# Patient Record
Sex: Male | Born: 1940 | Race: White | Hispanic: No | Marital: Married | Smoking: Former smoker
Health system: Southern US, Community
[De-identification: ages and names within clinical notes are randomized; demographics above are authoritative.]

## PROBLEM LIST (undated history)

## (undated) DIAGNOSIS — K602 Anal fissure, unspecified: Secondary | ICD-10-CM

## (undated) DIAGNOSIS — N209 Urinary calculus, unspecified: Secondary | ICD-10-CM

## (undated) DIAGNOSIS — I519 Heart disease, unspecified: Secondary | ICD-10-CM

## (undated) DIAGNOSIS — E109 Type 1 diabetes mellitus without complications: Secondary | ICD-10-CM

## (undated) DIAGNOSIS — I10 Essential (primary) hypertension: Secondary | ICD-10-CM

## (undated) DIAGNOSIS — G4730 Sleep apnea, unspecified: Secondary | ICD-10-CM

## (undated) DIAGNOSIS — T7840XA Allergy, unspecified, initial encounter: Secondary | ICD-10-CM

## (undated) DIAGNOSIS — N62 Hypertrophy of breast: Secondary | ICD-10-CM

## (undated) DIAGNOSIS — E785 Hyperlipidemia, unspecified: Secondary | ICD-10-CM

## (undated) DIAGNOSIS — K760 Fatty (change of) liver, not elsewhere classified: Secondary | ICD-10-CM

## (undated) DIAGNOSIS — M1990 Unspecified osteoarthritis, unspecified site: Secondary | ICD-10-CM

## (undated) DIAGNOSIS — R609 Edema, unspecified: Secondary | ICD-10-CM

## (undated) DIAGNOSIS — M109 Gout, unspecified: Secondary | ICD-10-CM

## (undated) DIAGNOSIS — Z860100 Personal history of colon polyps, unspecified: Secondary | ICD-10-CM

## (undated) DIAGNOSIS — E291 Testicular hypofunction: Secondary | ICD-10-CM

## (undated) DIAGNOSIS — K222 Esophageal obstruction: Secondary | ICD-10-CM

## (undated) DIAGNOSIS — R011 Cardiac murmur, unspecified: Secondary | ICD-10-CM

## (undated) DIAGNOSIS — E1042 Type 1 diabetes mellitus with diabetic polyneuropathy: Secondary | ICD-10-CM

## (undated) DIAGNOSIS — M545 Low back pain: Secondary | ICD-10-CM

## (undated) DIAGNOSIS — K219 Gastro-esophageal reflux disease without esophagitis: Secondary | ICD-10-CM

## (undated) DIAGNOSIS — G4733 Obstructive sleep apnea (adult) (pediatric): Secondary | ICD-10-CM

## (undated) DIAGNOSIS — N259 Disorder resulting from impaired renal tubular function, unspecified: Secondary | ICD-10-CM

## (undated) DIAGNOSIS — D649 Anemia, unspecified: Secondary | ICD-10-CM

## (undated) HISTORY — PX: POLYPECTOMY: SHX149

## (undated) HISTORY — DX: Type 1 diabetes mellitus without complications: E10.9

## (undated) HISTORY — DX: Unspecified osteoarthritis, unspecified site: M19.90

## (undated) HISTORY — DX: Personal history of colonic polyps: Z86.010

## (undated) HISTORY — DX: Sleep apnea, unspecified: G47.30

## (undated) HISTORY — DX: Cardiac murmur, unspecified: R01.1

## (undated) HISTORY — DX: Anal fissure, unspecified: K60.2

## (undated) HISTORY — DX: Fatty (change of) liver, not elsewhere classified: K76.0

## (undated) HISTORY — DX: Heart disease, unspecified: I51.9

## (undated) HISTORY — DX: Low back pain: M54.5

## (undated) HISTORY — DX: Allergy, unspecified, initial encounter: T78.40XA

## (undated) HISTORY — DX: Gout, unspecified: M10.9

## (undated) HISTORY — DX: Anemia, unspecified: D64.9

## (undated) HISTORY — DX: Edema, unspecified: R60.9

## (undated) HISTORY — DX: Type 1 diabetes mellitus with diabetic polyneuropathy: E10.42

## (undated) HISTORY — DX: Testicular hypofunction: E29.1

## (undated) HISTORY — DX: Essential (primary) hypertension: I10

## (undated) HISTORY — DX: Disorder resulting from impaired renal tubular function, unspecified: N25.9

## (undated) HISTORY — DX: Hyperlipidemia, unspecified: E78.5

## (undated) HISTORY — PX: OTHER SURGICAL HISTORY: SHX169

## (undated) HISTORY — DX: Urinary calculus, unspecified: N20.9

## (undated) HISTORY — DX: Hypertrophy of breast: N62

## (undated) HISTORY — DX: Personal history of colon polyps, unspecified: Z86.0100

## (undated) HISTORY — DX: Obstructive sleep apnea (adult) (pediatric): G47.33

## (undated) HISTORY — DX: Morbid (severe) obesity due to excess calories: E66.01

## (undated) HISTORY — DX: Esophageal obstruction: K22.2

## (undated) HISTORY — DX: Gastro-esophageal reflux disease without esophagitis: K21.9

---
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
## 1978-10-23 HISTORY — PX: GANGLION CYST EXCISION: SHX1691

## 1997-09-22 HISTORY — PX: OTHER SURGICAL HISTORY: SHX169

## 1999-05-25 ENCOUNTER — Other Ambulatory Visit: Admission: RE | Admit: 1999-05-25 | Discharge: 1999-05-25 | Payer: Self-pay | Admitting: Gastroenterology

## 1999-05-25 ENCOUNTER — Encounter (INDEPENDENT_AMBULATORY_CARE_PROVIDER_SITE_OTHER): Payer: Self-pay | Admitting: Specialist

## 2001-03-04 ENCOUNTER — Encounter: Payer: Self-pay | Admitting: Gastroenterology

## 2001-03-04 ENCOUNTER — Encounter (INDEPENDENT_AMBULATORY_CARE_PROVIDER_SITE_OTHER): Payer: Self-pay

## 2001-03-04 ENCOUNTER — Ambulatory Visit (HOSPITAL_COMMUNITY): Admission: RE | Admit: 2001-03-04 | Discharge: 2001-03-04 | Payer: Self-pay | Admitting: Obstetrics & Gynecology

## 2001-03-04 HISTORY — PX: FLEXIBLE SIGMOIDOSCOPY: SHX1649

## 2001-03-04 LAB — HM SIGMOIDOSCOPY

## 2002-11-10 ENCOUNTER — Encounter: Payer: Self-pay | Admitting: Endocrinology

## 2002-11-10 ENCOUNTER — Encounter: Admission: RE | Admit: 2002-11-10 | Discharge: 2002-11-10 | Payer: Self-pay | Admitting: Endocrinology

## 2003-03-19 HISTORY — PX: OTHER SURGICAL HISTORY: SHX169

## 2003-05-12 ENCOUNTER — Encounter: Payer: Self-pay | Admitting: Pulmonary Disease

## 2003-06-30 ENCOUNTER — Encounter: Payer: Self-pay | Admitting: Pulmonary Disease

## 2004-04-13 HISTORY — PX: OTHER SURGICAL HISTORY: SHX169

## 2004-08-18 ENCOUNTER — Encounter: Payer: Self-pay | Admitting: Gastroenterology

## 2004-08-18 HISTORY — PX: COLONOSCOPY: SHX174

## 2004-09-23 ENCOUNTER — Ambulatory Visit: Payer: Self-pay | Admitting: Endocrinology

## 2005-02-07 ENCOUNTER — Ambulatory Visit: Payer: Self-pay | Admitting: Endocrinology

## 2005-02-21 ENCOUNTER — Ambulatory Visit (HOSPITAL_COMMUNITY): Admission: RE | Admit: 2005-02-21 | Discharge: 2005-02-21 | Payer: Self-pay | Admitting: Endocrinology

## 2005-02-24 ENCOUNTER — Ambulatory Visit: Payer: Self-pay | Admitting: Endocrinology

## 2005-03-30 ENCOUNTER — Ambulatory Visit: Payer: Self-pay | Admitting: Pulmonary Disease

## 2005-04-06 ENCOUNTER — Ambulatory Visit: Payer: Self-pay | Admitting: Endocrinology

## 2005-04-13 ENCOUNTER — Ambulatory Visit: Payer: Self-pay

## 2005-07-05 ENCOUNTER — Ambulatory Visit: Payer: Self-pay | Admitting: Endocrinology

## 2005-07-07 ENCOUNTER — Ambulatory Visit: Payer: Self-pay | Admitting: Endocrinology

## 2005-07-10 ENCOUNTER — Ambulatory Visit (HOSPITAL_COMMUNITY): Admission: RE | Admit: 2005-07-10 | Discharge: 2005-07-10 | Payer: Self-pay | Admitting: Endocrinology

## 2005-07-26 ENCOUNTER — Ambulatory Visit: Payer: Self-pay | Admitting: Internal Medicine

## 2005-07-26 ENCOUNTER — Ambulatory Visit: Payer: Self-pay | Admitting: Endocrinology

## 2005-08-04 ENCOUNTER — Encounter: Payer: Self-pay | Admitting: Internal Medicine

## 2005-08-04 ENCOUNTER — Ambulatory Visit (HOSPITAL_COMMUNITY): Admission: RE | Admit: 2005-08-04 | Discharge: 2005-08-04 | Payer: Self-pay | Admitting: Internal Medicine

## 2005-08-04 ENCOUNTER — Encounter (INDEPENDENT_AMBULATORY_CARE_PROVIDER_SITE_OTHER): Payer: Self-pay | Admitting: *Deleted

## 2005-08-04 ENCOUNTER — Ambulatory Visit: Payer: Self-pay | Admitting: Internal Medicine

## 2005-08-15 ENCOUNTER — Ambulatory Visit (HOSPITAL_COMMUNITY): Admission: RE | Admit: 2005-08-15 | Discharge: 2005-08-15 | Payer: Self-pay | Admitting: Internal Medicine

## 2005-08-15 ENCOUNTER — Encounter: Admission: RE | Admit: 2005-08-15 | Discharge: 2005-08-15 | Payer: Self-pay | Admitting: Internal Medicine

## 2005-11-27 ENCOUNTER — Ambulatory Visit: Payer: Self-pay | Admitting: Endocrinology

## 2005-11-28 ENCOUNTER — Ambulatory Visit: Payer: Self-pay | Admitting: Endocrinology

## 2006-02-20 HISTORY — PX: ELECTROCARDIOGRAM: SHX264

## 2006-03-30 ENCOUNTER — Ambulatory Visit: Payer: Self-pay | Admitting: Endocrinology

## 2006-04-03 ENCOUNTER — Ambulatory Visit: Payer: Self-pay | Admitting: Endocrinology

## 2006-06-22 ENCOUNTER — Ambulatory Visit: Payer: Self-pay | Admitting: Endocrinology

## 2006-06-27 ENCOUNTER — Ambulatory Visit: Payer: Self-pay | Admitting: Endocrinology

## 2006-11-22 ENCOUNTER — Ambulatory Visit: Payer: Self-pay | Admitting: Endocrinology

## 2006-11-22 LAB — CONVERTED CEMR LAB: Hgb A1c MFr Bld: 8.6 % — ABNORMAL HIGH (ref 4.6–6.0)

## 2006-11-26 ENCOUNTER — Ambulatory Visit: Payer: Self-pay | Admitting: Endocrinology

## 2007-02-14 ENCOUNTER — Ambulatory Visit: Payer: Self-pay | Admitting: Endocrinology

## 2007-02-14 LAB — CONVERTED CEMR LAB: Uric Acid, Serum: 7.3 mg/dL — ABNORMAL HIGH (ref 2.4–7.0)

## 2007-02-19 ENCOUNTER — Ambulatory Visit: Payer: Self-pay | Admitting: Endocrinology

## 2007-03-13 ENCOUNTER — Ambulatory Visit: Payer: Self-pay | Admitting: Gastroenterology

## 2007-04-02 ENCOUNTER — Encounter: Payer: Self-pay | Admitting: Gastroenterology

## 2007-04-02 ENCOUNTER — Ambulatory Visit (HOSPITAL_COMMUNITY): Admission: RE | Admit: 2007-04-02 | Discharge: 2007-04-02 | Payer: Self-pay | Admitting: Gastroenterology

## 2007-04-12 ENCOUNTER — Ambulatory Visit: Payer: Self-pay | Admitting: Gastroenterology

## 2007-05-20 ENCOUNTER — Encounter: Payer: Self-pay | Admitting: Endocrinology

## 2007-05-20 DIAGNOSIS — E785 Hyperlipidemia, unspecified: Secondary | ICD-10-CM

## 2007-05-20 DIAGNOSIS — I10 Essential (primary) hypertension: Secondary | ICD-10-CM | POA: Insufficient documentation

## 2007-05-20 DIAGNOSIS — M109 Gout, unspecified: Secondary | ICD-10-CM | POA: Insufficient documentation

## 2007-05-20 DIAGNOSIS — E109 Type 1 diabetes mellitus without complications: Secondary | ICD-10-CM

## 2007-05-20 HISTORY — DX: Hyperlipidemia, unspecified: E78.5

## 2007-05-20 HISTORY — DX: Gout, unspecified: M10.9

## 2007-05-20 HISTORY — DX: Type 1 diabetes mellitus without complications: E10.9

## 2007-05-22 ENCOUNTER — Ambulatory Visit: Payer: Self-pay | Admitting: Endocrinology

## 2007-05-22 LAB — CONVERTED CEMR LAB
ALT: 30 units/L (ref 0–53)
BUN: 29 mg/dL — ABNORMAL HIGH (ref 6–23)
Bilirubin Urine: NEGATIVE
Bilirubin, Direct: 0.1 mg/dL (ref 0.0–0.3)
CO2: 32 meq/L (ref 19–32)
Calcium: 9 mg/dL (ref 8.4–10.5)
Cholesterol: 131 mg/dL (ref 0–200)
Eosinophils Absolute: 0.1 10*3/uL (ref 0.0–0.6)
Eosinophils Relative: 1.5 % (ref 0.0–5.0)
GFR calc Af Amer: 65 mL/min
GFR calc non Af Amer: 54 mL/min
Glucose, Bld: 190 mg/dL — ABNORMAL HIGH (ref 70–99)
HDL: 28.9 mg/dL — ABNORMAL LOW (ref >39.0)
Hemoglobin, Urine: NEGATIVE
Hemoglobin: 13.6 g/dL (ref 13.0–17.0)
Leukocytes, UA: NEGATIVE
Lymphocytes Relative: 35.4 % (ref 12.0–46.0)
MCV: 92.8 fL (ref 78.0–100.0)
Monocytes Absolute: 0.3 10*3/uL (ref 0.2–0.7)
Monocytes Relative: 7.5 % (ref 3.0–11.0)
Neutro Abs: 2.2 10*3/uL (ref 1.4–7.7)
PSA: 1.32 ng/mL (ref 0.10–4.00)
Platelets: 117 10*3/uL — ABNORMAL LOW (ref 150–400)
Potassium: 4.2 meq/L (ref 3.5–5.1)
TSH: 4.13 microintl units/mL (ref 0.35–5.50)
Total CHOL/HDL Ratio: 4.5
Total Protein: 6.2 g/dL (ref 6.0–8.3)
Triglycerides: 152 mg/dL — ABNORMAL HIGH (ref 0–149)
Uric Acid, Serum: 6.6 mg/dL (ref 2.4–7.0)
Urine Glucose: NEGATIVE mg/dL

## 2007-05-27 ENCOUNTER — Ambulatory Visit: Payer: Self-pay | Admitting: Endocrinology

## 2007-07-05 ENCOUNTER — Ambulatory Visit: Payer: Self-pay | Admitting: Endocrinology

## 2007-07-08 ENCOUNTER — Ambulatory Visit: Payer: Self-pay | Admitting: Endocrinology

## 2007-07-22 ENCOUNTER — Ambulatory Visit: Payer: Self-pay | Admitting: Pulmonary Disease

## 2007-08-03 ENCOUNTER — Emergency Department (HOSPITAL_COMMUNITY): Admission: EM | Admit: 2007-08-03 | Discharge: 2007-08-03 | Payer: Self-pay | Admitting: Emergency Medicine

## 2007-09-02 ENCOUNTER — Ambulatory Visit: Payer: Self-pay | Admitting: Pulmonary Disease

## 2007-10-21 ENCOUNTER — Ambulatory Visit: Payer: Self-pay | Admitting: Endocrinology

## 2007-10-21 LAB — CONVERTED CEMR LAB
Alkaline Phosphatase: 76 units/L (ref 39–117)
BUN: 31 mg/dL — ABNORMAL HIGH (ref 6–23)
Basophils Relative: 0.1 % (ref 0.0–1.0)
Bilirubin, Direct: 0.2 mg/dL (ref 0.0–0.3)
CO2: 29 meq/L (ref 19–32)
Creatinine, Ser: 2.1 mg/dL — ABNORMAL HIGH (ref 0.4–1.5)
Eosinophils Relative: 1.4 % (ref 0.0–5.0)
Glucose, Bld: 294 mg/dL — ABNORMAL HIGH (ref 70–99)
HCT: 42.2 % (ref 39.0–52.0)
HDL: 29.8 mg/dL — ABNORMAL LOW (ref >39.0)
Hemoglobin: 14.6 g/dL (ref 13.0–17.0)
Leukocytes, UA: NEGATIVE
Lymphocytes Relative: 38.7 % (ref 12.0–46.0)
Monocytes Absolute: 0.4 10*3/uL (ref 0.2–0.7)
Monocytes Relative: 7 % (ref 3.0–11.0)
Neutro Abs: 2.7 10*3/uL (ref 1.4–7.7)
Neutrophils Relative %: 52.8 % (ref 43.0–77.0)
Nitrite: NEGATIVE
Potassium: 3.7 meq/L (ref 3.5–5.1)
RDW: 13.3 % (ref 11.5–14.6)
Specific Gravity, Urine: >=1.03 (ref 1.000–1.03)
Total Bilirubin: 0.9 mg/dL (ref 0.3–1.2)
Total Protein, Urine: NEGATIVE mg/dL
Total Protein: 6.6 g/dL (ref 6.0–8.3)
Urobilinogen, UA: 0.2 (ref 0.0–1.0)
VLDL: 42 mg/dL — ABNORMAL HIGH (ref 0–40)
Vitamin B-12: 261 pg/mL (ref 211–911)
WBC: 5.3 10*3/uL (ref 4.5–10.5)
pH: 5.5 (ref 5.0–8.0)

## 2007-10-23 ENCOUNTER — Ambulatory Visit: Payer: Self-pay | Admitting: Endocrinology

## 2007-10-23 DIAGNOSIS — M545 Low back pain, unspecified: Secondary | ICD-10-CM

## 2007-10-23 HISTORY — DX: Low back pain, unspecified: M54.50

## 2007-11-04 ENCOUNTER — Ambulatory Visit: Payer: Self-pay | Admitting: Pulmonary Disease

## 2007-11-04 ENCOUNTER — Telehealth: Payer: Self-pay | Admitting: Endocrinology

## 2007-11-04 DIAGNOSIS — G4733 Obstructive sleep apnea (adult) (pediatric): Secondary | ICD-10-CM

## 2007-11-04 HISTORY — DX: Obstructive sleep apnea (adult) (pediatric): G47.33

## 2007-11-05 ENCOUNTER — Telehealth (INDEPENDENT_AMBULATORY_CARE_PROVIDER_SITE_OTHER): Payer: Self-pay | Admitting: *Deleted

## 2007-11-05 ENCOUNTER — Encounter: Payer: Self-pay | Admitting: Internal Medicine

## 2007-11-29 ENCOUNTER — Encounter: Payer: Self-pay | Admitting: Endocrinology

## 2008-01-21 ENCOUNTER — Ambulatory Visit: Payer: Self-pay | Admitting: Endocrinology

## 2008-01-21 LAB — CONVERTED CEMR LAB: Hgb A1c MFr Bld: 7.7 % — ABNORMAL HIGH (ref 4.6–6.0)

## 2008-05-12 ENCOUNTER — Ambulatory Visit: Payer: Self-pay | Admitting: Endocrinology

## 2008-05-12 DIAGNOSIS — N259 Disorder resulting from impaired renal tubular function, unspecified: Secondary | ICD-10-CM

## 2008-05-12 DIAGNOSIS — D61818 Other pancytopenia: Secondary | ICD-10-CM | POA: Insufficient documentation

## 2008-05-12 DIAGNOSIS — R609 Edema, unspecified: Secondary | ICD-10-CM

## 2008-05-12 HISTORY — DX: Disorder resulting from impaired renal tubular function, unspecified: N25.9

## 2008-05-12 HISTORY — DX: Edema, unspecified: R60.9

## 2008-05-12 LAB — CONVERTED CEMR LAB
CO2: 31 meq/L (ref 19–32)
Chloride: 100 meq/L (ref 96–112)
Glucose, Bld: 195 mg/dL — ABNORMAL HIGH (ref 70–99)
Hgb A1c MFr Bld: 7.9 % — ABNORMAL HIGH (ref 4.6–6.0)
Sodium: 139 meq/L (ref 135–145)

## 2008-06-23 ENCOUNTER — Ambulatory Visit: Payer: Self-pay | Admitting: Pulmonary Disease

## 2008-06-30 ENCOUNTER — Telehealth: Payer: Self-pay | Admitting: Endocrinology

## 2008-07-22 ENCOUNTER — Encounter: Payer: Self-pay | Admitting: Pulmonary Disease

## 2008-07-22 ENCOUNTER — Ambulatory Visit (HOSPITAL_BASED_OUTPATIENT_CLINIC_OR_DEPARTMENT_OTHER): Admission: RE | Admit: 2008-07-22 | Discharge: 2008-07-22 | Payer: Self-pay | Admitting: Pulmonary Disease

## 2008-08-04 ENCOUNTER — Ambulatory Visit: Payer: Self-pay | Admitting: Pulmonary Disease

## 2008-08-05 ENCOUNTER — Telehealth (INDEPENDENT_AMBULATORY_CARE_PROVIDER_SITE_OTHER): Payer: Self-pay | Admitting: *Deleted

## 2008-08-11 ENCOUNTER — Ambulatory Visit: Payer: Self-pay | Admitting: Pulmonary Disease

## 2008-08-12 ENCOUNTER — Telehealth (INDEPENDENT_AMBULATORY_CARE_PROVIDER_SITE_OTHER): Payer: Self-pay | Admitting: *Deleted

## 2008-09-01 ENCOUNTER — Ambulatory Visit: Payer: Self-pay | Admitting: Endocrinology

## 2008-10-22 ENCOUNTER — Ambulatory Visit: Payer: Self-pay | Admitting: Endocrinology

## 2008-10-22 LAB — CONVERTED CEMR LAB
Albumin: 3.9 g/dL (ref 3.5–5.2)
Alkaline Phosphatase: 70 units/L (ref 39–117)
BUN: 34 mg/dL — ABNORMAL HIGH (ref 6–23)
Basophils Absolute: 0 10*3/uL (ref 0.0–0.1)
Bilirubin Urine: NEGATIVE
Cholesterol: 130 mg/dL (ref 0–200)
Eosinophils Absolute: 0.1 10*3/uL (ref 0.0–0.7)
Eosinophils Relative: 2 % (ref 0.0–5.0)
GFR calc Af Amer: 56 mL/min
GFR calc non Af Amer: 46 mL/min
HCT: 41.2 % (ref 39.0–52.0)
HDL: 29.5 mg/dL — ABNORMAL LOW (ref >39.0)
Hemoglobin, Urine: NEGATIVE
Hgb A1c MFr Bld: 7.1 % — ABNORMAL HIGH (ref 4.6–6.0)
Ketones, ur: NEGATIVE mg/dL
LDL Cholesterol: 68 mg/dL (ref 0–99)
MCHC: 34 g/dL (ref 30.0–36.0)
MCV: 92.9 fL (ref 78.0–100.0)
Microalb Creat Ratio: 3.2 mg/g (ref 0.0–30.0)
Monocytes Absolute: 0.3 10*3/uL (ref 0.1–1.0)
Platelets: 106 10*3/uL — ABNORMAL LOW (ref 150–400)
Potassium: 4.3 meq/L (ref 3.5–5.1)
RDW: 14.3 % (ref 11.5–14.6)
Sodium: 141 meq/L (ref 135–145)
Total Protein, Urine: NEGATIVE mg/dL
Urine Glucose: NEGATIVE mg/dL
VLDL: 32 mg/dL (ref 0–40)

## 2008-10-27 ENCOUNTER — Ambulatory Visit: Payer: Self-pay | Admitting: Endocrinology

## 2008-10-27 DIAGNOSIS — R011 Cardiac murmur, unspecified: Secondary | ICD-10-CM | POA: Insufficient documentation

## 2008-10-27 HISTORY — DX: Cardiac murmur, unspecified: R01.1

## 2008-11-03 ENCOUNTER — Ambulatory Visit: Payer: Self-pay

## 2008-11-03 ENCOUNTER — Encounter: Payer: Self-pay | Admitting: Endocrinology

## 2008-11-04 DIAGNOSIS — I519 Heart disease, unspecified: Secondary | ICD-10-CM

## 2008-11-04 HISTORY — DX: Heart disease, unspecified: I51.9

## 2008-12-09 ENCOUNTER — Telehealth: Payer: Self-pay | Admitting: Endocrinology

## 2009-02-15 ENCOUNTER — Telehealth: Payer: Self-pay | Admitting: Endocrinology

## 2009-02-19 ENCOUNTER — Ambulatory Visit: Payer: Self-pay | Admitting: Pulmonary Disease

## 2009-04-20 ENCOUNTER — Ambulatory Visit: Payer: Self-pay | Admitting: Diagnostic Radiology

## 2009-04-20 ENCOUNTER — Inpatient Hospital Stay (HOSPITAL_COMMUNITY): Admission: EM | Admit: 2009-04-20 | Discharge: 2009-04-22 | Payer: Self-pay | Admitting: Internal Medicine

## 2009-04-20 ENCOUNTER — Telehealth (INDEPENDENT_AMBULATORY_CARE_PROVIDER_SITE_OTHER): Payer: Self-pay | Admitting: *Deleted

## 2009-04-20 ENCOUNTER — Ambulatory Visit: Payer: Self-pay | Admitting: Internal Medicine

## 2009-04-20 ENCOUNTER — Encounter: Payer: Self-pay | Admitting: Emergency Medicine

## 2009-04-30 ENCOUNTER — Encounter: Payer: Self-pay | Admitting: Endocrinology

## 2009-05-03 ENCOUNTER — Ambulatory Visit: Payer: Self-pay | Admitting: Endocrinology

## 2009-05-03 DIAGNOSIS — R55 Syncope and collapse: Secondary | ICD-10-CM | POA: Insufficient documentation

## 2009-05-20 ENCOUNTER — Telehealth (INDEPENDENT_AMBULATORY_CARE_PROVIDER_SITE_OTHER): Payer: Self-pay | Admitting: *Deleted

## 2009-05-24 ENCOUNTER — Encounter: Payer: Self-pay | Admitting: Cardiovascular Disease

## 2009-05-24 ENCOUNTER — Ambulatory Visit: Payer: Self-pay

## 2009-06-04 ENCOUNTER — Telehealth: Payer: Self-pay | Admitting: Endocrinology

## 2009-06-30 ENCOUNTER — Ambulatory Visit: Payer: Self-pay | Admitting: Endocrinology

## 2009-06-30 LAB — CONVERTED CEMR LAB: Hgb A1c MFr Bld: 7.3 % — ABNORMAL HIGH (ref 4.6–6.5)

## 2009-08-17 ENCOUNTER — Encounter (INDEPENDENT_AMBULATORY_CARE_PROVIDER_SITE_OTHER): Payer: Self-pay | Admitting: *Deleted

## 2009-09-03 ENCOUNTER — Telehealth: Payer: Self-pay | Admitting: Endocrinology

## 2009-09-10 ENCOUNTER — Ambulatory Visit: Payer: Self-pay | Admitting: Gastroenterology

## 2009-09-10 DIAGNOSIS — E538 Deficiency of other specified B group vitamins: Secondary | ICD-10-CM | POA: Insufficient documentation

## 2009-09-10 HISTORY — DX: Morbid (severe) obesity due to excess calories: E66.01

## 2009-09-13 ENCOUNTER — Encounter: Payer: Self-pay | Admitting: Endocrinology

## 2009-09-13 ENCOUNTER — Telehealth (INDEPENDENT_AMBULATORY_CARE_PROVIDER_SITE_OTHER): Payer: Self-pay | Admitting: *Deleted

## 2009-09-24 ENCOUNTER — Ambulatory Visit: Payer: Self-pay | Admitting: Gastroenterology

## 2009-09-24 HISTORY — PX: COLONOSCOPY: SHX174

## 2009-09-24 LAB — HM COLONOSCOPY

## 2009-09-30 ENCOUNTER — Ambulatory Visit: Payer: Self-pay | Admitting: Endocrinology

## 2009-09-30 DIAGNOSIS — L259 Unspecified contact dermatitis, unspecified cause: Secondary | ICD-10-CM | POA: Insufficient documentation

## 2009-09-30 DIAGNOSIS — H9190 Unspecified hearing loss, unspecified ear: Secondary | ICD-10-CM | POA: Insufficient documentation

## 2009-11-29 ENCOUNTER — Telehealth: Payer: Self-pay | Admitting: Endocrinology

## 2009-12-29 ENCOUNTER — Ambulatory Visit: Payer: Self-pay | Admitting: Endocrinology

## 2009-12-29 DIAGNOSIS — C8409 Mycosis fungoides, extranodal and solid organ sites: Secondary | ICD-10-CM | POA: Insufficient documentation

## 2009-12-29 LAB — CONVERTED CEMR LAB
ALT: 32 units/L (ref 0–53)
Alkaline Phosphatase: 87 units/L (ref 39–117)
Basophils Relative: 0.2 % (ref 0.0–3.0)
Bilirubin Urine: NEGATIVE
Bilirubin, Direct: 0.2 mg/dL (ref 0.0–0.3)
Calcium: 9.1 mg/dL (ref 8.4–10.5)
Cholesterol: 121 mg/dL (ref 0–200)
Creatinine, Ser: 1.5 mg/dL (ref 0.4–1.5)
Eosinophils Relative: 1.7 % (ref 0.0–5.0)
GFR calc non Af Amer: 49.44 mL/min (ref >60)
Lymphocytes Relative: 33.3 % (ref 12.0–46.0)
Microalb Creat Ratio: 19 mg/g (ref 0.0–30.0)
Microalb, Ur: 1.2 mg/dL (ref 0.0–1.9)
Neutrophils Relative %: 60.1 % (ref 43.0–77.0)
Nitrite: NEGATIVE
PSA: 1.94 ng/mL (ref 0.10–4.00)
RBC: 4.85 M/uL (ref 4.22–5.81)
Specific Gravity, Urine: 1.02 (ref 1.000–1.030)
Total CHOL/HDL Ratio: 3
Total Protein: 6.8 g/dL (ref 6.0–8.3)
Triglycerides: 219 mg/dL — ABNORMAL HIGH (ref 0.0–149.0)
Uric Acid, Serum: 4.8 mg/dL (ref 4.0–7.8)
Urobilinogen, UA: 0.2 (ref 0.0–1.0)
VLDL: 43.8 mg/dL — ABNORMAL HIGH (ref 0.0–40.0)
WBC: 4 10*3/uL — ABNORMAL LOW (ref 4.5–10.5)
pH: 5.5 (ref 5.0–8.0)

## 2010-01-06 ENCOUNTER — Ambulatory Visit: Payer: Self-pay | Admitting: Pulmonary Disease

## 2010-01-13 ENCOUNTER — Encounter: Payer: Self-pay | Admitting: Pulmonary Disease

## 2010-02-07 ENCOUNTER — Telehealth: Payer: Self-pay | Admitting: Endocrinology

## 2010-03-10 ENCOUNTER — Telehealth: Payer: Self-pay | Admitting: Endocrinology

## 2010-03-30 ENCOUNTER — Ambulatory Visit: Payer: Self-pay | Admitting: Endocrinology

## 2010-03-30 LAB — CONVERTED CEMR LAB: Hgb A1c MFr Bld: 9.1 % — ABNORMAL HIGH (ref 4.6–6.5)

## 2010-04-28 ENCOUNTER — Encounter: Payer: Self-pay | Admitting: Endocrinology

## 2010-06-29 ENCOUNTER — Ambulatory Visit: Payer: Self-pay | Admitting: Endocrinology

## 2010-06-29 LAB — CONVERTED CEMR LAB: Hgb A1c MFr Bld: 8.2 % — ABNORMAL HIGH (ref 4.6–6.5)

## 2010-09-28 ENCOUNTER — Ambulatory Visit: Payer: Self-pay | Admitting: Endocrinology

## 2010-11-22 NOTE — Progress Notes (Signed)
  Phone Note Refill Request Message from:  Fax from Pharmacy on November 29, 2009 11:48 AM  Refills Requested: Medication #1:  CELEBREX 200 MG  CAPS TAKE 1 by mouth QD   Dosage confirmed as above?Dosage Confirmed Initial call taken by: Gardenia Phlegm CMA,  November 29, 2009 11:49 AM    Prescriptions: CELEBREX 200 MG  CAPS (CELECOXIB) TAKE 1 by mouth QD  #90 x 1   Entered by:   Gardenia Phlegm CMA   Authorized by:   Donavan Foil MD   Signed by:   Gardenia Phlegm CMA on 11/29/2009   Method used:   Electronically to        [facility] (mail-order)             ,          Ph: [phone]       Fax: [phone]   [id number]

## 2010-11-22 NOTE — Miscellaneous (Signed)
Summary: ONO shows no signficant desaturation.   Clinical Lists Changes  ONO off cpap shows low sat of 86%, but only 24 seconds the entire night with sat less than 88%.  Does not need nocturnal oxygen  Appended Document: ONO shows no signficant desaturation. please let pt know that he does not need oxygen at night.  Appended Document: ONO shows no signficant desaturation. [id number].   Appended Document: ONO shows no signficant desaturation. called and spoke with pt.  pt aware of ONO results and that pt does not need o2 at night while sleeping.

## 2010-11-22 NOTE — Assessment & Plan Note (Signed)
Summary: rov for osa   CC:  Pt is here for an overdue f/u appt.  Pt states he is currently not wearing his cpap machine.  Pt states he "sleeps better without it."  Pt states he got a strap to use with his mask so he doesn't open his mouth while he sleeps at night however pt states strap doesn't help- still opening mouth while slepeing.  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  He was started on cpap, but is overdue for f/u.  He has not been wearing cpap for multiple reasons, the primary being an inability to tolerate full face mask and a chin strap with nasal masks.  He feels that he has given it his best, and this is not a viable therapy for him.  In fact, he feels he sleeps better with the device than without it.  Medications Prior to Update: 1)  Bayer Low Strength 81 Mg  Tbec (Aspirin) .... Take 1 Tablet By Mouth Once A Day 2)  Allopurinol 300 Mg  Tabs (Allopurinol) .... Take 1 By Mouth Qd 3)  Celebrex 200 Mg  Caps (Celecoxib) .... Take 1 By Mouth Qd 4)  Uroxatral 10 Mg  Tb24 (Alfuzosin Hcl) .... Take 1 By Mouth Qd 5)  Amidrine 325-65-100 Mg  Caps (Apap-Isometheptene-Dichloral) .... Use Prn 6)  Restasis 0.05 %  Emul (Cyclosporine) .... Use 1 Drop in Each Eye Bid 7)  Omeprazole 20 Mg  Cpdr (Omeprazole) .... Take 1 By Mouth Once Daily As Needed 8)  Insulin Pen Needles, and Size, Brand .... Tid 9)  Humulin N 100 Unit/ml  Susp (Insulin Isophane Human) .... 30 Units At Bedtime 10)  Freestyle Lancets  Misc (Lancets) .... Use 1 As Directed Qid 11)  Crestor 40 Mg Tabs (Rosuvastatin Calcium) .... Qhs 12)  Humalog Kwikpen 100 Unit/ml Soln (Insulin Lispro (Human)) .... Tid (Qac) 40-20-35 Units 13)  Furosemide 40 Mg Tabs (Furosemide) .Marland Kitchen.. 1 Qd 14)  Clobetasol Propionate 0.05 % Crea (Clobetasol Propionate) .... Two Times A Day 15)  Onetouch Ultra Test  Strp (Glucose Blood) .... Once Daily, and Lancets 250.01  Allergies (verified): 1)  ! Lipitor 2)  ! *  Niaspan 3)  ! * Actos  Review of Systems      See HPI  Vital Signs:  Patient profile:   70 year old male Height:      72 inches Weight:      278.13 pounds BMI:     37.86 O2 Sat:      97 % on Room air Temp:     97.6 degrees F oral Pulse rate:   98 / minute BP sitting:   100 / 60  (left arm) Cuff size:   large  Vitals Entered By: Matthew Folks LPN (March 17, 624THL 075-GRM AM)  O2 Flow:  Room air CC: Pt is here for an overdue f/u appt.  Pt states he is currently not wearing his cpap machine.  Pt states he "sleeps better without it."  Pt states he got a strap to use with his mask so he doesn't open his mouth while he sleeps at night however pt states strap doesn't help- still opening mouth while slepeing.   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 30 pounds since his original sleep study.  He has been intolerant of cpap, and this does not seem like a viable therapy for him.  At least mild disease does not put him at any significant cardiac risk.  At this point, will discontinue cpap and check ONO to make sure he is not having any prolonged nocturnal desaturations.  I have also discussed  with him other treatment options such as surgery and dental appliance.  Ultimately, his best treatment is weight loss.  Time spent with pt today was 8min.  Other Orders: Est. Patient Level III [id number]) DME Referral (DME)  Patient Instructions: 1)  work on weight loss, stay off back while sleeping 2)  will check overnight oxygen level off cpap 3)  think about other options of surgery or dental appliance.  Let me know what you think.    Appended Document: rov for osa received ONO from [facility].  Put in KC's very important look at folder.     Appended Document: rov for osa megan, there is no ONO on this pt in my folder.  Appended Document: rov for osa see clinical list update.

## 2010-11-22 NOTE — Assessment & Plan Note (Signed)
Summary: 3 MTH YEARLY---STC   Vital Signs:  Patient profile:   70 year old male Height:      72 inches (182.88 cm) Weight:      275.50 pounds (125.23 kg) O2 Sat:      97 % on Room air Temp:     96.9 degrees F (36.06 degrees C) oral Pulse rate:   83 / minute BP sitting:   120 / 72  (left arm) Cuff size:   large  Vitals Entered By: Gardenia Phlegm RMA (December 29, 2009 8:21 AM)  O2 Flow:  Room air CC: 3 month yearly/ pt states he had an EKG done in July or August/ CF Is Patient Diabetic? Yes   CC:  3 month yearly/ pt states he had an EKG done in July or August/ CF.  History of Present Illness: here for regular wellness examination.  He's feeling pretty well in general, and does not drink or smoke.   Current Medications (verified): 1)  Bayer Low Strength 81 Mg  Tbec (Aspirin) .... Take 1 Tablet By Mouth Once A Day 2)  Allopurinol 300 Mg  Tabs (Allopurinol) .... Take 1 By Mouth Qd 3)  Celebrex 200 Mg  Caps (Celecoxib) .... Take 1 By Mouth Qd 4)  Uroxatral 10 Mg  Tb24 (Alfuzosin Hcl) .... Take 1 By Mouth Qd 5)  Amidrine 325-65-100 Mg  Caps (Apap-Isometheptene-Dichloral) .... Use Prn 6)  Restasis 0.05 %  Emul (Cyclosporine) .... Use 1 Drop in Each Eye Bid 7)  Omeprazole 20 Mg  Cpdr (Omeprazole) .... Take 1 By Mouth Once Daily As Needed 8)  Insulin Pen Needles, and Size, Brand .... Tid 9)  Humulin N 100 Unit/ml  Susp (Insulin Isophane Human) .... 30 Units At Bedtime 10)  Glucose Test Strips, and Brand, and Lancets .... Two Times A Day 250.01 11)  Freestyle Lancets  Misc (Lancets) .... Use 1 As Directed Qid 12)  Crestor 40 Mg Tabs (Rosuvastatin Calcium) .... Qhs 13)  Humalog Kwikpen 100 Unit/ml Soln (Insulin Lispro (Human)) .... Tid (Qac) 30-10-35 Units 14)  Furosemide 40 Mg Tabs (Furosemide) .Marland Kitchen.. 1 Qd 15)  Losartan Potassium 25 Mg Tabs (Losartan Potassium) .Marland Kitchen.. 1 Qd 16)  Clobetasol Propionate 0.05 % Crea (Clobetasol Propionate) .... Two Times A Day  Allergies (verified): 1)  !  Lipitor 2)  ! * Niaspan 3)  ! * Actos  Family History: Reviewed history from 10/23/2007 and no changes required. one brother had colon cancer.  One brother had lung cancer  Social History: Reviewed history from 09/10/2009 and no changes required. married works [employer] Patient has never smoked.  Alcohol Use - no Illicit Drug Use - no  Review of Systems  The patient denies fever, vision loss, chest pain, dyspnea on exertion, prolonged cough, headaches, abdominal pain, melena, hematochezia, severe indigestion/heartburn, hematuria, and depression.         he has lost a few lbs, due to his efforts.  hearing aids work well  Physical Exam  General:  morbidly obese.   Head:  head: no deformity eyes: no periorbital swelling, no proptosis external nose and ears are normal mouth: no lesion seen Ears:  bilat hearing aids Neck:  Supple without thyroid enlargement or tenderness.  Lungs:  Clear to auscultation bilaterally. Normal respiratory effort.  Heart:  Regular rate and rhythm without murmurs or gallops noted. Normal S1,S2.   Abdomen:  abdomen is soft, nontender.  no hepatosplenomegaly.   not distended.  no hernia  Rectal:  normal external  and internal exam.  heme neg  Prostate:  Normal size prostate without masses or tenderness.  Msk:  muscle bulk and strength are grossly normal.  no obvious joint swelling.  gait is normal and steady  Neurologic:  cn 2-12 grossly intact.   readily moves all 4's.   sensation is intact to touch on the feet  Skin:  normal texture and temp.  no rash.  not diaphoretic  Cervical Nodes:  No significant adenopathy.  Psych:  Alert and cooperative; normal mood and affect; normal attention span and concentration.   Additional Exam:  SEPARATE EVALUATION FOLLOWS--EACH PROBLEM HERE IS NEW, NOT RESPONDING TO TREATMENT, OR POSES SIGNIFICANT RISK TO THE PATIENT'S HEALTH: HISTORY OF THE PRESENT ILLNESS: pt says the losartan makes him dizzy no cbg record, but  states cbg's are sometimes as high as 300 before lunch or in the afternoon [place] reviewed and up to date today REVIEW OF SYSTEMS: denies syncope and hypoglycemia PHYSICAL EXAMINATION: dorsalis pedis intact bilat.  no carotid bruit se vs page no deformity.  no ulcer on the feet.  feet are of normal color and temp.  no edema LAB/XRAY RESULTS: a1c=9.5 IMPRESSION: htn, ? overcontrolled dm, needs increased rx PLAN: see instruction sheet   Impression & Recommendations:  Problem # 1:  ROUTINE GENERAL MEDICAL EXAM@HEALTH  CARE FACL (ICD-V70.0)  Medications Added to Medication List This Visit: 1)  Humalog Kwikpen 100 Unit/ml Soln (Insulin lispro (human)) .... Tid (qac) 40-20-35 units 2)  Clobetasol Propionate 0.05 % Crea (Clobetasol propionate) .... Two times a day 3)  Onetouch Ultra Test Strp (Glucose blood) .... Once daily, and lancets 250.01  Other Orders: TLB-Lipid Panel (80061-LIPID) TLB-BMP (Basic Metabolic Panel-BMET) (99991111) TLB-CBC Platelet - w/Differential (85025-CBCD) TLB-Hepatic/Liver Function Pnl (80076-HEPATIC) TLB-TSH (Thyroid Stimulating Hormone) (84443-TSH) TLB-A1C / Hgb A1C (Glycohemoglobin) (83036-A1C) TLB-Microalbumin/Creat Ratio, Urine (82043-MALB) TLB-PSA (Prostate Specific Antigen) (84153-PSA) TLB-Udip w/ Micro (81001-URINE) TLB-Uric Acid, Blood (84550-URIC) Est. Patient Level III [id number]) Est. Patient 65& > [id number])   Patient Instructions: 1)  ok to stay-off losartan 2)  please reschedule appointment with dr clance 3)  check your blood sugar 1 time a day.  vary the time of day when you check, between before the 3 meals, and at bedtime.  also check if you have symptoms of your blood sugar being too high or too low.  please keep a record of the readings and bring it to your next appointment here.  please call us sooner if you are having low blood sugar episodes. 4)  tests are being ordered for you today.  a few days after the test(s),  please call [phone] to hear your test results. 5)  Please schedule a follow-up appointment in 3 months. 6)  (update: i left message on phone-tree:  increase humalog to (just before each meal) 40-20-35 units). 7)  we discussed the recommendations of the preventive services task force

## 2010-11-22 NOTE — Letter (Signed)
Summary: [facility]   Imported By: Phillis Knack 05/05/2010 08:54:51  _____________________________________________________________________  External Attachment:    Type:   Image     Comment:   External Document

## 2010-11-22 NOTE — Assessment & Plan Note (Signed)
Summary: 3 MO FU-OYU   Vital Signs:  Patient profile:   70 year old male Height:      72 inches (182.88 cm) Weight:      276.75 pounds (125.80 kg) BMI:     37.67 O2 Sat:      94 % on Room air Temp:     97.3 degrees F (36.28 degrees C) oral Pulse rate:   80 / minute BP sitting:   110 / 74  (left arm) Cuff size:   large  Vitals Entered By: Rebeca Alert MA (June 29, 2010 8:02 AM)  O2 Flow:  Room air CC: 3 month F/U/aj Is Patient Diabetic? Yes   CC:  3 month F/U/aj.  History of Present Illness: no cbg record, but states cbg's are low before lunch.  it is highest in the afternoon, but seldom is over 170.  it is higher at hs than is am.  pt states he feels well in general.   Current Medications (verified): 1)  Bayer Low Strength 81 Mg  Tbec (Aspirin) .... Take 1 Tablet By Mouth Once A Day 2)  Allopurinol 300 Mg  Tabs (Allopurinol) .... Take 1 By Mouth Qd 3)  Celebrex 200 Mg  Caps (Celecoxib) .... Take 1 By Mouth Qd 4)  Uroxatral 10 Mg  Tb24 (Alfuzosin Hcl) .... Take 1 By Mouth Qd 5)  Restasis 0.05 %  Emul (Cyclosporine) .... Use 1 Drop in Each Eye Bid 6)  Omeprazole 20 Mg  Cpdr (Omeprazole) .... Take 1 By Mouth Once Daily As Needed 7)  Insulin Pen Needles, and Size, Brand .... Tid 8)  Humulin N 100 Unit/ml  Susp (Insulin Isophane Human) .... 35 Units At Bedtime 9)  Freestyle Lancets  Misc (Lancets) .... Use 1 As Directed Qid 10)  Crestor 40 Mg Tabs (Rosuvastatin Calcium) .... Qhs 11)  Humalog Kwikpen 100 Unit/ml Soln (Insulin Lispro (Human)) .... Tid (Qac) 47-20-40 Units 12)  Furosemide 40 Mg Tabs (Furosemide) .Marland Kitchen.. 1 Qd 13)  Clobetasol Propionate 0.05 % Crea (Clobetasol Propionate) .... Two Times A Day As Needed 14)  Onetouch Ultra Test  Strp (Glucose Blood) .... Once Daily, and Lancets 250.01 15)  Fish Oil 1200 Mg Caps (Omega-3 Fatty Acids) .... Take 1 By Mouth Once Daily 16)  Centrum Silver  Tabs (Multiple Vitamins-Minerals) .... Take 1 By Mouth Once Daily  Allergies  (verified): 1)  ! Lipitor 2)  ! * Niaspan 3)  ! * Actos  Review of Systems  The patient denies syncope.    Physical Exam  Pulses:  dorsalis pedis intact bilat.  Extremities:  no deformity.  no ulcer on the feet.  feet are of normal color and temp.   1+ right pedal edema and 1+ left pedal edema.   Neurologic:  sensation is intact to touch on the feet. Additional Exam:  Hemoglobin A1C       [H]  8.2 %   Impression & Recommendations:  Problem # 1:  DIABETES MELLITUS, TYPE I (ICD-250.01) needs increased rx  Medications Added to Medication List This Visit: 1)  Humalog Kwikpen 100 Unit/ml Soln (Insulin lispro (human)) .... Tid (qac) 47-20-40 units 2)  Humalog Kwikpen 100 Unit/ml Soln (Insulin lispro (human)) .... Tid (qac) 45-30-40 units 3)  Humalog Kwikpen 100 Unit/ml Soln (Insulin lispro (human)) .... Tid (qac) 45-30-50 units  Other Orders: TLB-A1C / Hgb A1C (Glycohemoglobin) (83036-A1C) Est. Patient Level III [id number])  Patient Instructions: 1)  blood tests are being ordered for you today.  please  call [phone] to hear your test results. 2)  pending the test results, please change humalog to (just before each meal) 45-30-40 units.  3)  same nph: 35 units at bedtime. 4)  Please schedule a follow-up appointment in 3 months. 5)  (update: i left message on phone-tree:  increase supper humalog to 50 units).

## 2010-11-22 NOTE — Progress Notes (Signed)
  Phone Note Refill Request Message from:  Fax from Pharmacy on February 07, 2010 8:14 AM  Refills Requested: Medication #1:  UROXATRAL 10 MG  TB24 TAKE 1 by mouth QD   Dosage confirmed as above?Dosage Confirmed pt would like RX to go to [facility].  Initial call taken by: Gardenia Phlegm RMA,  February 07, 2010 8:15 AM    Prescriptions: UROXATRAL 10 MG  TB24 (ALFUZOSIN HCL) TAKE 1 by mouth QD  #90 x 2   Entered by:   Gardenia Phlegm RMA   Authorized by:   Donavan Foil MD   Signed by:   Gardenia Phlegm RMA on 02/07/2010   Method used:   Electronically to        [facility] (mail-order)             ,          Ph: [phone]       Fax: [phone]   [id number]

## 2010-11-22 NOTE — Progress Notes (Signed)
  Phone Note Refill Request  on Mar 10, 2010 10:56 AM  Refills Requested: Medication #1:  ALLOPURINOL 300 MG  TABS take 1 by mouth qd   Dosage confirmed as above?Dosage Confirmed Initial call taken by: Gardenia Phlegm RMA,  Mar 10, 2010 10:57 AM    Prescriptions: ALLOPURINOL 300 MG  TABS (ALLOPURINOL) take 1 by mouth qd  #90 x 3   Entered by:   Gardenia Phlegm RMA   Authorized by:   Donavan Foil MD   Signed by:   Gardenia Phlegm RMA on 03/10/2010   Method used:   Electronically to        [facility] (mail-order)             ,          Ph: [phone]       Fax: [phone]   [id number]

## 2010-11-22 NOTE — Assessment & Plan Note (Signed)
Summary: 3 mth fu  stc   Vital Signs:  Patient profile:   70 year old male Height:      72 inches (182.88 cm) Weight:      275.6 pounds (125.27 kg) O2 Sat:      97 % on Room air Temp:     97.5 degrees F (36.39 degrees C) oral Pulse rate:   96 / minute BP sitting:   120 / 68  (left arm) Cuff size:   large  Vitals Entered By: Tomma Lightning (March 30, 2010 8:44 AM)  O2 Flow:  Room air CC: 3 month follow-up Is Patient Diabetic? Yes Did you bring your meter with you today? No   CC:  3 month follow-up.  History of Present Illness: no cbg record, but states cbg's are only slightly improved.  it varies from 100-220.  it is still highest before lunch, and lowest at hs.  Current Medications (verified): 1)  Bayer Low Strength 81 Mg  Tbec (Aspirin) .... Take 1 Tablet By Mouth Once A Day 2)  Allopurinol 300 Mg  Tabs (Allopurinol) .... Take 1 By Mouth Qd 3)  Celebrex 200 Mg  Caps (Celecoxib) .... Take 1 By Mouth Qd 4)  Uroxatral 10 Mg  Tb24 (Alfuzosin Hcl) .... Take 1 By Mouth Qd 5)  Restasis 0.05 %  Emul (Cyclosporine) .... Use 1 Drop in Each Eye Bid 6)  Omeprazole 20 Mg  Cpdr (Omeprazole) .... Take 1 By Mouth Once Daily As Needed 7)  Insulin Pen Needles, and Size, Brand .... Tid 8)  Humulin N 100 Unit/ml  Susp (Insulin Isophane Human) .... 30 Units At Bedtime 9)  Freestyle Lancets  Misc (Lancets) .... Use 1 As Directed Qid 10)  Crestor 40 Mg Tabs (Rosuvastatin Calcium) .... Qhs 11)  Humalog Kwikpen 100 Unit/ml Soln (Insulin Lispro (Human)) .... Tid (Qac) 40-20-35 Units 12)  Furosemide 40 Mg Tabs (Furosemide) .Marland Kitchen.. 1 Qd 13)  Clobetasol Propionate 0.05 % Crea (Clobetasol Propionate) .... Two Times A Day As Needed 14)  Onetouch Ultra Test  Strp (Glucose Blood) .... Once Daily, and Lancets 250.01 15)  Fish Oil 1200 Mg Caps (Omega-3 Fatty Acids) .... Take 1 By Mouth Once Daily 16)  Centrum Silver  Tabs (Multiple Vitamins-Minerals) .... Take 1 By Mouth Once Daily  Allergies (verified): 1)  !  Lipitor 2)  ! * Niaspan 3)  ! * Actos  Past History:  Past Medical History: Last updated: 09/08/2009 Colonic polyps, hx of Gout Hyperlipidemia SMOKER (QUIT 1984) DM Neuropathy Hepatic Steatosis Urolithiasis Hypogonadism Spinal OA Anal Fissure  EGD (04/02/2007) Gynecomastia DM Peripheral Neuropathy OBSTRUCTIVE SLEEP APNEA (ICD-327.23) BACK PAIN, LUMBAR (ICD-724.2) ROUTINE GENERAL MEDICAL EXAM@HEALTH  CARE FACL (ICD-V70.0) UNSPECIFIED ANEMIA (ICD-285.9) SPECIAL SCREENING MALIGNANT NEOPLASM OF PROSTATE (ICD-V76.44) HYPERTENSION (ICD-401.9) HYPERLIPIDEMIA (ICD-272.4) GOUT (ICD-274.9) DIABETES MELLITUS, TYPE I (ICD-250.01) COLONIC POLYPS, HX OF (ICD-V12.72) esophageal stricture duodenitis  Review of Systems  The patient denies hypoglycemia.    Physical Exam  General:  morbidly obese.  no distress  Skin:  insulin injection sites at anterior abdomen are normal, except for a few ecchymoses. Additional Exam:  Hemoglobin A1C       [H]  9.1 %    Impression & Recommendations:  Problem # 1:  DIABETES MELLITUS, TYPE I (ICD-250.01) needs increased rx  Medications Added to Medication List This Visit: 1)  Humulin N 100 Unit/ml Susp (Insulin isophane human) .... 35 units at bedtime 2)  Humalog Kwikpen 100 Unit/ml Soln (Insulin lispro (human)) .... Tid (qac)  55-25-40 units 3)  Clobetasol Propionate 0.05 % Crea (Clobetasol propionate) .... Two times a day as needed 4)  Fish Oil 1200 Mg Caps (Omega-3 fatty acids) .... Take 1 by mouth once daily 5)  Centrum Silver Tabs (Multiple vitamins-minerals) .... Take 1 by mouth once daily  Other Orders: Diabetic Clinic Referral (Diabetic) TLB-A1C / Hgb A1C (Glycohemoglobin) (83036-A1C) Est. Patient Level III [id number])  Patient Instructions: 1)  refer dietician.  you will be called with a day and time for an appointment. 2)  blood tests are being ordered for you today.  please call [phone] to hear your test results. 3)  pending the test  results, please increase humalog to (just before each meal) 55-25-40 units.  4)  increase nph to 35 units at bedtime. 5)  Please schedule a follow-up appointment in 3 months. 6)  (update: i left message on phone-tree:  rx as we discussed, except ret 1 month).

## 2010-11-24 NOTE — Assessment & Plan Note (Signed)
Summary: 3 mth fu--stc   Vital Signs:  Patient profile:   70 year old male Height:      72 inches (182.88 cm) Weight:      275.25 pounds (125.11 kg) BMI:     37.47 O2 Sat:      97 % on Room air Temp:     97.7 degrees F (36.50 degrees C) oral Pulse rate:   72 / minute Pulse rhythm:   regular BP sitting:   116 / 72  (left arm) Cuff size:   large  Vitals Entered By: Rebeca Alert CMA (AAMA) (September 28, 2010 8:00 AM)  O2 Flow:  Room air CC: 3 month F/U/aj Is Patient Diabetic? Yes   CC:  3 month F/U/aj.  History of Present Illness: no cbg record, but states cbg's have no pattern throughout the day.  most are in the mid-100's.  he says there is no trend throughout the day.  pt states he feels well in general.  he finds that he has to reduce an insulin dose if he has to be active.     Current Medications (verified): 1)  Bayer Low Strength 81 Mg  Tbec (Aspirin) .... Take 1 Tablet By Mouth Once A Day 2)  Allopurinol 300 Mg  Tabs (Allopurinol) .... Take 1 By Mouth Qd 3)  Celebrex 200 Mg  Caps (Celecoxib) .... Take 1 By Mouth Qd 4)  Uroxatral 10 Mg  Tb24 (Alfuzosin Hcl) .... Take 1 By Mouth Qd 5)  Restasis 0.05 %  Emul (Cyclosporine) .... Use 1 Drop in Each Eye Bid 6)  Omeprazole 20 Mg  Cpdr (Omeprazole) .... Take 1 By Mouth Once Daily As Needed 7)  Insulin Pen Needles, and Size, Brand .... Tid 8)  Humulin N 100 Unit/ml  Susp (Insulin Isophane Human) .... 35 Units At Bedtime 9)  Crestor 40 Mg Tabs (Rosuvastatin Calcium) .... Qhs 10)  Humalog Kwikpen 100 Unit/ml Soln (Insulin Lispro (Human)) .... Tid (Qac) 45-30-50 Units 11)  Furosemide 40 Mg Tabs (Furosemide) .Marland Kitchen.. 1 Qd 12)  Clobetasol Propionate 0.05 % Crea (Clobetasol Propionate) .... Two Times A Day As Needed 13)  Onetouch Ultra Test  Strp (Glucose Blood) .... Once Daily, and Lancets 250.01 14)  Fish Oil 1200 Mg Caps (Omega-3 Fatty Acids) .... Take 1 By Mouth Once Daily 15)  Centrum Silver  Tabs (Multiple Vitamins-Minerals) ....  Take 1 By Mouth Once Daily 16)  Onetouch Ultrasoft Lancets  Misc (Lancets) .... Use As Directed Dx 250.01  Allergies (verified): 1)  ! Lipitor 2)  ! * Niaspan 3)  ! * Actos  Past History:  Past Medical History: Last updated: 09/08/2009 Colonic polyps, hx of Gout Hyperlipidemia SMOKER (QUIT 1984) DM Neuropathy Hepatic Steatosis Urolithiasis Hypogonadism Spinal OA Anal Fissure  EGD (04/02/2007) Gynecomastia DM Peripheral Neuropathy OBSTRUCTIVE SLEEP APNEA (ICD-327.23) BACK PAIN, LUMBAR (ICD-724.2) ROUTINE GENERAL MEDICAL EXAM@HEALTH  CARE FACL (ICD-V70.0) UNSPECIFIED ANEMIA (ICD-285.9) SPECIAL SCREENING MALIGNANT NEOPLASM OF PROSTATE (ICD-V76.44) HYPERTENSION (ICD-401.9) HYPERLIPIDEMIA (ICD-272.4) GOUT (ICD-274.9) DIABETES MELLITUS, TYPE I (ICD-250.01) COLONIC POLYPS, HX OF (ICD-V12.72) esophageal stricture duodenitis  Review of Systems  The patient denies hypoglycemia.    Physical Exam  General:  morbidly obese.  no distress  Extremities:  1+ right pedal edema and 1+ left pedal edema.   Additional Exam:  Hemoglobin A1C       [H]  8.9 %    Impression & Recommendations:  Problem # 1:  DIABETES MELLITUS, TYPE I (ICD-250.01) needs increased rx  Other Orders: TLB-A1C /  Hgb A1C (Glycohemoglobin) (83036-A1C) Est. Patient Level III [id number]  Patient Instructions: 1)  blood tests are being ordered for you today.  please call [phone] to hear your test results. 2)  pending the test results, please change humalog to (just before each meal) 45-30-50 units.  if activity is anticipated, reduce that insulin by 10 units.   3)  same nph: 35 units at bedtime. 4)  Please schedule a physical appointment in 3 months. 5)  (update: i left message on phone-tree:  increase humalog to (just before each meal) 55-40-60 units).   Orders Added: 1)  TLB-A1C / Hgb A1C (Glycohemoglobin) [83036-A1C] 2)  Est. Patient Level III [id number]   Immunization History:  Influenza Immunization  History:    Influenza:  historical (06/23/2010)   Immunization History:  Influenza Immunization History:    Influenza:  Historical (06/23/2010)

## 2011-01-17 ENCOUNTER — Encounter: Payer: Self-pay | Admitting: Endocrinology

## 2011-01-17 ENCOUNTER — Ambulatory Visit (INDEPENDENT_AMBULATORY_CARE_PROVIDER_SITE_OTHER): Payer: BC Managed Care – PPO | Admitting: Endocrinology

## 2011-01-17 ENCOUNTER — Other Ambulatory Visit (INDEPENDENT_AMBULATORY_CARE_PROVIDER_SITE_OTHER): Payer: BC Managed Care – PPO

## 2011-01-17 DIAGNOSIS — I10 Essential (primary) hypertension: Secondary | ICD-10-CM

## 2011-01-17 DIAGNOSIS — D61818 Other pancytopenia: Secondary | ICD-10-CM

## 2011-01-17 DIAGNOSIS — E785 Hyperlipidemia, unspecified: Secondary | ICD-10-CM

## 2011-01-17 DIAGNOSIS — Z79899 Other long term (current) drug therapy: Secondary | ICD-10-CM

## 2011-01-17 DIAGNOSIS — N259 Disorder resulting from impaired renal tubular function, unspecified: Secondary | ICD-10-CM

## 2011-01-17 DIAGNOSIS — M109 Gout, unspecified: Secondary | ICD-10-CM

## 2011-01-17 DIAGNOSIS — E109 Type 1 diabetes mellitus without complications: Secondary | ICD-10-CM

## 2011-01-17 DIAGNOSIS — Z125 Encounter for screening for malignant neoplasm of prostate: Secondary | ICD-10-CM

## 2011-01-17 LAB — URINALYSIS, ROUTINE W REFLEX MICROSCOPIC
Bilirubin Urine: NEGATIVE
Ketones, ur: NEGATIVE
Specific Gravity, Urine: 1.025 (ref 1.000–1.030)
Urobilinogen, UA: 0.2 (ref 0.0–1.0)

## 2011-01-17 LAB — CBC WITH DIFFERENTIAL/PLATELET
Basophils Relative: 0.2 % (ref 0.0–3.0)
Eosinophils Relative: 1.4 % (ref 0.0–5.0)
HCT: 44.3 % (ref 39.0–52.0)
Lymphs Abs: 1.7 10*3/uL (ref 0.7–4.0)
MCV: 91.2 fl (ref 78.0–100.0)
Monocytes Absolute: 0.3 10*3/uL (ref 0.1–1.0)
Neutro Abs: 2.5 10*3/uL (ref 1.4–7.7)
RBC: 4.86 Mil/uL (ref 4.22–5.81)
WBC: 4.5 10*3/uL (ref 4.5–10.5)

## 2011-01-17 LAB — LIPID PANEL
Cholesterol: 121 mg/dL (ref 0–200)
LDL Cholesterol: 58 mg/dL (ref 0–99)

## 2011-01-17 LAB — HEPATIC FUNCTION PANEL
ALT: 29 U/L (ref 0–53)
AST: 22 U/L (ref 0–37)
Albumin: 4 g/dL (ref 3.5–5.2)

## 2011-01-17 LAB — URIC ACID: Uric Acid, Serum: 5.3 mg/dL (ref 4.0–7.8)

## 2011-01-17 LAB — TSH: TSH: 3.38 u[IU]/mL (ref 0.35–5.50)

## 2011-01-17 LAB — MICROALBUMIN / CREATININE URINE RATIO
Creatinine,U: 203.7 mg/dL
Microalb Creat Ratio: 1.4 mg/g (ref 0.0–30.0)

## 2011-01-17 MED ORDER — INSULIN LISPRO 100 UNIT/ML ~~LOC~~ SOLN: SUBCUTANEOUS | Status: DC

## 2011-01-17 NOTE — Progress Notes (Signed)
Subjective:    Patient ID: [name], male    DOB: 08-16-1941, 70 y.o.   MRN: [id number]  HPI here for regular wellness examination.  He's feeling pretty well in general, and says chronic med probs are stable, except as noted below Past Medical History  Diagnosis Date  . Hepatic steatosis   . Urolithiasis   . Hypogonadism male   . Gynecomastia   . Anal fissure   . DM type 1 with diabetic peripheral neuropathy   . Personal history of colonic polyps   . Anemia, unspecified   . Duodenitis   . Esophageal stricture   . Arthritis     Spinal OA  . DIABETES MELLITUS, TYPE I 05/20/2007  . HYPERLIPIDEMIA 05/20/2007  . 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 Morbid obesity 09/10/2009  . OBSTRUCTIVE SLEEP APNEA 11/04/2007  . DIASTOLIC DYSFUNCTION [id number]  . RENAL INSUFFICIENCY 05/12/2008  . BACK PAIN, LUMBAR 10/23/2007  . CARDIAC MURMUR, SYSTOLIC [id number]  . Edema 05/12/2008   No past surgical history on file.  reports that he quit smoking about 28 years ago. He does not have any smokeless tobacco history on file. He reports that he does not drink alcohol or use illicit drugs. family history includes Cancer in his brothers. Allergies  Allergen Reactions  . Atorvastatin   . Niacin     REACTION: Severe heartburn  . Pioglitazone     REACTION: Edema      Review of Systems  Constitutional: Negative for fever.       He has lost a few lbs, due to his efforts  HENT: Positive for congestion. Negative for hearing loss.   Eyes: Negative for visual disturbance.  Respiratory: Negative for cough and shortness of breath.   Cardiovascular: Negative for chest pain.  Gastrointestinal: Negative for abdominal pain and anal bleeding.  Genitourinary: Negative for dysuria and decreased urine volume.  Musculoskeletal: Negative for arthralgias.  Skin: Negative for rash.  Neurological: Negative for syncope and headaches.  Hematological: Bruises/bleeds easily.  Psychiatric/Behavioral: Negative for dysphoric mood. The  patient is not nervous/anxious.        Objective:   Physical Exam VS: see vs page GEN: no distress HEAD: head: no deformity eyes: no periorbital swelling, no proptosis external nose and ears are normal mouth: no lesion seen NECK: supple, thyroid is not enlarged CHEST WALL: no deformity BREASTS:  No gynecomastia CV: reg rate and rhythm, no murmur ABD: abdomen is soft, nontender.  no hepatosplenomegaly.  not distended.  no hernia RECTAL: normal external and internal exam.  heme neg. PROSTATE:  Normal size.  No nodule MUSCULOSKELETAL: muscle bulk and strength are grossly normal.  no obvious joint swelling.  gait is normal and steady PULSES:  no carotid bruit NEURO:  cn 2-12 grossly intact.   readily moves all 4's.   SKIN:  Normal texture and temperature.  No rash or suspicious lesion is visible. There are a few ecchymoses at the insulin injection sites at the anterior abdomen.   NODES:  None palpable at the neck PSYCH: alert, oriented x3.  Does not appear anxious nor depressed.    SEPARATE EVALUATION FOLLOWS--EACH PROBLEM HERE IS NEW, NOT RESPONDING TO TREATMENT, OR POSES SIGNIFICANT RISK TO THE PATIENT'S HEALTH: HISTORY OF THE PRESENT ILLNESS: Pt says he could not tolerate increased humalog at breakfast, so he decreased back to 47-25-50.  It is highest before supper.  pt states he feels well in general. PAST MEDICAL HISTORY reviewed and up to date today REVIEW OF SYSTEMS: Denies loc  PHYSICAL EXAMINATION: GENERAL: no distress.  obese Pulses: dorsalis pedis intact bilat.   Extremities: no deformity.  no ulcer on the feet.  feet are of normal color and temp.  There is 1+ bilat leg edema Neuro: sensation is intact to touch on the feet, but decreased from normal LAB/XRAY RESULTS: a1c is noted IMPRESSION: Dm, needs increased rx, but rx is currently limited by hypoglycemia, so we'll have to first eliminate hypoglycemia PLAN: See instruction sheet Assessment & Plan:  Wellness  visit today

## 2011-01-17 NOTE — Patient Instructions (Addendum)
blood tests are being ordered for you today.  please call [phone] to hear your test results. pending the test results, please take humalog 45-30-50.  Continue nph, 35 units at bedtime. please consider these measures for your health:  minimize alcohol.  do not use tobacco products.  have a colonoscopy at least every 10 years from age 70.  keep firearms safely stored.  always use seat belts.  have working smoke alarms in your home.  see an eye doctor and dentist regularly.  never drive under the influence of alcohol or drugs (including prescription drugs).  those with fair skin should take precautions against the sun. please let me know what your wishes would be, if artificial life support measures should become necessary.  it is critically important to prevent falling down (keep floor areas well-lit, dry, and free of loose objects). Please return in 3 months. (update: i left message on phone-tree:  Increase lunch humalog to 30 units).

## 2011-01-24 LAB — GLUCOSE, CAPILLARY: Glucose-Capillary: 194 mg/dL — ABNORMAL HIGH (ref 70–99)

## 2011-01-29 ENCOUNTER — Other Ambulatory Visit: Payer: Self-pay | Admitting: Endocrinology

## 2011-01-29 LAB — DIFFERENTIAL
Basophils Absolute: 0 10*3/uL (ref 0.0–0.1)
Basophils Relative: 0 % (ref 0–1)
Lymphocytes Relative: 38 % (ref 12–46)
Neutro Abs: 2.4 10*3/uL (ref 1.7–7.7)
Neutrophils Relative %: 55 % (ref 43–77)

## 2011-01-29 LAB — BASIC METABOLIC PANEL
CO2: 25 mEq/L (ref 19–32)
Calcium: 8.2 mg/dL — ABNORMAL LOW (ref 8.4–10.5)
Creatinine, Ser: 1.23 mg/dL (ref 0.4–1.5)
GFR calc Af Amer: >60 mL/min (ref >60)
GFR calc non Af Amer: 59 mL/min — ABNORMAL LOW (ref >60)

## 2011-01-29 LAB — CBC
MCHC: 34.3 g/dL (ref 30.0–36.0)
RBC: 4.06 MIL/uL — ABNORMAL LOW (ref 4.22–5.81)
RDW: 14.5 % (ref 11.5–15.5)

## 2011-01-29 LAB — GLUCOSE, CAPILLARY: Glucose-Capillary: 192 mg/dL — ABNORMAL HIGH (ref 70–99)

## 2011-01-30 LAB — URINALYSIS, ROUTINE W REFLEX MICROSCOPIC
Ketones, ur: NEGATIVE mg/dL
Nitrite: NEGATIVE
Protein, ur: NEGATIVE mg/dL
Urobilinogen, UA: 0.2 mg/dL (ref 0.0–1.0)

## 2011-01-30 LAB — BASIC METABOLIC PANEL
BUN: 28 mg/dL — ABNORMAL HIGH (ref 6–23)
BUN: 46 mg/dL — ABNORMAL HIGH (ref 6–23)
Chloride: 105 mEq/L (ref 96–112)
GFR calc non Af Amer: 38 mL/min — ABNORMAL LOW (ref >60)
Glucose, Bld: 135 mg/dL — ABNORMAL HIGH (ref 70–99)
Glucose, Bld: 151 mg/dL — ABNORMAL HIGH (ref 70–99)
Potassium: 4 mEq/L (ref 3.5–5.1)
Potassium: 4.3 mEq/L (ref 3.5–5.1)
Sodium: 138 mEq/L (ref 135–145)

## 2011-01-30 LAB — POCT CARDIAC MARKERS
CKMB, poc: 1.2 ng/mL (ref 1.0–8.0)
Troponin i, poc: <0.05 ng/mL (ref 0.00–0.09)

## 2011-01-30 LAB — CBC
HCT: 38.1 % — ABNORMAL LOW (ref 39.0–52.0)
HCT: 46.7 % (ref 39.0–52.0)
Hemoglobin: 12.7 g/dL — ABNORMAL LOW (ref 13.0–17.0)
MCV: 90.9 fL (ref 78.0–100.0)
MCV: 91.3 fL (ref 78.0–100.0)
Platelets: 131 10*3/uL — ABNORMAL LOW (ref 150–400)
Platelets: 151 10*3/uL (ref 150–400)
RDW: 13.1 % (ref 11.5–15.5)
RDW: 14.1 % (ref 11.5–15.5)
WBC: 3.7 10*3/uL — ABNORMAL LOW (ref 4.0–10.5)

## 2011-01-30 LAB — DIFFERENTIAL
Basophils Absolute: 0 10*3/uL (ref 0.0–0.1)
Basophils Relative: 0 % (ref 0–1)
Eosinophils Absolute: 0 10*3/uL (ref 0.0–0.7)
Eosinophils Relative: 0 % (ref 0–5)
Lymphocytes Relative: 10 % — ABNORMAL LOW (ref 12–46)

## 2011-01-30 LAB — HEPATIC FUNCTION PANEL
ALT: 23 U/L (ref 0–53)
AST: 22 U/L (ref 0–37)
Alkaline Phosphatase: 69 U/L (ref 39–117)
Total Protein: 5.6 g/dL — ABNORMAL LOW (ref 6.0–8.3)

## 2011-01-30 LAB — TROPONIN I
Troponin I: 0.01 ng/mL (ref 0.00–0.06)
Troponin I: 0.01 ng/mL (ref 0.00–0.06)

## 2011-01-30 LAB — LIPID PANEL
Cholesterol: 109 mg/dL (ref 0–200)
HDL: 31 mg/dL — ABNORMAL LOW (ref >39)
Total CHOL/HDL Ratio: 3.5 RATIO
Triglycerides: 137 mg/dL (ref <150)

## 2011-01-30 LAB — GLUCOSE, CAPILLARY
Glucose-Capillary: 111 mg/dL — ABNORMAL HIGH (ref 70–99)
Glucose-Capillary: 112 mg/dL — ABNORMAL HIGH (ref 70–99)
Glucose-Capillary: 221 mg/dL — ABNORMAL HIGH (ref 70–99)
Glucose-Capillary: 84 mg/dL (ref 70–99)
Glucose-Capillary: 87 mg/dL (ref 70–99)

## 2011-01-30 LAB — CK TOTAL AND CKMB (NOT AT ARMC)
CK, MB: 1.3 ng/mL (ref 0.3–4.0)
Total CK: 77 U/L (ref 7–232)

## 2011-02-05 ENCOUNTER — Other Ambulatory Visit: Payer: Self-pay | Admitting: Endocrinology

## 2011-02-07 ENCOUNTER — Other Ambulatory Visit: Payer: Self-pay | Admitting: Endocrinology

## 2011-03-07 NOTE — Discharge Summary (Signed)
[name], [name] NO.:  [id number]   MEDICAL RECORD NO.:  [id number]          PATIENT TYPE:  INP   LOCATION:  [id number]                         FACILITY:  [facility]   PHYSICIAN:  Edythe Lynn, M.D.       DATE OF BIRTH:  [birth year]   DATE OF ADMISSION:  04/20/2009  DATE OF DISCHARGE:  04/22/2009                               DISCHARGE SUMMARY   PRIMARY CARE PHYSICIAN:  Sean A. Loanne Drilling, MD   DISCHARGE DIAGNOSES:  1. Syncope, most likely vasovagal.  2. Dehydration and hypotension, most likely secondary to medications,      resolved.  3. Diabetes mellitus.  4. Benign prostatic hyperplasia.  5. Acute renal failure, resolved.  6. Anemia and thrombocytopenia, needs outpatient workup.  7. Hyperlipidemia.   DISCHARGE MEDICATIONS:  1. Insulin NPH 10 units at bedtime.  2. Humalog 30 units before each meal.  3. Celebrex 200 mg daily.  4. Uroxatral 10 mg daily.  5. Crestor 40 mg daily.  6. Aspirin 81 mg daily.   CONDITION ON DISCHARGE:  [name] is discharged in good condition.  He  will follow up with his primary care physician, Dr. [name] for diabetes  regulation, a recheck of his blood pressure, and workup of his anemia  and mild thrombocytopenia.   PROCEDURES DONE ON THIS ADMISSION:  No procedures done.   CONSULTATIONS DONE THIS ADMISSION:  No consultations done.   HISTORY AND PHYSICAL:  Refer to the written H and P, done by Dr.  Asa Lente.  Briefly, [name] is a 70 year old gentleman with obesity,  diabetes, and hypertension presented from work after he passed out.  His  episode was very suggestive of vasovagal event with a lot of premonitory  symptoms and vagal symptoms afterwards.  It was noted that [name] was  dehydrated with elevated BUN and creatinine upon admission.  He was  admitted for further workup and observation.   HOSPITAL COURSE:  1. Syncope, most likely vasovagal:  The patient responded to      discontinuation of his ARB, Lasix, and  treatment with intravenous      fluids.  I have also discussed with the patient that he may need to      discontinue Uroxatral.  For now, we will leave the Uroxatral on,      but if the patient has any symptoms like dizziness or feeling like      near fainting, he should stop it and discuss with Dr. [name] an      alternate agent for BPH.  2. Diabetes mellitus:  The patient was hypoglycemic in the hospital on      his home doses of Humalog.  We have encouraged the patient to use      Humulin at bedtime and try to decrease his mealtime insulin.  We      will start him for now on 10 units of NPH at bedtime and 13 units      with each meal of Humalog, and Dr. [name] will help him further      adjust the  insulin.  3. Mild anemia with mild thrombocytopenia:  [name] is known to have      fatty liver disease.  His thrombocytopenia could be related to      that, but we would also recommend an outpatient workup with iron      panel, vitamin B12, and consideration of outpatient hematological      consultation.  If [name] has never had an EGD and colonoscopy,      he definitely needs due to his anemia.  4. Acute renal failure due to dehydration:  This has resolved with      intravenous fluids.  I would recommend holding the diuretic and the      ARB for now.      Edythe Lynn, M.D.  Electronically Signed     SL/MEDQ  D:  04/22/2009  T:  04/23/2009  Job:  [id number]   cc:   Hilliard Clark A. Loanne Drilling, MD

## 2011-03-07 NOTE — Procedures (Signed)
NAMEJITENDER, [name] NO.:  [id number]   MEDICAL RECORD NO.:  [id number]          PATIENT TYPE:  OUT   LOCATION:  SLEEP CENTER                 FACILITY:  [facility]   PHYSICIAN:  Kathee Delton, MD,FCCPDATE OF BIRTH:  01/12/1941   DATE OF STUDY:                            NOCTURNAL POLYSOMNOGRAM   REFERRING PHYSICIAN:   INDICATION FOR THE STUDY:  Hypersomnia with sleep apnea.  The patient  returns for pressure optimization.   EPWORTH SCORE:  4.   SLEEP ARCHITECTURE:  The patient had a total sleep time of 346 minutes  with variable slow-wave sleep and decreased quantity of REM.  Sleep-  onset latency was normal at 7.5 minutes and REM onset was normal at 105  minutes.  Sleep efficiency was good at 90%.   RESPIRATORY DATA:  The patient underwent a CPAP titration study with a  large ResMed Quattro full face mask.  The pressure was started at 5 cm  of water, and gradually increased as high as 16 cm for optimal control.  The patient really had fairly good control between 15 and 16, and was  noted to have an increased number of unscored central apneas at the  higher pressure.   OXYGEN DATA:  There was O2 desaturation as low as 77% prior to achieving  optimal CPAP pressure.   CARDIAC DATA:  No clinically significant arrhythmias were noted.   MOVEMENT/PARASOMNIA:  No significant leg jerks or abnormal behaviors  were seen.   IMPRESSION/RECOMMENDATION:  Good control of previously documented  obstructive sleep apnea with a large ResMed Quattro full face mask, and  a CPAP pressure of 15-16 cm of water.  I would recommend starting with  15 cm, since the patient did have pressure induced centrals to a greater  number at a pressure of 16.  The patient should also be encouraged to  work aggressively on weight loss.      Kathee Delton, MD,FCCP  [place], [place] Board of Sleep  Med[employer]  Electronically Signed     KMC/MEDQ  D:  08/04/2008 15:24:58  T:   08/05/2008 05:22:38  Job:  [id number]

## 2011-03-07 NOTE — Assessment & Plan Note (Signed)
[facility] OFFICE NOTE   NAME:Key, [name]                            MRN:          [id number]  DATE:03/13/2007                            DOB:          Nov 12, 1940    PROBLEM:  Pyrosis and dysphagia.   REASON:  [name] has returned for re-evaluation.  He has been  complaining of frequent pyrosis.  He also has intermittent dysphagia to  solids.  He has recently started over the counter Prilosec, though  symptoms persist. He does take Celebrex on a regular basis.   MEDICATIONS:  Include:  Baby aspirin, Lasix, allopurinol, insulin,  Celebrex, Vytorin, Diovan/hydrochlorothiazide, UroXatral and Prilosec.   ALLERGIES:  He is allergic to LIPITOR and ACTOS.   PHYSICAL EXAMINATION:  Pulse 72, blood pressure 110/76, weight 281.  HEENT: EOMI. PERRLA. Sclerae are anicteric.  Conjunctivae are pink.  NECK:  Supple without thyromegaly, adenopathy or carotid bruits.  CHEST:  Clear to auscultation and percussion without adventitious  sounds.  CARDIAC:  Regular rhythm; normal S1 S2.  There are no murmurs, gallops  or rubs.  ABDOMEN:  Bowel sounds are normoactive.  Abdomen is soft, non-tender and  non-distended.  There are no abdominal masses, tenderness, splenic  enlargement or hepatomegaly.  EXTREMITIES:  Full range of motion.  No cyanosis, clubbing or edema.  RECTAL:  Deferred.   IMPRESSION:  1. Gastroesophageal reflux disease.  Since this may be exacerbated by      his Celebrex.  2. Dysphagia - real early esophageal stricture.   RECOMMENDATIONS:  1. Begin omeprazole 20 mg daily.  2. Upper endoscopy with diltation as indicated.  3. Consider holding Celebrex pending response to his medications and      findings at endoscopy.     Sandy Salaam. Deatra Ina, MD,FACG  Electronically Signed    RDK/MedQ  DD: 03/13/2007  DT: 03/13/2007  Job #: [id number]

## 2011-03-07 NOTE — Consult Note (Signed)
[name], [name]                   ACCOUNT NO.:  [id number]   MEDICAL RECORD NO.:  [id number]          PATIENT TYPE:  EMS   LOCATION:  ED                           FACILITY[facility]   PHYSICIAN:  Hanley Ben, M.D.  DATE OF BIRTH:  19-Aug-1941   DATE OF CONSULTATION:  08/03/2007  DATE OF DISCHARGE:  08/03/2007                                 CONSULTATION   REASON FOR CONSULTATION:  Difficulty voiding.   HISTORY OF PRESENT ILLNESS:  The patient is a 70 year old male who  started having difficulty voiding about a week ago.  He was out of town  and he started having frequency, urgency and then the symptoms got  better, however, earlier this week he started again having frequency,  urgency, hesitancy, decreased force of urinary stream and voiding small  amount of urine at a time.  He went to the Urgent Care and was started  on Cipro for prostatitis and according to him, he was called back and  told that he also had a urinary tract infection.  I do not have the  results of the urine culture.  He has been on Uroxatral by Dr. Loanne Drilling.  He called me this morning to complain of frequency every 30 minutes and  voiding small amount of urine at a time, suprapubic discomfort and  rectal discomfort.  He then came to the emergency room for further  evaluation.   PAST MEDICAL HISTORY:  Positive for  1. Diabetes.  2. Hypercholesterolemia.  3. Hypertension.  4. Gout.   FAMILY HISTORY:  He does not know much about his father.  His mother  died in a motor vehicle accident about 30 years ago.  He has two half-  brothers and two full brothers.  One full brother died of lung cancer.  Another one had colon cancer.  One half brother had alcoholism and he  quit drinking about 10 months ago.   MEDICATIONS:  1. Cipro.  2. Insulin.  3. Diovan  4. Crestor.  5. Allopurinol.  6. Aspirin.  7. Lasix.  8. Uroxatral.   ALLERGIES:  NO KNOWN DRUG ALLERGIES   SOCIAL HISTORY:  He is married, does not  smoke nor drink.   REVIEW OF SYSTEMS:  As noted in the HPI and is otherwise negative.   PHYSICAL EXAMINATION:  GENERAL APPEARANCE:  This is a well-developed 54-  year-old male who is complaining of frequency, urgency, hesitancy,  straining on urination, voiding small amount of urine at a time and  suprapubic discomfort.  He is alert and oriented to time, place and  person.  VITAL SIGNS:  Blood pressure is 143/79, pulse 92, respirations 16,  temperature 97.9.  HEENT:  His head is normal.  He has pink conjunctivae.  Ears, nose and  throat are within normal limits.  NECK:  Supple.  No cervical adenopathy.  No thyromegaly.  CHEST:  Symmetrical.  Lungs are fully expanded and clear to percussion  and auscultation.  HEART:  Regular rhythm.  ABDOMEN:  Protuberant, tender in the suprapubic area.  He has no CVA  tenderness.  Liver, spleen and kidneys are not palpable and it is tender  in the suprapubic area.  He has no inguinal adenopathy.  No inguinal  hernia.  Bowel sounds are normal.  GU:  Penis is uncircumcised.  Meatus is normal.  Scrotum is normal.  He  has no hydrocele, no testicular mass.  Cords and epididymis are within  normal limits.  RECTAL:  Sphincter tone is normal.  Prostate is enlarged 40 grams  without any nodules and seminal vesicles are not palpable.   Urinalysis shows more than 100 mg of glucose, 15 mg of ketones, 100 mg  of protein, 1 mg of urobilinogen, nitrite negative, 3-6 WBCs and 0-2  RBCs, pH 6, specific gravity 1027.  PSA was 1.32 July 2008.   I inserted a #16 Foley catheter in the bladder and drained 300 mL of  urine.  He felt better after that, however, he had discomfort from the  Foley catheter.   IMPRESSION:  1. History of prostatitis.  2. Urinary retention.  3. Benign prostatic hypertrophy.  4. Diabetes.  5. Hypertension.  6. Gout.   PLAN:  I discussed with the patient and his wife about leaving the Foley  catheter indwelling, however, he feels that  he has too much discomfort  from the Foley and he would prefer to give it a trial at voiding.  I  have told him that if he is unable to void, he would need to return to  the emergency room for Foley catheter insertion.  He understands.  I  have asked him to continue Cipro, Uroxatral and I also gave him some  samples of Pyrelle to take one tablet three times a day.  If he voids  without any difficulty,  we will follow him up in the office      Hanley Ben, M.D.  Electronically Signed     MN/MEDQ  D:  08/03/2007  T:  08/04/2007  Job:  [id number]   cc:   Hilliard Clark A. Loanne Drilling, [place]  [place] [address]

## 2011-04-19 ENCOUNTER — Ambulatory Visit (INDEPENDENT_AMBULATORY_CARE_PROVIDER_SITE_OTHER): Payer: BC Managed Care – PPO | Admitting: Endocrinology

## 2011-04-19 ENCOUNTER — Other Ambulatory Visit (INDEPENDENT_AMBULATORY_CARE_PROVIDER_SITE_OTHER): Payer: BC Managed Care – PPO

## 2011-04-19 ENCOUNTER — Encounter: Payer: Self-pay | Admitting: Endocrinology

## 2011-04-19 VITALS — BP 122/62 | HR 95 | Temp 98.2°F | Ht 72.0 in | Wt 275.3 lb

## 2011-04-19 DIAGNOSIS — E109 Type 1 diabetes mellitus without complications: Secondary | ICD-10-CM

## 2011-04-19 LAB — HEMOGLOBIN A1C: Hgb A1c MFr Bld: 8.4 % — ABNORMAL HIGH (ref 4.6–6.5)

## 2011-04-19 MED ORDER — "PEN NEEDLES 5/16"" 31G X 8 MM MISC": 1.0000 | Freq: Four times a day (QID) | Status: DC

## 2011-04-19 NOTE — Progress Notes (Signed)
Subjective:    Patient ID: [name], male    DOB: 12-11-1940, 70 y.o.   MRN: [id number]  HPI Pt has declined bariatric surgery.  no cbg record, but states cbg's are well-controlled, except it is highest in am (often over 200--higher that at hs, despite no hs-snack). Pt states few weeks of slight nodule on the right foot.  No assoc pain. Past Medical History  Diagnosis Date  . Hepatic steatosis   . Urolithiasis   . Hypogonadism male   . Gynecomastia   . Anal fissure   . DM type 1 with diabetic peripheral neuropathy   . Personal history of colonic polyps   . Anemia, unspecified   . Duodenitis   . Esophageal stricture   . Arthritis     Spinal OA  . DIABETES MELLITUS, TYPE I 05/20/2007  . HYPERLIPIDEMIA 05/20/2007  . 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 Morbid obesity 09/10/2009  . OBSTRUCTIVE SLEEP APNEA 11/04/2007  . DIASTOLIC DYSFUNCTION 99991111  . RENAL INSUFFICIENCY 05/12/2008  . BACK PAIN, LUMBAR 10/23/2007  . CARDIAC MURMUR, SYSTOLIC [id number]  . Edema 05/12/2008    No past surgical history on file.  History   Social History  . Marital Status: Married    Spouse Name: N/A    Number of Children: N/A  . Years of Education: N/A   Occupational History  .     Social History Main Topics  . Smoking status: Former Smoker    Quit date: 10/23/1982  . Smokeless tobacco: Not on file  . Alcohol Use: No  . Drug Use: No  . Sexually Active:    Other Topics Concern  . Not on file   Social History Narrative  . No narrative on file    Current Outpatient Prescriptions on File Prior to Visit  Medication Sig Dispense Refill  . alfuzosin (UROXATRAL) 10 MG 24 hr tablet Take 10 mg by mouth daily.        Marland Kitchen allopurinol (ZYLOPRIM) 300 MG tablet TAKE 1 TABLET DAILY  90 tablet  2  . aspirin (BAYER LOW STRENGTH) 81 MG EC tablet Take 81 mg by mouth daily.        . BD ULTRA-FINE PEN NEEDLES 29G X 12.7MM MISC USE THREE TIMES A DAY  100 each  3  . celecoxib (CELEBREX) 200 MG capsule Take 1 tablet by  mouth once daily       . CRESTOR 40 MG tablet TAKE 1 TABLET AT BEDTIME  90 tablet  2  . cycloSPORINE (RESTASIS) 0.05 % ophthalmic emulsion Place 1 drop into both eyes 2 (two) times daily.        . furosemide (LASIX) 40 MG tablet TAKE 1 TABLET DAILY  90 tablet  2  . glucose blood (ONE TOUCH TEST STRIPS) test strip Once daily dx 250.01       . HUMALOG KWIKPEN 100 UNIT/ML injection INJECT 45 UNITS, 30 UNITS, 50 UNITS BEFORE MEALS THREE TIMES A DAY AS DIRECTED  3 mL  3  . Lancets (ONETOUCH ULTRASOFT) lancets Use as directed dx 250.01       . Multiple Vitamins-Minerals (CENTRUM SILVER PO) Take 1 tablet by mouth daily.        . Omega-3 Fatty Acids (FISH OIL) 1200 MG CAPS Take 1 capsule by mouth daily.        Marland Kitchen omeprazole (PRILOSEC) 20 MG capsule Take 20 mg by mouth daily. As needed       . clobetasol (TEMOVATE) 0.05 % cream Apply topically 2 (  two) times daily. As needed         Allergies  Allergen Reactions  . Atorvastatin   . Niacin     REACTION: Severe heartburn  . Pioglitazone     REACTION: Edema    Family History  Problem Relation Age of Onset  . Cancer Brother     colon cancer  . Cancer Brother     lung cancer    BP 122/62  Pulse 95  Temp(Src) 98.2 F (36.8 C) (Oral)  Ht 6' (1.829 m)  Wt 275 lb 4.8 oz (124.875 kg)  BMI 37.34 kg/m2  SpO2 96%  Review of Systems He report weight gain.  He has chronic numbness of the feet.     Objective:   Physical Exam Pulses: dorsalis pedis intact bilat.   Feet: no deformity.  no ulcer on the feet.  feet are of normal color and temp.  no edema.  There is a 1 cm compressible, nontender prominence, on the plantar aspect of the right foot. Neuro: sensation is intact to touch on the feet, but decreased from normal    Lab Results  Component Value Date   HGBA1C 8.4* 04/19/2011   Assessment & Plan:  Dm, needs increased rx Weight gain, surgical referral has been declined Foot prominence, uncertain etiology.  No rx is needed.

## 2011-04-19 NOTE — Patient Instructions (Addendum)
blood tests are being ordered for you today.  please call [phone] to hear your test results.  You will be prompted to enter the 9-digit "MRN" number that appears at the top left of this page, followed by #.  Then you will hear the message. pending the test results, please continue humalog 3x a day (just before each meal), 45-30-50 units.  increase nph to 45 units at bedtime. Please return in 3 months. (update: i left message on phone-tree:  Increase nph to 50 units qhs).

## 2011-05-06 ENCOUNTER — Other Ambulatory Visit: Payer: Self-pay | Admitting: Endocrinology

## 2011-06-02 ENCOUNTER — Other Ambulatory Visit: Payer: Self-pay | Admitting: Endocrinology

## 2011-06-19 ENCOUNTER — Other Ambulatory Visit: Payer: Self-pay | Admitting: Pulmonary Disease

## 2011-06-19 ENCOUNTER — Telehealth: Payer: Self-pay | Admitting: Pulmonary Disease

## 2011-06-19 DIAGNOSIS — G4733 Obstructive sleep apnea (adult) (pediatric): Secondary | ICD-10-CM

## 2011-06-19 NOTE — Telephone Encounter (Signed)
oi

## 2011-06-19 NOTE — Telephone Encounter (Signed)
Let him know order sent to [facility] to refer to ENT for evaluation.

## 2011-06-19 NOTE — Telephone Encounter (Signed)
Called and spoke with pt.  Pt last saw [facility] March 2011.  Was told to think about options of dental appliance vs surgery.  Pt states he would like to go ahead with surgery and is wanting KC's recs/referral.  KC, please advise.  Thanks.

## 2011-06-19 NOTE — Telephone Encounter (Signed)
Error.  Duplicate.  [name] ° °

## 2011-06-20 NOTE — Telephone Encounter (Signed)
Pt aware. Jennifer Castillo, CMA  

## 2011-07-18 ENCOUNTER — Encounter: Payer: Self-pay | Admitting: Endocrinology

## 2011-07-18 ENCOUNTER — Ambulatory Visit (INDEPENDENT_AMBULATORY_CARE_PROVIDER_SITE_OTHER): Payer: BC Managed Care – PPO | Admitting: Endocrinology

## 2011-07-18 ENCOUNTER — Other Ambulatory Visit (INDEPENDENT_AMBULATORY_CARE_PROVIDER_SITE_OTHER): Payer: BC Managed Care – PPO

## 2011-07-18 VITALS — BP 122/68 | HR 81 | Temp 98.0°F | Ht 72.0 in | Wt 279.0 lb

## 2011-07-18 DIAGNOSIS — E109 Type 1 diabetes mellitus without complications: Secondary | ICD-10-CM

## 2011-07-18 LAB — HEMOGLOBIN A1C: Hgb A1c MFr Bld: 7 % — ABNORMAL HIGH (ref 4.6–6.5)

## 2011-07-18 NOTE — Patient Instructions (Addendum)
blood tests are being ordered for you today.  please call [phone] to hear your test results.  You will be prompted to enter the 9-digit "MRN" number that appears at the top left of this page, followed by #.  Then you will hear the message. pending the test results, please continue humalog 3x a day (just before each meal), 35-30-45 units.  increase nph to 45 units at bedtime. Please make a "[place] [facility]" appointment in 4 months. continue nph 50 units at bedtime.

## 2011-07-18 NOTE — Progress Notes (Signed)
Subjective:    Patient ID: [name], male    DOB: 02-27-1941, 70 y.o.   MRN: [id number]  HPI he brings a record of his cbg's which i have reviewed today.  Due to hypoglycemia at lunch and in the afternoon, he reduced breakfast humalog to 35, and lunch to 30.  On this, cbg's are well-controlled.  There is no trend throughout the day. Past Medical History  Diagnosis Date  . Hepatic steatosis   . Urolithiasis   . Hypogonadism male   . Gynecomastia   . Anal fissure   . DM type 1 with diabetic peripheral neuropathy   . Personal history of colonic polyps   . Anemia, unspecified   . Duodenitis   . Esophageal stricture   . Arthritis     Spinal OA  . DIABETES MELLITUS, TYPE I 05/20/2007  . HYPERLIPIDEMIA 05/20/2007  . 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 Morbid obesity 09/10/2009  . OBSTRUCTIVE SLEEP APNEA 11/04/2007  . DIASTOLIC DYSFUNCTION 99991111  . RENAL INSUFFICIENCY 05/12/2008  . BACK PAIN, LUMBAR 10/23/2007  . CARDIAC MURMUR, SYSTOLIC [id number]  . Edema 05/12/2008    No past surgical history on file.  History   Social History  . Marital Status: Married    Spouse Name: N/A    Number of Children: N/A  . Years of Education: N/A   Occupational History  .     Social History Main Topics  . Smoking status: Former Smoker    Quit date: 10/23/1982  . Smokeless tobacco: Not on file  . Alcohol Use: No  . Drug Use: No  . Sexually Active:    Other Topics Concern  . Not on file   Social History Narrative  . No narrative on file    Current Outpatient Prescriptions on File Prior to Visit  Medication Sig Dispense Refill  . alfuzosin (UROXATRAL) 10 MG 24 hr tablet TAKE 1 TABLET DAILY  90 tablet  1  . allopurinol (ZYLOPRIM) 300 MG tablet TAKE 1 TABLET DAILY  90 tablet  2  . aspirin (BAYER LOW STRENGTH) 81 MG EC tablet Take 81 mg by mouth daily.        . BD ULTRA-FINE PEN NEEDLES 29G X 12.7MM MISC USE THREE TIMES A DAY  100 each  3  . CELEBREX 200 MG capsule TAKE 1 CAPSULE DAILY  90 capsule  1    . clobetasol (TEMOVATE) 0.05 % cream Apply topically 2 (two) times daily. As needed       . CRESTOR 40 MG tablet TAKE 1 TABLET AT BEDTIME  90 tablet  2  . cycloSPORINE (RESTASIS) 0.05 % ophthalmic emulsion Place 1 drop into both eyes 2 (two) times daily.        . furosemide (LASIX) 40 MG tablet TAKE 1 TABLET DAILY  90 tablet  2  . insulin NPH (HUMULIN N,NOVOLIN N) 100 UNIT/ML injection Inject 45 Units into the skin at bedtime.        . Insulin Pen Needle (PEN NEEDLES 31GX5/16") 31G X 8 MM MISC Inject 1 Device into the skin 4 (four) times daily. Three times a day  400 each  3  . Lancets (ONETOUCH ULTRASOFT) lancets Use as directed dx 250.01       . Multiple Vitamins-Minerals (CENTRUM SILVER PO) Take 1 tablet by mouth daily.        . Omega-3 Fatty Acids (FISH OIL) 1200 MG CAPS Take 1 capsule by mouth daily.        Marland Kitchen omeprazole (PRILOSEC)  20 MG capsule Take 20 mg by mouth daily. As needed       . ONE TOUCH ULTRA TEST test strip USE TWICE A DAY  2 each  2  . DISCONTD: HUMALOG KWIKPEN 100 UNIT/ML injection INJECT 45 UNITS, 30 UNITS, 50 UNITS BEFORE MEALS THREE TIMES A DAY AS DIRECTED  3 mL  3    Allergies  Allergen Reactions  . Atorvastatin   . Niacin     REACTION: Severe heartburn  . Pioglitazone     REACTION: Edema    Family History  Problem Relation Age of Onset  . Cancer Brother     colon cancer  . Cancer Brother     lung cancer   BP 122/68  Pulse 81  Temp(Src) 98 F (36.7 C) (Oral)  Ht 6' (1.829 m)  Wt 279 lb (126.554 kg)  BMI 37.84 kg/m2  SpO2 95%  Review of Systems Denies loc    Objective:   Physical Exam VITAL SIGNS:  See vs page GENERAL: no distress SKIN: Insulin injection sites at the anterior abdomen are normal, except for a few ecchymoses.     Assessment & Plan:  Dm is well-controlled with the reduction of his insulin.

## 2011-08-03 LAB — URINALYSIS, ROUTINE W REFLEX MICROSCOPIC
Bilirubin Urine: NEGATIVE
Glucose, UA: >1000 — AB
Nitrite: NEGATIVE
Specific Gravity, Urine: 1.027
pH: 6

## 2011-08-03 LAB — URINE MICROSCOPIC-ADD ON

## 2011-08-10 LAB — CLOTEST (H. PYLORI), BIOPSY: Helicobacter screen: NEGATIVE

## 2011-10-11 ENCOUNTER — Other Ambulatory Visit: Payer: Self-pay | Admitting: Endocrinology

## 2011-10-12 ENCOUNTER — Other Ambulatory Visit: Payer: Self-pay | Admitting: Endocrinology

## 2011-10-13 ENCOUNTER — Other Ambulatory Visit: Payer: Self-pay | Admitting: Endocrinology

## 2011-11-15 ENCOUNTER — Encounter: Payer: Self-pay | Admitting: Endocrinology

## 2011-11-15 ENCOUNTER — Ambulatory Visit (INDEPENDENT_AMBULATORY_CARE_PROVIDER_SITE_OTHER): Payer: 59 | Admitting: Endocrinology

## 2011-11-15 ENCOUNTER — Ambulatory Visit (INDEPENDENT_AMBULATORY_CARE_PROVIDER_SITE_OTHER)
Admission: RE | Admit: 2011-11-15 | Discharge: 2011-11-15 | Disposition: A | Discharge status: Home / Self Care | Payer: 59 | Source: Ambulatory Visit | Attending: Endocrinology | Admitting: Endocrinology

## 2011-11-15 ENCOUNTER — Other Ambulatory Visit (INDEPENDENT_AMBULATORY_CARE_PROVIDER_SITE_OTHER): Payer: 59

## 2011-11-15 VITALS — BP 112/70 | HR 91 | Temp 97.5°F | Ht 72.0 in | Wt 286.3 lb

## 2011-11-15 DIAGNOSIS — M25569 Pain in unspecified knee: Secondary | ICD-10-CM

## 2011-11-15 DIAGNOSIS — Z136 Encounter for screening for cardiovascular disorders: Secondary | ICD-10-CM

## 2011-11-15 DIAGNOSIS — E109 Type 1 diabetes mellitus without complications: Secondary | ICD-10-CM

## 2011-11-15 DIAGNOSIS — M25561 Pain in right knee: Secondary | ICD-10-CM | POA: Insufficient documentation

## 2011-11-15 LAB — HEMOGLOBIN A1C: Hgb A1c MFr Bld: 8.4 % — ABNORMAL HIGH (ref 4.6–6.5)

## 2011-11-15 MED ORDER — TRAMADOL HCL 50 MG PO TABS: 50.0000 mg | ORAL_TABLET | ORAL | Status: AC | PRN

## 2011-11-15 NOTE — Progress Notes (Signed)
Subjective:    Patient ID: [name], male    DOB: [birth year], 71 y.o.   MRN: [id number]  HPI Pt returns for f/u of insulin-requiring DM (1986).  no cbg record, but states cbg's are well-controlled.  It is lowest in the afternoon, and highest then also.   Pt states 5 years of pain at the right knee and left hip, and slight assoc numbness of the legs.   Past Medical History  Diagnosis Date  . Hepatic steatosis   . Urolithiasis   . Hypogonadism male   . Gynecomastia   . Anal fissure   . DM type 1 with diabetic peripheral neuropathy   . Personal history of colonic polyps   . Anemia, unspecified   . Duodenitis   . Esophageal stricture   . Arthritis     Spinal OA  . DIABETES MELLITUS, TYPE I 05/20/2007  . HYPERLIPIDEMIA 05/20/2007  . 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 Morbid obesity 09/10/2009  . OBSTRUCTIVE SLEEP APNEA 11/04/2007  . DIASTOLIC DYSFUNCTION 99991111  . RENAL INSUFFICIENCY 05/12/2008  . BACK PAIN, LUMBAR 10/23/2007  . CARDIAC MURMUR, SYSTOLIC [id number]  . Edema 05/12/2008    No past surgical history on file.  History   Social History  . Marital Status: Married    Spouse Name: N/A    Number of Children: N/A  . Years of Education: N/A   Occupational History  .     Social History Main Topics  . Smoking status: Former Smoker    Quit date: 10/23/1982  . Smokeless tobacco: Not on file  . Alcohol Use: No  . Drug Use: No  . Sexually Active:    Other Topics Concern  . Not on file   Social History Narrative  . No narrative on file    Current Outpatient Prescriptions on File Prior to Visit  Medication Sig Dispense Refill  . alfuzosin (UROXATRAL) 10 MG 24 hr tablet TAKE 1 TABLET DAILY  90 tablet  1  . allopurinol (ZYLOPRIM) 300 MG tablet TAKE 1 TABLET DAILY  90 tablet  1  . aspirin (BAYER LOW STRENGTH) 81 MG EC tablet Take 81 mg by mouth daily.        . BD ULTRA-FINE PEN NEEDLES 29G X 12.7MM MISC USE THREE TIMES A DAY  100 each  3  . CELEBREX 200 MG capsule TAKE 1 CAPSULE  DAILY  90 capsule  1  . clobetasol (TEMOVATE) 0.05 % cream Apply topically 2 (two) times daily. As needed       . CRESTOR 40 MG tablet TAKE 1 TABLET AT BEDTIME  90 tablet  1  . cycloSPORINE (RESTASIS) 0.05 % ophthalmic emulsion Place 1 drop into both eyes 2 (two) times daily.        . furosemide (LASIX) 40 MG tablet TAKE 1 TABLET DAILY  90 tablet  2  . insulin lispro (HUMALOG KWIKPEN) 100 UNIT/ML injection        . insulin NPH (HUMULIN N,NOVOLIN N) 100 UNIT/ML injection Inject 45 Units into the skin at bedtime.        . Lancets (ONETOUCH ULTRASOFT) lancets Use as directed dx 250.01       . Multiple Vitamins-Minerals (CENTRUM SILVER PO) Take 1 tablet by mouth daily.        . Omega-3 Fatty Acids (FISH OIL) 1200 MG CAPS Take 1 capsule by mouth daily.        Marland Kitchen omeprazole (PRILOSEC) 20 MG capsule Take 20 mg by mouth daily. As needed       .  ONE TOUCH ULTRA TEST test strip USE TWICE A DAY  2 each  2    Allergies  Allergen Reactions  . Atorvastatin   . Niacin     REACTION: Severe heartburn  . Pioglitazone     REACTION: Edema    Family History  Problem Relation Age of Onset  . Cancer Brother     colon cancer  . Cancer Brother     lung cancer   BP 112/70  Pulse 91  Temp(Src) 97.5 F (36.4 C) (Oral)  Ht 6' (1.829 m)  Wt 286 lb 5 oz (129.87 kg)  BMI 38.83 kg/m2  SpO2 95%  Review of Systems denies hypoglycemia.  He reports weight gain.      Objective:   Physical Exam VITAL SIGNS:  See vs page GENERAL: no distress Pulses: dorsalis pedis intact bilat.   Feet: no deformity.  no ulcer on the feet.  feet are of normal color and temp.  no edema. Neuro: sensation is intact to touch on the feet Right knee is normal to my exam.    Lab Results  Component Value Date   HGBA1C 8.4* 11/15/2011      Assessment & Plan:  Arthralgias, uncertain etiology, new DM, needs increased rx.

## 2011-11-15 NOTE — Patient Instructions (Addendum)
blood tests and an x-ray are being ordered for you today.  please call [phone] to hear your test results.  You will be prompted to enter the 9-digit "MRN" number that appears at the top left of this page, followed by #.  Then you will hear the message. pending the test results, please continue humalog 3x a day (just before each meal), 35-30-45 units.  Also continue nph, 45 units at bedtime. Please come back for a regular physical appointment in 3 months.   i have sent a prescription to your pharmacy, for a pain pill, that you could take in addition to the celebrex, as needed. (update: i left message on phone-tree:  Increase humalog to tid (qac) 40-35-50 units)

## 2011-11-17 ENCOUNTER — Telehealth: Payer: Self-pay | Admitting: *Deleted

## 2011-11-17 NOTE — Telephone Encounter (Signed)
Pt states he was in office on Wed 01.23.13& requested RX refills to [facility] only Rx sent]; requesting all meds, including diabetic supplies to [facility].

## 2011-11-20 MED ORDER — ALFUZOSIN HCL ER 10 MG PO TB24: ORAL_TABLET | ORAL | Status: DC

## 2011-11-20 MED ORDER — GLUCOSE BLOOD VI STRP: ORAL_STRIP | Status: DC

## 2011-11-20 MED ORDER — OMEPRAZOLE 20 MG PO CPDR: 20.0000 mg | DELAYED_RELEASE_CAPSULE | Freq: Every day | ORAL | Status: DC

## 2011-11-20 MED ORDER — CELECOXIB 200 MG PO CAPS: ORAL_CAPSULE | ORAL | Status: DC

## 2011-11-20 MED ORDER — INSULIN LISPRO 100 UNIT/ML ~~LOC~~ SOLN: SUBCUTANEOUS | Status: DC

## 2011-11-20 MED ORDER — ALLOPURINOL 300 MG PO TABS: ORAL_TABLET | ORAL | Status: DC

## 2011-11-20 MED ORDER — ROSUVASTATIN CALCIUM 40 MG PO TABS: ORAL_TABLET | ORAL | Status: DC

## 2011-11-20 MED ORDER — INSULIN NPH (HUMAN) (ISOPHANE) 100 UNIT/ML ~~LOC~~ SUSP: 45.0000 [IU] | Freq: Every day | SUBCUTANEOUS | Status: DC

## 2011-11-20 MED ORDER — ONETOUCH ULTRASOFT LANCETS MISC: Status: DC

## 2011-11-20 MED ORDER — INSULIN PEN NEEDLE 29G X 12.7MM MISC: Status: DC

## 2011-11-20 MED ORDER — FUROSEMIDE 40 MG PO TABS: ORAL_TABLET | ORAL | Status: DC

## 2011-11-20 NOTE — Telephone Encounter (Signed)
Rx sent to [facility], pt informed.

## 2012-02-14 ENCOUNTER — Encounter: Payer: Self-pay | Admitting: Endocrinology

## 2012-02-14 ENCOUNTER — Other Ambulatory Visit (INDEPENDENT_AMBULATORY_CARE_PROVIDER_SITE_OTHER): Payer: 59

## 2012-02-14 ENCOUNTER — Ambulatory Visit (INDEPENDENT_AMBULATORY_CARE_PROVIDER_SITE_OTHER): Payer: 59 | Admitting: Endocrinology

## 2012-02-14 VITALS — BP 112/64 | HR 82 | Temp 97.4°F | Ht 72.0 in | Wt 281.0 lb

## 2012-02-14 DIAGNOSIS — E785 Hyperlipidemia, unspecified: Secondary | ICD-10-CM

## 2012-02-14 DIAGNOSIS — Z79899 Other long term (current) drug therapy: Secondary | ICD-10-CM

## 2012-02-14 DIAGNOSIS — E109 Type 1 diabetes mellitus without complications: Secondary | ICD-10-CM

## 2012-02-14 DIAGNOSIS — M109 Gout, unspecified: Secondary | ICD-10-CM

## 2012-02-14 DIAGNOSIS — L259 Unspecified contact dermatitis, unspecified cause: Secondary | ICD-10-CM

## 2012-02-14 DIAGNOSIS — Z125 Encounter for screening for malignant neoplasm of prostate: Secondary | ICD-10-CM

## 2012-02-14 DIAGNOSIS — D61818 Other pancytopenia: Secondary | ICD-10-CM

## 2012-02-14 DIAGNOSIS — I10 Essential (primary) hypertension: Secondary | ICD-10-CM

## 2012-02-14 DIAGNOSIS — N259 Disorder resulting from impaired renal tubular function, unspecified: Secondary | ICD-10-CM

## 2012-02-14 LAB — URINALYSIS, ROUTINE W REFLEX MICROSCOPIC
Ketones, ur: NEGATIVE
Specific Gravity, Urine: 1.025 (ref 1.000–1.030)
Urine Glucose: NEGATIVE
pH: 5.5 (ref 5.0–8.0)

## 2012-02-14 LAB — HEPATIC FUNCTION PANEL
ALT: 28 U/L (ref 0–53)
AST: 24 U/L (ref 0–37)
Albumin: 3.9 g/dL (ref 3.5–5.2)
Total Bilirubin: 0.5 mg/dL (ref 0.3–1.2)
Total Protein: 6.6 g/dL (ref 6.0–8.3)

## 2012-02-14 LAB — CBC WITH DIFFERENTIAL/PLATELET
Basophils Absolute: 0 10*3/uL (ref 0.0–0.1)
Eosinophils Relative: 0.9 % (ref 0.0–5.0)
Lymphocytes Relative: 39.2 % (ref 12.0–46.0)
Monocytes Relative: 7.3 % (ref 3.0–12.0)
Neutrophils Relative %: 52.4 % (ref 43.0–77.0)
Platelets: 115 10*3/uL — ABNORMAL LOW (ref 150.0–400.0)
RDW: 14.7 % — ABNORMAL HIGH (ref 11.5–14.6)
WBC: 4.9 10*3/uL (ref 4.5–10.5)

## 2012-02-14 LAB — LIPID PANEL
Cholesterol: 105 mg/dL (ref 0–200)
LDL Cholesterol: 44 mg/dL (ref 0–99)
Triglycerides: 145 mg/dL (ref 0.0–149.0)

## 2012-02-14 LAB — URIC ACID: Uric Acid, Serum: 5.4 mg/dL (ref 4.0–7.8)

## 2012-02-14 LAB — BASIC METABOLIC PANEL
CO2: 30 mEq/L (ref 19–32)
Calcium: 9 mg/dL (ref 8.4–10.5)
Chloride: 103 mEq/L (ref 96–112)
Creatinine, Ser: 1.3 mg/dL (ref 0.4–1.5)
Sodium: 142 mEq/L (ref 135–145)

## 2012-02-14 LAB — PSA: PSA: 1.64 ng/mL (ref 0.10–4.00)

## 2012-02-14 LAB — IBC PANEL
Iron: 107 ug/dL (ref 42–165)
Transferrin: 254.9 mg/dL (ref 212.0–360.0)

## 2012-02-14 LAB — MICROALBUMIN / CREATININE URINE RATIO
Creatinine,U: 204.8 mg/dL
Microalb Creat Ratio: 0.7 mg/g (ref 0.0–30.0)

## 2012-02-14 LAB — HEMOGLOBIN A1C: Hgb A1c MFr Bld: 7.6 % — ABNORMAL HIGH (ref 4.6–6.5)

## 2012-02-14 MED ORDER — FUROSEMIDE 20 MG PO TABS: 20.0000 mg | ORAL_TABLET | Freq: Every day | ORAL | Status: DC

## 2012-02-14 NOTE — Progress Notes (Signed)
Subjective:    Patient ID: [name], male    DOB: Aug 20, 1941, 71 y.o.   MRN: [id number]  HPI here for regular wellness examination.  He's feeling pretty well in general, and says chronic med probs are stable, except as noted below Past Medical History  Diagnosis Date  . Hepatic steatosis   . Urolithiasis   . Hypogonadism male   . Gynecomastia   . Anal fissure   . DM type 1 with diabetic peripheral neuropathy   . Personal history of colonic polyps   . Anemia, unspecified   . Duodenitis   . Esophageal stricture   . Arthritis     Spinal OA  . DIABETES MELLITUS, TYPE I 05/20/2007  . HYPERLIPIDEMIA 05/20/2007  . 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 Morbid obesity 09/10/2009  . OBSTRUCTIVE SLEEP APNEA 11/04/2007  . DIASTOLIC DYSFUNCTION 99991111  . RENAL INSUFFICIENCY 05/12/2008  . BACK PAIN, LUMBAR 10/23/2007  . CARDIAC MURMUR, SYSTOLIC [id number]  . Edema 05/12/2008    No past surgical history on file.  History   Social History  . Marital Status: Married    Spouse Name: N/A    Number of Children: N/A  . Years of Education: N/A   Occupational History  .     Social History Main Topics  . Smoking status: Former Smoker    Quit date: 10/23/1982  . Smokeless tobacco: Not on file  . Alcohol Use: No  . Drug Use: No  . Sexually Active:    Other Topics Concern  . Not on file   Social History Narrative  . No narrative on file    Current Outpatient Prescriptions on File Prior to Visit  Medication Sig Dispense Refill  . alfuzosin (UROXATRAL) 10 MG 24 hr tablet TAKE 1 TABLET DAILY  90 tablet  3  . allopurinol (ZYLOPRIM) 300 MG tablet TAKE 1 TABLET DAILY  90 tablet  3  . aspirin (BAYER LOW STRENGTH) 81 MG EC tablet Take 81 mg by mouth daily.        . celecoxib (CELEBREX) 200 MG capsule TAKE 1 CAPSULE DAILY  90 capsule  3  . clobetasol (TEMOVATE) 0.05 % cream Apply topically 2 (two) times daily. As needed       . cycloSPORINE (RESTASIS) 0.05 % ophthalmic emulsion Place 1 drop into both eyes 2  (two) times daily.        Marland Kitchen glucose blood (ONE TOUCH ULTRA TEST) test strip Use as instructedUSE TWICE A DAY  200 each  3  . Insulin Pen Needle (BD ULTRA-FINE PEN NEEDLES) 29G X 12.7MM MISC USE THREE TIMES A DAY  200 each  3  . Lancets (ONETOUCH ULTRASOFT) lancets Use as directed dx 250.01  200 each  3  . Multiple Vitamins-Minerals (CENTRUM SILVER PO) Take 1 tablet by mouth daily.        . Omega-3 Fatty Acids (FISH OIL) 1200 MG CAPS Take 1 capsule by mouth daily.        Marland Kitchen omeprazole (PRILOSEC) 20 MG capsule Take 1 capsule (20 mg total) by mouth daily. As needed  90 capsule  3  . rosuvastatin (CRESTOR) 40 MG tablet TAKE 1 TABLET AT BEDTIME  90 tablet  3  . DISCONTD: insulin lispro (HUMALOG KWIKPEN) 100 UNIT/ML injection Inject subcutaneously three times a day (just before each meal) 40-35-50 units  135 mL  3  . DISCONTD: insulin NPH (HUMULIN N,NOVOLIN N) 100 UNIT/ML injection Inject 45 Units into the skin at bedtime.  60 mL  3  Allergies  Allergen Reactions  . Atorvastatin   . Niacin     REACTION: Severe heartburn  . Pioglitazone     REACTION: Edema    Family History  Problem Relation Age of Onset  . Cancer Brother     colon cancer  . Cancer Brother     lung cancer    BP 112/64  Pulse 82  Temp(Src) 97.4 F (36.3 C) (Oral)  Ht 6' (1.829 m)  Wt 281 lb (127.461 kg)  BMI 38.11 kg/m2  SpO2 95%  Review of Systems  Constitutional: Negative for fever.  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  Respiratory: Negative for shortness of breath.   Cardiovascular: Negative for chest pain.  Gastrointestinal: Negative for anal bleeding.  Genitourinary: Negative for difficulty urinating.  Musculoskeletal: Positive for back pain.  Skin: Negative for rash.  Neurological: Negative for headaches.  Hematological: Bruises/bleeds easily.  Psychiatric/Behavioral: Negative for dysphoric mood.      Objective:   Physical Exam VS: see vs  page GEN: no distress.  obese HEAD: head: no deformity eyes: no periorbital swelling, no proptosis external nose and ears are normal mouth: no lesion seen NECK: supple, thyroid is not enlarged CHEST WALL: no deformity LUNGS: clear to auscultation BREASTS:  pseudogynecomastia CV: reg rate and rhythm, no murmur ABD: abdomen is soft, nontender.  no hepatosplenomegaly.  not distended.  no hernia RECTAL: normal external and internal exam.  heme neg. PROSTATE:  Normal size.  No nodule MUSCULOSKELETAL: muscle bulk and strength are grossly normal.  no obvious joint swelling.  gait is normal and steady PULSES: no carotid bruit NEURO:  cn 2-12 grossly intact.   readily moves all 4's.  sensation is intact to touch on the feet, but decreased from normal SKIN:  Normal texture and temperature.  No rash or suspicious lesion is visible.   NODES:  None palpable at the neck.   PSYCH: alert, oriented x3.  Does not appear anxious nor depressed.  i reviewed electrocardiogram    Assessment & Plan:  Wellness visit today, with problems stable, except as noted.       SEPARATE EVALUATION FOLLOWS--EACH PROBLEM HERE IS NEW, NOT RESPONDING TO TREATMENT, OR POSES SIGNIFICANT RISK TO THE PATIENT'S HEALTH: HISTORY OF THE PRESENT ILLNESS: he brings a record of his cbg's which i have reviewed today.  It varies from 63-165.  There is no trend throughout the day.He is on a renewed dietary effort recently.   He has a few weeks of slight dizziness in the context of standing upright.  No assoc loc. PAST MEDICAL HISTORY reviewed and up to date today REVIEW OF SYSTEMS: He has lost weight, due to his efforts.  Denies hematuria PHYSICAL EXAMINATION: VITAL SIGNS:  See vs page GENERAL: no distress Pulses: dorsalis pedis intact bilat.   Feet: no deformity.  no ulcer on the feet.  feet are of normal color and temp.  1+ bilat leg edema LAB/XRAY RESULTS: Lab Results  Component Value Date   HGBA1C 7.6* 02/14/2012   Lab  Results  Component Value Date   WBC 4.9 02/14/2012   HGB 15.8 02/14/2012   HCT 46.5 02/14/2012   MCV 92.0 02/14/2012   PLT 115.0 Repeated and verified X2.* 02/14/2012  IMPRESSION: DM, improved with weight loss. Dizziness, new.  prob due to reduced need for lasix with weight loss. PLAN: See instruction page

## 2012-02-14 NOTE — Patient Instructions (Addendum)
Decrease humalog to three times a day (just before each meal) 30-25-30 units. Decrease nph to 35 units at bedtime. please consider these measures for your health:  minimize alcohol.  do not use tobacco products.  have a colonoscopy at least every 10 years from age 71.  keep firearms safely stored.  always use seat belts.  have working smoke alarms in your home.  see an eye doctor and dentist regularly.  never drive under the influence of alcohol or drugs (including prescription drugs).  those with fair skin should take precautions against the sun. Reduce furosemide to 20 mg daily.  please let me know what your wishes would be, if artificial life support measures should become necessary.  it is critically important to prevent falling down (keep floor areas well-lit, dry, and free of loose objects.  If you have a cane, walker, or wheelchair, you should use it, even for short trips around the house.  Also, try not to rush) Please come back for a follow-up appointment in 3 months. You should have a vaccine against shingles (a painful rash which results from the  chickenpox infection which most people had many years ago).  This vaccine reduces, but does not totally eliminate the risk of shingles.  Because this is a medicare part d benefit, you should get it at a pharmacy.   (update: we discussed code status.  pt requests full code, but would not want to be started or maintained on artificial life-support measures if there was not a reasonable chance of recovery)

## 2012-02-15 ENCOUNTER — Telehealth: Payer: Self-pay | Admitting: *Deleted

## 2012-02-15 NOTE — Telephone Encounter (Signed)
Called pt to inform of lab results, left message for pt to callback office (letter also mailed to pt). 

## 2012-02-15 NOTE — Telephone Encounter (Signed)
Pt's wife informed of lab results.

## 2012-04-08 ENCOUNTER — Encounter: Payer: Self-pay | Admitting: Pulmonary Disease

## 2012-04-09 ENCOUNTER — Ambulatory Visit (INDEPENDENT_AMBULATORY_CARE_PROVIDER_SITE_OTHER): Payer: Medicare Other | Admitting: Pulmonary Disease

## 2012-04-09 ENCOUNTER — Telehealth: Payer: Self-pay | Admitting: Pulmonary Disease

## 2012-04-09 ENCOUNTER — Encounter: Payer: Self-pay | Admitting: Pulmonary Disease

## 2012-04-09 VITALS — BP 130/86 | HR 73 | Temp 98.1°F | Ht 72.0 in | Wt 277.0 lb

## 2012-04-09 DIAGNOSIS — G4733 Obstructive sleep apnea (adult) (pediatric): Secondary | ICD-10-CM

## 2012-04-09 NOTE — Telephone Encounter (Signed)
Called spoke with Lauren with [facility] who is requesting to have a copy of pt's ov note from today be faxed to [phone] for billing for pt CPAP supplies.    Will forward to [facility]

## 2012-04-09 NOTE — Assessment & Plan Note (Signed)
The patient has a history of mild sleep apnea with significant symptoms.  He has not tolerated CPAP in the past because of gulping, and he would probably be a reasonable surgical candidate based on prior otolaryngology evaluation.  He would like to try CPAP one more time to see if he can tolerate with some of the newer masks.  I would like to decrease his pressure initially, and work him toward his optimal level.  I've also encouraged him to work aggressively on weight loss.

## 2012-04-09 NOTE — Telephone Encounter (Signed)
LMTCB- pt was not seen by [facility] today, last seen 06-19-11

## 2012-04-09 NOTE — Telephone Encounter (Signed)
No.  If she is not happy with that, will refer pt elsewhere.  She has no right to those office notes.

## 2012-04-09 NOTE — Progress Notes (Signed)
  Subjective:    Patient ID: [name], male    DOB: 11-Nov-1940, 71 y.o.   MRN: [id number]  HPI The patient comes in today for followup of his known mild obstructive sleep apnea.  He was intolerant of CPAP because of air gulping, and was evaluated by ENT and felt to be a reasonable candidate for surgery.  He had is she is getting his nasal septal reconstruction and cleared by insurance, and therefore has not had the procedure done.  He went back to trying CPAP the last few days, and thinks he may be able to tolerate better this time around.  He has a well-functioning CPAP machine, but just needs new supplies.   Review of Systems  Constitutional: Negative.  Negative for fever and unexpected weight change.  HENT: Positive for rhinorrhea. Negative for ear pain, nosebleeds, congestion, sore throat, sneezing, trouble swallowing, dental problem, postnasal drip and sinus pressure.   Eyes: Negative.  Negative for redness and itching.  Respiratory: Positive for shortness of breath. Negative for cough, chest tightness and wheezing.   Cardiovascular: Positive for leg swelling. Negative for palpitations.  Gastrointestinal: Negative.  Negative for nausea and vomiting.  Genitourinary: Negative.  Negative for dysuria.  Musculoskeletal: Negative.  Negative for joint swelling.  Skin: Negative.  Negative for rash.  Neurological: Negative.  Negative for headaches.  Hematological: Negative.  Does not bruise/bleed easily.  Psychiatric/Behavioral: Negative.  Negative for dysphoric mood. The patient is not nervous/anxious.        Objective:   Physical Exam Morbidly obese male in no acute distress Nose without purulence or discharge noted Lower extremities with minimal edema, no cyanosis Awake, but mildly sleepy, moves all 4 extremities.        Assessment & Plan:

## 2012-04-09 NOTE — Patient Instructions (Addendum)
Will get your cpap pressure set at 12 for now, and will get new supplies and mask for you. Work on weight loss Please call me in 3 weeks before you go on your trip so we can make adjustments if we need to. followup with me in office in 8 weeks to check on progress, but call if issues arise.

## 2012-04-09 NOTE — Telephone Encounter (Signed)
Returning call.

## 2012-04-10 NOTE — Telephone Encounter (Signed)
Spoke with Lauren @ [facility], to inquire what is needed.  She has informed me she is new to [facility] and they have everything they need for this patient. She was very sorry for the confusion. Verdie Mosher

## 2012-04-10 NOTE — Telephone Encounter (Signed)
[name] please find out what is needed and why

## 2012-05-01 ENCOUNTER — Telehealth: Payer: Self-pay | Admitting: Pulmonary Disease

## 2012-05-01 NOTE — Telephone Encounter (Signed)
Called spoke with patient who is wondering if his CPAP pressure needs to be increased.  Pt states he is still waking up several times during the night.  Leaving Saturday to go to [place] for 15 days.    [place] is out of the office for the next week.  Next ov w/ [place] 8.13.13.  Pt is okay with this and verbalized his understanding.  Pt will call back when he returns for recs.

## 2012-05-02 LAB — HM DIABETES EYE EXAM

## 2012-05-23 ENCOUNTER — Ambulatory Visit (INDEPENDENT_AMBULATORY_CARE_PROVIDER_SITE_OTHER): Payer: 59 | Admitting: Endocrinology

## 2012-05-23 ENCOUNTER — Other Ambulatory Visit (INDEPENDENT_AMBULATORY_CARE_PROVIDER_SITE_OTHER): Payer: 59

## 2012-05-23 ENCOUNTER — Encounter: Payer: Self-pay | Admitting: Endocrinology

## 2012-05-23 VITALS — BP 130/74 | HR 81 | Temp 97.8°F | Ht 72.0 in | Wt 278.0 lb

## 2012-05-23 DIAGNOSIS — E109 Type 1 diabetes mellitus without complications: Secondary | ICD-10-CM

## 2012-05-23 LAB — HEMOGLOBIN A1C: Hgb A1c MFr Bld: 7.4 % — ABNORMAL HIGH (ref 4.6–6.5)

## 2012-05-23 NOTE — Patient Instructions (Addendum)
blood tests are being requested for you today.  You will receive a letter with results. check your blood sugar twice a day.  vary the time of day when you check, between before the 3 meals, and at bedtime.  also check if you have symptoms of your blood sugar being too high or too low.  please keep a record of the readings and bring it to your next appointment here.  please call us sooner if your blood sugar goes below 70, or if it stays over 200. Please come back for a follow-up appointment in 3 months.

## 2012-05-23 NOTE — Progress Notes (Signed)
Subjective:    Patient ID: [name], male    DOB: 07-Mar-1941, 71 y.o.   MRN: [id number]  HPI Pt returns for f/u of insulin-requiring DM (dx'ed Q000111Q; complicated by renal insufficiency and peripheral sensory neuropathy).  pt states he feels well in general.  He seldom has hypoglycemia, and these episodes are mild.  It usually happens before lunch.  It is highest in the afternoon.   Past Medical History  Diagnosis Date  . Hepatic steatosis   . Urolithiasis   . Hypogonadism male   . Gynecomastia   . Anal fissure   . DM type 1 with diabetic peripheral neuropathy   . Personal history of colonic polyps   . Anemia, unspecified   . Duodenitis   . Esophageal stricture   . Arthritis     Spinal OA  . DIABETES MELLITUS, TYPE I 05/20/2007  . HYPERLIPIDEMIA 05/20/2007  . 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 Morbid obesity 09/10/2009  . OBSTRUCTIVE SLEEP APNEA 11/04/2007  . DIASTOLIC DYSFUNCTION 99991111  . RENAL INSUFFICIENCY 05/12/2008  . BACK PAIN, LUMBAR 10/23/2007  . CARDIAC MURMUR, SYSTOLIC [id number]  . Edema 05/12/2008    Past Surgical History  Procedure Date  . Abdominal US 09/1997  . Rest cardiolite 03/19/2003  . Lower arterial 04/13/2004  . Electrocardiogram 02/2006  . Colonoscopy 08/18/2004    diverticulitis  . Colonoscopy 09/24/2009  . Colon cancer screening   . Flexible sigmoidoscopy 03/04/2001    polyps, anal fissure    History   Social History  . Marital Status: Married    Spouse Name: N/A    Number of Children: N/A  . Years of Education: N/A   Occupational History  .     Social History Main Topics  . Smoking status: Former Smoker -- 2.0 packs/day for 31 years    Types: Cigarettes    Quit date: 10/23/1982  . Smokeless tobacco: Not on file  . Alcohol Use: No  . Drug Use: No  . Sexually Active: Not on file   Other Topics Concern  . Not on file   Social History Narrative  . No narrative on file    Current Outpatient Prescriptions on File Prior to Visit  Medication Sig  Dispense Refill  . alfuzosin (UROXATRAL) 10 MG 24 hr tablet TAKE 1 TABLET DAILY  90 tablet  3  . allopurinol (ZYLOPRIM) 300 MG tablet TAKE 1 TABLET DAILY  90 tablet  3  . aspirin (BAYER LOW STRENGTH) 81 MG EC tablet Take 81 mg by mouth daily.        . celecoxib (CELEBREX) 200 MG capsule TAKE 1 CAPSULE DAILY  90 capsule  3  . cycloSPORINE (RESTASIS) 0.05 % ophthalmic emulsion Place 1 drop into both eyes 2 (two) times daily.        . furosemide (LASIX) 20 MG tablet Take 1 tablet (20 mg total) by mouth daily.  90 tablet  3  . glucose blood (ONE TOUCH ULTRA TEST) test strip Use as instructedUSE TWICE A DAY  200 each  3  . insulin lispro (HUMALOG) 100 UNIT/ML injection Inject subcutaneously three times a day (just before each meal) 30-25-30 units      . insulin NPH (HUMULIN N,NOVOLIN N) 100 UNIT/ML injection Inject 35 Units into the skin at bedtime.      . Lancets (ONETOUCH ULTRASOFT) lancets Use as directed dx 250.01  200 each  3  . Multiple Vitamins-Minerals (CENTRUM SILVER PO) Take 1 tablet by mouth daily.        Marland Kitchen  Omega-3 Fatty Acids (FISH OIL) 1200 MG CAPS Take 1 capsule by mouth daily.        Marland Kitchen omeprazole (PRILOSEC) 20 MG capsule Take 1 capsule (20 mg total) by mouth daily. As needed  90 capsule  3  . rosuvastatin (CRESTOR) 40 MG tablet TAKE 1 TABLET AT BEDTIME  90 tablet  3    Allergies  Allergen Reactions  . Atorvastatin   . Niacin     REACTION: Severe heartburn  . Pioglitazone     REACTION: Edema    Family History  Problem Relation Age of Onset  . Cancer Brother     colon cancer  . Cancer Brother     lung cancer    BP 130/74  Pulse 81  Temp 97.8 F (36.6 C) (Oral)  Ht 6' (1.829 m)  Wt 278 lb (126.1 kg)  BMI 37.70 kg/m2  SpO2 96%    Review of Systems Denies LOC    Objective:   Physical Exam VITAL SIGNS:  See vs page GENERAL: no distress SKIN:  Insulin injection sites at the anterior abdomen are normal.         Assessment & Plan:  DM.  He might need an  increase of his lunch insulin

## 2012-05-27 ENCOUNTER — Telehealth: Payer: Self-pay | Admitting: *Deleted

## 2012-05-27 NOTE — Telephone Encounter (Signed)
Called pt to inform of lab results, pt informed (letter also mailed to pt). 

## 2012-06-04 ENCOUNTER — Ambulatory Visit: Payer: Medicare Other | Admitting: Pulmonary Disease

## 2012-06-20 ENCOUNTER — Ambulatory Visit (INDEPENDENT_AMBULATORY_CARE_PROVIDER_SITE_OTHER): Payer: Medicare Other | Admitting: Pulmonary Disease

## 2012-06-20 ENCOUNTER — Encounter: Payer: Self-pay | Admitting: Pulmonary Disease

## 2012-06-20 VITALS — BP 140/70 | HR 78 | Temp 97.8°F | Ht 72.0 in | Wt 280.2 lb

## 2012-06-20 DIAGNOSIS — G4733 Obstructive sleep apnea (adult) (pediatric): Secondary | ICD-10-CM

## 2012-06-20 NOTE — Progress Notes (Signed)
  Subjective:    Patient ID: [name], male    DOB: [birth year], 71 y.o.   MRN: [id number]  HPI Patient comes in today for followup of his obstructive sleep apnea.  He has had great difficulty tolerating CPAP in the past because of mask fit and air gulping.  He wanted to try CPAP one more time before considering upper airway surgery.  He has been doing.  Well since starting on CPAP, and has no issues with his mask fit or pressure.  He sleeps well at night, and although his daytime alertness is not completely normal, he feels that it is acceptable.   Review of Systems  Constitutional: Negative for fever and unexpected weight change.  HENT: Positive for congestion. Negative for ear pain, nosebleeds, sore throat, rhinorrhea, sneezing, trouble swallowing, dental problem, postnasal drip and sinus pressure.   Eyes: Positive for redness and itching.  Respiratory: Positive for shortness of breath and wheezing. Negative for cough and chest tightness.   Cardiovascular: Positive for leg swelling. Negative for palpitations.  Gastrointestinal: Negative for nausea and vomiting.  Genitourinary: Negative for dysuria.  Musculoskeletal: Negative for joint swelling.  Skin: Negative for rash.  Neurological: Negative for headaches.  Hematological: Bruises/bleeds easily.  Psychiatric/Behavioral: Negative for dysphoric mood. The patient is not nervous/anxious.        Objective:   Physical Exam Obese male in no acute distress No skin breakdown or pressure necrosis from the CPAP mask Lower extremities with minimal edema, no cyanosis Alert and oriented, does not appear to be sleepy, moves all 4 extremities.       Assessment & Plan:

## 2012-06-20 NOTE — Assessment & Plan Note (Signed)
The pt is doing well with cpap currently, and would like to continue on his current pressure level for now.  He feels his sleep is much improved, and although his daytime alertness is not completely normal, it is acceptable.  I have encouraged him to work aggressively on weight loss.

## 2012-06-20 NOTE — Patient Instructions (Addendum)
Continue on cpap at current pressure, but let us know if having issues.  Work on weight loss followup with me in one year.

## 2012-07-31 ENCOUNTER — Other Ambulatory Visit: Payer: Self-pay | Admitting: General Practice

## 2012-07-31 MED ORDER — CELECOXIB 200 MG PO CAPS: ORAL_CAPSULE | ORAL | Status: DC

## 2012-08-26 ENCOUNTER — Encounter: Payer: Self-pay | Admitting: Endocrinology

## 2012-08-26 ENCOUNTER — Ambulatory Visit (INDEPENDENT_AMBULATORY_CARE_PROVIDER_SITE_OTHER): Payer: Medicare Other | Admitting: Endocrinology

## 2012-08-26 VITALS — BP 134/80 | HR 78 | Temp 97.8°F | Wt 282.0 lb

## 2012-08-26 DIAGNOSIS — E109 Type 1 diabetes mellitus without complications: Secondary | ICD-10-CM

## 2012-08-26 DIAGNOSIS — E1029 Type 1 diabetes mellitus with other diabetic kidney complication: Secondary | ICD-10-CM

## 2012-08-26 NOTE — Progress Notes (Signed)
Subjective:    Patient ID: [name], male    DOB: 03/10/1941, 71 y.o.   MRN: [id number]  HPI Pt returns for f/u of insulin-requiring DM (dx'ed Q000111Q; complicated by renal insufficiency and peripheral sensory neuropathy).  pt states he feels well in general.  no cbg record, but states cbg's are sometimes low before lunch, or at hs.   Past Medical History  Diagnosis Date  . Hepatic steatosis   . Urolithiasis   . Hypogonadism male   . Gynecomastia   . Anal fissure   . DM type 1 with diabetic peripheral neuropathy   . Personal history of colonic polyps   . Anemia, unspecified   . Duodenitis   . Esophageal stricture   . Arthritis     Spinal OA  . DIABETES MELLITUS, TYPE I 05/20/2007  . HYPERLIPIDEMIA 05/20/2007  . 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 Morbid obesity 09/10/2009  . OBSTRUCTIVE SLEEP APNEA 11/04/2007  . DIASTOLIC DYSFUNCTION 99991111  . RENAL INSUFFICIENCY 05/12/2008  . BACK PAIN, LUMBAR 10/23/2007  . CARDIAC MURMUR, SYSTOLIC [id number]  . Edema 05/12/2008    Past Surgical History  Procedure Date  . Abdominal US 09/1997  . Rest cardiolite 03/19/2003  . Lower arterial 04/13/2004  . Electrocardiogram 02/2006  . Colonoscopy 08/18/2004    diverticulitis  . Colonoscopy 09/24/2009  . Colon cancer screening   . Flexible sigmoidoscopy 03/04/2001    polyps, anal fissure    History   Social History  . Marital Status: Married    Spouse Name: N/A    Number of Children: N/A  . Years of Education: N/A   Occupational History  .     Social History Main Topics  . Smoking status: Former Smoker -- 2.0 packs/day for 31 years    Types: Cigarettes    Quit date: 10/23/1982  . Smokeless tobacco: Never Used  . Alcohol Use: No  . Drug Use: No  . Sexually Active: Not on file   Other Topics Concern  . Not on file   Social History Narrative  . No narrative on file    Current Outpatient Prescriptions on File Prior to Visit  Medication Sig Dispense Refill  . alfuzosin (UROXATRAL) 10 MG 24 hr  tablet TAKE 1 TABLET DAILY  90 tablet  3  . allopurinol (ZYLOPRIM) 300 MG tablet TAKE 1 TABLET DAILY  90 tablet  3  . aspirin (BAYER LOW STRENGTH) 81 MG EC tablet Take 81 mg by mouth daily.        . celecoxib (CELEBREX) 200 MG capsule TAKE 1 CAPSULE DAILY  30 capsule  0  . cycloSPORINE (RESTASIS) 0.05 % ophthalmic emulsion Place 1 drop into both eyes 2 (two) times daily.        . furosemide (LASIX) 20 MG tablet Take 1 tablet (20 mg total) by mouth daily.  90 tablet  3  . glucose blood (ONE TOUCH ULTRA TEST) test strip Use as instructedUSE TWICE A DAY  200 each  3  . insulin lispro (HUMALOG) 100 UNIT/ML injection Inject subcutaneously three times a day (just before each meal) 35-25-30 units      . insulin NPH (HUMULIN N,NOVOLIN N) 100 UNIT/ML injection Inject 35 Units into the skin at bedtime.      . Insulin Pen Needle 29G X 12.7MM MISC USE four TIMES A DAY      . Lancets (ONETOUCH ULTRASOFT) lancets Use as directed dx 250.01  200 each  3  . Multiple Vitamins-Minerals (CENTRUM SILVER PO) Take 1  tablet by mouth daily.        . Omega-3 Fatty Acids (FISH OIL) 1200 MG CAPS Take 1 capsule by mouth daily.        Marland Kitchen omeprazole (PRILOSEC) 20 MG capsule Take 1 capsule (20 mg total) by mouth daily. As needed  90 capsule  3  . rosuvastatin (CRESTOR) 40 MG tablet TAKE 1 TABLET AT BEDTIME  90 tablet  3    Allergies  Allergen Reactions  . Atorvastatin   . Niacin     REACTION: Severe heartburn  . Pioglitazone     REACTION: Edema    Family History  Problem Relation Age of Onset  . Cancer Brother     colon cancer  . Cancer Brother     lung cancer    BP 134/80  Pulse 78  Temp 97.8 F (36.6 C) (Oral)  Wt 282 lb (127.914 kg)  SpO2 97%  Review of Systems Denies LOC    Objective:   Physical Exam Pulses: dorsalis pedis intact bilat.   Feet: no deformity.  no ulcer on the feet.  feet are of normal color and temp.  1+ bilat leg edema Neuro: sensation is intact to touch on the feet, but decreased  from normal.      Assessment & Plan:  DM, uncertain control

## 2012-08-26 NOTE — Patient Instructions (Addendum)
blood tests are being requested for you today.  You will be contacted with results. Please come back for a follow-up appointment in 3 months. check your blood sugar twice a day.  vary the time of day when you check, between before the 3 meals, and at bedtime.  also check if you have symptoms of your blood sugar being too high or too low.  please keep a record of the readings and bring it to your next appointment here.  please call us sooner if your blood sugar goes below 70, or if you have a lot of readings over 200. 

## 2012-08-27 NOTE — Progress Notes (Signed)
Pt advised and states an understanding 

## 2012-10-25 ENCOUNTER — Other Ambulatory Visit: Payer: Self-pay

## 2012-11-26 ENCOUNTER — Ambulatory Visit: Payer: Medicare Other | Admitting: Endocrinology

## 2012-11-27 ENCOUNTER — Encounter: Payer: Self-pay | Admitting: Endocrinology

## 2012-11-27 ENCOUNTER — Ambulatory Visit (INDEPENDENT_AMBULATORY_CARE_PROVIDER_SITE_OTHER): Payer: Medicare Other | Admitting: Endocrinology

## 2012-11-27 VITALS — BP 136/84 | HR 78 | Wt 288.0 lb

## 2012-11-27 DIAGNOSIS — E1029 Type 1 diabetes mellitus with other diabetic kidney complication: Secondary | ICD-10-CM

## 2012-11-27 NOTE — Patient Instructions (Addendum)
blood tests are being requested for you today.  You will be contacted with results. Please come back for a regular physical appointment in 3 months.   check your blood sugar twice a day.  vary the time of day when you check, between before the 3 meals, and at bedtime.  also check if you have symptoms of your blood sugar being too high or too low.  please keep a record of the readings and bring it to your next appointment here.  please call us sooner if your blood sugar goes below 70, or if you have a lot of readings over 200.

## 2012-11-27 NOTE — Progress Notes (Signed)
Subjective:    Patient ID: [name], male    DOB: Jun 11, 1941, 72 y.o.   MRN: [id number]  HPI Pt returns for f/u of insulin-requiring DM (dx'ed Q000111Q; complicated by renal insufficiency and peripheral sensory neuropathy).  pt states he feels well in general.  no cbg record, but states cbg's are highest in the afternoon.  It is lowest before lunch.   Past Medical History  Diagnosis Date  . Hepatic steatosis   . Urolithiasis   . Hypogonadism male   . Gynecomastia   . Anal fissure   . DM type 1 with diabetic peripheral neuropathy   . Personal history of colonic polyps   . Anemia, unspecified   . Duodenitis   . Esophageal stricture   . Arthritis     Spinal OA  . DIABETES MELLITUS, TYPE I 05/20/2007  . HYPERLIPIDEMIA 05/20/2007  . 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 Morbid obesity 09/10/2009  . OBSTRUCTIVE SLEEP APNEA 11/04/2007  . DIASTOLIC DYSFUNCTION 99991111  . RENAL INSUFFICIENCY 05/12/2008  . BACK PAIN, LUMBAR 10/23/2007  . CARDIAC MURMUR, SYSTOLIC [id number]  . Edema 05/12/2008    Past Surgical History  Procedure Date  . Abdominal US 09/1997  . Rest cardiolite 03/19/2003  . Lower arterial 04/13/2004  . Electrocardiogram 02/2006  . Colonoscopy 08/18/2004    diverticulitis  . Colonoscopy 09/24/2009  . Colon cancer screening   . Flexible sigmoidoscopy 03/04/2001    polyps, anal fissure    History   Social History  . Marital Status: Married    Spouse Name: N/A    Number of Children: N/A  . Years of Education: N/A   Occupational History  .     Social History Main Topics  . Smoking status: Former Smoker -- 2.0 packs/day for 31 years    Types: Cigarettes    Quit date: 10/23/1982  . Smokeless tobacco: Never Used  . Alcohol Use: No  . Drug Use: No  . Sexually Active: Not on file   Other Topics Concern  . Not on file   Social History Narrative  . No narrative on file    Current Outpatient Prescriptions on File Prior to Visit  Medication Sig Dispense Refill  . alfuzosin  (UROXATRAL) 10 MG 24 hr tablet TAKE 1 TABLET DAILY  90 tablet  3  . allopurinol (ZYLOPRIM) 300 MG tablet TAKE 1 TABLET DAILY  90 tablet  3  . aspirin (BAYER LOW STRENGTH) 81 MG EC tablet Take 81 mg by mouth daily.        . celecoxib (CELEBREX) 200 MG capsule TAKE 1 CAPSULE DAILY  30 capsule  0  . cycloSPORINE (RESTASIS) 0.05 % ophthalmic emulsion Place 1 drop into both eyes 2 (two) times daily.        . folic acid (FOLVITE) 1 MG tablet 4 tabs daily      . furosemide (LASIX) 20 MG tablet Take 1 tablet (20 mg total) by mouth daily.  90 tablet  3  . glucose blood (ONE TOUCH ULTRA TEST) test strip Use as instructedUSE TWICE A DAY  200 each  3  . insulin lispro (HUMALOG) 100 UNIT/ML injection Inject subcutaneously three times a day (just before each meal) 45-25-40 units      . insulin NPH (HUMULIN N,NOVOLIN N) 100 UNIT/ML injection Inject 50 Units into the skin at bedtime.       . Insulin Pen Needle 29G X 12.7MM MISC USE four TIMES A DAY      . Lancets (ONETOUCH ULTRASOFT) lancets  Use as directed dx 250.01  200 each  3  . Multiple Vitamins-Minerals (CENTRUM SILVER PO) Take 1 tablet by mouth daily.        . Omega-3 Fatty Acids (FISH OIL) 1200 MG CAPS Take 1 capsule by mouth daily.        Marland Kitchen omeprazole (PRILOSEC) 20 MG capsule Take 1 capsule (20 mg total) by mouth daily. As needed  90 capsule  3  . rosuvastatin (CRESTOR) 40 MG tablet TAKE 1 TABLET AT BEDTIME  90 tablet  3    Allergies  Allergen Reactions  . Atorvastatin   . Niacin     REACTION: Severe heartburn  . Pioglitazone     REACTION: Edema    Family History  Problem Relation Age of Onset  . Cancer Brother     colon cancer  . Cancer Brother     lung cancer    BP 136/84  Pulse 78  Wt 288 lb (130.636 kg)  SpO2 98%  Review of Systems denies hypoglycemia    Objective:   Physical Exam VITAL SIGNS:  See vs page GENERAL: no distress SKIN:  Insulin injection sites at the anterior abdomen are normal, except for a few ecchymoses.      Lab Results  Component Value Date   HGBA1C 9.2* 11/27/2012      Assessment & Plan:  DM: needs increased rx

## 2013-01-13 ENCOUNTER — Encounter: Payer: Self-pay | Admitting: Endocrinology

## 2013-01-13 ENCOUNTER — Telehealth: Payer: Self-pay | Admitting: Endocrinology

## 2013-01-13 NOTE — Telephone Encounter (Signed)
Pt sent message in to r/s appt w/ Dr. Loanne Drilling. As requested I have called patient and left message on VM with new appt info / Sherri

## 2013-01-14 ENCOUNTER — Other Ambulatory Visit: Payer: Self-pay | Admitting: *Deleted

## 2013-01-14 MED ORDER — ALLOPURINOL 300 MG PO TABS: ORAL_TABLET | ORAL | Status: DC

## 2013-01-14 MED ORDER — INSULIN NPH (HUMAN) (ISOPHANE) 100 UNIT/ML ~~LOC~~ SUSP: SUBCUTANEOUS | Status: DC

## 2013-01-14 MED ORDER — ALFUZOSIN HCL ER 10 MG PO TB24: ORAL_TABLET | ORAL | Status: DC

## 2013-01-14 MED ORDER — INSULIN PEN NEEDLE 29G X 12.7MM MISC: Status: DC

## 2013-01-14 MED ORDER — FUROSEMIDE 20 MG PO TABS: 20.0000 mg | ORAL_TABLET | Freq: Every day | ORAL | Status: DC

## 2013-01-14 MED ORDER — ROSUVASTATIN CALCIUM 40 MG PO TABS: ORAL_TABLET | ORAL | Status: DC

## 2013-01-14 MED ORDER — INSULIN LISPRO 100 UNIT/ML ~~LOC~~ SOLN: SUBCUTANEOUS | Status: DC

## 2013-01-14 MED ORDER — ONETOUCH ULTRASOFT LANCETS MISC: Status: DC

## 2013-01-14 MED ORDER — OMEPRAZOLE 20 MG PO CPDR: 20.0000 mg | DELAYED_RELEASE_CAPSULE | Freq: Every day | ORAL | Status: DC

## 2013-01-14 MED ORDER — CELECOXIB 200 MG PO CAPS: ORAL_CAPSULE | ORAL | Status: DC

## 2013-01-14 MED ORDER — TRAMADOL HCL 50 MG PO TABS: ORAL_TABLET | ORAL | Status: DC

## 2013-01-14 NOTE — Telephone Encounter (Signed)
Per Dr Loanne Drilling, please refill all prn.

## 2013-01-20 ENCOUNTER — Other Ambulatory Visit: Payer: Self-pay

## 2013-01-20 MED ORDER — ROSUVASTATIN CALCIUM 40 MG PO TABS: ORAL_TABLET | ORAL | Status: DC

## 2013-01-20 MED ORDER — INSULIN NPH (HUMAN) (ISOPHANE) 100 UNIT/ML ~~LOC~~ SUSP: SUBCUTANEOUS | Status: DC

## 2013-01-20 MED ORDER — ONETOUCH ULTRASOFT LANCETS MISC: Status: DC

## 2013-02-21 ENCOUNTER — Ambulatory Visit (INDEPENDENT_AMBULATORY_CARE_PROVIDER_SITE_OTHER): Payer: Medicare Other | Admitting: Endocrinology

## 2013-02-21 ENCOUNTER — Encounter: Payer: Self-pay | Admitting: Endocrinology

## 2013-02-21 ENCOUNTER — Ambulatory Visit
Admission: RE | Admit: 2013-02-21 | Discharge: 2013-02-21 | Disposition: A | Discharge status: Home / Self Care | Payer: Medicare Other | Source: Ambulatory Visit | Attending: Endocrinology | Admitting: Endocrinology

## 2013-02-21 VITALS — BP 132/74 | HR 120 | Temp 98.4°F | Wt 278.0 lb

## 2013-02-21 DIAGNOSIS — E1029 Type 1 diabetes mellitus with other diabetic kidney complication: Secondary | ICD-10-CM

## 2013-02-21 DIAGNOSIS — Z79899 Other long term (current) drug therapy: Secondary | ICD-10-CM

## 2013-02-21 DIAGNOSIS — R059 Cough, unspecified: Secondary | ICD-10-CM

## 2013-02-21 DIAGNOSIS — R05 Cough: Secondary | ICD-10-CM

## 2013-02-21 DIAGNOSIS — E785 Hyperlipidemia, unspecified: Secondary | ICD-10-CM

## 2013-02-21 DIAGNOSIS — I10 Essential (primary) hypertension: Secondary | ICD-10-CM

## 2013-02-21 DIAGNOSIS — Z125 Encounter for screening for malignant neoplasm of prostate: Secondary | ICD-10-CM

## 2013-02-21 DIAGNOSIS — M109 Gout, unspecified: Secondary | ICD-10-CM

## 2013-02-21 LAB — URINALYSIS, ROUTINE W REFLEX MICROSCOPIC
Bilirubin Urine: NEGATIVE
Leukocytes, UA: NEGATIVE
Nitrite: NEGATIVE
Total Protein, Urine: 30
pH: 5.5 (ref 5.0–8.0)

## 2013-02-21 LAB — CBC WITH DIFFERENTIAL/PLATELET
Basophils Absolute: 0 10*3/uL (ref 0.0–0.1)
Eosinophils Relative: 1.3 % (ref 0.0–5.0)
HCT: 46.1 % (ref 39.0–52.0)
Lymphs Abs: 1.8 10*3/uL (ref 0.7–4.0)
Monocytes Absolute: 0.4 10*3/uL (ref 0.1–1.0)
Monocytes Relative: 4.6 % (ref 3.0–12.0)
Neutrophils Relative %: 72.1 % (ref 43.0–77.0)
Platelets: 113 10*3/uL — ABNORMAL LOW (ref 150.0–400.0)
RDW: 13.9 % (ref 11.5–14.6)
WBC: 8.4 10*3/uL (ref 4.5–10.5)

## 2013-02-21 LAB — PSA: PSA: 2.16 ng/mL (ref 0.10–4.00)

## 2013-02-21 LAB — TSH: TSH: 3.8 u[IU]/mL (ref 0.35–5.50)

## 2013-02-21 LAB — HEMOGLOBIN A1C: Hgb A1c MFr Bld: 8.2 % — ABNORMAL HIGH (ref 4.6–6.5)

## 2013-02-21 MED ORDER — FLUTICASONE-SALMETEROL 100-50 MCG/DOSE IN AEPB: 1.0000 | INHALATION_SPRAY | Freq: Two times a day (BID) | RESPIRATORY_TRACT | Status: DC

## 2013-02-21 MED ORDER — CEFUROXIME AXETIL 250 MG PO TABS: 250.0000 mg | ORAL_TABLET | Freq: Two times a day (BID) | ORAL | Status: AC

## 2013-02-21 MED ORDER — PROMETHAZINE-CODEINE 6.25-10 MG/5ML PO SYRP: 5.0000 mL | ORAL_SOLUTION | ORAL | Status: DC | PRN

## 2013-02-21 NOTE — Progress Notes (Signed)
Subjective:    Patient ID: [name], male    DOB: [birth year], 72 y.o.   MRN: [id number]  HPI Pt states few days of slight wheezing in the chest, and assoc nasal congestion.   Past Medical History  Diagnosis Date  . Hepatic steatosis   . Urolithiasis   . Hypogonadism male   . Gynecomastia   . Anal fissure   . DM type 1 with diabetic peripheral neuropathy   . Personal history of colonic polyps   . Anemia, unspecified   . Duodenitis   . Esophageal stricture   . Arthritis     Spinal OA  . DIABETES MELLITUS, TYPE I 05/20/2007  . HYPERLIPIDEMIA 05/20/2007  . 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 Morbid obesity 09/10/2009  . OBSTRUCTIVE SLEEP APNEA 11/04/2007  . DIASTOLIC DYSFUNCTION 99991111  . RENAL INSUFFICIENCY 05/12/2008  . BACK PAIN, LUMBAR 10/23/2007  . CARDIAC MURMUR, SYSTOLIC [id number]  . Edema 05/12/2008    Past Surgical History  Procedure Laterality Date  . Abdominal US  09/1997  . Rest cardiolite  03/19/2003  . Lower arterial  04/13/2004  . Electrocardiogram  02/2006  . Colonoscopy  08/18/2004    diverticulitis  . Colonoscopy  09/24/2009  . Colon cancer screening    . Flexible sigmoidoscopy  03/04/2001    polyps, anal fissure    History   Social History  . Marital Status: Married    Spouse Name: N/A    Number of Children: N/A  . Years of Education: N/A   Occupational History  .     Social History Main Topics  . Smoking status: Former Smoker -- 2.00 packs/day for 31 years    Types: Cigarettes    Quit date: 10/23/1982  . Smokeless tobacco: Never Used  . Alcohol Use: No  . Drug Use: No  . Sexually Active: Not on file   Other Topics Concern  . Not on file   Social History Narrative  . No narrative on file    Current Outpatient Prescriptions on File Prior to Visit  Medication Sig Dispense Refill  . alfuzosin (UROXATRAL) 10 MG 24 hr tablet TAKE 1 TABLET DAILY  90 tablet  prn  . allopurinol (ZYLOPRIM) 300 MG tablet TAKE 1 TABLET DAILY  90 tablet  prn  . aspirin (BAYER  LOW STRENGTH) 81 MG EC tablet Take 81 mg by mouth daily.        . celecoxib (CELEBREX) 200 MG capsule TAKE 1 CAPSULE DAILY  30 capsule  prn  . cycloSPORINE (RESTASIS) 0.05 % ophthalmic emulsion Place 1 drop into both eyes 2 (two) times daily.        . folic acid (FOLVITE) 1 MG tablet 4 tabs daily      . furosemide (LASIX) 20 MG tablet Take 1 tablet (20 mg total) by mouth daily.  90 tablet  prn  . glucose blood (ONE TOUCH ULTRA TEST) test strip Use as instructedUSE TWICE A DAY  200 each  3  . insulin lispro (HUMALOG) 100 UNIT/ML injection Inject subcutaneously three times a day (just before each meal) 45-25-40 units      . insulin NPH (HUMULIN N,NOVOLIN N) 100 UNIT/ML injection Inject 50 Units into the skin at bedtime.       . Insulin Pen Needle 29G X 12.7MM MISC USE 4 (FOUR) TIMES A DAY  200 each  prn  . Lancets (ONETOUCH ULTRASOFT) lancets Use as directed dx 250.01  200 each  prn  . Multiple Vitamins-Minerals (CENTRUM SILVER PO) Take  1 tablet by mouth daily.        . Omega-3 Fatty Acids (FISH OIL) 1200 MG CAPS Take 1 capsule by mouth daily.        Marland Kitchen omeprazole (PRILOSEC) 20 MG capsule Take 1 capsule (20 mg total) by mouth daily. As needed  90 capsule  prn  . rosuvastatin (CRESTOR) 40 MG tablet TAKE 1 TABLET AT BEDTIME  90 tablet  prn  . traMADol (ULTRAM) 50 MG tablet Take 1 tablet by mouth every 4 (four) hours as needed for pain.  100 tablet  prn   No current facility-administered medications on file prior to visit.    Allergies  Allergen Reactions  . Atorvastatin   . Niacin     REACTION: Severe heartburn  . Pioglitazone     REACTION: Edema    Family History  Problem Relation Age of Onset  . Cancer Brother     colon cancer  . Cancer Brother     lung cancer    BP 132/74  Pulse 120  Temp(Src) 98.4 F (36.9 C)  Wt 278 lb (126.1 kg)  BMI 37.7 kg/m2  SpO2 94%  Review of Systems He has a prod-quality cough, but no sob.     Objective:   Physical Exam VITAL SIGNS:  See vs  page. GENERAL: no distress head: no deformity eyes: no periorbital swelling, no proptosis external nose and ears are normal mouth: no lesion seen Both tm's are red LUNGS:  Clear to auscultation, except for rales at the bases  Cxr: nad    Assessment & Plan:  Acute bronchitis, new

## 2013-02-21 NOTE — Patient Instructions (Addendum)
blood tests and a chest-x-ray are being requested for you today.  We'll contact you with results. Loratadine-d (non-prescription) will help your congestion.  Here are 3 prescriptions: cough syrup, inhaler, and antibiotic.

## 2013-02-24 LAB — LIPID PANEL
Cholesterol: 124 mg/dL (ref 0–200)
LDL Cholesterol: 64 mg/dL (ref 0–99)
Total CHOL/HDL Ratio: 4
Triglycerides: 158 mg/dL — ABNORMAL HIGH (ref 0.0–149.0)
VLDL: 31.6 mg/dL (ref 0.0–40.0)

## 2013-02-24 LAB — BASIC METABOLIC PANEL
BUN: 21 mg/dL (ref 6–23)
Calcium: 8.8 mg/dL (ref 8.4–10.5)
Creatinine, Ser: 1.6 mg/dL — ABNORMAL HIGH (ref 0.4–1.5)
GFR: 46.82 mL/min — ABNORMAL LOW (ref >60.00)

## 2013-02-24 LAB — MICROALBUMIN / CREATININE URINE RATIO: Microalb, Ur: 7.4 mg/dL — ABNORMAL HIGH (ref 0.0–1.9)

## 2013-02-24 LAB — HEPATIC FUNCTION PANEL
ALT: 39 U/L (ref 0–53)
Albumin: 3.8 g/dL (ref 3.5–5.2)
Bilirubin, Direct: 0.2 mg/dL (ref 0.0–0.3)
Total Protein: 6.9 g/dL (ref 6.0–8.3)

## 2013-02-25 ENCOUNTER — Ambulatory Visit: Payer: Medicare Other | Admitting: Endocrinology

## 2013-03-12 ENCOUNTER — Ambulatory Visit (INDEPENDENT_AMBULATORY_CARE_PROVIDER_SITE_OTHER): Payer: Medicare Other | Admitting: Endocrinology

## 2013-03-12 ENCOUNTER — Encounter: Payer: Self-pay | Admitting: Endocrinology

## 2013-03-12 VITALS — BP 126/80 | HR 76 | Ht 72.0 in | Wt 281.0 lb

## 2013-03-12 DIAGNOSIS — E1029 Type 1 diabetes mellitus with other diabetic kidney complication: Secondary | ICD-10-CM

## 2013-03-12 DIAGNOSIS — N390 Urinary tract infection, site not specified: Secondary | ICD-10-CM | POA: Insufficient documentation

## 2013-03-12 LAB — URINALYSIS, ROUTINE W REFLEX MICROSCOPIC
Specific Gravity, Urine: 1.01 (ref 1.000–1.030)
Urine Glucose: 250
Urobilinogen, UA: 0.2 (ref 0.0–1.0)
pH: 6.5 (ref 5.0–8.0)

## 2013-03-12 MED ORDER — CIPROFLOXACIN HCL 500 MG PO TABS: 500.0000 mg | ORAL_TABLET | Freq: Two times a day (BID) | ORAL | Status: DC

## 2013-03-12 NOTE — Progress Notes (Signed)
Subjective:    Patient ID: [name], male    DOB: 07-23-1941, 72 y.o.   MRN: [id number]  HPI Pt returns for f/u of insulin-requiring DM (dx'ed 1986; he has mild neuropathy of the lower extremities; he has associated renal insufficiency). no cbg record, but states cbg's are highest in the afternoon.  He denies hypoglycemia. Pt states few days of slight pain at the perineal area, in the context of urination, but no assoc fever.   Past Medical History  Diagnosis Date  . Hepatic steatosis   . Urolithiasis   . Hypogonadism male   . Gynecomastia   . Anal fissure   . DM type 1 with diabetic peripheral neuropathy   . Personal history of colonic polyps   . Anemia, unspecified   . Duodenitis   . Esophageal stricture   . Arthritis     Spinal OA  . DIABETES MELLITUS, TYPE I 05/20/2007  . HYPERLIPIDEMIA 05/20/2007  . 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 Morbid obesity 09/10/2009  . OBSTRUCTIVE SLEEP APNEA 11/04/2007  . DIASTOLIC DYSFUNCTION [id number]  . RENAL INSUFFICIENCY 05/12/2008  . BACK PAIN, LUMBAR 10/23/2007  . CARDIAC MURMUR, SYSTOLIC [id number]  . Edema 05/12/2008    Past Surgical History  Procedure Laterality Date  . Abdominal US  09/1997  . Rest cardiolite  03/19/2003  . Lower arterial  04/13/2004  . Electrocardiogram  02/2006  . Colonoscopy  08/18/2004    diverticulitis  . Colonoscopy  09/24/2009  . Colon cancer screening    . Flexible sigmoidoscopy  03/04/2001    polyps, anal fissure    History   Social History  . Marital Status: Married    Spouse Name: N/A    Number of Children: N/A  . Years of Education: N/A   Occupational History  .     Social History Main Topics  . Smoking status: Former Smoker -- 2.00 packs/day for 31 years    Types: Cigarettes    Quit date: 10/23/1982  . Smokeless tobacco: Never Used  . Alcohol Use: No  . Drug Use: No  . Sexually Active: Not on file   Other Topics Concern  . Not on file   Social History Narrative  . No narrative on file    Current  Outpatient Prescriptions on File Prior to Visit  Medication Sig Dispense Refill  . alfuzosin (UROXATRAL) 10 MG 24 hr tablet TAKE 1 TABLET DAILY  90 tablet  prn  . allopurinol (ZYLOPRIM) 300 MG tablet TAKE 1 TABLET DAILY  90 tablet  prn  . aspirin (BAYER LOW STRENGTH) 81 MG EC tablet Take 81 mg by mouth daily.        . celecoxib (CELEBREX) 200 MG capsule TAKE 1 CAPSULE DAILY  30 capsule  prn  . cycloSPORINE (RESTASIS) 0.05 % ophthalmic emulsion Place 1 drop into both eyes 2 (two) times daily.        . Fluticasone-Salmeterol (ADVAIR DISKUS) 100-50 MCG/DOSE AEPB Inhale 1 puff into the lungs 2 (two) times daily.  1 each  0  . folic acid (FOLVITE) 1 MG tablet 4 tabs daily      . furosemide (LASIX) 20 MG tablet Take 1 tablet (20 mg total) by mouth daily.  90 tablet  prn  . glucose blood (ONE TOUCH ULTRA TEST) test strip Use as instructedUSE TWICE A DAY  200 each  3  . insulin lispro (HUMALOG) 100 UNIT/ML injection Inject subcutaneously three times a day (just before each meal) 45-30-40 units      .  insulin NPH (HUMULIN N,NOVOLIN N) 100 UNIT/ML injection Inject 50 Units into the skin at bedtime.       . Insulin Pen Needle 29G X 12.7MM MISC USE 4 (FOUR) TIMES A DAY  200 each  prn  . Lancets (ONETOUCH ULTRASOFT) lancets Use as directed dx 250.01  200 each  prn  . Multiple Vitamins-Minerals (CENTRUM SILVER PO) Take 1 tablet by mouth daily.        . Omega-3 Fatty Acids (FISH OIL) 1200 MG CAPS Take 1 capsule by mouth daily.        Marland Kitchen omeprazole (PRILOSEC) 20 MG capsule Take 1 capsule (20 mg total) by mouth daily. As needed  90 capsule  prn  . promethazine-codeine (PHENERGAN WITH CODEINE) 6.25-10 MG/5ML syrup Take 5 mLs by mouth every 4 (four) hours as needed for cough.  240 mL  0  . rosuvastatin (CRESTOR) 40 MG tablet TAKE 1 TABLET AT BEDTIME  90 tablet  prn  . traMADol (ULTRAM) 50 MG tablet Take 1 tablet by mouth every 4 (four) hours as needed for pain.  100 tablet  prn   No current facility-administered  medications on file prior to visit.    Allergies  Allergen Reactions  . Atorvastatin   . Niacin     REACTION: Severe heartburn  . Pioglitazone     REACTION: Edema    Family History  Problem Relation Age of Onset  . Cancer Brother     colon cancer  . Cancer Brother     lung cancer    BP 126/80  Pulse 76  Ht 6' (1.829 m)  Wt 281 lb (127.461 kg)  BMI 38.1 kg/m2  SpO2 96%  Review of Systems Denies hematuria and n/v    Objective:   Physical Exam VITAL SIGNS:  See vs page GENERAL: no distress Perineal area: slight tenderness.       Assessment & Plan:  Prostatitis, new DM: he needs increased rx.  This insulin regimen was chosen from multiple options, as it best matches his insulin to his changing requirements throughout the day.

## 2013-03-12 NOTE — Patient Instructions (Addendum)
A urine test is requested for you today.  We'll contact you with results. i have sent a prescription to your pharmacy, for an antibiotic pill.   Please increase your lunch insulin to 30 units.  Please come in for your regular physical as scheduled.

## 2013-03-14 ENCOUNTER — Ambulatory Visit (INDEPENDENT_AMBULATORY_CARE_PROVIDER_SITE_OTHER): Payer: Medicare Other | Admitting: Endocrinology

## 2013-03-15 LAB — URINE CULTURE: Colony Count: 50000

## 2013-03-20 ENCOUNTER — Encounter: Payer: Self-pay | Admitting: Endocrinology

## 2013-03-20 ENCOUNTER — Ambulatory Visit (INDEPENDENT_AMBULATORY_CARE_PROVIDER_SITE_OTHER): Payer: Medicare Other | Admitting: Endocrinology

## 2013-03-20 VITALS — BP 124/80 | HR 87 | Ht 72.0 in | Wt 280.0 lb

## 2013-03-20 DIAGNOSIS — Z0000 Encounter for general adult medical examination without abnormal findings: Secondary | ICD-10-CM

## 2013-03-20 DIAGNOSIS — N390 Urinary tract infection, site not specified: Secondary | ICD-10-CM

## 2013-03-20 DIAGNOSIS — Z23 Encounter for immunization: Secondary | ICD-10-CM

## 2013-03-20 NOTE — Progress Notes (Signed)
Subjective:    Patient ID: [name], male    DOB: 1941-10-15, 72 y.o.   MRN: [id number]  HPI Pt is here for regular wellness examination, and is feeling pretty well in general, and says chronic med probs are stable, except as noted below Past Medical History  Diagnosis Date  . Hepatic steatosis   . Urolithiasis   . Hypogonadism male   . Gynecomastia   . Anal fissure   . DM type 1 with diabetic peripheral neuropathy   . Personal history of colonic polyps   . Anemia, unspecified   . Duodenitis   . Esophageal stricture   . Arthritis     Spinal OA  . DIABETES MELLITUS, TYPE I 05/20/2007  . HYPERLIPIDEMIA 05/20/2007  . 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 Morbid obesity 09/10/2009  . OBSTRUCTIVE SLEEP APNEA 11/04/2007  . DIASTOLIC DYSFUNCTION 99991111  . RENAL INSUFFICIENCY 05/12/2008  . BACK PAIN, LUMBAR 10/23/2007  . CARDIAC MURMUR, SYSTOLIC [id number]  . Edema 05/12/2008    Past Surgical History  Procedure Laterality Date  . Abdominal US  09/1997  . Rest cardiolite  03/19/2003  . Lower arterial  04/13/2004  . Electrocardiogram  02/2006  . Colonoscopy  08/18/2004    diverticulitis  . Colonoscopy  09/24/2009  . Colon cancer screening    . Flexible sigmoidoscopy  03/04/2001    polyps, anal fissure    History   Social History  . Marital Status: Married    Spouse Name: N/A    Number of Children: N/A  . Years of Education: N/A   Occupational History  .     Social History Main Topics  . Smoking status: Former Smoker -- 2.00 packs/day for 31 years    Types: Cigarettes    Quit date: 10/23/1982  . Smokeless tobacco: Never Used  . Alcohol Use: No  . Drug Use: No  . Sexually Active: Not on file   Other Topics Concern  . Not on file   Social History Narrative  . No narrative on file    Current Outpatient Prescriptions on File Prior to Visit  Medication Sig Dispense Refill  . alfuzosin (UROXATRAL) 10 MG 24 hr tablet TAKE 1 TABLET DAILY  90 tablet  prn  . allopurinol (ZYLOPRIM) 300 MG  tablet TAKE 1 TABLET DAILY  90 tablet  prn  . aspirin (BAYER LOW STRENGTH) 81 MG EC tablet Take 81 mg by mouth daily.        . celecoxib (CELEBREX) 200 MG capsule TAKE 1 CAPSULE DAILY  30 capsule  prn  . ciprofloxacin (CIPRO) 500 MG tablet Take 1 tablet (500 mg total) by mouth 2 (two) times daily.  20 tablet  0  . cycloSPORINE (RESTASIS) 0.05 % ophthalmic emulsion Place 1 drop into both eyes 2 (two) times daily.        . Fluticasone-Salmeterol (ADVAIR DISKUS) 100-50 MCG/DOSE AEPB Inhale 1 puff into the lungs 2 (two) times daily.  1 each  0  . folic acid (FOLVITE) 1 MG tablet 4 tabs daily      . furosemide (LASIX) 20 MG tablet Take 1 tablet (20 mg total) by mouth daily.  90 tablet  prn  . glucose blood (ONE TOUCH ULTRA TEST) test strip Use as instructedUSE TWICE A DAY  200 each  3  . insulin lispro (HUMALOG) 100 UNIT/ML injection Inject subcutaneously three times a day (just before each meal) 45-35-40 units      . insulin NPH (HUMULIN N,NOVOLIN N) 100 UNIT/ML injection Inject  50 Units into the skin at bedtime.       . Insulin Pen Needle 29G X 12.7MM MISC USE 4 (FOUR) TIMES A DAY  200 each  prn  . Lancets (ONETOUCH ULTRASOFT) lancets Use as directed dx 250.01  200 each  prn  . Multiple Vitamins-Minerals (CENTRUM SILVER PO) Take 1 tablet by mouth daily.        . Omega-3 Fatty Acids (FISH OIL) 1200 MG CAPS Take 1 capsule by mouth daily.        Marland Kitchen omeprazole (PRILOSEC) 20 MG capsule Take 1 capsule (20 mg total) by mouth daily. As needed  90 capsule  prn  . promethazine-codeine (PHENERGAN WITH CODEINE) 6.25-10 MG/5ML syrup Take 5 mLs by mouth every 4 (four) hours as needed for cough.  240 mL  0  . rosuvastatin (CRESTOR) 40 MG tablet TAKE 1 TABLET AT BEDTIME  90 tablet  prn  . traMADol (ULTRAM) 50 MG tablet Take 1 tablet by mouth every 4 (four) hours as needed for pain.  100 tablet  prn   No current facility-administered medications on file prior to visit.    Allergies  Allergen Reactions  .  Atorvastatin   . Niacin     REACTION: Severe heartburn  . Pioglitazone     REACTION: Edema    Family History  Problem Relation Age of Onset  . Cancer Brother     colon cancer  . Cancer Brother     lung cancer    BP 124/80  Pulse 87  Ht 6' (1.829 m)  Wt 280 lb (127.007 kg)  BMI 37.97 kg/m2  SpO2 98%     Review of Systems  Constitutional: Negative for fever and unexpected weight change.  HENT: Negative for hearing loss.   Eyes: Negative for visual disturbance.  Respiratory: Negative for shortness of breath.   Cardiovascular: Negative for chest pain.  Gastrointestinal: Negative for anal bleeding.  Endocrine: Positive for cold intolerance.  Genitourinary: Negative for hematuria.  Musculoskeletal: Positive for back pain.  Skin: Negative for rash.  Allergic/Immunologic: Positive for environmental allergies.  Neurological: Negative for syncope and headaches.  Hematological: Bruises/bleeds easily.  Psychiatric/Behavioral: Negative for dysphoric mood.       Objective:   Physical Exam VS: see vs page GEN: no distress HEAD: head: no deformity eyes: no periorbital swelling, no proptosis external nose and ears are normal mouth: no lesion seen NECK: supple, thyroid is not enlarged CHEST WALL: no deformity LUNGS: clear to auscultation BREASTS:  No gynecomastia ABD: abdomen is soft, nontender.  no hepatosplenomegaly.  not distended.  no hernia.  GENITALIA/RECTAL/PROSTATE:  sees urology.   MUSCULOSKELETAL: muscle bulk and strength are grossly normal.  no obvious joint swelling.  gait is normal and steady.   PULSES: no carotid bruit NEURO:  cn 2-12 grossly intact.   readily moves all 4's.   SKIN:  Normal texture and temperature.  No rash or suspicious lesion is visible.   NODES:  None palpable at the neck. PSYCH: alert, oriented x3.  Does not appear anxious nor depressed.       Assessment & Plan:  Wellness visit today, with problems stable, except as noted. we  discussed code status.  pt requests full code, but would not want to be started or maintained on artificial life-support measures if there was not a reasonable chance of recovery.       SEPARATE EVALUATION FOLLOWS--EACH PROBLEM HERE IS NEW, NOT RESPONDING TO TREATMENT, OR POSES SIGNIFICANT RISK TO THE PATIENT'S HEALTH:  HISTORY OF THE PRESENT ILLNESS:   Pt returns for f/u of insulin-requiring DM (dx'ed 1986; he has mild neuropathy of the lower extremities; he has associated renal insufficiency). no cbg record, but states cbg's are highest in the afternoon.  He denies hypoglycemia.   Pt says he feels better since abx were started, but he still has urinary hesitancy.   PAST MEDICAL HISTORY reviewed and up to date today.   REVIEW OF SYSTEMS:  PHYSICAL EXAMINATION: VITAL SIGNS:  See vs page. GENERAL: no distress CV: reg rate and rhythm, no murmur. LAB/XRAY RESULTS: Lab Results  Component Value Date   HGBA1C 8.2* 02/21/2013  IMPRESSION: UTI, symptomatically better DM: needs increased rx Prostatism, persistent. PLAN: See instruction page.

## 2013-03-20 NOTE — Patient Instructions (Addendum)
please consider these measures for your health:  minimize alcohol.  do not use tobacco products.  have a colonoscopy at least every 10 years from age 72.  keep firearms safely stored.  always use seat belts.  have working smoke alarms in your home.  see an eye doctor and dentist regularly.  never drive under the influence of alcohol or drugs (including prescription drugs).  those with fair skin should take precautions against the sun. Refer to a urology specialist.  you will receive a phone call, about a day and time for an appointment. Please increase your lunch insulin to 35 units. Please come back for a follow-up appointment in 3 months.

## 2013-03-25 ENCOUNTER — Encounter: Payer: Self-pay | Admitting: Endocrinology

## 2013-06-20 ENCOUNTER — Encounter: Payer: Self-pay | Admitting: Pulmonary Disease

## 2013-06-20 ENCOUNTER — Ambulatory Visit (INDEPENDENT_AMBULATORY_CARE_PROVIDER_SITE_OTHER): Payer: Medicare Other | Admitting: Endocrinology

## 2013-06-20 ENCOUNTER — Ambulatory Visit (INDEPENDENT_AMBULATORY_CARE_PROVIDER_SITE_OTHER): Payer: Medicare Other | Admitting: Pulmonary Disease

## 2013-06-20 ENCOUNTER — Encounter: Payer: Self-pay | Admitting: Endocrinology

## 2013-06-20 VITALS — BP 124/76 | HR 84 | Ht 72.0 in | Wt 289.0 lb

## 2013-06-20 VITALS — BP 164/72 | HR 82 | Temp 97.0°F | Ht 72.0 in | Wt 290.6 lb

## 2013-06-20 DIAGNOSIS — G4733 Obstructive sleep apnea (adult) (pediatric): Secondary | ICD-10-CM

## 2013-06-20 DIAGNOSIS — E1029 Type 1 diabetes mellitus with other diabetic kidney complication: Secondary | ICD-10-CM

## 2013-06-20 NOTE — Patient Instructions (Addendum)
Continue on cpap, and try to change out cushions more frequently or try different mask. Work on weight loss followup with me in one year.

## 2013-06-20 NOTE — Assessment & Plan Note (Signed)
The patient is wearing CPAP compliantly, and feels that it continues to help his sleep and daytime alertness.  He is having some issues with his mask, but I think this would be resolved by changing his cushion every few months.  I have also encouraged him to work aggressively on weight loss.

## 2013-06-20 NOTE — Progress Notes (Signed)
Subjective:    Patient ID: [name], male    DOB: 02/19/1941, 72 y.o.   MRN: [id number]  HPI Pt returns for f/u of insulin-requiring DM (dx'ed 1986; he has mild neuropathy of the lower extremities; and associated renal insufficiency).  he brings a scant record of his cbg's which i have reviewed today.  It varies from 58 (lunch and afternoon) to 200 (hs).  pt states he feels well in general. Past Medical History  Diagnosis Date  . Hepatic steatosis   . Urolithiasis   . Hypogonadism male   . Gynecomastia   . Anal fissure   . DM type 1 with diabetic peripheral neuropathy   . Personal history of colonic polyps   . Anemia, unspecified   . Duodenitis   . Esophageal stricture   . Arthritis     Spinal OA  . DIABETES MELLITUS, TYPE I 05/20/2007  . HYPERLIPIDEMIA 05/20/2007  . 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 Morbid obesity 09/10/2009  . OBSTRUCTIVE SLEEP APNEA 11/04/2007  . DIASTOLIC DYSFUNCTION 99991111  . RENAL INSUFFICIENCY 05/12/2008  . BACK PAIN, LUMBAR 10/23/2007  . CARDIAC MURMUR, SYSTOLIC [id number]  . Edema 05/12/2008    Past Surgical History  Procedure Laterality Date  . Abdominal US  09/1997  . Rest cardiolite  03/19/2003  . Lower arterial  04/13/2004  . Electrocardiogram  02/2006  . Colonoscopy  08/18/2004    diverticulitis  . Colonoscopy  09/24/2009  . Colon cancer screening    . Flexible sigmoidoscopy  03/04/2001    polyps, anal fissure    History   Social History  . Marital Status: Married    Spouse Name: N/A    Number of Children: N/A  . Years of Education: N/A   Occupational History  .     Social History Main Topics  . Smoking status: Former Smoker -- 2.00 packs/day for 31 years    Types: Cigarettes    Quit date: 10/23/1982  . Smokeless tobacco: Never Used  . Alcohol Use: No  . Drug Use: No  . Sexual Activity: Not on file   Other Topics Concern  . Not on file   Social History Narrative  . No narrative on file    Current Outpatient Prescriptions on File Prior  to Visit  Medication Sig Dispense Refill  . alfuzosin (UROXATRAL) 10 MG 24 hr tablet TAKE 1 TABLET DAILY  90 tablet  prn  . allopurinol (ZYLOPRIM) 300 MG tablet TAKE 1 TABLET DAILY  90 tablet  prn  . aspirin (BAYER LOW STRENGTH) 81 MG EC tablet Take 81 mg by mouth daily.        . celecoxib (CELEBREX) 200 MG capsule TAKE 1 CAPSULE DAILY  30 capsule  prn  . cycloSPORINE (RESTASIS) 0.05 % ophthalmic emulsion Place 1 drop into both eyes 2 (two) times daily.        . folic acid (FOLVITE) 1 MG tablet 4 tabs daily      . furosemide (LASIX) 20 MG tablet Take 1 tablet (20 mg total) by mouth daily.  90 tablet  prn  . glucose blood (ONE TOUCH ULTRA TEST) test strip Use as instructedUSE TWICE A DAY  200 each  3  . insulin lispro (HUMALOG) 100 UNIT/ML injection Inject subcutaneously three times a day (just before each meal) 40-35-50 units      . insulin NPH (HUMULIN N,NOVOLIN N) 100 UNIT/ML injection Inject 50 Units into the skin at bedtime.       . Insulin Pen Needle 29G  X 12.7MM MISC USE 4 (FOUR) TIMES A DAY  200 each  prn  . Lancets (ONETOUCH ULTRASOFT) lancets Use as directed dx 250.01  200 each  prn  . Multiple Vitamins-Minerals (CENTRUM SILVER PO) Take 1 tablet by mouth daily.        . Omega-3 Fatty Acids (FISH OIL) 1200 MG CAPS Take 1 capsule by mouth daily.        Marland Kitchen omeprazole (PRILOSEC) 20 MG capsule Take 1 capsule (20 mg total) by mouth daily. As needed  90 capsule  prn  . rosuvastatin (CRESTOR) 40 MG tablet TAKE 1 TABLET AT BEDTIME  90 tablet  prn  . traMADol (ULTRAM) 50 MG tablet Take 1 tablet by mouth every 4 (four) hours as needed for pain.  100 tablet  prn   No current facility-administered medications on file prior to visit.    Allergies  Allergen Reactions  . Atorvastatin   . Niacin     REACTION: Severe heartburn  . Pioglitazone     REACTION: Edema    Family History  Problem Relation Age of Onset  . Cancer Brother     colon cancer  . Cancer Brother     lung cancer    BP  124/76  Pulse 84  Ht 6' (1.829 m)  Wt 289 lb (131.09 kg)  BMI 39.19 kg/m2  SpO2 98%  Review of Systems Denies LOC.  He has gained weight.      Objective:   Physical Exam VITAL SIGNS:  See vs page GENERAL: no distress SKIN:  Insulin injection sites at the anterior abdomen are normal, except for a few ecchymoses.  Lab Results  Component Value Date   HGBA1C 7.9* 06/20/2013      Assessment & Plan:  DM: he needs increased rx.  This insulin regimen was chosen from multiple options, as it best matches his insulin to his changing requirements throughout the day.  The benefits of glycemic control must be weighed against the risks of hypoglycemia.   Morbid obesity.  This limits rx of DM Renal insufficiency: this increases the duration of action of insulin.

## 2013-06-20 NOTE — Progress Notes (Signed)
  Subjective:    Patient ID: [name], male    DOB: 26-Nov-1940, 72 y.o.   MRN: [id number]  HPI Patient comes in today for followup of his obstructive sleep apnea.  He is wearing CPAP compliantly, and is having no significant issue with the pressure.  He is having some mask leaks, and is having to pull the mask type in order to get a decent seal.  He is not changing his cushions regularly enough, and this may be the issue.  He does feel that he sleeps well with the device, and has excellent alertness during the day.  Of note, his weight is up 10 pounds since the last visit.   Review of Systems  Constitutional: Negative for fever and unexpected weight change.  HENT: Negative for ear pain, nosebleeds, congestion, sore throat, rhinorrhea, sneezing, trouble swallowing, dental problem, postnasal drip and sinus pressure.   Eyes: Negative for redness and itching.  Respiratory: Negative for cough, chest tightness, shortness of breath and wheezing.   Cardiovascular: Negative for palpitations and leg swelling.  Gastrointestinal: Negative for nausea and vomiting.  Genitourinary: Negative for dysuria.  Musculoskeletal: Negative for joint swelling.  Skin: Positive for rash ( facial redness bridge of nose).  Neurological: Negative for headaches.  Hematological: Does not bruise/bleed easily.  Psychiatric/Behavioral: Negative for dysphoric mood. The patient is not nervous/anxious.        Objective:   Physical Exam Morbidly obese male in no acute distress Nose without purulence or discharge noted No skin breakdown or pressure necrosis from the CPAP mask Lower extremities with mild edema, no cyanosis Alert and oriented, moves all 4 extremities.       Assessment & Plan:

## 2013-06-20 NOTE — Patient Instructions (Addendum)
check your blood sugar twice a day.  vary the time of day when you check, between before the 3 meals, and at bedtime.  also check if you have symptoms of your blood sugar being too high or too low.  please keep a record of the readings and bring it to your next appointment here.  please call us sooner if your blood sugar goes below 70, or if you have a lot of readings over 200.  blood tests are being requested for you today.  We'll contact you with results.  Please increase humalog to three times a day (just before each meal) 40-35-50.   Please come back for a follow-up appointment in 3 months.

## 2013-07-30 ENCOUNTER — Other Ambulatory Visit: Payer: Self-pay | Admitting: *Deleted

## 2013-07-30 MED ORDER — FUROSEMIDE 20 MG PO TABS: 20.0000 mg | ORAL_TABLET | Freq: Every day | ORAL | Status: DC

## 2013-08-01 ENCOUNTER — Other Ambulatory Visit: Payer: Self-pay

## 2013-08-01 MED ORDER — FUROSEMIDE 20 MG PO TABS: 20.0000 mg | ORAL_TABLET | Freq: Every day | ORAL | Status: DC

## 2013-08-28 ENCOUNTER — Other Ambulatory Visit: Payer: Self-pay

## 2013-09-26 ENCOUNTER — Ambulatory Visit (INDEPENDENT_AMBULATORY_CARE_PROVIDER_SITE_OTHER): Payer: Medicare Other | Admitting: Endocrinology

## 2013-09-26 ENCOUNTER — Encounter: Payer: Self-pay | Admitting: Endocrinology

## 2013-09-26 VITALS — BP 122/52 | HR 81 | Temp 97.8°F | Ht 72.0 in | Wt 287.6 lb

## 2013-09-26 DIAGNOSIS — E1029 Type 1 diabetes mellitus with other diabetic kidney complication: Secondary | ICD-10-CM

## 2013-09-26 DIAGNOSIS — Z23 Encounter for immunization: Secondary | ICD-10-CM

## 2013-09-26 LAB — HEMOGLOBIN A1C: Hgb A1c MFr Bld: 9.2 % — ABNORMAL HIGH (ref 4.6–6.5)

## 2013-09-26 LAB — BASIC METABOLIC PANEL
CO2: 28 mEq/L (ref 19–32)
Chloride: 100 mEq/L (ref 96–112)
Glucose, Bld: 300 mg/dL — ABNORMAL HIGH (ref 70–99)
Potassium: 3 mEq/L — ABNORMAL LOW (ref 3.5–5.1)
Sodium: 138 mEq/L (ref 135–145)

## 2013-09-26 NOTE — Progress Notes (Signed)
Subjective:    Patient ID: [name], male    DOB: 07/05/1941, 72 y.o.   MRN: [id number]  HPI Pt returns for f/u of insulin-requiring DM (dx'ed 1986, on a routine blood test; he has mild neuropathy of the lower extremities; and associated renal insufficiency; he has been on insulin since 1995; he has never had severe hypoglycemia or DKA).   pt states he feels well in general.  no cbg record, but states cbg's are highest in the afternoon, and at hs.   Past Medical History  Diagnosis Date  . Hepatic steatosis   . Urolithiasis   . Hypogonadism male   . Gynecomastia   . Anal fissure   . DM type 1 with diabetic peripheral neuropathy   . Personal history of colonic polyps   . Anemia, unspecified   . Duodenitis   . Esophageal stricture   . Arthritis     Spinal OA  . DIABETES MELLITUS, TYPE I 05/20/2007  . HYPERLIPIDEMIA 05/20/2007  . 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 Morbid obesity 09/10/2009  . OBSTRUCTIVE SLEEP APNEA 11/04/2007  . DIASTOLIC DYSFUNCTION [id number]  . RENAL INSUFFICIENCY 05/12/2008  . BACK PAIN, LUMBAR 10/23/2007  . CARDIAC MURMUR, SYSTOLIC [id number]  . Edema 05/12/2008    Past Surgical History  Procedure Laterality Date  . Abdominal US  09/1997  . Rest cardiolite  03/19/2003  . Lower arterial  04/13/2004  . Electrocardiogram  02/2006  . Colonoscopy  08/18/2004    diverticulitis  . Colonoscopy  09/24/2009  . Colon cancer screening    . Flexible sigmoidoscopy  03/04/2001    polyps, anal fissure    History   Social History  . Marital Status: Married    Spouse Name: N/A    Number of Children: N/A  . Years of Education: N/A   Occupational History  .     Social History Main Topics  . Smoking status: Former Smoker -- 2.00 packs/day for 31 years    Types: Cigarettes    Quit date: 10/23/1982  . Smokeless tobacco: Never Used  . Alcohol Use: No  . Drug Use: No  . Sexual Activity: Not on file   Other Topics Concern  . Not on file   Social History Narrative  . No narrative  on file    Current Outpatient Prescriptions on File Prior to Visit  Medication Sig Dispense Refill  . alfuzosin (UROXATRAL) 10 MG 24 hr tablet TAKE 1 TABLET DAILY  90 tablet  prn  . allopurinol (ZYLOPRIM) 300 MG tablet TAKE 1 TABLET DAILY  90 tablet  prn  . aspirin (BAYER LOW STRENGTH) 81 MG EC tablet Take 81 mg by mouth daily.        . celecoxib (CELEBREX) 200 MG capsule TAKE 1 CAPSULE DAILY  30 capsule  prn  . cycloSPORINE (RESTASIS) 0.05 % ophthalmic emulsion Place 1 drop into both eyes 2 (two) times daily.        . folic acid (FOLVITE) 1 MG tablet 4 tabs daily      . furosemide (LASIX) 20 MG tablet Take 1 tablet (20 mg total) by mouth daily.  90 tablet  prn  . glucose blood (ONE TOUCH ULTRA TEST) test strip Use as instructedUSE TWICE A DAY  200 each  3  . insulin lispro (HUMALOG) 100 UNIT/ML injection Inject subcutaneously three times a day (just before each meal) 50-50-60 units      . insulin NPH (HUMULIN N,NOVOLIN N) 100 UNIT/ML injection Inject 50 Units into the  skin at bedtime.       . Insulin Pen Needle 29G X 12.7MM MISC USE 4 (FOUR) TIMES A DAY  200 each  prn  . Lancets (ONETOUCH ULTRASOFT) lancets Use as directed dx 250.01  200 each  prn  . Multiple Vitamins-Minerals (CENTRUM SILVER PO) Take 1 tablet by mouth daily.        . Omega-3 Fatty Acids (FISH OIL) 1200 MG CAPS Take 1 capsule by mouth daily.        Marland Kitchen omeprazole (PRILOSEC) 20 MG capsule Take 1 capsule (20 mg total) by mouth daily. As needed  90 capsule  prn  . rosuvastatin (CRESTOR) 40 MG tablet TAKE 1 TABLET AT BEDTIME  90 tablet  prn  . traMADol (ULTRAM) 50 MG tablet Take 1 tablet by mouth every 4 (four) hours as needed for pain.  100 tablet  prn   No current facility-administered medications on file prior to visit.    Allergies  Allergen Reactions  . Atorvastatin   . Niacin     REACTION: Severe heartburn  . Pioglitazone     REACTION: Edema   Family History  Problem Relation Age of Onset  . Cancer Brother      colon cancer  . Cancer Brother     lung cancer   BP 122/52  Pulse 81  Temp(Src) 97.8 F (36.6 C) (Oral)  Ht 6' (1.829 m)  Wt 287 lb 9 oz (130.437 kg)  BMI 38.99 kg/m2  SpO2 95%  Review of Systems denies hypoglycemia.  He reports weight gain.    Objective:   Physical Exam VITAL SIGNS:  See vs page GENERAL: no distress  Lab Results  Component Value Date   HGBA1C 9.2* 09/26/2013      Assessment & Plan:  DM: he needs increased rx.  This insulin regimen was chosen from multiple options, as it best matches his insulin to his changing requirements throughout the day.  The benefits of glycemic control must be weighed against the risks of hypoglycemia.   Morbid obesity.  This limits rx of DM. Renal insufficiency: this increases the duration of action of insulin.

## 2013-09-26 NOTE — Patient Instructions (Signed)
check your blood sugar twice a day.  vary the time of day when you check, between before the 3 meals, and at bedtime.  also check if you have symptoms of your blood sugar being too high or too low.  please keep a record of the readings and bring it to your next appointment here.  please call us sooner if your blood sugar goes below 70, or if you have a lot of readings over 200.  blood tests are being requested for you today.  We'll contact you with results.   Please come back for a follow-up appointment in 3 months.

## 2013-10-01 ENCOUNTER — Other Ambulatory Visit: Payer: Self-pay | Admitting: Endocrinology

## 2013-10-02 ENCOUNTER — Other Ambulatory Visit: Payer: Self-pay

## 2013-10-02 MED ORDER — FUROSEMIDE 20 MG PO TABS: 20.0000 mg | ORAL_TABLET | Freq: Every day | ORAL | Status: DC

## 2013-10-02 NOTE — Telephone Encounter (Signed)
Lasix refilled and sent to [facility].

## 2013-10-31 ENCOUNTER — Other Ambulatory Visit: Payer: Self-pay | Admitting: Endocrinology

## 2013-11-06 ENCOUNTER — Encounter: Payer: Self-pay | Admitting: Endocrinology

## 2013-11-07 ENCOUNTER — Other Ambulatory Visit: Payer: Self-pay | Admitting: Endocrinology

## 2013-11-07 MED ORDER — CELECOXIB 200 MG PO CAPS: 200.0000 mg | ORAL_CAPSULE | Freq: Every day | ORAL | Status: DC

## 2013-11-19 ENCOUNTER — Other Ambulatory Visit: Payer: Self-pay | Admitting: *Deleted

## 2013-11-19 ENCOUNTER — Other Ambulatory Visit: Payer: Self-pay | Admitting: Endocrinology

## 2013-11-19 ENCOUNTER — Encounter: Payer: Self-pay | Admitting: Endocrinology

## 2013-11-21 ENCOUNTER — Other Ambulatory Visit: Payer: Self-pay | Admitting: Endocrinology

## 2013-11-24 MED ORDER — INSULIN LISPRO 100 UNIT/ML ~~LOC~~ SOLN: SUBCUTANEOUS | Status: DC

## 2013-11-24 MED ORDER — ROSUVASTATIN CALCIUM 40 MG PO TABS: 40.0000 mg | ORAL_TABLET | Freq: Every day | ORAL | Status: DC

## 2013-11-24 MED ORDER — ALFUZOSIN HCL ER 10 MG PO TB24: 10.0000 mg | ORAL_TABLET | Freq: Every day | ORAL | Status: DC

## 2013-11-24 MED ORDER — INSULIN NPH (HUMAN) (ISOPHANE) 100 UNIT/ML ~~LOC~~ SUSP: 58.0000 [IU] | Freq: Every day | SUBCUTANEOUS | Status: DC

## 2013-11-24 NOTE — Telephone Encounter (Signed)
Rx refill came though MyChart. Done.

## 2013-11-25 ENCOUNTER — Encounter: Payer: Self-pay | Admitting: Endocrinology

## 2013-12-26 ENCOUNTER — Ambulatory Visit (INDEPENDENT_AMBULATORY_CARE_PROVIDER_SITE_OTHER): Payer: Medicare Other | Admitting: Endocrinology

## 2013-12-26 ENCOUNTER — Encounter: Payer: Self-pay | Admitting: Endocrinology

## 2013-12-26 VITALS — BP 130/80 | HR 87 | Temp 98.0°F | Ht 72.0 in | Wt 295.0 lb

## 2013-12-26 DIAGNOSIS — R209 Unspecified disturbances of skin sensation: Secondary | ICD-10-CM | POA: Insufficient documentation

## 2013-12-26 DIAGNOSIS — E1029 Type 1 diabetes mellitus with other diabetic kidney complication: Secondary | ICD-10-CM

## 2013-12-26 NOTE — Progress Notes (Signed)
Subjective:    Patient ID: [name], male    DOB: Mar 04, 1941, 73 y.o.   MRN: [id number]  HPI Pt returns for f/u of insulin-requiring DM (dx'ed 1986, on a routine blood test; he has mild neuropathy of the lower extremities, and associated renal insufficiency; he has been on insulin since 1995; he has never had severe hypoglycemia or DKA; he takes multiple daily injections).   pt states he feels well in general. He wants to pursue weight-loss surgery.  he brings a record of his cbg's which i have reviewed today.  It varies from 62-300.  There is no trend throughout the day., except it is highest at hs.  Past Medical History  Diagnosis Date  . Hepatic steatosis   . Urolithiasis   . Hypogonadism male   . Gynecomastia   . Anal fissure   . DM type 1 with diabetic peripheral neuropathy   . Personal history of colonic polyps   . Anemia, unspecified   . Duodenitis   . Esophageal stricture   . Arthritis     Spinal OA  . DIABETES MELLITUS, TYPE I 05/20/2007  . HYPERLIPIDEMIA 05/20/2007  . 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 Morbid obesity 09/10/2009  . OBSTRUCTIVE SLEEP APNEA 11/04/2007  . DIASTOLIC DYSFUNCTION 99991111  . RENAL INSUFFICIENCY 05/12/2008  . BACK PAIN, LUMBAR 10/23/2007  . CARDIAC MURMUR, SYSTOLIC [id number]  . Edema 05/12/2008    Past Surgical History  Procedure Laterality Date  . Abdominal US  09/1997  . Rest cardiolite  03/19/2003  . Lower arterial  04/13/2004  . Electrocardiogram  02/2006  . Colonoscopy  08/18/2004    diverticulitis  . Colonoscopy  09/24/2009  . Colon cancer screening    . Flexible sigmoidoscopy  03/04/2001    polyps, anal fissure    History   Social History  . Marital Status: Married    Spouse Name: N/A    Number of Children: N/A  . Years of Education: N/A   Occupational History  .     Social History Main Topics  . Smoking status: Former Smoker -- 2.00 packs/day for 31 years    Types: Cigarettes    Quit date: 10/23/1982  . Smokeless tobacco: Never Used    . Alcohol Use: No  . Drug Use: No  . Sexual Activity: Not on file   Other Topics Concern  . Not on file   Social History Narrative  . No narrative on file    Current Outpatient Prescriptions on File Prior to Visit  Medication Sig Dispense Refill  . alfuzosin (UROXATRAL) 10 MG 24 hr tablet Take 1 tablet (10 mg total) by mouth daily with breakfast.  90 tablet  1  . allopurinol (ZYLOPRIM) 300 MG tablet TAKE 1 TABLET DAILY  90 tablet  prn  . aspirin (BAYER LOW STRENGTH) 81 MG EC tablet Take 81 mg by mouth daily.        . celecoxib (CELEBREX) 200 MG capsule Take 1 capsule (200 mg total) by mouth daily. TAKE 1 CAPSULE DAILY  90 capsule  prn  . cycloSPORINE (RESTASIS) 0.05 % ophthalmic emulsion Place 1 drop into both eyes 2 (two) times daily.        . folic acid (FOLVITE) 1 MG tablet 4 tabs daily      . furosemide (LASIX) 20 MG tablet Take 1 tablet (20 mg total) by mouth daily.  90 tablet  prn  . glucose blood (ONE TOUCH ULTRA TEST) test strip Use as instructedUSE TWICE A DAY  200 each  3  . Insulin Pen Needle 29G X 12.7MM MISC USE 4 (FOUR) TIMES A DAY  200 each  prn  . Lancets (ONETOUCH ULTRASOFT) lancets Use as directed dx 250.01  200 each  prn  . Multiple Vitamins-Minerals (CENTRUM SILVER PO) Take 1 tablet by mouth daily.        . Omega-3 Fatty Acids (FISH OIL) 1200 MG CAPS Take 1 capsule by mouth daily.        Marland Kitchen omeprazole (PRILOSEC) 20 MG capsule Take 1 capsule (20 mg total) by mouth daily. As needed  90 capsule  prn  . rosuvastatin (CRESTOR) 40 MG tablet Take 1 tablet (40 mg total) by mouth at bedtime.  90 tablet  1  . traMADol (ULTRAM) 50 MG tablet Take 1 tablet by mouth every 4 (four) hours as needed for pain.  100 tablet  prn   No current facility-administered medications on file prior to visit.   Allergies  Allergen Reactions  . Atorvastatin   . Niacin     REACTION: Severe heartburn  . Pioglitazone     REACTION: Edema   Family History  Problem Relation Age of Onset  .  Cancer Brother     colon cancer  . Cancer Brother     lung cancer   BP 130/80  Pulse 87  Temp(Src) 98 F (36.7 C) (Oral)  Ht 6' (1.829 m)  Wt 295 lb (133.811 kg)  BMI 40.00 kg/m2  SpO2 96%  Review of Systems Denies LOC and weight change    Objective:   Physical Exam VITAL SIGNS:  See vs page GENERAL: no distress     Assessment & Plan:  DM: he needs increased rx.  However, with these variable cbg's, he might do better with a pump. Morbid obesity.  This limits rx of DM. Renal insufficiency: this increases the duration of action of insulin.

## 2013-12-26 NOTE — Patient Instructions (Addendum)
Please come back for a regular physical appointment in 3 months. Please call [phone], to ask about weight-loss surgery. blood tests are being requested for you.  Please do fasting, the same day as you see [name].  We'll contact you with results. check your blood sugar twice a day.  vary the time of day when you check, between before the 3 meals, and at bedtime.  also check if you have symptoms of your blood sugar being too high or too low.  please keep a record of the readings and bring it to your next appointment here.  please call us sooner if your blood sugar goes below 70, or if you have a lot of readings over 200.  Please increase humalog to three times a day (just before each meal) 60-40-70 units.    Please see our pump trainer, to pursue the insulin pump.

## 2013-12-30 ENCOUNTER — Encounter: Payer: Self-pay | Admitting: Endocrinology

## 2013-12-30 ENCOUNTER — Encounter: Payer: Medicare Other | Attending: Endocrinology | Admitting: Nutrition

## 2013-12-30 ENCOUNTER — Other Ambulatory Visit: Payer: Self-pay

## 2013-12-30 ENCOUNTER — Other Ambulatory Visit: Payer: Medicare Other

## 2013-12-30 ENCOUNTER — Other Ambulatory Visit (INDEPENDENT_AMBULATORY_CARE_PROVIDER_SITE_OTHER): Payer: Medicare Other

## 2013-12-30 DIAGNOSIS — R209 Unspecified disturbances of skin sensation: Secondary | ICD-10-CM

## 2013-12-30 DIAGNOSIS — E1029 Type 1 diabetes mellitus with other diabetic kidney complication: Secondary | ICD-10-CM

## 2013-12-30 DIAGNOSIS — Z713 Dietary counseling and surveillance: Secondary | ICD-10-CM | POA: Insufficient documentation

## 2013-12-30 DIAGNOSIS — E109 Type 1 diabetes mellitus without complications: Secondary | ICD-10-CM | POA: Insufficient documentation

## 2013-12-30 LAB — BASIC METABOLIC PANEL
BUN: 22 mg/dL (ref 6–23)
CHLORIDE: 104 meq/L (ref 96–112)
CO2: 29 meq/L (ref 19–32)
CREATININE: 1.5 mg/dL (ref 0.4–1.5)
Calcium: 9.1 mg/dL (ref 8.4–10.5)
GFR: 49.25 mL/min — ABNORMAL LOW (ref >60.00)
Glucose, Bld: 237 mg/dL — ABNORMAL HIGH (ref 70–99)
POTASSIUM: 3.6 meq/L (ref 3.5–5.1)
Sodium: 141 mEq/L (ref 135–145)

## 2013-12-30 LAB — HEMOGLOBIN A1C: HEMOGLOBIN A1C: 8 % — AB (ref 4.6–6.5)

## 2013-12-30 LAB — VITAMIN B12: VITAMIN B 12: 383 pg/mL (ref 211–911)

## 2013-12-30 NOTE — Patient Instructions (Addendum)
Read over information given on insulin pump therapy, and decide which insulin pump you would like to have. Fill out information in back of brochure and fax to company to start order process. Test blood sugars before meals and at bedtime. See dietitian to learn to carb count.

## 2013-12-30 NOTE — Progress Notes (Signed)
This patient is here to discuss insulin pump.  He says that his blood sugars have been running in the 200s-300s for the last 3 weeks with no apparent reason.  Says he has never seen a pump and is not sure how it works.  He said that Dr. Loanne Drilling said that a pump might help him.  He is taking Novolog: 60/40/70, and NPH: 60 at HS.    We discussed how insulin pumps work, and he was show a couple models.  He was given brochures and he decided that he would like the one's whose cartridges hold 300u of insulin.  We discussed the differences between the Medtronic, and the Accu-chek pumps and he was encouraged to go on their web site for more information.    He does not know how to count carbohydrates and was told that he will need to see the dietitians upstairs to learn to do this.  He was given a brochure on this, and he agreed to see them.    He is only testing his blood sugars twice daily, and he was told that he will need to begin testing ac and HS--something that is required while on a pump.  He was given some sample test strips to help him to do this.    He had no final questions.  He was taken to the lab to have a C-peptide and FBS done this morning.

## 2013-12-31 LAB — C-PEPTIDE: C-Peptide: 2.42 ng/mL (ref 0.80–3.90)

## 2014-01-06 LAB — ANTI-ISLET CELL ANTIBODY: Pancreatic Islet Cell Antibody: <5 JDF Units (ref <5)

## 2014-01-07 ENCOUNTER — Encounter: Payer: Self-pay | Admitting: Endocrinology

## 2014-02-02 ENCOUNTER — Other Ambulatory Visit: Payer: Self-pay

## 2014-02-02 ENCOUNTER — Other Ambulatory Visit (INDEPENDENT_AMBULATORY_CARE_PROVIDER_SITE_OTHER): Payer: Medicare Other

## 2014-02-02 DIAGNOSIS — E1029 Type 1 diabetes mellitus with other diabetic kidney complication: Secondary | ICD-10-CM

## 2014-02-02 LAB — BASIC METABOLIC PANEL
BUN: 26 mg/dL — AB (ref 6–23)
CO2: 29 mEq/L (ref 19–32)
Calcium: 9.1 mg/dL (ref 8.4–10.5)
Chloride: 104 mEq/L (ref 96–112)
Creatinine, Ser: 1.4 mg/dL (ref 0.4–1.5)
GFR: 52.47 mL/min — ABNORMAL LOW (ref >60.00)
GLUCOSE: 161 mg/dL — AB (ref 70–99)
POTASSIUM: 3.6 meq/L (ref 3.5–5.1)
SODIUM: 142 meq/L (ref 135–145)

## 2014-02-02 LAB — HEMOGLOBIN A1C: HEMOGLOBIN A1C: 7.4 % — AB (ref 4.6–6.5)

## 2014-02-03 ENCOUNTER — Encounter: Payer: Self-pay | Admitting: Endocrinology

## 2014-02-04 ENCOUNTER — Other Ambulatory Visit: Payer: Self-pay | Admitting: Endocrinology

## 2014-02-04 MED ORDER — GLUCOSE BLOOD VI STRP: 1.0000 | ORAL_STRIP | Freq: Two times a day (BID) | Status: DC

## 2014-02-04 MED ORDER — ACCU-CHEK AVIVA PLUS W/DEVICE KIT: 1.0000 | PACK | Freq: Once | Status: DC

## 2014-02-09 ENCOUNTER — Other Ambulatory Visit: Payer: Self-pay | Admitting: Endocrinology

## 2014-02-10 ENCOUNTER — Other Ambulatory Visit: Payer: Self-pay

## 2014-02-10 DIAGNOSIS — E1029 Type 1 diabetes mellitus with other diabetic kidney complication: Secondary | ICD-10-CM

## 2014-02-11 ENCOUNTER — Other Ambulatory Visit: Payer: Self-pay

## 2014-02-11 ENCOUNTER — Other Ambulatory Visit (INDEPENDENT_AMBULATORY_CARE_PROVIDER_SITE_OTHER): Payer: Medicare Other

## 2014-02-11 DIAGNOSIS — N259 Disorder resulting from impaired renal tubular function, unspecified: Secondary | ICD-10-CM

## 2014-02-11 DIAGNOSIS — E1029 Type 1 diabetes mellitus with other diabetic kidney complication: Secondary | ICD-10-CM

## 2014-02-11 LAB — GLUCOSE, RANDOM: Glucose, Bld: 136 mg/dL — ABNORMAL HIGH (ref 70–99)

## 2014-02-12 ENCOUNTER — Encounter: Payer: Self-pay | Admitting: Endocrinology

## 2014-02-12 LAB — C-PEPTIDE: C-Peptide: 1.89 ng/mL (ref 0.80–3.90)

## 2014-03-11 ENCOUNTER — Other Ambulatory Visit: Payer: Self-pay | Admitting: Endocrinology

## 2014-03-11 DIAGNOSIS — E1029 Type 1 diabetes mellitus with other diabetic kidney complication: Secondary | ICD-10-CM

## 2014-03-23 ENCOUNTER — Other Ambulatory Visit: Payer: Self-pay | Admitting: Endocrinology

## 2014-03-23 ENCOUNTER — Encounter: Payer: Medicare Other | Attending: Endocrinology | Admitting: Nutrition

## 2014-03-23 DIAGNOSIS — E109 Type 1 diabetes mellitus without complications: Secondary | ICD-10-CM | POA: Insufficient documentation

## 2014-03-23 DIAGNOSIS — Z713 Dietary counseling and surveillance: Secondary | ICD-10-CM | POA: Insufficient documentation

## 2014-03-23 DIAGNOSIS — E1029 Type 1 diabetes mellitus with other diabetic kidney complication: Secondary | ICD-10-CM

## 2014-03-24 ENCOUNTER — Other Ambulatory Visit: Payer: Self-pay

## 2014-03-24 NOTE — Progress Notes (Signed)
[name] and his wife are here to start on the Accu-Chek insulin pump.  He reported having read some of the manual.  He had several questions about basal/bolus insulins.  After a discussion of this, he reported good understanding.   We reviewed how the pump works, and he loaded a cartridge and infusion set with little assistance from me.  He put in the basal rates, and bolus calculations and was show how to give a bolus using the meter remote. Basal rate:  2u/hr,  Bolus calculations:  I/C: 10, Sensitvity: 10, timing: 4hours.  He did 2 trials of this and did this correctly.  He put in a cartridge and infusion set with some assistance from me.   He tested his blood sugar using the [employer] meter remote and it was 242.  He gave himself a correction bolus of 7.9 units without any difficulty.    We discussed the need to test blood sugars before meals, and at bedtime.  He was given a log sheet to record his blood sugar readings.    We reviewed topics of alarms, temp basal rates--when and how to use them and stop them, high blood sugar protocol, and sick days, and he reported good understanding of this.  He had no final questions.  He signed off on understanding all topics and had no questions.  I will call him tonight to see how he is doing.  He was told to call the office if blood sugars stay high, or if he drops low before 5PM today.  He agreed to do this.

## 2014-03-25 ENCOUNTER — Other Ambulatory Visit: Payer: Self-pay

## 2014-03-25 ENCOUNTER — Encounter: Payer: Medicare Other | Admitting: Nutrition

## 2014-03-25 ENCOUNTER — Telehealth: Payer: Self-pay | Admitting: Endocrinology

## 2014-03-25 DIAGNOSIS — E1029 Type 1 diabetes mellitus with other diabetic kidney complication: Secondary | ICD-10-CM

## 2014-03-25 MED ORDER — INSULIN LISPRO 100 UNIT/ML ~~LOC~~ SOLN: SUBCUTANEOUS | Status: DC

## 2014-03-25 NOTE — Telephone Encounter (Signed)
Pt. Reported no difficulty giving self his boluses at lunch and supper.  He reported that his blood sugar before supper was 125, and it was 169 2 hours after supper.  He is very pleased with this readings.  Said he had no difficulty using the meter remote to put his carbs in, and meal times.  He had no questions for me

## 2014-03-25 NOTE — Progress Notes (Signed)
Pt. Did a cartridge and infusion set change with some assistance from me.  He reports no difficulty wear the pump, or using it.  Since it has only been 2 1/2 days, we did not make any changes to his settings.  He will see Dr. Loanne Drilling on Friday for this.   We reviewed carb counting, and he appears to be doing this correctly.  We also reviewed high blood sugar protocols and sick day guidelines.  He had no final questions.

## 2014-03-27 ENCOUNTER — Encounter: Payer: Self-pay | Admitting: Endocrinology

## 2014-03-27 ENCOUNTER — Ambulatory Visit (INDEPENDENT_AMBULATORY_CARE_PROVIDER_SITE_OTHER): Payer: Medicare Other | Admitting: Endocrinology

## 2014-03-27 VITALS — BP 128/64 | HR 76 | Temp 98.0°F | Ht 72.0 in | Wt 286.0 lb

## 2014-03-27 DIAGNOSIS — E1029 Type 1 diabetes mellitus with other diabetic kidney complication: Secondary | ICD-10-CM

## 2014-03-27 MED ORDER — ACCU-CHEK FASTCLIX LANCETS MISC: 1.0000 | Freq: Four times a day (QID) | Status: DC

## 2014-03-27 MED ORDER — TRANSPARENT I.V. SITE DRESSING MISC: 1.0000 | Status: DC

## 2014-03-27 NOTE — Progress Notes (Signed)
Subjective:    Patient ID: [name], male    DOB: 01/29/1941, 73 y.o.   MRN: [id number]  HPI Pt returns for f/u of insulin-requiring DM (dx'ed 1986, on a routine blood test; he has mild neuropathy of the lower extremities, and associated renal insufficiency; he has been on insulin since 1995; he has never had pancreatitis, severe hypoglycemia or DKA).   pt states he feels well in general. He wants to pursue weight-loss surgery.  He started pump therapy a few days ago, and will start continuous glucose monitor soon.   He takes: basal rate of 2 units/hr, except for units/hr, 3 am to 6 am mealtime bolus of 1 unit/10 grams carbohydrate correction bolus (which some people call "sensitivity," or "insulin sensitivity ratio," or just "isr") of 1 unit for each 10 by which your glucose exceeds 100. The plan is to start a continuous glucose monitor soon.  he brings a record of his cbg's which i have reviewed today, which will be scanned into epic.  It varies from 125-200's, but most are in the mid-100's.  It is in general higher as the day goes on.  His total insulin dosage is approx 80 units daily.   Past Medical History  Diagnosis Date  . Hepatic steatosis   . Urolithiasis   . Hypogonadism male   . Gynecomastia   . Anal fissure   . DM type 1 with diabetic peripheral neuropathy   . Personal history of colonic polyps   . Anemia, unspecified   . Duodenitis   . Esophageal stricture   . Arthritis     Spinal OA  . DIABETES MELLITUS, TYPE I 05/20/2007  . HYPERLIPIDEMIA 05/20/2007  . 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 Morbid obesity 09/10/2009  . OBSTRUCTIVE SLEEP APNEA 11/04/2007  . DIASTOLIC DYSFUNCTION 01/16/7123  . RENAL INSUFFICIENCY 05/12/2008  . BACK PAIN, LUMBAR 10/23/2007  . CARDIAC MURMUR, SYSTOLIC 02/27/997  . Edema 05/12/2008    Past Surgical History  Procedure Laterality Date  . Abdominal US  09/1997  . Rest cardiolite  03/19/2003  . Lower arterial  04/13/2004  . Electrocardiogram  02/2006  .  Colonoscopy  08/18/2004    diverticulitis  . Colonoscopy  09/24/2009  . Colon cancer screening    . Flexible sigmoidoscopy  03/04/2001    polyps, anal fissure    History   Social History  . Marital Status: Married    Spouse Name: N/A    Number of Children: N/A  . Years of Education: N/A   Occupational History  .     Social History Main Topics  . Smoking status: Former Smoker -- 2.00 packs/day for 31 years    Types: Cigarettes    Quit date: 10/23/1982  . Smokeless tobacco: Never Used  . Alcohol Use: No  . Drug Use: No  . Sexual Activity: Not on file   Other Topics Concern  . Not on file   Social History Narrative  . No narrative on file    Current Outpatient Prescriptions on File Prior to Visit  Medication Sig Dispense Refill  . alfuzosin (UROXATRAL) 10 MG 24 hr tablet Take 1 tablet (10 mg total) by mouth daily with breakfast.  90 tablet  1  . allopurinol (ZYLOPRIM) 300 MG tablet Take 1 tablet by mouth  daily  90 tablet  1  . aspirin (BAYER LOW STRENGTH) 81 MG EC tablet Take 81 mg by mouth daily.        . Blood Glucose Monitoring Suppl (ACCU-CHEK  AVIVA PLUS) W/DEVICE KIT 1 Device by Does not apply route once.  1 kit  0  . celecoxib (CELEBREX) 200 MG capsule Take 1 capsule (200 mg total) by mouth daily. TAKE 1 CAPSULE DAILY  90 capsule  prn  . cycloSPORINE (RESTASIS) 0.05 % ophthalmic emulsion Place 1 drop into both eyes 2 (two) times daily.        . folic acid (FOLVITE) 1 MG tablet 4 tabs daily      . furosemide (LASIX) 20 MG tablet Take 1 tablet (20 mg total) by mouth daily.  90 tablet  prn  . glucose blood (ACCU-CHEK AVIVA) test strip 1 each by Other route 2 (two) times daily. And lancets 2/day 250.01  100 each  12  . insulin lispro (HUMALOG) 100 UNIT/ML injection Use for pump 65 units per day.  6 vial  2  . insulin NPH Human (HUMULIN N,NOVOLIN N) 100 UNIT/ML injection Inject 60 Units into the skin at bedtime.      . Insulin Pen Needle 29G X 12.7MM MISC USE 4 (FOUR)  TIMES A DAY  200 each  prn  . Multiple Vitamins-Minerals (CENTRUM SILVER PO) Take 1 tablet by mouth daily.        . Omega-3 Fatty Acids (FISH OIL) 1200 MG CAPS Take 1 capsule by mouth daily.        Marland Kitchen omeprazole (PRILOSEC) 20 MG capsule Take 1 capsule by mouth  daily as needed  90 capsule  1  . rosuvastatin (CRESTOR) 40 MG tablet Take 1 tablet (40 mg total) by mouth at bedtime.  90 tablet  1  . traMADol (ULTRAM) 50 MG tablet Take 1 tablet by mouth every 4 (four) hours as needed for pain.  100 tablet  prn   No current facility-administered medications on file prior to visit.    Allergies  Allergen Reactions  . Atorvastatin   . Niacin     REACTION: Severe heartburn  . Pioglitazone     REACTION: Edema    Family History  Problem Relation Age of Onset  . Cancer Brother     colon cancer  . Cancer Brother     lung cancer    BP 128/64  Pulse 76  Temp(Src) 98 F (36.7 C) (Oral)  Ht 6' (1.829 m)  Wt 286 lb (129.729 kg)  BMI 38.78 kg/m2  SpO2 97%  Review of Systems He denies hypoglycemia and weight change    Objective:   Physical Exam Pulses: dorsalis pedis intact bilat.  Feet: no deformity. feet are of normal color and temp. Trace bilat leg edema  Skin: no ulcer on the feet.  Neuro: sensation is intact to touch on the feet, but decreased from normal.      Assessment & Plan:  DM: glycemic control is improved on pump rx.   Morbid obesity: this complicates the rx of DM.  This impairs the ability to achieve glycemic control.  I'll work around this as best I can.  He declines weight-loss surgery.   Patient is advised the following: Patient Instructions  Please: continue basal rate of 2 units/hr, except for units/hr, 3 am to 6 am.   increase the mealtime bolus to 1 unit/9 grams carbohydrate.  continue correction bolus (which some people call "sensitivity," or "insulin sensitivity ratio," or just "isr") of 1 unit for each by which your glucose exceeds 100. Please come back  for a follow-up appointment in 1 month.

## 2014-03-27 NOTE — Patient Instructions (Signed)
Please: continue basal rate of 2 units/hr, except for units/hr, 3 am to 6 am.   increase the mealtime bolus to 1 unit/9 grams carbohydrate.  continue correction bolus (which some people call "sensitivity," or "insulin sensitivity ratio," or just "isr") of 1 unit for each by which your glucose exceeds 100. Please come back for a follow-up appointment in 1 month.

## 2014-04-27 ENCOUNTER — Ambulatory Visit: Payer: Medicare Other | Admitting: Endocrinology

## 2014-05-06 ENCOUNTER — Telehealth: Payer: Self-pay | Admitting: Endocrinology

## 2014-05-06 ENCOUNTER — Ambulatory Visit (INDEPENDENT_AMBULATORY_CARE_PROVIDER_SITE_OTHER): Payer: Medicare Other | Admitting: Endocrinology

## 2014-05-06 ENCOUNTER — Encounter: Payer: Self-pay | Admitting: Endocrinology

## 2014-05-06 VITALS — BP 128/64 | HR 77 | Temp 98.4°F | Ht 72.0 in | Wt 283.0 lb

## 2014-05-06 DIAGNOSIS — E1029 Type 1 diabetes mellitus with other diabetic kidney complication: Secondary | ICD-10-CM

## 2014-05-06 MED ORDER — TRANSPARENT I.V. SITE DRESSING MISC: 1.0000 | Status: DC

## 2014-05-06 NOTE — Patient Instructions (Addendum)
Please: continue basal rate of 2 units/hr, 24 hrs per day.   increase the mealtime bolus to 1 unit/8 grams carbohydrate.  continue correction bolus (which some people call "sensitivity," or "insulin sensitivity ratio," or just "isr") of 1 unit for each by which your glucose exceeds 100. Please come back for a regular physical appointment in 2 months.   check your blood sugar 6 times a day.  vary the time of day when you check, between before the 3 meals, and at bedtime.  also check if you have symptoms of your blood sugar being too high or too low.  please keep a record of the readings and bring it to your next appointment here.  You can write it on any piece of paper.  please call us sooner if your blood sugar goes below 70, or if you have a lot of readings over 200.

## 2014-05-06 NOTE — Telephone Encounter (Signed)
Where is the pt's script for the smith and nephew 3000 dressing? He did not get ti when he left?

## 2014-05-06 NOTE — Progress Notes (Signed)
Subjective:    Patient ID: [name], male    DOB: January 02, 1941, 73 y.o.   MRN: [id number]  HPI Pt returns for f/u of insulin-requiring DM (dx'ed 1986, on a routine blood test; he has mild neuropathy of the lower extremities, and associated renal insufficiency; he has been on insulin since 1995; he has never had pancreatitis, severe hypoglycemia or DKA; He started pump therapy in mid-2015).  pt states he feels well in general. He wants to pursue weight-loss surgery.   He takes: basal rate of 2 units/hr, 24 hrs/day mealtime bolus of 1 unit/9 grams carbohydrate correction bolus (which some people call "sensitivity," or "insulin sensitivity ratio," or just "isr") of 1 unit for each 10 by which your glucose exceeds 100. He averages approx 70 units total, per day.  The plan is to start a continuous glucose monitor soon.  he brings a record of his cbg's which i have reviewed today, which will be scanned into epic.   Past Medical History  Diagnosis Date  . Hepatic steatosis   . Urolithiasis   . Hypogonadism male   . Gynecomastia   . Anal fissure   . DM type 1 with diabetic peripheral neuropathy   . Personal history of colonic polyps   . Anemia, unspecified   . Duodenitis   . Esophageal stricture   . Arthritis     Spinal OA  . DIABETES MELLITUS, TYPE I 05/20/2007  . HYPERLIPIDEMIA 05/20/2007  . 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 Morbid obesity 09/10/2009  . OBSTRUCTIVE SLEEP APNEA 11/04/2007  . DIASTOLIC DYSFUNCTION 01/29/8118  . RENAL INSUFFICIENCY 05/12/2008  . BACK PAIN, LUMBAR 10/23/2007  . CARDIAC MURMUR, SYSTOLIC 10/27/7827  . Edema 05/12/2008    Past Surgical History  Procedure Laterality Date  . Abdominal US  09/1997  . Rest cardiolite  03/19/2003  . Lower arterial  04/13/2004  . Electrocardiogram  02/2006  . Colonoscopy  08/18/2004    diverticulitis  . Colonoscopy  09/24/2009  . Colon cancer screening    . Flexible sigmoidoscopy  03/04/2001    polyps, anal fissure    History   Social  History  . Marital Status: Married    Spouse Name: N/A    Number of Children: N/A  . Years of Education: N/A   Occupational History  .     Social History Main Topics  . Smoking status: Former Smoker -- 2.00 packs/day for 31 years    Types: Cigarettes    Quit date: 10/23/1982  . Smokeless tobacco: Never Used  . Alcohol Use: No  . Drug Use: No  . Sexual Activity: Not on file   Other Topics Concern  . Not on file   Social History Narrative  . No narrative on file    Current Outpatient Prescriptions on File Prior to Visit  Medication Sig Dispense Refill  . ACCU-CHEK FASTCLIX LANCETS MISC 1 Device by Does not apply route 4 (four) times daily.  360 each  3  . alfuzosin (UROXATRAL) 10 MG 24 hr tablet Take 1 tablet (10 mg total) by mouth daily with breakfast.  90 tablet  1  . allopurinol (ZYLOPRIM) 300 MG tablet Take 1 tablet by mouth  daily  90 tablet  1  . aspirin (BAYER LOW STRENGTH) 81 MG EC tablet Take 81 mg by mouth daily.        . Blood Glucose Monitoring Suppl (ACCU-CHEK AVIVA PLUS) W/DEVICE KIT 1 Device by Does not apply route once.  1 kit  0  .  celecoxib (CELEBREX) 200 MG capsule Take 1 capsule (200 mg total) by mouth daily. TAKE 1 CAPSULE DAILY  90 capsule  prn  . cycloSPORINE (RESTASIS) 0.05 % ophthalmic emulsion Place 1 drop into both eyes 2 (two) times daily.        . folic acid (FOLVITE) 1 MG tablet 4 tabs daily      . furosemide (LASIX) 20 MG tablet Take 1 tablet (20 mg total) by mouth daily.  90 tablet  prn  . glucose blood (ACCU-CHEK AVIVA) test strip 1 each by Other route 2 (two) times daily. And lancets 2/day 250.01  100 each  12  . insulin lispro (HUMALOG) 100 UNIT/ML injection Use for pump 65 units per day.  6 vial  2  . Insulin Pen Needle 29G X 12.7MM MISC USE 4 (FOUR) TIMES A DAY  200 each  prn  . Multiple Vitamins-Minerals (CENTRUM SILVER PO) Take 1 tablet by mouth daily.        . Omega-3 Fatty Acids (FISH OIL) 1200 MG CAPS Take 1 capsule by mouth daily.         Marland Kitchen omeprazole (PRILOSEC) 20 MG capsule Take 1 capsule by mouth  daily as needed  90 capsule  1  . rosuvastatin (CRESTOR) 40 MG tablet Take 1 tablet (40 mg total) by mouth at bedtime.  90 tablet  1  . traMADol (ULTRAM) 50 MG tablet Take 1 tablet by mouth every 4 (four) hours as needed for pain.  100 tablet  prn  . insulin NPH Human (HUMULIN N,NOVOLIN N) 100 UNIT/ML injection Inject 60 Units into the skin at bedtime.       No current facility-administered medications on file prior to visit.    Allergies  Allergen Reactions  . Atorvastatin   . Niacin     REACTION: Severe heartburn  . Pioglitazone     REACTION: Edema    Family History  Problem Relation Age of Onset  . Cancer Brother     colon cancer  . Cancer Brother     lung cancer    BP 128/64  Pulse 77  Temp(Src) 98.4 F (36.9 C) (Oral)  Ht 6' (1.829 m)  Wt 283 lb (128.368 kg)  BMI 38.37 kg/m2  SpO2 97%   Review of Systems He denies hypoglycemia.  He has lost a few lbs.      Objective:   Physical Exam VITAL SIGNS:  See vs page GENERAL: no distress SKIN:  Insulin infusion sites at the anterior abdomen are normal, except for slight erythema at adhesive sites.   Lab Results  Component Value Date   HGBA1C 7.4* 02/02/2014       Assessment & Plan:  DM: mild exacerbation.     Patient is advised the following: Patient Instructions  Please: continue basal rate of 2 units/hr, 24 hrs per day.   increase the mealtime bolus to 1 unit/8 grams carbohydrate.  continue correction bolus (which some people call "sensitivity," or "insulin sensitivity ratio," or just "isr") of 1 unit for each by which your glucose exceeds 100. Please come back for a regular physical appointment in 2 months.   check your blood sugar 6 times a day.  vary the time of day when you check, between before the 3 meals, and at bedtime.  also check if you have symptoms of your blood sugar being too high or too low.  please keep a record of the  readings and bring it to your next appointment here.  You  can write it on any piece of paper.  please call us sooner if your blood sugar goes below 70, or if you have a lot of readings over 200.

## 2014-05-06 NOTE — Telephone Encounter (Signed)
See below and please advise, Thanks!  

## 2014-05-06 NOTE — Telephone Encounter (Signed)
i printed another rx

## 2014-05-06 NOTE — Telephone Encounter (Signed)
Pt informed that rx is ready for pick up. He states that he was able to find his supplies at [facility] that he does not need it at this moment.

## 2014-06-12 ENCOUNTER — Encounter: Payer: Self-pay | Admitting: Gastroenterology

## 2014-06-18 ENCOUNTER — Encounter: Payer: Self-pay | Admitting: Endocrinology

## 2014-06-23 ENCOUNTER — Encounter: Payer: Self-pay | Admitting: Pulmonary Disease

## 2014-06-23 ENCOUNTER — Ambulatory Visit (INDEPENDENT_AMBULATORY_CARE_PROVIDER_SITE_OTHER): Payer: Medicare Other | Admitting: Pulmonary Disease

## 2014-06-23 VITALS — BP 130/60 | HR 75 | Temp 98.5°F | Ht 72.0 in | Wt 279.6 lb

## 2014-06-23 DIAGNOSIS — G4733 Obstructive sleep apnea (adult) (pediatric): Secondary | ICD-10-CM

## 2014-06-23 NOTE — Progress Notes (Signed)
   Subjective:    Patient ID: [name], male    DOB: 1941-07-05, 73 y.o.   MRN: [id number]  HPI The patient comes in today for followup of his obstructive sleep apnea. He is wearing CPAP compliantly, and is having no issues with his pressure. He is having some issues with mask leaking, and is having to pull the mask tighter. He feels that he is sleeping well with the device, and is satisfied with his daytime alertness. He has lost 11 pounds since the last visit here.   Review of Systems  Constitutional: Negative for fever and unexpected weight change.  HENT: Negative for congestion, dental problem, ear pain, nosebleeds, postnasal drip, rhinorrhea, sinus pressure, sneezing, sore throat and trouble swallowing.   Eyes: Negative for redness and itching.  Respiratory: Negative for cough, chest tightness, shortness of breath and wheezing.   Cardiovascular: Negative for palpitations and leg swelling.  Gastrointestinal: Negative for nausea and vomiting.  Genitourinary: Negative for dysuria.  Musculoskeletal: Negative for joint swelling.  Skin: Negative for rash.  Neurological: Negative for headaches.  Hematological: Does not bruise/bleed easily.  Psychiatric/Behavioral: Negative for dysphoric mood. The patient is not nervous/anxious.        Objective:   Physical Exam Obese male in no acute distress Nose without purulence or discharge noted Neck without lymphadenopathy or thyromegaly No skin breakdown or pressure necrosis from the CPAP mask Lower extremities without edema, no cyanosis Alert and oriented, does not appear to be sleepy, moves all 4 extremities.       Assessment & Plan:

## 2014-06-23 NOTE — Patient Instructions (Signed)
Continue with cpap, and think about trying a different mask Keep working on weight loss Followup with me again in one year if doing well.

## 2014-06-23 NOTE — Assessment & Plan Note (Signed)
The patient is doing very well on CPAP, and is satisfied with his sleep and daytime alertness. He is having issues with his mask fit, and will contact his home care company to try a different type. I've also offered to send him to the sleep Center for a formal mask fitting, and he will call if he decides to do this.

## 2014-06-24 ENCOUNTER — Encounter: Payer: Self-pay | Admitting: Pulmonary Disease

## 2014-06-24 DIAGNOSIS — G4733 Obstructive sleep apnea (adult) (pediatric): Secondary | ICD-10-CM

## 2014-06-25 NOTE — Telephone Encounter (Signed)
Ok to seen pt to sleep center for mask fitting during day sle1006

## 2014-06-25 NOTE — Telephone Encounter (Signed)
Per OV; Patient Instructions     Continue with cpap, and think about trying a different mask  Keep working on weight loss  Followup with me again in one year if doing well.    Pt is requesting order to be sent for mask fit at the sleep center. Please advise Dr. Gwenette Greet thanks

## 2014-07-01 ENCOUNTER — Ambulatory Visit (HOSPITAL_BASED_OUTPATIENT_CLINIC_OR_DEPARTMENT_OTHER): Payer: Medicare Other | Attending: Pulmonary Disease | Admitting: Radiology

## 2014-07-01 DIAGNOSIS — Z9989 Dependence on other enabling machines and devices: Principal | ICD-10-CM

## 2014-07-01 DIAGNOSIS — G4733 Obstructive sleep apnea (adult) (pediatric): Secondary | ICD-10-CM

## 2014-07-07 ENCOUNTER — Ambulatory Visit (INDEPENDENT_AMBULATORY_CARE_PROVIDER_SITE_OTHER): Payer: Medicare Other | Admitting: Endocrinology

## 2014-07-07 ENCOUNTER — Encounter: Payer: Self-pay | Admitting: Endocrinology

## 2014-07-07 VITALS — BP 132/70 | HR 69 | Temp 98.0°F | Ht 72.0 in | Wt 276.0 lb

## 2014-07-07 DIAGNOSIS — I10 Essential (primary) hypertension: Secondary | ICD-10-CM

## 2014-07-07 DIAGNOSIS — E785 Hyperlipidemia, unspecified: Secondary | ICD-10-CM

## 2014-07-07 DIAGNOSIS — Z125 Encounter for screening for malignant neoplasm of prostate: Secondary | ICD-10-CM

## 2014-07-07 DIAGNOSIS — M109 Gout, unspecified: Secondary | ICD-10-CM

## 2014-07-07 DIAGNOSIS — Z2911 Encounter for prophylactic immunotherapy for respiratory syncytial virus (RSV): Secondary | ICD-10-CM

## 2014-07-07 DIAGNOSIS — N259 Disorder resulting from impaired renal tubular function, unspecified: Secondary | ICD-10-CM

## 2014-07-07 DIAGNOSIS — Z23 Encounter for immunization: Secondary | ICD-10-CM

## 2014-07-07 DIAGNOSIS — E1029 Type 1 diabetes mellitus with other diabetic kidney complication: Secondary | ICD-10-CM

## 2014-07-07 DIAGNOSIS — Z79899 Other long term (current) drug therapy: Secondary | ICD-10-CM

## 2014-07-07 DIAGNOSIS — Z0000 Encounter for general adult medical examination without abnormal findings: Secondary | ICD-10-CM | POA: Insufficient documentation

## 2014-07-07 LAB — BASIC METABOLIC PANEL
BUN: 29 mg/dL — AB (ref 6–23)
CO2: 30 meq/L (ref 19–32)
CREATININE: 1.6 mg/dL — AB (ref 0.4–1.5)
Calcium: 9.6 mg/dL (ref 8.4–10.5)
Chloride: 103 mEq/L (ref 96–112)
GFR: 44.02 mL/min — ABNORMAL LOW (ref >60.00)
GLUCOSE: 106 mg/dL — AB (ref 70–99)
Potassium: 3.9 mEq/L (ref 3.5–5.1)
Sodium: 142 mEq/L (ref 135–145)

## 2014-07-07 LAB — HEPATIC FUNCTION PANEL
ALBUMIN: 4 g/dL (ref 3.5–5.2)
ALT: 29 U/L (ref 0–53)
AST: 24 U/L (ref 0–37)
Alkaline Phosphatase: 69 U/L (ref 39–117)
Bilirubin, Direct: 0.1 mg/dL (ref 0.0–0.3)
Total Bilirubin: 0.7 mg/dL (ref 0.2–1.2)
Total Protein: 6.7 g/dL (ref 6.0–8.3)

## 2014-07-07 LAB — URINALYSIS, ROUTINE W REFLEX MICROSCOPIC
Bilirubin Urine: NEGATIVE
Hgb urine dipstick: NEGATIVE
KETONES UR: NEGATIVE
NITRITE: NEGATIVE
PH: 6 (ref 5.0–8.0)
SPECIFIC GRAVITY, URINE: 1.01 (ref 1.000–1.030)
Total Protein, Urine: NEGATIVE
UROBILINOGEN UA: 0.2 (ref 0.0–1.0)
Urine Glucose: NEGATIVE

## 2014-07-07 LAB — LIPID PANEL
CHOL/HDL RATIO: 4
Cholesterol: 109 mg/dL (ref 0–200)
HDL: 29.1 mg/dL — AB (ref >39.00)
LDL Cholesterol: 54 mg/dL (ref 0–99)
NONHDL: 79.9
Triglycerides: 129 mg/dL (ref 0.0–149.0)
VLDL: 25.8 mg/dL (ref 0.0–40.0)

## 2014-07-07 LAB — HEMOGLOBIN A1C: Hgb A1c MFr Bld: 6.6 % — ABNORMAL HIGH (ref 4.6–6.5)

## 2014-07-07 LAB — PSA: PSA: 2.27 ng/mL (ref 0.10–4.00)

## 2014-07-07 LAB — TSH: TSH: 4.64 u[IU]/mL — ABNORMAL HIGH (ref 0.35–4.50)

## 2014-07-07 NOTE — Patient Instructions (Addendum)
Please decrease basal rate to 1.8 units/hr, 24 hrs per day.   continue the mealtime bolus of 1 unit/8 grams carbohydrate.  continue correction bolus (which some people call "sensitivity," or "insulin sensitivity ratio," or just "isr") of 1 unit for each by which your glucose exceeds 100. Please come back for a follow-up appointment in 2 months.  check your blood sugar 6 times a day.  vary the time of day when you check, between before the 3 meals, and at bedtime.  also check if you have symptoms of your blood sugar being too high or too low.  please keep a record of the readings and bring it to your next appointment here.  You can write it on any piece of paper.  please call us sooner if your blood sugar goes below 70, or if you have a lot of readings over 200. blood tests are being requested for you today.  We'll contact you with results. please consider these measures for your health:  minimize alcohol.  do not use tobacco products.  have a colonoscopy at least every 10 years from age 31.  keep firearms safely stored.  always use seat belts.  have working smoke alarms in your home.  see an eye doctor and dentist regularly.  never drive under the influence of alcohol or drugs (including prescription drugs).  those with fair skin should take precautions against the sun. it is critically important to prevent falling down (keep floor areas well-lit, dry, and free of loose objects.  If you have a cane, walker, or wheelchair, you should use it, even for short trips around the house.  Also, try not to rush).

## 2014-07-07 NOTE — Progress Notes (Signed)
Subjective:    Patient ID: [name], male    DOB: 1941/09/28, 73 y.o.   MRN: [id number]  HPI Pt is here for regular wellness examination, and is feeling pretty well in general, and says chronic med probs are stable, except as noted below. Past Medical History  Diagnosis Date  . Hepatic steatosis   . Urolithiasis   . Hypogonadism male   . Gynecomastia   . Anal fissure   . DM type 1 with diabetic peripheral neuropathy   . Personal history of colonic polyps   . Anemia, unspecified   . Duodenitis   . Esophageal stricture   . Arthritis     Spinal OA  . DIABETES MELLITUS, TYPE I 05/20/2007  . HYPERLIPIDEMIA 05/20/2007  . 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 Morbid obesity 09/10/2009  . OBSTRUCTIVE SLEEP APNEA 11/04/2007  . DIASTOLIC DYSFUNCTION 2/83/1517  . RENAL INSUFFICIENCY 05/12/2008  . BACK PAIN, LUMBAR 10/23/2007  . CARDIAC MURMUR, SYSTOLIC 03/23/6072  . Edema 05/12/2008    Past Surgical History  Procedure Laterality Date  . Abdominal US  09/1997  . Rest cardiolite  03/19/2003  . Lower arterial  04/13/2004  . Electrocardiogram  02/2006  . Colonoscopy  08/18/2004    diverticulitis  . Colonoscopy  09/24/2009  . Colon cancer screening    . Flexible sigmoidoscopy  03/04/2001    polyps, anal fissure    History   Social History  . Marital Status: Married    Spouse Name: N/A    Number of Children: N/A  . Years of Education: N/A   Occupational History  .     Social History Main Topics  . Smoking status: Former Smoker -- 2.00 packs/day for 31 years    Types: Cigarettes    Quit date: 10/23/1982  . Smokeless tobacco: Never Used  . Alcohol Use: No  . Drug Use: No  . Sexual Activity: Not on file   Other Topics Concern  . Not on file   Social History Narrative  . No narrative on file    Current Outpatient Prescriptions on File Prior to Visit  Medication Sig Dispense Refill  . ACCU-CHEK FASTCLIX LANCETS MISC 1 Device by Does not apply route 4 (four) times daily.  360 each  3  .  alfuzosin (UROXATRAL) 10 MG 24 hr tablet Take 1 tablet (10 mg total) by mouth daily with breakfast.  90 tablet  1  . allopurinol (ZYLOPRIM) 300 MG tablet Take 1 tablet by mouth  daily  90 tablet  1  . aspirin (BAYER LOW STRENGTH) 81 MG EC tablet Take 81 mg by mouth daily.        . Blood Glucose Monitoring Suppl (ACCU-CHEK AVIVA PLUS) W/DEVICE KIT 1 Device by Does not apply route once.  1 kit  0  . celecoxib (CELEBREX) 200 MG capsule Take 1 capsule (200 mg total) by mouth daily. TAKE 1 CAPSULE DAILY  90 capsule  prn  . cycloSPORINE (RESTASIS) 0.05 % ophthalmic emulsion Place 1 drop into both eyes 2 (two) times daily.        . folic acid (FOLVITE) 1 MG tablet 4 tabs daily      . furosemide (LASIX) 20 MG tablet Take 1 tablet (20 mg total) by mouth daily.  90 tablet  prn  . glucose blood (ACCU-CHEK AVIVA) test strip 1 each by Other route 2 (two) times daily. And lancets 2/day 250.01  100 each  12  . insulin lispro (HUMALOG) 100 UNIT/ML injection Use for pump  65 units per day.  6 vial  2  . insulin NPH Human (HUMULIN N,NOVOLIN N) 100 UNIT/ML injection Inject 60 Units into the skin at bedtime.      . Multiple Vitamins-Minerals (CENTRUM SILVER PO) Take 1 tablet by mouth daily.        . Omega-3 Fatty Acids (FISH OIL) 1200 MG CAPS Take 1 capsule by mouth daily.        Marland Kitchen omeprazole (PRILOSEC) 20 MG capsule Take 1 capsule by mouth  daily as needed  90 capsule  1  . rosuvastatin (CRESTOR) 40 MG tablet Take 1 tablet (40 mg total) by mouth at bedtime.  90 tablet  1  . traMADol (ULTRAM) 50 MG tablet Take 1 tablet by mouth every 4 (four) hours as needed for pain.  100 tablet  prn  . Transparent Dressings (TRANSPARENT I.V. SITE DRESSING) MISC 1 Device by Does not apply route every 3 (three) days. Tamala Julian and nephew 2 3/8 x 2 3/4 opsite iv 3000 frame delivery  30 each  3   No current facility-administered medications on file prior to visit.    Allergies  Allergen Reactions  . Atorvastatin   . Niacin      REACTION: Severe heartburn  . Pioglitazone     REACTION: Edema    Family History  Problem Relation Age of Onset  . Cancer Brother     colon cancer  . Cancer Brother     lung cancer    BP 132/70  Pulse 69  Temp(Src) 98 F (36.7 C) (Oral)  Ht 6' (1.829 m)  Wt 276 lb (125.193 kg)  BMI 37.42 kg/m2  SpO2 96%     Review of Systems  Constitutional: Negative for fever.  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  Respiratory: Negative for shortness of breath.   Cardiovascular: Negative for chest pain.  Gastrointestinal: Negative for anal bleeding.  Endocrine: Positive for cold intolerance.  Genitourinary: Negative for hematuria.  Musculoskeletal:       No change in chronic low-back pain  Skin: Negative for rash.  Allergic/Immunologic: Positive for environmental allergies.  Neurological: Negative for syncope.  Hematological: Bruises/bleeds easily.  Psychiatric/Behavioral: Negative for dysphoric mood.       Objective:   Physical Exam VS: see vs page GEN: no distress HEAD: head: no deformity eyes: no periorbital swelling, no proptosis external nose and ears are normal mouth: no lesion seen NECK: supple, thyroid is not enlarged CHEST WALL: no deformity LUNGS: clear to auscultation BREASTS:  No gynecomastia. CV: reg rate and rhythm, no murmur ABD: abdomen is soft, nontender.  no hepatosplenomegaly.  not distended.  no hernia GENITALIA/RECTAL/PROSTATE: sees urology MUSCULOSKELETAL: muscle bulk and strength are grossly normal.  no obvious joint swelling.  gait is normal and steady EXTEMITIES: no deformity.  no ulcer on the feet.  feet are of normal color and temp.  Trace bilat leg edema PULSES: dorsalis pedis intact bilat.  no carotid bruit.  NEURO:  cn 2-12 grossly intact.   readily moves all 4's.  sensation is intact to touch on the feet, but decreased from normal.   SKIN:  Normal texture and temperature.  No rash or  suspicious lesion is visible.   NODES:  None palpable at the neck PSYCH: alert, well-oriented.  Does not appear anxious nor depressed.        Assessment & Plan:  Wellness visit today, with problems stable, except as noted.  we discussed code status.  pt requests full code, but would not want to be started or maintained on artificial life-support measures if there was not a reasonable chance of recovery.    SEPARATE EVALUATION FOLLOWS--EACH PROBLEM HERE IS NEW, NOT RESPONDING TO TREATMENT, OR POSES SIGNIFICANT RISK TO THE PATIENT'S HEALTH: HISTORY OF THE PRESENT ILLNESS: Pt returns for f/u of insulin-requiring DM (dx'ed 1986, on a routine blood test; he has mild neuropathy of the lower extremities, and associated renal insufficiency; he has been on insulin since 1995; he has never had pancreatitis, severe hypoglycemia or DKA; He started pump therapy in mid-2015).  pt states he feels well in general. He wants to pursue weight-loss surgery.   He takes: basal rate of 2 units/hr, 24 hrs/day mealtime bolus of 1 unit/8 grams carbohydrate correction bolus (which some people call "sensitivity," or "insulin sensitivity ratio," or just "isr") of 1 unit for each 10 by which your glucose exceeds 100. He averages approx 75 units total, per day.  The plan is to start a continuous glucose monitor soon.  he brings a record of his cbg's which i have reviewed today, which will be scanned into epic.   PAST MEDICAL HISTORY reviewed and up to date today REVIEW OF SYSTEMS: He has lost weight, due to his efforts PHYSICAL EXAMINATION: VITAL SIGNS:  See vs page GENERAL: no distress SKIN:  Insulin infusion sites at the anterior abdomen are normal. LAB/XRAY RESULTS: i reviewed electrocardiogram IMPRESSION: DM: slightly overcontrolled PLAN:  decrease basal rate to 1.8 units/hr, 24 hrs per day.

## 2014-07-08 LAB — CBC WITH DIFFERENTIAL/PLATELET
BASOS ABS: 0 10*3/uL (ref 0.0–0.1)
Basophils Relative: 0.5 % (ref 0.0–3.0)
EOS ABS: 0.1 10*3/uL (ref 0.0–0.7)
Eosinophils Relative: 1.5 % (ref 0.0–5.0)
HCT: 46.9 % (ref 39.0–52.0)
Hemoglobin: 15.9 g/dL (ref 13.0–17.0)
LYMPHS PCT: 33.1 % (ref 12.0–46.0)
Lymphs Abs: 1.6 10*3/uL (ref 0.7–4.0)
MCHC: 34 g/dL (ref 30.0–36.0)
MCV: 91.7 fl (ref 78.0–100.0)
Monocytes Absolute: 0.2 10*3/uL (ref 0.1–1.0)
Monocytes Relative: 5.1 % (ref 3.0–12.0)
NEUTROS ABS: 2.8 10*3/uL (ref 1.4–7.7)
Neutrophils Relative %: 59.8 % (ref 43.0–77.0)
PLATELETS: 105 10*3/uL — AB (ref 150.0–400.0)
RBC: 5.12 Mil/uL (ref 4.22–5.81)
RDW: 15.3 % (ref 11.5–15.5)
WBC: 4.7 10*3/uL (ref 4.0–10.5)

## 2014-07-08 LAB — MICROALBUMIN / CREATININE URINE RATIO
Creatinine,U: 69.1 mg/dL
Microalb Creat Ratio: 1.2 mg/g (ref 0.0–30.0)
Microalb, Ur: 0.8 mg/dL (ref 0.0–1.9)

## 2014-07-24 ENCOUNTER — Encounter: Payer: Self-pay | Admitting: Gastroenterology

## 2014-07-29 ENCOUNTER — Other Ambulatory Visit: Payer: Self-pay | Admitting: Endocrinology

## 2014-07-30 ENCOUNTER — Other Ambulatory Visit: Payer: Self-pay | Admitting: Endocrinology

## 2014-07-30 ENCOUNTER — Encounter: Payer: Self-pay | Admitting: Endocrinology

## 2014-07-30 MED ORDER — INSULIN LISPRO 100 UNIT/ML ~~LOC~~ SOLN
SUBCUTANEOUS | Status: DC
Start: 2014-07-30 — End: 2015-10-16

## 2014-08-09 ENCOUNTER — Other Ambulatory Visit: Payer: Self-pay | Admitting: Endocrinology

## 2014-08-10 NOTE — Telephone Encounter (Signed)
Please advise if ok to refill. 

## 2014-09-07 ENCOUNTER — Ambulatory Visit (INDEPENDENT_AMBULATORY_CARE_PROVIDER_SITE_OTHER): Payer: Medicare Other | Admitting: Endocrinology

## 2014-09-07 ENCOUNTER — Encounter: Payer: Self-pay | Admitting: Endocrinology

## 2014-09-07 VITALS — BP 144/85 | HR 85 | Temp 98.2°F | Ht 72.0 in | Wt 270.0 lb

## 2014-09-07 DIAGNOSIS — R202 Paresthesia of skin: Secondary | ICD-10-CM | POA: Insufficient documentation

## 2014-09-07 DIAGNOSIS — E1022 Type 1 diabetes mellitus with diabetic chronic kidney disease: Secondary | ICD-10-CM

## 2014-09-07 DIAGNOSIS — D61818 Other pancytopenia: Secondary | ICD-10-CM

## 2014-09-07 DIAGNOSIS — N259 Disorder resulting from impaired renal tubular function, unspecified: Secondary | ICD-10-CM

## 2014-09-07 DIAGNOSIS — N189 Chronic kidney disease, unspecified: Secondary | ICD-10-CM

## 2014-09-07 LAB — CBC WITH DIFFERENTIAL/PLATELET
Basophils Absolute: 0 10*3/uL (ref 0.0–0.1)
Basophils Relative: 0.2 % (ref 0.0–3.0)
EOS PCT: 1.2 % (ref 0.0–5.0)
Eosinophils Absolute: 0.1 10*3/uL (ref 0.0–0.7)
HCT: 46.2 % (ref 39.0–52.0)
Hemoglobin: 15.1 g/dL (ref 13.0–17.0)
Lymphocytes Relative: 30.2 % (ref 12.0–46.0)
Lymphs Abs: 1.8 10*3/uL (ref 0.7–4.0)
MCHC: 32.8 g/dL (ref 30.0–36.0)
MCV: 92.2 fl (ref 78.0–100.0)
MONOS PCT: 7.1 % (ref 3.0–12.0)
Monocytes Absolute: 0.4 10*3/uL (ref 0.1–1.0)
NEUTROS PCT: 61.3 % (ref 43.0–77.0)
Neutro Abs: 3.6 10*3/uL (ref 1.4–7.7)
PLATELETS: 117 10*3/uL — AB (ref 150.0–400.0)
RBC: 5.01 Mil/uL (ref 4.22–5.81)
RDW: 14.7 % (ref 11.5–15.5)
WBC: 5.9 10*3/uL (ref 4.0–10.5)

## 2014-09-07 LAB — BASIC METABOLIC PANEL
BUN: 29 mg/dL — AB (ref 6–23)
CHLORIDE: 108 meq/L (ref 96–112)
CO2: 21 meq/L (ref 19–32)
Calcium: 9.5 mg/dL (ref 8.4–10.5)
Creatinine, Ser: 1.5 mg/dL (ref 0.4–1.5)
GFR: 48.4 mL/min — ABNORMAL LOW (ref >60.00)
GLUCOSE: 94 mg/dL (ref 70–99)
POTASSIUM: 3.9 meq/L (ref 3.5–5.1)
SODIUM: 144 meq/L (ref 135–145)

## 2014-09-07 LAB — TSH: TSH: 3.96 u[IU]/mL (ref 0.35–4.50)

## 2014-09-07 LAB — HEMOGLOBIN A1C: Hgb A1c MFr Bld: 6.3 % (ref 4.6–6.5)

## 2014-09-07 MED ORDER — CARVEDILOL 3.125 MG PO TABS: 3.1250 mg | ORAL_TABLET | Freq: Two times a day (BID) | ORAL | Status: DC

## 2014-09-07 NOTE — Patient Instructions (Addendum)
Please continue basal rate to 1.8 units/hr, 24 hrs per day.   continue the mealtime bolus of 1 unit/8 grams carbohydrate.  continue correction bolus (which some people call "sensitivity," or "insulin sensitivity ratio," or just "isr") of 1 unit for each 10 by which your glucose exceeds 100. Please come back for a follow-up appointment in 3 months.  check your blood sugar 6 times a day.  vary the time of day when you check, between before the 3 meals, and at bedtime.  also check if you have symptoms of your blood sugar being too high or too low.  please keep a record of the readings and bring it to your next appointment here.  You can write it on any piece of paper.  please call us sooner if your blood sugar goes below 70, or if you have a lot of readings over 200. blood tests are being requested for you today.  We'll contact you with results.  i have sent a prescription to your pharmacy, for your blood pressure.   Please see a neurology specialist.  you will receive a phone call, about a day and time for an appointment.

## 2014-09-07 NOTE — Progress Notes (Signed)
Subjective:    Patient ID: [name], male    DOB: 05/18/1941, 73 y.o.   MRN: [id number]  HPI  Pt returns for f/u of diabetes mellitus: DM type: Insulin-requiring type 2 Dx'ed: 4098 Complications: polyneuropathy and renal insufficiency. Therapy: insulin since 1995 DKA: never Severe hypoglycemia: never. Pancreatitis: never Other:  he started pump therapy in mid-2015 Interval history: He wants to pursue weight-loss surgery.   He takes these pump settings: basal rate of 1.8 units/hr, 24 hrs/day mealtime bolus of 1 unit/8 grams carbohydrate correction bolus (which some people call "sensitivity," or "insulin sensitivity ratio," or just "isr") of 1 unit for each 10 by which your glucose exceeds 100. He averages approx 70 units total, per day.  The plan is to start a continuous glucose monitor soon.  he brings a record of his cbg's which i have reviewed today, which will be scanned into epic. He says it is in general higher as the day goes on.  Due to weight loss, he has set aside weight loss surgery for now.   Pt states few years of moderate tingling of both legs, but no assoc pain.    Past Medical History  Diagnosis Date  . Hepatic steatosis   . Urolithiasis   . Hypogonadism male   . Gynecomastia   . Anal fissure   . DM type 1 with diabetic peripheral neuropathy   . Personal history of colonic polyps   . Anemia, unspecified   . Duodenitis   . Esophageal stricture   . Arthritis     Spinal OA  . DIABETES MELLITUS, TYPE I 05/20/2007  . HYPERLIPIDEMIA 05/20/2007  . 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 Morbid obesity 09/10/2009  . OBSTRUCTIVE SLEEP APNEA 11/04/2007  . DIASTOLIC DYSFUNCTION 11/10/1476  . RENAL INSUFFICIENCY 05/12/2008  . BACK PAIN, LUMBAR 10/23/2007  . CARDIAC MURMUR, SYSTOLIC 12/02/5619  . Edema 05/12/2008    Past Surgical History  Procedure Laterality Date  . Abdominal US  09/1997  . Rest cardiolite  03/19/2003  . Lower arterial  04/13/2004  . Electrocardiogram  02/2006  .  Colonoscopy  08/18/2004    diverticulitis  . Colonoscopy  09/24/2009  . Colon cancer screening    . Flexible sigmoidoscopy  03/04/2001    polyps, anal fissure    History   Social History  . Marital Status: Married    Spouse Name: N/A    Number of Children: N/A  . Years of Education: N/A   Occupational History  .     Social History Main Topics  . Smoking status: Former Smoker -- 2.00 packs/day for 31 years    Types: Cigarettes    Quit date: 10/23/1982  . Smokeless tobacco: Never Used  . Alcohol Use: No  . Drug Use: No  . Sexual Activity: Not on file   Other Topics Concern  . Not on file   Social History Narrative    Current Outpatient Prescriptions on File Prior to Visit  Medication Sig Dispense Refill  . ACCU-CHEK FASTCLIX LANCETS MISC 1 Device by Does not apply route 4 (four) times daily. 360 each 3  . alfuzosin (UROXATRAL) 10 MG 24 hr tablet Take 1 tablet (10 mg total) by mouth daily with  breakfast. 90 tablet 1  . allopurinol (ZYLOPRIM) 300 MG tablet Take 1 tablet by mouth  daily 90 tablet 1  . aspirin (BAYER LOW STRENGTH) 81 MG EC tablet Take 81 mg by mouth daily.      . Blood Glucose Monitoring Suppl (ACCU-CHEK  AVIVA PLUS) W/DEVICE KIT Use as directed 1 kit 0  . celecoxib (CELEBREX) 200 MG capsule Take 1 capsule by mouth  daily 90 capsule 3  . CRESTOR 40 MG tablet Take 1 tablet by mouth at  bedtime 90 tablet 1  . cycloSPORINE (RESTASIS) 0.05 % ophthalmic emulsion Place 1 drop into both eyes 2 (two) times daily.      . folic acid (FOLVITE) 1 MG tablet 4 tabs daily    . furosemide (LASIX) 20 MG tablet Take 1 tablet (20 mg total) by mouth daily. 90 tablet prn  . glucose blood (ACCU-CHEK AVIVA) test strip 1 each by Other route 2 (two) times daily. And lancets 2/day 250.01 100 each 12  . insulin lispro (HUMALOG) 100 UNIT/ML injection Use for pump 65 units per day. 2 vial 2  . Multiple Vitamins-Minerals (CENTRUM SILVER PO) Take 1 tablet by mouth daily.      . Omega-3  Fatty Acids (FISH OIL) 1200 MG CAPS Take 1 capsule by mouth daily.      Marland Kitchen omeprazole (PRILOSEC) 20 MG capsule Take 1 capsule by mouth  daily as needed 90 capsule 1  . traMADol (ULTRAM) 50 MG tablet Take 1 tablet by mouth every 4 (four) hours as needed for pain. 100 tablet prn  . Transparent Dressings (TRANSPARENT I.V. SITE DRESSING) MISC 1 Device by Does not apply route every 3 (three) days. Tamala Julian and nephew 2 3/8 x 2 3/4 opsite iv 3000 frame delivery 30 each 3   No current facility-administered medications on file prior to visit.    Allergies  Allergen Reactions  . Atorvastatin   . Niacin     REACTION: Severe heartburn  . Pioglitazone     REACTION: Edema    Family History  Problem Relation Age of Onset  . Cancer Brother     colon cancer  . Cancer Brother     lung cancer    BP 144/85 mmHg  Pulse 85  Temp(Src) 98.2 F (36.8 C) (Oral)  Ht 6' (1.829 m)  Wt 270 lb (122.471 kg)  BMI 36.61 kg/m2  SpO2 95%    Review of Systems He has lost weight, due to his efforts.  He denies hypoglycemia.      Objective:   Physical Exam VITAL SIGNS:  See vs page GENERAL: no distress Pulses: dorsalis pedis intact bilat.   Feet: no deformity.  Trace bilat leg edema Skin:  no ulcer on the feet.  normal color and temp. Neuro: sensation is intact to touch on the feet.         Assessment & Plan:  DM: apparently well-controlled Paresthesias, new, prob neuropathic HTN: worse.  Patient is advised the following: Patient Instructions  Please continue basal rate to 1.8 units/hr, 24 hrs per day.   continue the mealtime bolus of 1 unit/8 grams carbohydrate.  continue correction bolus (which some people call "sensitivity," or "insulin sensitivity ratio," or just "isr") of 1 unit for each 10 by which your glucose exceeds 100. Please come back for a follow-up appointment in 3 months.  check your blood sugar 6 times a day.  vary the time of day when you check, between before the 3 meals, and at  bedtime.  also check if you have symptoms of your blood sugar being too high or too low.  please keep a record of the readings and bring it to your next appointment here.  You can write it on any piece of paper.  please call us sooner  if your blood sugar goes below 70, or if you have a lot of readings over 200. blood tests are being requested for you today.  We'll contact you with results.  i have sent a prescription to your pharmacy, for your blood pressure.   Please see a neurology specialist.  you will receive a phone call, about a day and time for an appointment.

## 2014-09-09 ENCOUNTER — Encounter: Payer: Self-pay | Admitting: Endocrinology

## 2014-10-26 ENCOUNTER — Other Ambulatory Visit: Payer: Self-pay | Admitting: Endocrinology

## 2014-10-27 ENCOUNTER — Encounter: Payer: Self-pay | Admitting: Neurology

## 2014-10-27 ENCOUNTER — Ambulatory Visit (INDEPENDENT_AMBULATORY_CARE_PROVIDER_SITE_OTHER): Payer: Medicare Other | Admitting: Neurology

## 2014-10-27 VITALS — BP 144/84 | HR 72 | Ht 72.0 in | Wt 267.5 lb

## 2014-10-27 DIAGNOSIS — R202 Paresthesia of skin: Secondary | ICD-10-CM

## 2014-10-27 DIAGNOSIS — M4806 Spinal stenosis, lumbar region: Secondary | ICD-10-CM

## 2014-10-27 DIAGNOSIS — M48062 Spinal stenosis, lumbar region with neurogenic claudication: Secondary | ICD-10-CM

## 2014-10-27 DIAGNOSIS — E1042 Type 1 diabetes mellitus with diabetic polyneuropathy: Secondary | ICD-10-CM

## 2014-10-27 NOTE — Patient Instructions (Signed)
1.  MRI lumbar spine 2.  EMG of left arm and leg 3.  Return to clinic in 6-8 weeks after above testing

## 2014-10-27 NOTE — Telephone Encounter (Signed)
Please advise if ok to refill. Medication is not on current medication list.  Thanks!

## 2014-10-27 NOTE — Progress Notes (Signed)
[facility] Clinic Note - Initial Visit   Date: 10/27/2014  [name] MRN: [id number] DOB: 02/03/1941   Dear Dr. Loanne Drilling:  Thank you for your kind referral of [name] for consultation of paresthesias of the hands and feet. Although his history is well known to you, please allow [place] to reiterate it for the purpose of our medical record. The patient was accompanied to the clinic by self.    History of Present Illness: [name] is a 74 y.o. right-handed Caucasian male with insulin-dependent type I diabetes (diagnosed 1986, HbA1c 6.3), morbid obesity, OSA, hyperlipidemia, and GERD presenting for evaluation of numbness involving his hands and feet.    Starting around early 2000s, he developing tingling of the toes and later involved his fingers.  Currently, he has tingling involving entire legs worse in the feet (left > right) and tingling over the shoulders and hands.  Symptoms are constant.  He has noticed mild difficulty with fine finger movements and has greater problem getting up from a low position.  No falls or imbalance.   He also endorses low back pain with prolonged walking and has to take breaks every 0.25 miles.  Once he rests, his pain is relieved.  He prefers to use the shopping cart for comfort when at the grocery store.    He had skin biopsy performed by podiatry in 06/2014 which returned normal.  His HbA1c was ranging 7-9.0 and only recently after having insulin pump, have his sugars been under much better control, last HbA1c 6.3.  Out-side paper records, electronic medical record, and images have been reviewed where available and summarized as:  Lab Results  Component Value Date   HGBA1C 6.3 09/07/2014   Lab Results  Component Value Date   JSEGBTDV76 160 12/30/2013   Lab Results  Component Value Date   TSH 3.96 09/07/2014   Skin biopsy 07/13/2014:  Normal   MRI lumbar spine wo contrast 02/22/2005: 1. Lower lumbar facet arthropathy.  2.  Small central protrusion L5-S1 without neural encroachment.  Past Medical History  Diagnosis Date  . Hepatic steatosis   . Urolithiasis   . Hypogonadism male   . Gynecomastia   . Anal fissure   . DM type 1 with diabetic peripheral neuropathy   . Personal history of colonic polyps   . Anemia, unspecified   . Duodenitis   . Esophageal stricture   . Arthritis     Spinal OA  . DIABETES MELLITUS, TYPE I 05/20/2007  . HYPERLIPIDEMIA 05/20/2007  . 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 Morbid obesity 09/10/2009  . OBSTRUCTIVE SLEEP APNEA 11/04/2007  . DIASTOLIC DYSFUNCTION 7/37/1062  . RENAL INSUFFICIENCY 05/12/2008  . BACK PAIN, LUMBAR 10/23/2007  . CARDIAC MURMUR, SYSTOLIC 03/31/4853  . Edema 05/12/2008    Past Surgical History  Procedure Laterality Date  . Abdominal US  09/1997  . Rest cardiolite  03/19/2003  . Lower arterial  04/13/2004  . Electrocardiogram  02/2006  . Colonoscopy  08/18/2004    diverticulitis  . Colonoscopy  09/24/2009  . Colon cancer screening    . Flexible sigmoidoscopy  03/04/2001    polyps, anal fissure     Medications:  Current Outpatient Prescriptions on File Prior to Visit  Medication Sig Dispense Refill  . ACCU-CHEK FASTCLIX LANCETS MISC 1 Device by Does not apply route 4 (four) times daily. 360 each 3  . alfuzosin (UROXATRAL) 10 MG 24 hr tablet Take 1 tablet (10 mg total) by mouth daily with  breakfast. 90  tablet 1  . allopurinol (ZYLOPRIM) 300 MG tablet Take 1 tablet by mouth  daily 90 tablet 1  . aspirin (BAYER LOW STRENGTH) 81 MG EC tablet Take 81 mg by mouth daily.      . Blood Glucose Monitoring Suppl (ACCU-CHEK AVIVA PLUS) W/DEVICE KIT Use as directed 1 kit 0  . carvedilol (COREG) 3.125 MG tablet Take 1 tablet (3.125 mg total) by mouth 2 (two) times daily with a meal. 180 tablet 3  . celecoxib (CELEBREX) 200 MG capsule Take 1 capsule by mouth  daily 90 capsule 3  . CRESTOR 40 MG tablet Take 1 tablet by mouth at  bedtime 90 tablet 1  . cycloSPORINE (RESTASIS) 0.05 %  ophthalmic emulsion Place 1 drop into both eyes 2 (two) times daily.      . folic acid (FOLVITE) 1 MG tablet 4 tabs daily    . furosemide (LASIX) 20 MG tablet Take 1 tablet by mouth  daily 90 tablet 3  . glucose blood (ACCU-CHEK AVIVA) test strip 1 each by Other route 2 (two) times daily. And lancets 2/day 250.01 100 each 12  . HUMALOG 100 UNIT/ML injection Inject subcutaneously for  pump 65 units per day 60 mL 1  . insulin lispro (HUMALOG) 100 UNIT/ML injection Use for pump 65 units per day. 2 vial 2  . Multiple Vitamins-Minerals (CENTRUM SILVER PO) Take 1 tablet by mouth daily.      . Omega-3 Fatty Acids (FISH OIL) 1200 MG CAPS Take 1 capsule by mouth daily.      Marland Kitchen omeprazole (PRILOSEC) 20 MG capsule Take 1 capsule by mouth  daily as needed 90 capsule 1  . traMADol (ULTRAM) 50 MG tablet Take 1 tablet by mouth every 4 (four) hours as needed for pain. 100 tablet prn  . Transparent Dressings (TRANSPARENT I.V. SITE DRESSING) MISC 1 Device by Does not apply route every 3 (three) days. Tamala Julian and nephew 2 3/8 x 2 3/4 opsite iv 3000 frame delivery 30 each 3   No current facility-administered medications on file prior to visit.    Allergies:  Allergies  Allergen Reactions  . Atorvastatin   . Niacin     REACTION: Severe heartburn  . Pioglitazone     REACTION: Edema    Family History: Family History  Problem Relation Age of Onset  . Cancer Brother     colon cancer  . Cancer Brother     lung cancer    Social History: History   Social History  . Marital Status: Married    Spouse Name: N/A    Number of Children: N/A  . Years of Education: N/A   Occupational History  .     Social History Main Topics  . Smoking status: Former Smoker -- 2.00 packs/day for 31 years    Types: Cigarettes    Quit date: 10/23/1982  . Smokeless tobacco: Never Used  . Alcohol Use: No  . Drug Use: No  . Sexual Activity: Not on file   Other Topics Concern  . Not on file   Social History Narrative     Review of Systems:  CONSTITUTIONAL: No fevers, chills, night sweats, + 30lb intentional weight loss.   EYES: No visual changes or eye pain ENT: No hearing changes.  No history of nose bleeds.   RESPIRATORY: No cough, wheezing and shortness of breath.   CARDIOVASCULAR: Negative for chest pain, and palpitations.   GI: Negative for abdominal discomfort, blood in stools or black stools.  No  recent change in bowel habits.   GU:  No history of incontinence.   MUSCLOSKELETAL: No history of joint pain or swelling.  No myalgias.   SKIN: Negative for lesions, rash, and itching.   HEMATOLOGY/ONCOLOGY: Negative for prolonged bleeding, bruising easily, and swollen nodes.    ENDOCRINE: Negative for cold or heat intolerance, polydipsia or goiter.   PSYCH:  No depression or anxiety symptoms.   NEURO: As Above.   Vital Signs:  BP 144/84 mmHg  Pulse 72  Ht 6' (1.829 m)  Wt 267 lb 8 oz (121.337 kg)  BMI 36.27 kg/m2  SpO2 98%   General Medical Exam:   General:  Well appearing, comfortable.   Eyes/ENT: see cranial nerve examination.   Neck: No masses appreciated.  Full range of motion without tenderness.  No carotid bruits. Respiratory:  Clear to auscultation, good air entry bilaterally.   Cardiac:  Regular rate and rhythm, no murmur.   Extremities:  No deformities, edema, or skin discoloration.  Skin:  No rashes or lesions.  Neurological Exam: MENTAL STATUS including orientation to time, place, person, recent and remote memory, attention span and concentration, language, and fund of knowledge is normal.  Speech is not dysarthric.  CRANIAL NERVES: II:  No visual field defects.  Unremarkable fundi.   III-IV-VI: Pupils equal round and reactive to light.  Normal conjugate, extra-ocular eye movements in all directions of gaze.  No nystagmus.  No ptosis.   V:  Normal facial sensation.  VII:  Normal facial symmetry and movements.   VIII:  Normal hearing and vestibular function.   IX-X:  Normal  palatal movement.   XI:  Normal shoulder shrug and head rotation.   XII:  Normal tongue strength and range of motion, no deviation or fasciculation.  MOTOR:  Mild intrinsic hand atrophy.  No fasciculations or abnormal movements.  No pronator drift.  Tone is normal.    Right Upper Extremity:    Left Upper Extremity:    Deltoid  5/5   Deltoid  5/5   Biceps  5/5   Biceps  5/5   Triceps  5/5   Triceps  5/5   Wrist extensors  5/5   Wrist extensors  5/5   Wrist flexors  5/5   Wrist flexors  5/5   Finger extensors  5/5   Finger extensors  5/5   Finger flexors  5/5   Finger flexors  5/5   Dorsal interossei  5/5   Dorsal interossei  5/5   Abductor pollicis  5/5   Abductor pollicis  5/5   Tone (Ashworth scale)  0  Tone (Ashworth scale)  0   Right Lower Extremity:    Left Lower Extremity:    Hip flexors  5/5   Hip flexors  5/5   Hip extensors  5/5   Hip extensors  5/5   Knee flexors  5/5   Knee flexors  5/5   Knee extensors  5/5   Knee extensors  5/5   Dorsiflexors  5/5   Dorsiflexors  5/5   Plantarflexors  5/5   Plantarflexors  5/5   Toe extensors  5/5   Toe extensors  5/5   Toe flexors  5/5   Toe flexors  5/5   Tone (Ashworth scale)  0  Tone (Ashworth scale)  0   MSRs:  Right  Left brachioradialis 2+  brachioradialis 2+  biceps 2+  biceps 2+  triceps 2+  triceps 2+  patellar 2+  patellar 2+  ankle jerk 0  ankle jerk 0  Hoffman no  Hoffman no  plantar response down  plantar response down   SENSORY:  Vibration reduced to 80% at ankles and absent at toes bilaterally.  Proprioception, temperature, and pin pick intact throughout.  Romberg's sign absent.   COORDINATION/GAIT: Normal finger-to- nose-finger and heel-to-shin.  Intact rapid alternating movements bilaterally.  Able to rise from a chair without using arms.  Gait narrow based and stable.  Mild unsteadiness with tandem gait.  Stressed gait intact.    IMPRESSION: 1.   Large fiber diabetic neuropathy causing distal hand and feet paresthesias 2.  Proximal arm and leg paresthesias most likely due cervical and lumbosacral radiculopathy  3.  Neurogenic claudication due to lumbar spinal stenosis   PLAN/RECOMMENDATIONS:  EMG of the left arm and leg MRI lumbar spine wo contrast Neuralgesic medication deferred by patient PT deferred until work-up complete Return to clinic in 2 months.   The duration of this appointment visit was 45 minutes of face-to-face time with the patient.  Greater than 50% of this time was spent in counseling, explanation of diagnosis, planning of further management, and coordination of care.   Thank you for allowing me to participate in patient's care.  If I can answer any additional questions, I would be pleased to do so.    Sincerely,    Donika K. Posey Pronto, DO

## 2014-10-29 ENCOUNTER — Encounter: Payer: Self-pay | Admitting: Gastroenterology

## 2014-11-06 ENCOUNTER — Ambulatory Visit
Admission: RE | Admit: 2014-11-06 | Discharge: 2014-11-06 | Disposition: A | Discharge status: Home / Self Care | Payer: Medicare Other | Source: Ambulatory Visit | Attending: Neurology | Admitting: Neurology

## 2014-11-06 DIAGNOSIS — M48062 Spinal stenosis, lumbar region with neurogenic claudication: Secondary | ICD-10-CM

## 2014-11-06 DIAGNOSIS — E1042 Type 1 diabetes mellitus with diabetic polyneuropathy: Secondary | ICD-10-CM

## 2014-11-09 ENCOUNTER — Telehealth: Payer: Self-pay | Admitting: Neurology

## 2014-11-09 NOTE — Telephone Encounter (Signed)
I attempted to contact patient via phone today regarding the results of MRI lumbar spine, however there was no answer so a message was left for the patient to return my call.

## 2014-11-09 NOTE — Telephone Encounter (Signed)
Pt resch EMG appt due to Dr Posey Pronto out of the office on 12-07-14 to 12-10-14

## 2014-11-10 ENCOUNTER — Telehealth: Payer: Self-pay

## 2014-11-10 MED ORDER — CELECOXIB 200 MG PO CAPS: ORAL_CAPSULE | ORAL | Status: DC

## 2014-11-10 NOTE — Telephone Encounter (Signed)
Rx sent 

## 2014-11-10 NOTE — Telephone Encounter (Addendum)
Received a refill request for Celebrex. Pt was last seen on 09/07/2014 and medication was last refilled on 08/10/2014.  Ok to refill? Thanks!

## 2014-11-10 NOTE — Telephone Encounter (Signed)
ok 

## 2014-11-20 ENCOUNTER — Encounter: Payer: Self-pay | Admitting: Endocrinology

## 2014-11-23 ENCOUNTER — Other Ambulatory Visit: Payer: Self-pay | Admitting: Endocrinology

## 2014-11-25 ENCOUNTER — Encounter: Payer: Self-pay | Admitting: Endocrinology

## 2014-11-25 ENCOUNTER — Ambulatory Visit (INDEPENDENT_AMBULATORY_CARE_PROVIDER_SITE_OTHER): Payer: Medicare Other | Admitting: Endocrinology

## 2014-11-25 VITALS — BP 134/66 | HR 69 | Temp 98.4°F | Ht 72.0 in | Wt 267.0 lb

## 2014-11-25 DIAGNOSIS — E1022 Type 1 diabetes mellitus with diabetic chronic kidney disease: Secondary | ICD-10-CM | POA: Diagnosis not present

## 2014-11-25 DIAGNOSIS — Z23 Encounter for immunization: Secondary | ICD-10-CM | POA: Diagnosis not present

## 2014-11-25 DIAGNOSIS — H9190 Unspecified hearing loss, unspecified ear: Secondary | ICD-10-CM | POA: Diagnosis not present

## 2014-11-25 DIAGNOSIS — N189 Chronic kidney disease, unspecified: Secondary | ICD-10-CM | POA: Diagnosis not present

## 2014-11-25 LAB — HEMOGLOBIN A1C: Hgb A1c MFr Bld: 6.6 % — ABNORMAL HIGH (ref 4.6–6.5)

## 2014-11-25 NOTE — Progress Notes (Signed)
we discussed code status.  pt requests full code, but would not want to be started or maintained on artificial life-support measures if there was not a reasonable chance of recovery 

## 2014-11-25 NOTE — Patient Instructions (Addendum)
Please continue basal rate to 1.6 units/hr, 24 hrs per day.   continue the mealtime bolus of 1 unit/8 grams carbohydrate.  continue correction bolus (which some people call "sensitivity," or "insulin sensitivity ratio," or just "isr") of 1 unit for each 10 by which your glucose exceeds 100. Please come back for a follow-up appointment in 3 months.  blood tests are being requested for you today.  We'll let you know about the results. check your blood sugar 6 times a day.  vary the time of day when you check, between before the 3 meals, and at bedtime.  also check if you have symptoms of your blood sugar being too high or too low.  please keep a record of the readings and bring it to your next appointment here.  You can write it on any piece of paper.  please call us sooner if your blood sugar goes below 70, or if you have a lot of readings over 200. blood tests are being requested for you today.  We'll contact you with results.  good diet and exercise habits significanly improve the control of your diabetes.  please let me know if you wish to be referred to a dietician.  high blood sugar is very risky to your health.  you should see an eye doctor and dentist every year.  It is very important to get all recommended vaccinations.  please consider these measures for your health:  minimize alcohol.  do not use tobacco products.  have a colonoscopy at least every 10 years from age 16.  keep firearms safely stored.  always use seat belts.  have working smoke alarms in your home.  see an eye doctor and dentist regularly.  never drive under the influence of alcohol or drugs (including prescription drugs).  those with fair skin should take precautions against the sun. it is critically important to prevent falling down (keep floor areas well-lit, dry, and free of loose objects.  If you have a cane, walker, or wheelchair, you should use it, even for short trips around the house.  Also, try not to rush)

## 2014-11-25 NOTE — Progress Notes (Signed)
Subjective:    Patient ID: [name], male    DOB: 1941/09/01, 74 y.o.   MRN: [id number]  HPI Pt returns for f/u of diabetes mellitus: DM type: Insulin-requiring type 2 Dx'ed: 8469 Complications: polyneuropathy and renal insufficiency. Therapy: insulin since 1995 DKA: never Severe hypoglycemia: never. Pancreatitis: never Other:  he started pump therapy in mid-2015 Interval history: He is still considering weight-loss surgery.   He takes these pump settings: basal rate of 1.6 units/hr, 24 hrs/day mealtime bolus of 1 unit/8 grams carbohydrate correction bolus (which some people call "sensitivity," or "insulin sensitivity ratio," or just "isr") of 1 unit for each 10 by which your glucose exceeds 100. He averages approx 70 units total, per day.  The plan is to start a continuous glucose monitor soon.  he brings a record of his cbg's which i have reviewed today, which will be scanned into epic. it is in general higher as the day goes on.   pt states he feels well in general. Past Medical History  Diagnosis Date  . Hepatic steatosis   . Urolithiasis   . Hypogonadism male   . Gynecomastia   . Anal fissure   . DM type 1 with diabetic peripheral neuropathy   . Personal history of colonic polyps   . Anemia, unspecified   . Duodenitis   . Esophageal stricture   . Arthritis     Spinal OA  . DIABETES MELLITUS, TYPE I 05/20/2007  . HYPERLIPIDEMIA 05/20/2007  . 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 Morbid obesity 09/10/2009  . OBSTRUCTIVE SLEEP APNEA 11/04/2007  . DIASTOLIC DYSFUNCTION 04/21/5283  . RENAL INSUFFICIENCY 05/12/2008  . BACK PAIN, LUMBAR 10/23/2007  . CARDIAC MURMUR, SYSTOLIC 10/25/2438  . Edema 05/12/2008    Past Surgical History  Procedure Laterality Date  . Abdominal US  09/1997  . Rest cardiolite  03/19/2003  . Lower arterial  04/13/2004  . Electrocardiogram  02/2006  . Colonoscopy  08/18/2004    diverticulitis  . Colonoscopy  09/24/2009  . Colon cancer screening    . Flexible  sigmoidoscopy  03/04/2001    polyps, anal fissure    History   Social History  . Marital Status: Married    Spouse Name: N/A    Number of Children: N/A  . Years of Education: N/A   Occupational History  .     Social History Main Topics  . Smoking status: Former Smoker -- 2.00 packs/day for 31 years    Types: Cigarettes    Quit date: 10/23/1982  . Smokeless tobacco: Never Used  . Alcohol Use: No  . Drug Use: No  . Sexual Activity: Not on file   Other Topics Concern  . Not on file   Social History Narrative   Lives with wife in a one story home.  Has a son and a daughter.     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     Education: 2 years of college.       Current Outpatient Prescriptions on File Prior to Visit  Medication Sig Dispense Refill  . ACCU-CHEK FASTCLIX LANCETS MISC 1 Device by Does not apply route 4 (four) times daily. 360 each 3  . alfuzosin (UROXATRAL) 10 MG 24 hr tablet Take 1 tablet by mouth  daily with breakfast 90 tablet 1  . allopurinol (ZYLOPRIM) 300 MG tablet Take 1 tablet by mouth  daily 90 tablet 1  . aspirin (BAYER LOW STRENGTH) 81 MG EC tablet Take 81 mg by mouth  daily.      . Blood Glucose Monitoring Suppl (ACCU-CHEK AVIVA PLUS) W/DEVICE KIT Use as directed 1 kit 0  . carvedilol (COREG) 3.125 MG tablet Take 1 tablet (3.125 mg total) by mouth 2 (two) times daily with a meal. 180 tablet 3  . celecoxib (CELEBREX) 200 MG capsule Take 1 capsule by mouth  daily 90 capsule 1  . CRESTOR 40 MG tablet Take 1 tablet by mouth at  bedtime 90 tablet 1  . cycloSPORINE (RESTASIS) 0.05 % ophthalmic emulsion Place 1 drop into both eyes 2 (two) times daily.      . folic acid (FOLVITE) 1 MG tablet 4 tabs daily    . furosemide (LASIX) 20 MG tablet Take 1 tablet by mouth  daily 90 tablet 3  . glucose blood (ACCU-CHEK AVIVA) test strip 1 each by Other route 2 (two) times daily. And lancets 2/day 250.01 100 each 12  . HUMALOG 100 UNIT/ML injection Inject  subcutaneously for  pump 65 units per day 60 mL 1  . Insulin Infusion Pump Supplies (ACCU-CHEK PLASTIC CARTRIDGE) MISC     . insulin lispro (HUMALOG) 100 UNIT/ML injection Use for pump 65 units per day. 2 vial 2  . Multiple Vitamins-Minerals (CENTRUM SILVER PO) Take 1 tablet by mouth daily.      . Omega-3 Fatty Acids (FISH OIL) 1200 MG CAPS Take 1 capsule by mouth daily.      Marland Kitchen omeprazole (PRILOSEC) 20 MG capsule Take 1 capsule by mouth  daily as needed 90 capsule 1  . traMADol (ULTRAM) 50 MG tablet Take 1 tablet by mouth every 4 (four) hours as needed for pain. 100 tablet prn  . Transparent Dressings (TRANSPARENT I.V. SITE DRESSING) MISC 1 Device by Does not apply route every 3 (three) days. Tamala Julian and nephew 2 3/8 x 2 3/4 opsite iv 3000 frame delivery 30 each 3   No current facility-administered medications on file prior to visit.    Allergies  Allergen Reactions  . Atorvastatin   . Niacin     REACTION: Severe heartburn  . Pioglitazone     REACTION: Edema    Family History  Problem Relation Age of Onset  . Cancer Brother     colon cancer  . Cancer Brother     lung cancer, deceased  . Healthy Daughter   . Healthy Son   . Other Mother     MVA, deceased 59s    BP 134/66 mmHg  Pulse 69  Temp(Src) 98.4 F (36.9 C) (Oral)  Ht 6' (1.829 m)  Wt 267 lb (121.11 kg)  BMI 36.20 kg/m2  SpO2 96%  Review of Systems He denies hypoglycemia.  He has lost a few more lbs.    Objective:   Physical Exam VITAL SIGNS:  See vs page GENERAL: no distress Pulses: dorsalis pedis intact bilat.   MSK: no deformity of the feet CV: no leg edema Skin:  no ulcer on the feet.  normal color and temp on the feet. Neuro: sensation is intact to touch on the feet.        Assessment & Plan:  DM: well-controlled: Please continue the same pump settings    Subjective:   Patient here for Medicare annual wellness visit and management of other chronic and acute problems.     Risk factors:  advanced age    43 of Physicians Providing Medical Care to Patient:  See "snapshot"   Activities of Daily Living: In your present state of health, do you  have any difficulty performing the following activities?:  Preparing food and eating?: No  Bathing yourself: No  Getting dressed: No  Using the toilet:No  Moving around from place to place: No  In the past year have you fallen or had a near fall?: No    Home Safety: Has smoke detector and wears seat belts. Firearms are safely stored. No excess sun exposure.  Diet and Exercise  Current exercise habits: pt says not good. Dietary issues discussed: pt reports a healthy diet   Depression Screen  Q1: Over the past two weeks, have you felt down, depressed or hopeless? no  Q2: Over the past two weeks, have you felt little interest or pleasure in doing things? no   The following portions of the patient's history were reviewed and updated as appropriate: allergies, current medications, past family history, past medical history, past social history, past surgical history and problem list.   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  remembers 3/3 at 5 minutes.  excellent recall.  can easily read and write a sentence.  alert and oriented x 3   Assessment:   Medicare wellness utd on preventive parameters    Plan:   During the course of the visit the patient was educated and counseled about appropriate screening and preventive services including:       Fall prevention   Screening mammography  Bone densitometry screening  Diabetes screening  Nutrition counseling   Vaccines / LABS Zostavax / Pneumococcal Vaccine  today  PSA  Patient Instructions (the  written plan) was given to the patient.

## 2014-11-28 ENCOUNTER — Encounter (HOSPITAL_COMMUNITY): Payer: Self-pay | Admitting: Emergency Medicine

## 2014-11-28 ENCOUNTER — Emergency Department (HOSPITAL_COMMUNITY)
Admission: EM | Admit: 2014-11-28 | Discharge: 2014-11-28 | Disposition: A | Discharge status: Home / Self Care | Payer: Medicare Other | Attending: Emergency Medicine | Admitting: Emergency Medicine

## 2014-11-28 ENCOUNTER — Emergency Department (HOSPITAL_COMMUNITY): Payer: Medicare Other

## 2014-11-28 DIAGNOSIS — N200 Calculus of kidney: Secondary | ICD-10-CM

## 2014-11-28 DIAGNOSIS — M1990 Unspecified osteoarthritis, unspecified site: Secondary | ICD-10-CM | POA: Diagnosis not present

## 2014-11-28 DIAGNOSIS — Z8719 Personal history of other diseases of the digestive system: Secondary | ICD-10-CM | POA: Diagnosis not present

## 2014-11-28 DIAGNOSIS — R011 Cardiac murmur, unspecified: Secondary | ICD-10-CM | POA: Diagnosis not present

## 2014-11-28 DIAGNOSIS — Z87891 Personal history of nicotine dependence: Secondary | ICD-10-CM | POA: Diagnosis not present

## 2014-11-28 DIAGNOSIS — Z7982 Long term (current) use of aspirin: Secondary | ICD-10-CM | POA: Diagnosis not present

## 2014-11-28 DIAGNOSIS — Z791 Long term (current) use of non-steroidal anti-inflammatories (NSAID): Secondary | ICD-10-CM | POA: Insufficient documentation

## 2014-11-28 DIAGNOSIS — Z862 Personal history of diseases of the blood and blood-forming organs and certain disorders involving the immune mechanism: Secondary | ICD-10-CM | POA: Insufficient documentation

## 2014-11-28 DIAGNOSIS — E109 Type 1 diabetes mellitus without complications: Secondary | ICD-10-CM | POA: Insufficient documentation

## 2014-11-28 DIAGNOSIS — Z86010 Personal history of colonic polyps: Secondary | ICD-10-CM | POA: Diagnosis not present

## 2014-11-28 DIAGNOSIS — R911 Solitary pulmonary nodule: Secondary | ICD-10-CM

## 2014-11-28 DIAGNOSIS — R10A1 Flank pain, right side: Secondary | ICD-10-CM

## 2014-11-28 DIAGNOSIS — M109 Gout, unspecified: Secondary | ICD-10-CM | POA: Insufficient documentation

## 2014-11-28 DIAGNOSIS — Z79899 Other long term (current) drug therapy: Secondary | ICD-10-CM | POA: Insufficient documentation

## 2014-11-28 DIAGNOSIS — R109 Unspecified abdominal pain: Secondary | ICD-10-CM

## 2014-11-28 DIAGNOSIS — Z794 Long term (current) use of insulin: Secondary | ICD-10-CM | POA: Diagnosis not present

## 2014-11-28 LAB — URINE MICROSCOPIC-ADD ON

## 2014-11-28 LAB — COMPREHENSIVE METABOLIC PANEL
ALT: 27 U/L (ref 0–53)
ANION GAP: 8 (ref 5–15)
AST: 22 U/L (ref 0–37)
Albumin: 4.2 g/dL (ref 3.5–5.2)
Alkaline Phosphatase: 70 U/L (ref 39–117)
BUN: 37 mg/dL — ABNORMAL HIGH (ref 6–23)
CO2: 27 mmol/L (ref 19–32)
CREATININE: 1.57 mg/dL — AB (ref 0.50–1.35)
Calcium: 9.8 mg/dL (ref 8.4–10.5)
Chloride: 104 mmol/L (ref 96–112)
GFR calc Af Amer: 49 mL/min — ABNORMAL LOW (ref >90)
GFR, EST NON AFRICAN AMERICAN: 42 mL/min — AB (ref >90)
Glucose, Bld: 178 mg/dL — ABNORMAL HIGH (ref 70–99)
POTASSIUM: 3.8 mmol/L (ref 3.5–5.1)
Sodium: 139 mmol/L (ref 135–145)
TOTAL PROTEIN: 7.2 g/dL (ref 6.0–8.3)
Total Bilirubin: 0.8 mg/dL (ref 0.3–1.2)

## 2014-11-28 LAB — URINALYSIS, ROUTINE W REFLEX MICROSCOPIC
BILIRUBIN URINE: NEGATIVE
Glucose, UA: NEGATIVE mg/dL
KETONES UR: NEGATIVE mg/dL
NITRITE: NEGATIVE
PROTEIN: NEGATIVE mg/dL
Specific Gravity, Urine: 1.013 (ref 1.005–1.030)
Urobilinogen, UA: 0.2 mg/dL (ref 0.0–1.0)
pH: 5.5 (ref 5.0–8.0)

## 2014-11-28 LAB — CBC WITH DIFFERENTIAL/PLATELET
BASOS ABS: 0 10*3/uL (ref 0.0–0.1)
Basophils Relative: 0 % (ref 0–1)
Eosinophils Absolute: 0.1 10*3/uL (ref 0.0–0.7)
Eosinophils Relative: 1 % (ref 0–5)
HEMATOCRIT: 45.4 % (ref 39.0–52.0)
HEMOGLOBIN: 15.5 g/dL (ref 13.0–17.0)
Lymphocytes Relative: 32 % (ref 12–46)
Lymphs Abs: 1.8 10*3/uL (ref 0.7–4.0)
MCH: 31.1 pg (ref 26.0–34.0)
MCHC: 34.1 g/dL (ref 30.0–36.0)
MCV: 91.2 fL (ref 78.0–100.0)
Monocytes Absolute: 0.3 10*3/uL (ref 0.1–1.0)
Monocytes Relative: 6 % (ref 3–12)
NEUTROS ABS: 3.3 10*3/uL (ref 1.7–7.7)
NEUTROS PCT: 61 % (ref 43–77)
PLATELETS: 108 10*3/uL — AB (ref 150–400)
RBC: 4.98 MIL/uL (ref 4.22–5.81)
RDW: 14.8 % (ref 11.5–15.5)
WBC: 5.5 10*3/uL (ref 4.0–10.5)

## 2014-11-28 MED ORDER — HYDROMORPHONE HCL 1 MG/ML IJ SOLN
INTRAMUSCULAR | Status: AC
  Administered 2014-11-28: 0.5 mg via INTRAVENOUS
  Filled 2014-11-28: qty 1

## 2014-11-28 MED ORDER — PROMETHAZINE HCL 25 MG/ML IJ SOLN
12.5000 mg | Freq: Once | INTRAMUSCULAR | Status: AC
  Administered 2014-11-28: 12.5 mg via INTRAVENOUS
  Filled 2014-11-28: qty 1

## 2014-11-28 MED ORDER — ONDANSETRON HCL 4 MG/2ML IJ SOLN
4.0000 mg | Freq: Once | INTRAMUSCULAR | Status: AC
  Administered 2014-11-28: 4 mg via INTRAVENOUS
  Filled 2014-11-28: qty 2

## 2014-11-28 MED ORDER — HYDROMORPHONE HCL 1 MG/ML IJ SOLN
0.5000 mg | Freq: Once | INTRAMUSCULAR | Status: AC
  Administered 2014-11-28: 0.5 mg via INTRAVENOUS

## 2014-11-28 MED ORDER — KETOROLAC TROMETHAMINE 30 MG/ML IJ SOLN
30.0000 mg | Freq: Once | INTRAMUSCULAR | Status: AC
  Administered 2014-11-28: 30 mg via INTRAVENOUS
  Filled 2014-11-28: qty 1

## 2014-11-28 MED ORDER — OXYCODONE-ACETAMINOPHEN 5-325 MG PO TABS: 1.0000 | ORAL_TABLET | Freq: Four times a day (QID) | ORAL | Status: DC | PRN

## 2014-11-28 MED ORDER — SODIUM CHLORIDE 0.9 % IV BOLUS (SEPSIS)
500.0000 mL | Freq: Once | INTRAVENOUS | Status: AC
  Administered 2014-11-28: 500 mL via INTRAVENOUS

## 2014-11-28 MED ORDER — ONDANSETRON 4 MG PO TBDP: ORAL_TABLET | ORAL | Status: DC

## 2014-11-28 MED ORDER — HYDROMORPHONE HCL 1 MG/ML IJ SOLN
0.5000 mg | Freq: Once | INTRAMUSCULAR | Status: AC
  Administered 2014-11-28: 0.5 mg via INTRAVENOUS
  Filled 2014-11-28: qty 1

## 2014-11-28 MED ORDER — HYDROMORPHONE HCL 1 MG/ML IJ SOLN
1.0000 mg | Freq: Once | INTRAMUSCULAR | Status: AC
  Administered 2014-11-28: 1 mg via INTRAVENOUS
  Filled 2014-11-28: qty 1

## 2014-11-28 MED ORDER — TAMSULOSIN HCL 0.4 MG PO CAPS: 0.4000 mg | ORAL_CAPSULE | Freq: Every day | ORAL | Status: DC

## 2014-11-28 NOTE — ED Notes (Signed)
Around 11:30 pt began having sharp right lower quadrant pain, states around 12:00 it began radiating around his side and into his flank. States is a constant pain, he began vomiting on the way to the ER.

## 2014-11-28 NOTE — ED Provider Notes (Signed)
CSN: [id number]     Arrival date & time 11/28/14  1320 History   First MD Initiated Contact with Patient 11/28/14 1334     Chief Complaint  Patient presents with  . Abdominal Pain  . Emesis     (Consider location/radiation/quality/duration/timing/severity/associated sxs/prior Treatment) Patient is a 74 y.o. male presenting with abdominal pain and vomiting. The history is provided by the patient.  Abdominal Pain Pain location:  R flank Pain quality: aching and sharp   Pain radiates to:  Groin and R flank Pain severity:  Moderate Onset quality:  Sudden Duration:  2 hours Timing:  Constant Progression:  Worsening Chronicity:  New Context comment:  Remote hx of kidney stones Relieved by:  Nothing Worsened by:  Nothing tried Ineffective treatments:  None tried Associated symptoms: nausea and vomiting   Associated symptoms: no chest pain, no cough, no diarrhea, no dysuria, no fever, no hematuria and no shortness of breath   Emesis Associated symptoms: abdominal pain   Associated symptoms: no diarrhea and no headaches     Past Medical History  Diagnosis Date  . Hepatic steatosis   . Urolithiasis   . Hypogonadism male   . Gynecomastia   . Anal fissure   . DM type 1 with diabetic peripheral neuropathy   . Personal history of colonic polyps   . Anemia, unspecified   . Duodenitis   . Esophageal stricture   . Arthritis     Spinal OA  . DIABETES MELLITUS, TYPE I 05/20/2007  . HYPERLIPIDEMIA 05/20/2007  . 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 Morbid obesity 09/10/2009  . OBSTRUCTIVE SLEEP APNEA 11/04/2007  . DIASTOLIC DYSFUNCTION 9/83/3825  . RENAL INSUFFICIENCY 05/12/2008  . BACK PAIN, LUMBAR 10/23/2007  . CARDIAC MURMUR, SYSTOLIC 0/02/3975  . Edema 05/12/2008   Past Surgical History  Procedure Laterality Date  . Abdominal US  09/1997  . Rest cardiolite  03/19/2003  . Lower arterial  04/13/2004  . Electrocardiogram  02/2006  . Colonoscopy  08/18/2004    diverticulitis  . Colonoscopy  09/24/2009   . Colon cancer screening    . Flexible sigmoidoscopy  03/04/2001    polyps, anal fissure   Family History  Problem Relation Age of Onset  . Cancer Brother     colon cancer  . Cancer Brother     lung cancer, deceased  . Healthy Daughter   . Healthy Son   . Other Mother     MVA, deceased 56s   History  Substance Use Topics  . Smoking status: Former Smoker -- 2.00 packs/day for 31 years    Types: Cigarettes    Quit date: 10/23/1982  . Smokeless tobacco: Never Used  . Alcohol Use: No    Review of Systems  Constitutional: Negative for fever.  HENT: Negative for drooling and rhinorrhea.   Eyes: Negative for pain.  Respiratory: Negative for cough and shortness of breath.   Cardiovascular: Negative for chest pain and leg swelling.  Gastrointestinal: Positive for nausea, vomiting and abdominal pain. Negative for diarrhea.  Genitourinary: Negative for dysuria and hematuria.  Musculoskeletal: Negative for gait problem and neck pain.  Skin: Negative for color change.  Neurological: Negative for numbness and headaches.  Hematological: Negative for adenopathy.  Psychiatric/Behavioral: Negative for behavioral problems.  All other systems reviewed and are negative.     Allergies  Atorvastatin; Niacin; and Pioglitazone  Home Medications   Prior to Admission medications   Medication Sig Start Date End Date Taking? Authorizing Provider  ACCU-CHEK FASTCLIX LANCETS [place]  1 Device by Does not apply route 4 (four) times daily. 03/27/14   Renato Shin, MD  alfuzosin (UROXATRAL) 10 MG 24 hr tablet Take 1 tablet by mouth  daily with breakfast 11/24/14   Renato Shin, MD  allopurinol (ZYLOPRIM) 300 MG tablet Take 1 tablet by mouth  daily 11/24/14   Renato Shin, MD  aspirin (BAYER LOW STRENGTH) 81 MG EC tablet Take 81 mg by mouth daily.      Historical Provider, MD  Blood Glucose Monitoring Suppl (ACCU-CHEK AVIVA PLUS) W/DEVICE KIT Use as directed 07/29/14   Renato Shin, MD  carvedilol (COREG)  3.125 MG tablet Take 1 tablet (3.125 mg total) by mouth 2 (two) times daily with a meal. 09/07/14   Renato Shin, MD  celecoxib (CELEBREX) 200 MG capsule Take 1 capsule by mouth  daily 11/10/14   Renato Shin, MD  CRESTOR 40 MG tablet Take 1 tablet by mouth at  bedtime 11/24/14   Renato Shin, MD  cycloSPORINE (RESTASIS) 0.05 % ophthalmic emulsion Place 1 drop into both eyes 2 (two) times daily.      Historical Provider, MD  folic acid (FOLVITE) 1 MG tablet 4 tabs daily    Historical Provider, MD  furosemide (LASIX) 20 MG tablet Take 1 tablet by mouth  daily 10/27/14   Renato Shin, MD  glucose blood (ACCU-CHEK AVIVA) test strip 1 each by Other route 2 (two) times daily. And lancets 2/day 250.01 02/04/14   Renato Shin, MD  HUMALOG 100 UNIT/ML injection Inject subcutaneously for  pump 65 units per day 10/27/14   Renato Shin, MD  Insulin Infusion Pump Supplies (ACCU-CHEK PLASTIC CARTRIDGE) MISC  10/05/14   Historical Provider, MD  insulin lispro (HUMALOG) 100 UNIT/ML injection Use for pump 65 units per day. 07/30/14   Renato Shin, MD  Multiple Vitamins-Minerals (CENTRUM SILVER PO) Take 1 tablet by mouth daily.      Historical Provider, MD  Omega-3 Fatty Acids (FISH OIL) 1200 MG CAPS Take 1 capsule by mouth daily.      Historical Provider, MD  omeprazole (PRILOSEC) 20 MG capsule Take 1 capsule by mouth  daily as needed 07/29/14   Renato Shin, MD  traMADol (ULTRAM) 50 MG tablet Take 1 tablet by mouth every 4 (four) hours as needed for pain. 01/14/13   Renato Shin, MD  Transparent Dressings (TRANSPARENT I.V. SITE DRESSING) MISC 1 Device by Does not apply route every 3 (three) days. Tamala Julian and nephew 2 3/8 x 2 3/4 opsite iv 3000 frame delivery 05/06/14   Renato Shin, MD   BP 191/68 mmHg  Pulse 56  Temp(Src) 97.8 F (36.6 C) (Oral)  Resp 21  SpO2 98% Physical Exam  Constitutional: He is oriented to person, place, and time. He appears well-developed and well-nourished.  HENT:  Head: Normocephalic and  atraumatic.  Right Ear: External ear normal.  Left Ear: External ear normal.  Nose: Nose normal.  Mouth/Throat: Oropharynx is clear and moist. No oropharyngeal exudate.  Eyes: Conjunctivae and EOM are normal. Pupils are equal, round, and reactive to light.  Neck: Normal range of motion. Neck supple.  Cardiovascular: Normal rate, regular rhythm, normal heart sounds and intact distal pulses.  Exam reveals no gallop and no friction rub.   No murmur heard. Pulmonary/Chest: Effort normal and breath sounds normal. No respiratory distress. He has no wheezes.  Abdominal: Soft. Bowel sounds are normal. He exhibits no distension. There is no tenderness. There is no rebound and no guarding.  Musculoskeletal: Normal range of motion.  He exhibits no edema or tenderness.  Neurological: He is alert and oriented to person, place, and time.  Skin: Skin is warm and dry.  Psychiatric: He has a normal mood and affect. His behavior is normal.  Nursing note and vitals reviewed.   ED Course  Procedures (including critical care time) Labs Review Labs Reviewed  CBC WITH DIFFERENTIAL/PLATELET - Abnormal; Notable for the following:    Platelets 108 (*)    All other components within normal limits  COMPREHENSIVE METABOLIC PANEL - Abnormal; Notable for the following:    Glucose, Bld 178 (*)    BUN 37 (*)    Creatinine, Ser 1.57 (*)    GFR calc non Af Amer 42 (*)    GFR calc Af Amer 49 (*)    All other components within normal limits  URINALYSIS, ROUTINE W REFLEX MICROSCOPIC - Abnormal; Notable for the following:    Hgb urine dipstick LARGE (*)    Leukocytes, UA TRACE (*)    All other components within normal limits  URINE MICROSCOPIC-ADD ON    Imaging Review Ct Renal Stone Study  11/28/2014   CLINICAL DATA:  74 year old male with right flank pain.  EXAM: CT ABDOMEN AND PELVIS WITHOUT CONTRAST  TECHNIQUE: Multidetector CT imaging of the abdomen and pelvis was performed following the standard protocol  without IV contrast.  COMPARISON:  Prior MRI lumbar spine 11/06/2014  FINDINGS: Lower Chest: Two small subpleural pulmonary nodules, the largest which measures 5 mm in the anterolateral periphery of the right middle lobe (image 4 series 32) are highly likely benign subpleural lymph nodes. Otherwise, the lungs are clear save for trace dependent atelectasis in the lower lobes. Heart is within normal limits for size. Trace atherosclerotic calcifications within the coronary arteries. No pericardial effusion. Unremarkable distal thoracic esophagus.  Abdomen: Unenhanced CT was performed per clinician order. Lack of IV contrast limits sensitivity and specificity, especially for evaluation of abdominal/pelvic solid viscera. Within these limitations, unremarkable CT appearance of the stomach, duodenum, spleen, adrenal glands and pancreas. Normal hepatic contours and morphology. No discrete hepatic lesion. Stones layer within the gallbladder lumen. No intra or extrahepatic biliary ductal dilatation.  Moderate right hydronephrosis secondary to a a 3.5 x 5 mm stone in the right mid ureter. There is associated renal edema and perinephric stranding. No additional nephrolithiasis identified in the right kidney. A punctate 2 mm stone is present in the lower pole on the left. 1.4 cm low-attenuation structure exophytic from the right lower pole is incompletely characterized in the absence of intravenous contrast. Given the water attenuation, this is highly likely a benign cyst.  No evidence of obstruction or focal bowel wall thickening. Normal appendix in the right lower quadrant. The terminal ileum is unremarkable. Colonic diverticular disease without CT evidence of active inflammation. No free fluid or suspicious adenopathy.  Pelvis: Unremarkable bladder, prostate gland and seminal vesicles. No free fluid or suspicious adenopathy.  Bones/Soft Tissues: No acute fracture or aggressive appearing lytic or blastic osseous lesion. Focal  L5-S1 degenerative disc disease.  Vascular: Atherosclerotic vascular disease without significant stenosis or aneurysmal dilatation.  IMPRESSION: 1. Obstructing 3.5 x 5 mm stone in the right mid ureter resulting in right moderate hydronephrosis, renal edema and perinephric stranding. 2. Additional punctate nonobstructing nephrolithiasis in the left lower pole. 3. Calcified plaque in the visualized coronary arteries. 4. Incidental imaging of a small subpleural pulmonary nodules the largest which measures 5 mm. If the patient is at high risk for bronchogenic carcinoma, follow-up chest  CT at 6-12 months is recommended. If the patient is at low risk for bronchogenic carcinoma, follow-up chest CT at 12 months is recommended. This recommendation follows the consensus statement: Guidelines for Management of Small Pulmonary Nodules Detected on CT Scans: A Statement from the [place] as published in Radiology 2005;237:395-400. 5. Sigmoid colonic diverticulosis without evidence of active inflammation. 6. Cholelithiasis. 7. Focal L5-S1 degenerative disc disease.   Electronically Signed   By: Jacqulynn Cadet M.D.   On: 11/28/2014 14:40     EKG Interpretation None      MDM   Final diagnoses:  Right flank pain  Nephrolithiasis  Pulmonary nodule    1:59 PM 74 y.o. male with remote history of kidney stones, diabetes who presents with sudden onset right flank pain radiating to his groin which occurred prior to arrival. He had several episodes of emesis and route. He states that he has otherwise been well. Mildly hypertensive here but vital signs otherwise unremarkable. Likely a kidney stone, will work up accordingly.  Cr at baseline. Labs/UA otherwise non-contrib. Pain controlled.     I have discussed the diagnosis/risks/treatment options with the patient and family and believe the pt to be eligible for discharge home to follow-up with his urologist next week. We also discussed returning to the ED  immediately if new or worsening sx occur. We discussed the sx which are most concerning (e.g., worsening pain, fever, vomiting) that necessitate immediate return. Medications administered to the patient during their visit and any new prescriptions provided to the patient are listed below.  Medications given during this visit Medications  ondansetron (ZOFRAN) injection 4 mg (4 mg Intravenous Given 11/28/14 1411)  sodium chloride 0.9 % bolus 500 mL (0 mLs Intravenous Stopped 11/28/14 1520)  HYDROmorphone (DILAUDID) injection 0.5 mg (0.5 mg Intravenous Given 11/28/14 1411)  HYDROmorphone (DILAUDID) injection 1 mg (1 mg Intravenous Given 11/28/14 1451)  ketorolac (TORADOL) 30 MG/ML injection 30 mg (30 mg Intravenous Given 11/28/14 1521)  ondansetron (ZOFRAN) injection 4 mg (4 mg Intravenous Given 11/28/14 1521)  promethazine (PHENERGAN) injection 12.5 mg (12.5 mg Intravenous Given 11/28/14 1659)  HYDROmorphone (DILAUDID) injection 0.5 mg (0.5 mg Intravenous Given 11/28/14 1659)    Discharge Medication List as of 11/28/2014  3:55 PM    START taking these medications   Details  oxyCODONE-acetaminophen (PERCOCET) 5-325 MG per tablet Take 1-2 tablets by mouth every 6 (six) hours as needed., Starting 11/28/2014, Until Discontinued, Print    tamsulosin (FLOMAX) 0.4 MG CAPS capsule Take 1 capsule (0.4 mg total) by mouth daily., Starting 11/28/2014, Until Discontinued, Print         Pamella Pert, MD 11/29/14 [phone]

## 2014-12-07 ENCOUNTER — Encounter: Payer: Medicare Other | Admitting: Neurology

## 2014-12-10 ENCOUNTER — Ambulatory Visit (INDEPENDENT_AMBULATORY_CARE_PROVIDER_SITE_OTHER): Payer: Medicare Other | Admitting: Neurology

## 2014-12-10 DIAGNOSIS — M48062 Spinal stenosis, lumbar region with neurogenic claudication: Secondary | ICD-10-CM

## 2014-12-10 DIAGNOSIS — M4806 Spinal stenosis, lumbar region: Secondary | ICD-10-CM

## 2014-12-10 DIAGNOSIS — E1042 Type 1 diabetes mellitus with diabetic polyneuropathy: Secondary | ICD-10-CM

## 2014-12-10 NOTE — Procedures (Signed)
[facility]  [place], [address]  [place], [place] [address] Tel: [phone] Fax:  [phone] Test Date:  12/10/2014  Patient: [name] DOB: [birth year] Physician: Narda Amber  Sex: Male Height: 6' " Ref Phys: Narda Amber  ID#: [id number] Temp: 32.0C Technician: Laureen Ochs R. NCS T.   Patient Complaints: Patient is a 74 year old male here for evaluation of bilateral hand weakness and feet paresthesias.  NCV & EMG Findings: Extensive electrodiagnostic testing of the left upper extremity, left lower extremity, and additional studies of the right upper extremity shows:  1. Left median, ulnar, and radial sensory responses are within normal limits. 2. Left median and ulnar motor responses are within normal limits. 3. Left peroneal motor response recording at the extensor digitorum brevis is reduced, however, it is within normal limits at the tibialis anterior. Left tibial motor response is also reduced in amplitude.  4. In the arms, chronic motor axon loss changes are seen affecting the C8 myotomes bilaterally. 5. In the legs, chronic motor axon loss changes are seen affecting the distal leg muscles.  Impression: 1. 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 2. 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   ___________________________ Narda Amber    Nerve Conduction Studies Anti Sensory Summary Table   Stim Site NR Peak (ms) Norm Peak (ms) P-T Amp (V) Norm P-T Amp  Left Median Anti Sensory (2nd Digit)  32C    distance 14cm due to large hands  Wrist    3.5 <3.8 18.5 >10  Left Radial Anti Sensory (Base 1st Digit)  32C  Wrist    2.2 <2.8 18.4 >10  Left Sup Peroneal Anti Sensory (Ant Lat Mall)  32C  12 cm NR  <4.6  >3  Left Sural Anti Sensory (Lat Mall)   32C  Calf NR  <4.6  >3  Left Ulnar Anti Sensory (5th Digit)  32C    at 12cm  Wrist    3.1 <3.2 15.9 >5   Motor Summary Table   Stim Site NR Onset (ms) Norm Onset (ms) O-P Amp (mV) Norm O-P Amp Site1 Site2 Delta-0 (ms) Dist (cm) Vel (m/s) Norm Vel (m/s)  Left Median Motor (Abd Poll Brev)  32C  Wrist    3.6 <4.0 7.3 >5 Elbow Wrist 5.6 28.0 50 >50  Elbow    9.2  6.2         Left Peroneal Motor (Ext Dig Brev)  32C  Ankle    4.8 <6.0 1.0 >2.5 B Fib Ankle 11.2 0.0  >40  B Fib    16.0  0.8  Poplt B Fib 1.6 0.0  >40  Poplt    17.6  0.7         Left Peroneal TA Motor (Tib Ant)  32C  Fib Head    4.0 <4.5 4.3 >3 Poplit Fib Head 1.7 7.5 44 >40  Poplit    5.7  4.2         Left Tibial Motor (Abd Hall Brev)  32C  Ankle    5.3 <6.0 1.1 >4 Knee Ankle 12.7 43.0 34 >40  Knee    18.0  0.8         Left Ulnar Motor (Abd Dig Minimi)  32C  Wrist    2.7 <3.1 7.8 >7 B Elbow Wrist 4.8 24.0 50 >50  B  Elbow    7.5  6.8  A Elbow B Elbow 2.2 11.0 50 >50  A Elbow    9.7  6.9          EMG   Side Muscle Ins Act Fibs Psw Fasc Number Recrt Dur Dur. Amp Amp. Poly Poly. Comment  Left AntTibialis Nml Nml Nml Nml Nml Nml Nml Nml Nml Nml Nml Nml N/A  Left Gastroc Nml Nml Nml Nml 2- Mod-R Some 1+ Some 1+ Some 1+ N/A  Left Flex Dig Long Nml Nml Nml Nml 1- Mod-R Some 1+ Some 1+ Nml Nml N/A  Left RectFemoris Nml Nml Nml Nml Nml Nml Nml Nml Nml Nml Nml Nml N/A  Left GluteusMed Nml Nml Nml Nml Nml Nml Nml Nml Nml Nml Nml Nml N/A  Left BicepsFemS Nml Nml Nml Nml Nml Nml Nml Nml Nml Nml Nml Nml N/A  Left 1stDorInt Nml Nml Nml Nml 2- Rapid Many 1+ Many 1+ Some 1+ N/A  Left Ext Indicis Nml Nml Nml Nml 1- Mod-R Some 1+ Some 1+ Nml Nml N/A  Left FlexPolLong Nml Nml Nml Nml Nml Nml Nml Nml Nml Nml Nml Nml N/A  Left ABD Dig Min Nml Nml Nml Nml 2- Mod-R Some 1+ Some 1+ Nml Nml N/A  Right FlexPolBrevis Nml Nml Nml Nml 1- Mod-R Some 1+ Some 1+ Nml Nml N/A  Right 1stDorInt Nml Nml Nml Nml 1- Mod-R Some 1+ Few 1+ Nml Nml N/A    Right Triceps Nml Nml Nml Nml 1- Mod-R Some 1+ Some 1+ Nml Nml N/A      Waveforms:

## 2014-12-26 ENCOUNTER — Other Ambulatory Visit: Payer: Medicare Other

## 2014-12-28 ENCOUNTER — Ambulatory Visit: Payer: Medicare Other | Admitting: Neurology

## 2014-12-30 ENCOUNTER — Ambulatory Visit (AMBULATORY_SURGERY_CENTER): Payer: Self-pay | Admitting: *Deleted

## 2014-12-30 ENCOUNTER — Telehealth: Payer: Self-pay | Admitting: *Deleted

## 2014-12-30 VITALS — Ht 72.0 in | Wt 269.4 lb

## 2014-12-30 DIAGNOSIS — Z800 Family history of malignant neoplasm of digestive organs: Secondary | ICD-10-CM

## 2014-12-30 MED ORDER — NA SULFATE-K SULFATE-MG SULF 17.5-3.13-1.6 GM/177ML PO SOLN: ORAL | Status: DC

## 2014-12-30 NOTE — Progress Notes (Signed)
No allergies to eggs or soy. No problems with anesthesia.  Pt given Emmi instructions for colonoscopy  No oxygen use  No diet drug use  Pt has insulin pump managed by Dr. Loanne Drilling.  TE sent to Genella Mech to send letter to Dr Loanne Drilling for pump instructions day before and day of procedure.

## 2014-12-30 NOTE — Telephone Encounter (Signed)
[name]: pt is scheduled for recall colonoscopy with Dr. Deatra Ina 01/13/15 at 9:00.  Pt is diabetic with insulin pump followed by Dr Loanne Drilling.  Please send letter to Dr. Loanne Drilling for instructions for pump day before procedure and day of procedure and notify pt with instructions when received from Dr Loanne Drilling.  Thanks, Juliann Pulse

## 2015-01-01 NOTE — Telephone Encounter (Signed)
Letter mailed to home address

## 2015-01-01 NOTE — Telephone Encounter (Signed)
Day prior to procedure:  basal rate of 0.8 units/hr, 24 hrs per day.  mealtime bolus of 1 unit/20 grams carbohydrate.   Day of procedure: Please continue basal rate of 0.5 units/hr, 24 hrs per day.  No mealtime boluses   After procedure, when pt resumes eating: Resume usual pump settings.

## 2015-01-06 ENCOUNTER — Ambulatory Visit
Admission: RE | Admit: 2015-01-06 | Discharge: 2015-01-06 | Disposition: A | Discharge status: Home / Self Care | Payer: Medicare Other | Source: Ambulatory Visit | Attending: Neurology | Admitting: Neurology

## 2015-01-06 DIAGNOSIS — M48062 Spinal stenosis, lumbar region with neurogenic claudication: Secondary | ICD-10-CM

## 2015-01-06 DIAGNOSIS — E1042 Type 1 diabetes mellitus with diabetic polyneuropathy: Secondary | ICD-10-CM

## 2015-01-10 ENCOUNTER — Emergency Department (HOSPITAL_COMMUNITY)
Admission: EM | Admit: 2015-01-10 | Discharge: 2015-01-10 | Disposition: A | Discharge status: Home / Self Care | Payer: Medicare Other | Attending: Emergency Medicine | Admitting: Emergency Medicine

## 2015-01-10 ENCOUNTER — Encounter (HOSPITAL_COMMUNITY): Payer: Self-pay | Admitting: *Deleted

## 2015-01-10 ENCOUNTER — Emergency Department (HOSPITAL_COMMUNITY): Payer: Medicare Other

## 2015-01-10 DIAGNOSIS — M1990 Unspecified osteoarthritis, unspecified site: Secondary | ICD-10-CM | POA: Diagnosis not present

## 2015-01-10 DIAGNOSIS — Z791 Long term (current) use of non-steroidal anti-inflammatories (NSAID): Secondary | ICD-10-CM | POA: Insufficient documentation

## 2015-01-10 DIAGNOSIS — X58XXXA Exposure to other specified factors, initial encounter: Secondary | ICD-10-CM | POA: Diagnosis not present

## 2015-01-10 DIAGNOSIS — Z9981 Dependence on supplemental oxygen: Secondary | ICD-10-CM | POA: Diagnosis not present

## 2015-01-10 DIAGNOSIS — S86911A Strain of unspecified muscle(s) and tendon(s) at lower leg level, right leg, initial encounter: Secondary | ICD-10-CM | POA: Diagnosis not present

## 2015-01-10 DIAGNOSIS — Z794 Long term (current) use of insulin: Secondary | ICD-10-CM | POA: Diagnosis not present

## 2015-01-10 DIAGNOSIS — Z79899 Other long term (current) drug therapy: Secondary | ICD-10-CM | POA: Diagnosis not present

## 2015-01-10 DIAGNOSIS — Z87448 Personal history of other diseases of urinary system: Secondary | ICD-10-CM | POA: Insufficient documentation

## 2015-01-10 DIAGNOSIS — Z7982 Long term (current) use of aspirin: Secondary | ICD-10-CM | POA: Diagnosis not present

## 2015-01-10 DIAGNOSIS — Z8719 Personal history of other diseases of the digestive system: Secondary | ICD-10-CM | POA: Diagnosis not present

## 2015-01-10 DIAGNOSIS — M109 Gout, unspecified: Secondary | ICD-10-CM | POA: Diagnosis not present

## 2015-01-10 DIAGNOSIS — Z86010 Personal history of colonic polyps: Secondary | ICD-10-CM | POA: Insufficient documentation

## 2015-01-10 DIAGNOSIS — Z862 Personal history of diseases of the blood and blood-forming organs and certain disorders involving the immune mechanism: Secondary | ICD-10-CM | POA: Diagnosis not present

## 2015-01-10 DIAGNOSIS — I10 Essential (primary) hypertension: Secondary | ICD-10-CM | POA: Insufficient documentation

## 2015-01-10 DIAGNOSIS — S8391XA Sprain of unspecified site of right knee, initial encounter: Secondary | ICD-10-CM | POA: Diagnosis not present

## 2015-01-10 DIAGNOSIS — Y9389 Activity, other specified: Secondary | ICD-10-CM | POA: Diagnosis not present

## 2015-01-10 DIAGNOSIS — M25561 Pain in right knee: Secondary | ICD-10-CM

## 2015-01-10 DIAGNOSIS — Z8669 Personal history of other diseases of the nervous system and sense organs: Secondary | ICD-10-CM | POA: Insufficient documentation

## 2015-01-10 DIAGNOSIS — Y9289 Other specified places as the place of occurrence of the external cause: Secondary | ICD-10-CM | POA: Insufficient documentation

## 2015-01-10 DIAGNOSIS — Z87891 Personal history of nicotine dependence: Secondary | ICD-10-CM | POA: Insufficient documentation

## 2015-01-10 DIAGNOSIS — S8991XA Unspecified injury of right lower leg, initial encounter: Secondary | ICD-10-CM | POA: Diagnosis present

## 2015-01-10 DIAGNOSIS — Y998 Other external cause status: Secondary | ICD-10-CM | POA: Insufficient documentation

## 2015-01-10 DIAGNOSIS — R011 Cardiac murmur, unspecified: Secondary | ICD-10-CM | POA: Insufficient documentation

## 2015-01-10 DIAGNOSIS — E109 Type 1 diabetes mellitus without complications: Secondary | ICD-10-CM | POA: Diagnosis not present

## 2015-01-10 MED ORDER — IBUPROFEN 800 MG PO TABS
800.0000 mg | ORAL_TABLET | Freq: Once | ORAL | Status: AC
  Administered 2015-01-10: 800 mg via ORAL
  Filled 2015-01-10: qty 1

## 2015-01-10 MED ORDER — IBUPROFEN 600 MG PO TABS: 600.0000 mg | ORAL_TABLET | Freq: Three times a day (TID) | ORAL | Status: DC | PRN

## 2015-01-10 MED ORDER — OXYCODONE-ACETAMINOPHEN 5-325 MG PO TABS: 1.0000 | ORAL_TABLET | ORAL | Status: DC | PRN

## 2015-01-10 NOTE — ED Notes (Signed)
Pt sts he was fishing yesterday and he stepped off the deck and felt his right knee "turned backwards". Pt reports excrutiating pain immediately after and now sts he can hardly bear any weight on right leg. No swelling noted. Pt has hx of DM and is on insulin pump.

## 2015-01-10 NOTE — ED Notes (Signed)
Ortho tech contacted. 

## 2015-01-10 NOTE — ED Provider Notes (Signed)
CSN: [id number]     Arrival date & time 01/10/15  1103 History   First MD Initiated Contact with Patient 01/10/15 1153     Chief Complaint  Patient presents with  . Knee Pain      HPI Patient was stepping into his boat off of a deck yesterday when he felt his right knee buckle.  He felt immediate pain and reports significant ongoing pain in his right knee since then.  He states pain with ambulation.  He denies numbness or tingling.  He denies weakness of his right lower extremity.  He denies hip pain.  His pain is moderate in severity.  He is still able to extend his right leg at the knee.   Past Medical History  Diagnosis Date  . Hepatic steatosis   . Urolithiasis   . Hypogonadism male   . Gynecomastia   . Anal fissure   . DM type 1 with diabetic peripheral neuropathy   . Personal history of colonic polyps   . Anemia, unspecified   . Duodenitis   . Esophageal stricture   . Arthritis     Spinal OA  . DIABETES MELLITUS, TYPE I 05/20/2007  . HYPERLIPIDEMIA 05/20/2007  . 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 Morbid obesity 09/10/2009  . OBSTRUCTIVE SLEEP APNEA 11/04/2007  . DIASTOLIC DYSFUNCTION 0/16/5537  . RENAL INSUFFICIENCY 05/12/2008  . BACK PAIN, LUMBAR 10/23/2007  . CARDIAC MURMUR, SYSTOLIC 01/28/2706  . Edema 05/12/2008  . Sleep apnea     uses cpap  . Hypertension   . Allergy    Past Surgical History  Procedure Laterality Date  . Abdominal US  09/1997  . Rest cardiolite  03/19/2003  . Lower arterial  04/13/2004  . Electrocardiogram  02/2006  . Colonoscopy  08/18/2004    diverticulitis  . Colonoscopy  09/24/2009  . Colon cancer screening    . Flexible sigmoidoscopy  03/04/2001    polyps, anal fissure  . Arm fracture Left 1958    with hardware  . Ganglion cyst excision Left 1980   Family History  Problem Relation Age of Onset  . Colon cancer Brother 13  . Cancer Brother     lung cancer, deceased  . Healthy Daughter   . Healthy Son   . Other Mother     MVA, deceased 81s    History  Substance Use Topics  . Smoking status: Former Smoker -- 2.00 packs/day for 31 years    Types: Cigarettes    Quit date: 10/23/1982  . Smokeless tobacco: Never Used  . Alcohol Use: No    Review of Systems  All other systems reviewed and are negative.     Allergies  Atorvastatin; Niacin; and Pioglitazone  Home Medications   Prior to Admission medications   Medication Sig Start Date End Date Taking? Authorizing Provider  alfuzosin (UROXATRAL) 10 MG 24 hr tablet Take 1 tablet by mouth  daily with breakfast 11/24/14  Yes Renato Shin, MD  allopurinol (ZYLOPRIM) 300 MG tablet Take 1 tablet by mouth  daily 11/24/14  Yes Renato Shin, MD  aspirin (BAYER LOW STRENGTH) 81 MG EC tablet Take 81 mg by mouth daily.     Yes Historical Provider, MD  carvedilol (COREG) 3.125 MG tablet Take 1 tablet (3.125 mg total) by mouth 2 (two) times daily with a meal. 09/07/14  Yes Renato Shin, MD  celecoxib (CELEBREX) 200 MG capsule Take 1 capsule by mouth  daily Patient taking differently: Take 200 mg by mouth daily. Take 1 capsule  by mouth  daily 11/10/14  Yes Renato Shin, MD  CRESTOR 40 MG tablet Take 1 tablet by mouth at  bedtime 11/24/14  Yes Renato Shin, MD  cycloSPORINE (RESTASIS) 0.05 % ophthalmic emulsion Place 1 drop into both eyes 2 (two) times daily.     Yes Historical Provider, MD  fluticasone (FLONASE) 50 MCG/ACT nasal spray Place 1 spray into the nose daily as needed for allergies.    Yes Historical Provider, MD  folic acid (FOLVITE) 1 MG tablet Take 2 mg by mouth 2 (two) times daily. 4 tabs daily   Yes Historical Provider, MD  furosemide (LASIX) 20 MG tablet Take 1 tablet by mouth  daily 10/27/14  Yes Renato Shin, MD  insulin lispro (HUMALOG) 100 UNIT/ML injection Use for pump 65 units per day. Patient taking differently: 65 Units by Continuous infusion (non-IV) route daily. Use for pump 65 units per day. 07/30/14  Yes Renato Shin, MD  Multiple Vitamins-Minerals (CENTRUM SILVER PO)  Take 1 tablet by mouth daily.     Yes Historical Provider, MD  Omega-3 Fatty Acids (FISH OIL) 1200 MG CAPS Take 1,200 mg by mouth daily.    Yes Historical Provider, MD  ondansetron (ZOFRAN ODT) 4 MG disintegrating tablet 36m ODT q4 hours prn nausea/vomit Patient taking differently: Take 4 mg by mouth every 4 (four) hours as needed for nausea. 468mODT q4 hours prn nausea/vomit 11/28/14  Yes FoPamella PertMD  vitamin B-12 (CYANOCOBALAMIN) 1000 MCG tablet Take 1,000 mcg by mouth daily.   Yes Historical Provider, MD  ACCU-CHEK FASTCLIX LANCETS MISC 1 Device by Does not apply route 4 (four) times daily. 03/27/14   SeRenato ShinMD  Blood Glucose Monitoring Suppl (ACCU-CHEK AVIVA PLUS) W/DEVICE KIT Use as directed 07/29/14   SeRenato ShinMD  glucose blood (ACCU-CHEK AVIVA) test strip 1 each by Other route 2 (two) times daily. And lancets 2/day 250.01 Patient not taking: Reported on 01/10/2015 02/04/14   SeRenato ShinMD  HUMALOG 100 UNIT/ML injection Inject subcutaneously for  pump 65 units per day Patient not taking: Reported on 11/28/2014 10/27/14   SeRenato ShinMD  ibuprofen (ADVIL,MOTRIN) 600 MG tablet Take 1 tablet (600 mg total) by mouth every 8 (eight) hours as needed. 01/10/15   KeJola SchmidtMD  Insulin Infusion Pump Supplies (ACCU-CHEK PLASTIC CARTRIDGE) MISC by Continuous infusion (non-IV) route continuous.  10/05/14   Historical Provider, MD  Na Sulfate-K Sulfate-Mg Sulf (SUPREP BOWEL PREP) SOLN suprep as directed.  No substitutions 12/30/14   RoInda CastleMD  omeprazole (PRILOSEC) 20 MG capsule Take 1 capsule by mouth  daily as needed Patient not taking: Reported on 12/30/2014 07/29/14   SeRenato ShinMD  oxyCODONE-acetaminophen (PERCOCET/ROXICET) 5-325 MG per tablet Take 1 tablet by mouth every 4 (four) hours as needed for severe pain. 01/10/15   KeJola SchmidtMD   BP 133/50 mmHg  Pulse 57  Temp(Src) 97.5 F (36.4 C) (Oral)  Resp 18  SpO2 99% Physical Exam  Constitutional: He is oriented to  person, place, and time. He appears well-developed and well-nourished.  HENT:  Head: Normocephalic.  Eyes: EOM are normal.  Neck: Normal range of motion.  Pulmonary/Chest: Effort normal.  Abdominal: He exhibits no distension.  Musculoskeletal: Normal range of motion.  Normal pulses in right foot.  Able to range her right knee with some pain only at flexion past 45.  Patient able to extend the leg at the knee, thus extensor mechanism intact.  Neurological: He is alert and  oriented to person, place, and time.  Psychiatric: He has a normal mood and affect.  Nursing note and vitals reviewed.   ED Course  Procedures (including critical care time) Labs Review Labs Reviewed - No data to display  Imaging Review Dg Knee Complete 4 Views Right  01/10/2015   CLINICAL DATA:  Right knee pain following twisting injury, initial encounter  EXAM: RIGHT KNEE - COMPLETE 4+ VIEW  COMPARISON:  11/15/2011  FINDINGS: No acute fracture or dislocation is noted. No joint effusion is seen. No gross soft tissue abnormality is noted.  IMPRESSION: No acute abnormality noted.   Electronically Signed   By: Inez Catalina M.D.   On: 01/10/2015 11:55  I personally reviewed the imaging tests through PACS system I reviewed available ER/hospitalization records through the EMR    EKG Interpretation None      MDM   Final diagnoses:  Right knee pain  Knee sprain and strain, right, initial encounter    Normal pulses.  X-ray negative.  Able to extend at the right knee.  Doubt quad tendon injury.  Suspect internal derangement.  Home with knee immobilizer and crutches.  Orthopedic follow-up.  Patient understands to return to the ER for new or worsening symptoms.    Jola Schmidt, MD 01/10/15 [phone]

## 2015-01-10 NOTE — Discharge Instructions (Signed)

## 2015-01-11 ENCOUNTER — Telehealth: Payer: Self-pay | Admitting: Gastroenterology

## 2015-01-11 NOTE — Telephone Encounter (Signed)
OK 

## 2015-01-13 ENCOUNTER — Encounter: Payer: Medicare Other | Admitting: Gastroenterology

## 2015-01-19 ENCOUNTER — Ambulatory Visit: Payer: Medicare Other | Admitting: Neurology

## 2015-01-26 ENCOUNTER — Encounter: Payer: Self-pay | Admitting: Neurology

## 2015-01-26 ENCOUNTER — Ambulatory Visit (INDEPENDENT_AMBULATORY_CARE_PROVIDER_SITE_OTHER): Payer: Medicare Other | Admitting: Neurology

## 2015-01-26 VITALS — BP 122/70 | HR 76 | Ht 72.0 in | Wt 266.0 lb

## 2015-01-26 DIAGNOSIS — M5412 Radiculopathy, cervical region: Secondary | ICD-10-CM

## 2015-01-26 DIAGNOSIS — E1042 Type 1 diabetes mellitus with diabetic polyneuropathy: Secondary | ICD-10-CM | POA: Diagnosis not present

## 2015-01-26 NOTE — Progress Notes (Signed)
Follow-up Visit   Date: 01/26/2015    [name] MRN: [id number] DOB: 03-02-1941   Interim History: [name] is a 74 y.o. right-handed Caucasian male with -dependent type I diabetes (diagnosed 1986, HbA1c 6.3), morbid obesity, OSA, hyperlipidemia, and GERD returning to the clinic for follow-up of paresthesias of the hands and feet.  The patient was accompanied to the clinic by self.  History of present illness: Starting around early 2000s, he developing tingling of the toes and later involved his fingers. He has tingling involving entire legs worse in the feet (left > right) and tingling over the shoulders and hands. Symptoms are constant. He has noticed mild difficulty with fine finger movements and has greater problem getting up from a low position. No falls or imbalance.   He also endorses low back pain with prolonged walking and has to take breaks every 0.25 miles. Once he rests, his pain is relieved. He prefers to use the shopping cart for comfort when at the grocery store.   He had skin biopsy performed by podiatry in 06/2014 which returned normal. His HbA1c was ranging 7-9.0 and only recently after having insulin pump, have his sugars been under much better control, last HbA1c 6.3.  UPDATE 01/26/2015:  He reports injuring his knee two weeks ago and had to walk on crutches for the first few days.  He is being evaluated by Dr. Erlinda Hong in orthopeadics who said it was a severe sprain.  There has been no change in the numbness of his fingers. Tingling is not bothersome enough to start medications.  He continues to tire with prolonged walking.  No new complaints.     Medications:  Current Outpatient Prescriptions on File Prior to Visit  Medication Sig Dispense Refill  . alfuzosin (UROXATRAL) 10 MG 24 hr tablet Take 1 tablet by mouth  daily with breakfast 90 tablet 1  . allopurinol (ZYLOPRIM) 300 MG tablet Take 1 tablet by mouth  daily 90 tablet 1  . aspirin (BAYER LOW STRENGTH) 81  MG EC tablet Take 81 mg by mouth daily.      . carvedilol (COREG) 3.125 MG tablet Take 1 tablet (3.125 mg total) by mouth 2 (two) times daily with a meal. 180 tablet 3  . celecoxib (CELEBREX) 200 MG capsule Take 1 capsule by mouth  daily (Patient taking differently: Take 200 mg by mouth daily. Take 1 capsule by mouth  daily) 90 capsule 1  . CRESTOR 40 MG tablet Take 1 tablet by mouth at  bedtime 90 tablet 1  . cycloSPORINE (RESTASIS) 0.05 % ophthalmic emulsion Place 1 drop into both eyes 2 (two) times daily.      . fluticasone (FLONASE) 50 MCG/ACT nasal spray Place 1 spray into the nose daily as needed for allergies.     . folic acid (FOLVITE) 1 MG tablet Take 2 mg by mouth 2 (two) times daily. 4 tabs daily    . furosemide (LASIX) 20 MG tablet Take 1 tablet by mouth  daily 90 tablet 3  . HUMALOG 100 UNIT/ML injection Inject subcutaneously for  pump 65 units per day 60 mL 1  . ibuprofen (ADVIL,MOTRIN) 600 MG tablet Take 1 tablet (600 mg total) by mouth every 8 (eight) hours as needed. 15 tablet 0  . Insulin Infusion Pump Supplies (ACCU-CHEK PLASTIC CARTRIDGE) MISC by Continuous infusion (non-IV) route continuous.     . insulin lispro (HUMALOG) 100 UNIT/ML injection Use for pump 65 units per day. (Patient taking differently: 65 Units  by Continuous infusion (non-IV) route daily. Use for pump 65 units per day.) 2 vial 2  . Multiple Vitamins-Minerals (CENTRUM SILVER PO) Take 1 tablet by mouth daily.      . Omega-3 Fatty Acids (FISH OIL) 1200 MG CAPS Take 1,200 mg by mouth daily.     Marland Kitchen omeprazole (PRILOSEC) 20 MG capsule Take 1 capsule by mouth  daily as needed 90 capsule 1  . vitamin B-12 (CYANOCOBALAMIN) 1000 MCG tablet Take 1,000 mcg by mouth daily.    Marland Kitchen ACCU-CHEK FASTCLIX LANCETS MISC 1 Device by Does not apply route 4 (four) times daily. (Patient not taking: Reported on 01/26/2015) 360 each 3  . Blood Glucose Monitoring Suppl (ACCU-CHEK AVIVA PLUS) W/DEVICE KIT Use as directed (Patient not taking:  Reported on 01/26/2015) 1 kit 0  . glucose blood (ACCU-CHEK AVIVA) test strip 1 each by Other route 2 (two) times daily. And lancets 2/day 250.01 (Patient not taking: Reported on 01/10/2015) 100 each 12   No current facility-administered medications on file prior to visit.    Allergies:  Allergies  Allergen Reactions  . Atorvastatin Other (See Comments)    unknown  . Niacin     REACTION: Severe heartburn  . Pioglitazone     REACTION: Edema    Review of Systems:  CONSTITUTIONAL: No fevers, chills, night sweats, or weight loss.  EYES: No visual changes or eye pain ENT: No hearing changes.  No history of nose bleeds.   RESPIRATORY: No cough, wheezing and shortness of breath.   CARDIOVASCULAR: Negative for chest pain, and palpitations.   GI: Negative for abdominal discomfort, blood in stools or black stools.  No recent change in bowel habits.   GU:  No history of incontinence.   MUSCLOSKELETAL: No history of joint pain or swelling.  No myalgias.   SKIN: Negative for lesions, rash, and itching.   ENDOCRINE: Negative for cold or heat intolerance, polydipsia or goiter.   PSYCH:  No depression or anxiety symptoms.   NEURO: As Above.   Vital Signs:  BP 122/70 mmHg  Pulse 76  Ht 6' (1.829 m)  Wt 266 lb (120.657 kg)  BMI 36.07 kg/m2  Neurological Exam: MENTAL STATUS including orientation to time, place, person, recent and remote memory, attention span and concentration, language, and fund of knowledge is normal.  Speech is not dysarthric.  CRANIAL NERVES:  Pupils equal round and reactive to light.  Normal conjugate, extra-ocular eye movements in all directions of gaze.  No ptosis.  Face is symmetric. Palate elevates symmetrically.  Tongue is midline.  MOTOR:  Motor strength is 5/5 in all extremities, mild intrinsic hand atropy. Tone is normal.    MSRs:  Reflexes are 2+/4 throughout, except absent at Achilles bilaterally.  SENSORY:  Vibration reduced to 80% at ankles and absent at  toes bilaterally  COORDINATION/GAIT:  Gait narrow based and stable.   Data: EMG left upper and lower extremity 12/10/2014: 1. 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 2. 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  MRI cervical spine wo contrast 01/06/2015: Multilevel spondylosis as described. No dominant area of compression, but mild BILATERAL multilevel foraminal narrowing from C4 through C7 could affect the exiting nerve roots at those levels.  MRI lumbar spine 11/06/2014: Shallow disc bulge, ligamentum flavum thickening and a tiny synovial cyst off the medial aspect of the left facet joint  at L4-5 cause mild central canal narrowing without nerve root compression. The foramina are open.  Shallow disc bulge at L5-S1 without central canal or foraminal stenosis.  Skin biopsy 07/13/2014:  Normal    IMPRESSION/PLAN: 1. 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  There is no evidence of neuropathy affecting the hands, but he does have diabetic neuropathy of the lower extremities. Offered to start gabapentin for parestheesias, but patient deferred at this time stating symptoms no severe enough to start a medication Praised him for doing well maintaining tight glycemic control  2. Proximal arm paresthesias most likely due cervical radiculopathy  We discussed start neck physiotherapy and he would like to think about this.  He will contact my office when ready to start.  At this time, his primary issue is right knee pain and is focusing on trying to get that better.  3.  Early fatigue with prolonged walking, less likely neurogenic claudication as MRI lumbar spine does not show severe canal stenosis.   May need to investigate symptoms for vascular causes, if this persists  Return to clinic as needed    The duration of this appointment visit was 25 minutes of face-to-face time with the patient.  Greater than 50% of this time was spent in counseling, explanation of diagnosis, planning of further management, and coordination of care.   Thank you for allowing me to participate in patient's care.  If I can answer any additional questions, I would be pleased to do so.    Sincerely,    Donika K. Posey Pronto, DO

## 2015-01-26 NOTE — Patient Instructions (Addendum)
It was great to see you today! When you are ready to start physical therapy, please call my office.   If your symptoms worsen, you are welcome come back and see me.

## 2015-02-23 ENCOUNTER — Encounter: Payer: Self-pay | Admitting: Endocrinology

## 2015-02-23 ENCOUNTER — Ambulatory Visit (INDEPENDENT_AMBULATORY_CARE_PROVIDER_SITE_OTHER): Payer: Medicare Other | Admitting: Endocrinology

## 2015-02-23 VITALS — BP 134/84 | HR 74 | Temp 98.2°F | Ht 72.0 in | Wt 265.0 lb

## 2015-02-23 DIAGNOSIS — D61818 Other pancytopenia: Secondary | ICD-10-CM | POA: Diagnosis not present

## 2015-02-23 DIAGNOSIS — N202 Calculus of kidney with calculus of ureter: Secondary | ICD-10-CM

## 2015-02-23 DIAGNOSIS — N189 Chronic kidney disease, unspecified: Secondary | ICD-10-CM | POA: Diagnosis not present

## 2015-02-23 DIAGNOSIS — N209 Urinary calculus, unspecified: Secondary | ICD-10-CM | POA: Insufficient documentation

## 2015-02-23 DIAGNOSIS — E1022 Type 1 diabetes mellitus with diabetic chronic kidney disease: Secondary | ICD-10-CM | POA: Diagnosis not present

## 2015-02-23 LAB — BASIC METABOLIC PANEL
BUN: 28 mg/dL — ABNORMAL HIGH (ref 6–23)
CALCIUM: 9.5 mg/dL (ref 8.4–10.5)
CO2: 29 meq/L (ref 19–32)
Chloride: 103 mEq/L (ref 96–112)
Creatinine, Ser: 1.47 mg/dL (ref 0.40–1.50)
GFR: 49.86 mL/min — ABNORMAL LOW (ref >60.00)
Glucose, Bld: 115 mg/dL — ABNORMAL HIGH (ref 70–99)
POTASSIUM: 3.9 meq/L (ref 3.5–5.1)
SODIUM: 140 meq/L (ref 135–145)

## 2015-02-23 LAB — CBC WITH DIFFERENTIAL/PLATELET
BASOS PCT: 0.2 % (ref 0.0–3.0)
Basophils Absolute: 0 10*3/uL (ref 0.0–0.1)
EOS ABS: 0.1 10*3/uL (ref 0.0–0.7)
EOS PCT: 1.1 % (ref 0.0–5.0)
HEMATOCRIT: 44.6 % (ref 39.0–52.0)
Hemoglobin: 15.2 g/dL (ref 13.0–17.0)
LYMPHS ABS: 2 10*3/uL (ref 0.7–4.0)
Lymphocytes Relative: 35.7 % (ref 12.0–46.0)
MCHC: 34.2 g/dL (ref 30.0–36.0)
MCV: 89.7 fl (ref 78.0–100.0)
Monocytes Absolute: 0.3 10*3/uL (ref 0.1–1.0)
Monocytes Relative: 6 % (ref 3.0–12.0)
NEUTROS PCT: 57 % (ref 43.0–77.0)
Neutro Abs: 3.2 10*3/uL (ref 1.4–7.7)
Platelets: 105 10*3/uL — ABNORMAL LOW (ref 150.0–400.0)
RBC: 4.97 Mil/uL (ref 4.22–5.81)
RDW: 14.9 % (ref 11.5–15.5)
WBC: 5.6 10*3/uL (ref 4.0–10.5)

## 2015-02-23 LAB — HEMOGLOBIN A1C: HEMOGLOBIN A1C: 6.5 % (ref 4.6–6.5)

## 2015-02-23 NOTE — Patient Instructions (Addendum)
Please continue basal rate to 1.6 units/hr, 24 hrs per day.   continue the mealtime bolus of 1 unit/8 grams carbohydrate.  continue correction bolus (which some people call "sensitivity," or "insulin sensitivity ratio," or just "isr") of 1 unit for each 10 by which your glucose exceeds 100. Please come back for a follow-up appointment in 3 months.  blood tests are being requested for you today.  We'll let you know about the results. check your blood sugar 6 times a day.  vary the time of day when you check, between before the 3 meals, and at bedtime.  also check if you have symptoms of your blood sugar being too high or too low.  please keep a record of the readings and bring it to your next appointment here.  You can write it on any piece of paper.  please call us sooner if your blood sugar goes below 70, or if you have a lot of readings over 200. blood tests are being requested for you today.  We'll contact you with results.  We'll recheck the CT scan early next year.

## 2015-02-23 NOTE — Progress Notes (Signed)
Subjective:    Patient ID: [name], male    DOB: [birth year], 74 y.o.   MRN: [id number]  HPI Pt returns for f/u of diabetes mellitus: DM type: Insulin-requiring type 2 Dx'ed: 4098 Complications: polyneuropathy and renal insufficiency. Therapy: insulin since 1995 DKA: never Severe hypoglycemia: never. Pancreatitis: never Other: he started pump therapy in mid-2015 Interval history: He is still considering weight-loss surgery.   He takes these pump settings: basal rate of 1.6 units/hr, 24 hrs/day mealtime bolus of 1 unit/8 grams carbohydrate correction bolus (which some people call "sensitivity," or "insulin sensitivity ratio," or just "isr") of 1 unit for each 10 by which your glucose exceeds 100. He averages approx 65 units total, per day.  The plan is to start a continuous glucose monitor soon.  he brings a record of his cbg's which i have reviewed today, which will be scanned into epic. it is in general higher as the day goes on.   pt states he feels well in general.    Past Medical History  Diagnosis Date  . Hepatic steatosis   . Urolithiasis   . Hypogonadism male   . Gynecomastia   . Anal fissure   . DM type 1 with diabetic peripheral neuropathy   . Personal history of colonic polyps   . Anemia, unspecified   . Duodenitis   . Esophageal stricture   . Arthritis     Spinal OA  . DIABETES MELLITUS, TYPE I 05/20/2007  . HYPERLIPIDEMIA 05/20/2007  . 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 Morbid obesity 09/10/2009  . OBSTRUCTIVE SLEEP APNEA 11/04/2007  . DIASTOLIC DYSFUNCTION 11/10/1476  . RENAL INSUFFICIENCY 05/12/2008  . BACK PAIN, LUMBAR 10/23/2007  . CARDIAC MURMUR, SYSTOLIC 12/02/5619  . Edema 05/12/2008  . Sleep apnea     uses cpap  . Hypertension   . Allergy     Past Surgical History  Procedure Laterality Date  . Abdominal US  09/1997  . Rest cardiolite  03/19/2003  . Lower arterial  04/13/2004  . Electrocardiogram  02/2006  . Colonoscopy  08/18/2004    diverticulitis  .  Colonoscopy  09/24/2009  . Colon cancer screening    . Flexible sigmoidoscopy  03/04/2001    polyps, anal fissure  . Arm fracture Left 1958    with hardware  . Ganglion cyst excision Left 1980    History   Social History  . Marital Status: Married    Spouse Name: N/A  . Number of Children: N/A  . Years of Education: N/A   Occupational History  .     Social History Main Topics  . Smoking status: Former Smoker -- 2.00 packs/day for 31 years    Types: Cigarettes    Quit date: 10/23/1982  . Smokeless tobacco: Never Used  . Alcohol Use: No  . Drug Use: No  . Sexual Activity: Not on file   Other Topics Concern  . Not on file   Social History Narrative   Lives with wife in a one story home.  Has a son and a daughter.     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     Education: 2 years of college.       Current Outpatient Prescriptions on File Prior to Visit  Medication Sig Dispense Refill  . ACCU-CHEK FASTCLIX LANCETS MISC 1 Device by Does not apply route 4 (four) times daily. 360 each 3  . alfuzosin (UROXATRAL) 10 MG 24 hr tablet Take 1 tablet by mouth  daily  with breakfast 90 tablet 1  . allopurinol (ZYLOPRIM) 300 MG tablet Take 1 tablet by mouth  daily 90 tablet 1  . aspirin (BAYER LOW STRENGTH) 81 MG EC tablet Take 81 mg by mouth daily.      . Blood Glucose Monitoring Suppl (ACCU-CHEK AVIVA PLUS) W/DEVICE KIT Use as directed 1 kit 0  . carvedilol (COREG) 3.125 MG tablet Take 1 tablet (3.125 mg total) by mouth 2 (two) times daily with a meal. 180 tablet 3  . celecoxib (CELEBREX) 200 MG capsule Take 1 capsule by mouth  daily (Patient taking differently: Take 200 mg by mouth daily. Take 1 capsule by mouth  daily) 90 capsule 1  . CRESTOR 40 MG tablet Take 1 tablet by mouth at  bedtime 90 tablet 1  . cycloSPORINE (RESTASIS) 0.05 % ophthalmic emulsion Place 1 drop into both eyes 2 (two) times daily.      . fluticasone (FLONASE) 50 MCG/ACT nasal spray Place 1  spray into the nose daily as needed for allergies.     . folic acid (FOLVITE) 1 MG tablet Take 2 mg by mouth 2 (two) times daily. 4 tabs daily    . furosemide (LASIX) 20 MG tablet Take 1 tablet by mouth  daily 90 tablet 3  . glucose blood (ACCU-CHEK AVIVA) test strip 1 each by Other route 2 (two) times daily. And lancets 2/day 250.01 100 each 12  . HUMALOG 100 UNIT/ML injection Inject subcutaneously for  pump 65 units per day 60 mL 1  . ibuprofen (ADVIL,MOTRIN) 600 MG tablet Take 1 tablet (600 mg total) by mouth every 8 (eight) hours as needed. 15 tablet 0  . Insulin Infusion Pump Supplies (ACCU-CHEK PLASTIC CARTRIDGE) MISC by Continuous infusion (non-IV) route continuous.     . insulin lispro (HUMALOG) 100 UNIT/ML injection Use for pump 65 units per day. (Patient taking differently: 65 Units by Continuous infusion (non-IV) route daily. Use for pump 65 units per day.) 2 vial 2  . Multiple Vitamins-Minerals (CENTRUM SILVER PO) Take 1 tablet by mouth daily.      . Omega-3 Fatty Acids (FISH OIL) 1200 MG CAPS Take 1,200 mg by mouth daily.     Marland Kitchen omeprazole (PRILOSEC) 20 MG capsule Take 1 capsule by mouth  daily as needed 90 capsule 1  . vitamin B-12 (CYANOCOBALAMIN) 1000 MCG tablet Take 1,000 mcg by mouth daily.     No current facility-administered medications on file prior to visit.    Allergies  Allergen Reactions  . Atorvastatin Other (See Comments)    unknown  . Niacin     REACTION: Severe heartburn  . Pioglitazone     REACTION: Edema    Family History  Problem Relation Age of Onset  . Colon cancer Brother 32  . Cancer Brother     lung cancer, deceased  . Healthy Daughter   . Healthy Son   . Other Mother     MVA, deceased 78s    BP 134/84 mmHg  Pulse 74  Temp(Src) 98.2 F (36.8 C) (Oral)  Ht 6' (1.829 m)  Wt 265 lb (120.203 kg)  BMI 35.93 kg/m2  SpO2 98%    Review of Systems Denies hematuria, weight change, BRBPR, and dysuria.    Objective:   Physical Exam VITAL  SIGNS:  See vs page GENERAL: no distress Pulses: dorsalis pedis intact bilat.   MSK: no deformity of the feet CV: no leg edema.  Skin:  no ulcer on the feet.  normal color and  temp on the feet.  Neuro: sensation is intact to touch on the feet, but decreased from normal.     Lab Results  Component Value Date   HGBA1C 6.5 02/23/2015   Lab Results  Component Value Date   CREATININE 1.47 02/23/2015   BUN 28* 02/23/2015   NA 140 02/23/2015   K 3.9 02/23/2015   CL 103 02/23/2015   CO2 29 02/23/2015   Lab Results  Component Value Date   WBC 5.6 02/23/2015   HGB 15.2 02/23/2015   HCT 44.6 02/23/2015   MCV 89.7 02/23/2015   PLT 105.0* 02/23/2015  radiol: i reviewed CT result    Assessment & Plan:  DM: well-controlled.  Please continue the same pump settings Renal insuff: stable.  We'll follow Thrombocytopenia: slightly worse: we'll follow Abnormal CT.  Patient is advised the following: Patient Instructions  Please continue basal rate to 1.6 units/hr, 24 hrs per day.   continue the mealtime bolus of 1 unit/8 grams carbohydrate.  continue correction bolus (which some people call "sensitivity," or "insulin sensitivity ratio," or just "isr") of 1 unit for each 10 by which your glucose exceeds 100. Please come back for a follow-up appointment in 3 months.  blood tests are being requested for you today.  We'll let you know about the results. check your blood sugar 6 times a day.  vary the time of day when you check, between before the 3 meals, and at bedtime.  also check if you have symptoms of your blood sugar being too high or too low.  please keep a record of the readings and bring it to your next appointment here.  You can write it on any piece of paper.  please call us sooner if your blood sugar goes below 70, or if you have a lot of readings over 200. blood tests are being requested for you today.  We'll contact you with results.  We'll recheck the CT scan early next year.

## 2015-03-02 ENCOUNTER — Other Ambulatory Visit: Payer: Self-pay | Admitting: Endocrinology

## 2015-03-03 ENCOUNTER — Other Ambulatory Visit: Payer: Self-pay

## 2015-03-03 MED ORDER — INSULIN LISPRO 100 UNIT/ML ~~LOC~~ SOLN
SUBCUTANEOUS | Status: DC
Start: 2015-03-03 — End: 2015-04-21

## 2015-03-05 ENCOUNTER — Other Ambulatory Visit: Payer: Self-pay | Admitting: Endocrinology

## 2015-04-07 ENCOUNTER — Other Ambulatory Visit: Payer: Self-pay | Admitting: Endocrinology

## 2015-04-21 ENCOUNTER — Ambulatory Visit (AMBULATORY_SURGERY_CENTER): Payer: Self-pay | Admitting: *Deleted

## 2015-04-21 ENCOUNTER — Telehealth: Payer: Self-pay | Admitting: *Deleted

## 2015-04-21 VITALS — Ht 72.0 in | Wt 264.6 lb

## 2015-04-21 DIAGNOSIS — Z800 Family history of malignant neoplasm of digestive organs: Secondary | ICD-10-CM

## 2015-04-21 MED ORDER — NA SULFATE-K SULFATE-MG SULF 17.5-3.13-1.6 GM/177ML PO SOLN: 1.0000 | Freq: Once | ORAL | Status: DC

## 2015-04-21 NOTE — Telephone Encounter (Signed)
Pt has an insulin pump and has a colon scheduled for Wednesday 05-05-2015 with dr Deatra Ina.Orinda Kenner to [facility] LPN as per protocol for instructions to patient from his PCP Dr Loanne Drilling.   Pt aware he will receive a letter or call with instructions for this   Thanks, marie PV

## 2015-04-21 NOTE — Telephone Encounter (Signed)
When you go on clear liq, reduce insulin (basal, bolus, and correction) by half.  Call if any question.

## 2015-04-21 NOTE — Telephone Encounter (Signed)
Patient advised of MD's instructions listed below and voiced understanding. Pt will call if he has any questions.

## 2015-04-21 NOTE — Telephone Encounter (Signed)
Dr Loanne Drilling,  This patient is scheduled for a colonoscopy on 05/05/15 with Dr Deatra Ina. He will begin clear liquids on 05/04/15 until 7:30 am on 05/05/15. He will be NPO after 7:30 am on 05/05/15. Please advise on his insulin pump dosing.

## 2015-04-21 NOTE — Progress Notes (Signed)
No egg or soy allergy No issues with past sedation No home 02 use but uses cpap No diet pills Pt has an insulin pump. TE to [facility] LPN as per protocol for instructions to patient. Pt aware he will receive a letter or call with instructions for this after she contacts dr Loanne Drilling

## 2015-05-05 ENCOUNTER — Ambulatory Visit (AMBULATORY_SURGERY_CENTER): Payer: Medicare Other | Admitting: Gastroenterology

## 2015-05-05 ENCOUNTER — Encounter: Payer: Self-pay | Admitting: Gastroenterology

## 2015-05-05 VITALS — BP 118/64 | HR 64 | Temp 97.4°F | Resp 22 | Ht 72.0 in | Wt 264.0 lb

## 2015-05-05 DIAGNOSIS — D123 Benign neoplasm of transverse colon: Secondary | ICD-10-CM | POA: Diagnosis not present

## 2015-05-05 DIAGNOSIS — Z1211 Encounter for screening for malignant neoplasm of colon: Secondary | ICD-10-CM

## 2015-05-05 DIAGNOSIS — Z800 Family history of malignant neoplasm of digestive organs: Secondary | ICD-10-CM | POA: Diagnosis not present

## 2015-05-05 DIAGNOSIS — D124 Benign neoplasm of descending colon: Secondary | ICD-10-CM

## 2015-05-05 DIAGNOSIS — K5730 Diverticulosis of large intestine without perforation or abscess without bleeding: Secondary | ICD-10-CM

## 2015-05-05 DIAGNOSIS — K648 Other hemorrhoids: Secondary | ICD-10-CM

## 2015-05-05 LAB — GLUCOSE, CAPILLARY
GLUCOSE-CAPILLARY: 108 mg/dL — AB (ref 65–99)
Glucose-Capillary: 113 mg/dL — ABNORMAL HIGH (ref 65–99)

## 2015-05-05 MED ORDER — SODIUM CHLORIDE 0.9 % IV SOLN: 500.0000 mL | INTRAVENOUS | Status: DC

## 2015-05-05 NOTE — Progress Notes (Signed)
Called to room to assist during endoscopic procedure.  Patient ID and intended procedure confirmed with present staff. Received instructions for my participation in the procedure from the performing physician.  

## 2015-05-05 NOTE — Patient Instructions (Signed)
Please see Procedure report for findings and recommendations  YOU HAD AN ENDOSCOPIC PROCEDURE TODAY AT THE [facility]:   Refer to the procedure report that was given to you for any specific questions about what was found during the examination.  If the procedure report does not answer your questions, please call your gastroenterologist to clarify.  If you requested that your care partner not be given the details of your procedure findings, then the procedure report has been included in a sealed envelope for you to review at your convenience later.  YOU SHOULD EXPECT: Some feelings of bloating in the abdomen. Passage of more gas than usual.  Walking can help get rid of the air that was put into your GI tract during the procedure and reduce the bloating. If you had a lower endoscopy (such as a colonoscopy or flexible sigmoidoscopy) you may notice spotting of blood in your stool or on the toilet paper. If you underwent a bowel prep for your procedure, you may not have a normal bowel movement for a few days.  Please Note:  You might notice some irritation and congestion in your nose or some drainage.  This is from the oxygen used during your procedure.  There is no need for concern and it should clear up in a day or so.  SYMPTOMS TO REPORT IMMEDIATELY:   Following lower endoscopy (colonoscopy or flexible sigmoidoscopy):  Excessive amounts of blood in the stool  Significant tenderness or worsening of abdominal pains  Swelling of the abdomen that is new, acute  Fever of 100F or higher   Following upper endoscopy (EGD)  Vomiting of blood or coffee ground material  New chest pain or pain under the shoulder blades  Painful or persistently difficult swallowing  New shortness of breath  Fever of 100F or higher  Black, tarry-looking stools  For urgent or emergent issues, a gastroenterologist can be reached at any hour by calling [phone].   DIET: Your first meal following the  procedure should be a small meal and then it is ok to progress to your normal diet. Heavy or fried foods are harder to digest and may make you feel nauseous or bloated.  Likewise, meals heavy in dairy and vegetables can increase bloating.  Drink plenty of fluids but you should avoid alcoholic beverages for 24 hours.  ACTIVITY:  You should plan to take it easy for the rest of today and you should NOT DRIVE or use heavy machinery until tomorrow (because of the sedation medicines used during the test).    FOLLOW UP: Our staff will call the number listed on your records the next business day following your procedure to check on you and address any questions or concerns that you may have regarding the information given to you following your procedure. If we do not reach you, we will leave a message.  However, if you are feeling well and you are not experiencing any problems, there is no need to return our call.  We will assume that you have returned to your regular daily activities without incident.  If any biopsies were taken you will be contacted by phone or by letter within the next 1-3 weeks.  Please call us at [phone] if you have not heard about the biopsies in 3 weeks.    SIGNATURES/CONFIDENTIALITY: You and/or your care partner have signed paperwork which will be entered into your electronic medical record.  These signatures attest to the fact that that the information   above on your After Visit Summary has been reviewed and is understood.  Full responsibility of the confidentiality of this discharge information lies with you and/or your care-partner.  Please follow all discharge instructions given to you by the recovery room nurse. If you have any questions or problems after discharge please call one of the numbers listed above. You will receive a phone call in the am to see how you are doing and answer any questions you may have. Thank you for choosing [facility] for your health  care needs. 

## 2015-05-05 NOTE — Progress Notes (Signed)
Report to PACU, RN, vss, BBS= Clear.  

## 2015-05-05 NOTE — Op Note (Signed)
[place]  [address] [place], [address]   COLONOSCOPY PROCEDURE REPORT  PATIENT: [name], [name]  MR#: [id number] BIRTHDATE: 09/25/1941 , 36  yrs. old GENDER: male ENDOSCOPIST: Inda Castle, MD REFERRED [id number] Rupert Stacks, M.D. PROCEDURE DATE:  05/05/2015 PROCEDURE:   Colonoscopy, screening and Colonoscopy with snare polypectomy First Screening Colonoscopy - Avg.  risk and is 50 yrs.  old or older - No.  Prior Negative Screening - Now for repeat screening. Above average risk  History of Adenoma - Now for follow-up colonoscopy & has been > or = to 3 yrs.  N/A  Polyps removed today? Yes ASA CLASS:   Class II INDICATIONS:FH Colon or Rectal Adenocarcinoma. MEDICATIONS: Monitored anesthesia care and Propofol 200 mg IV  DESCRIPTION OF PROCEDURE:   After the risks benefits and alternatives of the procedure were thoroughly explained, informed consent was obtained.  The digital rectal exam revealed no abnormalities of the rectum.   The LB [id number]  endoscope was introduced through the anus and advanced to the cecum, which was identified by both the appendix and ileocecal valve. No adverse events experienced.   The quality of the prep was (Suprep was used) good.  The instrument was then slowly withdrawn as the colon was fully examined. Estimated blood loss is zero unless otherwise noted in this procedure report.      COLON FINDINGS: There was moderate diverticulosis noted in the sigmoid colon with associated muscular hypertrophy.   A sessile polyp measuring 10 mm in size was found in the distal transverse colon.  A polypectomy was performed with a cold snare.  The resection was complete, the polyp tissue was completely retrieved and sent to histology.   A sessile polyp measuring 3 mm in size was found in the descending colon.  A polypectomy was performed with a cold snare.  The resection was complete, the polyp tissue was completely retrieved and sent to  histology.   Internal hemorrhoids were found.  Retroflexed views revealed no abnormalities. The time to cecum = 3.4 Withdrawal time = 8.9   The scope was withdrawn and the procedure completed. COMPLICATIONS: There were no immediate complications.  ENDOSCOPIC IMPRESSION: 1.   There was moderate diverticulosis noted in the sigmoid colon 2.   Sessile polyp was found in the distal transverse colon; polypectomy was performed with a cold snare 3.   Sessile polyp was found in the descending colon; polypectomy was performed with a cold snare 4.   Internal hemorrhoids  RECOMMENDATIONS: Given your significant family history of colon cancer, you should have a repeat colonoscopy in 5 years  eSigned:  Inda Castle, MD 05/05/2015 11:14 AM   cc:   PATIENT NAME:  [name], [name] MR#: [id number]

## 2015-05-06 ENCOUNTER — Telehealth: Payer: Self-pay | Admitting: *Deleted

## 2015-05-06 LAB — HM DIABETES EYE EXAM

## 2015-05-06 NOTE — Telephone Encounter (Signed)
  Follow up Call-  Call back number 05/05/2015  Post procedure Call Back phone  # [phone]  Permission to leave phone message Yes     Patient questions:  Do you have a fever, pain , or abdominal swelling? No. Pain Score  0 *  Have you tolerated food without any problems? Yes.    Have you been able to return to your normal activities? Yes.    Do you have any questions about your discharge instructions: Diet   No. Medications  No. Follow up visit  No.  Do you have questions or concerns about your Care? No.  Actions: * If pain score is 4 or above: No action needed, pain <4.

## 2015-05-11 ENCOUNTER — Encounter: Payer: Self-pay | Admitting: Gastroenterology

## 2015-05-14 ENCOUNTER — Encounter: Payer: Self-pay | Admitting: Internal Medicine

## 2015-05-14 DIAGNOSIS — G4733 Obstructive sleep apnea (adult) (pediatric): Secondary | ICD-10-CM

## 2015-05-14 NOTE — Telephone Encounter (Signed)
Order has been placed and pt email sent to patient

## 2015-05-14 NOTE — Addendum Note (Signed)
Addended by: Inge Rise on: 05/14/2015 02:52 PM   Modules accepted: Orders

## 2015-05-14 NOTE — Telephone Encounter (Signed)
Ok to order replacement CPAP supplies     Dx OSA

## 2015-05-14 NOTE — Telephone Encounter (Signed)
Per pt email sent today: I was a patient of Dr. Gerald Leitz. I ordered my CPAP supplies a week or so ago and when I called to check on them this morning they said they had not heard back from the doctor for approval. They were ordered by [facility] on [address]. Could you approve this as I suppose you are now my sleep doctor? My DOB is [birth year] if you need to review my chart. Thanks   Pt scheduled to see Dr. Annamaria Boots on 08/04/15. Please advise if you are okay with ordering supplies for pt? thanks

## 2015-05-19 ENCOUNTER — Encounter: Payer: Self-pay | Admitting: Endocrinology

## 2015-05-28 ENCOUNTER — Other Ambulatory Visit: Payer: Self-pay | Admitting: Endocrinology

## 2015-05-29 ENCOUNTER — Other Ambulatory Visit: Payer: Self-pay | Admitting: Endocrinology

## 2015-05-31 ENCOUNTER — Other Ambulatory Visit: Payer: Self-pay

## 2015-05-31 MED ORDER — ACCU-CHEK PLASTIC CARTRIDGE MISC: Status: DC

## 2015-06-02 ENCOUNTER — Ambulatory Visit (INDEPENDENT_AMBULATORY_CARE_PROVIDER_SITE_OTHER): Payer: Medicare Other | Admitting: Endocrinology

## 2015-06-02 ENCOUNTER — Encounter: Payer: Self-pay | Admitting: Endocrinology

## 2015-06-02 VITALS — BP 130/84 | HR 61 | Temp 97.5°F | Ht 72.0 in | Wt 264.0 lb

## 2015-06-02 DIAGNOSIS — E1022 Type 1 diabetes mellitus with diabetic chronic kidney disease: Secondary | ICD-10-CM

## 2015-06-02 DIAGNOSIS — N189 Chronic kidney disease, unspecified: Secondary | ICD-10-CM

## 2015-06-02 LAB — POCT GLYCOSYLATED HEMOGLOBIN (HGB A1C): Hemoglobin A1C: 6.4

## 2015-06-02 NOTE — Progress Notes (Signed)
Subjective:    Patient ID: [name], male    DOB: August 06, 1941, 74 y.o.   MRN: [id number]  HPI Pt returns for f/u of diabetes mellitus: DM type: Insulin-requiring type 2 Dx'ed: 5170 Complications: polyneuropathy and renal insufficiency.  Therapy: insulin since 1995 DKA: never Severe hypoglycemia: never. Pancreatitis: never Other: he started pump therapy in mid-2015 Interval history: He is still considering weight-loss surgery.   He takes these pump settings: basal rate of 1.6 units/hr, 24 hrs/day mealtime bolus of 1 unit/8 grams carbohydrate.   correction bolus (which some people call "sensitivity," or "insulin sensitivity ratio," or just "isr") of 1 unit for each 10 by which your glucose exceeds 100.  He averages approx 65 units total, per day.  he has decided not to use a continuous glucose monitor.  no cbg record, but states cbg's are well-controlled.  it is in general higher as the day goes on.   pt states he feels well in general.   Past Medical History  Diagnosis Date  . Hepatic steatosis   . Urolithiasis   . Hypogonadism male   . Gynecomastia   . Anal fissure   . DM type 1 with diabetic peripheral neuropathy   . Personal history of colonic polyps   . Anemia, unspecified   . Duodenitis   . Esophageal stricture   . Arthritis     Spinal OA  . DIABETES MELLITUS, TYPE I 05/20/2007  . HYPERLIPIDEMIA 05/20/2007  . 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 Morbid obesity 09/10/2009  . OBSTRUCTIVE SLEEP APNEA 11/04/2007  . DIASTOLIC DYSFUNCTION 0/17/4944  . RENAL INSUFFICIENCY 05/12/2008  . BACK PAIN, LUMBAR 10/23/2007  . CARDIAC MURMUR, SYSTOLIC 06/28/7590  . Edema 05/12/2008  . Sleep apnea     uses cpap  . Hypertension   . Allergy   . GERD (gastroesophageal reflux disease)     Past Surgical History  Procedure Laterality Date  . Abdominal US  09/1997  . Rest cardiolite  03/19/2003  . Lower arterial  04/13/2004  . Electrocardiogram  02/2006  . Colonoscopy  08/18/2004    diverticulitis  .  Colonoscopy  09/24/2009  . Colon cancer screening    . Flexible sigmoidoscopy  03/04/2001    polyps, anal fissure  . Arm fracture Left 1958    with hardware  . Ganglion cyst excision Left 1980  . Polypectomy      Social History   Social History  . Marital Status: Married    Spouse Name: N/A  . Number of Children: N/A  . Years of Education: N/A   Occupational History  .     Social History Main Topics  . Smoking status: Former Smoker -- 2.00 packs/day for 31 years    Types: Cigarettes    Quit date: 10/23/1982  . Smokeless tobacco: Never Used  . Alcohol Use: No  . Drug Use: No  . Sexual Activity: Not on file   Other Topics Concern  . Not on file   Social History Narrative   Lives with wife in a one story home.  Has a son and a daughter.     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     Education: 2 years of college.       Current Outpatient Prescriptions on File Prior to Visit  Medication Sig Dispense Refill  . ACCU-CHEK AVIVA PLUS test strip Test twice a day 200 each 2  . ACCU-CHEK FASTCLIX LANCETS MISC Use 4 (four) times daily as directed 408 each  2  . alfuzosin (UROXATRAL) 10 MG 24 hr tablet Take 1 tablet by mouth  daily with breakfast 90 tablet 1  . allopurinol (ZYLOPRIM) 300 MG tablet Take 1 tablet by mouth  daily 90 tablet 1  . aspirin (BAYER LOW STRENGTH) 81 MG EC tablet Take 81 mg by mouth daily.      . Blood Glucose Monitoring Suppl (ACCU-CHEK AVIVA PLUS) W/DEVICE KIT Use as directed 1 kit 0  . carvedilol (COREG) 3.125 MG tablet Take 1 tablet (3.125 mg total) by mouth 2 (two) times daily with a meal. 180 tablet 3  . celecoxib (CELEBREX) 200 MG capsule Take 1 capsule by mouth  daily 90 capsule 1  . cycloSPORINE (RESTASIS) 0.05 % ophthalmic emulsion Place 1 drop into both eyes 2 (two) times daily.      . fluticasone (FLONASE) 50 MCG/ACT nasal spray Place 1 spray into the nose daily as needed for allergies.     . folic acid (FOLVITE) 1 MG tablet  Take 2 mg by mouth 2 (two) times daily. 4 tabs daily    . furosemide (LASIX) 20 MG tablet Take 1 tablet by mouth  daily 90 tablet 3  . ibuprofen (ADVIL,MOTRIN) 600 MG tablet Take 1 tablet (600 mg total) by mouth every 8 (eight) hours as needed. 15 tablet 0  . Insulin Infusion Pump Supplies (ACCU-CHEK PLASTIC CARTRIDGE) MISC Use per insulin pump 25 each 2  . insulin lispro (HUMALOG) 100 UNIT/ML injection Use for pump 65 units per day. (Patient taking differently: 65 Units by Continuous infusion (non-IV) route daily. Use for pump 65 units per day.) 2 vial 2  . Multiple Vitamins-Minerals (CENTRUM SILVER PO) Take 1 tablet by mouth daily.      . naproxen (NAPROSYN) 500 MG tablet take 1 tablet by mouth twice a day after meals if needed  0  . Omega-3 Fatty Acids (FISH OIL) 1200 MG CAPS Take 1,200 mg by mouth daily.     Marland Kitchen omeprazole (PRILOSEC) 20 MG capsule Take 1 capsule by mouth  daily as needed 90 capsule 1  . rosuvastatin (CRESTOR) 40 MG tablet Take 1 tablet by mouth at  bedtime 90 tablet 1  . vitamin B-12 (CYANOCOBALAMIN) 1000 MCG tablet Take 1,000 mcg by mouth daily.     No current facility-administered medications on file prior to visit.    Allergies  Allergen Reactions  . Atorvastatin Other (See Comments)    unknown  . Niacin     REACTION: Severe heartburn  . Pioglitazone     REACTION: Edema    Family History  Problem Relation Age of Onset  . Colon cancer Brother 61  . Colon polyps Brother   . Cancer Brother     lung cancer, deceased  . Healthy Daughter   . Healthy Son   . Other Mother     MVA, deceased 56s  . Rectal cancer Neg Hx   . Stomach cancer Neg Hx     BP 130/84 mmHg  Pulse 61  Temp(Src) 97.5 F (36.4 C) (Oral)  Ht 6' (1.829 m)  Wt 264 lb (119.75 kg)  BMI 35.80 kg/m2  SpO2 97%  Review of Systems He denies hypoglycemia.  He has lost a few lbs.      Objective:   Physical Exam VITAL SIGNS:  See vs page GENERAL: no distress Pulses: dorsalis pedis intact  bilat.   MSK: no deformity of the feet CV: 1+ bilat leg edema Skin:  no ulcer on the feet.  normal  color and temp on the feet. Neuro: sensation is intact to touch on the feet, but decreased from normal.     A1c=6.4%    Assessment & Plan:  DM: overcontrolled  Patient is advised the following: Patient Instructions  Please reduce your basal rate to 1.4 units/hr, 24 hrs per day.   continue the mealtime bolus of 1 unit/8 grams carbohydrate.  continue correction bolus (which some people call "sensitivity," or "insulin sensitivity ratio," or just "isr") of 1 unit for each 10 by which your glucose exceeds 100. Please come back for a follow-up appointment in 3-4 months.  check your blood sugar 6 times a day.  vary the time of day when you check, between before the 3 meals, and at bedtime.  also check if you have symptoms of your blood sugar being too high or too low.  please keep a record of the readings and bring it to your next appointment here.  You can write it on any piece of paper.  please call us sooner if your blood sugar goes below 70, or if you have a lot of readings over 200.   We'll recheck the CT scan early next year.

## 2015-06-02 NOTE — Patient Instructions (Addendum)
Please reduce your basal rate to 1.4 units/hr, 24 hrs per day.   continue the mealtime bolus of 1 unit/8 grams carbohydrate.  continue correction bolus (which some people call "sensitivity," or "insulin sensitivity ratio," or just "isr") of 1 unit for each 10 by which your glucose exceeds 100. Please come back for a follow-up appointment in 3-4 months.  check your blood sugar 6 times a day.  vary the time of day when you check, between before the 3 meals, and at bedtime.  also check if you have symptoms of your blood sugar being too high or too low.  please keep a record of the readings and bring it to your next appointment here.  You can write it on any piece of paper.  please call us sooner if your blood sugar goes below 70, or if you have a lot of readings over 200.   We'll recheck the CT scan early next year.

## 2015-06-03 ENCOUNTER — Encounter: Payer: Self-pay | Admitting: Endocrinology

## 2015-06-21 ENCOUNTER — Encounter: Payer: Self-pay | Admitting: Endocrinology

## 2015-06-21 ENCOUNTER — Ambulatory Visit (INDEPENDENT_AMBULATORY_CARE_PROVIDER_SITE_OTHER): Payer: Medicare Other | Admitting: Endocrinology

## 2015-06-21 VITALS — BP 132/82 | HR 75 | Temp 97.6°F | Ht 72.0 in | Wt 262.0 lb

## 2015-06-21 DIAGNOSIS — E1022 Type 1 diabetes mellitus with diabetic chronic kidney disease: Secondary | ICD-10-CM

## 2015-06-21 DIAGNOSIS — N189 Chronic kidney disease, unspecified: Secondary | ICD-10-CM | POA: Diagnosis not present

## 2015-06-21 NOTE — Progress Notes (Signed)
Subjective:    Patient ID: [name], male    DOB: [birth year], 74 y.o.   MRN: [id number]  HPI Pt returns for f/u of diabetes mellitus: DM type: Insulin-requiring type 2 Dx'ed: 8032 Complications: polyneuropathy and renal insufficiency.  Therapy: insulin since 1995 DKA: never Severe hypoglycemia: never. Pancreatitis: never Other: he started pump therapy in mid-2015; he declines continuous glucose monitor Interval history: He is still considering weight-loss surgery.   He takes these pump settings: basal rate of 1.4 units/hr, 24 hrs/day mealtime bolus of 1 unit/8 grams carbohydrate.   correction bolus (which some people call "sensitivity," or "insulin sensitivity ratio," or just "isr") of 1 unit for each 10 by which your glucose exceeds 100.  no cbg record, but states cbg's are well-controlled.  There is no trend throughout the day.  He takes a total of approx 65 units per day. Pt states few weeks of slight nodule at the left leg, but no assoc pain.   Past Medical History  Diagnosis Date  . Hepatic steatosis   . Urolithiasis   . Hypogonadism male   . Gynecomastia   . Anal fissure   . DM type 1 with diabetic peripheral neuropathy   . Personal history of colonic polyps   . Anemia, unspecified   . Duodenitis   . Esophageal stricture   . Arthritis     Spinal OA  . DIABETES MELLITUS, TYPE I 05/20/2007  . HYPERLIPIDEMIA 05/20/2007  . 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 Morbid obesity 09/10/2009  . OBSTRUCTIVE SLEEP APNEA 11/04/2007  . DIASTOLIC DYSFUNCTION 11/14/4823  . RENAL INSUFFICIENCY 05/12/2008  . BACK PAIN, LUMBAR 10/23/2007  . CARDIAC MURMUR, SYSTOLIC 0/0/3704  . Edema 05/12/2008  . Sleep apnea     uses cpap  . Hypertension   . Allergy   . GERD (gastroesophageal reflux disease)     Past Surgical History  Procedure Laterality Date  . Abdominal US  09/1997  . Rest cardiolite  03/19/2003  . Lower arterial  04/13/2004  . Electrocardiogram  02/2006  . Colonoscopy  08/18/2004   diverticulitis  . Colonoscopy  09/24/2009  . Colon cancer screening    . Flexible sigmoidoscopy  03/04/2001    polyps, anal fissure  . Arm fracture Left 1958    with hardware  . Ganglion cyst excision Left 1980  . Polypectomy      Social History   Social History  . Marital Status: Married    Spouse Name: N/A  . Number of Children: N/A  . Years of Education: N/A   Occupational History  .     Social History Main Topics  . Smoking status: Former Smoker -- 2.00 packs/day for 31 years    Types: Cigarettes    Quit date: 10/23/1982  . Smokeless tobacco: Never Used  . Alcohol Use: No  . Drug Use: No  . Sexual Activity: Not on file   Other Topics Concern  . Not on file   Social History Narrative   Lives with wife in a one story home.  Has a son and a daughter.     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     Education: 2 years of college.       Current Outpatient Prescriptions on File Prior to Visit  Medication Sig Dispense Refill  . ACCU-CHEK AVIVA PLUS test strip Test twice a day 200 each 2  . ACCU-CHEK FASTCLIX LANCETS MISC Use 4 (four) times daily as directed 408 each 2  .  alfuzosin (UROXATRAL) 10 MG 24 hr tablet Take 1 tablet by mouth  daily with breakfast 90 tablet 1  . allopurinol (ZYLOPRIM) 300 MG tablet Take 1 tablet by mouth  daily 90 tablet 1  . aspirin (BAYER LOW STRENGTH) 81 MG EC tablet Take 81 mg by mouth daily.      . Blood Glucose Monitoring Suppl (ACCU-CHEK AVIVA PLUS) W/DEVICE KIT Use as directed 1 kit 0  . carvedilol (COREG) 3.125 MG tablet Take 1 tablet (3.125 mg total) by mouth 2 (two) times daily with a meal. 180 tablet 3  . celecoxib (CELEBREX) 200 MG capsule Take 1 capsule by mouth  daily 90 capsule 1  . cycloSPORINE (RESTASIS) 0.05 % ophthalmic emulsion Place 1 drop into both eyes 2 (two) times daily.      . fluticasone (FLONASE) 50 MCG/ACT nasal spray Place 1 spray into the nose daily as needed for allergies.     . folic acid  (FOLVITE) 1 MG tablet Take 2 mg by mouth 2 (two) times daily. 4 tabs daily    . furosemide (LASIX) 20 MG tablet Take 1 tablet by mouth  daily 90 tablet 3  . ibuprofen (ADVIL,MOTRIN) 600 MG tablet Take 1 tablet (600 mg total) by mouth every 8 (eight) hours as needed. 15 tablet 0  . Insulin Infusion Pump Supplies (ACCU-CHEK PLASTIC CARTRIDGE) MISC Use per insulin pump 25 each 2  . insulin lispro (HUMALOG) 100 UNIT/ML injection Use for pump 65 units per day. (Patient taking differently: 65 Units by Continuous infusion (non-IV) route daily. Use for pump 65 units per day.) 2 vial 2  . Multiple Vitamins-Minerals (CENTRUM SILVER PO) Take 1 tablet by mouth daily.      . naproxen (NAPROSYN) 500 MG tablet take 1 tablet by mouth twice a day after meals if needed  0  . Omega-3 Fatty Acids (FISH OIL) 1200 MG CAPS Take 1,200 mg by mouth daily.     Marland Kitchen omeprazole (PRILOSEC) 20 MG capsule Take 1 capsule by mouth  daily as needed 90 capsule 1  . rosuvastatin (CRESTOR) 40 MG tablet Take 1 tablet by mouth at  bedtime 90 tablet 1  . vitamin B-12 (CYANOCOBALAMIN) 1000 MCG tablet Take 1,000 mcg by mouth daily.     No current facility-administered medications on file prior to visit.    Allergies  Allergen Reactions  . Atorvastatin Other (See Comments)    unknown  . Niacin     REACTION: Severe heartburn  . Pioglitazone     REACTION: Edema    Family History  Problem Relation Age of Onset  . Colon cancer Brother 47  . Colon polyps Brother   . Cancer Brother     lung cancer, deceased  . Healthy Daughter   . Healthy Son   . Other Mother     MVA, deceased 73s  . Rectal cancer Neg Hx   . Stomach cancer Neg Hx     BP 132/82 mmHg  Pulse 75  Temp(Src) 97.6 F (36.4 C) (Oral)  Ht 6' (1.829 m)  Wt 262 lb (118.842 kg)  BMI 35.53 kg/m2  SpO2 97%  Review of Systems Denies itching and rash.  He denies hypoglycemia.      Objective:   Physical Exam VITAL SIGNS:  See vs page GENERAL: no distress Left leg:  1-2 cm sq nodule at the medial aspect, about half way between the knee and ankle.     Assessment & Plan:  Sq nodule, new. DM: well-controlled  Patient is advised the following: Patient Instructions  Please continue your basal rate of 1.4 units/hr, 24 hrs per day.   continue the mealtime bolus of 1 unit/8 grams carbohydrate.  continue correction bolus (which some people call "sensitivity," or "insulin sensitivity ratio," or just "isr") of 1 unit for each 10 by which your glucose exceeds 100. Please come back for a follow-up appointment in 4-5 months.  The nodule on you leg needs no treatment, but please call if it enlarges significantly.   check your blood sugar 6 times a day.  vary the time of day when you check, between before the 3 meals, and at bedtime.  also check if you have symptoms of your blood sugar being too high or too low.  please keep a record of the readings and bring it to your next appointment here.  You can write it on any piece of paper.  please call us sooner if your blood sugar goes below 70, or if you have a lot of readings over 200.

## 2015-06-21 NOTE — Patient Instructions (Addendum)
Please continue your basal rate of 1.4 units/hr, 24 hrs per day.   continue the mealtime bolus of 1 unit/8 grams carbohydrate.  continue correction bolus (which some people call "sensitivity," or "insulin sensitivity ratio," or just "isr") of 1 unit for each 10 by which your glucose exceeds 100. Please come back for a follow-up appointment in 4-5 months.  The nodule on you leg needs no treatment, but please call if it enlarges significantly.   check your blood sugar 6 times a day.  vary the time of day when you check, between before the 3 meals, and at bedtime.  also check if you have symptoms of your blood sugar being too high or too low.  please keep a record of the readings and bring it to your next appointment here.  You can write it on any piece of paper.  please call us sooner if your blood sugar goes below 70, or if you have a lot of readings over 200.

## 2015-06-24 ENCOUNTER — Ambulatory Visit: Payer: Medicare Other | Admitting: Pulmonary Disease

## 2015-07-01 ENCOUNTER — Telehealth: Payer: Self-pay | Admitting: Endocrinology

## 2015-07-01 NOTE — Telephone Encounter (Signed)
Rec'd from [facility] forward 6 pages to Dr. Loraine Grip

## 2015-07-30 ENCOUNTER — Other Ambulatory Visit: Payer: Self-pay

## 2015-07-30 ENCOUNTER — Encounter: Payer: Self-pay | Admitting: Endocrinology

## 2015-07-30 MED ORDER — GLUCOSE BLOOD VI STRP: ORAL_STRIP | Status: DC

## 2015-08-04 ENCOUNTER — Ambulatory Visit: Payer: Medicare Other | Admitting: Internal Medicine

## 2015-08-16 ENCOUNTER — Other Ambulatory Visit: Payer: Self-pay | Admitting: Endocrinology

## 2015-09-06 ENCOUNTER — Other Ambulatory Visit: Payer: Self-pay | Admitting: Endocrinology

## 2015-09-11 ENCOUNTER — Other Ambulatory Visit: Payer: Self-pay | Admitting: Endocrinology

## 2015-09-14 ENCOUNTER — Encounter: Payer: Self-pay | Admitting: Cardiology

## 2015-09-29 ENCOUNTER — Encounter: Payer: Medicare Other | Attending: Endocrinology | Admitting: Nutrition

## 2015-09-29 DIAGNOSIS — E1029 Type 1 diabetes mellitus with other diabetic kidney complication: Secondary | ICD-10-CM

## 2015-09-29 DIAGNOSIS — Z713 Dietary counseling and surveillance: Secondary | ICD-10-CM | POA: Insufficient documentation

## 2015-09-29 DIAGNOSIS — R809 Proteinuria, unspecified: Secondary | ICD-10-CM

## 2015-09-29 NOTE — Progress Notes (Signed)
Patient was not able to pair his meter remote to the pump.  We went through the process using the manual and he realized the step that he missed, and we were able to pair the device.  He then tested it.  His FBS today was 143, and he gave himself a correction bolus of 4.3u via the meter remote.  The pump worked as it should.   He had no final questions.

## 2015-09-29 NOTE — Patient Instructions (Signed)
1.  Call customer support when having problems.  Telephone number:  [phone]

## 2015-10-01 ENCOUNTER — Ambulatory Visit (INDEPENDENT_AMBULATORY_CARE_PROVIDER_SITE_OTHER): Payer: Medicare Other | Admitting: Endocrinology

## 2015-10-01 ENCOUNTER — Encounter: Payer: Self-pay | Admitting: Endocrinology

## 2015-10-01 VITALS — BP 140/78 | HR 78 | Temp 97.6°F | Ht 72.0 in | Wt 260.0 lb

## 2015-10-01 DIAGNOSIS — E1029 Type 1 diabetes mellitus with other diabetic kidney complication: Secondary | ICD-10-CM | POA: Diagnosis not present

## 2015-10-01 DIAGNOSIS — Z23 Encounter for immunization: Secondary | ICD-10-CM

## 2015-10-01 DIAGNOSIS — R809 Proteinuria, unspecified: Secondary | ICD-10-CM

## 2015-10-01 LAB — MICROALBUMIN / CREATININE URINE RATIO
CREATININE, U: 197 mg/dL
MICROALB UR: 3.8 mg/dL — AB (ref 0.0–1.9)
Microalb Creat Ratio: 1.9 mg/g (ref 0.0–30.0)

## 2015-10-01 LAB — POCT GLYCOSYLATED HEMOGLOBIN (HGB A1C): Hemoglobin A1C: 6.8

## 2015-10-01 MED ORDER — MOMETASONE FUROATE 0.1 % EX SOLN: Freq: Every day | CUTANEOUS | Status: DC

## 2015-10-01 NOTE — Progress Notes (Signed)
Subjective:    Patient ID: [name], male    DOB: 28-Dec-1940, 74 y.o.   MRN: [id number]  HPI Pt returns for f/u of diabetes mellitus: DM type: Insulin-requiring type 2 Dx'ed: 4098 Complications: polyneuropathy and renal insufficiency.  Therapy: insulin since 1995 DKA: never Severe hypoglycemia: never. Pancreatitis: never Other: he started pump therapy in mid-2015; he declines continuous glucose monitor Interval history: He is still considering weight-loss surgery.   He takes these pump settings: basal rate of 1.4 units/hr, 24 hrs/day mealtime bolus of 1 unit/8 grams carbohydrate.   correction bolus (which some people call "sensitivity," or "insulin sensitivity ratio," or just "isr") of 1 unit for each 10 by which your glucose exceeds 100.  He takes a total of approx 75 units per day.  no cbg record, but states cbg's are often over 200 recently.  It is in general higher as the day goes on.  pt states he feels well in general. Past Medical History  Diagnosis Date  . Hepatic steatosis   . Urolithiasis   . Hypogonadism male   . Gynecomastia   . Anal fissure   . 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[place])   . Personal history of colonic polyps   . Anemia, unspecified   . Duodenitis   . Esophageal stricture   . Arthritis     Spinal OA  . DIABETES MELLITUS, TYPE I 05/20/2007  . HYPERLIPIDEMIA 05/20/2007  . 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 Morbid obesity ([place]) 09/10/2009  . OBSTRUCTIVE SLEEP APNEA 11/04/2007  . DIASTOLIC DYSFUNCTION 11/10/1476  . RENAL INSUFFICIENCY 05/12/2008  . BACK PAIN, LUMBAR 10/23/2007  . CARDIAC MURMUR, SYSTOLIC 12/02/5619  . Edema 05/12/2008  . Sleep apnea     uses cpap  . Hypertension   . Allergy   . GERD (gastroesophageal reflux disease)     Past Surgical History  Procedure Laterality Date  . Abdominal US  09/1997  . Rest cardiolite  03/19/2003  . Lower arterial  04/13/2004  . Electrocardiogram  02/2006  . Colonoscopy  08/18/2004    diverticulitis  .  Colonoscopy  09/24/2009  . Colon cancer screening    . Flexible sigmoidoscopy  03/04/2001    polyps, anal fissure  . Arm fracture Left 1958    with hardware  . Ganglion cyst excision Left 1980  . Polypectomy      Social History   Social History  . Marital Status: Married    Spouse Name: N/A  . Number of Children: N/A  . Years of Education: N/A   Occupational History  .     Social History Main Topics  . Smoking status: Former Smoker -- 2.00 packs/day for 31 years    Types: Cigarettes    Quit date: 10/23/1982  . Smokeless tobacco: Never Used  . Alcohol Use: No  . Drug Use: No  . Sexual Activity: Not on file   Other Topics Concern  . Not on file   Social History Narrative   Lives with wife in a one story home.  Has a son and a daughter.     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     Education: 2 years of college.       Current Outpatient Prescriptions on File Prior to Visit  Medication Sig Dispense Refill  . ACCU-CHEK FASTCLIX LANCETS MISC Use 4 (four) times daily as directed 408 each 2  . alfuzosin (UROXATRAL) 10 MG 24 hr tablet Take 1 tablet by mouth  daily with breakfast 90 tablet 1  . allopurinol (ZYLOPRIM) 300 MG tablet Take 1 tablet by mouth  daily 90 tablet 1  . aspirin (BAYER LOW STRENGTH) 81 MG EC tablet Take 81 mg by mouth daily.      . Blood Glucose Monitoring Suppl (ACCU-CHEK AVIVA PLUS) W/DEVICE KIT Use as directed 1 kit 0  . carvedilol (COREG) 3.125 MG tablet Take 1 tablet by mouth two  times daily with a meal 180 tablet 2  . celecoxib (CELEBREX) 200 MG capsule Take 1 capsule by mouth  daily 90 capsule 1  . cycloSPORINE (RESTASIS) 0.05 % ophthalmic emulsion Place 1 drop into both eyes 2 (two) times daily.      . fluticasone (FLONASE) 50 MCG/ACT nasal spray Place 1 spray into the nose daily as needed for allergies.     . folic acid (FOLVITE) 1 MG tablet Take 2 mg by mouth 2 (two) times daily. 4 tabs daily    . furosemide (LASIX) 20 MG  tablet Take 1 tablet by mouth  daily 90 tablet 2  . glucose blood (ACCU-CHEK AVIVA PLUS) test strip Use to test blood sugar 6 times per day 550 each 2  . ibuprofen (ADVIL,MOTRIN) 600 MG tablet Take 1 tablet (600 mg total) by mouth every 8 (eight) hours as needed. 15 tablet 0  . insulin lispro (HUMALOG) 100 UNIT/ML injection Use for pump 65 units per day. (Patient taking differently: 65 Units by Continuous infusion (non-IV) route daily. Use for pump 65 units per day.) 2 vial 2  . Multiple Vitamins-Minerals (CENTRUM SILVER PO) Take 1 tablet by mouth daily.      . Omega-3 Fatty Acids (FISH OIL) 1200 MG CAPS Take 1,200 mg by mouth daily.     Marland Kitchen omeprazole (PRILOSEC) 20 MG capsule Take 1 capsule by mouth  daily as needed 90 capsule 1  . rosuvastatin (CRESTOR) 40 MG tablet Take 1 tablet by mouth at  bedtime 90 tablet 1  . vitamin B-12 (CYANOCOBALAMIN) 1000 MCG tablet Take 1,000 mcg by mouth daily.    . Insulin Infusion Pump Supplies (ACCU-CHEK PLASTIC CARTRIDGE) MISC Use per insulin pump (Patient not taking: Reported on 10/01/2015) 25 each 2  . naproxen (NAPROSYN) 500 MG tablet take 1 tablet by mouth twice a day after meals if needed  0   No current facility-administered medications on file prior to visit.    Allergies  Allergen Reactions  . Atorvastatin Other (See Comments)    unknown  . Niacin     REACTION: Severe heartburn  . Pioglitazone     REACTION: Edema    Family History  Problem Relation Age of Onset  . Colon cancer Brother 9  . Colon polyps Brother   . Cancer Brother     lung cancer, deceased  . Healthy Daughter   . Healthy Son   . Other Mother     MVA, deceased 45s  . Rectal cancer Neg Hx   . Stomach cancer Neg Hx     BP 140/78 mmHg  Pulse 78  Temp(Src) 97.6 F (36.4 C) (Oral)  Ht 6' (1.829 m)  Wt 260 lb (117.935 kg)  BMI 35.25 kg/m2  SpO2 97%  Review of Systems He denies hypoglycemia.     Objective:   Physical Exam VITAL SIGNS:  See vs page GENERAL: no  distress Pulses: dorsalis pedis intact bilat.  MSK: no deformity of the feet CV: trace bilat leg edema Skin: no ulcer on the feet. normal color and temp  on the feet. Neuro: sensation is intact to touch on the feet, but decreased from normal.    A1c=6.8%    Assessment & Plan:  DM: well-controlled  Patient is advised the following: Patient Instructions  Please continue your basal rate of 1.4 units/hr, 24 hrs per day.   continue the mealtime bolus of 1 unit/8 grams carbohydrate.   continue correction bolus (which some people call "sensitivity," or "insulin sensitivity ratio," or just "isr") of 1 unit for each 10 by which your glucose exceeds 100.  check your blood sugar 6 times a day.  vary the time of day when you check, between before the 3 meals, and at bedtime.  also check if you have symptoms of your blood sugar being too high or too low.  please keep a record of the readings and bring it to your next appointment here.  You can write it on any piece of paper.  please call us sooner if your blood sugar goes below 70, or if you have a lot of readings over 200.   Please come back for a regular physical appointment in 3 months.

## 2015-10-01 NOTE — Patient Instructions (Addendum)
Please continue your basal rate of 1.4 units/hr, 24 hrs per day.   continue the mealtime bolus of 1 unit/8 grams carbohydrate.   continue correction bolus (which some people call "sensitivity," or "insulin sensitivity ratio," or just "isr") of 1 unit for each 10 by which your glucose exceeds 100.  check your blood sugar 6 times a day.  vary the time of day when you check, between before the 3 meals, and at bedtime.  also check if you have symptoms of your blood sugar being too high or too low.  please keep a record of the readings and bring it to your next appointment here.  You can write it on any piece of paper.  please call us sooner if your blood sugar goes below 70, or if you have a lot of readings over 200.   Please come back for a regular physical appointment in 3 months.

## 2015-10-07 ENCOUNTER — Ambulatory Visit (INDEPENDENT_AMBULATORY_CARE_PROVIDER_SITE_OTHER): Payer: Medicare Other | Admitting: Internal Medicine

## 2015-10-07 ENCOUNTER — Encounter: Payer: Self-pay | Admitting: Internal Medicine

## 2015-10-07 VITALS — BP 128/60 | HR 70 | Ht 72.0 in | Wt 269.2 lb

## 2015-10-07 DIAGNOSIS — I10 Essential (primary) hypertension: Secondary | ICD-10-CM

## 2015-10-07 DIAGNOSIS — G4733 Obstructive sleep apnea (adult) (pediatric): Secondary | ICD-10-CM | POA: Diagnosis not present

## 2015-10-07 NOTE — Patient Instructions (Signed)
We can continue CPAP/ [facility]  Please call if we can help

## 2015-10-07 NOTE — Assessment & Plan Note (Signed)
Discussed interaction obesity, OSA, HBP

## 2015-10-07 NOTE — Progress Notes (Signed)
   Subjective:    Patient ID: [name], male    DOB: [birth year], 74 y.o.   MRN: [id number]  HPI  06/23/14- Dr Gwenette Greet The patient comes in today for followup of his obstructive sleep apnea. He is wearing CPAP compliantly, and is having no issues with his pressure. He is having some issues with mask leaking, and is having to pull the mask tighter. He feels that he is sleeping well with the device, and is satisfied with his daytime alertness. He has lost 11 pounds since the last visit here.  10/07/2015-74 year old male followed for OSA, complicated by obesity, HBP,  NPSG 2004 AHI 9/hr CPAP/11.6/[facility]; Wears CPAP every night Says sleep is "great" with CPAP used every night "can't sleep without it". Download reviewed.  Review of Systems  Constitutional: Negative for fever and unexpected weight change.  HENT: Negative for congestion, dental problem, ear pain, nosebleeds, postnasal drip, rhinorrhea, sinus pressure, sneezing, sore throat and trouble swallowing.   Eyes: Negative for redness and itching.  Respiratory: Negative for cough, chest tightness, shortness of breath and wheezing.   Cardiovascular: Negative for palpitations and leg swelling.  Gastrointestinal: Negative for nausea and vomiting.  Genitourinary: Negative for dysuria.  Musculoskeletal: Negative for joint swelling.  Skin: Negative for rash.  Neurological: Negative for headaches.  Hematological: Does not bruise/bleed easily.  Psychiatric/Behavioral: Negative for dysphoric mood. The patient is not nervous/anxious.    Objective:  OBJ- Physical Exam General- Alert, Oriented, Affect-appropriate, Distress- none acute, + obese Skin- rash-none, lesions- none, excoriation- none Lymphadenopathy- none Head- atraumatic            Eyes- Gross vision intact, PERRLA, conjunctivae and secretions clear            Ears- Hearing, canals-normal            Nose- Clear, no-Septal dev, mucus, polyps, erosion,  perforation             Throat- Mallampati II , mucosa clear , drainage- none, tonsils- atrophic Neck- flexible , trachea midline, no stridor , thyroid nl, carotid no bruit Chest - symmetrical excursion , unlabored           Heart/CV- RRR , no murmur , no gallop  , no rub, nl s1 s2                           - JVD- none , edema- none, stasis changes- none, varices- none           Lung- clear to P&A, wheeze- none, cough- none , dullness-none, rub- none           Chest wall-  Abd-  Br/ Gen/ Rectal- Not done, not indicated Extrem- cyanosis- none, clubbing, none, atrophy- none, strength- nl Neuro- grossly intact to observation        Assessment & Plan:

## 2015-10-07 NOTE — Assessment & Plan Note (Signed)
Download confirms good compliance and control. He is quite pleased and requests no changes. Pressure at 11.6

## 2015-10-16 ENCOUNTER — Other Ambulatory Visit: Payer: Self-pay | Admitting: Endocrinology

## 2015-10-27 ENCOUNTER — Other Ambulatory Visit: Payer: Self-pay | Admitting: Endocrinology

## 2015-11-11 ENCOUNTER — Encounter: Payer: Self-pay | Admitting: Gastroenterology

## 2015-11-18 ENCOUNTER — Other Ambulatory Visit: Payer: Self-pay | Admitting: Endocrinology

## 2015-11-29 ENCOUNTER — Other Ambulatory Visit: Payer: Self-pay

## 2015-11-29 ENCOUNTER — Encounter: Payer: Self-pay | Admitting: Endocrinology

## 2015-11-29 MED ORDER — ACCU-CHEK PLASTIC CARTRIDGE MISC: Status: DC

## 2015-12-07 ENCOUNTER — Encounter: Payer: Self-pay | Admitting: Endocrinology

## 2015-12-08 ENCOUNTER — Other Ambulatory Visit: Payer: Self-pay

## 2015-12-08 MED ORDER — ACCU-CHEK RAPID-D INFUSION SET MISC: Status: DC

## 2015-12-30 ENCOUNTER — Encounter: Payer: Self-pay | Admitting: Endocrinology

## 2015-12-30 ENCOUNTER — Ambulatory Visit (INDEPENDENT_AMBULATORY_CARE_PROVIDER_SITE_OTHER): Payer: Medicare Other | Admitting: Endocrinology

## 2015-12-30 VITALS — BP 128/70 | HR 80 | Temp 98.1°F | Ht 73.0 in | Wt 266.0 lb

## 2015-12-30 DIAGNOSIS — R911 Solitary pulmonary nodule: Secondary | ICD-10-CM | POA: Insufficient documentation

## 2015-12-30 DIAGNOSIS — E1122 Type 2 diabetes mellitus with diabetic chronic kidney disease: Secondary | ICD-10-CM

## 2015-12-30 DIAGNOSIS — E1029 Type 1 diabetes mellitus with other diabetic kidney complication: Secondary | ICD-10-CM | POA: Diagnosis not present

## 2015-12-30 DIAGNOSIS — R809 Proteinuria, unspecified: Secondary | ICD-10-CM

## 2015-12-30 DIAGNOSIS — Z794 Long term (current) use of insulin: Secondary | ICD-10-CM

## 2015-12-30 DIAGNOSIS — N183 Chronic kidney disease, stage 3 (moderate): Secondary | ICD-10-CM

## 2015-12-30 LAB — POCT GLYCOSYLATED HEMOGLOBIN (HGB A1C): Hemoglobin A1C: 7.4

## 2015-12-30 NOTE — Progress Notes (Signed)
Subjective:    Patient ID: [name], male    DOB: June 22, 1941, 75 y.o.   MRN: [id number]  HPI Pt returns for f/u of diabetes mellitus: DM type: Insulin-requiring type 2 Dx'ed: 1093 Complications: polyneuropathy and renal insufficiency.  Therapy: insulin since 1995 DKA: never Severe hypoglycemia: never. Pancreatitis: never Other: he started pump therapy in mid-2015; he declines continuous glucose monitor Interval history: He is still considering weight-loss surgery.   He takes these pump settings: basal rate of 1.4 units/hr, 24 hrs/day mealtime bolus of 1 unit/8 grams carbohydrate.   correction bolus (which some people call "sensitivity," or "insulin sensitivity ratio," or just "isr") of 1 unit for each 10 by which your glucose exceeds 100.  He takes a total of approx 75 units per day.  Meter is downloaded today, and the printout is scanned into the record.  It is lowest in the afternoon, and higher at all other times.  It is lower in am than other times of day.   He has slight redness at the infusion sites (ant abdomen), and slight assoc itching Past Medical History  Diagnosis Date  . Hepatic steatosis   . Urolithiasis   . Hypogonadism male   . Gynecomastia   . Anal fissure   . 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 Personal history of colonic polyps   . Anemia, unspecified   . Duodenitis   . Esophageal stricture   . Arthritis     Spinal OA  . DIABETES MELLITUS, TYPE I 05/20/2007  . HYPERLIPIDEMIA 05/20/2007  . 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 Morbid obesity ([place]) 09/10/2009  . OBSTRUCTIVE SLEEP APNEA 11/04/2007  . DIASTOLIC DYSFUNCTION 2/35/5732  . RENAL INSUFFICIENCY 05/12/2008  . BACK PAIN, LUMBAR 10/23/2007  . CARDIAC MURMUR, SYSTOLIC 2/0/2542  . Edema 05/12/2008  . Sleep apnea     uses cpap  . Hypertension   . Allergy   . GERD (gastroesophageal reflux disease)     Past Surgical History  Procedure Laterality Date  . Abdominal US  09/1997  . Rest cardiolite   03/19/2003  . Lower arterial  04/13/2004  . Electrocardiogram  02/2006  . Colonoscopy  08/18/2004    diverticulitis  . Colonoscopy  09/24/2009  . Colon cancer screening    . Flexible sigmoidoscopy  03/04/2001    polyps, anal fissure  . Arm fracture Left 1958    with hardware  . Ganglion cyst excision Left 1980  . Polypectomy      Social History   Social History  . Marital Status: Married    Spouse Name: N/A  . Number of Children: N/A  . Years of Education: N/A   Occupational History  .     Social History Main Topics  . Smoking status: Former Smoker -- 2.00 packs/day for 31 years    Types: Cigarettes    Quit date: 10/23/1982  . Smokeless tobacco: Never Used  . Alcohol Use: No  . Drug Use: No  . Sexual Activity: Not on file   Other Topics Concern  . Not on file   Social History Narrative   Lives with wife in a one story home.  Has a son and a daughter.     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     Education: 2 years of college.       Current Outpatient Prescriptions on File Prior to Visit  Medication Sig Dispense Refill  . ACCU-CHEK FASTCLIX LANCETS MISC Test 4 times  daily as  directed 408 each 2  . alfuzosin (UROXATRAL) 10 MG 24 hr tablet Take 1 tablet by mouth  daily with breakfast 90 tablet 1  . allopurinol (ZYLOPRIM) 300 MG tablet Take 1 tablet by mouth  daily 90 tablet 1  . aspirin (BAYER LOW STRENGTH) 81 MG EC tablet Take 81 mg by mouth daily.      . Blood Glucose Monitoring Suppl (ACCU-CHEK AVIVA PLUS) W/DEVICE KIT Use as directed 1 kit 0  . carvedilol (COREG) 3.125 MG tablet Take 1 tablet by mouth two  times daily with a meal 180 tablet 2  . celecoxib (CELEBREX) 200 MG capsule Take 1 capsule by mouth  daily 90 capsule 1  . cycloSPORINE (RESTASIS) 0.05 % ophthalmic emulsion Place 1 drop into both eyes 2 (two) times daily.      . fluticasone (FLONASE) 50 MCG/ACT nasal spray Place 1 spray into the nose daily as needed for allergies.     . folic  acid (FOLVITE) 1 MG tablet Take 2 mg by mouth 2 (two) times daily. 4 tabs daily    . furosemide (LASIX) 20 MG tablet Take 1 tablet by mouth  daily 90 tablet 2  . glucose blood (ACCU-CHEK AVIVA PLUS) test strip Use to test blood sugar 6 times per day 550 each 2  . HUMALOG 100 UNIT/ML injection Inject subcutaneously 65  units per day 60 mL 1  . ibuprofen (ADVIL,MOTRIN) 600 MG tablet Take 1 tablet (600 mg total) by mouth every 8 (eight) hours as needed. 15 tablet 0  . Insulin Infusion Pump Supplies (ACCU-CHEK PLASTIC CARTRIDGE) MISC Use as directed per insulin pump 15 each 2  . Insulin Infusion Pump Supplies (ACCU-CHEK RAPID-D INFUSION SET) MISC Use per insulin pump 15 each 2  . mometasone (ELOCON) 0.1 % lotion Apply topically daily. 60 mL 1  . Multiple Vitamins-Minerals (CENTRUM SILVER PO) Take 1 tablet by mouth daily.      . naproxen (NAPROSYN) 500 MG tablet take 1 tablet by mouth twice a day after meals if needed  0  . Omega-3 Fatty Acids (FISH OIL) 1200 MG CAPS Take 1,200 mg by mouth daily.     Marland Kitchen omeprazole (PRILOSEC) 20 MG capsule Take 1 capsule by mouth  daily as needed 90 capsule 2  . rosuvastatin (CRESTOR) 40 MG tablet Take 1 tablet by mouth at  bedtime 90 tablet 2  . vitamin B-12 (CYANOCOBALAMIN) 1000 MCG tablet Take 1,000 mcg by mouth daily.     No current facility-administered medications on file prior to visit.    Allergies  Allergen Reactions  . Atorvastatin Other (See Comments)    unknown  . Niacin     REACTION: Severe heartburn  . Pioglitazone     REACTION: Edema    Family History  Problem Relation Age of Onset  . Colon cancer Brother 75  . Colon polyps Brother   . Cancer Brother     lung cancer, deceased  . Healthy Daughter   . Healthy Son   . Other Mother     MVA, deceased 24s  . Rectal cancer Neg Hx   . Stomach cancer Neg Hx     BP 128/70 mmHg  Pulse 80  Temp(Src) 98.1 F (36.7 C) (Oral)  Ht '6\' 1"'  (1.854 m)  Wt 266 lb (120.657 kg)  BMI 35.10 kg/m2  SpO2  97%  Review of Systems He denies hypoglycemia.  No weight change.      Objective:   Physical Exam VITAL  SIGNS:  See vs page GENERAL: no distress SKIN:  Insulin injection sites at the anterior abdomen are normal, except for slight erythema in the shape of each adhesive patch.   A1c=7.4%     Assessment & Plan:  DM: glycemic control is slightly worse.  Abnormal CT findings in the chest.  Local reaction to infusions, new.    Patient is advised the following: Patient Instructions  Please continue your basal rate of 1.4 units/hr, 24 hrs per day.   increase the mealtime bolus to 1 unit/7 grams carbohydrate.  However, subtract 4 units from your calculated lunch bolus.  continue correction bolus (which some people call "sensitivity," or "insulin sensitivity ratio," or just "isr") of 1 unit for each 10 by which your glucose exceeds 100.  check your blood sugar 6 times a day.  vary the time of day when you check, between before the 3 meals, and at bedtime.  also check if you have symptoms of your blood sugar being too high or too low.  please keep a record of the readings and bring it to your next appointment here.  You can write it on any piece of paper.  please call us sooner if your blood sugar goes below 70, or if you have a lot of readings over 200.   i have sent a message to [facility], to see what can be done about the redness at the infusion sites. Let's recheck the CT.  you will receive a phone call, about a day and time for an appointment Please come back for a regular physical appointment in 3 months.

## 2015-12-30 NOTE — Patient Instructions (Addendum)
Please continue your basal rate of 1.4 units/hr, 24 hrs per day.   increase the mealtime bolus to 1 unit/7 grams carbohydrate.  However, subtract 4 units from your calculated lunch bolus.  continue correction bolus (which some people call "sensitivity," or "insulin sensitivity ratio," or just "isr") of 1 unit for each 10 by which your glucose exceeds 100.  check your blood sugar 6 times a day.  vary the time of day when you check, between before the 3 meals, and at bedtime.  also check if you have symptoms of your blood sugar being too high or too low.  please keep a record of the readings and bring it to your next appointment here.  You can write it on any piece of paper.  please call us sooner if your blood sugar goes below 70, or if you have a lot of readings over 200.   i have sent a message to [facility], to see what can be done about the redness at the infusion sites. Let's recheck the CT.  you will receive a phone call, about a day and time for an appointment Please come back for a regular physical appointment in 3 months.

## 2015-12-31 ENCOUNTER — Ambulatory Visit (INDEPENDENT_AMBULATORY_CARE_PROVIDER_SITE_OTHER)
Admission: RE | Admit: 2015-12-31 | Discharge: 2015-12-31 | Disposition: A | Discharge status: Home / Self Care | Payer: Medicare Other | Source: Ambulatory Visit | Attending: Endocrinology | Admitting: Endocrinology

## 2015-12-31 DIAGNOSIS — R911 Solitary pulmonary nodule: Secondary | ICD-10-CM

## 2016-01-01 DIAGNOSIS — E119 Type 2 diabetes mellitus without complications: Secondary | ICD-10-CM | POA: Insufficient documentation

## 2016-01-28 ENCOUNTER — Other Ambulatory Visit: Payer: Self-pay | Admitting: Endocrinology

## 2016-02-01 ENCOUNTER — Other Ambulatory Visit: Payer: Self-pay

## 2016-02-01 MED ORDER — GLUCOSE BLOOD VI STRP: ORAL_STRIP | Status: DC

## 2016-03-31 ENCOUNTER — Encounter: Payer: Self-pay | Admitting: Endocrinology

## 2016-03-31 ENCOUNTER — Ambulatory Visit (INDEPENDENT_AMBULATORY_CARE_PROVIDER_SITE_OTHER): Payer: Medicare Other | Admitting: Endocrinology

## 2016-03-31 VITALS — BP 132/64 | HR 77 | Ht 73.0 in | Wt 268.0 lb

## 2016-03-31 DIAGNOSIS — N183 Chronic kidney disease, stage 3 unspecified: Secondary | ICD-10-CM

## 2016-03-31 DIAGNOSIS — E1122 Type 2 diabetes mellitus with diabetic chronic kidney disease: Secondary | ICD-10-CM

## 2016-03-31 DIAGNOSIS — Z0000 Encounter for general adult medical examination without abnormal findings: Secondary | ICD-10-CM

## 2016-03-31 DIAGNOSIS — Z794 Long term (current) use of insulin: Secondary | ICD-10-CM

## 2016-03-31 DIAGNOSIS — D61818 Other pancytopenia: Secondary | ICD-10-CM | POA: Diagnosis not present

## 2016-03-31 DIAGNOSIS — Z125 Encounter for screening for malignant neoplasm of prostate: Secondary | ICD-10-CM

## 2016-03-31 DIAGNOSIS — I10 Essential (primary) hypertension: Secondary | ICD-10-CM | POA: Diagnosis not present

## 2016-03-31 DIAGNOSIS — E785 Hyperlipidemia, unspecified: Secondary | ICD-10-CM

## 2016-03-31 DIAGNOSIS — M109 Gout, unspecified: Secondary | ICD-10-CM | POA: Diagnosis not present

## 2016-03-31 LAB — CBC WITH DIFFERENTIAL/PLATELET
BASOS ABS: 0 10*3/uL (ref 0.0–0.1)
Basophils Relative: 0.4 % (ref 0.0–3.0)
EOS ABS: 0.1 10*3/uL (ref 0.0–0.7)
Eosinophils Relative: 2 % (ref 0.0–5.0)
HCT: 44.7 % (ref 39.0–52.0)
HEMOGLOBIN: 15.1 g/dL (ref 13.0–17.0)
LYMPHS ABS: 1.8 10*3/uL (ref 0.7–4.0)
Lymphocytes Relative: 38 % (ref 12.0–46.0)
MCHC: 33.7 g/dL (ref 30.0–36.0)
MCV: 91.3 fl (ref 78.0–100.0)
MONO ABS: 0.3 10*3/uL (ref 0.1–1.0)
Monocytes Relative: 6.6 % (ref 3.0–12.0)
NEUTROS PCT: 53 % (ref 43.0–77.0)
Neutro Abs: 2.5 10*3/uL (ref 1.4–7.7)
Platelets: 105 10*3/uL — ABNORMAL LOW (ref 150.0–400.0)
RBC: 4.9 Mil/uL (ref 4.22–5.81)
RDW: 14.3 % (ref 11.5–15.5)
WBC: 4.8 10*3/uL (ref 4.0–10.5)

## 2016-03-31 LAB — MICROALBUMIN / CREATININE URINE RATIO
Creatinine,U: 134.1 mg/dL
Microalb Creat Ratio: 0.7 mg/g (ref 0.0–30.0)
Microalb, Ur: 0.9 mg/dL (ref 0.0–1.9)

## 2016-03-31 LAB — POCT GLYCOSYLATED HEMOGLOBIN (HGB A1C): HEMOGLOBIN A1C: 7.3

## 2016-03-31 LAB — TSH: TSH: 4.94 u[IU]/mL — ABNORMAL HIGH (ref 0.35–4.50)

## 2016-03-31 LAB — BASIC METABOLIC PANEL
BUN: 36 mg/dL — AB (ref 6–23)
CHLORIDE: 103 meq/L (ref 96–112)
CO2: 30 mEq/L (ref 19–32)
Calcium: 9.3 mg/dL (ref 8.4–10.5)
Creatinine, Ser: 1.52 mg/dL — ABNORMAL HIGH (ref 0.40–1.50)
GFR: 47.83 mL/min — AB (ref >60.00)
GLUCOSE: 153 mg/dL — AB (ref 70–99)
POTASSIUM: 4 meq/L (ref 3.5–5.1)
SODIUM: 141 meq/L (ref 135–145)

## 2016-03-31 LAB — HEPATIC FUNCTION PANEL
ALBUMIN: 4.2 g/dL (ref 3.5–5.2)
ALT: 21 U/L (ref 0–53)
AST: 17 U/L (ref 0–37)
Alkaline Phosphatase: 66 U/L (ref 39–117)
Bilirubin, Direct: 0.1 mg/dL (ref 0.0–0.3)
TOTAL PROTEIN: 6.5 g/dL (ref 6.0–8.3)
Total Bilirubin: 0.6 mg/dL (ref 0.2–1.2)

## 2016-03-31 LAB — LIPID PANEL
CHOLESTEROL: 131 mg/dL (ref 0–200)
HDL: 35.4 mg/dL — ABNORMAL LOW (ref >39.00)
LDL CALC: 70 mg/dL (ref 0–99)
NonHDL: 95.65
TRIGLYCERIDES: 126 mg/dL (ref 0.0–149.0)
Total CHOL/HDL Ratio: 4
VLDL: 25.2 mg/dL (ref 0.0–40.0)

## 2016-03-31 LAB — URIC ACID: URIC ACID, SERUM: 4.8 mg/dL (ref 4.0–7.8)

## 2016-03-31 LAB — PSA: PSA: 1.97 ng/mL (ref 0.10–4.00)

## 2016-03-31 MED ORDER — CARVEDILOL 3.125 MG PO TABS: 1.5625 mg | ORAL_TABLET | Freq: Two times a day (BID) | ORAL | Status: DC

## 2016-03-31 NOTE — Progress Notes (Signed)
we discussed code status.  pt requests full code, but would not want to be started or maintained on artificial life-support measures if there was not a reasonable chance of recovery 

## 2016-03-31 NOTE — Progress Notes (Signed)
Subjective:    Patient ID: [name], male    DOB: 1941-01-19, 75 y.o.   MRN: [id number]  HPI Pt is here for regular wellness examination, and is feeling pretty well in general, and says chronic med probs are stable, except as noted below Past Medical History  Diagnosis Date  . Hepatic steatosis   . Urolithiasis   . Hypogonadism male   . Gynecomastia   . Anal fissure   . 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 Personal history of colonic polyps   . Anemia, unspecified   . Duodenitis   . Esophageal stricture   . Arthritis     Spinal OA  . DIABETES MELLITUS, TYPE I 05/20/2007  . HYPERLIPIDEMIA 05/20/2007  . 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 Morbid obesity ([place]) 09/10/2009  . OBSTRUCTIVE SLEEP APNEA 11/04/2007  . DIASTOLIC DYSFUNCTION 6/97/9480  . RENAL INSUFFICIENCY 05/12/2008  . BACK PAIN, LUMBAR 10/23/2007  . CARDIAC MURMUR, SYSTOLIC 10/29/5535  . Edema 05/12/2008  . Sleep apnea     uses cpap  . Hypertension   . Allergy   . GERD (gastroesophageal reflux disease)     Past Surgical History  Procedure Laterality Date  . Abdominal US  09/1997  . Rest cardiolite  03/19/2003  . Lower arterial  04/13/2004  . Electrocardiogram  02/2006  . Colonoscopy  08/18/2004    diverticulitis  . Colonoscopy  09/24/2009  . Colon cancer screening    . Flexible sigmoidoscopy  03/04/2001    polyps, anal fissure  . Arm fracture Left 1958    with hardware  . Ganglion cyst excision Left 1980  . Polypectomy      Social History   Social History  . Marital Status: Married    Spouse Name: N/A  . Number of Children: N/A  . Years of Education: N/A   Occupational History  .     Social History Main Topics  . Smoking status: Former Smoker -- 2.00 packs/day for 31 years    Types: Cigarettes    Quit date: 10/23/1982  . Smokeless tobacco: Never Used  . Alcohol Use: No  . Drug Use: No  . Sexual Activity: Not on file   Other Topics Concern  . Not on file   Social History Narrative   Lives  with wife in a one story home.  Has a son and a daughter.     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     Education: 2 years of college.       Current Outpatient Prescriptions on File Prior to Visit  Medication Sig Dispense Refill  . ACCU-CHEK FASTCLIX LANCETS MISC Test 4 times daily as  directed 408 each 2  . alfuzosin (UROXATRAL) 10 MG 24 hr tablet Take 1 tablet by mouth  daily with breakfast 90 tablet 2  . allopurinol (ZYLOPRIM) 300 MG tablet Take 1 tablet by mouth  daily 90 tablet 2  . aspirin (BAYER LOW STRENGTH) 81 MG EC tablet Take 81 mg by mouth daily.      . Blood Glucose Monitoring Suppl (ACCU-CHEK AVIVA PLUS) W/DEVICE KIT Use as directed 1 kit 0  . celecoxib (CELEBREX) 200 MG capsule Take 1 capsule by mouth  daily 90 capsule 1  . cycloSPORINE (RESTASIS) 0.05 % ophthalmic emulsion Place 1 drop into both eyes 2 (two) times daily.      . fluticasone (FLONASE) 50 MCG/ACT nasal spray Place 1 spray into the nose daily as needed for  allergies.     . folic acid (FOLVITE) 1 MG tablet Take 2 mg by mouth 2 (two) times daily. 4 tabs daily    . furosemide (LASIX) 20 MG tablet Take 1 tablet by mouth  daily 90 tablet 2  . glucose blood (ACCU-CHEK AVIVA PLUS) test strip Use to test blood sugar 6  times per day 550 each 1  . HUMALOG 100 UNIT/ML injection Inject subcutaneously 65  units per day 60 mL 2  . ibuprofen (ADVIL,MOTRIN) 600 MG tablet Take 1 tablet (600 mg total) by mouth every 8 (eight) hours as needed. 15 tablet 0  . Insulin Infusion Pump Supplies (ACCU-CHEK PLASTIC CARTRIDGE) MISC Use as directed per insulin pump 15 each 2  . Insulin Infusion Pump Supplies (ACCU-CHEK RAPID-D INFUSION SET) MISC Use per insulin pump 15 each 2  . mometasone (ELOCON) 0.1 % lotion Apply topically daily. 60 mL 1  . Multiple Vitamins-Minerals (CENTRUM SILVER PO) Take 1 tablet by mouth daily.      . naproxen (NAPROSYN) 500 MG tablet take 1 tablet by mouth twice a day after meals if needed  0    . Omega-3 Fatty Acids (FISH OIL) 1200 MG CAPS Take 1,200 mg by mouth daily.     Marland Kitchen omeprazole (PRILOSEC) 20 MG capsule Take 1 capsule by mouth  daily as needed 90 capsule 2  . rosuvastatin (CRESTOR) 40 MG tablet Take 1 tablet by mouth at  bedtime 90 tablet 2  . vitamin B-12 (CYANOCOBALAMIN) 1000 MCG tablet Take 1,000 mcg by mouth daily.     No current facility-administered medications on file prior to visit.    Allergies  Allergen Reactions  . Atorvastatin Other (See Comments)    unknown  . Niacin     REACTION: Severe heartburn  . Pioglitazone     REACTION: Edema    Family History  Problem Relation Age of Onset  . Colon cancer Brother 34  . Colon polyps Brother   . Cancer Brother     lung cancer, deceased  . Healthy Daughter   . Healthy Son   . Other Mother     MVA, deceased 56s  . Rectal cancer Neg Hx   . Stomach cancer Neg Hx     BP 132/64 mmHg  Pulse 77  Ht '6\' 1"'  (1.854 m)  Wt 268 lb (121.564 kg)  BMI 35.37 kg/m2  SpO2 97%  Review of Systems  Constitutional: Negative for fever.  HENT: Negative for hearing loss.   Eyes: Negative for visual disturbance.  Respiratory: Negative for shortness of breath.   Cardiovascular:       No change in chronic doe  Gastrointestinal: Negative for anal bleeding.  Endocrine: Positive for cold intolerance.  Genitourinary: Negative for hematuria.  Musculoskeletal:       No change in chronic low back pain  Skin: Negative for rash.  Allergic/Immunologic: Positive for environmental allergies.  Neurological: Positive for numbness. Negative for syncope and headaches.  Hematological: Bruises/bleeds easily.  Psychiatric/Behavioral: Negative for dysphoric mood.       Objective:   Physical Exam VS: see vs page GEN: no distress HEAD: head: no deformity eyes: no periorbital swelling, no proptosis external nose and ears are normal mouth: no lesion seen NECK: supple, thyroid is not enlarged CHEST WALL: no deformity LUNGS: clear  to auscultation BREASTS:  No gynecomastia CV: reg rate and rhythm, no murmur ABD: abdomen is soft, nontender.  no hepatosplenomegaly.  not distended.  no hernia GENITALIA/RECTAL/PROSTATE: sees urology MUSCULOSKELETAL: muscle  bulk and strength are grossly normal.  no obvious joint swelling.  gait is normal and steady PULSES: no carotid bruit NEURO:  cn 2-12 grossly intact.   readily moves all 4's.  SKIN:  Normal texture and temperature.  No rash or suspicious lesion is visible.  Insulin infusion sites at the anterior abdomen are normal.  NODES:  None palpable at the neck PSYCH: alert, well-oriented.  Does not appear anxious nor depressed.     Assessment & Plan:  Wellness visit today, with problems stable, except as noted. please consider these measures for your health:  minimize alcohol.  do not use tobacco products.  have a colonoscopy at least every 10 years from age 7. keep firearms safely stored.  always use seat belts.  have working smoke alarms in your home.  see an eye doctor and dentist regularly.  never drive under the influence of alcohol or drugs (including prescription drugs).  those with fair skin should take precautions against the sun.  it is critically important to prevent falling down (keep floor areas well-lit, dry, and free of loose objects.  If you have a cane, walker, or wheelchair, you should use it, even for short trips around the house.  Wear flat-soled shoes.  Also, try not to rush)    SEPARATE EVALUATION FOLLOWS--EACH PROBLEM HERE IS NEW, NOT RESPONDING TO TREATMENT, OR POSES SIGNIFICANT RISK TO THE PATIENT'S HEALTH: HISTORY OF THE PRESENT ILLNESS: Pt returns for f/u of diabetes mellitus: DM type: Insulin-requiring type 2 Dx'ed: 1610 Complications: polyneuropathy and renal insufficiency.  Therapy: insulin since 1995 DKA: never Severe hypoglycemia: never. Pancreatitis: never Other: he started pump therapy in mid-2015; he declines continuous glucose  monitor Interval history: He takes these settings: basal rate of 1.5 units/hr, 24 HRS mealtime bolus of 1 unit/7 grams carbohydrate correction bolus (which some people call "sensitivity," or "insulin sensitivity ratio," or just "isr") of 1 unit for each 10 by which your glucose exceeds 100.  Meter is downloaded, and the printout is scanned into the record.  cbg varies from 70-370.  It is in general lowest in the afternoon, and fasting He averages approx 80 total units per day.   PAST MEDICAL HISTORY reviewed and up to date today REVIEW OF SYSTEMS: He denies hypoglycemia PHYSICAL EXAMINATION: VITAL SIGNS:  See vs page GENERAL: no distress Pulses: dorsalis pedis intact bilat.   MSK: no deformity of the feet CV: trace bilat leg edema Skin:  no ulcer on the feet.  normal color and temp on the feet. Neuro: sensation is intact to touch on the feet, but decreased from normal LAB/XRAY RESULTS: i personally reviewed electrocardiogram tracing (today): Indication: HTN Impression: 1 degree AVB Lab Results  Component Value Date   HGBA1C 7.3 03/31/2016  IMPRESSION: Insulin-requiring type 2 DM: The pattern of his cbg's indicates he needs some adjustment in his therapy Bradycardia, new PLAN:  Please reduce your basal rate of 1.4 units/hr, 24 hrs per day.   increase the mealtime bolus to 1 unit/6 grams carbohydrate.  However, subtract 6 units from your calculated lunch bolus.  continue correction bolus (which some people call "sensitivity," or "insulin sensitivity ratio," or just "isr") of 1 unit for each 10 by which your glucose exceeds 100.  check your blood sugar 6 times a day.  vary the time of day when you check, between before the 3 meals, and at bedtime.  also check if you have symptoms of your blood sugar being too high or too low.  please  keep a record of the readings and bring it to your next appointment here.  You can write it on any piece of paper.  please call us sooner if your blood  sugar goes below 70, or if you have a lot of readings over 200.   Please reduce the coreg to 1/2 pill, twice a day. Renato Shin, MD

## 2016-03-31 NOTE — Patient Instructions (Addendum)
Please reduce your basal rate of 1.4 units/hr, 24 hrs per day.   increase the mealtime bolus to 1 unit/6 grams carbohydrate.  However, subtract 6 units from your calculated lunch bolus.  continue correction bolus (which some people call "sensitivity," or "insulin sensitivity ratio," or just "isr") of 1 unit for each 10 by which your glucose exceeds 100.  check your blood sugar 6 times a day.  vary the time of day when you check, between before the 3 meals, and at bedtime.  also check if you have symptoms of your blood sugar being too high or too low.  please keep a record of the readings and bring it to your next appointment here.  You can write it on any piece of paper.  please call us sooner if your blood sugar goes below 70, or if you have a lot of readings over 200.   Please reduce the coreg to 1/2 pill, twice a day. please consider these measures for your health:  minimize alcohol.  do not use tobacco products.  have a colonoscopy at least every 10 years from age 2.  keep firearms safely stored.  always use seat belts.  have working smoke alarms in your home.  see an eye doctor and dentist regularly.  never drive under the influence of alcohol or drugs (including prescription drugs).  those with fair skin should take precautions against the sun. it is critically important to prevent falling down (keep floor areas well-lit, dry, and free of loose objects.  If you have a cane, walker, or wheelchair, you should use it, even for short trips around the house.  Wear flat-soled shoes.  Also, try not to rush) Please come back for a follow-up appointment in 3 months.

## 2016-04-20 ENCOUNTER — Encounter: Payer: Self-pay | Admitting: Endocrinology

## 2016-04-21 ENCOUNTER — Other Ambulatory Visit: Payer: Self-pay | Admitting: Endocrinology

## 2016-04-21 ENCOUNTER — Encounter: Payer: Self-pay | Admitting: Endocrinology

## 2016-04-21 MED ORDER — ACCU-CHEK AVIVA PLUS W/DEVICE KIT: PACK | Status: AC

## 2016-05-08 ENCOUNTER — Encounter: Payer: Self-pay | Admitting: Endocrinology

## 2016-05-08 LAB — HM DIABETES EYE EXAM

## 2016-05-25 ENCOUNTER — Other Ambulatory Visit: Payer: Self-pay | Admitting: Endocrinology

## 2016-06-30 ENCOUNTER — Ambulatory Visit (INDEPENDENT_AMBULATORY_CARE_PROVIDER_SITE_OTHER): Payer: Medicare Other | Admitting: Endocrinology

## 2016-06-30 ENCOUNTER — Encounter: Payer: Self-pay | Admitting: Endocrinology

## 2016-06-30 VITALS — BP 136/84 | HR 74 | Ht 73.0 in | Wt 270.0 lb

## 2016-06-30 DIAGNOSIS — Z794 Long term (current) use of insulin: Secondary | ICD-10-CM | POA: Diagnosis not present

## 2016-06-30 DIAGNOSIS — Z23 Encounter for immunization: Secondary | ICD-10-CM

## 2016-06-30 DIAGNOSIS — M5416 Radiculopathy, lumbar region: Secondary | ICD-10-CM | POA: Diagnosis not present

## 2016-06-30 DIAGNOSIS — N183 Chronic kidney disease, stage 3 (moderate): Secondary | ICD-10-CM | POA: Diagnosis not present

## 2016-06-30 DIAGNOSIS — E1122 Type 2 diabetes mellitus with diabetic chronic kidney disease: Secondary | ICD-10-CM

## 2016-06-30 DIAGNOSIS — M5417 Radiculopathy, lumbosacral region: Secondary | ICD-10-CM | POA: Insufficient documentation

## 2016-06-30 LAB — POCT GLYCOSYLATED HEMOGLOBIN (HGB A1C): HEMOGLOBIN A1C: 7.4

## 2016-06-30 MED ORDER — INSULIN LISPRO 100 UNIT/ML ~~LOC~~ SOLN
SUBCUTANEOUS | 3 refills | Status: DC
Start: 2016-06-30 — End: 2016-12-28

## 2016-06-30 NOTE — Progress Notes (Signed)
we discussed code status.  pt requests full code, but would not want to be started or maintained on artificial life-support measures if there was not a reasonable chance of recovery 

## 2016-06-30 NOTE — Progress Notes (Signed)
Subjective:    Patient ID: [name], male    DOB: 08/20/1941, 75 y.o.   MRN: [id number]  HPI Pt returns for f/u of diabetes mellitus: DM type: Insulin-requiring type 2 Dx'ed: 5638 Complications: polyneuropathy and renal insufficiency.  Therapy: insulin since 1995 DKA: never Severe hypoglycemia: never. Pancreatitis: never.  Other: he started pump therapy in mid-2015; he declines continuous glucose monitor.  Interval history: He takes these settings:  basal rate of 1.4 units/hr, 24 HRS a day   mealtime bolus of 1 unit/6 grams carbohydrate, except subtracts 6 units from calculated lunch bolus.  correction bolus (which some people call "sensitivity," or "insulin sensitivity ratio," or just "isr") of 1 unit for each 10 by which glucose exceeds 100.  Meter is downloaded, and the printout is scanned into the record.  cbg varies from 70-350.  It is in general lowest in the afternoon, and highest at hs.  He averages approx 90 total units per day.  Past Medical History:  Diagnosis Date  . Allergy   . Anal fissure   . Anemia, unspecified   . Arthritis    Spinal OA  . BACK PAIN, LUMBAR 10/23/2007  . CARDIAC MURMUR, SYSTOLIC 06/25/7341  . DIABETES MELLITUS, TYPE I 05/20/2007  . DIASTOLIC DYSFUNCTION 8/76/8115  . 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[place])   . Duodenitis   . Edema 05/12/2008  . Esophageal stricture   . GERD (gastroesophageal reflux disease)   . 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 Gynecomastia   . Hepatic steatosis   . HYPERLIPIDEMIA 05/20/2007  . Hypertension   . Hypogonadism male   . Morbid obesity ([place]) 09/10/2009  . OBSTRUCTIVE SLEEP APNEA 11/04/2007  . Personal history of colonic polyps   . RENAL INSUFFICIENCY 05/12/2008  . Sleep apnea    uses cpap  . Urolithiasis     Past Surgical History:  Procedure Laterality Date  . Abdominal US  09/1997  . arm fracture Left 1958   with hardware  . Colon cancer screening    . COLONOSCOPY  08/18/2004   diverticulitis  .  COLONOSCOPY  09/24/2009  . ELECTROCARDIOGRAM  02/2006  . FLEXIBLE SIGMOIDOSCOPY  03/04/2001   polyps, anal fissure  . GANGLION CYST EXCISION Left 1980  . Lower Arterial  04/13/2004  . POLYPECTOMY    . Rest Cardiolite  03/19/2003    Social History   Social History  . Marital status: Married    Spouse name: N/A  . Number of children: N/A  . Years of education: N/A   Occupational History  .  [employer]   Social History Main Topics  . Smoking status: Former Smoker    Packs/day: 2.00    Years: 31.00    Types: Cigarettes    Quit date: 10/23/1982  . Smokeless tobacco: Never Used  . Alcohol use No  . Drug use: No  . Sexual activity: Not on file   Other Topics Concern  . Not on file   Social History Narrative   Lives with wife in a one story home.  Has a son and a daughter.     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     Education: 2 years of college.       Current Outpatient Prescriptions on File Prior to Visit  Medication Sig Dispense Refill  . ACCU-CHEK FASTCLIX LANCETS MISC Test 4 times daily as  directed 408 each 2  . alfuzosin (UROXATRAL) 10 MG 24 hr tablet Take 1 tablet by  mouth  daily with breakfast 90 tablet 2  . allopurinol (ZYLOPRIM) 300 MG tablet Take 1 tablet by mouth  daily 90 tablet 2  . aspirin (BAYER LOW STRENGTH) 81 MG EC tablet Take 81 mg by mouth daily.      . Blood Glucose Monitoring Suppl (ACCU-CHEK AVIVA PLUS) w/Device KIT Use as directed 1 kit 0  . carvedilol (COREG) 3.125 MG tablet Take 0.5 tablets (1.5625 mg total) by mouth 2 (two) times daily with a meal. 90 tablet 3  . celecoxib (CELEBREX) 200 MG capsule Take 1 capsule by mouth  daily 90 capsule 1  . cycloSPORINE (RESTASIS) 0.05 % ophthalmic emulsion Place 1 drop into both eyes 2 (two) times daily.      . fluticasone (FLONASE) 50 MCG/ACT nasal spray Place 1 spray into the nose daily as needed for allergies.     . folic acid (FOLVITE) 1 MG tablet Take 2 mg by mouth 2 (two) times  daily. 4 tabs daily    . furosemide (LASIX) 20 MG tablet Take 1 tablet by mouth  daily 90 tablet 1  . glucose blood (ACCU-CHEK AVIVA PLUS) test strip Use to test blood sugar 6  times per day 550 each 1  . ibuprofen (ADVIL,MOTRIN) 600 MG tablet Take 1 tablet (600 mg total) by mouth every 8 (eight) hours as needed. 15 tablet 0  . Insulin Infusion Pump Supplies (ACCU-CHEK PLASTIC CARTRIDGE) MISC Use as directed per insulin pump 15 each 3  . Insulin Infusion Pump Supplies (ACCU-CHEK RAPID-D INFUSION SET) MISC Use per insulin pump 15 each 2  . mometasone (ELOCON) 0.1 % lotion Apply topically daily. 60 mL 1  . Multiple Vitamins-Minerals (CENTRUM SILVER PO) Take 1 tablet by mouth daily.      . naproxen (NAPROSYN) 500 MG tablet take 1 tablet by mouth twice a day after meals if needed  0  . Omega-3 Fatty Acids (FISH OIL) 1200 MG CAPS Take 1,200 mg by mouth daily.     Marland Kitchen omeprazole (PRILOSEC) 20 MG capsule Take 1 capsule by mouth  daily as needed 90 capsule 2  . rosuvastatin (CRESTOR) 40 MG tablet Take 1 tablet by mouth at  bedtime 90 tablet 2  . vitamin B-12 (CYANOCOBALAMIN) 1000 MCG tablet Take 1,000 mcg by mouth daily.     No current facility-administered medications on file prior to visit.     Allergies  Allergen Reactions  . Atorvastatin Other (See Comments)    unknown  . Niacin     REACTION: Severe heartburn  . Pioglitazone     REACTION: Edema    Family History  Problem Relation Age of Onset  . Colon cancer Brother 23  . Colon polyps Brother   . Cancer Brother     lung cancer, deceased  . Healthy Daughter   . Healthy Son   . Other Mother     MVA, deceased 71s  . Rectal cancer Neg Hx   . Stomach cancer Neg Hx     BP 136/84   Pulse 74   Ht '6\' 1"'  (1.854 m)   Wt 270 lb (122.5 kg)   SpO2 98%   BMI 35.62 kg/m    Review of Systems He denies hypoglycemia.   He has pain rad from the lower back to the lateral aspect of the left thigh    Objective:   Physical Exam VITAL SIGNS:   See vs page GENERAL: no distress Pulses: dorsalis pedis intact bilat.   MSK: no deformity of the  feet CV: 1+ bilat leg edema Skin:  no ulcer on the feet.  normal color and temp on the feet. Neuro: sensation is intact to touch on the feet, but decreased from normal   A1c=7.4%    Assessment & Plan:  Insulin-requiring type 2 DM: The pattern of his cbg's indicates he needs some adjustment in his therapy Radicular pain, new to me.  Subjective:   Patient here for Medicare annual wellness visit and management of other chronic and acute problems.     Risk factors: advanced age    25 of Physicians Providing Medical Care to Patient:  See "snapshot"   Activities of Daily Living: In your present state of health, do you have any difficulty performing the following activities?:  Preparing food and eating?: No  Bathing yourself: No  Getting dressed: No  Using the toilet:No  Moving around from place to place: No  In the past year have you fallen or had a near fall?: No    Home Safety: Has smoke detector and wears seat belts. Firearms are safely stored. No excess sun exposure.   Diet and Exercise  Current exercise habits: pt says "fair." Dietary issues discussed: pt reports a healthy diet   Depression Screen  Q1: Over the past two weeks, have you felt down, depressed or hopeless? no  Q2: Over the past two weeks, have you felt little interest or pleasure in doing things? no   The following portions of the patient's history were reviewed and updated as appropriate: allergies, current medications, past family history, past medical history, past social history, past surgical history and problem list.   Review of Systems  No change in chronic hearing loss (declines hearing aids); no visual loss Objective:   Vision:  Sees opthalmologist dr Sharren Bridge, so he declines [facility] today.  Hearing: grossly normal Body mass index:  See vs page Msk: pt easily and quickly performs "get-up-and-go" from a  sitting position.  Cognitive Impairment Assessment: cognition, memory and judgment appear normal.  remembers 3/3 at 5 minutes.  excellent recall.  can easily read and write a sentence.  alert and oriented x 3.    Assessment:   Medicare wellness utd on preventive parameters    Plan:   During the course of the visit the patient was educated and counseled about appropriate screening and preventive services including:        Fall prevention    Diabetes screening  Nutrition counseling   Vaccines / LABS Zostavax / Pneumococcal Vaccine  today   Patient Instructions (the written plan) was given to the patient.

## 2016-06-30 NOTE — Patient Instructions (Addendum)
Please reduce your basal rate of 1.4 units/hr, 24 hrs per day.   increase the mealtime bolus to 1 unit/5 grams carbohydrate.  However, subtract 12 units from your calculated lunch bolus.  continue correction bolus (which some people call "sensitivity," or "insulin sensitivity ratio," or just "isr") of 1 unit for each 10 by which your glucose exceeds 100.  good diet and exercise significantly improve the control of your diabetes.  please let me know if you wish to be referred to a dietician.  high blood sugar is very risky to your health.  you should see an eye doctor and dentist every year.  It is very important to get all recommended vaccinations.  Please consider these measures for your health:  minimize alcohol.  Do not use tobacco products.  Have a colonoscopy at least every 10 years from age 37.  Keep firearms safely stored.  Always use seat belts.  have working smoke alarms in your home.  See an eye doctor and dentist regularly.  Never drive under the influence of alcohol or drugs (including prescription drugs).  Those with fair skin should take precautions against the sun, and should carefully examine their skin once per month, for any new or changed moles. It is critically important to prevent falling down (keep floor areas well-lit, dry, and free of loose objects.  If you have a cane, walker, or wheelchair, you should use it, even for short trips around the house.  Wear flat-soled shoes.  Also, try not to rush).  check your blood sugar 6 times a day.  vary the time of day when you check, between before the 3 meals, and at bedtime.  also check if you have symptoms of your blood sugar being too high or too low.  please keep a record of the readings and bring it to your next appointment here.  You can write it on any piece of paper.  please call us sooner if your blood sugar goes below 70, or if you have a lot of readings over 200.   Please see a PMR specialist.  you will receive a phone call, about a  day and time for an appointment Please come back for a follow-up appointment in 3 months

## 2016-07-06 ENCOUNTER — Other Ambulatory Visit: Payer: Self-pay | Admitting: Endocrinology

## 2016-08-08 ENCOUNTER — Other Ambulatory Visit: Payer: Self-pay | Admitting: Endocrinology

## 2016-08-31 ENCOUNTER — Encounter: Payer: Self-pay | Admitting: Physical Medicine & Rehabilitation

## 2016-08-31 ENCOUNTER — Encounter: Payer: Medicare Other | Attending: Physical Medicine & Rehabilitation | Admitting: Physical Medicine & Rehabilitation

## 2016-08-31 VITALS — BP 121/76 | HR 85 | Resp 14

## 2016-08-31 DIAGNOSIS — G4733 Obstructive sleep apnea (adult) (pediatric): Secondary | ICD-10-CM | POA: Diagnosis not present

## 2016-08-31 DIAGNOSIS — M791 Myalgia, unspecified site: Secondary | ICD-10-CM

## 2016-08-31 DIAGNOSIS — K219 Gastro-esophageal reflux disease without esophagitis: Secondary | ICD-10-CM | POA: Diagnosis not present

## 2016-08-31 DIAGNOSIS — Z87891 Personal history of nicotine dependence: Secondary | ICD-10-CM | POA: Insufficient documentation

## 2016-08-31 DIAGNOSIS — I129 Hypertensive chronic kidney disease with stage 1 through stage 4 chronic kidney disease, or unspecified chronic kidney disease: Secondary | ICD-10-CM | POA: Insufficient documentation

## 2016-08-31 DIAGNOSIS — G479 Sleep disorder, unspecified: Secondary | ICD-10-CM

## 2016-08-31 DIAGNOSIS — M109 Gout, unspecified: Secondary | ICD-10-CM | POA: Diagnosis not present

## 2016-08-31 DIAGNOSIS — N189 Chronic kidney disease, unspecified: Secondary | ICD-10-CM | POA: Insufficient documentation

## 2016-08-31 DIAGNOSIS — M461 Sacroiliitis, not elsewhere classified: Secondary | ICD-10-CM | POA: Diagnosis not present

## 2016-08-31 DIAGNOSIS — E1042 Type 1 diabetes mellitus with diabetic polyneuropathy: Secondary | ICD-10-CM | POA: Diagnosis not present

## 2016-08-31 DIAGNOSIS — M545 Low back pain: Secondary | ICD-10-CM | POA: Diagnosis not present

## 2016-08-31 DIAGNOSIS — E785 Hyperlipidemia, unspecified: Secondary | ICD-10-CM | POA: Diagnosis not present

## 2016-08-31 DIAGNOSIS — M542 Cervicalgia: Secondary | ICD-10-CM | POA: Diagnosis not present

## 2016-08-31 DIAGNOSIS — G8929 Other chronic pain: Secondary | ICD-10-CM

## 2016-08-31 DIAGNOSIS — E1022 Type 1 diabetes mellitus with diabetic chronic kidney disease: Secondary | ICD-10-CM | POA: Diagnosis not present

## 2016-08-31 DIAGNOSIS — M1990 Unspecified osteoarthritis, unspecified site: Secondary | ICD-10-CM | POA: Diagnosis not present

## 2016-08-31 DIAGNOSIS — E1142 Type 2 diabetes mellitus with diabetic polyneuropathy: Secondary | ICD-10-CM

## 2016-08-31 MED ORDER — METHOCARBAMOL 500 MG PO TABS: 500.0000 mg | ORAL_TABLET | Freq: Three times a day (TID) | ORAL | 1 refills | Status: DC | PRN

## 2016-08-31 MED ORDER — GABAPENTIN 100 MG PO CAPS: 100.0000 mg | ORAL_CAPSULE | Freq: Every day | ORAL | 1 refills | Status: DC

## 2016-08-31 MED ORDER — DULOXETINE HCL 30 MG PO CPEP: 30.0000 mg | ORAL_CAPSULE | Freq: Every day | ORAL | 0 refills | Status: DC

## 2016-08-31 NOTE — Progress Notes (Signed)
Subjective:    Patient ID: [name], male    DOB: [birth year], 75 y.o.   MRN: [id number]  HPI  75 y/o male with pmh of OSA, CKD, HTN, DM type 1 with peripheral neuropathy presents with low back pain > neck pain.  Bilateral L>R back pain.  Started ~>20 years ago.  No inciting event.  Sitting/laying improve the pain.  Standing exacerbates the pain.  Sharp, burning pain.  Radiates to left posterior thigh.  Intermittent.  He has associated numbness/tingling.  He only tried ASA, which helps some.  Pain limits from doing ADLs and fishing.  He denies falls.  Pain Inventory Average Pain 7 Pain Right Now 6 My pain is sharp, burning and tingling  In the last 24 hours, has pain interfered with the following? General activity 6 Relation with others 0 Enjoyment of life 3 What TIME of day is your pain at its worst? morning Sleep (in general) Fair  Pain is worse with: walking, bending, standing and some activites Pain improves with: rest Relief from Meds: 5  Mobility walk without assistance how many minutes can you walk? 15-30 ability to climb steps?  yes do you drive?  yes  Function retired  Neuro/Psych weakness tingling trouble walking  Prior Studies x-rays CT/MRI nerve study new visit  Physicians involved in your care Primary care Dr. Loanne Drilling Neurologist Dr. Erlinda Hong new visit   Family History  Problem Relation Age of Onset  . Colon cancer Brother 30  . Colon polyps Brother   . Cancer Brother     lung cancer, deceased  . Other Mother     MVA, deceased 54s  . Healthy Daughter   . Healthy Son   . Rectal cancer Neg Hx   . Stomach cancer Neg Hx    Social History   Social History  . Marital status: Married    Spouse name: N/A  . Number of children: N/A  . Years of education: N/A   Occupational History  .  [employer]   Social History Main Topics  . Smoking status: Former Smoker    Packs/day: 2.00    Years: 31.00    Types: Cigarettes    Quit date:  10/23/1982  . Smokeless tobacco: Never Used  . Alcohol use No  . Drug use: No  . Sexual activity: Not Asked   Other Topics Concern  . None   Social History Narrative   Lives with wife in a one story home.  Has a son and a daughter.     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     Education: 2 years of college.      Past Surgical History:  Procedure Laterality Date  . Abdominal US  09/1997  . arm fracture Left 1958   with hardware  . Colon cancer screening    . COLONOSCOPY  08/18/2004   diverticulitis  . COLONOSCOPY  09/24/2009  . ELECTROCARDIOGRAM  02/2006  . FLEXIBLE SIGMOIDOSCOPY  03/04/2001   polyps, anal fissure  . GANGLION CYST EXCISION Left 1980  . Lower Arterial  04/13/2004  . POLYPECTOMY    . Rest Cardiolite  03/19/2003   Past Medical History:  Diagnosis Date  . Allergy   . Anal fissure   . Anemia, unspecified   . Arthritis    Spinal OA  . BACK PAIN, LUMBAR 10/23/2007  . CARDIAC MURMUR, SYSTOLIC 05/27/1323  . DIABETES MELLITUS, TYPE I 05/20/2007  . DIASTOLIC DYSFUNCTION 01/22/271  . DM type  1 with diabetic peripheral neuropathy ([place])   . Duodenitis   . Edema 05/12/2008  . Esophageal stricture   . GERD (gastroesophageal reflux disease)   . 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 Gynecomastia   . Hepatic steatosis   . HYPERLIPIDEMIA 05/20/2007  . Hypertension   . Hypogonadism male   . Morbid obesity ([place]) 09/10/2009  . OBSTRUCTIVE SLEEP APNEA 11/04/2007  . Personal history of colonic polyps   . RENAL INSUFFICIENCY 05/12/2008  . Sleep apnea    uses cpap  . Urolithiasis    BP 121/76 (BP Location: Right Arm, Patient Position: Sitting, Cuff Size: Large)   Pulse 85   Resp 14   SpO2 95%   Opioid Risk Score:   Fall Risk Score:  `1  Depression screen PHQ 2/9  Depression screen PHQ 2/9 08/31/2016  Decreased Interest 0  Down, Depressed, Hopeless 0  PHQ - 2 Score 0  Altered sleeping 0  Tired, decreased energy 1  Change in appetite 0  Feeling bad or failure about  yourself  0  Trouble concentrating 0  Moving slowly or fidgety/restless 0  Suicidal thoughts 0  PHQ-9 Score 1  Difficult doing work/chores Somewhat difficult    Review of Systems  Constitutional: Positive for diaphoresis.  HENT: Negative.   Eyes: Negative.   Respiratory: Negative.   Cardiovascular: Negative.   Gastrointestinal: Negative.   Endocrine:       Diabetic High blood sugar  Genitourinary: Positive for dysuria.       Retention   Musculoskeletal: Positive for arthralgias, back pain, gait problem, myalgias and neck pain.  Allergic/Immunologic: Negative.   Neurological: Positive for weakness.       Tingling  Hematological: Negative.   Psychiatric/Behavioral: Negative.   All other systems reviewed and are negative.     Objective:   Physical Exam Gen: NAD. Vital signs reviewed HENT: Normocephalic, Atraumatic Eyes: EOMI. No discharge.  Cardio: S1, S2 normal, RRR. No JVD. Pulm: B/l clear to auscultation.  Effort normal Abd: Soft, non-distended, non-tender, BS+ MSK:  Gait WNL.   TTP mildly along b/l SI joints.    No edema.   Neg FABERs.   Neg lumbar grind test  ROM WNL Neuro: CN II-XII grossly intact.    Sensation diminished to light touch b/l LE >feet. Reflexes 2+ throughout  Strength  5/5 in all LE myotomes  SLR Neg b/l Skin: Warm and Dry    Assessment & Plan:  75 y/o male with pmh of OSA, CKD, HTN, DM type 1 with peripheral neuropathy presents with low back pain > neck pain.    1. Chronic mechanical low back pain  MRIs C,L-spine early 2016 suggesting multilevel spondylosis with mild b/l multilevel foraminal narrowing C4-C7 and shallow disc bulge at L5-S1 without central canal or foraminal stenosis.  Avoid ASA due to CKD  Will order PT with eval and treat with consideration for low back pain core/quad strengthening and TENs unit  Encouraged aquatic therapy  Cont tylenol  Will order Robaxin 500 TID PRN  Will order Cymbalta 30mg , will consider increase to  60mg  on next visit  Will order Gabapentin 100qhs  Will consider bracing in future  Will consider injections in future  2. Sacroiliitis  Will consider joint belt in future  Will consider injections in future  3. Sleep disturbance  Pt recently with new mattress  Cont CPAP  4. Myalgia  Will consider trigger point injection to sacral PSPs in future  5. Diabetic neuropathy  Cymbalta ordered  Gabapentin ordered  6. Morbid obesity  Pt not interested in seeing dietitian at this time

## 2016-09-05 ENCOUNTER — Telehealth: Payer: Self-pay | Admitting: Endocrinology

## 2016-09-05 ENCOUNTER — Encounter: Payer: Medicare Other | Attending: Endocrinology | Admitting: Nutrition

## 2016-09-05 ENCOUNTER — Other Ambulatory Visit: Payer: Self-pay | Admitting: Endocrinology

## 2016-09-05 DIAGNOSIS — Z4681 Encounter for fitting and adjustment of insulin pump: Secondary | ICD-10-CM | POA: Diagnosis not present

## 2016-09-05 DIAGNOSIS — Z794 Long term (current) use of insulin: Secondary | ICD-10-CM | POA: Insufficient documentation

## 2016-09-05 DIAGNOSIS — N183 Chronic kidney disease, stage 3 (moderate): Principal | ICD-10-CM

## 2016-09-05 DIAGNOSIS — E119 Type 2 diabetes mellitus without complications: Secondary | ICD-10-CM | POA: Diagnosis not present

## 2016-09-05 DIAGNOSIS — E0800 Diabetes mellitus due to underlying condition with hyperosmolarity without nonketotic hyperglycemic-hyperosmolar coma (NKHHC): Secondary | ICD-10-CM

## 2016-09-05 DIAGNOSIS — E1122 Type 2 diabetes mellitus with diabetic chronic kidney disease: Secondary | ICD-10-CM

## 2016-09-05 NOTE — Telephone Encounter (Signed)
Please increase your basal rate to 2 units/hr, 24 hrs per day.   increase the mealtime bolus to 1 unit/4 grams carbohydrate.   continue correction bolus (which some people call "sensitivity," or "insulin sensitivity ratio," or just "isr") of 1 unit for each 10 by which your glucose exceeds 100.  Please call 2-3 days, to report cbg's

## 2016-09-06 NOTE — Telephone Encounter (Signed)
See office note.  Pump changes made as per order from Dr. Loanne Drilling

## 2016-09-08 NOTE — Progress Notes (Signed)
Patient is here today to review blood sugars, because they are running high, despite no changes to his diet, activity, health, or insulin dose.   Pt. Is testing 4-5 times/day and pump download shows that all readings are in the low 200s-low with occasional 300s, despite doing a correction bolus at each reading. Dr. Loanne Drilling showed the download and changes made to basal rate, per his written instructions: Basal rate:  1.4u/hr. to 2.0u/hr. Patient was told to call if blood sugars do not come down, or drop low We reviewed low blood sugar treatments, and he reported good understanding of this.

## 2016-09-08 NOTE — Patient Instructions (Addendum)
Call if blood sugars do not come down, or drop low.

## 2016-09-10 ENCOUNTER — Other Ambulatory Visit: Payer: Self-pay | Admitting: Endocrinology

## 2016-09-14 ENCOUNTER — Other Ambulatory Visit: Payer: Self-pay | Admitting: Endocrinology

## 2016-09-21 ENCOUNTER — Ambulatory Visit (INDEPENDENT_AMBULATORY_CARE_PROVIDER_SITE_OTHER): Payer: Medicare Other | Admitting: Orthopaedic Surgery

## 2016-09-21 ENCOUNTER — Ambulatory Visit (INDEPENDENT_AMBULATORY_CARE_PROVIDER_SITE_OTHER): Payer: Medicare Other

## 2016-09-21 ENCOUNTER — Other Ambulatory Visit: Payer: Self-pay | Admitting: Endocrinology

## 2016-09-21 ENCOUNTER — Encounter (INDEPENDENT_AMBULATORY_CARE_PROVIDER_SITE_OTHER): Payer: Self-pay | Admitting: Orthopaedic Surgery

## 2016-09-21 DIAGNOSIS — M25561 Pain in right knee: Secondary | ICD-10-CM

## 2016-09-21 DIAGNOSIS — G8929 Other chronic pain: Secondary | ICD-10-CM

## 2016-09-21 NOTE — Progress Notes (Signed)
Office Visit Note   Patient: [name]           Date of Birth: 08-10-1941           MRN: [id number] Visit Date: 09/21/2016              Requested by: Renato Shin, MD [address] [place], [place] [address] PCP: Renato Shin, MD   Assessment & Plan: Visit Diagnoses:  1. Chronic pain of right knee     Plan: Right knee cortisone injection was given today under sterile conditions patient tolerated this well he should give [place] a call in a couple weeks it is not better and we'll order an MRI.  Follow-Up Instructions: Return if symptoms worsen or fail to improve.   Orders:  Orders Placed This Encounter  Procedures  . XR KNEE 3 VIEW RIGHT   No orders of the defined types were placed in this encounter.     Procedures: Large Joint Inj Date/Time: 09/21/2016 3:50 PM Performed by: Leandrew Koyanagi Authorized by: Leandrew Koyanagi   Consent Given by:  Patient Timeout: prior to procedure the correct patient, procedure, and site was verified   Indications:  Pain Location:  Knee Site:  R knee Prep: patient was prepped and draped in usual sterile fashion   Needle Size:  22 G Ultrasound Guidance: No   Fluoroscopic Guidance: No   Arthrogram: No   Patient tolerance:  Patient tolerated the procedure well with no immediate complications     Clinical Data: No additional findings.   Subjective: Chief Complaint  Patient presents with  . Right Knee - Pain    [name] returns today for worsening right knee pain. He had an injury 3 weeks ago where his knee locked up and popped. He is having medial joint line tenderness and pain. He has occasional mechanical symptoms. He has had an injection before with good relief.    Review of Systems   Objective: Vital Signs: There were no vitals taken for this visit.  Physical Exam  Ortho Exam Exam of the right knee shows medial joint line tenderness. Negative McMurray negative Thessaly. Specialty Comments:  No specialty  comments available.  Imaging: Xr Knee 3 View Right  Result Date: 09/21/2016 No acute findings. Minimal joint line narrowing.    PMFS History: Patient Active Problem List   Diagnosis Date Noted  . Radiculitis, lumbosacral 06/30/2016  . Diabetes ([place]) 01/01/2016  . Solitary pulmonary nodule 12/30/2015  . Urolithiasis 02/23/2015  . Hearing loss 11/25/2014  . Paresthesia 09/07/2014  . Routine general medical examination at a health care facility 07/07/2014  . Disturbance of skin sensation 12/26/2013  . UTI (urinary tract infection) 03/12/2013  . Screening for prostate cancer 02/14/2012  . Right knee pain 11/15/2011  . Encounter for long-term (current) use of other medications 01/17/2011  . Special screening examination for neoplasm of prostate 01/17/2011  . MYCOSIS FUNGOIDES 12/29/2009  . HEARING LOSS 09/30/2009  . ECZEMA 09/30/2009  . VITAMIN B12 DEFICIENCY 09/10/2009  . MORBID OBESITY 09/10/2009  . SYNCOPE 05/03/2009  . DIASTOLIC DYSFUNCTION 18/84/1660  . CARDIAC MURMUR, SYSTOLIC 63/10/6008  . Pancytopenia ([place]) 05/12/2008  . Disorder resulting from impaired renal function 05/12/2008  . EDEMA 05/12/2008  . Obstructive sleep apnea 11/04/2007  . BACK PAIN, LUMBAR 10/23/2007  . Dyslipidemia 05/20/2007  . 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 Essential hypertension 05/20/2007   Past Medical History:  Diagnosis Date  . Allergy   . Anal fissure   .  Anemia, unspecified   . Arthritis    Spinal OA  . BACK PAIN, LUMBAR 10/23/2007  . CARDIAC MURMUR, SYSTOLIC 0/06/9832  . DIABETES MELLITUS, TYPE I 05/20/2007  . DIASTOLIC DYSFUNCTION 06/16/538  . 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[place])   . Duodenitis   . Edema 05/12/2008  . Esophageal stricture   . GERD (gastroesophageal reflux disease)   . 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 Gynecomastia   . Hepatic steatosis   . HYPERLIPIDEMIA 05/20/2007  . Hypertension   . Hypogonadism male   . Morbid obesity ([place]) 09/10/2009  . OBSTRUCTIVE SLEEP APNEA  11/04/2007  . Personal history of colonic polyps   . RENAL INSUFFICIENCY 05/12/2008  . Sleep apnea    uses cpap  . Urolithiasis     Family History  Problem Relation Age of Onset  . Colon cancer Brother 25  . Colon polyps Brother   . Cancer Brother     lung cancer, deceased  . Other Mother     MVA, deceased 36s  . Healthy Daughter   . Healthy Son   . Rectal cancer Neg Hx   . Stomach cancer Neg Hx     Past Surgical History:  Procedure Laterality Date  . Abdominal US  09/1997  . arm fracture Left 1958   with hardware  . Colon cancer screening    . COLONOSCOPY  08/18/2004   diverticulitis  . COLONOSCOPY  09/24/2009  . ELECTROCARDIOGRAM  02/2006  . FLEXIBLE SIGMOIDOSCOPY  03/04/2001   polyps, anal fissure  . GANGLION CYST EXCISION Left 1980  . Lower Arterial  04/13/2004  . POLYPECTOMY    . Rest Cardiolite  03/19/2003   Social History   Occupational History  .  [employer]   Social History Main Topics  . Smoking status: Former Smoker    Packs/day: 2.00    Years: 31.00    Types: Cigarettes    Quit date: 10/23/1982  . Smokeless tobacco: Never Used  . Alcohol use No  . Drug use: No  . Sexual activity: Not on file

## 2016-09-25 ENCOUNTER — Other Ambulatory Visit: Payer: Self-pay | Admitting: Physical Medicine & Rehabilitation

## 2016-09-25 ENCOUNTER — Encounter: Payer: Self-pay | Admitting: Physical Therapy

## 2016-09-25 ENCOUNTER — Ambulatory Visit: Payer: Medicare Other | Attending: Physical Medicine & Rehabilitation | Admitting: Physical Therapy

## 2016-09-25 DIAGNOSIS — M62830 Muscle spasm of back: Secondary | ICD-10-CM

## 2016-09-25 DIAGNOSIS — M5442 Lumbago with sciatica, left side: Secondary | ICD-10-CM | POA: Insufficient documentation

## 2016-09-25 DIAGNOSIS — G8929 Other chronic pain: Secondary | ICD-10-CM

## 2016-09-25 DIAGNOSIS — M542 Cervicalgia: Secondary | ICD-10-CM | POA: Diagnosis present

## 2016-09-25 NOTE — Therapy (Signed)
[place] [address] [place], [place], [address] Phone: [phone]   Fax:  [phone]  Physical Therapy Evaluation  Patient Details  Name: [name] MRN: [id number] Date of Birth: Aug 27, 1941 Referring Provider: Erlinda Hong  Encounter Date: 09/25/2016      PT End of Session - 09/25/16 0958    Visit Number 1   Date for PT Re-Evaluation 11/26/16   PT Start Time 0928   PT Stop Time 1020   PT Time Calculation (min) 52 min   Activity Tolerance Patient tolerated treatment well   Behavior During Therapy [facility] for tasks assessed/performed      Past Medical History:  Diagnosis Date  . Allergy   . Anal fissure   . Anemia, unspecified   . Arthritis    Spinal OA  . BACK PAIN, LUMBAR 10/23/2007  . CARDIAC MURMUR, SYSTOLIC 05/29/7671  . DIABETES MELLITUS, TYPE I 05/20/2007  . DIASTOLIC DYSFUNCTION 0/94/7096  . 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[place])   . Duodenitis   . Edema 05/12/2008  . Esophageal stricture   . GERD (gastroesophageal reflux disease)   . 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 Gynecomastia   . Hepatic steatosis   . HYPERLIPIDEMIA 05/20/2007  . Hypertension   . Hypogonadism male   . Morbid obesity ([place]) 09/10/2009  . OBSTRUCTIVE SLEEP APNEA 11/04/2007  . Personal history of colonic polyps   . RENAL INSUFFICIENCY 05/12/2008  . Sleep apnea    uses cpap  . Urolithiasis     Past Surgical History:  Procedure Laterality Date  . Abdominal US  09/1997  . arm fracture Left 1958   with hardware  . Colon cancer screening    . COLONOSCOPY  08/18/2004   diverticulitis  . COLONOSCOPY  09/24/2009  . ELECTROCARDIOGRAM  02/2006  . FLEXIBLE SIGMOIDOSCOPY  03/04/2001   polyps, anal fissure  . GANGLION CYST EXCISION Left 1980  . Lower Arterial  04/13/2004  . POLYPECTOMY    . Rest Cardiolite  03/19/2003    There were no vitals filed for this visit.       Subjective Assessment - 09/25/16 0932    Subjective Patient reports  that he has had some back pain for about 30 years.  He reports that he has had some increased LBP over the past 6 months with a new sciatic type pain, he reports that past x-rays show DDD.  He reports that he has been having difficulty with ADL's.   Limitations Sitting;Lifting;Standing;House hold activities   Patient Stated Goals be able to do more and have less pain   Currently in Pain? Yes   Pain Score 3    Pain Location Back   Pain Orientation Mid;Lower   Pain Descriptors / Indicators Aching   Pain Type Chronic pain   Pain Onset More than a month ago   Pain Frequency Constant   Aggravating Factors  washing dishes, standing, reaching out, will increase the pain to 9-10/10   Pain Relieving Factors rest will make the pain go down to 2/10   Effect of Pain on Daily Activities difficulty with ADL's            [facility] PT Assessment - 09/25/16 0001      Assessment   Medical Diagnosis LBP   Referring Provider Xu   Onset Date/Surgical Date 08/26/16   Prior Therapy no     Precautions   Precautions None     Balance Screen   Has the  patient fallen in the past 6 months No   Has the patient had a decrease in activity level because of a fear of falling?  No   Is the patient reluctant to leave their home because of a fear of falling?  No     Home Environment   Additional Comments does some yardwork and some housework     Prior Function   Level of Independence Independent   Vocation Retired   Leisure like to IT consultant Comments fwd head, rounded shoulders     ROM / Strength   AROM / PROM / Strength AROM;Strength     AROM   Overall AROM Comments Lumbar ROM decreased 50% with c/o tightness in the low back, cervical ROM was decreased 50% for flexion and rotation, decreased 75% for extnesion and side bending     Strength   Overall Strength Comments 4-/5 for the LE's, shoulders 4/5     Flexibility   Soft Tissue Assessment /Muscle Length --  tight  HS, calves and piriformis     Palpation   Palpation comment very tight in the lumbar and cervical paraspinals     Ambulation/Gait   Gait Comments no device, slow, WBOS, reports difficulkty standing for > 2 minutes                   [facility] Adult PT Treatment/Exercise - 09/25/16 0001      Modalities   Modalities Electrical Stimulation;Moist Heat     Moist Heat Therapy   Number Minutes Moist Heat 15 Minutes   Moist Heat Location Lumbar Spine     Electrical Stimulation   Electrical Stimulation Location Lumbar   Electrical Stimulation Action IFC   Electrical Stimulation Parameters sitting   Electrical Stimulation Goals Pain                PT Education - 09/25/16 0958    Education provided Yes   Education Details Wms flexion   Person(s) Educated Patient   Methods Explanation;Demonstration;Handout   Comprehension Verbalized understanding          PT Short Term Goals - 09/25/16 1001      PT SHORT TERM GOAL #1   Title independent with initial HEP   Time 2   Period Weeks   Status New           PT Long Term Goals - 09/25/16 1003      PT LONG TERM GOAL #1   Title decrease pain 50%   Time 8   Period Weeks   Status New     PT LONG TERM GOAL #2   Title tolerate standing >5 minutes wihtout pain >4/10   Time 8   Period Weeks   Status New     PT LONG TERM GOAL #3   Title increase lumbar ROM 25%   Time 8   Period Weeks   Status New     PT LONG TERM GOAL #4   Title understand proper posture and body mechanics   Time 8   Period Weeks   Status New               Plan - 09/25/16 0959    Clinical Impression Statement Patient reports long standing neck and back pain, with recent sciatic type pain, he reports more difficulty standing recently with currently tolerance to about 2 minutes, he has decreased Lumbar and cervical ROM, he has decreased strength of the LE's,  MRI showed mild bulging and DDD   Rehab Potential Good   PT Frequency 2x  / week   PT Duration 8 weeks   PT Treatment/Interventions ADLs/Self Care Home Management;Electrical Stimulation;Moist Heat;Traction;Ultrasound;Patient/family education;Therapeutic exercise;Therapeutic activities;Manual techniques   PT Next Visit Plan slowly start exercises, could add flexibility   Consulted and Agree with Plan of Care Patient      Patient will benefit from skilled therapeutic intervention in order to improve the following deficits and impairments:  Decreased activity tolerance, Decreased mobility, Decreased strength, Decreased range of motion, Difficulty walking, Impaired flexibility, Increased muscle spasms, Improper body mechanics  Visit Diagnosis: Chronic midline low back pain with left-sided sciatica - Plan: PT plan of care cert/re-cert  Muscle spasm of back - Plan: PT plan of care cert/re-cert  Cervicalgia - Plan: PT plan of care cert/re-cert      G-Codes - 32/91/91 1004    Functional Assessment Tool Used foto 60% limitation   Functional Limitation Other PT primary   Other PT Primary Current Status (Y6060) At least 60 percent but less than 80 percent impaired, limited or restricted   Other PT Primary Goal Status (O4599) At least 40 percent but less than 60 percent impaired, limited or restricted       Problem List Patient Active Problem List   Diagnosis Date Noted  . Radiculitis, lumbosacral 06/30/2016  . Diabetes ([place]) 01/01/2016  . Solitary pulmonary nodule 12/30/2015  . Urolithiasis 02/23/2015  . Hearing loss 11/25/2014  . Paresthesia 09/07/2014  . Routine general medical examination at a health care facility 07/07/2014  . Disturbance of skin sensation 12/26/2013  . UTI (urinary tract infection) 03/12/2013  . Screening for prostate cancer 02/14/2012  . Right knee pain 11/15/2011  . Encounter for long-term (current) use of other medications 01/17/2011  . Special screening examination for neoplasm of prostate 01/17/2011  . MYCOSIS FUNGOIDES  12/29/2009  . HEARING LOSS 09/30/2009  . ECZEMA 09/30/2009  . VITAMIN B12 DEFICIENCY 09/10/2009  . MORBID OBESITY 09/10/2009  . SYNCOPE 05/03/2009  . DIASTOLIC DYSFUNCTION 77/41/4239  . CARDIAC MURMUR, SYSTOLIC 53/20/2334  . Pancytopenia ([place]) 05/12/2008  . Disorder resulting from impaired renal function 05/12/2008  . EDEMA 05/12/2008  . Obstructive sleep apnea 11/04/2007  . BACK PAIN, LUMBAR 10/23/2007  . Dyslipidemia 05/20/2007  . 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 Essential hypertension 05/20/2007    Sumner Boast., PT 09/25/2016, 10:50 AM  [address] [place], [place], [address] Phone: [phone]   Fax:  [phone]  Name: [name] MRN: [id number] Date of Birth: May 06, 1941

## 2016-09-27 ENCOUNTER — Ambulatory Visit: Payer: Medicare Other | Admitting: Physical Therapy

## 2016-09-27 ENCOUNTER — Encounter: Payer: Self-pay | Admitting: Physical Therapy

## 2016-09-27 DIAGNOSIS — M542 Cervicalgia: Secondary | ICD-10-CM

## 2016-09-27 DIAGNOSIS — G8929 Other chronic pain: Secondary | ICD-10-CM

## 2016-09-27 DIAGNOSIS — M62830 Muscle spasm of back: Secondary | ICD-10-CM

## 2016-09-27 DIAGNOSIS — M5442 Lumbago with sciatica, left side: Principal | ICD-10-CM

## 2016-09-27 NOTE — Therapy (Signed)
[place] [address] [place], [place], [address] Phone: [phone]   Fax:  [phone]  Physical Therapy Treatment  Patient Details  Name: [name] MRN: [id number] Date of Birth: 02-21-1941 Referring Provider: Erlinda Hong  Encounter Date: 09/27/2016      PT End of Session - 09/27/16 0832    Visit Number 2   PT Start Time 9417   PT Stop Time 0848   PT Time Calculation (min) 53 min   Activity Tolerance Patient tolerated treatment well   Behavior During Therapy [facility] for tasks assessed/performed      Past Medical History:  Diagnosis Date  . Allergy   . Anal fissure   . Anemia, unspecified   . Arthritis    Spinal OA  . BACK PAIN, LUMBAR 10/23/2007  . CARDIAC MURMUR, SYSTOLIC 4/0/8144  . DIABETES MELLITUS, TYPE I 05/20/2007  . DIASTOLIC DYSFUNCTION 06/09/5630  . 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[place])   . Duodenitis   . Edema 05/12/2008  . Esophageal stricture   . GERD (gastroesophageal reflux disease)   . 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 Gynecomastia   . Hepatic steatosis   . HYPERLIPIDEMIA 05/20/2007  . Hypertension   . Hypogonadism male   . Morbid obesity ([place]) 09/10/2009  . OBSTRUCTIVE SLEEP APNEA 11/04/2007  . Personal history of colonic polyps   . RENAL INSUFFICIENCY 05/12/2008  . Sleep apnea    uses cpap  . Urolithiasis     Past Surgical History:  Procedure Laterality Date  . Abdominal US  09/1997  . arm fracture Left 1958   with hardware  . Colon cancer screening    . COLONOSCOPY  08/18/2004   diverticulitis  . COLONOSCOPY  09/24/2009  . ELECTROCARDIOGRAM  02/2006  . FLEXIBLE SIGMOIDOSCOPY  03/04/2001   polyps, anal fissure  . GANGLION CYST EXCISION Left 1980  . Lower Arterial  04/13/2004  . POLYPECTOMY    . Rest Cardiolite  03/19/2003    There were no vitals filed for this visit.      Subjective Assessment - 09/27/16 0755    Subjective Patient reports that he is still feeling tired and sore  all over.  Reports felt a little better overall after the first treatment.   Currently in Pain? Yes   Pain Score 4    Pain Location Back   Pain Orientation Mid;Lower   Pain Descriptors / Indicators Aching                         [facility] Adult PT Treatment/Exercise - 09/27/16 0001      Exercises   Exercises Lumbar     Lumbar Exercises: Aerobic   Elliptical NuStep Level 4 x 5 minutes     Lumbar Exercises: Machines for Strengthening   Other Lumbar Machine Exercise seated row 15#, lats 20# 2x15     Lumbar Exercises: Standing   Other Standing Lumbar Exercises standing 3# hip abduction and extension   Other Standing Lumbar Exercises 25# straight arm pull downs, 25# triceps     Lumbar Exercises: Supine   Other Supine Lumbar Exercises feet on ball K2C, trunk rotation, small bridges, isometrc abdominals     Moist Heat Therapy   Number Minutes Moist Heat 15 Minutes   Moist Heat Location Lumbar Spine     Electrical Stimulation   Electrical Stimulation Location Lumbar   Electrical Stimulation Action IFC   Electrical Stimulation Parameters sitting  Electrical Stimulation Goals Pain                  PT Short Term Goals - 09/25/16 1001      PT SHORT TERM GOAL #1   Title independent with initial HEP   Time 2   Period Weeks   Status New           PT Long Term Goals - 09/25/16 1003      PT LONG TERM GOAL #1   Title decrease pain 50%   Time 8   Period Weeks   Status New     PT LONG TERM GOAL #2   Title tolerate standing >5 minutes wihtout pain >4/10   Time 8   Period Weeks   Status New     PT LONG TERM GOAL #3   Title increase lumbar ROM 25%   Time 8   Period Weeks   Status New     PT LONG TERM GOAL #4   Title understand proper posture and body mechanics   Time 8   Period Weeks   Status New               Plan - 09/27/16 0712    Clinical Impression Statement Patient tolerates the exercises very well.  He had some back pain  with the trunk rotation and bridges.     PT Next Visit Plan slowly start exercises, could add flexibility   Consulted and Agree with Plan of Care Patient      Patient will benefit from skilled therapeutic intervention in order to improve the following deficits and impairments:     Visit Diagnosis: Chronic midline low back pain with left-sided sciatica  Muscle spasm of back  Cervicalgia     Problem List Patient Active Problem List   Diagnosis Date Noted  . Radiculitis, lumbosacral 06/30/2016  . Diabetes ([place]) 01/01/2016  . Solitary pulmonary nodule 12/30/2015  . Urolithiasis 02/23/2015  . Hearing loss 11/25/2014  . Paresthesia 09/07/2014  . Routine general medical examination at a health care facility 07/07/2014  . Disturbance of skin sensation 12/26/2013  . UTI (urinary tract infection) 03/12/2013  . Screening for prostate cancer 02/14/2012  . Right knee pain 11/15/2011  . Encounter for long-term (current) use of other medications 01/17/2011  . Special screening examination for neoplasm of prostate 01/17/2011  . MYCOSIS FUNGOIDES 12/29/2009  . HEARING LOSS 09/30/2009  . ECZEMA 09/30/2009  . VITAMIN B12 DEFICIENCY 09/10/2009  . MORBID OBESITY 09/10/2009  . SYNCOPE 05/03/2009  . DIASTOLIC DYSFUNCTION 19/75/8832  . CARDIAC MURMUR, SYSTOLIC 54/98/2641  . Pancytopenia ([place]) 05/12/2008  . Disorder resulting from impaired renal function 05/12/2008  . EDEMA 05/12/2008  . Obstructive sleep apnea 11/04/2007  . BACK PAIN, LUMBAR 10/23/2007  . Dyslipidemia 05/20/2007  . 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 Essential hypertension 05/20/2007    Sumner Boast., PT 09/27/2016, 8:41 AM  [address] [place], [place], [address] Phone: [phone]   Fax:  [phone]  Name: [name] MRN: [id number] Date of Birth: 01-17-1941

## 2016-09-29 ENCOUNTER — Encounter: Payer: Self-pay | Admitting: Endocrinology

## 2016-09-29 ENCOUNTER — Ambulatory Visit (INDEPENDENT_AMBULATORY_CARE_PROVIDER_SITE_OTHER): Payer: Medicare Other | Admitting: Endocrinology

## 2016-09-29 VITALS — BP 132/64 | HR 77 | Ht 73.0 in | Wt 269.0 lb

## 2016-09-29 DIAGNOSIS — E0800 Diabetes mellitus due to underlying condition with hyperosmolarity without nonketotic hyperglycemic-hyperosmolar coma (NKHHC): Secondary | ICD-10-CM | POA: Diagnosis not present

## 2016-09-29 LAB — POCT GLYCOSYLATED HEMOGLOBIN (HGB A1C): Hemoglobin A1C: 7.7

## 2016-09-29 NOTE — Patient Instructions (Addendum)
Please take these settings: basal rate of 2 units/hr, 24 hrs per day.   mealtime bolus of 1 unit/5 grams carbohydrate.  However, subtract 12 units from your calculated lunch bolus.  continue correction bolus (which some people call "sensitivity," or "insulin sensitivity ratio," or just "isr") of 1 unit for each 10 by which your glucose exceeds 100.  Please come back for a follow-up appointment in 3 months.

## 2016-09-29 NOTE — Progress Notes (Signed)
Subjective:    Patient ID: [name], male    DOB: November 27, 1940, 75 y.o.   MRN: [id number]  HPI Pt returns for f/u of diabetes mellitus: DM type: Insulin-requiring type 2 Dx'ed: 4944 Complications: polyneuropathy and renal insufficiency.  Therapy: insulin since 1995 DKA: never Severe hypoglycemia: never. Pancreatitis: never.  Other: he started pump therapy in mid-2015; he declines continuous glucose monitor.  Interval history: He takes these settings:  basal rate of 2 units/hr, 24 HRS a day   mealtime bolus of 1 unit/5 grams carbohydrate, except he subtracts 6 units from calculated lunch bolus.  correction bolus (which some people call "sensitivity," or "insulin sensitivity ratio," or just "isr") of 1 unit for each 10 by which glucose exceeds 100.  Meter is downloaded, and the printout is scanned into the record.  cbg varies from 60-350.  It is in general highest at hs.  He averages approx 80 total units per day.   He says today's a1c is affected by recent steroid injection.   Past Medical History:  Diagnosis Date  . Allergy   . Anal fissure   . Anemia, unspecified   . Arthritis    Spinal OA  . BACK PAIN, LUMBAR 10/23/2007  . CARDIAC MURMUR, SYSTOLIC 06/28/7590  . DIABETES MELLITUS, TYPE I 05/20/2007  . DIASTOLIC DYSFUNCTION 6/38/4665  . 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[place])   . Duodenitis   . Edema 05/12/2008  . Esophageal stricture   . GERD (gastroesophageal reflux disease)   . 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 Gynecomastia   . Hepatic steatosis   . HYPERLIPIDEMIA 05/20/2007  . Hypertension   . Hypogonadism male   . Morbid obesity ([place]) 09/10/2009  . OBSTRUCTIVE SLEEP APNEA 11/04/2007  . Personal history of colonic polyps   . RENAL INSUFFICIENCY 05/12/2008  . Sleep apnea    uses cpap  . Urolithiasis     Past Surgical History:  Procedure Laterality Date  . Abdominal US  09/1997  . arm fracture Left 1958   with hardware  . Colon cancer screening    . COLONOSCOPY   08/18/2004   diverticulitis  . COLONOSCOPY  09/24/2009  . ELECTROCARDIOGRAM  02/2006  . FLEXIBLE SIGMOIDOSCOPY  03/04/2001   polyps, anal fissure  . GANGLION CYST EXCISION Left 1980  . Lower Arterial  04/13/2004  . POLYPECTOMY    . Rest Cardiolite  03/19/2003    Social History   Social History  . Marital status: Married    Spouse name: N/A  . Number of children: N/A  . Years of education: N/A   Occupational History  .  [employer]   Social History Main Topics  . Smoking status: Former Smoker    Packs/day: 2.00    Years: 31.00    Types: Cigarettes    Quit date: 10/23/1982  . Smokeless tobacco: Never Used  . Alcohol use No  . Drug use: No  . Sexual activity: Not on file   Other Topics Concern  . Not on file   Social History Narrative   Lives with wife in a one story home.  Has a son and a daughter.     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     Education: 2 years of college.       Current Outpatient Prescriptions on File Prior to Visit  Medication Sig Dispense Refill  . ACCU-CHEK AVIVA PLUS test strip USE TO TEST BLOOD SUGAR 6  TIMES PER DAY. 540 each 3  .  ACCU-CHEK FASTCLIX LANCETS MISC Test 4 times daily as  directed 408 each 3  . alfuzosin (UROXATRAL) 10 MG 24 hr tablet Take 1 tablet by mouth  daily with breakfast 90 tablet 2  . allopurinol (ZYLOPRIM) 300 MG tablet TAKE 1 TABLET BY MOUTH  DAILY 90 tablet 3  . aspirin (BAYER LOW STRENGTH) 81 MG EC tablet Take 81 mg by mouth daily.      . Blood Glucose Monitoring Suppl (ACCU-CHEK AVIVA PLUS) w/Device KIT Use as directed 1 kit 0  . carvedilol (COREG) 3.125 MG tablet Take 0.5 tablets (1.5625 mg total) by mouth 2 (two) times daily with a meal. 90 tablet 3  . cycloSPORINE (RESTASIS) 0.05 % ophthalmic emulsion Place 1 drop into both eyes 2 (two) times daily.      . DULoxetine (CYMBALTA) 30 MG capsule take 1 capsule by mouth once daily 30 capsule 1  . fluticasone (FLONASE) 50 MCG/ACT nasal spray Place 1  spray into the nose daily as needed for allergies.     . folic acid (FOLVITE) 1 MG tablet Take 2 mg by mouth 2 (two) times daily. 4 tabs daily    . furosemide (LASIX) 20 MG tablet TAKE 1 TABLET BY MOUTH  DAILY 90 tablet 3  . gabapentin (NEURONTIN) 100 MG capsule Take 1 capsule (100 mg total) by mouth at bedtime. 30 capsule 1  . Insulin Infusion Pump Supplies (ACCU-CHEK PLASTIC CARTRIDGE) MISC USE AS DIRECTED PER INSULIN PUMP 30 each 3  . Insulin Infusion Pump Supplies (ULTRAFLEX) MISC CHANGE AS DIRECTED 30 each 3  . insulin lispro (HUMALOG) 100 UNIT/ML injection For use in pump, for a total of 80 units per day 80 mL 3  . methocarbamol (ROBAXIN) 500 MG tablet Take 1 tablet (500 mg total) by mouth every 8 (eight) hours as needed for muscle spasms. 90 tablet 1  . mometasone (ELOCON) 0.1 % lotion Apply topically daily. 60 mL 1  . Multiple Vitamins-Minerals (CENTRUM SILVER PO) Take 1 tablet by mouth daily.      . Omega-3 Fatty Acids (FISH OIL) 1200 MG CAPS Take 1,200 mg by mouth daily.     [place] Kitchen omeprazole (PRILOSEC) 20 MG capsule Take 1 capsule by mouth  daily as needed 90 capsule 2  . rosuvastatin (CRESTOR) 40 MG tablet TAKE 1 TABLET BY MOUTH AT  BEDTIME 90 tablet 3  . vitamin B-12 (CYANOCOBALAMIN) 1000 MCG tablet Take 1,000 mcg by mouth daily.     No current facility-administered medications on file prior to visit.     Allergies  Allergen Reactions  . Atorvastatin Other (See Comments)    unknown  . Niacin     REACTION: Severe heartburn  . Pioglitazone     REACTION: Edema    Family History  Problem Relation Age of Onset  . Colon cancer Brother 97  . Colon polyps Brother   . Cancer Brother     lung cancer, deceased  . Other Mother     MVA, deceased 54s  . Healthy Daughter   . Healthy Son   . Rectal cancer Neg Hx   . Stomach cancer Neg Hx    BP 132/64   Pulse 77   Ht '6\' 1"'  (1.854 m)   Wt 269 lb (122 kg)   SpO2 97%   BMI 35.49 kg/m   Review of Systems Denies LOC.       Objective:   Physical Exam VITAL SIGNS:  See vs page GENERAL: no distress Pulses: dorsalis pedis intact bilat.  MSK: no deformity of the feet CV: 1+ bilat leg edema Skin:  no ulcer on the feet.  normal color and temp on the feet. Neuro: sensation is intact to touch on the feet, but decreased from normal  A1c=7.7%    Assessment & Plan:  Insulin-requiring type 2 DM, with renal insufficiency: worse.   Knee pain: steroid injection is affecting glycemic control.    Patient is advised the following: Patient Instructions  Please take these settings: basal rate of 2 units/hr, 24 hrs per day.   mealtime bolus of 1 unit/5 grams carbohydrate.  However, subtract 12 units from your calculated lunch bolus.  continue correction bolus (which some people call "sensitivity," or "insulin sensitivity ratio," or just "isr") of 1 unit for each 10 by which your glucose exceeds 100.  Please come back for a follow-up appointment in 3 months.

## 2016-10-02 ENCOUNTER — Encounter: Payer: Self-pay | Admitting: Physical Therapy

## 2016-10-02 ENCOUNTER — Ambulatory Visit: Payer: Medicare Other | Admitting: Physical Therapy

## 2016-10-02 DIAGNOSIS — M5442 Lumbago with sciatica, left side: Principal | ICD-10-CM

## 2016-10-02 DIAGNOSIS — M62830 Muscle spasm of back: Secondary | ICD-10-CM

## 2016-10-02 DIAGNOSIS — M542 Cervicalgia: Secondary | ICD-10-CM

## 2016-10-02 DIAGNOSIS — G8929 Other chronic pain: Secondary | ICD-10-CM

## 2016-10-02 NOTE — Therapy (Signed)
[address] [place], [place], [address] Phone: [phone]   Fax:  [phone]  Physical Therapy Treatment  Patient Details  Name: [name] MRN: [id number] Date of Birth: [birth year] Referring Provider: Erlinda Hong  Encounter Date: 10/02/2016      PT End of Session - 10/02/16 1358    Visit Number 3   Date for PT Re-Evaluation 11/26/16   PT Start Time 6440   PT Stop Time 1405   PT Time Calculation (min) 59 min   Activity Tolerance Patient tolerated treatment well   Behavior During Therapy [facility] for tasks assessed/performed      Past Medical History:  Diagnosis Date  . Allergy   . Anal fissure   . Anemia, unspecified   . Arthritis    Spinal OA  . BACK PAIN, LUMBAR 10/23/2007  . CARDIAC MURMUR, SYSTOLIC 12/25/7423  . DIABETES MELLITUS, TYPE I 05/20/2007  . DIASTOLIC DYSFUNCTION 9/56/3875  . 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[place])   . Duodenitis   . Edema 05/12/2008  . Esophageal stricture   . GERD (gastroesophageal reflux disease)   . 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 Gynecomastia   . Hepatic steatosis   . HYPERLIPIDEMIA 05/20/2007  . Hypertension   . Hypogonadism male   . Morbid obesity ([place]) 09/10/2009  . OBSTRUCTIVE SLEEP APNEA 11/04/2007  . Personal history of colonic polyps   . RENAL INSUFFICIENCY 05/12/2008  . Sleep apnea    uses cpap  . Urolithiasis     Past Surgical History:  Procedure Laterality Date  . Abdominal US  09/1997  . arm fracture Left 1958   with hardware  . Colon cancer screening    . COLONOSCOPY  08/18/2004   diverticulitis  . COLONOSCOPY  09/24/2009  . ELECTROCARDIOGRAM  02/2006  . FLEXIBLE SIGMOIDOSCOPY  03/04/2001   polyps, anal fissure  . GANGLION CYST EXCISION Left 1980  . Lower Arterial  04/13/2004  . POLYPECTOMY    . Rest Cardiolite  03/19/2003    There were no vitals filed for this visit.      Subjective Assessment - 10/02/16 1311    Subjective Reports that he is  doing alright.  No big changes, reports that his knee is popping some, but not hurting.   Currently in Pain? Yes   Pain Score 4    Pain Location Back   Pain Orientation Mid;Lower   Pain Descriptors / Indicators Aching   Pain Type Chronic pain   Aggravating Factors  standing   Pain Relieving Factors the treatment helps some                         [facility] Adult PT Treatment/Exercise - 10/02/16 0001      Lumbar Exercises: Aerobic   Elliptical NuStep Level 4 x 6 minutes     Lumbar Exercises: Machines for Strengthening   Other Lumbar Machine Exercise seated row 15#, lats 20# 2x15     Lumbar Exercises: Standing   Other Standing Lumbar Exercises standing 3# hip abduction and extension   Other Standing Lumbar Exercises 25# straight arm pull downs, 25# triceps     Lumbar Exercises: Supine   Other Supine Lumbar Exercises feet on ball K2C, trunk rotation, small bridges, isometrc abdominals     Moist Heat Therapy   Number Minutes Moist Heat 15 Minutes   Moist Heat Location Lumbar Spine     Electrical Stimulation   Electrical Stimulation  Location Lumbar   Electrical Stimulation Action IFC   Electrical Stimulation Parameters sitting   Electrical Stimulation Goals Pain                  PT Short Term Goals - 10/02/16 1400      PT SHORT TERM GOAL #1   Title independent with initial HEP   Status Achieved           PT Long Term Goals - 09/25/16 1003      PT LONG TERM GOAL #1   Title decrease pain 50%   Time 8   Period Weeks   Status New     PT LONG TERM GOAL #2   Title tolerate standing >5 minutes wihtout pain >4/10   Time 8   Period Weeks   Status New     PT LONG TERM GOAL #3   Title increase lumbar ROM 25%   Time 8   Period Weeks   Status New     PT LONG TERM GOAL #4   Title understand proper posture and body mechanics   Time 8   Period Weeks   Status New               Plan - 10/02/16 1359    Clinical Impression Statement  Patient was afraid of some knee exercises due to "popping".  He is weak in the core and needs some flexibility of the LE's   PT Next Visit Plan add core and flexibility exercises   Consulted and Agree with Plan of Care Patient      Patient will benefit from skilled therapeutic intervention in order to improve the following deficits and impairments:  Decreased activity tolerance, Decreased mobility, Decreased strength, Decreased range of motion, Difficulty walking, Impaired flexibility, Increased muscle spasms, Improper body mechanics  Visit Diagnosis: Chronic midline low back pain with left-sided sciatica  Muscle spasm of back  Cervicalgia     Problem List Patient Active Problem List   Diagnosis Date Noted  . Radiculitis, lumbosacral 06/30/2016  . Diabetes ([place]) 01/01/2016  . Solitary pulmonary nodule 12/30/2015  . Urolithiasis 02/23/2015  . Hearing loss 11/25/2014  . Paresthesia 09/07/2014  . Routine general medical examination at a health care facility 07/07/2014  . Disturbance of skin sensation 12/26/2013  . UTI (urinary tract infection) 03/12/2013  . Screening for prostate cancer 02/14/2012  . Right knee pain 11/15/2011  . Encounter for long-term (current) use of other medications 01/17/2011  . Special screening examination for neoplasm of prostate 01/17/2011  . MYCOSIS FUNGOIDES 12/29/2009  . HEARING LOSS 09/30/2009  . ECZEMA 09/30/2009  . VITAMIN B12 DEFICIENCY 09/10/2009  . MORBID OBESITY 09/10/2009  . SYNCOPE 05/03/2009  . DIASTOLIC DYSFUNCTION 35/36/1443  . CARDIAC MURMUR, SYSTOLIC 15/40/0867  . Pancytopenia ([place]) 05/12/2008  . Disorder resulting from impaired renal function 05/12/2008  . EDEMA 05/12/2008  . Obstructive sleep apnea 11/04/2007  . BACK PAIN, LUMBAR 10/23/2007  . Dyslipidemia 05/20/2007  . 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 Essential hypertension 05/20/2007    Sumner Boast., PT 10/02/2016, 2:01 PM  [address] [place], [place], [address] Phone: [phone]   Fax:  [phone]  Name: [name] MRN: [id number] Date of Birth: 1940/11/23

## 2016-10-04 ENCOUNTER — Encounter: Payer: Medicare Other | Attending: Physical Medicine & Rehabilitation | Admitting: Physical Medicine & Rehabilitation

## 2016-10-04 ENCOUNTER — Encounter: Payer: Self-pay | Admitting: Physical Medicine & Rehabilitation

## 2016-10-04 VITALS — BP 147/83 | HR 72 | Resp 14

## 2016-10-04 DIAGNOSIS — K219 Gastro-esophageal reflux disease without esophagitis: Secondary | ICD-10-CM | POA: Insufficient documentation

## 2016-10-04 DIAGNOSIS — N189 Chronic kidney disease, unspecified: Secondary | ICD-10-CM | POA: Insufficient documentation

## 2016-10-04 DIAGNOSIS — M1990 Unspecified osteoarthritis, unspecified site: Secondary | ICD-10-CM | POA: Insufficient documentation

## 2016-10-04 DIAGNOSIS — M109 Gout, unspecified: Secondary | ICD-10-CM | POA: Diagnosis not present

## 2016-10-04 DIAGNOSIS — M791 Myalgia, unspecified site: Secondary | ICD-10-CM

## 2016-10-04 DIAGNOSIS — E1022 Type 1 diabetes mellitus with diabetic chronic kidney disease: Secondary | ICD-10-CM | POA: Insufficient documentation

## 2016-10-04 DIAGNOSIS — E785 Hyperlipidemia, unspecified: Secondary | ICD-10-CM | POA: Diagnosis not present

## 2016-10-04 DIAGNOSIS — E1042 Type 1 diabetes mellitus with diabetic polyneuropathy: Secondary | ICD-10-CM | POA: Insufficient documentation

## 2016-10-04 DIAGNOSIS — M545 Low back pain, unspecified: Secondary | ICD-10-CM

## 2016-10-04 DIAGNOSIS — M461 Sacroiliitis, not elsewhere classified: Secondary | ICD-10-CM

## 2016-10-04 DIAGNOSIS — G479 Sleep disorder, unspecified: Secondary | ICD-10-CM

## 2016-10-04 DIAGNOSIS — G8929 Other chronic pain: Secondary | ICD-10-CM

## 2016-10-04 DIAGNOSIS — M542 Cervicalgia: Secondary | ICD-10-CM | POA: Insufficient documentation

## 2016-10-04 DIAGNOSIS — I129 Hypertensive chronic kidney disease with stage 1 through stage 4 chronic kidney disease, or unspecified chronic kidney disease: Secondary | ICD-10-CM | POA: Insufficient documentation

## 2016-10-04 DIAGNOSIS — Z87891 Personal history of nicotine dependence: Secondary | ICD-10-CM | POA: Diagnosis not present

## 2016-10-04 DIAGNOSIS — G4733 Obstructive sleep apnea (adult) (pediatric): Secondary | ICD-10-CM | POA: Insufficient documentation

## 2016-10-04 DIAGNOSIS — E1142 Type 2 diabetes mellitus with diabetic polyneuropathy: Secondary | ICD-10-CM

## 2016-10-04 MED ORDER — DULOXETINE HCL 60 MG PO CPEP: 60.0000 mg | ORAL_CAPSULE | Freq: Every day | ORAL | 1 refills | Status: DC

## 2016-10-04 MED ORDER — GABAPENTIN 100 MG PO CAPS: 100.0000 mg | ORAL_CAPSULE | Freq: Three times a day (TID) | ORAL | 1 refills | Status: DC

## 2016-10-04 NOTE — Progress Notes (Signed)
Subjective:    Patient ID: [name], male    DOB: [birth year], 75 y.o.   MRN: [id number]  HPI  75 y/o male with pmh of OSA, CKD, HTN, DM type 1 with peripheral neuropathy presents for follow up of low back pain > neck pain.  Bilateral L>R back pain.  Started ~>20 years ago.  No inciting event.  Sitting/laying improve the pain.  Standing exacerbates the pain.  Sharp, burning pain.  Radiates to left posterior thigh.  Intermittent.  He has associated numbness/tingling.  Pain limits from doing ADLs and fishing.    Last clinic visit 08/31/16.  Since last visit, denies falls. He has been going to PT, which has been helping.  He goes 2/week.  He has not been able to go to the pool yet.  He has not tried Robaxin yet.  He appears to have benefit with Cymbalta.  Gabapentin is helping.  Overall, pt states he is doing better. He states he is able to do more with ADLs.   Pain Inventory Average Pain 7 Pain Right Now 6 My pain is sharp, burning and tingling  In the last 24 hours, has pain interfered with the following? General activity 6 Relation with others 0 Enjoyment of life 3 What TIME of day is your pain at its worst? morning Sleep (in general) Fair  Pain is worse with: walking, bending, standing and some activites Pain improves with: rest Relief from Meds: 5  Mobility walk without assistance how many minutes can you walk? 15-30 ability to climb steps?  yes do you drive?  yes  Function retired  Neuro/Psych weakness tingling trouble walking  Prior Studies x-rays CT/MRI nerve study new visit  Physicians involved in your care Primary care Dr. Loanne Drilling Neurologist Dr. Erlinda Hong new visit   Family History  Problem Relation Age of Onset  . Colon cancer Brother 29  . Colon polyps Brother   . Cancer Brother     lung cancer, deceased  . Other Mother     MVA, deceased 80s  . Healthy Daughter   . Healthy Son   . Rectal cancer Neg Hx   . Stomach cancer Neg Hx    Social History    Social History  . Marital status: Married    Spouse name: N/A  . Number of children: N/A  . Years of education: N/A   Occupational History  .  [employer]   Social History Main Topics  . Smoking status: Former Smoker    Packs/day: 2.00    Years: 31.00    Types: Cigarettes    Quit date: 10/23/1982  . Smokeless tobacco: Never Used  . Alcohol use No  . Drug use: No  . Sexual activity: Not Asked   Other Topics Concern  . None   Social History Narrative   Lives with wife in a one story home.  Has a son and a daughter.     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     Education: 2 years of college.      Past Surgical History:  Procedure Laterality Date  . Abdominal US  09/1997  . arm fracture Left 1958   with hardware  . Colon cancer screening    . COLONOSCOPY  08/18/2004   diverticulitis  . COLONOSCOPY  09/24/2009  . ELECTROCARDIOGRAM  02/2006  . FLEXIBLE SIGMOIDOSCOPY  03/04/2001   polyps, anal fissure  . GANGLION CYST EXCISION Left 1980  . Lower Arterial  04/13/2004  .  POLYPECTOMY    . Rest Cardiolite  03/19/2003   Past Medical History:  Diagnosis Date  . Allergy   . Anal fissure   . Anemia, unspecified   . Arthritis    Spinal OA  . BACK PAIN, LUMBAR 10/23/2007  . CARDIAC MURMUR, SYSTOLIC 12/01/9369  . DIABETES MELLITUS, TYPE I 05/20/2007  . DIASTOLIC DYSFUNCTION 6/96/7893  . 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[place])   . Duodenitis   . Edema 05/12/2008  . Esophageal stricture   . GERD (gastroesophageal reflux disease)   . 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 Gynecomastia   . Hepatic steatosis   . HYPERLIPIDEMIA 05/20/2007  . Hypertension   . Hypogonadism male   . Morbid obesity ([place]) 09/10/2009  . OBSTRUCTIVE SLEEP APNEA 11/04/2007  . Personal history of colonic polyps   . RENAL INSUFFICIENCY 05/12/2008  . Sleep apnea    uses cpap  . Urolithiasis    BP (!) 147/83   Pulse 72   Resp 14   SpO2 95%   Opioid Risk Score:   Fall Risk Score:   `1  Depression screen PHQ 2/9  Depression screen PHQ 2/9 08/31/2016  Decreased Interest 0  Down, Depressed, Hopeless 0  PHQ - 2 Score 0  Altered sleeping 0  Tired, decreased energy 1  Change in appetite 0  Feeling bad or failure about yourself  0  Trouble concentrating 0  Moving slowly or fidgety/restless 0  Suicidal thoughts 0  PHQ-9 Score 1  Difficult doing work/chores Somewhat difficult    Review of Systems  Constitutional: Positive for diaphoresis.  HENT: Negative.   Eyes: Negative.   Respiratory: Negative.   Cardiovascular: Negative.   Gastrointestinal: Negative.   Endocrine:       Diabetic High blood sugar  Genitourinary: Positive for dysuria.       Retention   Musculoskeletal: Positive for arthralgias, back pain, gait problem, myalgias and neck pain.  Allergic/Immunologic: Negative.   Neurological: Positive for weakness.       Tingling  Hematological: Negative.   Psychiatric/Behavioral: Negative.   All other systems reviewed and are negative.     Objective:   Physical Exam Gen: NAD. Vital signs reviewed HENT: Normocephalic, Atraumatic Eyes: EOMI. No discharge.  Cardio: RRR. No JVD. Pulm: B/l clear to auscultation.  Effort normal Abd: Soft, non-distended, non-tender, BS+ MSK:  Gait WNL.   TTP mildly along b/l SI joints, improving.    No edema.   Neg FABERs.   ROM WNL Neuro:  Sensation diminished to light touch b/l LE >feet. Reflexes 2+ throughout  Strength  5/5 in all LE myotomes Skin: Warm and Dry    Assessment & Plan:  75 y/o male with pmh of OSA, CKD, HTN, DM type 1 with peripheral neuropathy presents with low back pain > neck pain.    1. Chronic mechanical low back pain  MRIs C,L-spine early 2016 suggesting multilevel spondylosis with mild b/l multilevel foraminal narrowing C4-C7 and shallow disc bulge at L5-S1 without central canal or foraminal stenosis.  Avoid ASA due to CKD  Cont PT with eval and treat with consideration for low back pain  core/quad strengthening.  TENs unit helping. Will consider home unit in future.   Encouraged aquatic therapy, pt states he will start after therapies  Cont tylenol  Encouraged pt to trial Robaxin 500 TID PRN  Will increase Cymbalta 60mg    Will Gabapentin to 100 TID  Will consider bracing in future if necessary  Will consider injections in future  if necesary  2. Sacroiliitis  Will consider joint belt in future if necessary  Will consider injections in future if necessary  3. Sleep disturbance  Pt recently with new mattress  Cont CPAP  Improved  4. Myalgia  Will consider trigger point injection to sacral PSPs in future if necessary, not necessary today  5. Diabetic neuropathy  Cont Cymbalta   Gabapentin increased to 100 TID  6. Morbid obesity  Pt not interested in seeing dietitian at this time

## 2016-10-05 ENCOUNTER — Ambulatory Visit: Payer: Medicare Other | Admitting: Physical Therapy

## 2016-10-05 ENCOUNTER — Telehealth (INDEPENDENT_AMBULATORY_CARE_PROVIDER_SITE_OTHER): Payer: Self-pay | Admitting: Orthopaedic Surgery

## 2016-10-05 ENCOUNTER — Encounter: Payer: Self-pay | Admitting: Physical Therapy

## 2016-10-05 DIAGNOSIS — M542 Cervicalgia: Secondary | ICD-10-CM

## 2016-10-05 DIAGNOSIS — M5442 Lumbago with sciatica, left side: Principal | ICD-10-CM

## 2016-10-05 DIAGNOSIS — M62830 Muscle spasm of back: Secondary | ICD-10-CM

## 2016-10-05 DIAGNOSIS — G8929 Other chronic pain: Secondary | ICD-10-CM

## 2016-10-05 NOTE — Telephone Encounter (Signed)
Pt asking if we can order him an MRI, states he's not getting any better and Dr. Erlinda Hong said to call back if we needed to do so. Pt number [phone]

## 2016-10-05 NOTE — Therapy (Signed)
[place], [place], [address] Phone: [phone]   Fax:  [phone]  Physical Therapy Treatment  Patient Details  Name: [name] MRN: [id number] Date of Birth: [birth year] Referring Provider: Erlinda Hong  Encounter Date: 10/05/2016      PT End of Session - 10/05/16 0838    Visit Number 4   Date for PT Re-Evaluation 11/26/16   PT Start Time 0752   PT Stop Time 0855   PT Time Calculation (min) 63 min   Activity Tolerance Patient tolerated treatment well   Behavior During Therapy [facility] for tasks assessed/performed      Past Medical History:  Diagnosis Date  . Allergy   . Anal fissure   . Anemia, unspecified   . Arthritis    Spinal OA  . BACK PAIN, LUMBAR 10/23/2007  . CARDIAC MURMUR, SYSTOLIC 06/27/2840  . DIABETES MELLITUS, TYPE I 05/20/2007  . DIASTOLIC DYSFUNCTION 01/13/4009  . 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[place])   . Duodenitis   . Edema 05/12/2008  . Esophageal stricture   . GERD (gastroesophageal reflux disease)   . 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 Gynecomastia   . Hepatic steatosis   . HYPERLIPIDEMIA 05/20/2007  . Hypertension   . Hypogonadism male   . Morbid obesity ([place]) 09/10/2009  . OBSTRUCTIVE SLEEP APNEA 11/04/2007  . Personal history of colonic polyps   . RENAL INSUFFICIENCY 05/12/2008  . Sleep apnea    uses cpap  . Urolithiasis     Past Surgical History:  Procedure Laterality Date  . Abdominal US  09/1997  . arm fracture Left 1958   with hardware  . Colon cancer screening    . COLONOSCOPY  08/18/2004   diverticulitis  . COLONOSCOPY  09/24/2009  . ELECTROCARDIOGRAM  02/2006  . FLEXIBLE SIGMOIDOSCOPY  03/04/2001   polyps, anal fissure  . GANGLION CYST EXCISION Left 1980  . Lower Arterial  04/13/2004  . POLYPECTOMY    . Rest Cardiolite  03/19/2003    There were no vitals filed for this visit.      Subjective Assessment - 10/05/16 0835    Subjective Reports some  increase of neck pain   Currently in Pain? Yes   Pain Score 4    Pain Location Neck                         [facility] Adult PT Treatment/Exercise - 10/05/16 0001      Lumbar Exercises: Aerobic   Elliptical NuStep Level 4 x 6 minutes     Lumbar Exercises: Machines for Strengthening   Cybex Knee Extension 5# 2x10   Cybex Knee Flexion 25# 2x15   Leg Press 20# 2x10   Other Lumbar Machine Exercise seated row 15#, lats 20# 2x15   Other Lumbar Machine Exercise straight arm pull downs with cues to tighten abs 2x15     Lumbar Exercises: Supine   Other Supine Lumbar Exercises feet on ball K2C, trunk rotation, small bridges, isometrc abdominals     Modalities   Modalities Traction     Moist Heat Therapy   Number Minutes Moist Heat 15 Minutes   Moist Heat Location Lumbar Spine     Electrical Stimulation   Electrical Stimulation Location Lumbar   Electrical Stimulation Action IFC   Electrical Stimulation Parameters sitting   Electrical Stimulation Goals Pain     Traction   Type of Traction Cervical   Min (  lbs) 12   Max (lbs) static   Time 15                  PT Short Term Goals - 10/02/16 1400      PT SHORT TERM GOAL #1   Title independent with initial HEP   Status Achieved           PT Long Term Goals - 10/05/16 0841      PT LONG TERM GOAL #1   Title decrease pain 50%   Status On-going     PT LONG TERM GOAL #2   Title tolerate standing >5 minutes wihtout pain >4/10   Status On-going               Plan - 10/05/16 0840    Clinical Impression Statement Patient tolerated increase of exercises today, we tried the traction and he seemed to do well with it   PT Next Visit Plan assess traction   Consulted and Agree with Plan of Care Patient      Patient will benefit from skilled therapeutic intervention in order to improve the following deficits and impairments:  Decreased activity tolerance, Decreased mobility, Decreased strength,  Decreased range of motion, Difficulty walking, Impaired flexibility, Increased muscle spasms, Improper body mechanics  Visit Diagnosis: Chronic midline low back pain with left-sided sciatica  Muscle spasm of back  Cervicalgia     Problem List Patient Active Problem List   Diagnosis Date Noted  . Radiculitis, lumbosacral 06/30/2016  . Diabetes ([place]) 01/01/2016  . Solitary pulmonary nodule 12/30/2015  . Urolithiasis 02/23/2015  . Hearing loss 11/25/2014  . Paresthesia 09/07/2014  . Routine general medical examination at a health care facility 07/07/2014  . Disturbance of skin sensation 12/26/2013  . UTI (urinary tract infection) 03/12/2013  . Screening for prostate cancer 02/14/2012  . Right knee pain 11/15/2011  . Encounter for long-term (current) use of other medications 01/17/2011  . Special screening examination for neoplasm of prostate 01/17/2011  . MYCOSIS FUNGOIDES 12/29/2009  . HEARING LOSS 09/30/2009  . ECZEMA 09/30/2009  . VITAMIN B12 DEFICIENCY 09/10/2009  . MORBID OBESITY 09/10/2009  . SYNCOPE 05/03/2009  . DIASTOLIC DYSFUNCTION 20/35/5974  . CARDIAC MURMUR, SYSTOLIC 16/38/4536  . Pancytopenia ([place]) 05/12/2008  . Disorder resulting from impaired renal function 05/12/2008  . EDEMA 05/12/2008  . Obstructive sleep apnea 11/04/2007  . BACK PAIN, LUMBAR 10/23/2007  . Dyslipidemia 05/20/2007  . 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 Essential hypertension 05/20/2007    Sumner Boast., PT 10/05/2016, 8:42 AM  [address] [place], [place], [address] Phone: [phone]   Fax:  [phone]  Name: Aadyn Buchheit MRN: [id number] Date of Birth: 15-Sep-1941

## 2016-10-05 NOTE — Telephone Encounter (Signed)
Yes - MRI please

## 2016-10-05 NOTE — Telephone Encounter (Signed)
Please advise 

## 2016-10-06 ENCOUNTER — Encounter: Payer: Self-pay | Admitting: Internal Medicine

## 2016-10-06 ENCOUNTER — Ambulatory Visit (INDEPENDENT_AMBULATORY_CARE_PROVIDER_SITE_OTHER): Payer: Medicare Other | Admitting: Internal Medicine

## 2016-10-06 ENCOUNTER — Other Ambulatory Visit (INDEPENDENT_AMBULATORY_CARE_PROVIDER_SITE_OTHER): Payer: Self-pay

## 2016-10-06 VITALS — BP 126/80 | HR 81 | Ht 72.0 in | Wt 271.0 lb

## 2016-10-06 DIAGNOSIS — G4733 Obstructive sleep apnea (adult) (pediatric): Secondary | ICD-10-CM

## 2016-10-06 DIAGNOSIS — G8929 Other chronic pain: Secondary | ICD-10-CM

## 2016-10-06 DIAGNOSIS — M25561 Pain in right knee: Principal | ICD-10-CM

## 2016-10-06 NOTE — Telephone Encounter (Signed)
Called pt to let him know MRI order has been made. They will be contacting him to sched appt

## 2016-10-06 NOTE — Telephone Encounter (Signed)
Chronic pain 

## 2016-10-06 NOTE — Patient Instructions (Addendum)
Order: DME [facility]-please provide pressure compliance download Please evaluate eligibility for replacement machine We can continue CPAP 11.6, mask of choice, humidifier, supplies, Air View    Dx OSA  Please call if we can help

## 2016-10-06 NOTE — Assessment & Plan Note (Signed)
She depends on CPAP indefinitely feels life is better with it. Describes excellent compliance and control with no snore through and good sleep quality. He'll be getting this download. Plan-download for current CPAP performance. Evaluate eligibility for placement machine

## 2016-10-06 NOTE — Progress Notes (Signed)
   Subjective:    Patient ID: [name], male    DOB: [birth year], 75 y.o.   MRN: [id number]  HPI   male followed for OSA, complicated by obesity, HBP, DM 2, diastolic dysfunction, GERD   -------------------------------------------------------------------------------------------- 10/07/2015-75 year old male followed for OSA, complicated by obesity, HBP,  NPSG 2004 AHI 9/hr CPAP/11.6/[facility] pt; Wears CPAP every night Says sleep is "great" with CPAP used every night "can't sleep without it". Download reviewed.  10/06/2016-75 year old male followed for OSA, complicated by obesity, HBP, DM 2, diastolic dysfunction, GERD CPAP 11.6/[facility] FOLLOWS FOR: [facility]. Pt wears CPAP nightly;pt brought machine without cord. Will need to get DL-pt going by there today for filters. Dealing with arthritis pains and understands benefits that would follow from successful weight loss. Can't sleep without CPAP. Not sure how old the machine is now and whether he might be eligible for replacement.  Review of Systems  Constitutional: Negative for fever and unexpected weight change.  HENT: Negative for congestion, dental problem, ear pain, nosebleeds, postnasal drip, rhinorrhea, sinus pressure, sneezing, sore throat and trouble swallowing.   Eyes: Negative for redness and itching.  Respiratory: Negative for cough, chest tightness, shortness of breath and wheezing.   Cardiovascular: Negative for palpitations and leg swelling.  Gastrointestinal: Negative for nausea and vomiting.  Genitourinary: Negative for dysuria.  Musculoskeletal: Negative for joint swelling.  Skin: Negative for rash.  Neurological: Negative for headaches.  Hematological: Does not bruise/bleed easily.  Psychiatric/Behavioral: Negative for dysphoric mood. The patient is not nervous/anxious.    Objective:  OBJ- Physical Exam General- Alert, Oriented, Affect-appropriate, Distress- none  acute, + obese Skin- rash-none, lesions- none, excoriation- none Lymphadenopathy- none Head- atraumatic            Eyes- Gross vision intact, PERRLA, conjunctivae and secretions clear            Ears- Hearing, canals-normal            Nose- Clear, no-Septal dev, mucus, polyps, erosion, perforation             Throat- Mallampati II , mucosa clear , drainage- none, tonsils- atrophic Neck- flexible , trachea midline, no stridor , thyroid nl, carotid no bruit Chest - symmetrical excursion , unlabored           Heart/CV- RRR , no murmur , no gallop  , no rub, nl s1 s2                           - JVD- none , edema- none, stasis changes- none, varices- none           Lung- clear to P&A, wheeze- none, cough- none , dullness-none, rub- none           Chest wall-  Abd-  Br/ Gen/ Rectal- Not done, not indicated Extrem- cyanosis- none, clubbing, none, atrophy- none, strength- nl Neuro- grossly intact to observation    Assessment & Plan:

## 2016-10-06 NOTE — Telephone Encounter (Signed)
What  would you like to R/O?

## 2016-10-06 NOTE — Assessment & Plan Note (Signed)
He understands the medical benefits anticipated from successful sustained weight loss and his discussed this problem with his endocrinologist.

## 2016-10-10 ENCOUNTER — Ambulatory Visit: Payer: Medicare Other | Admitting: Physical Therapy

## 2016-10-10 ENCOUNTER — Encounter: Payer: Self-pay | Admitting: Physical Therapy

## 2016-10-10 DIAGNOSIS — M542 Cervicalgia: Secondary | ICD-10-CM

## 2016-10-10 DIAGNOSIS — M62830 Muscle spasm of back: Secondary | ICD-10-CM

## 2016-10-10 DIAGNOSIS — G8929 Other chronic pain: Secondary | ICD-10-CM

## 2016-10-10 DIAGNOSIS — M5442 Lumbago with sciatica, left side: Principal | ICD-10-CM

## 2016-10-10 NOTE — Therapy (Signed)
[place] [place], [place], [address] Phone: [phone]   Fax:  [phone]  Physical Therapy Treatment  Patient Details  Name: [name] MRN: [id number] Date of Birth: 1940-11-02 Referring Provider: Erlinda Hong  Encounter Date: 10/10/2016      PT End of Session - 10/10/16 0822    Visit Number 5   Date for PT Re-Evaluation 11/26/16   PT Start Time 0750   PT Stop Time 0851   PT Time Calculation (min) 61 min   Activity Tolerance Patient tolerated treatment well   Behavior During Therapy [facility] for tasks assessed/performed      Past Medical History:  Diagnosis Date  . Allergy   . Anal fissure   . Anemia, unspecified   . Arthritis    Spinal OA  . BACK PAIN, LUMBAR 10/23/2007  . CARDIAC MURMUR, SYSTOLIC 02/26/4331  . DIABETES MELLITUS, TYPE I 05/20/2007  . DIASTOLIC DYSFUNCTION 9/51/8841  . 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[place])   . Duodenitis   . Edema 05/12/2008  . Esophageal stricture   . GERD (gastroesophageal reflux disease)   . 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 Gynecomastia   . Hepatic steatosis   . HYPERLIPIDEMIA 05/20/2007  . Hypertension   . Hypogonadism male   . Morbid obesity ([place]) 09/10/2009  . OBSTRUCTIVE SLEEP APNEA 11/04/2007  . Personal history of colonic polyps   . RENAL INSUFFICIENCY 05/12/2008  . Sleep apnea    uses cpap  . Urolithiasis     Past Surgical History:  Procedure Laterality Date  . Abdominal US  09/1997  . arm fracture Left 1958   with hardware  . Colon cancer screening    . COLONOSCOPY  08/18/2004   diverticulitis  . COLONOSCOPY  09/24/2009  . ELECTROCARDIOGRAM  02/2006  . FLEXIBLE SIGMOIDOSCOPY  03/04/2001   polyps, anal fissure  . GANGLION CYST EXCISION Left 1980  . Lower Arterial  04/13/2004  . POLYPECTOMY    . Rest Cardiolite  03/19/2003    There were no vitals filed for this visit.      Subjective Assessment - 10/10/16 0754    Subjective Patient reports  that he is felling a little better.   Currently in Pain? Yes   Pain Score 4    Pain Location Neck                         [facility] Adult PT Treatment/Exercise - 10/10/16 0001      Lumbar Exercises: Aerobic   Elliptical NuStep Level 5 x 6 minutes   Tread Mill bike x 5 minutes     Lumbar Exercises: Machines for Strengthening   Cybex Knee Extension 5# 2x10   Cybex Knee Flexion 25# 2x15   Leg Press 20# 2x10   Other Lumbar Machine Exercise seated row 15#, lats 20# 2x15   Other Lumbar Machine Exercise straight arm pull downs with cues to tighten abs 2x15     Lumbar Exercises: Standing   Other Standing Lumbar Exercises standing 3# hip abduction and extension     Moist Heat Therapy   Number Minutes Moist Heat 15 Minutes   Moist Heat Location Lumbar Spine     Electrical Stimulation   Electrical Stimulation Location Lumbar   Electrical Stimulation Action IFC   Electrical Stimulation Parameters sitting   Electrical Stimulation Goals Pain  PT Short Term Goals - 10/02/16 1400      PT SHORT TERM GOAL #1   Title independent with initial HEP   Status Achieved           PT Long Term Goals - 10/10/16 6606      PT LONG TERM GOAL #1   Title decrease pain 50%   Status On-going     PT LONG TERM GOAL #2   Title tolerate standing >5 minutes wihtout pain >4/10   Status On-going     PT LONG TERM GOAL #3   Title increase lumbar ROM 25%   Status On-going     PT LONG TERM GOAL #4   Title understand proper posture and body mechanics   Status On-going               Plan - 10/10/16 0823    Clinical Impression Statement Reporting some increase of LBP today, reports he has a cold and overall not feeling well   PT Next Visit Plan may try to increase the exercises   Consulted and Agree with Plan of Care Patient      Patient will benefit from skilled therapeutic intervention in order to improve the following deficits and impairments:   Decreased activity tolerance, Decreased mobility, Decreased strength, Decreased range of motion, Difficulty walking, Impaired flexibility, Increased muscle spasms, Improper body mechanics  Visit Diagnosis: Chronic midline low back pain with left-sided sciatica  Muscle spasm of back  Cervicalgia     Problem List Patient Active Problem List   Diagnosis Date Noted  . Radiculitis, lumbosacral 06/30/2016  . Diabetes ([place]) 01/01/2016  . Solitary pulmonary nodule 12/30/2015  . Urolithiasis 02/23/2015  . Hearing loss 11/25/2014  . Paresthesia 09/07/2014  . Routine general medical examination at a health care facility 07/07/2014  . Disturbance of skin sensation 12/26/2013  . UTI (urinary tract infection) 03/12/2013  . Screening for prostate cancer 02/14/2012  . Right knee pain 11/15/2011  . Encounter for long-term (current) use of other medications 01/17/2011  . Special screening examination for neoplasm of prostate 01/17/2011  . MYCOSIS FUNGOIDES 12/29/2009  . HEARING LOSS 09/30/2009  . ECZEMA 09/30/2009  . VITAMIN B12 DEFICIENCY 09/10/2009  . MORBID OBESITY 09/10/2009  . SYNCOPE 05/03/2009  . DIASTOLIC DYSFUNCTION 30/16/0109  . CARDIAC MURMUR, SYSTOLIC 32/35/5732  . Pancytopenia ([place]) 05/12/2008  . Disorder resulting from impaired renal function 05/12/2008  . EDEMA 05/12/2008  . Obstructive sleep apnea 11/04/2007  . BACK PAIN, LUMBAR 10/23/2007  . Dyslipidemia 05/20/2007  . 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 Essential hypertension 05/20/2007    Sumner Boast., PT 10/10/2016, 8:33 AM  [address] [place], [place], [address] Phone: [phone]   Fax:  [phone]  Name: [name] MRN: [id number] Date of Birth: 1941/09/24

## 2016-10-12 ENCOUNTER — Ambulatory Visit: Payer: Medicare Other | Admitting: Physical Therapy

## 2016-10-13 ENCOUNTER — Ambulatory Visit: Payer: Medicare Other | Admitting: Physical Therapy

## 2016-10-13 ENCOUNTER — Encounter: Payer: Self-pay | Admitting: Physical Therapy

## 2016-10-13 DIAGNOSIS — M62830 Muscle spasm of back: Secondary | ICD-10-CM

## 2016-10-13 DIAGNOSIS — M5442 Lumbago with sciatica, left side: Secondary | ICD-10-CM | POA: Diagnosis not present

## 2016-10-13 DIAGNOSIS — M542 Cervicalgia: Secondary | ICD-10-CM

## 2016-10-13 DIAGNOSIS — G8929 Other chronic pain: Secondary | ICD-10-CM

## 2016-10-13 NOTE — Therapy (Signed)
[place] [address] [place], [place], [address] Phone: [phone]   Fax:  [phone]  Physical Therapy Treatment  Patient Details  Name: [name] MRN: [id number] Date of Birth: January 19, 1941 Referring Provider: Erlinda Hong  Encounter Date: 10/13/2016      PT End of Session - 10/13/16 0925    Visit Number 6   Date for PT Re-Evaluation 11/26/16   PT Start Time 0839   PT Stop Time 0938   PT Time Calculation (min) 59 min   Activity Tolerance Patient tolerated treatment well   Behavior During Therapy [facility] for tasks assessed/performed      Past Medical History:  Diagnosis Date  . Allergy   . Anal fissure   . Anemia, unspecified   . Arthritis    Spinal OA  . BACK PAIN, LUMBAR 10/23/2007  . CARDIAC MURMUR, SYSTOLIC 05/24/5052  . DIABETES MELLITUS, TYPE I 05/20/2007  . DIASTOLIC DYSFUNCTION 9/76/7341  . 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[place])   . Duodenitis   . Edema 05/12/2008  . Esophageal stricture   . GERD (gastroesophageal reflux disease)   . 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 Gynecomastia   . Hepatic steatosis   . HYPERLIPIDEMIA 05/20/2007  . Hypertension   . Hypogonadism male   . Morbid obesity ([place]) 09/10/2009  . OBSTRUCTIVE SLEEP APNEA 11/04/2007  . Personal history of colonic polyps   . RENAL INSUFFICIENCY 05/12/2008  . Sleep apnea    uses cpap  . Urolithiasis     Past Surgical History:  Procedure Laterality Date  . Abdominal US  09/1997  . arm fracture Left 1958   with hardware  . Colon cancer screening    . COLONOSCOPY  08/18/2004   diverticulitis  . COLONOSCOPY  09/24/2009  . ELECTROCARDIOGRAM  02/2006  . FLEXIBLE SIGMOIDOSCOPY  03/04/2001   polyps, anal fissure  . GANGLION CYST EXCISION Left 1980  . Lower Arterial  04/13/2004  . POLYPECTOMY    . Rest Cardiolite  03/19/2003    There were no vitals filed for this visit.      Subjective Assessment - 10/13/16 0853    Subjective I like the  exercises I feel a little better, just hurt and am fatigued   Currently in Pain? Yes   Pain Score 3    Pain Location Back   Pain Orientation Mid;Lower   Pain Descriptors / Indicators Aching                         [facility] Adult PT Treatment/Exercise - 10/13/16 0001      Lumbar Exercises: Aerobic   Elliptical NuStep Level 5 x 6 minutes   Tread Mill bike x 5 minutes     Lumbar Exercises: Machines for Strengthening   Cybex Knee Extension 5# 2x10   Cybex Knee Flexion 25# 2x15   Leg Press 20# 2x10, then 40# 2x10   Other Lumbar Machine Exercise seated row 15#, lats 20# 2x15, chest press 10#   Other Lumbar Machine Exercise straight arm pull downs with cues to tighten abs 2x15     Lumbar Exercises: Standing   Other Standing Lumbar Exercises standing 3# hip abduction and extension     Moist Heat Therapy   Number Minutes Moist Heat 15 Minutes   Moist Heat Location Lumbar Spine     Electrical Stimulation   Electrical Stimulation Location Lumbar   Electrical Stimulation Action IFC   Electrical Stimulation Parameters  sitting   Electrical Stimulation Goals Pain                  PT Short Term Goals - 10/02/16 1400      PT SHORT TERM GOAL #1   Title independent with initial HEP   Status Achieved           PT Long Term Goals - 10/10/16 2094      PT LONG TERM GOAL #1   Title decrease pain 50%   Status On-going     PT LONG TERM GOAL #2   Title tolerate standing >5 minutes wihtout pain >4/10   Status On-going     PT LONG TERM GOAL #3   Title increase lumbar ROM 25%   Status On-going     PT LONG TERM GOAL #4   Title understand proper posture and body mechanics   Status On-going               Plan - 10/13/16 0925    Clinical Impression Statement Doing very well with the exercises, he just continues to have pretty high pain levels "all over"   PT Next Visit Plan slowly add exercises   Consulted and Agree with Plan of Care Patient       Patient will benefit from skilled therapeutic intervention in order to improve the following deficits and impairments:  Decreased activity tolerance, Decreased mobility, Decreased strength, Decreased range of motion, Difficulty walking, Impaired flexibility, Increased muscle spasms, Improper body mechanics  Visit Diagnosis: Chronic midline low back pain with left-sided sciatica  Muscle spasm of back  Cervicalgia     Problem List Patient Active Problem List   Diagnosis Date Noted  . Radiculitis, lumbosacral 06/30/2016  . Diabetes ([place]) 01/01/2016  . Solitary pulmonary nodule 12/30/2015  . Urolithiasis 02/23/2015  . Hearing loss 11/25/2014  . Paresthesia 09/07/2014  . Routine general medical examination at a health care facility 07/07/2014  . Disturbance of skin sensation 12/26/2013  . UTI (urinary tract infection) 03/12/2013  . Screening for prostate cancer 02/14/2012  . Right knee pain 11/15/2011  . Encounter for long-term (current) use of other medications 01/17/2011  . Special screening examination for neoplasm of prostate 01/17/2011  . MYCOSIS FUNGOIDES 12/29/2009  . HEARING LOSS 09/30/2009  . ECZEMA 09/30/2009  . VITAMIN B12 DEFICIENCY 09/10/2009  . MORBID OBESITY 09/10/2009  . SYNCOPE 05/03/2009  . DIASTOLIC DYSFUNCTION 70/96/2836  . CARDIAC MURMUR, SYSTOLIC 62/94/7654  . Pancytopenia ([place]) 05/12/2008  . Disorder resulting from impaired renal function 05/12/2008  . EDEMA 05/12/2008  . Obstructive sleep apnea 11/04/2007  . BACK PAIN, LUMBAR 10/23/2007  . Dyslipidemia 05/20/2007  . 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 Essential hypertension 05/20/2007    Sumner Boast., PT 10/13/2016, 9:27 AM  [address] [place], [place], [address] Phone: [phone]   Fax:  [phone]  Name: Oluwatimileyin Vivier MRN: [id number] Date of Birth: 1941/08/10

## 2016-10-18 ENCOUNTER — Encounter: Payer: Self-pay | Admitting: Physical Therapy

## 2016-10-18 ENCOUNTER — Ambulatory Visit: Payer: Medicare Other | Admitting: Physical Therapy

## 2016-10-18 DIAGNOSIS — M5442 Lumbago with sciatica, left side: Principal | ICD-10-CM

## 2016-10-18 DIAGNOSIS — M62830 Muscle spasm of back: Secondary | ICD-10-CM

## 2016-10-18 DIAGNOSIS — G8929 Other chronic pain: Secondary | ICD-10-CM

## 2016-10-18 DIAGNOSIS — M542 Cervicalgia: Secondary | ICD-10-CM

## 2016-10-18 NOTE — Therapy (Signed)
[address] [place], [place], [address] Phone: [phone]   Fax:  [phone]  Physical Therapy Treatment  Patient Details  Name: [name] MRN: [id number] Date of Birth: 16-Jan-1941 Referring Provider: Erlinda Hong  Encounter Date: 10/18/2016      PT End of Session - 10/18/16 1433    Visit Number 7   Date for PT Re-Evaluation 11/26/16   PT Start Time 1302   PT Stop Time 1400   PT Time Calculation (min) 58 min   Activity Tolerance Patient tolerated treatment well   Behavior During Therapy [facility] for tasks assessed/performed      Past Medical History:  Diagnosis Date  . Allergy   . Anal fissure   . Anemia, unspecified   . Arthritis    Spinal OA  . BACK PAIN, LUMBAR 10/23/2007  . CARDIAC MURMUR, SYSTOLIC 03/31/6294  . DIABETES MELLITUS, TYPE I 05/20/2007  . DIASTOLIC DYSFUNCTION 2/84/1324  . 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[place])   . Duodenitis   . Edema 05/12/2008  . Esophageal stricture   . GERD (gastroesophageal reflux disease)   . 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 Gynecomastia   . Hepatic steatosis   . HYPERLIPIDEMIA 05/20/2007  . Hypertension   . Hypogonadism male   . Morbid obesity ([place]) 09/10/2009  . OBSTRUCTIVE SLEEP APNEA 11/04/2007  . Personal history of colonic polyps   . RENAL INSUFFICIENCY 05/12/2008  . Sleep apnea    uses cpap  . Urolithiasis     Past Surgical History:  Procedure Laterality Date  . Abdominal US  09/1997  . arm fracture Left 1958   with hardware  . Colon cancer screening    . COLONOSCOPY  08/18/2004   diverticulitis  . COLONOSCOPY  09/24/2009  . ELECTROCARDIOGRAM  02/2006  . FLEXIBLE SIGMOIDOSCOPY  03/04/2001   polyps, anal fissure  . GANGLION CYST EXCISION Left 1980  . Lower Arterial  04/13/2004  . POLYPECTOMY    . Rest Cardiolite  03/19/2003    There were no vitals filed for this visit.      Subjective Assessment - 10/18/16 1306    Subjective My back is feeling  better, my neck is still hurting   Currently in Pain? Yes   Pain Score 3    Pain Location Neck   Aggravating Factors  neck just hurts   Pain Relieving Factors treatment helps some                         [facility] Adult PT Treatment/Exercise - 10/18/16 0001      Lumbar Exercises: Aerobic   Elliptical NuStep Level 6 x 6 minutes   UBE (Upper Arm Bike) level 7 x 4 minutes     Lumbar Exercises: Machines for Strengthening   Cybex Knee Extension 10# 2x10   Cybex Knee Flexion 35# 2x15   Leg Press 40# 2x10   Other Lumbar Machine Exercise seated row 15#, lats 20# 2x15, chest press 10#     Moist Heat Therapy   Number Minutes Moist Heat 15 Minutes   Moist Heat Location Cervical;Lumbar Spine     Electrical Stimulation   Electrical Stimulation Location c/t area   Electrical Stimulation Action IFC   Electrical Stimulation Parameters sitting   Electrical Stimulation Goals Pain                  PT Short Term Goals - 10/02/16 1400  PT SHORT TERM GOAL #1   Title independent with initial HEP   Status Achieved           PT Long Term Goals - 10/10/16 9753      PT LONG TERM GOAL #1   Title decrease pain 50%   Status On-going     PT LONG TERM GOAL #2   Title tolerate standing >5 minutes wihtout pain >4/10   Status On-going     PT LONG TERM GOAL #3   Title increase lumbar ROM 25%   Status On-going     PT LONG TERM GOAL #4   Title understand proper posture and body mechanics   Status On-going               Plan - 10/18/16 1433    Clinical Impression Statement Reports going to have MRI of the knee this week.  He reports that his back is feeling better, he did not feel that the traction helped the neck but we only did it one visit.     PT Next Visit Plan continue to add exercises for core and endurance   Consulted and Agree with Plan of Care Patient      Patient will benefit from skilled therapeutic intervention in order to improve the  following deficits and impairments:  Decreased activity tolerance, Decreased mobility, Decreased strength, Decreased range of motion, Difficulty walking, Impaired flexibility, Increased muscle spasms, Improper body mechanics  Visit Diagnosis: Chronic midline low back pain with left-sided sciatica  Muscle spasm of back  Cervicalgia     Problem List Patient Active Problem List   Diagnosis Date Noted  . Radiculitis, lumbosacral 06/30/2016  . Diabetes ([place]) 01/01/2016  . Solitary pulmonary nodule 12/30/2015  . Urolithiasis 02/23/2015  . Hearing loss 11/25/2014  . Paresthesia 09/07/2014  . Routine general medical examination at a health care facility 07/07/2014  . Disturbance of skin sensation 12/26/2013  . UTI (urinary tract infection) 03/12/2013  . Screening for prostate cancer 02/14/2012  . Right knee pain 11/15/2011  . Encounter for long-term (current) use of other medications 01/17/2011  . Special screening examination for neoplasm of prostate 01/17/2011  . MYCOSIS FUNGOIDES 12/29/2009  . HEARING LOSS 09/30/2009  . ECZEMA 09/30/2009  . VITAMIN B12 DEFICIENCY 09/10/2009  . MORBID OBESITY 09/10/2009  . SYNCOPE 05/03/2009  . DIASTOLIC DYSFUNCTION 00/51/1021  . CARDIAC MURMUR, SYSTOLIC 11/73/5670  . Pancytopenia ([place]) 05/12/2008  . Disorder resulting from impaired renal function 05/12/2008  . EDEMA 05/12/2008  . Obstructive sleep apnea 11/04/2007  . BACK PAIN, LUMBAR 10/23/2007  . Dyslipidemia 05/20/2007  . 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 Essential hypertension 05/20/2007    Sumner Boast., PT 10/18/2016, 2:38 PM  [address] [place], [place], [address] Phone: [phone]   Fax:  [phone]  Name: Warren Lindahl MRN: [id number] Date of Birth: 1941-01-06

## 2016-10-20 ENCOUNTER — Encounter: Payer: Self-pay | Admitting: Internal Medicine

## 2016-10-20 ENCOUNTER — Ambulatory Visit
Admission: RE | Admit: 2016-10-20 | Discharge: 2016-10-20 | Disposition: A | Discharge status: Home / Self Care | Payer: Medicare Other | Source: Ambulatory Visit | Attending: Orthopaedic Surgery | Admitting: Orthopaedic Surgery

## 2016-10-20 ENCOUNTER — Ambulatory Visit: Payer: Medicare Other | Admitting: Physical Therapy

## 2016-10-20 ENCOUNTER — Encounter: Payer: Self-pay | Admitting: Physical Therapy

## 2016-10-20 DIAGNOSIS — M5442 Lumbago with sciatica, left side: Secondary | ICD-10-CM | POA: Diagnosis not present

## 2016-10-20 DIAGNOSIS — M62830 Muscle spasm of back: Secondary | ICD-10-CM

## 2016-10-20 DIAGNOSIS — M25561 Pain in right knee: Principal | ICD-10-CM

## 2016-10-20 DIAGNOSIS — G8929 Other chronic pain: Secondary | ICD-10-CM

## 2016-10-20 DIAGNOSIS — M542 Cervicalgia: Secondary | ICD-10-CM

## 2016-10-20 NOTE — Therapy (Signed)
[address] [place], [place], [address] Phone: [phone]   Fax:  [phone]  Physical Therapy Treatment  Patient Details  Name: [name] MRN: [id number] Date of Birth: 09/11/1941 Referring Provider: Erlinda Hong  Encounter Date: 10/20/2016      PT End of Session - 10/20/16 1143    Visit Number 8   Date for PT Re-Evaluation 11/26/16   PT Start Time 1100   PT Stop Time 1159   PT Time Calculation (min) 59 min   Activity Tolerance Patient tolerated treatment well   Behavior During Therapy [facility] for tasks assessed/performed      Past Medical History:  Diagnosis Date  . Allergy   . Anal fissure   . Anemia, unspecified   . Arthritis    Spinal OA  . BACK PAIN, LUMBAR 10/23/2007  . CARDIAC MURMUR, SYSTOLIC 02/23/6143  . DIABETES MELLITUS, TYPE I 05/20/2007  . DIASTOLIC DYSFUNCTION 01/05/4007  . 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[place])   . Duodenitis   . Edema 05/12/2008  . Esophageal stricture   . GERD (gastroesophageal reflux disease)   . 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 Gynecomastia   . Hepatic steatosis   . HYPERLIPIDEMIA 05/20/2007  . Hypertension   . Hypogonadism male   . Morbid obesity ([place]) 09/10/2009  . OBSTRUCTIVE SLEEP APNEA 11/04/2007  . Personal history of colonic polyps   . RENAL INSUFFICIENCY 05/12/2008  . Sleep apnea    uses cpap  . Urolithiasis     Past Surgical History:  Procedure Laterality Date  . Abdominal US  09/1997  . arm fracture Left 1958   with hardware  . Colon cancer screening    . COLONOSCOPY  08/18/2004   diverticulitis  . COLONOSCOPY  09/24/2009  . ELECTROCARDIOGRAM  02/2006  . FLEXIBLE SIGMOIDOSCOPY  03/04/2001   polyps, anal fissure  . GANGLION CYST EXCISION Left 1980  . Lower Arterial  04/13/2004  . POLYPECTOMY    . Rest Cardiolite  03/19/2003    There were no vitals filed for this visit.      Subjective Assessment - 10/20/16 1052    Subjective Just tired today,  when I wake up I hurt all over.   Currently in Pain? Yes   Pain Score 3    Pain Location Neck   Aggravating Factors  first thing in the morning                         [facility] Adult PT Treatment/Exercise - 10/20/16 0001      Lumbar Exercises: Aerobic   Elliptical NuStep Level 6 x 7 minutes   UBE (Upper Arm Bike) constant work 15 watts 4 minutes     Lumbar Exercises: Machines for Strengthening   Cybex Knee Extension 10# 3x10   Cybex Knee Flexion 35# 3x15   Leg Press 40# 2x10   Other Lumbar Machine Exercise seated row 25#, lats 25# 2x15, chest press 10#   Other Lumbar Machine Exercise straight arm pull downs with cues to tighten abs 45lb 2x15     Lumbar Exercises: Standing   Other Standing Lumbar Exercises Rev griprows 2x15 35lb     Lumbar Exercises: Seated   Sit to Stand 5 reps  x3, UE support from knees      Moist Heat Therapy   Number Minutes Moist Heat 15 Minutes   Moist Heat Location Cervical     Electrical Stimulation  Electrical Stimulation Location c/t area   Chartered certified accountant IFC    Electrical Stimulation Parameters sitting    Electrical Stimulation Goals Pain                  PT Short Term Goals - 10/02/16 1400      PT SHORT TERM GOAL #1   Title independent with initial HEP   Status Achieved           PT Long Term Goals - 10/20/16 1147      PT LONG TERM GOAL #1   Title decrease pain 50%   Status Partially Met     PT LONG TERM GOAL #2   Title tolerate standing >5 minutes wihtout pain >4/10   Status On-going     PT LONG TERM GOAL #3   Title increase lumbar ROM 25%   Status On-going               Plan - 10/20/16 1148    Clinical Impression Statement Reports overall improvement with low back pain. Tolerated all exercise interventions well. Required cues to maintains proper posture during straight arm pull downs.   Rehab Potential Good   PT Frequency 2x / week   PT Duration 8 weeks   PT  Treatment/Interventions ADLs/Self Care Home Management;Electrical Stimulation;Moist Heat;Traction;Ultrasound;Patient/family education;Therapeutic exercise;Therapeutic activities;Manual techniques   PT Next Visit Plan continue to add exercises for core and endurance      Patient will benefit from skilled therapeutic intervention in order to improve the following deficits and impairments:  Decreased activity tolerance, Decreased mobility, Decreased strength, Decreased range of motion, Difficulty walking, Impaired flexibility, Increased muscle spasms, Improper body mechanics  Visit Diagnosis: Chronic midline low back pain with left-sided sciatica  Muscle spasm of back  Cervicalgia     Problem List Patient Active Problem List   Diagnosis Date Noted  . Radiculitis, lumbosacral 06/30/2016  . Diabetes ([place]) 01/01/2016  . Solitary pulmonary nodule 12/30/2015  . Urolithiasis 02/23/2015  . Hearing loss 11/25/2014  . Paresthesia 09/07/2014  . Routine general medical examination at a health care facility 07/07/2014  . Disturbance of skin sensation 12/26/2013  . UTI (urinary tract infection) 03/12/2013  . Screening for prostate cancer 02/14/2012  . Right knee pain 11/15/2011  . Encounter for long-term (current) use of other medications 01/17/2011  . Special screening examination for neoplasm of prostate 01/17/2011  . MYCOSIS FUNGOIDES 12/29/2009  . HEARING LOSS 09/30/2009  . ECZEMA 09/30/2009  . VITAMIN B12 DEFICIENCY 09/10/2009  . MORBID OBESITY 09/10/2009  . SYNCOPE 05/03/2009  . DIASTOLIC DYSFUNCTION 46/65/9935  . CARDIAC MURMUR, SYSTOLIC 70/17/7939  . Pancytopenia ([place]) 05/12/2008  . Disorder resulting from impaired renal function 05/12/2008  . EDEMA 05/12/2008  . Obstructive sleep apnea 11/04/2007  . BACK PAIN, LUMBAR 10/23/2007  . Dyslipidemia 05/20/2007  . 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 Essential hypertension 05/20/2007    [name], PTA 10/20/2016, 11:49 AM  [place] [address] [place], [place], [address] Phone: [phone]   Fax:  [phone]  Name: Emmanual Gauthreaux MRN: [id number] Date of Birth: Jun 12, 1941

## 2016-10-25 ENCOUNTER — Encounter: Payer: Self-pay | Admitting: Physical Therapy

## 2016-10-25 ENCOUNTER — Ambulatory Visit: Payer: Medicare Other | Attending: Physical Medicine & Rehabilitation | Admitting: Physical Therapy

## 2016-10-25 DIAGNOSIS — G8929 Other chronic pain: Secondary | ICD-10-CM | POA: Diagnosis present

## 2016-10-25 DIAGNOSIS — M5442 Lumbago with sciatica, left side: Secondary | ICD-10-CM | POA: Insufficient documentation

## 2016-10-25 DIAGNOSIS — M62830 Muscle spasm of back: Secondary | ICD-10-CM | POA: Diagnosis present

## 2016-10-25 DIAGNOSIS — M542 Cervicalgia: Secondary | ICD-10-CM | POA: Insufficient documentation

## 2016-10-25 NOTE — Therapy (Signed)
[place], [place], [address] Phone: [phone]   Fax:  [phone]  Physical Therapy Treatment  Patient Details  Name: [name] MRN: [id number] Date of Birth: 12-11-1940 Referring Provider: Erlinda Hong  Encounter Date: 10/25/2016      PT End of Session - 10/25/16 1349    Visit Number 9   Date for PT Re-Evaluation 11/26/16   PT Start Time 6440   PT Stop Time 1403   PT Time Calculation (min) 58 min   Activity Tolerance Patient tolerated treatment well   Behavior During Therapy [facility] for tasks assessed/performed      Past Medical History:  Diagnosis Date  . Allergy   . Anal fissure   . Anemia, unspecified   . Arthritis    Spinal OA  . BACK PAIN, LUMBAR 10/23/2007  . CARDIAC MURMUR, SYSTOLIC 12/25/7423  . DIABETES MELLITUS, TYPE I 05/20/2007  . DIASTOLIC DYSFUNCTION 9/56/3875  . 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[place])   . Duodenitis   . Edema 05/12/2008  . Esophageal stricture   . GERD (gastroesophageal reflux disease)   . 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 Gynecomastia   . Hepatic steatosis   . HYPERLIPIDEMIA 05/20/2007  . Hypertension   . Hypogonadism male   . Morbid obesity ([place]) 09/10/2009  . OBSTRUCTIVE SLEEP APNEA 11/04/2007  . Personal history of colonic polyps   . RENAL INSUFFICIENCY 05/12/2008  . Sleep apnea    uses cpap  . Urolithiasis     Past Surgical History:  Procedure Laterality Date  . Abdominal US  09/1997  . arm fracture Left 1958   with hardware  . Colon cancer screening    . COLONOSCOPY  08/18/2004   diverticulitis  . COLONOSCOPY  09/24/2009  . ELECTROCARDIOGRAM  02/2006  . FLEXIBLE SIGMOIDOSCOPY  03/04/2001   polyps, anal fissure  . GANGLION CYST EXCISION Left 1980  . Lower Arterial  04/13/2004  . POLYPECTOMY    . Rest Cardiolite  03/19/2003    There were no vitals filed for this visit.      Subjective Assessment - 10/25/16 1312    Subjective I really was tired  and sore after that last treatment.  Had an MRI last week. Reports he had increased LBP trying to get up from the MRI.   Currently in Pain? Yes   Pain Score 7    Pain Location Back   Pain Orientation Lower   Pain Descriptors / Indicators Aching;Sore   Pain Type Chronic pain   Aggravating Factors  trying to get up off the MRI table                         [facility] Adult PT Treatment/Exercise - 10/25/16 0001      Lumbar Exercises: Aerobic   Elliptical NuStep Level 6 x 7 minutes   UBE (Upper Arm Bike) constant work 20 watts 4 minutes     Lumbar Exercises: Machines for Strengthening   Other Lumbar Machine Exercise seated row 35#, lats 35# 2x15, chest press 10#   Other Lumbar Machine Exercise straight arm pull downs with cues to tighten abs 45lb 2x15, 35# triceps, 15# biceps     Lumbar Exercises: Standing   Other Standing Lumbar Exercises sit to stand with weighted ball     Moist Heat Therapy   Number Minutes Moist Heat 15 Minutes   Moist Heat Location Lumbar Spine  Theme park manager Lumbar spine   Electrical Stimulation Action IFC   Electrical Stimulation Parameters sitting   Electrical Stimulation Goals Pain                  PT Short Term Goals - 10/02/16 1400      PT SHORT TERM GOAL #1   Title independent with initial HEP   Status Achieved           PT Long Term Goals - 10/20/16 1147      PT LONG TERM GOAL #1   Title decrease pain 50%   Status Partially Met     PT LONG TERM GOAL #2   Title tolerate standing >5 minutes wihtout pain >4/10   Status On-going     PT LONG TERM GOAL #3   Title increase lumbar ROM 25%   Status On-going               Plan - 10/25/16 1350    Clinical Impression Statement Increased LBP after lying on MRI table for an extended period.  Reports that he is feeling tight and sore.   PT Next Visit Plan continue to add exercises for core and endurance   Consulted  and Agree with Plan of Care Patient      Patient will benefit from skilled therapeutic intervention in order to improve the following deficits and impairments:  Decreased activity tolerance, Decreased mobility, Decreased strength, Decreased range of motion, Difficulty walking, Impaired flexibility, Increased muscle spasms, Improper body mechanics  Visit Diagnosis: Chronic midline low back pain with left-sided sciatica  Muscle spasm of back  Cervicalgia     Problem List Patient Active Problem List   Diagnosis Date Noted  . Radiculitis, lumbosacral 06/30/2016  . Diabetes ([place]) 01/01/2016  . Solitary pulmonary nodule 12/30/2015  . Urolithiasis 02/23/2015  . Hearing loss 11/25/2014  . Paresthesia 09/07/2014  . Routine general medical examination at a health care facility 07/07/2014  . Disturbance of skin sensation 12/26/2013  . UTI (urinary tract infection) 03/12/2013  . Screening for prostate cancer 02/14/2012  . Right knee pain 11/15/2011  . Encounter for long-term (current) use of other medications 01/17/2011  . Special screening examination for neoplasm of prostate 01/17/2011  . MYCOSIS FUNGOIDES 12/29/2009  . HEARING LOSS 09/30/2009  . ECZEMA 09/30/2009  . VITAMIN B12 DEFICIENCY 09/10/2009  . MORBID OBESITY 09/10/2009  . SYNCOPE 05/03/2009  . DIASTOLIC DYSFUNCTION 41/32/4401  . CARDIAC MURMUR, SYSTOLIC 02/72/5366  . Pancytopenia ([place]) 05/12/2008  . Disorder resulting from impaired renal function 05/12/2008  . EDEMA 05/12/2008  . Obstructive sleep apnea 11/04/2007  . BACK PAIN, LUMBAR 10/23/2007  . Dyslipidemia 05/20/2007  . 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 Essential hypertension 05/20/2007    Sumner Boast., PT 10/25/2016, 2:38 PM  [place] [address] [place], [place], [address] Phone: [phone]   Fax:  [phone]  Name: Tawfiq Favila MRN: [id number] Date of Birth: 20-Jul-1941

## 2016-10-26 ENCOUNTER — Encounter (INDEPENDENT_AMBULATORY_CARE_PROVIDER_SITE_OTHER): Payer: Self-pay | Admitting: Orthopaedic Surgery

## 2016-10-26 ENCOUNTER — Telehealth: Payer: Self-pay | Admitting: Physical Therapy

## 2016-10-26 ENCOUNTER — Ambulatory Visit (INDEPENDENT_AMBULATORY_CARE_PROVIDER_SITE_OTHER): Payer: Medicare Other | Admitting: Orthopaedic Surgery

## 2016-10-26 DIAGNOSIS — S83271A Complex tear of lateral meniscus, current injury, right knee, initial encounter: Secondary | ICD-10-CM | POA: Insufficient documentation

## 2016-10-26 DIAGNOSIS — S83271D Complex tear of lateral meniscus, current injury, right knee, subsequent encounter: Secondary | ICD-10-CM | POA: Diagnosis not present

## 2016-10-26 NOTE — Progress Notes (Signed)
Office Visit Note   Patient: [name]           Date of Birth: [birth year]           MRN: [id number] Visit Date: 10/26/2016              Requested by: Renato Shin, MD [address] [place], [place] [address] PCP: Renato Shin, MD   Assessment & Plan: Visit Diagnoses:  1. Complex tear of lateral meniscus of right knee as current injury, subsequent encounter     Plan: MRI of the right knee reviewed shows chondromalacia compartment and lateral compartment which is mild. He does have a complex tear of the lateral meniscus around the posterior horn. We discussed treatment options including arthroscopic debridement which she would like to pursue given failure of conservative treatment. He understands risks benefits alternatives to surgery and wishes to proceed.  Follow-Up Instructions: Return for 2 week postop visit.   Orders:  No orders of the defined types were placed in this encounter.  No orders of the defined types were placed in this encounter.     Procedures: No procedures performed   Clinical Data: No additional findings.   Subjective: Chief Complaint  Patient presents with  . Right Knee - Pain, Follow-up    Patient is following up today for review his MRI. He continues to have constant pain in his knee on the medial side and a chemical symptoms of locking of the knee. His pain is constant.    Review of Systems   Objective: Vital Signs: There were no vitals taken for this visit.  Physical Exam  Ortho Exam Exam of the right knee is stable. Specialty Comments:  No specialty comments available.  Imaging: No results found.   PMFS History: Patient Active Problem List   Diagnosis Date Noted  . Complex tear of lateral meniscus of right knee as current injury 10/26/2016  . Radiculitis, lumbosacral 06/30/2016  . Diabetes ([place]) 01/01/2016  . Solitary pulmonary nodule 12/30/2015  . Urolithiasis 02/23/2015  . Hearing loss 11/25/2014  .  Paresthesia 09/07/2014  . Routine general medical examination at a health care facility 07/07/2014  . Disturbance of skin sensation 12/26/2013  . UTI (urinary tract infection) 03/12/2013  . Screening for prostate cancer 02/14/2012  . Right knee pain 11/15/2011  . Encounter for long-term (current) use of other medications 01/17/2011  . Special screening examination for neoplasm of prostate 01/17/2011  . MYCOSIS FUNGOIDES 12/29/2009  . HEARING LOSS 09/30/2009  . ECZEMA 09/30/2009  . VITAMIN B12 DEFICIENCY 09/10/2009  . MORBID OBESITY 09/10/2009  . SYNCOPE 05/03/2009  . DIASTOLIC DYSFUNCTION 19/41/7408  . CARDIAC MURMUR, SYSTOLIC 14/48/1856  . Pancytopenia ([place]) 05/12/2008  . Disorder resulting from impaired renal function 05/12/2008  . EDEMA 05/12/2008  . Obstructive sleep apnea 11/04/2007  . BACK PAIN, LUMBAR 10/23/2007  . Dyslipidemia 05/20/2007  . 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 Essential hypertension 05/20/2007   Past Medical History:  Diagnosis Date  . Allergy   . Anal fissure   . Anemia, unspecified   . Arthritis    Spinal OA  . BACK PAIN, LUMBAR 10/23/2007  . CARDIAC MURMUR, SYSTOLIC 12/21/4968  . DIABETES MELLITUS, TYPE I 05/20/2007  . DIASTOLIC DYSFUNCTION 2/63/7858  . 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[place])   . Duodenitis   . Edema 05/12/2008  . Esophageal stricture   . GERD (gastroesophageal reflux disease)   . 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 Gynecomastia   . Hepatic steatosis   .  HYPERLIPIDEMIA 05/20/2007  . Hypertension   . Hypogonadism male   . Morbid obesity ([place]) 09/10/2009  . OBSTRUCTIVE SLEEP APNEA 11/04/2007  . Personal history of colonic polyps   . RENAL INSUFFICIENCY 05/12/2008  . Sleep apnea    uses cpap  . Urolithiasis     Family History  Problem Relation Age of Onset  . Colon cancer Brother 31  . Colon polyps Brother   . Cancer Brother     lung cancer, deceased  . Other Mother     MVA, deceased 75s  . Healthy Daughter   . Healthy Son   . Rectal cancer  Neg Hx   . Stomach cancer Neg Hx     Past Surgical History:  Procedure Laterality Date  . Abdominal US  09/1997  . arm fracture Left 1958   with hardware  . Colon cancer screening    . COLONOSCOPY  08/18/2004   diverticulitis  . COLONOSCOPY  09/24/2009  . ELECTROCARDIOGRAM  02/2006  . FLEXIBLE SIGMOIDOSCOPY  03/04/2001   polyps, anal fissure  . GANGLION CYST EXCISION Left 1980  . Lower Arterial  04/13/2004  . POLYPECTOMY    . Rest Cardiolite  03/19/2003   Social History   Occupational History  .  [employer]   Social History Main Topics  . Smoking status: Former Smoker    Packs/day: 2.00    Years: 31.00    Types: Cigarettes    Quit date: 10/23/1982  . Smokeless tobacco: Never Used  . Alcohol use No  . Drug use: No  . Sexual activity: Not on file

## 2016-10-26 NOTE — Telephone Encounter (Signed)
10/26/16 patient cxl all PT appts, having surgery

## 2016-10-27 ENCOUNTER — Ambulatory Visit: Payer: Medicare Other | Admitting: Physical Therapy

## 2016-10-30 ENCOUNTER — Ambulatory Visit: Payer: Medicare Other | Admitting: Physical Therapy

## 2016-11-01 ENCOUNTER — Ambulatory Visit: Payer: Medicare Other | Admitting: Physical Therapy

## 2016-11-03 DIAGNOSIS — S83271A Complex tear of lateral meniscus, current injury, right knee, initial encounter: Secondary | ICD-10-CM

## 2016-11-04 ENCOUNTER — Encounter (INDEPENDENT_AMBULATORY_CARE_PROVIDER_SITE_OTHER): Payer: Self-pay | Admitting: Orthopaedic Surgery

## 2016-11-08 ENCOUNTER — Other Ambulatory Visit: Payer: Self-pay | Admitting: Endocrinology

## 2016-11-10 ENCOUNTER — Inpatient Hospital Stay (INDEPENDENT_AMBULATORY_CARE_PROVIDER_SITE_OTHER): Payer: Medicare Other | Admitting: Orthopaedic Surgery

## 2016-11-13 ENCOUNTER — Encounter: Payer: Self-pay | Admitting: Physical Therapy

## 2016-11-13 ENCOUNTER — Ambulatory Visit: Payer: Medicare Other | Admitting: Physical Therapy

## 2016-11-13 DIAGNOSIS — M5442 Lumbago with sciatica, left side: Principal | ICD-10-CM

## 2016-11-13 DIAGNOSIS — G8929 Other chronic pain: Secondary | ICD-10-CM

## 2016-11-13 DIAGNOSIS — M542 Cervicalgia: Secondary | ICD-10-CM

## 2016-11-13 DIAGNOSIS — M62830 Muscle spasm of back: Secondary | ICD-10-CM

## 2016-11-13 NOTE — Therapy (Signed)
[place], [place], [address] Phone: [phone]   Fax:  [phone]  Physical Therapy Treatment  Patient Details  Name: [name] MRN: [id number] Date of Birth: 12/31/1940 Referring Provider: Erlinda Hong  Encounter Date: 11/13/2016      PT End of Session - 11/13/16 0833    Visit Number 10   Date for PT Re-Evaluation 11/26/16   PT Start Time 0800   PT Stop Time 0850   PT Time Calculation (min) 50 min   Activity Tolerance Patient tolerated treatment well   Behavior During Therapy [facility] for tasks assessed/performed      Past Medical History:  Diagnosis Date  . Allergy   . Anal fissure   . Anemia, unspecified   . Arthritis    Spinal OA  . BACK PAIN, LUMBAR 10/23/2007  . CARDIAC MURMUR, SYSTOLIC 06/29/6733  . DIABETES MELLITUS, TYPE I 05/20/2007  . DIASTOLIC DYSFUNCTION 1/93/7902  . 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[place])   . Duodenitis   . Edema 05/12/2008  . Esophageal stricture   . GERD (gastroesophageal reflux disease)   . 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 Gynecomastia   . Hepatic steatosis   . HYPERLIPIDEMIA 05/20/2007  . Hypertension   . Hypogonadism male   . Morbid obesity ([place]) 09/10/2009  . OBSTRUCTIVE SLEEP APNEA 11/04/2007  . Personal history of colonic polyps   . RENAL INSUFFICIENCY 05/12/2008  . Sleep apnea    uses cpap  . Urolithiasis     Past Surgical History:  Procedure Laterality Date  . Abdominal US  09/1997  . arm fracture Left 1958   with hardware  . Colon cancer screening    . COLONOSCOPY  08/18/2004   diverticulitis  . COLONOSCOPY  09/24/2009  . ELECTROCARDIOGRAM  02/2006  . FLEXIBLE SIGMOIDOSCOPY  03/04/2001   polyps, anal fissure  . GANGLION CYST EXCISION Left 1980  . Lower Arterial  04/13/2004  . POLYPECTOMY    . Rest Cardiolite  03/19/2003    There were no vitals filed for this visit.      Subjective Assessment - 11/13/16 0800    Subjective Patient reports  that he had a right knee scope on 11/03/16.  He reports that he has been doing pretty good but had to take two weeks off for the surgery.  Reports that the back pain is a little worse due to the inactivity   Currently in Pain? Yes   Pain Score 5    Pain Location Back   Pain Orientation Lower   Aggravating Factors  trying to get up from lying down                         [facility] Adult PT Treatment/Exercise - 11/13/16 0001      Lumbar Exercises: Aerobic   Elliptical NuStep Level 6 x 7 minutes     Lumbar Exercises: Machines for Strengthening   Other Lumbar Machine Exercise seated row 35#, lats 35# 2x15, chest press 10#   Other Lumbar Machine Exercise straight arm pull downs with cues to tighten abs 45lb 2x15, 35# triceps, 15# biceps     Moist Heat Therapy   Number Minutes Moist Heat 15 Minutes   Moist Heat Location Lumbar Spine     Electrical Stimulation   Electrical Stimulation Location Lumbar spine   Electrical Stimulation Action IFC   Electrical Stimulation Parameters sitting   Electrical Stimulation Goals Pain  PT Short Term Goals - 10/02/16 1400      PT SHORT TERM GOAL #1   Title independent with initial HEP   Status Achieved           PT Long Term Goals - 11/18/2016 0836      PT LONG TERM GOAL #1   Title decrease pain 50%   Status Partially Met     PT LONG TERM GOAL #2   Title tolerate standing >5 minutes wihtout pain >4/10   Status Partially Met     PT LONG TERM GOAL #3   Title increase lumbar ROM 25%   Status Partially Met               Plan - 11-18-2016 0835    Clinical Impression Statement Patient had some increased LBP the last time I saw him from getting on and off an MRI table, he has since had a right knee scope and not doing anything over the past two weeks has increased some LBP, reports that his knee is doing okay   PT Next Visit Plan continue to add exercises for core and endurance   Consulted and  Agree with Plan of Care Patient      Patient will benefit from skilled therapeutic intervention in order to improve the following deficits and impairments:  Decreased activity tolerance, Decreased mobility, Decreased strength, Decreased range of motion, Difficulty walking, Impaired flexibility, Increased muscle spasms, Improper body mechanics  Visit Diagnosis: Chronic midline low back pain with left-sided sciatica  Muscle spasm of back  Cervicalgia       G-Codes - 18-Nov-2016 0836    Functional Assessment Tool Used foto 48% limitation   Functional Limitation Other PT primary   Other PT Primary Current Status (N4627) At least 40 percent but less than 60 percent impaired, limited or restricted   Other PT Primary Goal Status (O3500) At least 40 percent but less than 60 percent impaired, limited or restricted      Problem List Patient Active Problem List   Diagnosis Date Noted  . Complex tear of lateral meniscus of right knee as current injury 10/26/2016  . Radiculitis, lumbosacral 06/30/2016  . Diabetes ([place]) 01/01/2016  . Solitary pulmonary nodule 12/30/2015  . Urolithiasis 02/23/2015  . Hearing loss 11/25/2014  . Paresthesia 09/07/2014  . Routine general medical examination at a health care facility 07/07/2014  . Disturbance of skin sensation 12/26/2013  . UTI (urinary tract infection) 03/12/2013  . Screening for prostate cancer 02/14/2012  . Right knee pain 11/15/2011  . Encounter for long-term (current) use of other medications 01/17/2011  . Special screening examination for neoplasm of prostate 01/17/2011  . MYCOSIS FUNGOIDES 12/29/2009  . HEARING LOSS 09/30/2009  . ECZEMA 09/30/2009  . VITAMIN B12 DEFICIENCY 09/10/2009  . MORBID OBESITY 09/10/2009  . SYNCOPE 05/03/2009  . DIASTOLIC DYSFUNCTION 93/81/8299  . CARDIAC MURMUR, SYSTOLIC 37/16/9678  . Pancytopenia ([place]) 05/12/2008  . Disorder resulting from impaired renal function 05/12/2008  . EDEMA 05/12/2008  .  Obstructive sleep apnea 11/04/2007  . BACK PAIN, LUMBAR 10/23/2007  . Dyslipidemia 05/20/2007  . 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 Essential hypertension 05/20/2007    Sumner Boast., PT November 18, 2016, 8:37 AM  [address] [place], [place], [address] Phone: [phone]   Fax:  [phone]  Name: [name] MRN: [id number] Date of Birth: May 31, 1941

## 2016-11-15 ENCOUNTER — Encounter: Payer: Self-pay | Admitting: Physical Therapy

## 2016-11-15 ENCOUNTER — Ambulatory Visit: Payer: Medicare Other | Admitting: Physical Therapy

## 2016-11-15 DIAGNOSIS — M5442 Lumbago with sciatica, left side: Secondary | ICD-10-CM | POA: Diagnosis not present

## 2016-11-15 DIAGNOSIS — G8929 Other chronic pain: Secondary | ICD-10-CM

## 2016-11-15 DIAGNOSIS — M542 Cervicalgia: Secondary | ICD-10-CM

## 2016-11-15 DIAGNOSIS — M62830 Muscle spasm of back: Secondary | ICD-10-CM

## 2016-11-15 NOTE — Therapy (Signed)
[place], [place], [address] Phone: [phone]   Fax:  [phone]  Physical Therapy Treatment  Patient Details  Name: [name] MRN: [id number] Date of Birth: 24-Jan-1941 Referring Provider: Erlinda Hong  Encounter Date: 11/15/2016      PT End of Session - 11/15/16 1345    Visit Number 11   Date for PT Re-Evaluation 11/26/16   PT Start Time 1302   PT Stop Time 1400   PT Time Calculation (min) 58 min   Activity Tolerance Patient tolerated treatment well   Behavior During Therapy [facility] for tasks assessed/performed      Past Medical History:  Diagnosis Date  . Allergy   . Anal fissure   . Anemia, unspecified   . Arthritis    Spinal OA  . BACK PAIN, LUMBAR 10/23/2007  . CARDIAC MURMUR, SYSTOLIC 10/25/2438  . DIABETES MELLITUS, TYPE I 05/20/2007  . DIASTOLIC DYSFUNCTION 10/25/7251  . 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[place])   . Duodenitis   . Edema 05/12/2008  . Esophageal stricture   . GERD (gastroesophageal reflux disease)   . 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 Gynecomastia   . Hepatic steatosis   . HYPERLIPIDEMIA 05/20/2007  . Hypertension   . Hypogonadism male   . Morbid obesity ([place]) 09/10/2009  . OBSTRUCTIVE SLEEP APNEA 11/04/2007  . Personal history of colonic polyps   . RENAL INSUFFICIENCY 05/12/2008  . Sleep apnea    uses cpap  . Urolithiasis     Past Surgical History:  Procedure Laterality Date  . Abdominal US  09/1997  . arm fracture Left 1958   with hardware  . Colon cancer screening    . COLONOSCOPY  08/18/2004   diverticulitis  . COLONOSCOPY  09/24/2009  . ELECTROCARDIOGRAM  02/2006  . FLEXIBLE SIGMOIDOSCOPY  03/04/2001   polyps, anal fissure  . GANGLION CYST EXCISION Left 1980  . Lower Arterial  04/13/2004  . POLYPECTOMY    . Rest Cardiolite  03/19/2003    There were no vitals filed for this visit.      Subjective Assessment - 11/15/16 1302    Subjective My knee is still  doing pretty good.  Back still hurts some.   Currently in Pain? Yes   Pain Score 4    Pain Location Back   Pain Orientation Lower   Pain Descriptors / Indicators Aching                         [facility] Adult PT Treatment/Exercise - 11/15/16 0001      Lumbar Exercises: Stretches   Passive Hamstring Stretch 3 reps;20 seconds   Single Knee to Chest Stretch 10 seconds;5 reps   Lower Trunk Rotation 5 reps;10 seconds   Piriformis Stretch 5 reps;10 seconds     Lumbar Exercises: Aerobic   Elliptical NuStep Level 6 x 7 minutes   UBE (Upper Arm Bike) constant work 20 watts 4 minutes     Lumbar Exercises: Machines for Strengthening   Other Lumbar Machine Exercise seated row 35#, lats 35# 2x15, chest press 10#   Other Lumbar Machine Exercise straight arm pull downs with cues to tighten abs 45lb 2x15, 35# triceps, 15# biceps     Lumbar Exercises: Standing   Other Standing Lumbar Exercises standing 3# hip abduction and extension   Other Standing Lumbar Exercises weighted ball overhead reach and trunk rotation     Moist Heat Therapy  Number Minutes Moist Heat 15 Minutes   Moist Heat Location Lumbar Spine     Electrical Stimulation   Electrical Stimulation Location Lumbar spine   Electrical Stimulation Action IFC   Electrical Stimulation Parameters sitting   Electrical Stimulation Goals Pain                  PT Short Term Goals - 10/02/16 1400      PT SHORT TERM GOAL #1   Title independent with initial HEP   Status Achieved           PT Long Term Goals - 11/15/16 1347      PT LONG TERM GOAL #1   Title decrease pain 50%   Status Partially Met     PT LONG TERM GOAL #4   Title understand proper posture and body mechanics   Status Achieved               Plan - 11/15/16 1346    Clinical Impression Statement Patient reports that the exercises feel good and make him feel stronger.  The low back does better with the heat and the estim   PT Next  Visit Plan continue to add exercises for core and endurance, may add some leg exercises back into the routine   Consulted and Agree with Plan of Care Patient      Patient will benefit from skilled therapeutic intervention in order to improve the following deficits and impairments:  Decreased activity tolerance, Decreased mobility, Decreased strength, Decreased range of motion, Difficulty walking, Impaired flexibility, Increased muscle spasms, Improper body mechanics  Visit Diagnosis: Chronic midline low back pain with left-sided sciatica  Muscle spasm of back  Cervicalgia     Problem List Patient Active Problem List   Diagnosis Date Noted  . Complex tear of lateral meniscus of right knee as current injury 10/26/2016  . Radiculitis, lumbosacral 06/30/2016  . Diabetes ([place]) 01/01/2016  . Solitary pulmonary nodule 12/30/2015  . Urolithiasis 02/23/2015  . Hearing loss 11/25/2014  . Paresthesia 09/07/2014  . Routine general medical examination at a health care facility 07/07/2014  . Disturbance of skin sensation 12/26/2013  . UTI (urinary tract infection) 03/12/2013  . Screening for prostate cancer 02/14/2012  . Right knee pain 11/15/2011  . Encounter for long-term (current) use of other medications 01/17/2011  . Special screening examination for neoplasm of prostate 01/17/2011  . MYCOSIS FUNGOIDES 12/29/2009  . HEARING LOSS 09/30/2009  . ECZEMA 09/30/2009  . VITAMIN B12 DEFICIENCY 09/10/2009  . MORBID OBESITY 09/10/2009  . SYNCOPE 05/03/2009  . DIASTOLIC DYSFUNCTION 65/99/3570  . CARDIAC MURMUR, SYSTOLIC 17/79/3903  . Pancytopenia ([place]) 05/12/2008  . Disorder resulting from impaired renal function 05/12/2008  . EDEMA 05/12/2008  . Obstructive sleep apnea 11/04/2007  . BACK PAIN, LUMBAR 10/23/2007  . Dyslipidemia 05/20/2007  . 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 Essential hypertension 05/20/2007    Sumner Boast., PT 11/15/2016, 1:48 PM  [address] [place], [place], [address] Phone: [phone]   Fax:  [phone]  Name: [name] MRN: [id number] Date of Birth: 02-Dec-1940

## 2016-11-17 ENCOUNTER — Ambulatory Visit (INDEPENDENT_AMBULATORY_CARE_PROVIDER_SITE_OTHER): Payer: Medicare Other | Admitting: Orthopaedic Surgery

## 2016-11-17 ENCOUNTER — Encounter (INDEPENDENT_AMBULATORY_CARE_PROVIDER_SITE_OTHER): Payer: Self-pay | Admitting: Orthopaedic Surgery

## 2016-11-17 DIAGNOSIS — S83271D Complex tear of lateral meniscus, current injury, right knee, subsequent encounter: Secondary | ICD-10-CM

## 2016-11-17 DIAGNOSIS — M1711 Unilateral primary osteoarthritis, right knee: Secondary | ICD-10-CM

## 2016-11-17 NOTE — Progress Notes (Signed)
Mr. Orrego is 2 weeks status post knee arthroscopy with partial lateral meniscectomy and debridement. He is overall doing well and doing physical therapy. He is very active. He just endorses some mild pain. He feels that he is doing better. The sutures are intact with heels incisions. No effusion. Excellent range of motion. The sutures were removed today. Home exercises were given. 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 His x-rays actually dull have severe degenerative changes yet. Patient is doing okay at this point. I'll see him back as needed.

## 2016-11-21 ENCOUNTER — Ambulatory Visit: Payer: Medicare Other | Admitting: Physical Therapy

## 2016-11-21 ENCOUNTER — Encounter: Payer: Self-pay | Admitting: Physical Therapy

## 2016-11-21 DIAGNOSIS — M5442 Lumbago with sciatica, left side: Principal | ICD-10-CM

## 2016-11-21 DIAGNOSIS — G8929 Other chronic pain: Secondary | ICD-10-CM

## 2016-11-21 DIAGNOSIS — M62830 Muscle spasm of back: Secondary | ICD-10-CM

## 2016-11-21 DIAGNOSIS — M542 Cervicalgia: Secondary | ICD-10-CM

## 2016-11-21 NOTE — Therapy (Signed)
[place], [place], [address] Phone: [phone]   Fax:  [phone]  Physical Therapy Treatment  Patient Details  Name: [name] MRN: [id number] Date of Birth: 1941/06/24 Referring Provider: Erlinda Hong  Encounter Date: 11/21/2016      PT End of Session - 11/21/16 1440    Visit Number 12   Date for PT Re-Evaluation 11/26/16   PT Start Time 1352   PT Stop Time 1450   PT Time Calculation (min) 58 min   Activity Tolerance Patient tolerated treatment well   Behavior During Therapy [facility] for tasks assessed/performed      Past Medical History:  Diagnosis Date  . Allergy   . Anal fissure   . Anemia, unspecified   . Arthritis    Spinal OA  . BACK PAIN, LUMBAR 10/23/2007  . CARDIAC MURMUR, SYSTOLIC 12/24/9177  . DIABETES MELLITUS, TYPE I 05/20/2007  . DIASTOLIC DYSFUNCTION 1/50/5697  . 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[place])   . Duodenitis   . Edema 05/12/2008  . Esophageal stricture   . GERD (gastroesophageal reflux disease)   . 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 Gynecomastia   . Hepatic steatosis   . HYPERLIPIDEMIA 05/20/2007  . Hypertension   . Hypogonadism male   . Morbid obesity ([place]) 09/10/2009  . OBSTRUCTIVE SLEEP APNEA 11/04/2007  . Personal history of colonic polyps   . RENAL INSUFFICIENCY 05/12/2008  . Sleep apnea    uses cpap  . Urolithiasis     Past Surgical History:  Procedure Laterality Date  . Abdominal US  09/1997  . arm fracture Left 1958   with hardware  . Colon cancer screening    . COLONOSCOPY  08/18/2004   diverticulitis  . COLONOSCOPY  09/24/2009  . ELECTROCARDIOGRAM  02/2006  . FLEXIBLE SIGMOIDOSCOPY  03/04/2001   polyps, anal fissure  . GANGLION CYST EXCISION Left 1980  . Lower Arterial  04/13/2004  . POLYPECTOMY    . Rest Cardiolite  03/19/2003    There were no vitals filed for this visit.      Subjective Assessment - 11/21/16 1404    Subjective Patient reports  that last night he felt like his knee got locked up when he was sleeping   Currently in Pain? Yes   Pain Score 3    Pain Location Back   Pain Orientation Lower   Pain Descriptors / Indicators Aching                         [facility] Adult PT Treatment/Exercise - 11/21/16 0001      Lumbar Exercises: Aerobic   Elliptical NuStep Level 6 x 8 minutes   UBE (Upper Arm Bike) constant work 20 watts 4 minutes     Lumbar Exercises: Machines for Strengthening   Cybex Knee Flexion 35# 3x15   Other Lumbar Machine Exercise seated row 35#, lats 35# 2x15, chest press 10#   Other Lumbar Machine Exercise straight arm pull downs with cues to tighten abs 45lb 2x15, 35# triceps, 15# biceps     Lumbar Exercises: Standing   Other Standing Lumbar Exercises 5# hip extension and abduction   Other Standing Lumbar Exercises weighted ball overhead reach and trunk rotation     Moist Heat Therapy   Number Minutes Moist Heat 15 Minutes   Moist Heat Location Lumbar Spine     Electrical Stimulation   Electrical Stimulation Location Lumbar spine  Electrical Stimulation Action IFC   Electrical Stimulation Parameters sitting   Electrical Stimulation Goals Pain                  PT Short Term Goals - 10/02/16 1400      PT SHORT TERM GOAL #1   Title independent with initial HEP   Status Achieved           PT Long Term Goals - 11/15/16 1347      PT LONG TERM GOAL #1   Title decrease pain 50%   Status Partially Met     PT LONG TERM GOAL #4   Title understand proper posture and body mechanics   Status Achieved               Plan - 11/21/16 1440    Clinical Impression Statement Reports some pain in the knee with sleeping last night, he did not have any increase of pain with any of the activities.     PT Next Visit Plan continue to add exercises for core and endurance, may add some leg exercises back into the routine   Consulted and Agree with Plan of Care Patient       Patient will benefit from skilled therapeutic intervention in order to improve the following deficits and impairments:  Decreased activity tolerance, Decreased mobility, Decreased strength, Decreased range of motion, Difficulty walking, Impaired flexibility, Increased muscle spasms, Improper body mechanics  Visit Diagnosis: Chronic midline low back pain with left-sided sciatica  Muscle spasm of back  Cervicalgia     Problem List Patient Active Problem List   Diagnosis Date Noted  . Unilateral primary osteoarthritis, right knee 11/17/2016  . Complex tear of lateral meniscus of right knee as current injury 10/26/2016  . Radiculitis, lumbosacral 06/30/2016  . Diabetes ([place]) 01/01/2016  . Solitary pulmonary nodule 12/30/2015  . Urolithiasis 02/23/2015  . Hearing loss 11/25/2014  . Paresthesia 09/07/2014  . Routine general medical examination at a health care facility 07/07/2014  . Disturbance of skin sensation 12/26/2013  . UTI (urinary tract infection) 03/12/2013  . Screening for prostate cancer 02/14/2012  . Right knee pain 11/15/2011  . Encounter for long-term (current) use of other medications 01/17/2011  . Special screening examination for neoplasm of prostate 01/17/2011  . MYCOSIS FUNGOIDES 12/29/2009  . HEARING LOSS 09/30/2009  . ECZEMA 09/30/2009  . VITAMIN B12 DEFICIENCY 09/10/2009  . MORBID OBESITY 09/10/2009  . SYNCOPE 05/03/2009  . DIASTOLIC DYSFUNCTION 17/00/1749  . CARDIAC MURMUR, SYSTOLIC 44/96/7591  . Pancytopenia ([place]) 05/12/2008  . Disorder resulting from impaired renal function 05/12/2008  . EDEMA 05/12/2008  . Obstructive sleep apnea 11/04/2007  . BACK PAIN, LUMBAR 10/23/2007  . Dyslipidemia 05/20/2007  . 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 Essential hypertension 05/20/2007    Sumner Boast., PT 11/21/2016, 2:43 PM  [address] [place], [place], [address] Phone: [phone]   Fax:   [phone]  Name: [name] MRN: [id number] Date of Birth: 05/18/1941

## 2016-11-22 ENCOUNTER — Other Ambulatory Visit: Payer: Self-pay | Admitting: Endocrinology

## 2016-11-23 ENCOUNTER — Encounter: Payer: Self-pay | Admitting: Physical Therapy

## 2016-11-23 ENCOUNTER — Ambulatory Visit: Payer: Medicare Other | Attending: Physical Medicine & Rehabilitation | Admitting: Physical Therapy

## 2016-11-23 DIAGNOSIS — G8929 Other chronic pain: Secondary | ICD-10-CM

## 2016-11-23 DIAGNOSIS — M542 Cervicalgia: Secondary | ICD-10-CM | POA: Diagnosis present

## 2016-11-23 DIAGNOSIS — M5442 Lumbago with sciatica, left side: Secondary | ICD-10-CM | POA: Diagnosis present

## 2016-11-23 DIAGNOSIS — M62830 Muscle spasm of back: Secondary | ICD-10-CM | POA: Diagnosis present

## 2016-11-23 NOTE — Addendum Note (Signed)
Addended by: Sumner Boast on: 11/23/2016 03:31 PM   Modules accepted: Orders

## 2016-11-23 NOTE — Therapy (Signed)
[place], [place], [address] Phone: [phone]   Fax:  [phone]  Physical Therapy Treatment  Patient Details  Name: [name] MRN: [id number] Date of Birth: 12-01-1940 Referring Provider: Erlinda Hong  Encounter Date: 11/23/2016      PT End of Session - 11/23/16 1524    Visit Number 13   Date for PT Re-Evaluation 12/24/16   PT Start Time 1436   PT Stop Time 1535   PT Time Calculation (min) 59 min   Activity Tolerance Patient tolerated treatment well   Behavior During Therapy [facility] for tasks assessed/performed      Past Medical History:  Diagnosis Date  . Allergy   . Anal fissure   . Anemia, unspecified   . Arthritis    Spinal OA  . BACK PAIN, LUMBAR 10/23/2007  . CARDIAC MURMUR, SYSTOLIC 03/30/880  . DIABETES MELLITUS, TYPE I 05/20/2007  . DIASTOLIC DYSFUNCTION 10/25/1592  . 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[place])   . Duodenitis   . Edema 05/12/2008  . Esophageal stricture   . GERD (gastroesophageal reflux disease)   . 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 Gynecomastia   . Hepatic steatosis   . HYPERLIPIDEMIA 05/20/2007  . Hypertension   . Hypogonadism male   . Morbid obesity ([place]) 09/10/2009  . OBSTRUCTIVE SLEEP APNEA 11/04/2007  . Personal history of colonic polyps   . RENAL INSUFFICIENCY 05/12/2008  . Sleep apnea    uses cpap  . Urolithiasis     Past Surgical History:  Procedure Laterality Date  . Abdominal US  09/1997  . arm fracture Left 1958   with hardware  . Colon cancer screening    . COLONOSCOPY  08/18/2004   diverticulitis  . COLONOSCOPY  09/24/2009  . ELECTROCARDIOGRAM  02/2006  . FLEXIBLE SIGMOIDOSCOPY  03/04/2001   polyps, anal fissure  . GANGLION CYST EXCISION Left 1980  . Lower Arterial  04/13/2004  . POLYPECTOMY    . Rest Cardiolite  03/19/2003    There were no vitals filed for this visit.      Subjective Assessment - 11/23/16 1458    Subjective I think this is  helping, I feel stronger   Currently in Pain? Yes   Pain Score 3    Pain Location Back                         [facility] Adult PT Treatment/Exercise - 11/23/16 0001      Lumbar Exercises: Aerobic   Elliptical NuStep Level 6 x 8 minutes   UBE (Upper Arm Bike) constant work 25 watts 4 minutes     Lumbar Exercises: Machines for Strengthening   Cybex Knee Extension 5# 2x10   Cybex Knee Flexion 35# 3x15   Other Lumbar Machine Exercise seated row 35#, lats 35# 2x15, chest press 10#   Other Lumbar Machine Exercise straight arm pull downs with cues to tighten abs 45lb 2x15, 35# triceps, 15# biceps     Lumbar Exercises: Standing   Other Standing Lumbar Exercises weighted ball overhead reach and trunk rotation     Moist Heat Therapy   Number Minutes Moist Heat 15 Minutes   Moist Heat Location Cervical;Lumbar Spine     Electrical Stimulation   Electrical Stimulation Location cervical    Electrical Stimulation Action IFC   Electrical Stimulation Parameters sitting   Electrical Stimulation Goals Pain  PT Short Term Goals - 10/02/16 1400      PT SHORT TERM GOAL #1   Title independent with initial HEP   Status Achieved           PT Long Term Goals - 11/23/16 1527      PT LONG TERM GOAL #1   Title decrease pain 50%   Status Partially Met     PT LONG TERM GOAL #2   Title tolerate standing >5 minutes wihtout pain >4/10   Status Partially Met     PT LONG TERM GOAL #3   Title increase lumbar ROM 25%   Status Partially Met     PT LONG TERM GOAL #4   Title understand proper posture and body mechanics   Status Achieved               Plan - 11/23/16 1525    Clinical Impression Statement Patient overall reports feeling better, feeling stronger and increased ability to move without difficulty.  He has had the right knee surgery that we had to decrease some of the activities, he is feeling better with the knee now and we are  introducing the knee exercises back in.   PT Frequency 2x / week   PT Duration 4 weeks   PT Treatment/Interventions ADLs/Self Care Home Management;Electrical Stimulation;Moist Heat;Traction;Ultrasound;Patient/family education;Therapeutic exercise;Therapeutic activities;Manual techniques   PT Next Visit Plan continue to add exercises for core and endurance, may add some leg exercises back into the routine   Consulted and Agree with Plan of Care Patient      Patient will benefit from skilled therapeutic intervention in order to improve the following deficits and impairments:  Decreased activity tolerance, Decreased mobility, Decreased strength, Decreased range of motion, Difficulty walking, Impaired flexibility, Increased muscle spasms, Improper body mechanics  Visit Diagnosis: Chronic midline low back pain with left-sided sciatica  Muscle spasm of back  Cervicalgia     Problem List Patient Active Problem List   Diagnosis Date Noted  . Unilateral primary osteoarthritis, right knee 11/17/2016  . Complex tear of lateral meniscus of right knee as current injury 10/26/2016  . Radiculitis, lumbosacral 06/30/2016  . Diabetes ([place]) 01/01/2016  . Solitary pulmonary nodule 12/30/2015  . Urolithiasis 02/23/2015  . Hearing loss 11/25/2014  . Paresthesia 09/07/2014  . Routine general medical examination at a health care facility 07/07/2014  . Disturbance of skin sensation 12/26/2013  . UTI (urinary tract infection) 03/12/2013  . Screening for prostate cancer 02/14/2012  . Right knee pain 11/15/2011  . Encounter for long-term (current) use of other medications 01/17/2011  . Special screening examination for neoplasm of prostate 01/17/2011  . MYCOSIS FUNGOIDES 12/29/2009  . HEARING LOSS 09/30/2009  . ECZEMA 09/30/2009  . VITAMIN B12 DEFICIENCY 09/10/2009  . MORBID OBESITY 09/10/2009  . SYNCOPE 05/03/2009  . DIASTOLIC DYSFUNCTION 54/27/0623  . CARDIAC MURMUR, SYSTOLIC 76/28/3151  .  Pancytopenia ([place]) 05/12/2008  . Disorder resulting from impaired renal function 05/12/2008  . EDEMA 05/12/2008  . Obstructive sleep apnea 11/04/2007  . BACK PAIN, LUMBAR 10/23/2007  . Dyslipidemia 05/20/2007  . 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 Essential hypertension 05/20/2007    Sumner Boast., PT 11/23/2016, 3:28 PM  [place] [address] [place], [place], [address] Phone: [phone]   Fax:  [phone]  Name: Leyland Kenna MRN: [id number] Date of Birth: 01-17-1941

## 2016-11-28 ENCOUNTER — Encounter: Payer: Self-pay | Admitting: Physical Therapy

## 2016-11-28 ENCOUNTER — Ambulatory Visit: Payer: Medicare Other | Admitting: Physical Therapy

## 2016-11-28 DIAGNOSIS — G8929 Other chronic pain: Secondary | ICD-10-CM

## 2016-11-28 DIAGNOSIS — M542 Cervicalgia: Secondary | ICD-10-CM

## 2016-11-28 DIAGNOSIS — M62830 Muscle spasm of back: Secondary | ICD-10-CM

## 2016-11-28 DIAGNOSIS — M5442 Lumbago with sciatica, left side: Secondary | ICD-10-CM | POA: Diagnosis not present

## 2016-11-28 NOTE — Therapy (Signed)
[place], [place], [address] Phone: [phone]   Fax:  [phone]  Physical Therapy Treatment  Patient Details  Name: [name] MRN: [id number] Date of Birth: 1940-12-05 Referring Provider: Erlinda Key  Encounter Date: 11/28/2016      PT End of Session - 11/28/16 1650    Visit Number 14   Date for PT Re-Evaluation 12/24/16   PT Start Time 1350   PT Stop Time 1446   PT Time Calculation (min) 56 min   Activity Tolerance Patient tolerated treatment well   Behavior During Therapy [facility] for tasks assessed/performed      Past Medical History:  Diagnosis Date  . Allergy   . Anal fissure   . Anemia, unspecified   . Arthritis    Spinal OA  . BACK PAIN, LUMBAR 10/23/2007  . CARDIAC MURMUR, SYSTOLIC 02/27/2619  . DIABETES MELLITUS, TYPE I 05/20/2007  . DIASTOLIC DYSFUNCTION 3/55/9741  . 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[place])   . Duodenitis   . Edema 05/12/2008  . Esophageal stricture   . GERD (gastroesophageal reflux disease)   . 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 Gynecomastia   . Hepatic steatosis   . HYPERLIPIDEMIA 05/20/2007  . Hypertension   . Hypogonadism male   . Morbid obesity ([place]) 09/10/2009  . OBSTRUCTIVE SLEEP APNEA 11/04/2007  . Personal history of colonic polyps   . RENAL INSUFFICIENCY 05/12/2008  . Sleep apnea    uses cpap  . Urolithiasis     Past Surgical History:  Procedure Laterality Date  . Abdominal US  09/1997  . arm fracture Left 1958   with hardware  . Colon cancer screening    . COLONOSCOPY  08/18/2004   diverticulitis  . COLONOSCOPY  09/24/2009  . ELECTROCARDIOGRAM  02/2006  . FLEXIBLE SIGMOIDOSCOPY  03/04/2001   polyps, anal fissure  . GANGLION CYST EXCISION Left 1980  . Lower Arterial  04/13/2004  . POLYPECTOMY    . Rest Cardiolite  03/19/2003    There were no vitals filed for this visit.      Subjective Assessment - 11/28/16 1350    Subjective I am a little  tired.  Still feeling better.  Patient reports a fall on Saturday, he reports he tripped.  Reports feels okay   Currently in Pain? Yes   Pain Score 3    Pain Location Back   Pain Orientation Lower   Aggravating Factors  getting up after sitting for a long period                         [facility] Adult PT Treatment/Exercise - 11/28/16 0001      High Level Balance   High Level Balance Comments resisted gait all directions, soccer kicks, obstacle step overs     Lumbar Exercises: Aerobic   Elliptical NuStep Level 6 x 8 minutes   UBE (Upper Arm Bike) constant work 25 watts 4 minutes     Lumbar Exercises: Machines for Strengthening   Cybex Knee Extension 5# 2x10   Cybex Knee Flexion 35# 3x15   Other Lumbar Machine Exercise seated row 35#, lats 35# 2x15, chest press 10#   Other Lumbar Machine Exercise straight arm pull downs with cues to tighten abs 45lb 2x15, 35# triceps, 15# biceps     Lumbar Exercises: Standing   Other Standing Lumbar Exercises weighted ball overhead reach and trunk rotation     Moist Heat  Therapy   Number Minutes Moist Heat 15 Minutes   Moist Heat Location Cervical;Lumbar Spine     Electrical Stimulation   Electrical Stimulation Location cervical    Electrical Stimulation Action IFC   Electrical Stimulation Parameters sitting   Electrical Stimulation Goals Pain                  PT Short Term Goals - 10/02/16 1400      PT SHORT TERM GOAL #1   Title independent with initial HEP   Status Achieved           PT Long Term Goals - 11/23/16 1527      PT LONG TERM GOAL #1   Title decrease pain 50%   Status Partially Met     PT LONG TERM GOAL #2   Title tolerate standing >5 minutes wihtout pain >4/10   Status Partially Met     PT LONG TERM GOAL #3   Title increase lumbar ROM 25%   Status Partially Met     PT LONG TERM GOAL #4   Title understand proper posture and body mechanics   Status Achieved               Plan  - 11/28/16 1651    Clinical Impression Statement Had a fall landed on knees, reports no changes for the worse with pain levels.  He is tolerating exercises well.  He reports that he has had multiple stumbles, added some balance exercises today.  He did well but did hold onto the wall a few time for balance   PT Next Visit Plan may add some balance activities   Consulted and Agree with Plan of Care Patient      Patient will benefit from skilled therapeutic intervention in order to improve the following deficits and impairments:  Decreased activity tolerance, Decreased mobility, Decreased strength, Decreased range of motion, Difficulty walking, Impaired flexibility, Increased muscle spasms, Improper body mechanics  Visit Diagnosis: Chronic midline low back pain with left-sided sciatica  Muscle spasm of back  Cervicalgia     Problem List Patient Active Problem List   Diagnosis Date Noted  . Unilateral primary osteoarthritis, right knee 11/17/2016  . Complex tear of lateral meniscus of right knee as current injury 10/26/2016  . Radiculitis, lumbosacral 06/30/2016  . Diabetes ([place]) 01/01/2016  . Solitary pulmonary nodule 12/30/2015  . Urolithiasis 02/23/2015  . Hearing loss 11/25/2014  . Paresthesia 09/07/2014  . Routine general medical examination at a health care facility 07/07/2014  . Disturbance of skin sensation 12/26/2013  . UTI (urinary tract infection) 03/12/2013  . Screening for prostate cancer 02/14/2012  . Right knee pain 11/15/2011  . Encounter for long-term (current) use of other medications 01/17/2011  . Special screening examination for neoplasm of prostate 01/17/2011  . MYCOSIS FUNGOIDES 12/29/2009  . HEARING LOSS 09/30/2009  . ECZEMA 09/30/2009  . VITAMIN B12 DEFICIENCY 09/10/2009  . MORBID OBESITY 09/10/2009  . SYNCOPE 05/03/2009  . DIASTOLIC DYSFUNCTION 07/29/1218  . CARDIAC MURMUR, SYSTOLIC 75/88/3254  . Pancytopenia ([place]) 05/12/2008  . Disorder resulting  from impaired renal function 05/12/2008  . EDEMA 05/12/2008  . Obstructive sleep apnea 11/04/2007  . BACK PAIN, LUMBAR 10/23/2007  . Dyslipidemia 05/20/2007  . 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 Essential hypertension 05/20/2007    Sumner Boast ., PT 11/28/2016, 4:52 PM  [address] [place], [place], [address] Phone: [phone]   Fax:  [phone]  Name: [name] MRN: [id number] Date of Birth: 05-09-1941

## 2016-12-01 ENCOUNTER — Encounter: Payer: Self-pay | Admitting: Physical Therapy

## 2016-12-01 ENCOUNTER — Ambulatory Visit: Payer: Medicare Other | Admitting: Physical Therapy

## 2016-12-01 DIAGNOSIS — M542 Cervicalgia: Secondary | ICD-10-CM

## 2016-12-01 DIAGNOSIS — M5442 Lumbago with sciatica, left side: Principal | ICD-10-CM

## 2016-12-01 DIAGNOSIS — M62830 Muscle spasm of back: Secondary | ICD-10-CM

## 2016-12-01 DIAGNOSIS — G8929 Other chronic pain: Secondary | ICD-10-CM

## 2016-12-01 NOTE — Therapy (Signed)
[place], [place], [address] Phone: [phone]   Fax:  [phone]  Physical Therapy Treatment  Patient Details  Name: [name] MRN: [id number] Date of Birth: 1941/10/13 Referring Provider: Erlinda Hong  Encounter Date: 12/01/2016      PT End of Session - 12/01/16 1040    Visit Number 15   Date for PT Re-Evaluation 12/24/16   PT Start Time 3244   PT Stop Time 1110   PT Time Calculation (min) 55 min      Past Medical History:  Diagnosis Date  . Allergy   . Anal fissure   . Anemia, unspecified   . Arthritis    Spinal OA  . BACK PAIN, LUMBAR 10/23/2007  . CARDIAC MURMUR, SYSTOLIC 0/10/270  . DIABETES MELLITUS, TYPE I 05/20/2007  . DIASTOLIC DYSFUNCTION 5/36/6440  . 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[place])   . Duodenitis   . Edema 05/12/2008  . Esophageal stricture   . GERD (gastroesophageal reflux disease)   . 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 Gynecomastia   . Hepatic steatosis   . HYPERLIPIDEMIA 05/20/2007  . Hypertension   . Hypogonadism male   . Morbid obesity ([place]) 09/10/2009  . OBSTRUCTIVE SLEEP APNEA 11/04/2007  . Personal history of colonic polyps   . RENAL INSUFFICIENCY 05/12/2008  . Sleep apnea    uses cpap  . Urolithiasis     Past Surgical History:  Procedure Laterality Date  . Abdominal US  09/1997  . arm fracture Left 1958   with hardware  . Colon cancer screening    . COLONOSCOPY  08/18/2004   diverticulitis  . COLONOSCOPY  09/24/2009  . ELECTROCARDIOGRAM  02/2006  . FLEXIBLE SIGMOIDOSCOPY  03/04/2001   polyps, anal fissure  . GANGLION CYST EXCISION Left 1980  . Lower Arterial  04/13/2004  . POLYPECTOMY    . Rest Cardiolite  03/19/2003    There were no vitals filed for this visit.      Subjective Assessment - 12/01/16 1012    Subjective feeling pretty good this morning   Currently in Pain? Yes   Pain Score 2    Pain Location Back   Pain Orientation Lower             [facility] PT Assessment - 12/01/16 0001      AROM   Overall AROM Comments WFLS except flexion decreased 25% with pain     Strength   Overall Strength Comments LE 4/5                     [facility] Adult PT Treatment/Exercise - 12/01/16 0001      Lumbar Exercises: Aerobic   Elliptical NuStep Level 6 x 8 minutes   UBE (Upper Arm Bike) constant work 25 watts 4 minutes     Lumbar Exercises: Machines for Strengthening   Cybex Lumbar Extension 35# standing 2 sets 10   Cybex Knee Extension 10# 2 sets 15   Cybex Knee Flexion 35# 2 x15   Other Lumbar Machine Exercise seated row 35#, lats 35# 2x15, chest press 10#   Other Lumbar Machine Exercise straight arm pull downs with cues to tighten abs 45lb 2x15, 35# triceps, 15# biceps                  PT Short Term Goals - 10/02/16 1400      PT SHORT TERM GOAL #1   Title independent with initial HEP  Status Achieved           PT Long Term Goals - 12/01/16 1037      PT LONG TERM GOAL #1   Title decrease pain 50%   Status Partially Met     PT LONG TERM GOAL #2   Title tolerate standing >5 minutes wihtout pain >4/10   Baseline pt verb 20 min   Status Achieved     PT LONG TERM GOAL #3   Title increase lumbar ROM 25%   Status Achieved     PT LONG TERM GOAL #4   Title understand proper posture and body mechanics   Status Achieved               Plan - 12/01/16 1038    Clinical Impression Statement LTG 2,3 and 4 met. Pt is building strength and increased activity tolerance. Chief c/o activity in standing leaning fwd ie washing dishes.   PT Next Visit Plan MD 2/14, next visit write note- prepare for D/C      Patient will benefit from skilled therapeutic intervention in order to improve the following deficits and impairments:  Decreased activity tolerance, Decreased mobility, Decreased strength, Decreased range of motion, Difficulty walking, Impaired flexibility, Increased muscle spasms,  Improper body mechanics  Visit Diagnosis: Chronic midline low back pain with left-sided sciatica  Muscle spasm of back  Cervicalgia     Problem List Patient Active Problem List   Diagnosis Date Noted  . Unilateral primary osteoarthritis, right knee 11/17/2016  . Complex tear of lateral meniscus of right knee as current injury 10/26/2016  . Radiculitis, lumbosacral 06/30/2016  . Diabetes ([place]) 01/01/2016  . Solitary pulmonary nodule 12/30/2015  . Urolithiasis 02/23/2015  . Hearing loss 11/25/2014  . Paresthesia 09/07/2014  . Routine general medical examination at a health care facility 07/07/2014  . Disturbance of skin sensation 12/26/2013  . UTI (urinary tract infection) 03/12/2013  . Screening for prostate cancer 02/14/2012  . Right knee pain 11/15/2011  . Encounter for long-term (current) use of other medications 01/17/2011  . Special screening examination for neoplasm of prostate 01/17/2011  . MYCOSIS FUNGOIDES 12/29/2009  . HEARING LOSS 09/30/2009  . ECZEMA 09/30/2009  . VITAMIN B12 DEFICIENCY 09/10/2009  . MORBID OBESITY 09/10/2009  . SYNCOPE 05/03/2009  . DIASTOLIC DYSFUNCTION 16/07/9603  . CARDIAC MURMUR, SYSTOLIC 54/06/8118  . Pancytopenia ([place]) 05/12/2008  . Disorder resulting from impaired renal function 05/12/2008  . EDEMA 05/12/2008  . Obstructive sleep apnea 11/04/2007  . BACK PAIN, LUMBAR 10/23/2007  . Dyslipidemia 05/20/2007  . 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 Essential hypertension 05/20/2007    [name],[name] PTA 12/01/2016, 10:42 AM  [address] [place], [place], [address] Phone: [phone]   Fax:  [phone]  Name: [name] MRN: [id number] Date of Birth: May 17, 1941

## 2016-12-05 ENCOUNTER — Ambulatory Visit: Payer: Medicare Other | Admitting: Physical Therapy

## 2016-12-05 DIAGNOSIS — M5442 Lumbago with sciatica, left side: Secondary | ICD-10-CM | POA: Diagnosis not present

## 2016-12-05 DIAGNOSIS — M542 Cervicalgia: Secondary | ICD-10-CM

## 2016-12-05 DIAGNOSIS — M62830 Muscle spasm of back: Secondary | ICD-10-CM

## 2016-12-05 DIAGNOSIS — G8929 Other chronic pain: Secondary | ICD-10-CM

## 2016-12-05 NOTE — Therapy (Signed)
[place], [place], [address] Phone: [phone]   Fax:  [phone]  Physical Therapy Treatment  Patient Details  Name: [name] MRN: [id number] Date of Birth: [birth year] Referring Provider: Erlinda Hong  Encounter Date: 12/05/2016      PT End of Session - 12/05/16 1034    Visit Number 16   Date for PT Re-Evaluation 12/24/16   PT Start Time 1020   PT Stop Time 1100   PT Time Calculation (min) 40 min   Activity Tolerance Patient tolerated treatment well   Behavior During Therapy [facility] for tasks assessed/performed      Past Medical History:  Diagnosis Date  . Allergy   . Anal fissure   . Anemia, unspecified   . Arthritis    Spinal OA  . BACK PAIN, LUMBAR 10/23/2007  . CARDIAC MURMUR, SYSTOLIC 0/02/3975  . DIABETES MELLITUS, TYPE I 05/20/2007  . DIASTOLIC DYSFUNCTION 7/34/1937  . 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[place])   . Duodenitis   . Edema 05/12/2008  . Esophageal stricture   . GERD (gastroesophageal reflux disease)   . 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 Gynecomastia   . Hepatic steatosis   . HYPERLIPIDEMIA 05/20/2007  . Hypertension   . Hypogonadism male   . Morbid obesity ([place]) 09/10/2009  . OBSTRUCTIVE SLEEP APNEA 11/04/2007  . Personal history of colonic polyps   . RENAL INSUFFICIENCY 05/12/2008  . Sleep apnea    uses cpap  . Urolithiasis     Past Surgical History:  Procedure Laterality Date  . Abdominal US  09/1997  . arm fracture Left 1958   with hardware  . Colon cancer screening    . COLONOSCOPY  08/18/2004   diverticulitis  . COLONOSCOPY  09/24/2009  . ELECTROCARDIOGRAM  02/2006  . FLEXIBLE SIGMOIDOSCOPY  03/04/2001   polyps, anal fissure  . GANGLION CYST EXCISION Left 1980  . Lower Arterial  04/13/2004  . POLYPECTOMY    . Rest Cardiolite  03/19/2003    There were no vitals filed for this visit.      Subjective Assessment - 12/05/16 1027    Subjective Pt reporting  hurting all over which is normal. Pt did report waking up with a tooth ache this morning.    Limitations Sitting;Lifting;Standing;House hold activities   Patient Stated Goals be able to do more and have less pain   Currently in Pain? Yes   Pain Score 2    Pain Location Back   Pain Orientation Lower   Pain Descriptors / Indicators Aching   Pain Type Chronic pain   Pain Onset More than a month ago   Pain Frequency Constant   Aggravating Factors  getting up after sitting for a long period   Pain Relieving Factors exercises   Effect of Pain on Daily Activities difficulty with yard work, household chores                         [place] [facility] Adult PT Treatment/Exercise - 12/05/16 0001      Lumbar Exercises: Aerobic   Stationary Bike 6 minutes   UBE (Upper Arm Bike) constant work 25 watts 4 minutes  2 minutes forward and 2 minutes back.      Lumbar Exercises: Machines for Strengthening   Cybex Lumbar Extension 35# standing 2 sets 10   Cybex Knee Extension 15# 2 sets 15   Cybex Knee Flexion 35# 2 x15  Other Lumbar Machine Exercise seated row 40#, lats 35# 2x15, chest press 10#   Other Lumbar Machine Exercise straight arm pull downs with cues to tighten abs 45lb 2x15, 35# triceps, 15# biceps     Lumbar Exercises: Standing   Other Standing Lumbar Exercises standing while reaching foward 5-6 inches x 10 reps.      Moist Heat Therapy   Number Minutes Moist Heat 10 Minutes   Moist Heat Location Lumbar Spine                PT Education - 12/05/16 1032    Education provided Yes   Education Details Instructed pt in taking rest breaks with daily activities   Person(s) Educated Patient   Methods Explanation   Comprehension Verbalized understanding          PT Short Term Goals - 12/05/16 1038      PT SHORT TERM GOAL #1   Title independent with initial HEP   Time 2   Period Weeks   Status Achieved           PT Long Term Goals - 12/05/16 1038      PT  LONG TERM GOAL #1   Title decrease pain 50%   Time 8   Period Weeks   Status Partially Met     PT LONG TERM GOAL #2   Title tolerate standing >5 minutes wihtout pain >4/10   Baseline pt verb 20 min   Time 8   Period Weeks   Status Achieved     PT LONG TERM GOAL #3   Title increase lumbar ROM 25%   Time 8   Period Weeks   Status Achieved     PT LONG TERM GOAL #4   Title understand proper posture and body mechanics   Time 8   Period Weeks   Status Achieved               Plan - 12/05/16 1035    Clinical Impression Statement Pt still tolerating treatments well. Pt progressing toward LTG's and increased activity tolerance. Pt still reporting difficulty when leaning forward when washing disches. Pt making progress with therapy in strength and endurance. Pt reported increased low back pain with reaching forward 5-6 inches while standing. Skilled PT needed to continue to progress pt's core strength, LE strength and functional endurance.    Rehab Potential Good   PT Frequency 2x / week   PT Treatment/Interventions ADLs/Self Care Home Management;Electrical Stimulation;Moist Heat;Traction;Ultrasound;Patient/family education;Therapeutic exercise;Therapeutic activities;Manual techniques   PT Next Visit Plan MD 2/14, next visit write note- prepare for D/C   Consulted and Agree with Plan of Care Patient      Patient will benefit from skilled therapeutic intervention in order to improve the following deficits and impairments:  Decreased activity tolerance, Decreased mobility, Decreased strength, Decreased range of motion, Difficulty walking, Impaired flexibility, Increased muscle spasms, Improper body mechanics  Visit Diagnosis: Chronic midline low back pain with left-sided sciatica  Muscle spasm of back  Cervicalgia     Problem List Patient Active Problem List   Diagnosis Date Noted  . Unilateral primary osteoarthritis, right knee 11/17/2016  . Complex tear of lateral  meniscus of right knee as current injury 10/26/2016  . Radiculitis, lumbosacral 06/30/2016  . Diabetes ([place]) 01/01/2016  . Solitary pulmonary nodule 12/30/2015  . Urolithiasis 02/23/2015  . Hearing loss 11/25/2014  . Paresthesia 09/07/2014  . Routine general medical examination at a health care facility 07/07/2014  . Disturbance of  skin sensation 12/26/2013  . UTI (urinary tract infection) 03/12/2013  . Screening for prostate cancer 02/14/2012  . Right knee pain 11/15/2011  . Encounter for long-term (current) use of other medications 01/17/2011  . Special screening examination for neoplasm of prostate 01/17/2011  . MYCOSIS FUNGOIDES 12/29/2009  . HEARING LOSS 09/30/2009  . ECZEMA 09/30/2009  . VITAMIN B12 DEFICIENCY 09/10/2009  . MORBID OBESITY 09/10/2009  . SYNCOPE 05/03/2009  . DIASTOLIC DYSFUNCTION 38/25/0539  . CARDIAC MURMUR, SYSTOLIC 76/73/4193  . Pancytopenia ([place]) 05/12/2008  . Disorder resulting from impaired renal function 05/12/2008  . EDEMA 05/12/2008  . Obstructive sleep apnea 11/04/2007  . BACK PAIN, LUMBAR 10/23/2007  . Dyslipidemia 05/20/2007  . 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 Essential hypertension 05/20/2007    Oretha Caprice, MPT 12/05/2016, 11:05 AM  [address] [place], [place], [address] Phone: [phone]   Fax:  [phone]  Name: [name] MRN: [id number] Date of Birth: 1940/11/25

## 2016-12-05 NOTE — Therapy (Deleted)
[place], [place], [address] Phone: [phone]   Fax:  [phone]  Physical Therapy Treatment  Patient Details  Name: [name] MRN: [id number] Date of Birth: 1941/01/05 Referring Provider: Erlinda Hong  Encounter Date: 12/05/2016      PT End of Session - 12/05/16 1034    Visit Number 16   Date for PT Re-Evaluation 12/24/16   PT Start Time 1020   PT Stop Time 1100   PT Time Calculation (min) 40 min   Activity Tolerance Patient tolerated treatment well   Behavior During Therapy [facility] for tasks assessed/performed      Past Medical History:  Diagnosis Date  . Allergy   . Anal fissure   . Anemia, unspecified   . Arthritis    Spinal OA  . BACK PAIN, LUMBAR 10/23/2007  . CARDIAC MURMUR, SYSTOLIC 10/31/6220  . DIABETES MELLITUS, TYPE I 05/20/2007  . DIASTOLIC DYSFUNCTION 9/79/8921  . 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[place])   . Duodenitis   . Edema 05/12/2008  . Esophageal stricture   . GERD (gastroesophageal reflux disease)   . 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 Gynecomastia   . Hepatic steatosis   . HYPERLIPIDEMIA 05/20/2007  . Hypertension   . Hypogonadism male   . Morbid obesity ([place]) 09/10/2009  . OBSTRUCTIVE SLEEP APNEA 11/04/2007  . Personal history of colonic polyps   . RENAL INSUFFICIENCY 05/12/2008  . Sleep apnea    uses cpap  . Urolithiasis     Past Surgical History:  Procedure Laterality Date  . Abdominal US  09/1997  . arm fracture Left 1958   with hardware  . Colon cancer screening    . COLONOSCOPY  08/18/2004   diverticulitis  . COLONOSCOPY  09/24/2009  . ELECTROCARDIOGRAM  02/2006  . FLEXIBLE SIGMOIDOSCOPY  03/04/2001   polyps, anal fissure  . GANGLION CYST EXCISION Left 1980  . Lower Arterial  04/13/2004  . POLYPECTOMY    . Rest Cardiolite  03/19/2003    There were no vitals filed for this visit.      Subjective Assessment - 12/05/16 1027    Subjective Pt reporting  hurting all over which is normal. Pt did report waking up with a tooth ache this morning.    Limitations Sitting;Lifting;Standing;House hold activities   Patient Stated Goals be able to do more and have less pain   Currently in Pain? Yes   Pain Score 2    Pain Location Back   Pain Orientation Lower   Pain Descriptors / Indicators Aching   Pain Type Chronic pain   Pain Onset More than a month ago   Pain Frequency Constant   Aggravating Factors  getting up after sitting for a long period   Pain Relieving Factors exercises   Effect of Pain on Daily Activities difficulty with yard work, household chores                                 PT Education - 12/05/16 1032    Education provided Yes   Education Details Instructed pt in taking rest breaks with daily activities   Person(s) Educated Patient   Methods Explanation   Comprehension Verbalized understanding          PT Short Term Goals - 12/05/16 1038      PT SHORT TERM GOAL #1   Title independent with initial HEP  Time 2   Period Weeks   Status Achieved           PT Long Term Goals - 12/05/16 1038      PT LONG TERM GOAL #1   Title decrease pain 50%   Time 8   Period Weeks   Status Partially Met     PT LONG TERM GOAL #2   Title tolerate standing >5 minutes wihtout pain >4/10   Baseline pt verb 20 min   Time 8   Period Weeks   Status Achieved     PT LONG TERM GOAL #3   Title increase lumbar ROM 25%   Time 8   Period Weeks   Status Achieved     PT LONG TERM GOAL #4   Title understand proper posture and body mechanics   Time 8   Period Weeks   Status Achieved               Plan - 12/05/16 1035    Clinical Impression Statement Pt still tolerating treatments well. Pt progressing toward LTG's and increased activity tolerance. Pt still reporting difficulty when leaning forward when washing disches. Pt making progress with therapy in strength and endurance. Skilled PT needed to  continue to progress pt's core strength, LE strength and functional endurance.    Rehab Potential Good   PT Frequency 2x / week   PT Treatment/Interventions ADLs/Self Care Home Management;Electrical Stimulation;Moist Heat;Traction;Ultrasound;Patient/family education;Therapeutic exercise;Therapeutic activities;Manual techniques   PT Next Visit Plan MD 2/14, next visit write note- prepare for D/C   Consulted and Agree with Plan of Care Patient      Patient will benefit from skilled therapeutic intervention in order to improve the following deficits and impairments:  Decreased activity tolerance, Decreased mobility, Decreased strength, Decreased range of motion, Difficulty walking, Impaired flexibility, Increased muscle spasms, Improper body mechanics  Visit Diagnosis: Chronic midline low back pain with left-sided sciatica  Muscle spasm of back  Cervicalgia     Problem List Patient Active Problem List   Diagnosis Date Noted  . Unilateral primary osteoarthritis, right knee 11/17/2016  . Complex tear of lateral meniscus of right knee as current injury 10/26/2016  . Radiculitis, lumbosacral 06/30/2016  . Diabetes ([place]) 01/01/2016  . Solitary pulmonary nodule 12/30/2015  . Urolithiasis 02/23/2015  . Hearing loss 11/25/2014  . Paresthesia 09/07/2014  . Routine general medical examination at a health care facility 07/07/2014  . Disturbance of skin sensation 12/26/2013  . UTI (urinary tract infection) 03/12/2013  . Screening for prostate cancer 02/14/2012  . Right knee pain 11/15/2011  . Encounter for long-term (current) use of other medications 01/17/2011  . Special screening examination for neoplasm of prostate 01/17/2011  . MYCOSIS FUNGOIDES 12/29/2009  . HEARING LOSS 09/30/2009  . ECZEMA 09/30/2009  . VITAMIN B12 DEFICIENCY 09/10/2009  . MORBID OBESITY 09/10/2009  . SYNCOPE 05/03/2009  . DIASTOLIC DYSFUNCTION 36/46/8032  . CARDIAC MURMUR, SYSTOLIC 10/15/8249  . Pancytopenia  ([place]) 05/12/2008  . Disorder resulting from impaired renal function 05/12/2008  . EDEMA 05/12/2008  . Obstructive sleep apnea 11/04/2007  . BACK PAIN, LUMBAR 10/23/2007  . Dyslipidemia 05/20/2007  . 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 Essential hypertension 05/20/2007    Oretha Caprice, MPT  12/05/2016, 10:41 AM  [address] [address] [place], [place], [address] Phone: [phone]   Fax:  [phone]  Name: [name] MRN: [id number] Date of Birth: 1941/09/06

## 2016-12-06 ENCOUNTER — Ambulatory Visit: Payer: Medicare Other | Admitting: Physical Therapy

## 2016-12-07 ENCOUNTER — Encounter: Payer: Self-pay | Admitting: Physical Medicine & Rehabilitation

## 2016-12-07 ENCOUNTER — Encounter: Payer: Medicare Other | Attending: Physical Medicine & Rehabilitation | Admitting: Physical Medicine & Rehabilitation

## 2016-12-07 VITALS — BP 128/74 | HR 81 | Resp 14

## 2016-12-07 DIAGNOSIS — M1990 Unspecified osteoarthritis, unspecified site: Secondary | ICD-10-CM | POA: Insufficient documentation

## 2016-12-07 DIAGNOSIS — I129 Hypertensive chronic kidney disease with stage 1 through stage 4 chronic kidney disease, or unspecified chronic kidney disease: Secondary | ICD-10-CM | POA: Diagnosis not present

## 2016-12-07 DIAGNOSIS — Z87891 Personal history of nicotine dependence: Secondary | ICD-10-CM | POA: Insufficient documentation

## 2016-12-07 DIAGNOSIS — E1022 Type 1 diabetes mellitus with diabetic chronic kidney disease: Secondary | ICD-10-CM | POA: Insufficient documentation

## 2016-12-07 DIAGNOSIS — G479 Sleep disorder, unspecified: Secondary | ICD-10-CM | POA: Diagnosis not present

## 2016-12-07 DIAGNOSIS — G8929 Other chronic pain: Secondary | ICD-10-CM

## 2016-12-07 DIAGNOSIS — G4733 Obstructive sleep apnea (adult) (pediatric): Secondary | ICD-10-CM | POA: Diagnosis not present

## 2016-12-07 DIAGNOSIS — E1042 Type 1 diabetes mellitus with diabetic polyneuropathy: Secondary | ICD-10-CM | POA: Diagnosis not present

## 2016-12-07 DIAGNOSIS — E1142 Type 2 diabetes mellitus with diabetic polyneuropathy: Secondary | ICD-10-CM

## 2016-12-07 DIAGNOSIS — M791 Myalgia, unspecified site: Secondary | ICD-10-CM

## 2016-12-07 DIAGNOSIS — N189 Chronic kidney disease, unspecified: Secondary | ICD-10-CM | POA: Insufficient documentation

## 2016-12-07 DIAGNOSIS — M109 Gout, unspecified: Secondary | ICD-10-CM | POA: Diagnosis not present

## 2016-12-07 DIAGNOSIS — E785 Hyperlipidemia, unspecified: Secondary | ICD-10-CM | POA: Diagnosis not present

## 2016-12-07 DIAGNOSIS — M542 Cervicalgia: Secondary | ICD-10-CM | POA: Diagnosis not present

## 2016-12-07 DIAGNOSIS — K219 Gastro-esophageal reflux disease without esophagitis: Secondary | ICD-10-CM | POA: Insufficient documentation

## 2016-12-07 DIAGNOSIS — M461 Sacroiliitis, not elsewhere classified: Secondary | ICD-10-CM

## 2016-12-07 DIAGNOSIS — M545 Low back pain: Secondary | ICD-10-CM | POA: Diagnosis not present

## 2016-12-07 MED ORDER — DULOXETINE HCL 60 MG PO CPEP: 60.0000 mg | ORAL_CAPSULE | Freq: Every day | ORAL | 1 refills | Status: DC

## 2016-12-07 MED ORDER — GABAPENTIN 300 MG PO CAPS: 300.0000 mg | ORAL_CAPSULE | Freq: Three times a day (TID) | ORAL | 1 refills | Status: DC

## 2016-12-07 NOTE — Progress Notes (Signed)
Subjective:    Patient ID: [name], male    DOB: 11-26-1940, 76 y.o.   MRN: [id number]  HPI  76 y/o male with pmh of OSA, CKD, HTN, DM type 1 with peripheral neuropathy presents for follow up of low back pain > neck pain.  On first visit, pt complained of b/l L>R back pain.  Started ~>20 years ago.  No inciting event.  Sitting/laying improve the pain.  Standing exacerbates the pain.  Sharp, burning pain.  Radiates to left posterior thigh.  Intermittent.  He has associated numbness/tingling.  He only tried ASA, which helps some.  Pain limits from doing ADLs and fishing.  He denies falls.  Last clinic visit  10/04/16.  At that time PT was ordered, and he does not note much improvement. Although, he does not improvement in LE strength.  He had knee surgery in his left knee.  TENS unit help.  He has not tried pool therapy. He notes there have been some deaths in his family recently and he has limited time.   Robaxin helps, but has seldom taken it.  Cymbalta helps.  Gabapentin helps.  Overall, pt states he is doing better and has improvement in strength.   Pain Inventory Average Pain 2 Pain Right Now 1 My pain is constant and sharp  In the last 24 hours, has pain interfered with the following? General activity 0 Relation with others 0 Enjoyment of life 0 What TIME of day is your pain at its worst? morning Sleep (in general) Good  Pain is worse with: walking, bending and standing Pain improves with: rest and heat/ice Relief from Meds: no selection  Mobility walk without assistance how many minutes can you walk? 15-30 ability to climb steps?  yes do you drive?  yes  Function retired  Neuro/Psych weakness tingling  Prior Studies Any changes since last visit?  no x-rays CT/MRI nerve study  Physicians involved in your care Any changes since last visit?  no   Family History  Problem Relation Age of Onset  . Colon cancer Brother 19  . Colon polyps Brother   . Cancer  Brother     lung cancer, deceased  . Other Mother     MVA, deceased 74s  . Healthy Daughter   . Healthy Son   . Rectal cancer Neg Hx   . Stomach cancer Neg Hx    Social History   Social History  . Marital status: Married    Spouse name: N/A  . Number of children: N/A  . Years of education: N/A   Occupational History  .  [employer]   Social History Main Topics  . Smoking status: Former Smoker    Packs/day: 2.00    Years: 31.00    Types: Cigarettes    Quit date: 10/23/1982  . Smokeless tobacco: Never Used  . Alcohol use No  . Drug use: No  . Sexual activity: Not Asked   Other Topics Concern  . None   Social History Narrative   Lives with wife in a one story home.  Has a son and a daughter.     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     Education: 2 years of college.      Past Surgical History:  Procedure Laterality Date  . Abdominal US  09/1997  . arm fracture Left 1958   with hardware  . Colon cancer screening    . COLONOSCOPY  08/18/2004   diverticulitis  .  COLONOSCOPY  09/24/2009  . ELECTROCARDIOGRAM  02/2006  . FLEXIBLE SIGMOIDOSCOPY  03/04/2001   polyps, anal fissure  . GANGLION CYST EXCISION Left 1980  . Lower Arterial  04/13/2004  . POLYPECTOMY    . Rest Cardiolite  03/19/2003   Past Medical History:  Diagnosis Date  . Allergy   . Anal fissure   . Anemia, unspecified   . Arthritis    Spinal OA  . BACK PAIN, LUMBAR 10/23/2007  . CARDIAC MURMUR, SYSTOLIC 11/30/3660  . DIABETES MELLITUS, TYPE I 05/20/2007  . DIASTOLIC DYSFUNCTION 9/47/6546  . 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[place])   . Duodenitis   . Edema 05/12/2008  . Esophageal stricture   . GERD (gastroesophageal reflux disease)   . 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 Gynecomastia   . Hepatic steatosis   . HYPERLIPIDEMIA 05/20/2007  . Hypertension   . Hypogonadism male   . Morbid obesity ([place]) 09/10/2009  . OBSTRUCTIVE SLEEP APNEA 11/04/2007  . Personal history of colonic  polyps   . RENAL INSUFFICIENCY 05/12/2008  . Sleep apnea    uses cpap  . Urolithiasis    BP 128/74   Pulse 81   Resp 14   SpO2 95%   Opioid Risk Score:   Fall Risk Score:  `1  Depression screen PHQ 2/9  Depression screen PHQ 2/9 08/31/2016  Decreased Interest 0  Down, Depressed, Hopeless 0  PHQ - 2 Score 0  Altered sleeping 0  Tired, decreased energy 1  Change in appetite 0  Feeling bad or failure about yourself  0  Trouble concentrating 0  Moving slowly or fidgety/restless 0  Suicidal thoughts 0  PHQ-9 Score 1  Difficult doing work/chores Somewhat difficult    Review of Systems  Constitutional: Positive for diaphoresis.  HENT: Negative.   Eyes: Negative.   Respiratory: Negative.   Cardiovascular: Negative.   Gastrointestinal: Negative.   Endocrine:       Diabetic High blood sugar  Genitourinary: Positive for dysuria.       Retention   Musculoskeletal: Positive for arthralgias, back pain, myalgias and neck pain.  Skin: Negative.   Allergic/Immunologic: Negative.   Neurological:       Tingling  Hematological: Negative.   Psychiatric/Behavioral: Negative.   All other systems reviewed and are negative.     Objective:   Physical Exam Gen: NAD. Vital signs reviewed HENT: Normocephalic, Atraumatic Eyes: EOMI. No discharge.  Cardio: RRR. No JVD. Pulm: B/l clear to auscultation.  Effort normal Abd: Soft, BS+ MSK:  Gait WNL.   TTP mildly along lumbar PSPs  No edema.   Neg lumbar grind test  ROM WNL Neuro:   Sensation diminished to light touch b/l LE >feet. Strength  5/5 in all LE myotomes Skin: Warm and Dry. Intact.     Assessment & Plan:  76 y/o male with pmh of OSA, CKD, HTN, DM type 1 with peripheral neuropathy presents for follow up of low back pain > neck pain.    1. Chronic mechanical low back pain  MRIs C,L-spine early 2016 suggesting multilevel spondylosis with mild b/l multilevel foraminal narrowing C4-C7 and shallow disc bulge at L5-S1 without  central canal or foraminal stenosis.  Avoid NSAIDs due to CKD  Cont PT. Cont HEP  Will order TENs unit as pt has had good benefit  Encouraged aquatic therapy, again  Cont tylenol  Cont Robaxin 500 TID PRN  Will increase Cymbalta 60mg   Will increase Gabapentin 300qhs  Will order back bracing, only  to be used during excessive activity  2. Sacroiliitis  Will order joint belt  Will consider injections in future  3. Sleep disturbance  Pt recently with new mattress  Cont CPAP  4. Myalgia  Will consider trigger point injection to sacral PSPs in future, doing well at present  5. Diabetic neuropathy  See #1  6. Morbid obesity  Pt not interested in seeing dietitian at this time

## 2016-12-18 ENCOUNTER — Ambulatory Visit: Payer: Medicare Other | Admitting: Physical Therapy

## 2016-12-18 ENCOUNTER — Encounter: Payer: Self-pay | Admitting: Physical Therapy

## 2016-12-18 DIAGNOSIS — M62830 Muscle spasm of back: Secondary | ICD-10-CM

## 2016-12-18 DIAGNOSIS — M542 Cervicalgia: Secondary | ICD-10-CM

## 2016-12-18 DIAGNOSIS — G8929 Other chronic pain: Secondary | ICD-10-CM

## 2016-12-18 DIAGNOSIS — M5442 Lumbago with sciatica, left side: Secondary | ICD-10-CM

## 2016-12-18 NOTE — Therapy (Signed)
[name], [place], [address] Phone: [phone]   Fax:  [phone]  Physical Therapy Treatment  Patient Details  Name: [name] MRN: [id number] Date of Birth: 1941/09/03 Referring Provider: Erlinda Hong  Encounter Date: 12/18/2016      PT End of Session - 12/18/16 1214    Visit Number 17   Date for PT Re-Evaluation 12/24/16   PT Start Time 1130   PT Stop Time 1228   PT Time Calculation (min) 58 min   Activity Tolerance Patient tolerated treatment well   Behavior During Therapy [facility] for tasks assessed/performed      Past Medical History:  Diagnosis Date  . Allergy   . Anal fissure   . Anemia, unspecified   . Arthritis    Spinal OA  . BACK PAIN, LUMBAR 10/23/2007  . CARDIAC MURMUR, SYSTOLIC 04/26/9162  . DIABETES MELLITUS, TYPE I 05/20/2007  . DIASTOLIC DYSFUNCTION 8/46/6599  . 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[place])   . Duodenitis   . Edema 05/12/2008  . Esophageal stricture   . GERD (gastroesophageal reflux disease)   . 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 Gynecomastia   . Hepatic steatosis   . HYPERLIPIDEMIA 05/20/2007  . Hypertension   . Hypogonadism male   . Morbid obesity ([place]) 09/10/2009  . OBSTRUCTIVE SLEEP APNEA 11/04/2007  . Personal history of colonic polyps   . RENAL INSUFFICIENCY 05/12/2008  . Sleep apnea    uses cpap  . Urolithiasis     Past Surgical History:  Procedure Laterality Date  . Abdominal US  09/1997  . arm fracture Left 1958   with hardware  . Colon cancer screening    . COLONOSCOPY  08/18/2004   diverticulitis  . COLONOSCOPY  09/24/2009  . ELECTROCARDIOGRAM  02/2006  . FLEXIBLE SIGMOIDOSCOPY  03/04/2001   polyps, anal fissure  . GANGLION CYST EXCISION Left 1980  . Lower Arterial  04/13/2004  . POLYPECTOMY    . Rest Cardiolite  03/19/2003    There were no vitals filed for this visit.      Subjective Assessment - 12/18/16 1132    Subjective "Doing pretty good  except for my lower back"   Currently in Pain? Yes   Pain Score 2    Pain Location --  lower back and shoulders                         [facility] Adult PT Treatment/Exercise - 12/18/16 0001      Lumbar Exercises: Aerobic   Elliptical NuStep Level 6 x 8 minutes   UBE (Upper Arm Bike) L 6 7fd/3rev      Lumbar Exercises: Machines for Strengthening   Cybex Knee Extension 15# 2 sets 15   Cybex Knee Flexion 35# 2 x15   Other Lumbar Machine Exercise seated row 45#, lats 45# 3x10, chest press 10#     Lumbar Exercises: Standing   Other Standing Lumbar Exercises overhead back ext yellow ball 2x10; Bicep curls 35lb 2x15    Other Standing Lumbar Exercises Standing AR press 35lb x10     Moist Heat Therapy   Number Minutes Moist Heat 15 Minutes   Moist Heat Location Lumbar Spine     Electrical Stimulation   Electrical Stimulation Location cervical    Electrical Stimulation Action IFC   Electrical Stimulation Parameters sitting  PT Short Term Goals - 12/05/16 1038      PT SHORT TERM GOAL #1   Title independent with initial HEP   Time 2   Period Weeks   Status Achieved           PT Long Term Goals - 12/18/16 1219      PT LONG TERM GOAL #1   Title decrease pain 50%   Status Partially Met     PT LONG TERM GOAL #2   Title tolerate standing >5 minutes wihtout pain >4/10   Status Achieved     PT LONG TERM GOAL #3   Status Achieved               Plan - 12/18/16 1215    Clinical Impression Statement Pt able to perform all of today's activity but with increase fatigue. Pt with some difficulty with anti rotation core strengthening.  VC's provides to prevent forward leaning during biceps curls. No reports of increase pain.   Rehab Potential Good   PT Frequency 2x / week   PT Duration 4 weeks   PT Treatment/Interventions ADLs/Self Care Home Management;Electrical Stimulation;Moist Heat;Traction;Ultrasound;Patient/family  education;Therapeutic exercise;Therapeutic activities;Manual techniques   PT Next Visit Plan Pt reports that MD wants him to continue PT.      Patient will benefit from skilled therapeutic intervention in order to improve the following deficits and impairments:  Decreased activity tolerance, Decreased mobility, Decreased strength, Decreased range of motion, Difficulty walking, Impaired flexibility, Increased muscle spasms, Improper body mechanics  Visit Diagnosis: Muscle spasm of back  Cervicalgia  Chronic midline low back pain with left-sided sciatica     Problem List Patient Active Problem List   Diagnosis Date Noted  . Unilateral primary osteoarthritis, right knee 11/17/2016  . Complex tear of lateral meniscus of right knee as current injury 10/26/2016  . Radiculitis, lumbosacral 06/30/2016  . Diabetes ([place]) 01/01/2016  . Solitary pulmonary nodule 12/30/2015  . Urolithiasis 02/23/2015  . Hearing loss 11/25/2014  . Paresthesia 09/07/2014  . Routine general medical examination at a health care facility 07/07/2014  . Disturbance of skin sensation 12/26/2013  . UTI (urinary tract infection) 03/12/2013  . Screening for prostate cancer 02/14/2012  . Right knee pain 11/15/2011  . Encounter for long-term (current) use of other medications 01/17/2011  . Special screening examination for neoplasm of prostate 01/17/2011  . MYCOSIS FUNGOIDES 12/29/2009  . HEARING LOSS 09/30/2009  . ECZEMA 09/30/2009  . VITAMIN B12 DEFICIENCY 09/10/2009  . MORBID OBESITY 09/10/2009  . SYNCOPE 05/03/2009  . DIASTOLIC DYSFUNCTION 65/12/5463  . CARDIAC MURMUR, SYSTOLIC 68/09/7516  . Pancytopenia ([place]) 05/12/2008  . Disorder resulting from impaired renal function 05/12/2008  . EDEMA 05/12/2008  . Obstructive sleep apnea 11/04/2007  . BACK PAIN, LUMBAR 10/23/2007  . Dyslipidemia 05/20/2007  . 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 Essential hypertension 05/20/2007    [name], PTA 12/18/2016, 12:19  PM  [place], [place], [address] Phone: [phone]   Fax:  [phone]  Name: [name] MRN: [id number] Date of Birth: 1941-09-27

## 2016-12-20 ENCOUNTER — Encounter: Payer: Self-pay | Admitting: Physical Therapy

## 2016-12-20 ENCOUNTER — Ambulatory Visit: Payer: Medicare Other | Admitting: Physical Therapy

## 2016-12-20 DIAGNOSIS — M542 Cervicalgia: Secondary | ICD-10-CM

## 2016-12-20 DIAGNOSIS — M5442 Lumbago with sciatica, left side: Secondary | ICD-10-CM | POA: Diagnosis not present

## 2016-12-20 DIAGNOSIS — G8929 Other chronic pain: Secondary | ICD-10-CM

## 2016-12-20 DIAGNOSIS — M62830 Muscle spasm of back: Secondary | ICD-10-CM

## 2016-12-20 NOTE — Therapy (Signed)
[place], [place], [address] Phone: [phone]   Fax:  [phone]  Physical Therapy Treatment  Patient Details  Name: [name] MRN: [id number] Date of Birth: [birth year] Referring Provider: Erlinda Hong  Encounter Date: 12/20/2016      PT End of Session - 12/20/16 1426    Visit Number 18   Date for PT Re-Evaluation 12/24/16   PT Start Time 1345   PT Stop Time 1439   PT Time Calculation (min) 54 min   Activity Tolerance Patient tolerated treatment well   Behavior During Therapy [facility] for tasks assessed/performed      Past Medical History:  Diagnosis Date  . Allergy   . Anal fissure   . Anemia, unspecified   . Arthritis    Spinal OA  . BACK PAIN, LUMBAR 10/23/2007  . CARDIAC MURMUR, SYSTOLIC 4/0/0867  . DIABETES MELLITUS, TYPE I 05/20/2007  . DIASTOLIC DYSFUNCTION 04/10/5092  . 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[place])   . Duodenitis   . Edema 05/12/2008  . Esophageal stricture   . GERD (gastroesophageal reflux disease)   . 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 Gynecomastia   . Hepatic steatosis   . HYPERLIPIDEMIA 05/20/2007  . Hypertension   . Hypogonadism male   . Morbid obesity ([place]) 09/10/2009  . OBSTRUCTIVE SLEEP APNEA 11/04/2007  . Personal history of colonic polyps   . RENAL INSUFFICIENCY 05/12/2008  . Sleep apnea    uses cpap  . Urolithiasis     Past Surgical History:  Procedure Laterality Date  . Abdominal US  09/1997  . arm fracture Left 1958   with hardware  . Colon cancer screening    . COLONOSCOPY  08/18/2004   diverticulitis  . COLONOSCOPY  09/24/2009  . ELECTROCARDIOGRAM  02/2006  . FLEXIBLE SIGMOIDOSCOPY  03/04/2001   polyps, anal fissure  . GANGLION CYST EXCISION Left 1980  . Lower Arterial  04/13/2004  . POLYPECTOMY    . Rest Cardiolite  03/19/2003    There were no vitals filed for this visit.      Subjective Assessment - 12/20/16 1347    Subjective "Im doing a lot  better today than I was Monday"   Currently in Pain? Yes   Pain Score 2                          [facility] Adult PT Treatment/Exercise - 12/20/16 0001      Lumbar Exercises: Aerobic   Elliptical NuStep Level 6 x 8 minutes     Lumbar Exercises: Machines for Strengthening   Cybex Knee Extension 15# 2 sets 15   Leg Press 60# 2x10   Other Lumbar Machine Exercise seated row 45# 3x10, lats 45# 2x10,   Other Lumbar Machine Exercise straight arm pull downs with cues to tighten abs 45lb 2x15, 35# triceps, 15# biceps     Lumbar Exercises: Standing   Other Standing Lumbar Exercises Standing AR press 35lb x10     Moist Heat Therapy   Number Minutes Moist Heat 15 Minutes   Moist Heat Location Shoulder;Cervical     Electrical Stimulation   Electrical Stimulation Location cervical    Electrical Stimulation Action IFC   Electrical Stimulation Parameters sitting   Electrical Stimulation Goals Pain                  PT Short Term Goals - 12/05/16 1038  PT SHORT TERM GOAL #1   Title independent with initial HEP   Time 2   Period Weeks   Status Achieved           PT Long Term Goals - 12/20/16 1426      PT LONG TERM GOAL #1   Title decrease pain 50%     PT LONG TERM GOAL #2   Title tolerate standing >5 minutes wihtout pain >4/10   Status Achieved     PT LONG TERM GOAL #3   Title increase lumbar ROM 25%   Status Achieved     PT LONG TERM GOAL #4   Title understand proper posture and body mechanics   Status Achieved               Plan - 12/20/16 1427    Clinical Impression Statement Pt continues to need VC's to correct posture during core activities. Fatigues quick requiring frequent rest breaks.    PT Frequency 2x / week   PT Duration 4 weeks   PT Treatment/Interventions ADLs/Self Care Home Management;Electrical Stimulation;Moist Heat;Traction;Ultrasound;Patient/family education;Therapeutic exercise;Therapeutic activities;Manual techniques    PT Next Visit Plan Pt reports that MD wants him to continue PT.   Consulted and Agree with Plan of Care Patient      Patient will benefit from skilled therapeutic intervention in order to improve the following deficits and impairments:  Decreased activity tolerance, Decreased mobility, Decreased strength, Decreased range of motion, Difficulty walking, Impaired flexibility, Increased muscle spasms, Improper body mechanics  Visit Diagnosis: Muscle spasm of back  Cervicalgia  Chronic midline low back pain with left-sided sciatica     Problem List Patient Active Problem List   Diagnosis Date Noted  . Unilateral primary osteoarthritis, right knee 11/17/2016  . Complex tear of lateral meniscus of right knee as current injury 10/26/2016  . Radiculitis, lumbosacral 06/30/2016  . Diabetes ([place]) 01/01/2016  . Solitary pulmonary nodule 12/30/2015  . Urolithiasis 02/23/2015  . Hearing loss 11/25/2014  . Paresthesia 09/07/2014  . Routine general medical examination at a health care facility 07/07/2014  . Disturbance of skin sensation 12/26/2013  . UTI (urinary tract infection) 03/12/2013  . Screening for prostate cancer 02/14/2012  . Right knee pain 11/15/2011  . Encounter for long-term (current) use of other medications 01/17/2011  . Special screening examination for neoplasm of prostate 01/17/2011  . MYCOSIS FUNGOIDES 12/29/2009  . HEARING LOSS 09/30/2009  . ECZEMA 09/30/2009  . VITAMIN B12 DEFICIENCY 09/10/2009  . MORBID OBESITY 09/10/2009  . SYNCOPE 05/03/2009  . DIASTOLIC DYSFUNCTION 80/32/1224  . CARDIAC MURMUR, SYSTOLIC 82/50/0370  . Pancytopenia ([place]) 05/12/2008  . Disorder resulting from impaired renal function 05/12/2008  . EDEMA 05/12/2008  . Obstructive sleep apnea 11/04/2007  . BACK PAIN, LUMBAR 10/23/2007  . Dyslipidemia 05/20/2007  . 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 Essential hypertension 05/20/2007    [name], PTA 12/20/2016, 2:29 PM  [place], [place], [address] Phone: [phone]   Fax:  [phone]  Name: [name] MRN: [id number] Date of Birth: August 15, 1941

## 2016-12-25 ENCOUNTER — Ambulatory Visit: Payer: Medicare Other | Attending: Physical Medicine & Rehabilitation | Admitting: Physical Therapy

## 2016-12-25 ENCOUNTER — Encounter: Payer: Self-pay | Admitting: Physical Therapy

## 2016-12-25 DIAGNOSIS — M542 Cervicalgia: Secondary | ICD-10-CM | POA: Diagnosis present

## 2016-12-25 DIAGNOSIS — M62830 Muscle spasm of back: Secondary | ICD-10-CM | POA: Diagnosis not present

## 2016-12-25 DIAGNOSIS — G8929 Other chronic pain: Secondary | ICD-10-CM | POA: Diagnosis present

## 2016-12-25 DIAGNOSIS — M5442 Lumbago with sciatica, left side: Secondary | ICD-10-CM | POA: Insufficient documentation

## 2016-12-25 NOTE — Therapy (Signed)
[place], [place], [address] Phone: [phone]   Fax:  [phone]  Physical Therapy Treatment  Patient Details  Name: [name] MRN: [id number] Date of Birth: 03/16/1941 Referring Provider: Erlinda Hong  Encounter Date: 12/25/2016      PT End of Session - 12/25/16 1227    Visit Number 19   Date for PT Re-Evaluation 01/24/17   PT Start Time 1145   PT Stop Time 1241   PT Time Calculation (min) 56 min   Activity Tolerance Patient tolerated treatment well   Behavior During Therapy [facility] for tasks assessed/performed      Past Medical History:  Diagnosis Date  . Allergy   . Anal fissure   . Anemia, unspecified   . Arthritis    Spinal OA  . BACK PAIN, LUMBAR 10/23/2007  . CARDIAC MURMUR, SYSTOLIC 5/0/5397  . DIABETES MELLITUS, TYPE I 05/20/2007  . DIASTOLIC DYSFUNCTION 6/73/4193  . 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[place])   . Duodenitis   . Edema 05/12/2008  . Esophageal stricture   . GERD (gastroesophageal reflux disease)   . 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 Gynecomastia   . Hepatic steatosis   . HYPERLIPIDEMIA 05/20/2007  . Hypertension   . Hypogonadism male   . Morbid obesity ([place]) 09/10/2009  . OBSTRUCTIVE SLEEP APNEA 11/04/2007  . Personal history of colonic polyps   . RENAL INSUFFICIENCY 05/12/2008  . Sleep apnea    uses cpap  . Urolithiasis     Past Surgical History:  Procedure Laterality Date  . Abdominal US  09/1997  . arm fracture Left 1958   with hardware  . Colon cancer screening    . COLONOSCOPY  08/18/2004   diverticulitis  . COLONOSCOPY  09/24/2009  . ELECTROCARDIOGRAM  02/2006  . FLEXIBLE SIGMOIDOSCOPY  03/04/2001   polyps, anal fissure  . GANGLION CYST EXCISION Left 1980  . Lower Arterial  04/13/2004  . POLYPECTOMY    . Rest Cardiolite  03/19/2003    There were no vitals filed for this visit.      Subjective Assessment - 12/25/16 1147    Subjective "My pack is pretty  much ok today I guess"   Currently in Pain? Yes   Pain Score 2    Pain Location Shoulder   Pain Orientation Right;Left                         [facility] Adult PT Treatment/Exercise - 12/25/16 0001      Lumbar Exercises: Aerobic   Elliptical NuStep Level 6 x 8 minutes   UBE (Upper Arm Bike) L 6 63fd/3rev      Lumbar Exercises: Machines for Strengthening   Cybex Knee Extension 15# 2 sets 15   Cybex Knee Flexion 55lb 2x10   Leg Press 60# 2x15   Other Lumbar Machine Exercise seated row 45# 3x10, lats 45# 2x10,   Other Lumbar Machine Exercise straight arm pull downs with cues to tighten abs 45lb 2x15, 35# triceps, 15# biceps     Moist Heat Therapy   Number Minutes Moist Heat 15 Minutes   Moist Heat Location Shoulder;Cervical     Electrical Stimulation   Electrical Stimulation Location cervical    Electrical Stimulation Action IFC   Electrical Stimulation Parameters sitting   Electrical Stimulation Goals Pain                  PT Short Term  Goals - 12/05/16 1038      PT SHORT TERM GOAL #1   Title independent with initial HEP   Time 2   Period Weeks   Status Achieved           PT Long Term Goals - 12/25/16 1230      PT LONG TERM GOAL #1   Title decrease pain 50%   Status Partially Met     PT LONG TERM GOAL #2   Title tolerate standing >5 minutes wihtout pain >4/10   Status Achieved               Plan - 12/25/16 1228    Clinical Impression Statement Pt tolerated all of today's interventions well, without reports of increase pain.    Rehab Potential Good   PT Frequency 2x / week   PT Duration 4 weeks   PT Treatment/Interventions ADLs/Self Care Home Management;Electrical Stimulation;Moist Heat;Traction;Ultrasound;Patient/family education;Therapeutic exercise;Therapeutic activities;Manual techniques      Patient will benefit from skilled therapeutic intervention in order to improve the following deficits and impairments:  Decreased  activity tolerance, Decreased mobility, Decreased strength, Decreased range of motion, Difficulty walking, Impaired flexibility, Increased muscle spasms, Improper body mechanics  Visit Diagnosis: Muscle spasm of back  Cervicalgia  Chronic midline low back pain with left-sided sciatica     Problem List Patient Active Problem List   Diagnosis Date Noted  . Unilateral primary osteoarthritis, right knee 11/17/2016  . Complex tear of lateral meniscus of right knee as current injury 10/26/2016  . Radiculitis, lumbosacral 06/30/2016  . Diabetes ([place]) 01/01/2016  . Solitary pulmonary nodule 12/30/2015  . Urolithiasis 02/23/2015  . Hearing loss 11/25/2014  . Paresthesia 09/07/2014  . Routine general medical examination at a health care facility 07/07/2014  . Disturbance of skin sensation 12/26/2013  . UTI (urinary tract infection) 03/12/2013  . Screening for prostate cancer 02/14/2012  . Right knee pain 11/15/2011  . Encounter for long-term (current) use of other medications 01/17/2011  . Special screening examination for neoplasm of prostate 01/17/2011  . MYCOSIS FUNGOIDES 12/29/2009  . HEARING LOSS 09/30/2009  . ECZEMA 09/30/2009  . VITAMIN B12 DEFICIENCY 09/10/2009  . MORBID OBESITY 09/10/2009  . SYNCOPE 05/03/2009  . DIASTOLIC DYSFUNCTION 09/08/3566  . CARDIAC MURMUR, SYSTOLIC 01/41/0301  . Pancytopenia ([place]) 05/12/2008  . Disorder resulting from impaired renal function 05/12/2008  . EDEMA 05/12/2008  . Obstructive sleep apnea 11/04/2007  . BACK PAIN, LUMBAR 10/23/2007  . Dyslipidemia 05/20/2007  . 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 Essential hypertension 05/20/2007    [name], PTA 12/25/2016, 12:31 PM  [place] [address] [place], [place], [address] Phone: [phone]   Fax:  [phone]  Name: Fuad Forget MRN: [id number] Date of Birth: [birth year]

## 2016-12-25 NOTE — Addendum Note (Signed)
Addended by: Sumner Boast on: 12/25/2016 01:00 PM   Modules accepted: Orders

## 2016-12-27 ENCOUNTER — Encounter: Payer: Self-pay | Admitting: Physical Therapy

## 2016-12-27 ENCOUNTER — Ambulatory Visit: Payer: Medicare Other | Admitting: Physical Therapy

## 2016-12-27 DIAGNOSIS — M5442 Lumbago with sciatica, left side: Secondary | ICD-10-CM

## 2016-12-27 DIAGNOSIS — M62830 Muscle spasm of back: Secondary | ICD-10-CM | POA: Diagnosis not present

## 2016-12-27 DIAGNOSIS — M542 Cervicalgia: Secondary | ICD-10-CM

## 2016-12-27 DIAGNOSIS — G8929 Other chronic pain: Secondary | ICD-10-CM

## 2016-12-27 NOTE — Therapy (Signed)
[place], [place], [address] Phone: [phone]   Fax:  [phone]  Physical Therapy Treatment  Patient Details  Name: [name] MRN: [id number] Date of Birth: 18-Jun-1941 Referring Provider: Erlinda Hong  Encounter Date: 12/27/2016      PT End of Session - 12/27/16 1214    Visit Number 20   Date for PT Re-Evaluation 01/24/17   PT Start Time 1135   PT Stop Time 1230   PT Time Calculation (min) 55 min   Activity Tolerance Patient tolerated treatment well   Behavior During Therapy [facility] for tasks assessed/performed      Past Medical History:  Diagnosis Date  . Allergy   . Anal fissure   . Anemia, unspecified   . Arthritis    Spinal OA  . BACK PAIN, LUMBAR 10/23/2007  . CARDIAC MURMUR, SYSTOLIC 11/29/7410  . DIABETES MELLITUS, TYPE I 05/20/2007  . DIASTOLIC DYSFUNCTION 8/78/6767  . 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[place])   . Duodenitis   . Edema 05/12/2008  . Esophageal stricture   . GERD (gastroesophageal reflux disease)   . 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 Gynecomastia   . Hepatic steatosis   . HYPERLIPIDEMIA 05/20/2007  . Hypertension   . Hypogonadism male   . Morbid obesity ([place]) 09/10/2009  . OBSTRUCTIVE SLEEP APNEA 11/04/2007  . Personal history of colonic polyps   . RENAL INSUFFICIENCY 05/12/2008  . Sleep apnea    uses cpap  . Urolithiasis     Past Surgical History:  Procedure Laterality Date  . Abdominal US  09/1997  . arm fracture Left 1958   with hardware  . Colon cancer screening    . COLONOSCOPY  08/18/2004   diverticulitis  . COLONOSCOPY  09/24/2009  . ELECTROCARDIOGRAM  02/2006  . FLEXIBLE SIGMOIDOSCOPY  03/04/2001   polyps, anal fissure  . GANGLION CYST EXCISION Left 1980  . Lower Arterial  04/13/2004  . POLYPECTOMY    . Rest Cardiolite  03/19/2003    There were no vitals filed for this visit.      Subjective Assessment - 12/27/16 1136    Subjective "My left hip and my  R knee is hurting" Pt reports that his knee is bone on bon and his hip is arthritis   Currently in Pain? Yes   Pain Score 3    Pain Location --  hip and knee, back 2/10                         [facility] Adult PT Treatment/Exercise - 12/27/16 0001      Lumbar Exercises: Aerobic   Elliptical NuStep Level 6 x 8 minutes   UBE (Upper Arm Bike) L 6 86fd/3rev      Lumbar Exercises: Machines for Strengthening   Cybex Knee Extension 15# 2 sets 15   Cybex Knee Flexion 55lb 2x10   Other Lumbar Machine Exercise seated row 45# 2x15, lats 45# 2x15   Other Lumbar Machine Exercise straight arm pull downs with cues to tighten abs 45lb 2x15     Lumbar Exercises: Standing   Other Standing Lumbar Exercises overhead back ext yellow ball 2x10; Bicep curls 35lb 2x15    Other Standing Lumbar Exercises Diagonal trunt rotation syellow ball x10 each;     Moist Heat Therapy   Number Minutes Moist Heat 15 Minutes   Moist Heat Location Shoulder;Cervical     Electrical Stimulation   Electrical  Stimulation Location cervical    Electrical Stimulation Action IFC   Electrical Stimulation Parameters sitting   Electrical Stimulation Goals Pain                  PT Short Term Goals - 12/05/16 1038      PT SHORT TERM GOAL #1   Title independent with initial HEP   Time 2   Period Weeks   Status Achieved           PT Long Term Goals - 12/25/16 1230      PT LONG TERM GOAL #1   Title decrease pain 50%   Status Partially Met     PT LONG TERM GOAL #2   Title tolerate standing >5 minutes wihtout pain >4/10   Status Achieved               Plan - 12/27/16 1215    Clinical Impression Statement Pt enters clinic reporting hip, knee, and the usual back pain. Despite claims pt able to complete all of today's exercises.   Rehab Potential Good   PT Frequency 2x / week   PT Duration 4 weeks   PT Treatment/Interventions ADLs/Self Care Home Management;Electrical Stimulation;Moist  Heat;Traction;Ultrasound;Patient/family education;Therapeutic exercise;Therapeutic activities;Manual techniques   PT Next Visit Plan Pt reports that MD wants him to continue PT. Progress as tolerated      Patient will benefit from skilled therapeutic intervention in order to improve the following deficits and impairments:  Decreased activity tolerance, Decreased mobility, Decreased strength, Decreased range of motion, Difficulty walking, Impaired flexibility, Increased muscle spasms, Improper body mechanics  Visit Diagnosis: Muscle spasm of back  Cervicalgia  Chronic midline low back pain with left-sided sciatica     Problem List Patient Active Problem List   Diagnosis Date Noted  . Unilateral primary osteoarthritis, right knee 11/17/2016  . Complex tear of lateral meniscus of right knee as current injury 10/26/2016  . Radiculitis, lumbosacral 06/30/2016  . Diabetes ([place]) 01/01/2016  . Solitary pulmonary nodule 12/30/2015  . Urolithiasis 02/23/2015  . Hearing loss 11/25/2014  . Paresthesia 09/07/2014  . Routine general medical examination at a health care facility 07/07/2014  . Disturbance of skin sensation 12/26/2013  . UTI (urinary tract infection) 03/12/2013  . Screening for prostate cancer 02/14/2012  . Right knee pain 11/15/2011  . Encounter for long-term (current) use of other medications 01/17/2011  . Special screening examination for neoplasm of prostate 01/17/2011  . MYCOSIS FUNGOIDES 12/29/2009  . HEARING LOSS 09/30/2009  . ECZEMA 09/30/2009  . VITAMIN B12 DEFICIENCY 09/10/2009  . MORBID OBESITY 09/10/2009  . SYNCOPE 05/03/2009  . DIASTOLIC DYSFUNCTION 40/81/4481  . CARDIAC MURMUR, SYSTOLIC 85/63/1497  . Pancytopenia ([place]) 05/12/2008  . Disorder resulting from impaired renal function 05/12/2008  . EDEMA 05/12/2008  . Obstructive sleep apnea 11/04/2007  . BACK PAIN, LUMBAR 10/23/2007  . Dyslipidemia 05/20/2007  . 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 Essential hypertension  05/20/2007    [name], PTA 12/27/2016, 12:16 PM  [place], [place], [address] Phone: [phone]   Fax:  [phone]  Name: Tyheim Vanalstyne MRN: [id number] Date of Birth: Feb 01, 1941

## 2016-12-28 ENCOUNTER — Encounter: Payer: Self-pay | Admitting: Endocrinology

## 2016-12-28 ENCOUNTER — Ambulatory Visit (INDEPENDENT_AMBULATORY_CARE_PROVIDER_SITE_OTHER): Payer: Medicare Other | Admitting: Endocrinology

## 2016-12-28 VITALS — BP 112/64 | HR 88 | Ht 72.0 in | Wt 271.0 lb

## 2016-12-28 DIAGNOSIS — E0800 Diabetes mellitus due to underlying condition with hyperosmolarity without nonketotic hyperglycemic-hyperosmolar coma (NKHHC): Secondary | ICD-10-CM

## 2016-12-28 LAB — POCT GLYCOSYLATED HEMOGLOBIN (HGB A1C): HEMOGLOBIN A1C: 7.7

## 2016-12-28 MED ORDER — INSULIN LISPRO 100 UNIT/ML ~~LOC~~ SOLN: SUBCUTANEOUS | 3 refills | Status: DC

## 2016-12-28 NOTE — Patient Instructions (Addendum)
Please take these settings:   basal rate of 2 units/hr, 24 hrs per day.   mealtime bolus of 1 unit/5 grams carbohydrate.  However, subtract 12 units from your calculated lunch bolus.  continue correction bolus (which some people call "sensitivity," or "insulin sensitivity ratio," or just "isr") of 1 unit for each 10 by which your glucose exceeds 100.   Please come back for a regular physical appointment in 4 months.

## 2016-12-28 NOTE — Progress Notes (Signed)
Subjective:    Patient ID: [name], male    DOB: July 09, 1941, 76 y.o.   MRN: [id number]  HPI Pt returns for f/u of diabetes mellitus: DM type: Insulin-requiring type 2 Dx'ed: 2446 Complications: polyneuropathy and renal insufficiency.  Therapy: insulin since 1995.  DKA: never.  Severe hypoglycemia: never.  Pancreatitis: never.  Other: he started pump therapy in mid-2015; he declines continuous glucose monitor.  Interval history: He takes these settings:  basal rate of 2 units/hr, 24 HRS a day.   mealtime bolus of 1 unit/5 grams carbohydrate, except he subtracts 5-10 units from calculated lunch bolus (not what was prescribed).  correction bolus (which some people call "sensitivity," or "insulin sensitivity ratio," or just "isr") of 1 unit for each 10 by which glucose exceeds 100.  Meter is downloaded, and the printout is scanned into the record.  cbg varies from 88-370.  It is in general lowest in the afternoon, and highest at hs.  He averages approx 110 total units per day.  Past Medical History:  Diagnosis Date  . Allergy   . Anal fissure   . Anemia, unspecified   . Arthritis    Spinal OA  . BACK PAIN, LUMBAR 10/23/2007  . CARDIAC MURMUR, SYSTOLIC 12/01/6379  . DIABETES MELLITUS, TYPE I 05/20/2007  . DIASTOLIC DYSFUNCTION 7/71/1657  . 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[place])   . Duodenitis   . Edema 05/12/2008  . Esophageal stricture   . GERD (gastroesophageal reflux disease)   . 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 Gynecomastia   . Hepatic steatosis   . HYPERLIPIDEMIA 05/20/2007  . Hypertension   . Hypogonadism male   . Morbid obesity ([place]) 09/10/2009  . OBSTRUCTIVE SLEEP APNEA 11/04/2007  . Personal history of colonic polyps   . RENAL INSUFFICIENCY 05/12/2008  . Sleep apnea    uses cpap  . Urolithiasis     Past Surgical History:  Procedure Laterality Date  . Abdominal US  09/1997  . arm fracture Left 1958   with hardware  . Colon cancer screening    . COLONOSCOPY   08/18/2004   diverticulitis  . COLONOSCOPY  09/24/2009  . ELECTROCARDIOGRAM  02/2006  . FLEXIBLE SIGMOIDOSCOPY  03/04/2001   polyps, anal fissure  . GANGLION CYST EXCISION Left 1980  . Lower Arterial  04/13/2004  . POLYPECTOMY    . Rest Cardiolite  03/19/2003    Social History   Social History  . Marital status: Married    Spouse name: N/A  . Number of children: N/A  . Years of education: N/A   Occupational History  .  [employer]   Social History Main Topics  . Smoking status: Former Smoker    Packs/day: 2.00    Years: 31.00    Types: Cigarettes    Quit date: 10/23/1982  . Smokeless tobacco: Never Used  . Alcohol use No  . Drug use: No  . Sexual activity: Not on file   Other Topics Concern  . Not on file   Social History Narrative   Lives with wife in a one story home.  Has a son and a daughter.     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     Education: 2 years of college.       Current Outpatient Prescriptions on File Prior to Visit  Medication Sig Dispense Refill  . ACCU-CHEK AVIVA PLUS test strip USE TO TEST BLOOD SUGAR 6  TIMES PER DAY. 540 each 3  .  ACCU-CHEK FASTCLIX LANCETS MISC Test 4 times daily as  directed 408 each 3  . alfuzosin (UROXATRAL) 10 MG 24 hr tablet Take 1 tablet by mouth  daily with breakfast 90 tablet 2  . allopurinol (ZYLOPRIM) 300 MG tablet TAKE 1 TABLET BY MOUTH  DAILY 90 tablet 3  . aspirin (BAYER LOW STRENGTH) 81 MG EC tablet Take 81 mg by mouth daily.      . Blood Glucose Monitoring Suppl (ACCU-CHEK AVIVA PLUS) w/Device KIT Use as directed 1 kit 0  . carvedilol (COREG) 3.125 MG tablet Take 0.5 tablets (1.5625 mg total) by mouth 2 (two) times daily with a meal. 90 tablet 3  . cycloSPORINE (RESTASIS) 0.05 % ophthalmic emulsion Place 1 drop into both eyes 2 (two) times daily.      . DULoxetine (CYMBALTA) 60 MG capsule Take 1 capsule (60 mg total) by mouth daily. 30 capsule 1  . fluticasone (FLONASE) 50 MCG/ACT nasal  spray Place 1 spray into the nose daily as needed for allergies.     . folic acid (FOLVITE) 1 MG tablet Take 2 mg by mouth 2 (two) times daily. 4 tabs daily    . furosemide (LASIX) 20 MG tablet TAKE 1 TABLET BY MOUTH  DAILY 90 tablet 3  . gabapentin (NEURONTIN) 300 MG capsule Take 1 capsule (300 mg total) by mouth 3 (three) times daily. 90 capsule 1  . Insulin Infusion Pump Supplies (ACCU-CHEK PLASTIC CARTRIDGE) MISC USE AS DIRECTED PER INSULIN PUMP 30 each 3  . Insulin Infusion Pump Supplies (ULTRAFLEX) MISC CHANGE AS DIRECTED 30 each 3  . insulin lispro (HUMALOG) 100 UNIT/ML injection For use in pump, for a total of 80 units per day 80 mL 3  . methocarbamol (ROBAXIN) 500 MG tablet Take 1 tablet (500 mg total) by mouth every 8 (eight) hours as needed for muscle spasms. 90 tablet 1  . mometasone (ELOCON) 0.1 % lotion APPLY TOPICALLY DAILY 60 mL 11  . Multiple Vitamins-Minerals (CENTRUM SILVER PO) Take 1 tablet by mouth daily.      . Omega-3 Fatty Acids (FISH OIL) 1200 MG CAPS Take 1,200 mg by mouth daily.     Marland Kitchen omeprazole (PRILOSEC) 20 MG capsule TAKE 1 CAPSULE BY MOUTH  DAILY AS NEEDED 90 capsule 1  . rosuvastatin (CRESTOR) 40 MG tablet TAKE 1 TABLET BY MOUTH AT  BEDTIME 90 tablet 3  . vitamin B-12 (CYANOCOBALAMIN) 1000 MCG tablet Take 1,000 mcg by mouth daily.     No current facility-administered medications on file prior to visit.     Allergies  Allergen Reactions  . Atorvastatin Other (See Comments)    unknown  . Niacin     REACTION: Severe heartburn  . Pioglitazone     REACTION: Edema    Family History  Problem Relation Age of Onset  . Colon cancer Brother 67  . Colon polyps Brother   . Cancer Brother     lung cancer, deceased  . Other Mother     MVA, deceased 53s  . Healthy Daughter   . Healthy Son   . Rectal cancer Neg Hx   . Stomach cancer Neg Hx     There were no vitals taken for this visit.   Review of Systems He denies hypoglycemia.     Objective:    Physical Exam VITAL SIGNS:  See vs page.  GENERAL: no distress.  Pulses: dorsalis pedis intact bilat.   MSK: no deformity of the feet.  CV: 1+ bilat leg edema.  Skin:  no ulcer on the feet.  normal color and temp on the feet.   Neuro: sensation is intact to touch on the feet, but decreased from normal.    A1c=7.7%    Assessment & Plan:  Insulin-requiring type 2 DM: The pattern of his cbg's indicates he needs some adjustment in his therapy      Patient is advised the following: Patient Instructions  Please take these settings:   basal rate of 2 units/hr, 24 hrs per day.   mealtime bolus of 1 unit/5 grams carbohydrate.  However, subtract 12 units from your calculated lunch bolus.  continue correction bolus (which some people call "sensitivity," or "insulin sensitivity ratio," or just "isr") of 1 unit for each 10 by which your glucose exceeds 100.   Please come back for a regular physical appointment in 4 months.

## 2017-01-02 ENCOUNTER — Ambulatory Visit: Payer: Medicare Other | Admitting: Physical Therapy

## 2017-01-02 ENCOUNTER — Encounter: Payer: Self-pay | Admitting: Physical Therapy

## 2017-01-02 DIAGNOSIS — G8929 Other chronic pain: Secondary | ICD-10-CM

## 2017-01-02 DIAGNOSIS — M5442 Lumbago with sciatica, left side: Secondary | ICD-10-CM

## 2017-01-02 DIAGNOSIS — M62830 Muscle spasm of back: Secondary | ICD-10-CM | POA: Diagnosis not present

## 2017-01-02 DIAGNOSIS — M542 Cervicalgia: Secondary | ICD-10-CM

## 2017-01-02 NOTE — Therapy (Signed)
[place], [place], [address] Phone: [phone]   Fax:  [phone]  Physical Therapy Treatment  Patient Details  Name: [name] MRN: [id number] Date of Birth: [birth year] Referring Provider: Erlinda Hong  Encounter Date: 01/02/2017      PT End of Session - 01/02/17 1134    Visit Number 21   Date for PT Re-Evaluation 01/24/17   PT Start Time 1048   PT Stop Time 1146   PT Time Calculation (min) 58 min   Activity Tolerance Patient tolerated treatment well   Behavior During Therapy [facility] for tasks assessed/performed      Past Medical History:  Diagnosis Date  . Allergy   . Anal fissure   . Anemia, unspecified   . Arthritis    Spinal OA  . BACK PAIN, LUMBAR 10/23/2007  . CARDIAC MURMUR, SYSTOLIC 02/26/8615  . DIABETES MELLITUS, TYPE I 05/20/2007  . DIASTOLIC DYSFUNCTION 8/37/2902  . 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[place])   . Duodenitis   . Edema 05/12/2008  . Esophageal stricture   . GERD (gastroesophageal reflux disease)   . 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 Gynecomastia   . Hepatic steatosis   . HYPERLIPIDEMIA 05/20/2007  . Hypertension   . Hypogonadism male   . Morbid obesity ([place]) 09/10/2009  . OBSTRUCTIVE SLEEP APNEA 11/04/2007  . Personal history of colonic polyps   . RENAL INSUFFICIENCY 05/12/2008  . Sleep apnea    uses cpap  . Urolithiasis     Past Surgical History:  Procedure Laterality Date  . Abdominal US  09/1997  . arm fracture Left 1958   with hardware  . Colon cancer screening    . COLONOSCOPY  08/18/2004   diverticulitis  . COLONOSCOPY  09/24/2009  . ELECTROCARDIOGRAM  02/2006  . FLEXIBLE SIGMOIDOSCOPY  03/04/2001   polyps, anal fissure  . GANGLION CYST EXCISION Left 1980  . Lower Arterial  04/13/2004  . POLYPECTOMY    . Rest Cardiolite  03/19/2003    There were no vitals filed for this visit.      Subjective Assessment - 01/02/17 1051    Subjective My left hip and  right knee has continued to hurt. reports that the exercises here do not increase the pain.  Reports that the MD told him that he needs a knee replacement.     Currently in Pain? Yes   Pain Score 3    Pain Location Hip                         [facility] Adult PT Treatment/Exercise - 01/02/17 0001      Lumbar Exercises: Stretches   Passive Hamstring Stretch 3 reps;20 seconds   Lower Trunk Rotation 5 reps;10 seconds   ITB Stretch 3 reps;20 seconds   Piriformis Stretch 3 reps;20 seconds     Lumbar Exercises: Aerobic   Elliptical NuStep Level 6 x 8 minutes   UBE (Upper Arm Bike) L 6 78fd/3rev      Lumbar Exercises: Machines for Strengthening   Cybex Knee Extension 15# 2 sets 15   Cybex Knee Flexion 55lb 2x10   Leg Press 60# 2x15   Other Lumbar Machine Exercise seated row 45# 2x15, lats 45# 2x15   Other Lumbar Machine Exercise straight arm pull downs with cues to tighten abs 45lb 2x15     Lumbar Exercises: Standing   Other Standing Lumbar Exercises Diagonal trunt rotation yellow ball  x10 each;     Moist Heat Therapy   Number Minutes Moist Heat 15 Minutes   Moist Heat Location Lumbar Spine     Electrical Stimulation   Electrical Stimulation Location lumbar area   Electrical Stimulation Action IFC   Electrical Stimulation Parameters sitting   Electrical Stimulation Goals Pain                  PT Short Term Goals - 12/05/16 1038      PT SHORT TERM GOAL #1   Title independent with initial HEP   Time 2   Period Weeks   Status Achieved           PT Long Term Goals - 01/02/17 1137      PT LONG TERM GOAL #1   Title decrease pain 50%   Status Partially Met     PT LONG TERM GOAL #2   Title tolerate standing >5 minutes wihtout pain >4/10   Status Achieved     PT LONG TERM GOAL #3   Title increase lumbar ROM 25%   Status Achieved     PT LONG TERM GOAL #4   Title understand proper posture and body mechanics   Status Achieved                Plan - 01/02/17 1135    Clinical Impression Statement Patient with c/o mostly pain in the low back and the left lateral hip, he was very tight in the ITB and HS on the left side.  He reports that he is feeling better about trying to replicate this at the gym on his own, but is unsure if he can do it.   PT Next Visit Plan Our plan is to discharge PT next visit and assure that he is safe with going to the gym on his own.  He will be encouraged to stretch   Consulted and Agree with Plan of Care Patient      Patient will benefit from skilled therapeutic intervention in order to improve the following deficits and impairments:  Decreased activity tolerance, Decreased mobility, Decreased strength, Decreased range of motion, Difficulty walking, Impaired flexibility, Increased muscle spasms, Improper body mechanics  Visit Diagnosis: Muscle spasm of back  Cervicalgia  Chronic midline low back pain with left-sided sciatica     Problem List Patient Active Problem List   Diagnosis Date Noted  . Unilateral primary osteoarthritis, right knee 11/17/2016  . Complex tear of lateral meniscus of right knee as current injury 10/26/2016  . Radiculitis, lumbosacral 06/30/2016  . Diabetes ([place]) 01/01/2016  . Solitary pulmonary nodule 12/30/2015  . Urolithiasis 02/23/2015  . Hearing loss 11/25/2014  . Paresthesia 09/07/2014  . Routine general medical examination at a health care facility 07/07/2014  . Disturbance of skin sensation 12/26/2013  . UTI (urinary tract infection) 03/12/2013  . Screening for prostate cancer 02/14/2012  . Right knee pain 11/15/2011  . Encounter for long-term (current) use of other medications 01/17/2011  . Special screening examination for neoplasm of prostate 01/17/2011  . MYCOSIS FUNGOIDES 12/29/2009  . HEARING LOSS 09/30/2009  . ECZEMA 09/30/2009  . VITAMIN B12 DEFICIENCY 09/10/2009  . MORBID OBESITY 09/10/2009  . SYNCOPE 05/03/2009  . DIASTOLIC DYSFUNCTION  30/04/6225  . CARDIAC MURMUR, SYSTOLIC 33/35/4562  . Pancytopenia ([place]) 05/12/2008  . Disorder resulting from impaired renal function 05/12/2008  . EDEMA 05/12/2008  . Obstructive sleep apnea 11/04/2007  . BACK PAIN, LUMBAR 10/23/2007  . Dyslipidemia 05/20/2007  . GOUT  05/20/2007  . Essential hypertension 05/20/2007    ALBRIGHT,MICHAEL W., PT 01/02/2017, 11:38 AM  [facility] [address] [place], [place], [address] Phone: [phone]   Fax:  [phone]  Name: Harshil Cimini MRN: [id number] Date of Birth: 12/10/1940   

## 2017-01-04 ENCOUNTER — Encounter: Payer: Medicare Other | Attending: Physical Medicine & Rehabilitation | Admitting: Physical Medicine & Rehabilitation

## 2017-01-04 ENCOUNTER — Ambulatory Visit: Payer: Medicare Other | Admitting: Physical Therapy

## 2017-01-04 ENCOUNTER — Encounter: Payer: Self-pay | Admitting: Physical Medicine & Rehabilitation

## 2017-01-04 VITALS — BP 127/72 | HR 84 | Resp 14

## 2017-01-04 DIAGNOSIS — M1990 Unspecified osteoarthritis, unspecified site: Secondary | ICD-10-CM | POA: Insufficient documentation

## 2017-01-04 DIAGNOSIS — E785 Hyperlipidemia, unspecified: Secondary | ICD-10-CM | POA: Diagnosis not present

## 2017-01-04 DIAGNOSIS — E1022 Type 1 diabetes mellitus with diabetic chronic kidney disease: Secondary | ICD-10-CM | POA: Diagnosis not present

## 2017-01-04 DIAGNOSIS — M109 Gout, unspecified: Secondary | ICD-10-CM | POA: Insufficient documentation

## 2017-01-04 DIAGNOSIS — M545 Low back pain, unspecified: Secondary | ICD-10-CM

## 2017-01-04 DIAGNOSIS — Z87891 Personal history of nicotine dependence: Secondary | ICD-10-CM | POA: Diagnosis not present

## 2017-01-04 DIAGNOSIS — G479 Sleep disorder, unspecified: Secondary | ICD-10-CM

## 2017-01-04 DIAGNOSIS — M791 Myalgia, unspecified site: Secondary | ICD-10-CM

## 2017-01-04 DIAGNOSIS — I129 Hypertensive chronic kidney disease with stage 1 through stage 4 chronic kidney disease, or unspecified chronic kidney disease: Secondary | ICD-10-CM | POA: Insufficient documentation

## 2017-01-04 DIAGNOSIS — G4733 Obstructive sleep apnea (adult) (pediatric): Secondary | ICD-10-CM | POA: Diagnosis not present

## 2017-01-04 DIAGNOSIS — E1142 Type 2 diabetes mellitus with diabetic polyneuropathy: Secondary | ICD-10-CM | POA: Diagnosis not present

## 2017-01-04 DIAGNOSIS — E1042 Type 1 diabetes mellitus with diabetic polyneuropathy: Secondary | ICD-10-CM | POA: Diagnosis not present

## 2017-01-04 DIAGNOSIS — M542 Cervicalgia: Secondary | ICD-10-CM | POA: Diagnosis not present

## 2017-01-04 DIAGNOSIS — G8929 Other chronic pain: Secondary | ICD-10-CM | POA: Insufficient documentation

## 2017-01-04 DIAGNOSIS — N189 Chronic kidney disease, unspecified: Secondary | ICD-10-CM | POA: Insufficient documentation

## 2017-01-04 DIAGNOSIS — K219 Gastro-esophageal reflux disease without esophagitis: Secondary | ICD-10-CM | POA: Diagnosis not present

## 2017-01-04 DIAGNOSIS — M461 Sacroiliitis, not elsewhere classified: Secondary | ICD-10-CM | POA: Insufficient documentation

## 2017-01-04 MED ORDER — GABAPENTIN 300 MG PO CAPS: 300.0000 mg | ORAL_CAPSULE | Freq: Two times a day (BID) | ORAL | 1 refills | Status: DC

## 2017-01-04 NOTE — Progress Notes (Signed)
Subjective:    Patient ID: [name], male    DOB: 1941-02-21, 76 y.o.   MRN: [id number]  HPI  76 y/o male with pmh of OSA, CKD, HTN, DM type 1 with peripheral neuropathy presents for follow up of low back pain > neck pain.   On first visit, pt complained of b/l L>R back pain.  Started ~>20 years ago.  No inciting event.  Sitting/laying improve the pain.  Standing exacerbates the pain.  Sharp, burning pain.  Radiates to left posterior thigh.  Intermittent.  He has associated numbness/tingling.  He only tried ASA, which helps some.  Pain limits from doing ADLs and fishing.  He denies falls.  Last clinic visit  12/08/15.  He does limited HEP.  He is going to start the [facility] next week.  TENs is helping with his back pain, but he has not obtained it.  He is taking Robaxin ~1/3-4 days.  He taking Cymbalta without issues.  He states the Gabapentin is making him sleepy.  He states he saw an Orthotists and there was discussion for a hard shell.    Pain Inventory Average Pain 2 Pain Right Now 1 My pain is intermittent, burning and tingling  In the last 24 hours, has pain interfered with the following? General activity 2 Relation with others 0 Enjoyment of life 1 What TIME of day is your pain at its worst? morning Sleep (in general) Good  Pain is worse with: walking, bending and some activites Pain improves with: rest and TENS Relief from Meds: 6  Mobility walk without assistance how many minutes can you walk? 15-30 ability to climb steps?  yes do you drive?  yes Do you have any goals in this area?  yes  Function retired Do you have any goals in this area?  no  Neuro/Psych weakness numbness  Prior Studies Any changes since last visit?  no  Physicians involved in your care Any changes since last visit?  no   Family History  Problem Relation Age of Onset  . Colon cancer Brother 78  . Colon polyps Brother   . Cancer Brother     lung cancer, deceased  . Other Mother     MVA,  deceased 64s  . Healthy Daughter   . Healthy Son   . Rectal cancer Neg Hx   . Stomach cancer Neg Hx    Social History   Social History  . Marital status: Married    Spouse name: N/A  . Number of children: N/A  . Years of education: N/A   Occupational History  .  [employer]   Social History Main Topics  . Smoking status: Former Smoker    Packs/day: 2.00    Years: 31.00    Types: Cigarettes    Quit date: 10/23/1982  . Smokeless tobacco: Never Used  . Alcohol use No  . Drug use: No  . Sexual activity: Not Asked   Other Topics Concern  . None   Social History Narrative   Lives with wife in a one story home.  Has a son and a daughter.     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     Education: 2 years of college.      Past Surgical History:  Procedure Laterality Date  . Abdominal US  09/1997  . arm fracture Left 1958   with hardware  . Colon cancer screening    . COLONOSCOPY  08/18/2004   diverticulitis  .  COLONOSCOPY  09/24/2009  . ELECTROCARDIOGRAM  02/2006  . FLEXIBLE SIGMOIDOSCOPY  03/04/2001   polyps, anal fissure  . GANGLION CYST EXCISION Left 1980  . Lower Arterial  04/13/2004  . POLYPECTOMY    . Rest Cardiolite  03/19/2003   Past Medical History:  Diagnosis Date  . Allergy   . Anal fissure   . Anemia, unspecified   . Arthritis    Spinal OA  . BACK PAIN, LUMBAR 10/23/2007  . CARDIAC MURMUR, SYSTOLIC 03/26/8755  . DIABETES MELLITUS, TYPE I 05/20/2007  . DIASTOLIC DYSFUNCTION 4/33/2951  . 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[place])   . Duodenitis   . Edema 05/12/2008  . Esophageal stricture   . GERD (gastroesophageal reflux disease)   . 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 Gynecomastia   . Hepatic steatosis   . HYPERLIPIDEMIA 05/20/2007  . Hypertension   . Hypogonadism male   . Morbid obesity ([place]) 09/10/2009  . OBSTRUCTIVE SLEEP APNEA 11/04/2007  . Personal history of colonic polyps   . RENAL INSUFFICIENCY 05/12/2008  . Sleep apnea     uses cpap  . Urolithiasis    BP 127/72 (BP Location: Left Arm, Patient Position: Sitting, Cuff Size: Large)   Pulse 84   Resp 14   SpO2 94%   Opioid Risk Score:   Fall Risk Score:  `1  Depression screen PHQ 2/9  Depression screen PHQ 2/9 08/31/2016  Decreased Interest 0  Down, Depressed, Hopeless 0  PHQ - 2 Score 0  Altered sleeping 0  Tired, decreased energy 1  Change in appetite 0  Feeling bad or failure about yourself  0  Trouble concentrating 0  Moving slowly or fidgety/restless 0  Suicidal thoughts 0  PHQ-9 Score 1  Difficult doing work/chores Somewhat difficult    Review of Systems  Constitutional: Positive for diaphoresis.  HENT: Negative.   Eyes: Negative.   Respiratory: Negative.   Cardiovascular: Negative.   Gastrointestinal: Negative.   Endocrine:       Diabetic High blood sugar  Genitourinary: Positive for difficulty urinating and dysuria.       Retention   Musculoskeletal: Positive for arthralgias, back pain, myalgias and neck pain.  Skin: Negative.   Allergic/Immunologic: Negative.   Neurological: Positive for weakness and numbness.       Tingling  Hematological: Negative.   Psychiatric/Behavioral: Negative.   All other systems reviewed and are negative.     Objective:   Physical Exam Gen: NAD. Vital signs reviewed HENT: Normocephalic, Atraumatic Eyes: EOMI. No discharge.  Cardio: RRR. No JVD. Pulm: B/l clear to auscultation.  Effort normal Abd: Soft, BS+ MSK:  Gait WNL.   +TTP mildly along lumbar PSPs  No edema.   Neg lumbar grind test  ROM WNL Neuro:   Sensation diminished to light touch b/l LE >feet.   Strength  5/5 in all LE myotomes Skin: Warm and Dry. Intact.     Assessment & Plan:  76 y/o male with pmh of OSA, CKD, HTN, DM type 1 with peripheral neuropathy presents for follow up of low back pain > neck pain.    1. Chronic mechanical low back pain  MRIs C,L-spine early 2016 suggesting multilevel spondylosis with mild b/l  multilevel foraminal narrowing C4-C7 and shallow disc bulge at L5-S1 without central canal or foraminal stenosis.  Avoid NSAIDs due to CKD  Cont HEP  TENs unit as pt has had good benefit, pt need to pick it up  Encouraged aquatic therapy, again, pt state he  is to start next week  Cont tylenol  Cont Robaxin 500 TID PRN  Cont Cymbalta 60mg   Will decrease Gabapentin to 300 to BID due to lethargy  Pt to obtain back bracing, only to be used during excessive activity.  He does not need TLSO  Will consider Voltaren gel in future  2. Sacroiliitis  Pt to obtain joint belt  Will consider injections in future  3. Sleep disturbance  Pt recently with new mattress  Cont CPAP  4. Myalgia  Will consider trigger point injection to sacral PSPs in future, doing fairly well at present  5. Diabetic neuropathy  See #1  6. Morbid obesity  Pt not interested in seeing dietitian at this time

## 2017-01-15 ENCOUNTER — Other Ambulatory Visit: Payer: Self-pay | Admitting: Endocrinology

## 2017-01-19 ENCOUNTER — Encounter: Payer: Self-pay | Admitting: Physical Medicine & Rehabilitation

## 2017-01-24 ENCOUNTER — Encounter: Payer: Self-pay | Admitting: Physical Medicine & Rehabilitation

## 2017-02-12 ENCOUNTER — Encounter: Payer: Self-pay | Admitting: Endocrinology

## 2017-02-12 ENCOUNTER — Other Ambulatory Visit: Payer: Self-pay

## 2017-02-12 ENCOUNTER — Other Ambulatory Visit: Payer: Self-pay | Admitting: Physical Medicine & Rehabilitation

## 2017-02-12 MED ORDER — ALFUZOSIN HCL ER 10 MG PO TB24: 10.0000 mg | ORAL_TABLET | Freq: Every day | ORAL | 2 refills | Status: DC

## 2017-02-15 ENCOUNTER — Encounter: Payer: Self-pay | Admitting: Physical Medicine & Rehabilitation

## 2017-02-15 ENCOUNTER — Encounter: Payer: Medicare Other | Attending: Physical Medicine & Rehabilitation | Admitting: Physical Medicine & Rehabilitation

## 2017-02-15 VITALS — BP 133/78 | HR 78 | Resp 14

## 2017-02-15 DIAGNOSIS — E785 Hyperlipidemia, unspecified: Secondary | ICD-10-CM | POA: Diagnosis not present

## 2017-02-15 DIAGNOSIS — Z87891 Personal history of nicotine dependence: Secondary | ICD-10-CM | POA: Insufficient documentation

## 2017-02-15 DIAGNOSIS — E1042 Type 1 diabetes mellitus with diabetic polyneuropathy: Secondary | ICD-10-CM | POA: Diagnosis not present

## 2017-02-15 DIAGNOSIS — M791 Myalgia, unspecified site: Secondary | ICD-10-CM

## 2017-02-15 DIAGNOSIS — G479 Sleep disorder, unspecified: Secondary | ICD-10-CM

## 2017-02-15 DIAGNOSIS — I129 Hypertensive chronic kidney disease with stage 1 through stage 4 chronic kidney disease, or unspecified chronic kidney disease: Secondary | ICD-10-CM | POA: Insufficient documentation

## 2017-02-15 DIAGNOSIS — E1142 Type 2 diabetes mellitus with diabetic polyneuropathy: Secondary | ICD-10-CM | POA: Diagnosis not present

## 2017-02-15 DIAGNOSIS — G8929 Other chronic pain: Secondary | ICD-10-CM | POA: Diagnosis not present

## 2017-02-15 DIAGNOSIS — M542 Cervicalgia: Secondary | ICD-10-CM | POA: Diagnosis not present

## 2017-02-15 DIAGNOSIS — M1990 Unspecified osteoarthritis, unspecified site: Secondary | ICD-10-CM | POA: Diagnosis not present

## 2017-02-15 DIAGNOSIS — G4733 Obstructive sleep apnea (adult) (pediatric): Secondary | ICD-10-CM | POA: Diagnosis not present

## 2017-02-15 DIAGNOSIS — N189 Chronic kidney disease, unspecified: Secondary | ICD-10-CM | POA: Insufficient documentation

## 2017-02-15 DIAGNOSIS — M545 Low back pain, unspecified: Secondary | ICD-10-CM

## 2017-02-15 DIAGNOSIS — M461 Sacroiliitis, not elsewhere classified: Secondary | ICD-10-CM | POA: Diagnosis not present

## 2017-02-15 DIAGNOSIS — M109 Gout, unspecified: Secondary | ICD-10-CM | POA: Insufficient documentation

## 2017-02-15 DIAGNOSIS — E1022 Type 1 diabetes mellitus with diabetic chronic kidney disease: Secondary | ICD-10-CM | POA: Insufficient documentation

## 2017-02-15 DIAGNOSIS — K219 Gastro-esophageal reflux disease without esophagitis: Secondary | ICD-10-CM | POA: Insufficient documentation

## 2017-02-15 MED ORDER — DICLOFENAC SODIUM 1 % TD GEL: 2.0000 g | Freq: Four times a day (QID) | TRANSDERMAL | 1 refills | Status: DC

## 2017-02-15 MED ORDER — PREGABALIN 50 MG PO CAPS: 50.0000 mg | ORAL_CAPSULE | Freq: Three times a day (TID) | ORAL | 1 refills | Status: DC

## 2017-02-15 NOTE — Progress Notes (Signed)
Subjective:    Patient ID: [name], male    DOB: Mar 09, 1941, 76 y.o.   MRN: [id number]  HPI  76 y/o male with pmh of OSA, CKD, HTN, DM type 1 with peripheral neuropathy presents for follow up of low back pain > neck pain.   On first visit, pt complained of b/l L>R back pain.  Started ~>20 years ago.  No inciting event.  Sitting/laying improve the pain.  Standing exacerbates the pain.  Sharp, burning pain.  Radiates to left posterior thigh.  Intermittent.  He has associated numbness/tingling.  He only tried ASA, which helps some.  Pain limits from doing ADLs and fishing.  He denies falls.  Last clinic visit  01/04/17.  Since last visit he picked up his TENS unit and finds benefit.  He still has not started pool therapy. The Gabapentin causes cognitive side effects. He obtained the brace 2 days.  He states he wore it all day with benefit, encouraged limited use. He has not made any improvement with weight loss.   Pain Inventory Average Pain 7 Pain Right Now 6 My pain is intermittent, sharp, burning and tingling  In the last 24 hours, has pain interfered with the following? General activity 5 Relation with others 1 Enjoyment of life 3 What TIME of day is your pain at its worst? morning Sleep (in general) Good  Pain is worse with: walking, bending and some activites Pain improves with: rest and TENS Relief from Meds: 5  Mobility walk without assistance how many minutes can you walk? 10 ability to climb steps?  yes do you drive?  yes Do you have any goals in this area?  yes  Function retired Do you have any goals in this area?  no  Neuro/Psych weakness numbness tingling  Prior Studies Any changes since last visit?  no  Physicians involved in your care Any changes since last visit?  no   Family History  Problem Relation Age of Onset  . Colon cancer Brother 70  . Colon polyps Brother   . Cancer Brother     lung cancer, deceased  . Other Mother     MVA, deceased  21s  . Healthy Daughter   . Healthy Son   . Rectal cancer Neg Hx   . Stomach cancer Neg Hx    Social History   Social History  . Marital status: Married    Spouse name: N/A  . Number of children: N/A  . Years of education: N/A   Occupational History  .  [employer]   Social History Main Topics  . Smoking status: Former Smoker    Packs/day: 2.00    Years: 31.00    Types: Cigarettes    Quit date: 10/23/1982  . Smokeless tobacco: Never Used  . Alcohol use No  . Drug use: No  . Sexual activity: Not Asked   Other Topics Concern  . None   Social History Narrative   Lives with wife in a one story home.  Has a son and a daughter.     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     Education: 2 years of college.      Past Surgical History:  Procedure Laterality Date  . Abdominal US  09/1997  . arm fracture Left 1958   with hardware  . Colon cancer screening    . COLONOSCOPY  08/18/2004   diverticulitis  . COLONOSCOPY  09/24/2009  . ELECTROCARDIOGRAM  02/2006  .  FLEXIBLE SIGMOIDOSCOPY  03/04/2001   polyps, anal fissure  . GANGLION CYST EXCISION Left 1980  . Lower Arterial  04/13/2004  . POLYPECTOMY    . Rest Cardiolite  03/19/2003   Past Medical History:  Diagnosis Date  . Allergy   . Anal fissure   . Anemia, unspecified   . Arthritis    Spinal OA  . BACK PAIN, LUMBAR 10/23/2007  . CARDIAC MURMUR, SYSTOLIC 12/29/9371  . DIABETES MELLITUS, TYPE I 05/20/2007  . DIASTOLIC DYSFUNCTION 02/18/7680  . 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[place])   . Duodenitis   . Edema 05/12/2008  . Esophageal stricture   . GERD (gastroesophageal reflux disease)   . 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 Gynecomastia   . Hepatic steatosis   . HYPERLIPIDEMIA 05/20/2007  . Hypertension   . Hypogonadism male   . Morbid obesity ([place]) 09/10/2009  . OBSTRUCTIVE SLEEP APNEA 11/04/2007  . Personal history of colonic polyps   . RENAL INSUFFICIENCY 05/12/2008  . Sleep apnea    uses cpap    . Urolithiasis    BP 133/78 (BP Location: Left Arm, Patient Position: Sitting, Cuff Size: Large)   Pulse 78   Resp 14   SpO2 96%   Opioid Risk Score:   Fall Risk Score:  `1  Depression screen PHQ 2/9  Depression screen PHQ 2/9 08/31/2016  Decreased Interest 0  Down, Depressed, Hopeless 0  PHQ - 2 Score 0  Altered sleeping 0  Tired, decreased energy 1  Change in appetite 0  Feeling bad or failure about yourself  0  Trouble concentrating 0  Moving slowly or fidgety/restless 0  Suicidal thoughts 0  PHQ-9 Score 1  Difficult doing work/chores Somewhat difficult    Review of Systems  Constitutional: Positive for diaphoresis.  HENT: Negative.   Eyes: Negative.   Respiratory: Negative.   Cardiovascular: Negative.   Gastrointestinal: Negative.   Endocrine:       Diabetic High blood sugar  Genitourinary: Positive for difficulty urinating and dysuria.       Retention   Musculoskeletal: Positive for arthralgias, back pain, myalgias and neck pain.  Skin: Negative.   Allergic/Immunologic: Negative.   Neurological: Positive for weakness and numbness.       Tingling  Hematological: Negative.   Psychiatric/Behavioral: Negative.   All other systems reviewed and are negative.     Objective:   Physical Exam Gen: NAD. Vital signs reviewed HENT: Normocephalic, Atraumatic Eyes: EOMI. No discharge.  Cardio: RRR. No JVD. Pulm: B/l clear to auscultation.  Effort normal Abd: Soft, BS+ MSK:  Gait WNL.   +TTP b/l SI joints  No edema.   ROM WNL Neuro:   Sensation diminished to light touch b/l LE >feet.   Strength  5/5 in all LE myotomes Skin: Warm and Dry. Intact.     Assessment & Plan:  76 y/o male with pmh of OSA, CKD, HTN, DM type 1 with peripheral neuropathy presents for follow up of low back pain > neck pain.    1. Chronic mechanical low back pain  MRIs C,L-spine early 2016 suggesting multilevel spondylosis with mild b/l multilevel foraminal narrowing C4-C7 and shallow  disc bulge at L5-S1 without central canal or foraminal stenosis.  Avoid NSAIDs due to CKD  Unable to tolerate Gabapentin  Cont HEP  Cont TENs  Encouraged aquatic therapy, again, pt state he is to start next week  Cont tylenol  Cont Robaxin 500 TID PRN  Cont Cymbalta 60mg   Cont back brace  during periods of excessive activity, reminded to avoid prolonged use  Will order Lyrica 50 TID  Will order Voltaren gel   2. Sacroiliitis  See #1  Pt to obtain joint belt  Will consider injections in future  3. Sleep disturbance  Cont new mattress  Cont CPAP  4. Myalgia  Will schedule trigger point injection to sacral PSPs   5. Diabetic neuropathy  See #1  6. Morbid obesity  Pt not interested in seeing dietitian at this time

## 2017-02-15 NOTE — Addendum Note (Signed)
Addended by: Delice Lesch A on: 02/15/2017 10:46 AM   Modules accepted: Orders

## 2017-02-22 ENCOUNTER — Encounter: Payer: Medicare Other | Attending: Physical Medicine & Rehabilitation | Admitting: Physical Medicine & Rehabilitation

## 2017-02-22 ENCOUNTER — Encounter: Payer: Self-pay | Admitting: Physical Medicine & Rehabilitation

## 2017-02-22 VITALS — BP 112/72 | HR 78

## 2017-02-22 DIAGNOSIS — E1042 Type 1 diabetes mellitus with diabetic polyneuropathy: Secondary | ICD-10-CM | POA: Diagnosis not present

## 2017-02-22 DIAGNOSIS — N189 Chronic kidney disease, unspecified: Secondary | ICD-10-CM | POA: Diagnosis not present

## 2017-02-22 DIAGNOSIS — M1990 Unspecified osteoarthritis, unspecified site: Secondary | ICD-10-CM | POA: Diagnosis not present

## 2017-02-22 DIAGNOSIS — M545 Low back pain: Secondary | ICD-10-CM | POA: Insufficient documentation

## 2017-02-22 DIAGNOSIS — M542 Cervicalgia: Secondary | ICD-10-CM | POA: Insufficient documentation

## 2017-02-22 DIAGNOSIS — E1022 Type 1 diabetes mellitus with diabetic chronic kidney disease: Secondary | ICD-10-CM | POA: Diagnosis not present

## 2017-02-22 DIAGNOSIS — I129 Hypertensive chronic kidney disease with stage 1 through stage 4 chronic kidney disease, or unspecified chronic kidney disease: Secondary | ICD-10-CM | POA: Insufficient documentation

## 2017-02-22 DIAGNOSIS — G8929 Other chronic pain: Secondary | ICD-10-CM | POA: Insufficient documentation

## 2017-02-22 DIAGNOSIS — M461 Sacroiliitis, not elsewhere classified: Secondary | ICD-10-CM | POA: Insufficient documentation

## 2017-02-22 DIAGNOSIS — E785 Hyperlipidemia, unspecified: Secondary | ICD-10-CM | POA: Diagnosis not present

## 2017-02-22 DIAGNOSIS — M109 Gout, unspecified: Secondary | ICD-10-CM | POA: Diagnosis not present

## 2017-02-22 DIAGNOSIS — M791 Myalgia, unspecified site: Secondary | ICD-10-CM

## 2017-02-22 DIAGNOSIS — Z87891 Personal history of nicotine dependence: Secondary | ICD-10-CM | POA: Insufficient documentation

## 2017-02-22 DIAGNOSIS — G4733 Obstructive sleep apnea (adult) (pediatric): Secondary | ICD-10-CM | POA: Insufficient documentation

## 2017-02-22 DIAGNOSIS — K219 Gastro-esophageal reflux disease without esophagitis: Secondary | ICD-10-CM | POA: Diagnosis not present

## 2017-02-22 NOTE — Progress Notes (Signed)
Trigger point injection procedure note: Trigger Point Injection: Written consent was obtained for the patient. Trigger points were identified of bilateral lumbosacral PSP muscles. The areas were cleaned with alcohol, and each of  these trigger points were sprayed with vapocoolant and injected with 1 cc of 0.5% Marcaine (x5). Needle draw back was performed. There were no complications from the procedure, and it was well tolerated.

## 2017-02-28 ENCOUNTER — Other Ambulatory Visit: Payer: Self-pay | Admitting: Endocrinology

## 2017-03-01 ENCOUNTER — Encounter (HOSPITAL_BASED_OUTPATIENT_CLINIC_OR_DEPARTMENT_OTHER): Payer: Medicare Other | Admitting: Physical Medicine & Rehabilitation

## 2017-03-01 ENCOUNTER — Encounter: Payer: Self-pay | Admitting: Physical Medicine & Rehabilitation

## 2017-03-01 VITALS — BP 143/77 | HR 77 | Resp 14

## 2017-03-01 DIAGNOSIS — G8929 Other chronic pain: Secondary | ICD-10-CM | POA: Diagnosis not present

## 2017-03-01 DIAGNOSIS — M791 Myalgia, unspecified site: Secondary | ICD-10-CM

## 2017-03-01 NOTE — Progress Notes (Signed)
Trigger point injection procedure note: Trigger Point Injection: Written consent was obtained for the patient. Trigger points were identified of bilateral lumbosacral PSP muscles. The areas were cleaned with alcohol, and each of  these trigger points were sprayed with vapocoolant and injected with 1 cc of 0.5% Marcaine (x5). Needle draw back was performed. There were no complications from the procedure, and it was well tolerated.

## 2017-03-15 ENCOUNTER — Encounter: Payer: Self-pay | Admitting: Physical Medicine & Rehabilitation

## 2017-03-15 ENCOUNTER — Encounter (HOSPITAL_BASED_OUTPATIENT_CLINIC_OR_DEPARTMENT_OTHER): Payer: Medicare Other | Admitting: Physical Medicine & Rehabilitation

## 2017-03-15 VITALS — BP 123/79 | HR 76 | Resp 14

## 2017-03-15 DIAGNOSIS — M791 Myalgia, unspecified site: Secondary | ICD-10-CM

## 2017-03-15 DIAGNOSIS — E1142 Type 2 diabetes mellitus with diabetic polyneuropathy: Secondary | ICD-10-CM

## 2017-03-15 DIAGNOSIS — G479 Sleep disorder, unspecified: Secondary | ICD-10-CM | POA: Diagnosis not present

## 2017-03-15 DIAGNOSIS — M545 Low back pain: Secondary | ICD-10-CM | POA: Diagnosis not present

## 2017-03-15 DIAGNOSIS — M461 Sacroiliitis, not elsewhere classified: Secondary | ICD-10-CM

## 2017-03-15 DIAGNOSIS — G8929 Other chronic pain: Secondary | ICD-10-CM | POA: Diagnosis not present

## 2017-03-15 MED ORDER — METHOCARBAMOL 500 MG PO TABS: 500.0000 mg | ORAL_TABLET | Freq: Three times a day (TID) | ORAL | 1 refills | Status: DC | PRN

## 2017-03-15 NOTE — Progress Notes (Signed)
Subjective:    Patient ID: [name], male    DOB: 31-Jan-1941, 76 y.o.   MRN: [id number]  HPI  76 y/o male with pmh of OSA, CKD, HTN, DM type 1 with peripheral neuropathy presents for follow up of low back pain > neck pain.   On first visit, pt complained of b/l L>R back pain.  Started ~>20 years ago.  No inciting event.  Sitting/laying improve the pain.  Standing exacerbates the pain.  Sharp, burning pain.  Radiates to left posterior thigh.  Intermittent.  He has associated numbness/tingling.  He only tried ASA, which helps some.  Pain limits from doing ADLs and fishing.  He denies falls.  Last clinic visit  03/01/17.  Since last visit, he was started Lyrica, but is causes sedation.  He had trigger point injection, which provided benefit, but had increased activity after the last injection and notes return of symptoms, but overall improved.  He still has not gone to pool therapy.  He uses the back brace with excessive activity.  Voltaren gel was denied.   Pain Inventory Average Pain 4 Pain Right Now 6 My pain is sharp and burning  In the last 24 hours, has pain interfered with the following? General activity 2 Relation with others 0 Enjoyment of life 1 What TIME of day is your pain at its worst? morning Sleep (in general) Good  Pain is worse with: walking, bending, standing and some activites Pain improves with: rest, heat/ice and TENS Relief from Meds: 3  Mobility walk without assistance how many minutes can you walk? 10 ability to climb steps?  yes do you drive?  yes Do you have any goals in this area?  no  Function retired Do you have any goals in this area?  no  Neuro/Psych numbness tingling confusion  Prior Studies Any changes since last visit?  no  Physicians involved in your care Any changes since last visit?  no   Family History  Problem Relation Age of Onset  . Colon cancer Brother 76  . Colon polyps Brother   . Cancer Brother        lung cancer,  deceased  . Other Mother        MVA, deceased 16s  . Healthy Daughter   . Healthy Son   . Rectal cancer Neg Hx   . Stomach cancer Neg Hx    Social History   Social History  . Marital status: Married    Spouse name: N/A  . Number of children: N/A  . Years of education: N/A   Occupational History  .  [employer]   Social History Main Topics  . Smoking status: Former Smoker    Packs/day: 2.00    Years: 31.00    Types: Cigarettes    Quit date: 10/23/1982  . Smokeless tobacco: Never Used  . Alcohol use No  . Drug use: No  . Sexual activity: Not Asked   Other Topics Concern  . None   Social History Narrative   Lives with wife in a one story home.  Has a son and a daughter.     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     Education: 2 years of college.      Past Surgical History:  Procedure Laterality Date  . Abdominal US  09/1997  . arm fracture Left 1958   with hardware  . Colon cancer screening    . COLONOSCOPY  08/18/2004   diverticulitis  .  COLONOSCOPY  09/24/2009  . ELECTROCARDIOGRAM  02/2006  . FLEXIBLE SIGMOIDOSCOPY  03/04/2001   polyps, anal fissure  . GANGLION CYST EXCISION Left 1980  . Lower Arterial  04/13/2004  . POLYPECTOMY    . Rest Cardiolite  03/19/2003   Past Medical History:  Diagnosis Date  . Allergy   . Anal fissure   . Anemia, unspecified   . Arthritis    Spinal OA  . BACK PAIN, LUMBAR 10/23/2007  . CARDIAC MURMUR, SYSTOLIC 03/28/5992  . DIABETES MELLITUS, TYPE I 05/20/2007  . DIASTOLIC DYSFUNCTION 5/70/1779  . 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[place])   . Duodenitis   . Edema 05/12/2008  . Esophageal stricture   . GERD (gastroesophageal reflux disease)   . 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 Gynecomastia   . Hepatic steatosis   . HYPERLIPIDEMIA 05/20/2007  . Hypertension   . Hypogonadism male   . Morbid obesity ([place]) 09/10/2009  . OBSTRUCTIVE SLEEP APNEA 11/04/2007  . Personal history of colonic polyps   . RENAL  INSUFFICIENCY 05/12/2008  . Sleep apnea    uses cpap  . Urolithiasis    BP 123/79 (BP Location: Left Arm, Patient Position: Sitting, Cuff Size: Large)   Pulse 76   Resp 14   SpO2 95%   Opioid Risk Score:   Fall Risk Score:  `1  Depression screen PHQ 2/9  Depression screen PHQ 2/9 08/31/2016  Decreased Interest 0  Down, Depressed, Hopeless 0  PHQ - 2 Score 0  Altered sleeping 0  Tired, decreased energy 1  Change in appetite 0  Feeling bad or failure about yourself  0  Trouble concentrating 0  Moving slowly or fidgety/restless 0  Suicidal thoughts 0  PHQ-9 Score 1  Difficult doing work/chores Somewhat difficult    Review of Systems  Constitutional: Positive for diaphoresis.  HENT: Negative.   Eyes: Negative.   Respiratory: Negative.   Cardiovascular: Negative.   Gastrointestinal: Negative.   Endocrine:       Diabetic High blood sugar  Genitourinary: Positive for difficulty urinating and hematuria.          Musculoskeletal: Positive for arthralgias, back pain, myalgias and neck pain.  Skin: Negative.   Allergic/Immunologic: Negative.   Neurological: Positive for weakness and numbness.       Tingling  Hematological: Negative.   Psychiatric/Behavioral: Positive for confusion.  All other systems reviewed and are negative.     Objective:   Physical Exam Gen: NAD. Vital signs reviewed HENT: Normocephalic, Atraumatic Eyes: EOMI. No discharge.  Cardio: RRR. No JVD. Pulm: B/l clear to auscultation.  Effort normal  Abd: Soft, BS+ MSK:  Gait WNL.   +TTP b/l SI joints  No edema.   ROM WNL Neuro:   Sensation diminished to light touch b/l LE >feet.   Strength  5/5 in all LE myotomes Skin: Warm and Dry. Intact.     Assessment & Plan:  76 y/o male with pmh of OSA, CKD, HTN, DM type 1 with peripheral neuropathy presents for follow up of low back pain > neck pain.    1. Chronic mechanical low back pain  MRIs C,L-spine early 2016 suggesting multilevel spondylosis  with mild b/l multilevel foraminal narrowing C4-C7 and shallow disc bulge at L5-S1 without central canal or foraminal stenosis.  Avoid NSAIDs due to CKD  Unable to tolerate Gabapentin, Lyrica  Voltaren gel denied by insurance   Cont HEP  Cont TENs  Encouraged aquatic therapy, 3rd time  Cont tylenol  Cont  Robaxin 500 TID PRN, encouraged changing of timing  Cont Cymbalta 60mg   Cont back brace during periods of excessive activity, reminded to avoid prolonged use  Encouraged Lidoderm OTC  2. Sacroiliitis  See #1  Cont brace  Not main pain generator at present  3. Sleep disturbance  Cont new mattress  Cont CPAP  4. Myalgia  Good benefit with trigger point injections, will schedule again in 2 weeks  5. Diabetic neuropathy  See #1  6. Morbid obesity  Pt not interested in seeing dietitian at this time

## 2017-03-30 ENCOUNTER — Encounter: Payer: Medicare Other | Attending: Physical Medicine & Rehabilitation | Admitting: Physical Medicine & Rehabilitation

## 2017-03-30 ENCOUNTER — Encounter: Payer: Self-pay | Admitting: Physical Medicine & Rehabilitation

## 2017-03-30 VITALS — BP 152/83 | HR 75 | Resp 14

## 2017-03-30 DIAGNOSIS — M461 Sacroiliitis, not elsewhere classified: Secondary | ICD-10-CM | POA: Diagnosis not present

## 2017-03-30 DIAGNOSIS — M109 Gout, unspecified: Secondary | ICD-10-CM | POA: Insufficient documentation

## 2017-03-30 DIAGNOSIS — M1990 Unspecified osteoarthritis, unspecified site: Secondary | ICD-10-CM | POA: Insufficient documentation

## 2017-03-30 DIAGNOSIS — M791 Myalgia, unspecified site: Secondary | ICD-10-CM

## 2017-03-30 DIAGNOSIS — G4733 Obstructive sleep apnea (adult) (pediatric): Secondary | ICD-10-CM | POA: Insufficient documentation

## 2017-03-30 DIAGNOSIS — M542 Cervicalgia: Secondary | ICD-10-CM | POA: Insufficient documentation

## 2017-03-30 DIAGNOSIS — M545 Low back pain: Secondary | ICD-10-CM | POA: Diagnosis present

## 2017-03-30 DIAGNOSIS — E1042 Type 1 diabetes mellitus with diabetic polyneuropathy: Secondary | ICD-10-CM | POA: Insufficient documentation

## 2017-03-30 DIAGNOSIS — K219 Gastro-esophageal reflux disease without esophagitis: Secondary | ICD-10-CM | POA: Diagnosis not present

## 2017-03-30 DIAGNOSIS — N189 Chronic kidney disease, unspecified: Secondary | ICD-10-CM | POA: Diagnosis not present

## 2017-03-30 DIAGNOSIS — I129 Hypertensive chronic kidney disease with stage 1 through stage 4 chronic kidney disease, or unspecified chronic kidney disease: Secondary | ICD-10-CM | POA: Diagnosis not present

## 2017-03-30 DIAGNOSIS — G8929 Other chronic pain: Secondary | ICD-10-CM | POA: Diagnosis not present

## 2017-03-30 DIAGNOSIS — Z87891 Personal history of nicotine dependence: Secondary | ICD-10-CM | POA: Diagnosis not present

## 2017-03-30 DIAGNOSIS — E785 Hyperlipidemia, unspecified: Secondary | ICD-10-CM | POA: Diagnosis not present

## 2017-03-30 DIAGNOSIS — E1022 Type 1 diabetes mellitus with diabetic chronic kidney disease: Secondary | ICD-10-CM | POA: Diagnosis not present

## 2017-03-30 NOTE — Progress Notes (Signed)
Trigger point injection procedure note: Trigger Point Injection: Written consent was obtained for the patient. Trigger points were identified of bilateral lumbosacral PSP muscles. The areas were cleaned with alcohol, and each of  these trigger points were sprayed with vapocoolant and injected with 1 cc of 0.5% Marcaine (x4). Needle draw back was performed. There were no complications from the procedure, and it was well tolerated.   Plan: RTC as scheduled Pt notes increase in pain after sitting on wooden chair for several hours.  Encouraged to to monitor after injection.  May need additional L-spine imaging.

## 2017-04-15 IMAGING — MR MR KNEE*R* W/O CM
4 of 5 series · 19 of 40 positions shown · non-contrast
Comparison: None.

CLINICAL DATA: Right knee injury 20 years ago but now experiencing
joint stiffness and buckling of the knee. Pressure and pain
anteriorly.

EXAM:
MRI OF THE RIGHT KNEE WITHOUT CONTRAST
TECHNIQUE: Multiplanar, multisequence MR imaging of the knee was performed. No
intravenous contrast was administered.

[Series 3: pd_tse_fs_tra · axial · 4.0mm · 0.42mm/px · z∈[-52,+38]mm · 3 of 25 slices shown]
[im 4/25]
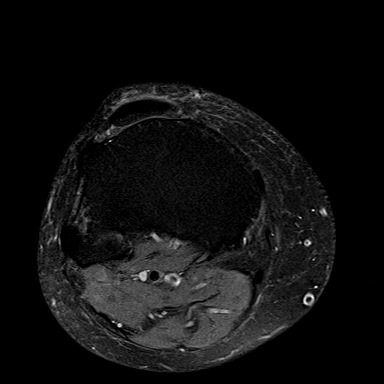
[im 13/25]
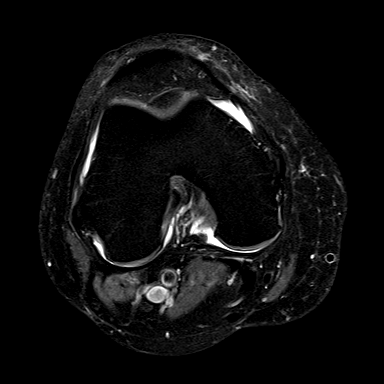
[im 22/25]
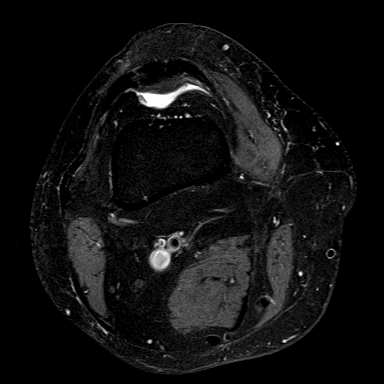

[Series 5: T2 fat-sat · coronal · 3.2mm · 0.29mm/px · 6 of 25 slices shown]
[im 1/25]
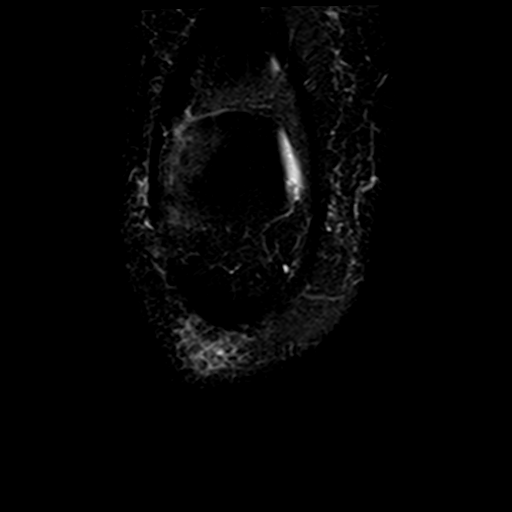
[im 4/25]
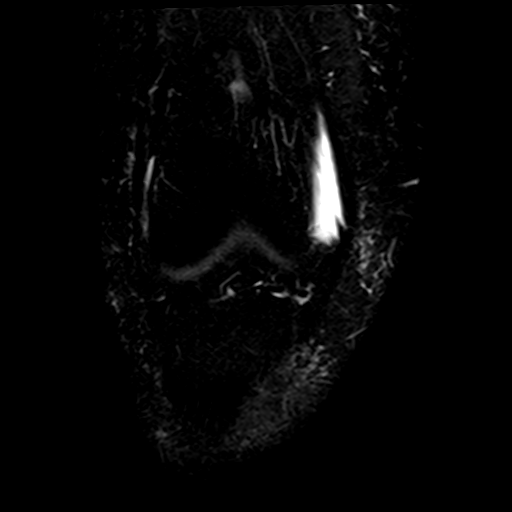
[im 7/25]
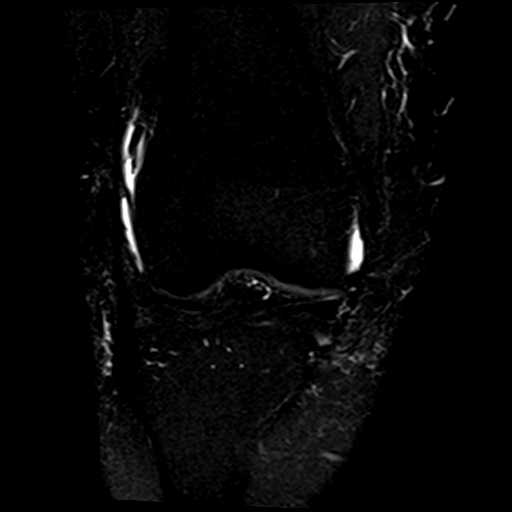
[im 11/25]
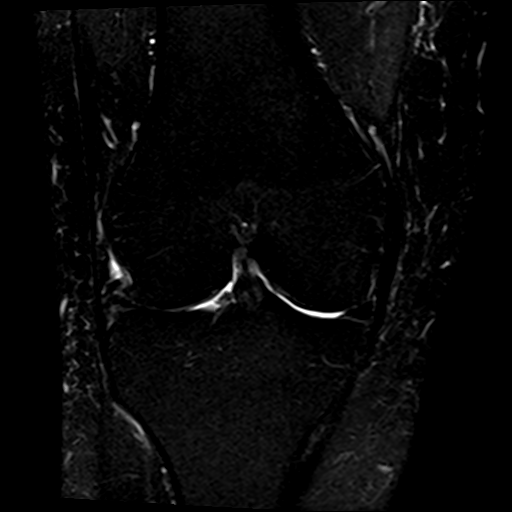
[im 14/25]
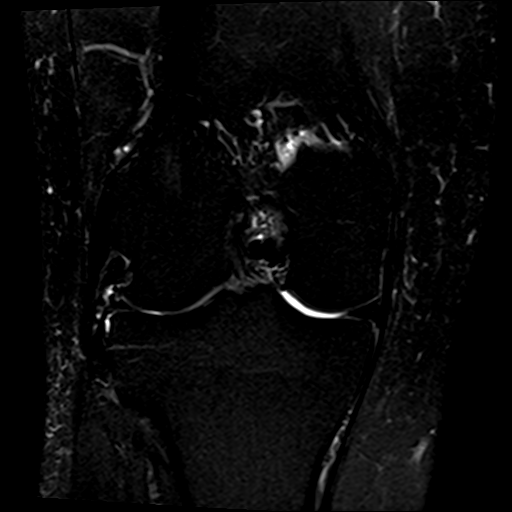
[im 21/25]
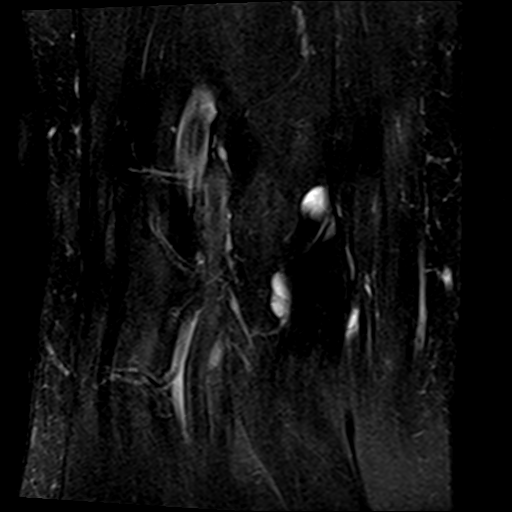

[Series 6: PD fat-sat · sagittal · 4.0mm · 0.31mm/px · 7 of 21 slices shown]
[im 1/21]
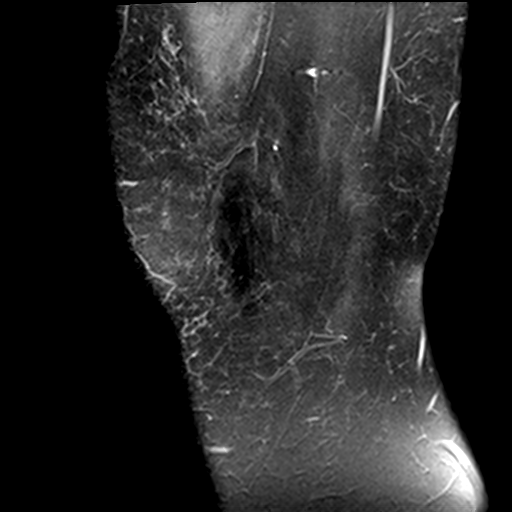
[im 4/21]
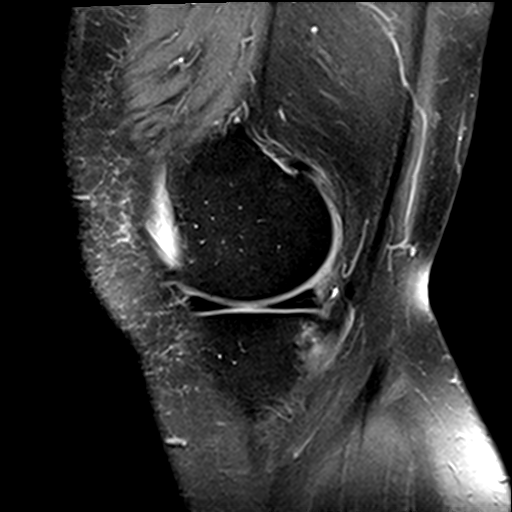
[im 7/21]
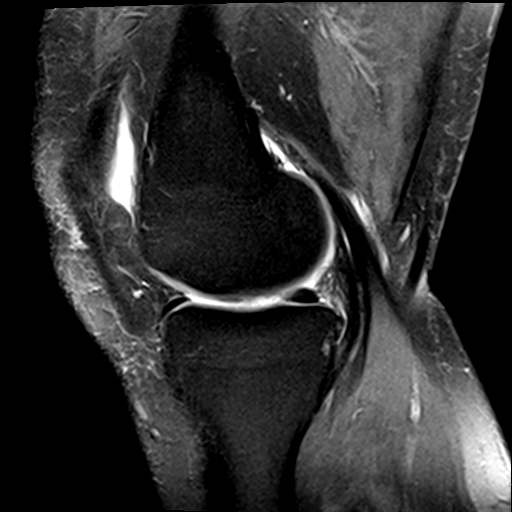
[im 11/21]
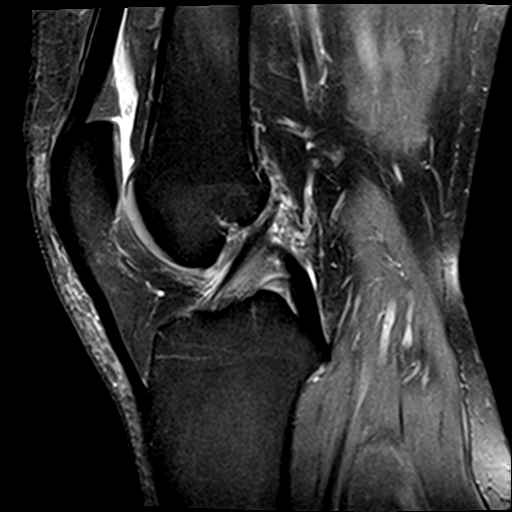
[im 14/21]
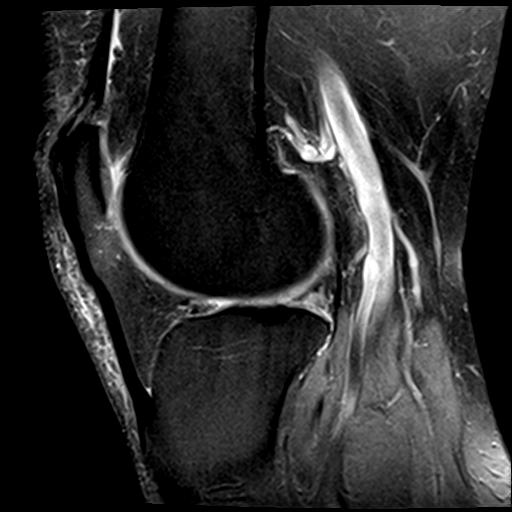
[im 17/21]
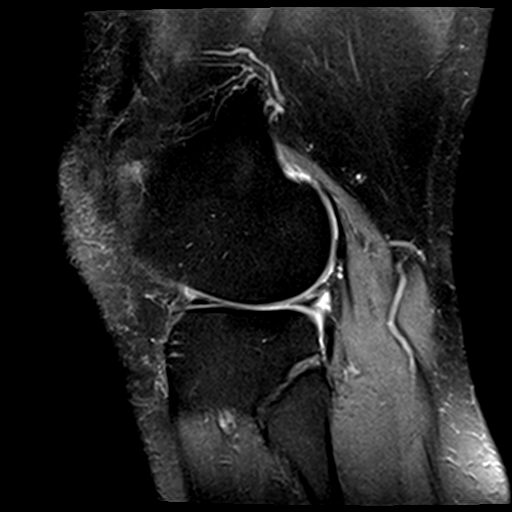
[im 21/21]
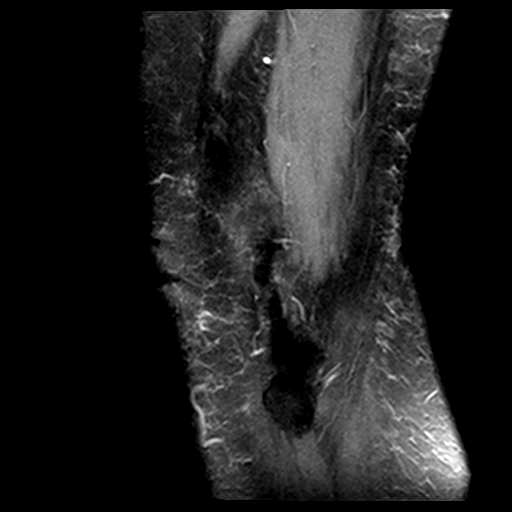

[Series 7: T1 · coronal · 3.2mm · 0.23mm/px · 3 of 25 slices shown]
[im 4/25]
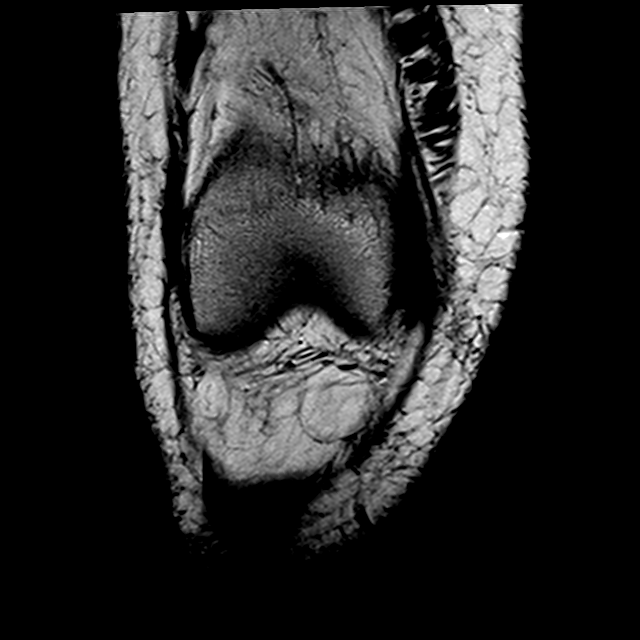
[im 14/25]
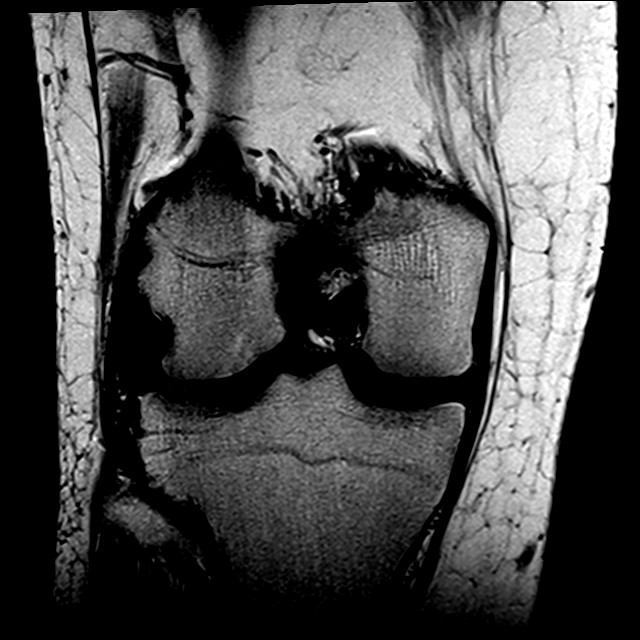
[im 21/25]
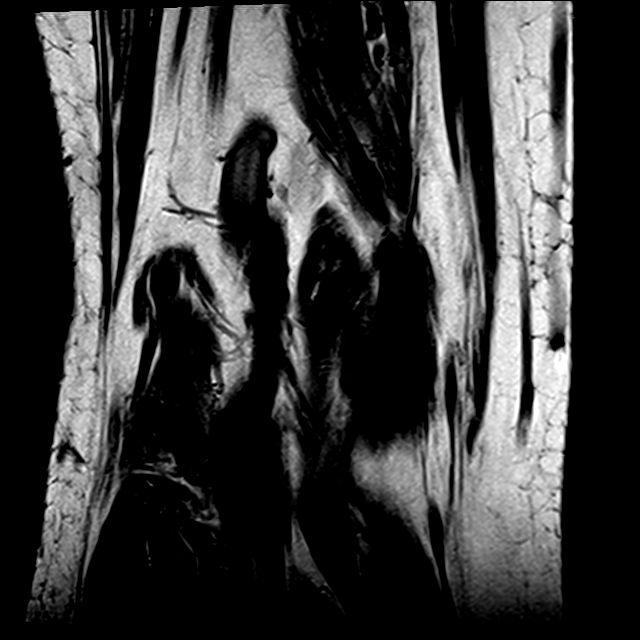

[19 of 40 positions shown; findings below may reference images not displayed]

FINDINGS: MENISCI

Medial meniscus:  Intact.

Lateral meniscus: Complex tear of the posterior and posteromedial
corner of the lateral meniscus with mucoid degeneration.

LIGAMENTS

Cruciates:  Intact ACL and PCL.

Collaterals: Medial collateral ligament is intact. Lateral
collateral ligament complex is intact.

CARTILAGE

Patellofemoral:  No chondral defect.

Medial: Mild partial-thickness cartilage loss of the medial
femorotibial compartment involving the medial condylar cartilage.

Lateral:  Mild chondral thinning and minimal fissuring posteriorly.

Joint: Small suprapatellar joint effusion. Normal Hoffa's fat pad.
No plical thickening.

Popliteal Fossa:  Small popliteal cyst.  Intact popliteus tendon.

Extensor Mechanism:  Intact quadriceps tendon and patellar tendon.

Bones: Small subcortical cysts of the posteromedial tibial
epiphysis.

No bone marrow edema or fracture.

Other: None.
IMPRESSION: Mucoid degeneration of the posterior horn of the lateral meniscus
with complex tear in the posteromedial corner. Mild chondromalacia
of the femorotibial cartilage.

Intact cruciate and collateral ligaments.

## 2017-04-19 ENCOUNTER — Encounter (HOSPITAL_BASED_OUTPATIENT_CLINIC_OR_DEPARTMENT_OTHER): Payer: Medicare Other | Admitting: Physical Medicine & Rehabilitation

## 2017-04-19 ENCOUNTER — Encounter: Payer: Self-pay | Admitting: Physical Medicine & Rehabilitation

## 2017-04-19 VITALS — BP 142/68 | HR 78 | Resp 14

## 2017-04-19 DIAGNOSIS — G479 Sleep disorder, unspecified: Secondary | ICD-10-CM | POA: Diagnosis not present

## 2017-04-19 DIAGNOSIS — G8929 Other chronic pain: Secondary | ICD-10-CM | POA: Diagnosis not present

## 2017-04-19 DIAGNOSIS — M791 Myalgia, unspecified site: Secondary | ICD-10-CM

## 2017-04-19 DIAGNOSIS — M545 Low back pain: Secondary | ICD-10-CM | POA: Diagnosis not present

## 2017-04-19 DIAGNOSIS — M461 Sacroiliitis, not elsewhere classified: Secondary | ICD-10-CM | POA: Diagnosis not present

## 2017-04-19 DIAGNOSIS — E1142 Type 2 diabetes mellitus with diabetic polyneuropathy: Secondary | ICD-10-CM | POA: Diagnosis not present

## 2017-04-19 MED ORDER — METHOCARBAMOL 500 MG PO TABS: 500.0000 mg | ORAL_TABLET | Freq: Three times a day (TID) | ORAL | 1 refills | Status: DC | PRN

## 2017-04-19 MED ORDER — AMITRIPTYLINE HCL 10 MG PO TABS: 10.0000 mg | ORAL_TABLET | Freq: Every day | ORAL | 1 refills | Status: DC

## 2017-04-19 NOTE — Progress Notes (Signed)
Subjective:    Patient ID: [name], male    DOB: 09-21-1941, 76 y.o.   MRN: [id number]  HPI  76 y/o male with pmh of OSA, CKD, HTN, DM type 1 with peripheral neuropathy presents for follow up of low back pain > neck pain.   On first visit, pt complained of b/l L>R back pain.  Started ~>20 years ago.  No inciting event.  Sitting/laying improve the pain.  Standing exacerbates the pain.  Sharp, burning pain.  Radiates to left posterior thigh.  Intermittent.  He has associated numbness/tingling.  He only tried ASA, which helps some.  Pain limits from doing ADLs and fishing.  He denies falls.  Last clinic visit  03/30/17.  He received trigger point injections on last visit. He had good relief with injections for several days, but then pain returned.  He believes he is functionally more active. He continues to ambulated and stretching.  He went to pool therapy twice.  He notices some benefit. He is doing well with Robaxin. OTC lidoderm is helping.   Pain Inventory Average Pain 3 Pain Right Now 5 My pain is constant, sharp, burning and tingling  In the last 24 hours, has pain interfered with the following? General activity 3 Relation with others 0 Enjoyment of life 2 What TIME of day is your pain at its worst? morning Sleep (in general) Good  Pain is worse with: walking and standing Pain improves with: heat/ice, TENS and injections Relief from Meds: no selection  Mobility walk without assistance use a cane ability to climb steps?  yes do you drive?  yes  Function retired  Neuro/Psych weakness numbness tingling trouble walking  Prior Studies Any changes since last visit?  no  Physicians involved in your care Any changes since last visit?  no   Family History  Problem Relation Age of Onset  . Colon cancer Brother 36  . Colon polyps Brother   . Cancer Brother        lung cancer, deceased  . Other Mother        MVA, deceased 64s  . Healthy Daughter   . Healthy Son     . Rectal cancer Neg Hx   . Stomach cancer Neg Hx    Social History   Social History  . Marital status: Married    Spouse name: N/A  . Number of children: N/A  . Years of education: N/A   Occupational History  .  [employer]   Social History Main Topics  . Smoking status: Former Smoker    Packs/day: 2.00    Years: 31.00    Types: Cigarettes    Quit date: 10/23/1982  . Smokeless tobacco: Never Used  . Alcohol use No  . Drug use: No  . Sexual activity: Not Asked   Other Topics Concern  . None   Social History Narrative   Lives with wife in a one story home.  Has a son and a daughter.     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     Education: 2 years of college.      Past Surgical History:  Procedure Laterality Date  . Abdominal US  09/1997  . arm fracture Left 1958   with hardware  . Colon cancer screening    . COLONOSCOPY  08/18/2004   diverticulitis  . COLONOSCOPY  09/24/2009  . ELECTROCARDIOGRAM  02/2006  . FLEXIBLE SIGMOIDOSCOPY  03/04/2001   polyps, anal fissure  .  GANGLION CYST EXCISION Left 1980  . Lower Arterial  04/13/2004  . POLYPECTOMY    . Rest Cardiolite  03/19/2003   Past Medical History:  Diagnosis Date  . Allergy   . Anal fissure   . Anemia, unspecified   . Arthritis    Spinal OA  . BACK PAIN, LUMBAR 10/23/2007  . CARDIAC MURMUR, SYSTOLIC 5/0/9326  . DIABETES MELLITUS, TYPE I 05/20/2007  . DIASTOLIC DYSFUNCTION 05/04/4579  . 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[place])   . Duodenitis   . Edema 05/12/2008  . Esophageal stricture   . GERD (gastroesophageal reflux disease)   . 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 Gynecomastia   . Hepatic steatosis   . HYPERLIPIDEMIA 05/20/2007  . Hypertension   . Hypogonadism male   . Morbid obesity ([place]) 09/10/2009  . OBSTRUCTIVE SLEEP APNEA 11/04/2007  . Personal history of colonic polyps   . RENAL INSUFFICIENCY 05/12/2008  . Sleep apnea    uses cpap  . Urolithiasis    BP (!) 142/68 (BP  Location: Left Arm, Patient Position: Sitting, Cuff Size: Large)   Pulse 78   Resp 14   SpO2 97%   Opioid Risk Score:   Fall Risk Score:  `1  Depression screen PHQ 2/9  Depression screen PHQ 2/9 08/31/2016  Decreased Interest 0  Down, Depressed, Hopeless 0  PHQ - 2 Score 0  Altered sleeping 0  Tired, decreased energy 1  Change in appetite 0  Feeling bad or failure about yourself  0  Trouble concentrating 0  Moving slowly or fidgety/restless 0  Suicidal thoughts 0  PHQ-9 Score 1  Difficult doing work/chores Somewhat difficult    Review of Systems  HENT: Negative.   Eyes: Negative.   Respiratory: Negative.   Cardiovascular: Negative.   Gastrointestinal: Negative.   Endocrine:       Diabetic High blood sugar  Genitourinary: Positive for difficulty urinating.          Musculoskeletal: Positive for arthralgias, back pain, myalgias and neck pain.  Skin: Negative.   Allergic/Immunologic: Negative.   Neurological: Positive for weakness and numbness.       Tingling  Hematological: Negative.   All other systems reviewed and are negative.     Objective:   Physical Exam Gen: NAD. Vital signs reviewed HENT: Normocephalic, Atraumatic Eyes: EOMI. No discharge.  Cardio: RRR. No JVD. Pulm: B/l clear to auscultation.  Effort normal Abd: Soft, BS+ MSK:  Gait WNL.   +TTP lumbosacral PSP and b/l greater trochanter  No edema.   ROM WNL Neuro:   Sensation diminished to light touch b/l LE >feet.   Strength  5/5 in all LE myotomes Skin: Warm and Dry. Intact.     Assessment & Plan:  76 y/o male with pmh of OSA, CKD, HTN, DM type 1 with peripheral neuropathy presents for follow up of low back pain > neck pain.    1. Chronic mechanical low back pain  MRIs C,L-spine early 2016 suggesting multilevel spondylosis with mild b/l multilevel foraminal narrowing C4-C7 and shallow disc bulge at L5-S1 without central canal or foraminal stenosis.  Avoid NSAIDs due to CKD  Unable to  tolerate Gabapentin, Lyrica  Voltaren gel denied by insurance   Cont HEP  Cont TENs  Cont aquatic therapy  Cont tylenol  Cont Robaxin 500 TID PRN, pain improved with adjusted timing  Cont Cymbalta 60mg   Cont back brace during periods of excessive activity, reminded to avoid prolonged use  Cont Lidoderm OTC  2. Sacroiliitis  See #1  Cont brace  Not main pain generator at present  3. Sleep disturbance  Cont new mattress  Cont CPAP  Will trial Elavil 10qhs  4. Myalgia  Good benefit with trigger point injections  5. Diabetic neuropathy  See #1  6. Morbid obesity  Not interested in seeing dietitian at this time

## 2017-04-20 ENCOUNTER — Encounter: Payer: Self-pay | Admitting: Physical Medicine & Rehabilitation

## 2017-04-27 ENCOUNTER — Other Ambulatory Visit: Payer: Self-pay | Admitting: Endocrinology

## 2017-05-01 ENCOUNTER — Ambulatory Visit (INDEPENDENT_AMBULATORY_CARE_PROVIDER_SITE_OTHER): Payer: Medicare Other | Admitting: Endocrinology

## 2017-05-01 ENCOUNTER — Encounter: Payer: Self-pay | Admitting: Endocrinology

## 2017-05-01 VITALS — BP 132/84 | HR 72 | Ht 72.0 in | Wt 272.0 lb

## 2017-05-01 DIAGNOSIS — D61818 Other pancytopenia: Secondary | ICD-10-CM

## 2017-05-01 DIAGNOSIS — M1A9XX0 Chronic gout, unspecified, without tophus (tophi): Secondary | ICD-10-CM | POA: Diagnosis not present

## 2017-05-01 DIAGNOSIS — E0800 Diabetes mellitus due to underlying condition with hyperosmolarity without nonketotic hyperglycemic-hyperosmolar coma (NKHHC): Secondary | ICD-10-CM

## 2017-05-01 DIAGNOSIS — Z0000 Encounter for general adult medical examination without abnormal findings: Secondary | ICD-10-CM | POA: Diagnosis not present

## 2017-05-01 DIAGNOSIS — Z125 Encounter for screening for malignant neoplasm of prostate: Secondary | ICD-10-CM | POA: Diagnosis not present

## 2017-05-01 LAB — CBC WITH DIFFERENTIAL/PLATELET
Basophils Absolute: 0 10*3/uL (ref 0.0–0.1)
Basophils Relative: 0.3 % (ref 0.0–3.0)
Eosinophils Absolute: 0.1 10*3/uL (ref 0.0–0.7)
Eosinophils Relative: 1.7 % (ref 0.0–5.0)
HEMATOCRIT: 42.9 % (ref 39.0–52.0)
HEMOGLOBIN: 14.6 g/dL (ref 13.0–17.0)
Lymphocytes Relative: 27 % (ref 12.0–46.0)
Lymphs Abs: 1.4 10*3/uL (ref 0.7–4.0)
MCHC: 34 g/dL (ref 30.0–36.0)
MCV: 89.3 fl (ref 78.0–100.0)
MONO ABS: 0.3 10*3/uL (ref 0.1–1.0)
Monocytes Relative: 5.7 % (ref 3.0–12.0)
Neutro Abs: 3.3 10*3/uL (ref 1.4–7.7)
Neutrophils Relative %: 65.3 % (ref 43.0–77.0)
Platelets: 121 10*3/uL — ABNORMAL LOW (ref 150.0–400.0)
RBC: 4.81 Mil/uL (ref 4.22–5.81)
RDW: 14.9 % (ref 11.5–15.5)
WBC: 5.1 10*3/uL (ref 4.0–10.5)

## 2017-05-01 LAB — BASIC METABOLIC PANEL
BUN: 26 mg/dL — AB (ref 6–23)
CHLORIDE: 103 meq/L (ref 96–112)
CO2: 30 meq/L (ref 19–32)
Calcium: 9.8 mg/dL (ref 8.4–10.5)
Creatinine, Ser: 1.51 mg/dL — ABNORMAL HIGH (ref 0.40–1.50)
GFR: 48.05 mL/min — ABNORMAL LOW (ref >60.00)
Glucose, Bld: 248 mg/dL — ABNORMAL HIGH (ref 70–99)
POTASSIUM: 3.9 meq/L (ref 3.5–5.1)
Sodium: 141 mEq/L (ref 135–145)

## 2017-05-01 LAB — HEPATIC FUNCTION PANEL
ALBUMIN: 4 g/dL (ref 3.5–5.2)
ALK PHOS: 84 U/L (ref 39–117)
ALT: 20 U/L (ref 0–53)
AST: 17 U/L (ref 0–37)
BILIRUBIN DIRECT: 0.1 mg/dL (ref 0.0–0.3)
TOTAL PROTEIN: 7 g/dL (ref 6.0–8.3)
Total Bilirubin: 0.6 mg/dL (ref 0.2–1.2)

## 2017-05-01 LAB — URINALYSIS, ROUTINE W REFLEX MICROSCOPIC
Bilirubin Urine: NEGATIVE
HGB URINE DIPSTICK: NEGATIVE
Ketones, ur: NEGATIVE
Nitrite: NEGATIVE
PH: 6 (ref 5.0–8.0)
RBC / HPF: NONE SEEN (ref 0–?)
Specific Gravity, Urine: 1.015 (ref 1.000–1.030)
TOTAL PROTEIN, URINE-UPE24: NEGATIVE
Urine Glucose: NEGATIVE
Urobilinogen, UA: 0.2 (ref 0.0–1.0)

## 2017-05-01 LAB — LIPID PANEL
CHOL/HDL RATIO: 4
Cholesterol: 132 mg/dL (ref 0–200)
HDL: 35.3 mg/dL — ABNORMAL LOW (ref >39.00)
LDL Cholesterol: 60 mg/dL (ref 0–99)
NONHDL: 96.73
Triglycerides: 185 mg/dL — ABNORMAL HIGH (ref 0.0–149.0)
VLDL: 37 mg/dL (ref 0.0–40.0)

## 2017-05-01 LAB — TSH: TSH: 4.03 u[IU]/mL (ref 0.35–4.50)

## 2017-05-01 LAB — IBC PANEL
IRON: 112 ug/dL (ref 42–165)
Saturation Ratios: 34.5 % (ref 20.0–50.0)
Transferrin: 232 mg/dL (ref 212.0–360.0)

## 2017-05-01 LAB — MICROALBUMIN / CREATININE URINE RATIO
Creatinine,U: 71.2 mg/dL
MICROALB/CREAT RATIO: 1.1 mg/g (ref 0.0–30.0)
Microalb, Ur: 0.8 mg/dL (ref 0.0–1.9)

## 2017-05-01 LAB — URIC ACID: Uric Acid, Serum: 4.4 mg/dL (ref 4.0–7.8)

## 2017-05-01 LAB — POCT GLYCOSYLATED HEMOGLOBIN (HGB A1C): Hemoglobin A1C: 8.3

## 2017-05-01 NOTE — Patient Instructions (Addendum)
Please take these settings:  basal rate of 2 units/hr, 24 hrs per day.   mealtime bolus of 1 unit/4 grams carbohydrate.  However, subtract 15 units from your calculated lunch bolus.  continue correction bolus (which some people call "sensitivity," or "insulin sensitivity ratio," or just "isr") of 1 unit for each 10 by which your glucose exceeds.  100.   Please consider these measures for your health:  minimize alcohol.  Do not use tobacco products.  Have a colonoscopy at least every 10 years from age 37.  Keep firearms safely stored.  Always use seat belts.  have working smoke alarms in your home.  See an eye doctor and dentist regularly.  Never drive under the influence of alcohol or drugs (including prescription drugs).  Those with fair skin should take precautions against the sun, and should carefully examine their skin once per month, for any new or changed moles.   Please come back for a follow-up appointment in 2 months.

## 2017-05-01 NOTE — Progress Notes (Signed)
Subjective:    Patient ID: [name], male    DOB: 03/21/1941, 76 y.o.   MRN: [id number]  HPI Pt is here for regular wellness examination, and is feeling pretty well in general, and says chronic med probs are stable, except as noted below Past Medical History:  Diagnosis Date  . Allergy   . Anal fissure   . Anemia, unspecified   . Arthritis    Spinal OA  . BACK PAIN, LUMBAR 10/23/2007  . CARDIAC MURMUR, SYSTOLIC 11/29/621  . DIABETES MELLITUS, TYPE I 05/20/2007  . DIASTOLIC DYSFUNCTION 7/62/8315  . 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[place])   . Duodenitis   . Edema 05/12/2008  . Esophageal stricture   . GERD (gastroesophageal reflux disease)   . 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 Gynecomastia   . Hepatic steatosis   . HYPERLIPIDEMIA 05/20/2007  . Hypertension   . Hypogonadism male   . Morbid obesity ([place]) 09/10/2009  . OBSTRUCTIVE SLEEP APNEA 11/04/2007  . Personal history of colonic polyps   . RENAL INSUFFICIENCY 05/12/2008  . Sleep apnea    uses cpap  . Urolithiasis     Past Surgical History:  Procedure Laterality Date  . Abdominal US  09/1997  . arm fracture Left 1958   with hardware  . Colon cancer screening    . COLONOSCOPY  08/18/2004   diverticulitis  . COLONOSCOPY  09/24/2009  . ELECTROCARDIOGRAM  02/2006  . FLEXIBLE SIGMOIDOSCOPY  03/04/2001   polyps, anal fissure  . GANGLION CYST EXCISION Left 1980  . Lower Arterial  04/13/2004  . POLYPECTOMY    . Rest Cardiolite  03/19/2003    Social History   Social History  . Marital status: Married    Spouse name: N/A  . Number of children: N/A  . Years of education: N/A   Occupational History  .  [employer]   Social History Main Topics  . Smoking status: Former Smoker    Packs/day: 2.00    Years: 31.00    Types: Cigarettes    Quit date: 10/23/1982  . Smokeless tobacco: Never Used  . Alcohol use No  . Drug use: No  . Sexual activity: Not on file   Other Topics Concern  . Not on file   Social History  Narrative   Lives with wife in a one story home.  Has a son and a daughter.     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     Education: 2 years of college.       Current Outpatient Prescriptions on File Prior to Visit  Medication Sig Dispense Refill  . ACCU-CHEK AVIVA PLUS test strip USE TO TEST BLOOD SUGAR 6  TIMES PER DAY. 600 each 2  . ACCU-CHEK FASTCLIX LANCETS MISC Test 4 times daily as  directed 408 each 3  . alfuzosin (UROXATRAL) 10 MG 24 hr tablet Take 1 tablet (10 mg total) by mouth daily with breakfast. 90 tablet 2  . allopurinol (ZYLOPRIM) 300 MG tablet TAKE 1 TABLET BY MOUTH  DAILY 90 tablet 3  . aspirin (BAYER LOW STRENGTH) 81 MG EC tablet Take 81 mg by mouth daily.      . Blood Glucose Monitoring Suppl (ACCU-CHEK AVIVA PLUS) w/Device KIT Use as directed 1 kit 0  . carvedilol (COREG) 3.125 MG tablet TAKE 1/2 TABLETS BY MOUTH 2 TIMES DAILY WITH A MEAL. 180 tablet 3  . cycloSPORINE (RESTASIS) 0.05 % ophthalmic emulsion Place 1 drop into both eyes  2 (two) times daily.      . fluticasone (FLONASE) 50 MCG/ACT nasal spray Place 1 spray into the nose daily as needed for allergies.     . folic acid (FOLVITE) 1 MG tablet Take 2 mg by mouth 2 (two) times daily. 4 tabs daily    . furosemide (LASIX) 20 MG tablet TAKE 1 TABLET BY MOUTH  DAILY 90 tablet 3  . Insulin Infusion Pump Supplies (ACCU-CHEK PLASTIC CARTRIDGE) MISC USE AS DIRECTED PER INSULIN PUMP 30 each 3  . Insulin Infusion Pump Supplies (ULTRAFLEX) MISC CHANGE AS DIRECTED 30 each 3  . insulin lispro (HUMALOG) 100 UNIT/ML injection For use in pump, for a total of 110 units per day 110 mL 3  . methocarbamol (ROBAXIN) 500 MG tablet Take 1 tablet (500 mg total) by mouth every 8 (eight) hours as needed for muscle spasms. 90 tablet 1  . mometasone (ELOCON) 0.1 % lotion APPLY TOPICALLY DAILY 60 mL 11  . Multiple Vitamins-Minerals (CENTRUM SILVER PO) Take 1 tablet by mouth daily.      . Omega-3 Fatty Acids (FISH OIL) 1200  MG CAPS Take 1,200 mg by mouth daily.     Marland Kitchen omeprazole (PRILOSEC) 20 MG capsule TAKE 1 CAPSULE BY MOUTH  DAILY AS NEEDED 90 capsule 3  . rosuvastatin (CRESTOR) 40 MG tablet TAKE 1 TABLET BY MOUTH AT  BEDTIME 90 tablet 3  . vitamin B-12 (CYANOCOBALAMIN) 1000 MCG tablet Take 1,000 mcg by mouth daily.    Marland Kitchen amitriptyline (ELAVIL) 10 MG tablet Take 1 tablet (10 mg total) by mouth at bedtime. (Patient not taking: Reported on 05/01/2017) 30 tablet 1  . diclofenac sodium (VOLTAREN) 1 % GEL Apply 2 g topically 4 (four) times daily. (Patient not taking: Reported on 05/01/2017) 1 Tube 1  . DULoxetine (CYMBALTA) 60 MG capsule TAKE 1 CAPSULE BY MOUTH  DAILY (Patient not taking: Reported on 05/01/2017) 30 capsule 0   No current facility-administered medications on file prior to visit.     Allergies  Allergen Reactions  . Atorvastatin Other (See Comments)    unknown  . Niacin     REACTION: Severe heartburn  . Pioglitazone     REACTION: Edema    Family History  Problem Relation Age of Onset  . Colon cancer Brother 72  . Colon polyps Brother   . Cancer Brother        lung cancer, deceased  . Other Mother        MVA, deceased 57s  . Healthy Daughter   . Healthy Son   . Rectal cancer Neg Hx   . Stomach cancer Neg Hx     BP 132/84   Pulse 72   Ht 6' (1.829 m)   Wt 272 lb (123.4 kg)   SpO2 97%   BMI 36.89 kg/m     Review of Systems  Constitutional: Negative for fever and unexpected weight change.  HENT: Negative for hearing loss.   Eyes: Negative for visual disturbance.  Respiratory: Negative for shortness of breath.   Cardiovascular: Negative for chest pain.  Gastrointestinal: Negative for anal bleeding.  Endocrine: Positive for cold intolerance.  Genitourinary: Negative for difficulty urinating and hematuria.  Musculoskeletal: Negative for gait problem.  Skin: Negative for rash.  Allergic/Immunologic: Positive for environmental allergies.  Neurological: Negative for syncope and  numbness.  Hematological: Bruises/bleeds easily.  Psychiatric/Behavioral: Negative for dysphoric mood.       Objective:   Physical Exam VS: see vs page GEN: no distress HEAD:  head: no deformity eyes: no periorbital swelling, no proptosis external nose and ears are normal mouth: no lesion seen NECK: supple, thyroid is not enlarged CHEST WALL: no deformity LUNGS: clear to auscultation BREASTS:  No gynecomastia CV: reg rate and rhythm, no murmur ABD: abdomen is soft, nontender.  no hepatosplenomegaly.  not distended.  no hernia. Insulin infusion sites at the anterior abdomen are normal GENITALIA/RECTAL/PROSTATE: sees urology.   MUSCULOSKELETAL: muscle bulk and strength are grossly normal.  no obvious joint swelling.  gait is normal and steady EXTEMITIES: no deformity.  no ulcer on the feet.  feet are of normal color and temp.  no edema PULSES: dorsalis pedis intact bilat.  no carotid bruit NEURO:  cn 2-12 grossly intact.   readily moves all 4's.  sensation is intact to touch on the feet SKIN:  Normal texture and temperature.  No rash or suspicious lesion is visible.   NODES:  None palpable at the neck PSYCH: alert, well-oriented.  Does not appear anxious nor depressed.  I personally reviewed electrocardiogram tracing (today): Indication: DM Impression: NSR.  No MI.  No hypertrophy. Compared to 2017: 1D AVB is resolved     Assessment & Plan:  Wellness visit today, with problems stable, except as noted.  SEPARATE EVALUATION FOLLOWS--EACH PROBLEM HERE IS NEW, NOT RESPONDING TO TREATMENT, OR POSES SIGNIFICANT RISK TO THE PATIENT'S HEALTH: HISTORY OF THE PRESENT ILLNESS: Pt returns for f/u of diabetes mellitus: DM type: Insulin-requiring type 2 Dx'ed: 4196 Complications: polyneuropathy and renal insufficiency.  Therapy: insulin since 1995.  DKA: never.  Severe hypoglycemia: never.  Pancreatitis: never.  Other: he started pump therapy in mid-2015; he declines continuous glucose  monitor.  Interval history: he takes these pump settings: basal rate of 2 units/hr, 24 hrs per day.   mealtime bolus of 1 unit/5 grams carbohydrate.  However, subtract 12 units from your calculated lunch bolus.  continue correction bolus (which some people call "sensitivity," or "insulin sensitivity ratio," or just "isr") of 1 unit for each 10 by which your glucose exceeds 100 No recent steroids.  PAST MEDICAL HISTORY Past Medical History:  Diagnosis Date  . Allergy   . Anal fissure   . Anemia, unspecified   . Arthritis    Spinal OA  . BACK PAIN, LUMBAR 10/23/2007  . CARDIAC MURMUR, SYSTOLIC 11/24/2977  . DIABETES MELLITUS, TYPE I 05/20/2007  . DIASTOLIC DYSFUNCTION 8/92/1194  . 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[place])   . Duodenitis   . Edema 05/12/2008  . Esophageal stricture   . GERD (gastroesophageal reflux disease)   . 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 Gynecomastia   . Hepatic steatosis   . HYPERLIPIDEMIA 05/20/2007  . Hypertension   . Hypogonadism male   . Morbid obesity ([place]) 09/10/2009  . OBSTRUCTIVE SLEEP APNEA 11/04/2007  . Personal history of colonic polyps   . RENAL INSUFFICIENCY 05/12/2008  . Sleep apnea    uses cpap  . Urolithiasis     Past Surgical History:  Procedure Laterality Date  . Abdominal US  09/1997  . arm fracture Left 1958   with hardware  . Colon cancer screening    . COLONOSCOPY  08/18/2004   diverticulitis  . COLONOSCOPY  09/24/2009  . ELECTROCARDIOGRAM  02/2006  . FLEXIBLE SIGMOIDOSCOPY  03/04/2001   polyps, anal fissure  . GANGLION CYST EXCISION Left 1980  . Lower Arterial  04/13/2004  . POLYPECTOMY    . Rest Cardiolite  03/19/2003    Social History   Social  History  . Marital status: Married    Spouse name: N/A  . Number of children: N/A  . Years of education: N/A   Occupational History  .  [employer]   Social History Main Topics  . Smoking status: Former Smoker    Packs/day: 2.00    Years: 31.00    Types: Cigarettes    Quit  date: 10/23/1982  . Smokeless tobacco: Never Used  . Alcohol use No  . Drug use: No  . Sexual activity: Not on file   Other Topics Concern  . Not on file   Social History Narrative   Lives with wife in a one story home.  Has a son and a daughter.     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     Education: 2 years of college.       Current Outpatient Prescriptions on File Prior to Visit  Medication Sig Dispense Refill  . ACCU-CHEK AVIVA PLUS test strip USE TO TEST BLOOD SUGAR 6  TIMES PER DAY. 600 each 2  . ACCU-CHEK FASTCLIX LANCETS MISC Test 4 times daily as  directed 408 each 3  . alfuzosin (UROXATRAL) 10 MG 24 hr tablet Take 1 tablet (10 mg total) by mouth daily with breakfast. 90 tablet 2  . allopurinol (ZYLOPRIM) 300 MG tablet TAKE 1 TABLET BY MOUTH  DAILY 90 tablet 3  . aspirin (BAYER LOW STRENGTH) 81 MG EC tablet Take 81 mg by mouth daily.      . Blood Glucose Monitoring Suppl (ACCU-CHEK AVIVA PLUS) w/Device KIT Use as directed 1 kit 0  . carvedilol (COREG) 3.125 MG tablet TAKE 1/2 TABLETS BY MOUTH 2 TIMES DAILY WITH A MEAL. 180 tablet 3  . cycloSPORINE (RESTASIS) 0.05 % ophthalmic emulsion Place 1 drop into both eyes 2 (two) times daily.      . fluticasone (FLONASE) 50 MCG/ACT nasal spray Place 1 spray into the nose daily as needed for allergies.     . folic acid (FOLVITE) 1 MG tablet Take 2 mg by mouth 2 (two) times daily. 4 tabs daily    . furosemide (LASIX) 20 MG tablet TAKE 1 TABLET BY MOUTH  DAILY 90 tablet 3  . Insulin Infusion Pump Supplies (ACCU-CHEK PLASTIC CARTRIDGE) MISC USE AS DIRECTED PER INSULIN PUMP 30 each 3  . Insulin Infusion Pump Supplies (ULTRAFLEX) MISC CHANGE AS DIRECTED 30 each 3  . insulin lispro (HUMALOG) 100 UNIT/ML injection For use in pump, for a total of 110 units per day 110 mL 3  . methocarbamol (ROBAXIN) 500 MG tablet Take 1 tablet (500 mg total) by mouth every 8 (eight) hours as needed for muscle spasms. 90 tablet 1  . mometasone  (ELOCON) 0.1 % lotion APPLY TOPICALLY DAILY 60 mL 11  . Multiple Vitamins-Minerals (CENTRUM SILVER PO) Take 1 tablet by mouth daily.      . Omega-3 Fatty Acids (FISH OIL) 1200 MG CAPS Take 1,200 mg by mouth daily.     Marland Kitchen omeprazole (PRILOSEC) 20 MG capsule TAKE 1 CAPSULE BY MOUTH  DAILY AS NEEDED 90 capsule 3  . rosuvastatin (CRESTOR) 40 MG tablet TAKE 1 TABLET BY MOUTH AT  BEDTIME 90 tablet 3  . vitamin B-12 (CYANOCOBALAMIN) 1000 MCG tablet Take 1,000 mcg by mouth daily.    Marland Kitchen amitriptyline (ELAVIL) 10 MG tablet Take 1 tablet (10 mg total) by mouth at bedtime. (Patient not taking: Reported on 05/01/2017) 30 tablet 1  . diclofenac sodium (VOLTAREN) 1 % GEL  Apply 2 g topically 4 (four) times daily. (Patient not taking: Reported on 05/01/2017) 1 Tube 1  . DULoxetine (CYMBALTA) 60 MG capsule TAKE 1 CAPSULE BY MOUTH  DAILY (Patient not taking: Reported on 05/01/2017) 30 capsule 0   No current facility-administered medications on file prior to visit.     Allergies  Allergen Reactions  . Atorvastatin Other (See Comments)    unknown  . Niacin     REACTION: Severe heartburn  . Pioglitazone     REACTION: Edema    Family History  Problem Relation Age of Onset  . Colon cancer Brother 58  . Colon polyps Brother   . Cancer Brother        lung cancer, deceased  . Other Mother        MVA, deceased 34s  . Healthy Daughter   . Healthy Son   . Rectal cancer Neg Hx   . Stomach cancer Neg Hx     BP 132/84   Pulse 72   Ht 6' (1.829 m)   Wt 272 lb (123.4 kg)   SpO2 97%   BMI 36.89 kg/m  REVIEW OF SYSTEMS: She denies hypoglycemia PHYSICAL EXAMINATION: VITAL SIGNS:  See vs page GENERAL: no distress Pulses: dorsalis pedis intact bilat.   MSK: no deformity of the feet.   CV: trace bilat leg edema.  Skin:  no ulcer on the feet, but the skin is dry.  normal color and temp on the feet. Neuro: sensation is intact to touch on the feet, but decreased from normal LAB/XRAY RESULTS: Meter is  downloaded today, and the printout is scanned into the record.  It varies from 100-375.  It is in general higher as the day goes on IMPRESSION: Insulin-requiring type 2 DM, with renal insuff: worse.   PLAN:  Please take these settings:  basal rate of 2 units/hr, 24 hrs per day.   mealtime bolus of 1 unit/4 grams carbohydrate.  However, subtract 15 units from your calculated lunch bolus.  continue correction bolus (which some people call "sensitivity," or "insulin sensitivity ratio," or just "isr") of 1 unit for each 10 by which your glucose exceeds.  100

## 2017-05-06 ENCOUNTER — Other Ambulatory Visit: Payer: Self-pay | Admitting: Endocrinology

## 2017-05-24 ENCOUNTER — Other Ambulatory Visit: Payer: Self-pay | Admitting: Endocrinology

## 2017-06-05 ENCOUNTER — Encounter: Payer: Self-pay | Admitting: Endocrinology

## 2017-06-05 LAB — HM DIABETES EYE EXAM

## 2017-06-20 ENCOUNTER — Encounter: Payer: Medicare Other | Attending: Physical Medicine & Rehabilitation | Admitting: Physical Medicine & Rehabilitation

## 2017-06-20 ENCOUNTER — Encounter: Payer: Self-pay | Admitting: Physical Medicine & Rehabilitation

## 2017-06-20 VITALS — BP 122/69 | HR 77

## 2017-06-20 DIAGNOSIS — G479 Sleep disorder, unspecified: Secondary | ICD-10-CM | POA: Diagnosis not present

## 2017-06-20 DIAGNOSIS — M542 Cervicalgia: Secondary | ICD-10-CM | POA: Insufficient documentation

## 2017-06-20 DIAGNOSIS — M1990 Unspecified osteoarthritis, unspecified site: Secondary | ICD-10-CM | POA: Insufficient documentation

## 2017-06-20 DIAGNOSIS — N189 Chronic kidney disease, unspecified: Secondary | ICD-10-CM | POA: Insufficient documentation

## 2017-06-20 DIAGNOSIS — M461 Sacroiliitis, not elsewhere classified: Secondary | ICD-10-CM

## 2017-06-20 DIAGNOSIS — M109 Gout, unspecified: Secondary | ICD-10-CM | POA: Diagnosis not present

## 2017-06-20 DIAGNOSIS — E1142 Type 2 diabetes mellitus with diabetic polyneuropathy: Secondary | ICD-10-CM

## 2017-06-20 DIAGNOSIS — E1022 Type 1 diabetes mellitus with diabetic chronic kidney disease: Secondary | ICD-10-CM | POA: Diagnosis not present

## 2017-06-20 DIAGNOSIS — E1042 Type 1 diabetes mellitus with diabetic polyneuropathy: Secondary | ICD-10-CM | POA: Insufficient documentation

## 2017-06-20 DIAGNOSIS — M791 Myalgia, unspecified site: Secondary | ICD-10-CM

## 2017-06-20 DIAGNOSIS — G8929 Other chronic pain: Secondary | ICD-10-CM | POA: Insufficient documentation

## 2017-06-20 DIAGNOSIS — Z87891 Personal history of nicotine dependence: Secondary | ICD-10-CM | POA: Insufficient documentation

## 2017-06-20 DIAGNOSIS — I129 Hypertensive chronic kidney disease with stage 1 through stage 4 chronic kidney disease, or unspecified chronic kidney disease: Secondary | ICD-10-CM | POA: Diagnosis not present

## 2017-06-20 DIAGNOSIS — E785 Hyperlipidemia, unspecified: Secondary | ICD-10-CM | POA: Insufficient documentation

## 2017-06-20 DIAGNOSIS — K219 Gastro-esophageal reflux disease without esophagitis: Secondary | ICD-10-CM | POA: Diagnosis not present

## 2017-06-20 DIAGNOSIS — M545 Low back pain: Secondary | ICD-10-CM | POA: Insufficient documentation

## 2017-06-20 DIAGNOSIS — G4733 Obstructive sleep apnea (adult) (pediatric): Secondary | ICD-10-CM | POA: Insufficient documentation

## 2017-06-20 NOTE — Progress Notes (Signed)
Subjective:    Patient ID: [name], male    DOB: 1940/12/14, 76 y.o.   MRN: [id number]  HPI  76 y/o male with pmh of OSA, CKD, HTN, DM type 1 with peripheral neuropathy presents for follow up of low back pain > neck pain.   On first visit, pt complained of b/l L>R back pain.  Started ~>20 years ago.  No inciting event.  Sitting/laying improve the pain.  Standing exacerbates the pain.  Sharp, burning pain.  Radiates to left posterior thigh.  Intermittent.  He has associated numbness/tingling.  He only tried ASA, which helps some.  Pain limits from doing ADLs and fishing.  He denies falls.  Last clinic visit  04/19/17.  He continues with pool therapy 1/week and doing HEP.  He is using lidoderm and TENS with benefit.  He states he could not tolerate the Elavil.  He states has pain has improved.   Pain Inventory Average Pain 3 Pain Right Now 3 My pain is constant, sharp, burning and tingling  In the last 24 hours, has pain interfered with the following? General activity 4 Relation with others 1 Enjoyment of life 5 What TIME of day is your pain at its worst? morning Sleep (in general) Good  Pain is worse with: walking and standing Pain improves with: heat/ice, TENS and injections Relief from Meds: no selection  Mobility walk without assistance use a cane ability to climb steps?  yes do you drive?  yes  Function retired  Neuro/Psych weakness numbness tingling trouble walking  Prior Studies Any changes since last visit?  no  Physicians involved in your care Any changes since last visit?  no   Family History  Problem Relation Age of Onset  . Colon cancer Brother 65  . Colon polyps Brother   . Cancer Brother        lung cancer, deceased  . Other Mother        MVA, deceased 87s  . Healthy Daughter   . Healthy Son   . Rectal cancer Neg Hx   . Stomach cancer Neg Hx    Social History   Social History  . Marital status: Married    Spouse name: N/A  . Number of  children: N/A  . Years of education: N/A   Occupational History  .  [employer]   Social History Main Topics  . Smoking status: Former Smoker    Packs/day: 2.00    Years: 31.00    Types: Cigarettes    Quit date: 10/23/1982  . Smokeless tobacco: Never Used  . Alcohol use No  . Drug use: No  . Sexual activity: Not Asked   Other Topics Concern  . None   Social History Narrative   Lives with wife in a one story home.  Has a son and a daughter.     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     Education: 2 years of college.      Past Surgical History:  Procedure Laterality Date  . Abdominal US  09/1997  . arm fracture Left 1958   with hardware  . Colon cancer screening    . COLONOSCOPY  08/18/2004   diverticulitis  . COLONOSCOPY  09/24/2009  . ELECTROCARDIOGRAM  02/2006  . FLEXIBLE SIGMOIDOSCOPY  03/04/2001   polyps, anal fissure  . GANGLION CYST EXCISION Left 1980  . Lower Arterial  04/13/2004  . POLYPECTOMY    . Rest Cardiolite  03/19/2003  Past Medical History:  Diagnosis Date  . Allergy   . Anal fissure   . Anemia, unspecified   . Arthritis    Spinal OA  . BACK PAIN, LUMBAR 10/23/2007  . CARDIAC MURMUR, SYSTOLIC 10/29/4942  . DIABETES MELLITUS, TYPE I 05/20/2007  . DIASTOLIC DYSFUNCTION 9/67/5916  . 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[place])   . Duodenitis   . Edema 05/12/2008  . Esophageal stricture   . GERD (gastroesophageal reflux disease)   . 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 Gynecomastia   . Hepatic steatosis   . HYPERLIPIDEMIA 05/20/2007  . Hypertension   . Hypogonadism male   . Morbid obesity ([place]) 09/10/2009  . OBSTRUCTIVE SLEEP APNEA 11/04/2007  . Personal history of colonic polyps   . RENAL INSUFFICIENCY 05/12/2008  . Sleep apnea    uses cpap  . Urolithiasis    BP 122/69   Pulse 77   SpO2 97%   Opioid Risk Score:   Fall Risk Score:  `1  Depression screen PHQ 2/9  Depression screen PHQ 2/9 08/31/2016  Decreased Interest 0    Down, Depressed, Hopeless 0  PHQ - 2 Score 0  Altered sleeping 0  Tired, decreased energy 1  Change in appetite 0  Feeling bad or failure about yourself  0  Trouble concentrating 0  Moving slowly or fidgety/restless 0  Suicidal thoughts 0  PHQ-9 Score 1  Difficult doing work/chores Somewhat difficult    Review of Systems  HENT: Negative.   Eyes: Negative.   Respiratory: Negative.   Cardiovascular: Negative.   Gastrointestinal: Negative.   Endocrine:       Diabetic High blood sugar  Genitourinary: Positive for difficulty urinating.          Musculoskeletal: Positive for arthralgias, back pain, myalgias and neck pain.  Skin: Negative.   Allergic/Immunologic: Negative.   Neurological: Positive for weakness and numbness.       Tingling  Hematological: Negative.   All other systems reviewed and are negative.     Objective:   Physical Exam Gen: NAD. Vital signs reviewed HENT: Normocephalic, Atraumatic Eyes: EOMI. No discharge.  Cardio: RRR. No JVD. Pulm: B/l clear to auscultation.  Effort normal Abd: Soft, BS+ MSK:  Gait WNL.   + Mild TTP right iliac crest  No edema.   ROM WNL Neuro:   Strength  5/5 in all LE myotomes Skin: Warm and Dry. Intact.     Assessment & Plan:  76 y/o male with pmh of OSA, CKD, HTN, DM type 1 with peripheral neuropathy presents for follow up of low back pain > neck pain.    1. Chronic mechanical low back pain  MRIs C,L-spine early 2016 suggesting multilevel spondylosis with mild b/l multilevel foraminal narrowing C4-C7 and shallow disc bulge at L5-S1 without central canal or foraminal stenosis.  Avoid NSAIDs due to CKD  Unable to tolerate Gabapentin, Lyrica  Voltaren gel denied by insurance   Cont HEP  Cont TENs  Cont aquatic therapy  Cont tylenol  Cont Robaxin 500 TID PRN, usually taking qhs, pain improved with adjusted timing  Cont Cymbalta 60mg   Cont back brace during periods of excessive activity, reminded to avoid prolonged  use  Cont Lidoderm OTC  2. Sacroiliitis  See #1  Cont brace  Not main pain generator at present  3. Sleep disturbance  Cont new mattress  Cont CPAP  Cont Elavil 10qhs  4. Myalgia  Good benefit with trigger point injections  No need to repeat at  present  5. Diabetic neuropathy  See #1  6. Morbid obesity  Not interested in seeing dietitian at this time

## 2017-07-03 ENCOUNTER — Ambulatory Visit (INDEPENDENT_AMBULATORY_CARE_PROVIDER_SITE_OTHER): Payer: Medicare Other | Admitting: Endocrinology

## 2017-07-03 ENCOUNTER — Encounter: Payer: Self-pay | Admitting: Endocrinology

## 2017-07-03 VITALS — BP 144/76 | HR 87 | Wt 271.0 lb

## 2017-07-03 DIAGNOSIS — E0800 Diabetes mellitus due to underlying condition with hyperosmolarity without nonketotic hyperglycemic-hyperosmolar coma (NKHHC): Secondary | ICD-10-CM | POA: Diagnosis not present

## 2017-07-03 DIAGNOSIS — Z0000 Encounter for general adult medical examination without abnormal findings: Secondary | ICD-10-CM | POA: Diagnosis not present

## 2017-07-03 DIAGNOSIS — Z23 Encounter for immunization: Secondary | ICD-10-CM

## 2017-07-03 LAB — POCT GLYCOSYLATED HEMOGLOBIN (HGB A1C): Hemoglobin A1C: 7.9

## 2017-07-03 MED ORDER — CEPHALEXIN 500 MG PO CAPS: 500.0000 mg | ORAL_CAPSULE | Freq: Three times a day (TID) | ORAL | 0 refills | Status: DC

## 2017-07-03 NOTE — Progress Notes (Signed)
we discussed code status.  pt requests full code, but would not want to be started or maintained on artificial life-support measures if there was not a reasonable chance of recovery 

## 2017-07-03 NOTE — Progress Notes (Signed)
Subjective:    Patient ID: [name], male    DOB: Mar 15, 1941, 76 y.o.   MRN: [id number]  HPI Pt returns for f/u of diabetes mellitus: DM type: Insulin-requiring type 2 Dx'ed: 4917 Complications: polyneuropathy and renal insufficiency.  Therapy: insulin since 1995.  DKA: never.  Severe hypoglycemia: never.  Pancreatitis: never.  Other: he started pump therapy in mid-2015; he declines continuous glucose monitor.  Interval history: he takes these pump settings: basal rate of 2 units/hr, 24 hrs per day.   mealtime bolus of 1 unit/4 grams carbohydrate.  However, subtract 15 units from your calculated lunch bolus.  continue correction bolus (which some people call "sensitivity," or "insulin sensitivity ratio," or just "isr") of 1 unit for each 10 by which your glucose exceeds 100.  No recent steroids.  Meter is downloaded today, and the printout is scanned into the record.  cbg varies from 90-200's.  There is no trend throughout the day. Past Medical History:  Diagnosis Date  . Allergy   . Anal fissure   . Anemia, unspecified   . Arthritis    Spinal OA  . BACK PAIN, LUMBAR 10/23/2007  . CARDIAC MURMUR, SYSTOLIC 06/24/5055  . DIABETES MELLITUS, TYPE I 05/20/2007  . DIASTOLIC DYSFUNCTION 9/79/4801  . 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[place])   . Duodenitis   . Edema 05/12/2008  . Esophageal stricture   . GERD (gastroesophageal reflux disease)   . 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 Gynecomastia   . Hepatic steatosis   . HYPERLIPIDEMIA 05/20/2007  . Hypertension   . Hypogonadism male   . Morbid obesity ([place]) 09/10/2009  . OBSTRUCTIVE SLEEP APNEA 11/04/2007  . Personal history of colonic polyps   . RENAL INSUFFICIENCY 05/12/2008  . Sleep apnea    uses cpap  . Urolithiasis     Past Surgical History:  Procedure Laterality Date  . Abdominal US  09/1997  . arm fracture Left 1958   with hardware  . Colon cancer screening    . COLONOSCOPY  08/18/2004   diverticulitis  . COLONOSCOPY   09/24/2009  . ELECTROCARDIOGRAM  02/2006  . FLEXIBLE SIGMOIDOSCOPY  03/04/2001   polyps, anal fissure  . GANGLION CYST EXCISION Left 1980  . Lower Arterial  04/13/2004  . POLYPECTOMY    . Rest Cardiolite  03/19/2003    Social History   Social History  . Marital status: Married    Spouse name: N/A  . Number of children: N/A  . Years of education: N/A   Occupational History  .  [employer]   Social History Main Topics  . Smoking status: Former Smoker    Packs/day: 2.00    Years: 31.00    Types: Cigarettes    Quit date: 10/23/1982  . Smokeless tobacco: Never Used  . Alcohol use No  . Drug use: No  . Sexual activity: Not on file   Other Topics Concern  . Not on file   Social History Narrative   Lives with wife in a one story home.  Has a son and a daughter.     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     Education: 2 years of college.       Current Outpatient Prescriptions on File Prior to Visit  Medication Sig Dispense Refill  . ACCU-CHEK AVIVA PLUS test strip USE TO TEST BLOOD SUGAR 6  TIMES PER DAY. 600 each 2  . ACCU-CHEK FASTCLIX LANCETS MISC TEST 4 TIMES DAILY AS  DIRECTED 408 each 3  . alfuzosin (UROXATRAL) 10 MG 24 hr tablet Take 1 tablet (10 mg total) by mouth daily with breakfast. 90 tablet 2  . allopurinol (ZYLOPRIM) 300 MG tablet TAKE 1 TABLET BY MOUTH  DAILY 90 tablet 3  . amitriptyline (ELAVIL) 10 MG tablet Take 1 tablet (10 mg total) by mouth at bedtime. 30 tablet 1  . aspirin (BAYER LOW STRENGTH) 81 MG EC tablet Take 81 mg by mouth daily.      . Blood Glucose Monitoring Suppl (ACCU-CHEK AVIVA PLUS) w/Device KIT Use as directed 1 kit 0  . carvedilol (COREG) 3.125 MG tablet TAKE 1/2 TABLETS BY MOUTH 2 TIMES DAILY WITH A MEAL. 180 tablet 3  . cycloSPORINE (RESTASIS) 0.05 % ophthalmic emulsion Place 1 drop into both eyes 2 (two) times daily.      . diclofenac sodium (VOLTAREN) 1 % GEL Apply 2 g topically 4 (four) times daily. 1 Tube 1  .  DULoxetine (CYMBALTA) 60 MG capsule TAKE 1 CAPSULE BY MOUTH  DAILY 30 capsule 0  . fluticasone (FLONASE) 50 MCG/ACT nasal spray Place 1 spray into the nose daily as needed for allergies.     . folic acid (FOLVITE) 1 MG tablet Take 2 mg by mouth 2 (two) times daily. 4 tabs daily    . furosemide (LASIX) 20 MG tablet TAKE 1 TABLET BY MOUTH  DAILY 90 tablet 3  . Insulin Infusion Pump Supplies (ACCU-CHEK PLASTIC CARTRIDGE) MISC USE AS DIRECTED PER INSULIN PUMP 30 each 3  . Insulin Infusion Pump Supplies (ULTRAFLEX) MISC CHANGE AS DIRECTED 30 each 3  . insulin lispro (HUMALOG) 100 UNIT/ML injection For use in pump, for a total of 110 units per day 110 mL 3  . methocarbamol (ROBAXIN) 500 MG tablet Take 1 tablet (500 mg total) by mouth every 8 (eight) hours as needed for muscle spasms. 90 tablet 1  . mometasone (ELOCON) 0.1 % lotion APPLY TOPICALLY DAILY 60 mL 11  . Multiple Vitamins-Minerals (CENTRUM SILVER PO) Take 1 tablet by mouth daily.      . Omega-3 Fatty Acids (FISH OIL) 1200 MG CAPS Take 1,200 mg by mouth daily.     Marland Kitchen omeprazole (PRILOSEC) 20 MG capsule TAKE 1 CAPSULE BY MOUTH  DAILY AS NEEDED 90 capsule 3  . rosuvastatin (CRESTOR) 40 MG tablet TAKE 1 TABLET BY MOUTH AT  BEDTIME 90 tablet 2  . vitamin B-12 (CYANOCOBALAMIN) 1000 MCG tablet Take 1,000 mcg by mouth daily.     No current facility-administered medications on file prior to visit.     Allergies  Allergen Reactions  . Atorvastatin Other (See Comments)    unknown  . Niacin     REACTION: Severe heartburn  . Pioglitazone     REACTION: Edema    Family History  Problem Relation Age of Onset  . Colon cancer Brother 63  . Colon polyps Brother   . Cancer Brother        lung cancer, deceased  . Other Mother        MVA, deceased 73s  . Healthy Daughter   . Healthy Son   . Rectal cancer Neg Hx   . Stomach cancer Neg Hx     BP (!) 144/76   Pulse 87   Wt 271 lb (122.9 kg)   SpO2 97%   BMI 36.75 kg/m    Review of  Systems He denies hypoglycemia.  He has left earache, but no fever.     Objective:  Physical Exam VITAL SIGNS:  See vs page GENERAL: no distress Ears: left tm is slightly red Pulses: foot pulses are intact bilaterally.   MSK: no deformity of the feet or ankles.  CV: no edema of the legs or ankles Skin:  no ulcer on the feet or ankles.  normal color and temp on the feet and ankles Neuro: sensation is intact to touch on the feet and ankles.   Ext; ecchymosis of the right great toenail.   Lab Results  Component Value Date   CREATININE 1.51 (H) 05/01/2017   BUN 26 (H) 05/01/2017   NA 141 05/01/2017   K 3.9 05/01/2017   CL 103 05/01/2017   CO2 30 05/01/2017    A1c=7.9%    Assessment & Plan:  Insulin-requiring type 2 DM, with renal insuff.  Improved glycemic control.  Same pump settings.  AOM, new.  I rx'ed keflex.  Subjective:   Patient here for Medicare annual wellness visit and management of other chronic and acute problems.     Risk factors: advanced age    59 of Physicians Providing Medical Care to Patient:  See "snapshot"   Activities of Daily Living: In your present state of health, do you have any difficulty performing the following activities (lives with wife and brother)?:  Preparing food and eating?: No  Bathing yourself: No  Getting dressed: No  Using the toilet:No  Moving around from place to place: No  In the past year have you fallen or had a near fall?:No    Home Safety: Has smoke detector and wears seat belts. Firearms are safely stored. No excess sun exposure.  Diet and Exercise  Current exercise habits: pt says limited by arthralgias.  Dietary issues discussed: pt reports a healthy diet   Depression Screen  Q1: Over the past two weeks, have you felt down, depressed or hopeless? no  Q2: Over the past two weeks, have you felt little interest or pleasure in doing things? no   The following portions of the patient's history were reviewed and  updated as appropriate: allergies, current medications, past family history, past medical history, past social history, past surgical history and problem list.   Review of Systems  Denies hearing loss, and visual loss Objective:   Vision:  Advertising account executive, so he declines VA today.  Hearing: grossly normal Body mass index:  See vs page Msk: pt easily and quickly performs "get-up-and-go" from a sitting position Cognitive Impairment Assessment: cognition, memory and judgment appear normal.  remembers 2/3 at 5 minutes (? effort).  excellent recall.  can easily read and write a sentence.  alert and oriented x 3.    Assessment:   Medicare wellness utd on preventive parameters.     Plan:   During the course of the visit the patient was educated and counseled about appropriate screening and preventive services including:        Fall prevention is offered  Diabetes screening is UTD Nutrition counseling is offered  Vaccines are updated as needed  Patient Instructions (the written plan) was given to the patient.

## 2017-07-03 NOTE — Patient Instructions (Addendum)
I have sent a prescription to your pharmacy, for an antibiotic. Please take these pump settings:  basal rate of 2 units/hr, 24 hrs per day.   mealtime bolus of 1 unit/4 grams carbohydrate.  However, subtract 15 units from your calculated lunch bolus.  continue correction bolus (which some people call "sensitivity," or "insulin sensitivity ratio," or just "isr") of 1 unit for each 10 by which your glucose exceeds.  100.   Please consider these measures for your health:  minimize alcohol.  Do not use tobacco products.  Have a colonoscopy at least every 10 years from age 37.  Keep firearms safely stored.  Always use seat belts.  have working smoke alarms in your home.  See an eye doctor and dentist regularly.  Never drive under the influence of alcohol or drugs (including prescription drugs).  Those with fair skin should take precautions against the sun, and should carefully examine their skin once per month, for any new or changed moles.  It is critically important to prevent falling down (keep floor areas well-lit, dry, and free of loose objects.  If you have a cane, walker, or wheelchair, you should use it, even for short trips around the house.  Wear flat-soled shoes.  Also, try not to rush)  good diet and exercise significantly improve the control of your diabetes.  please let me know if you wish to be referred to a dietician.  high blood sugar is very risky to your health.  you should see an eye doctor and dentist every year.  It is very important to get all recommended vaccinations.  Please come back for a follow-up appointment in 3-4 months.

## 2017-07-05 ENCOUNTER — Other Ambulatory Visit: Payer: Self-pay | Admitting: Endocrinology

## 2017-07-05 ENCOUNTER — Other Ambulatory Visit: Payer: Self-pay | Admitting: Physical Medicine & Rehabilitation

## 2017-08-25 ENCOUNTER — Other Ambulatory Visit: Payer: Self-pay | Admitting: Endocrinology

## 2017-09-20 ENCOUNTER — Other Ambulatory Visit: Payer: Self-pay

## 2017-09-20 ENCOUNTER — Encounter: Payer: Self-pay | Admitting: Physical Medicine & Rehabilitation

## 2017-09-20 ENCOUNTER — Encounter: Payer: Medicare Other | Attending: Physical Medicine & Rehabilitation | Admitting: Physical Medicine & Rehabilitation

## 2017-09-20 VITALS — BP 146/73 | HR 76

## 2017-09-20 DIAGNOSIS — M461 Sacroiliitis, not elsewhere classified: Secondary | ICD-10-CM

## 2017-09-20 DIAGNOSIS — G8929 Other chronic pain: Secondary | ICD-10-CM

## 2017-09-20 DIAGNOSIS — M791 Myalgia, unspecified site: Secondary | ICD-10-CM

## 2017-09-20 DIAGNOSIS — E1042 Type 1 diabetes mellitus with diabetic polyneuropathy: Secondary | ICD-10-CM | POA: Insufficient documentation

## 2017-09-20 DIAGNOSIS — M545 Low back pain: Secondary | ICD-10-CM | POA: Diagnosis not present

## 2017-09-20 DIAGNOSIS — M542 Cervicalgia: Secondary | ICD-10-CM | POA: Diagnosis present

## 2017-09-20 DIAGNOSIS — G479 Sleep disorder, unspecified: Secondary | ICD-10-CM | POA: Diagnosis not present

## 2017-09-20 DIAGNOSIS — Z87891 Personal history of nicotine dependence: Secondary | ICD-10-CM | POA: Insufficient documentation

## 2017-09-20 DIAGNOSIS — I129 Hypertensive chronic kidney disease with stage 1 through stage 4 chronic kidney disease, or unspecified chronic kidney disease: Secondary | ICD-10-CM | POA: Insufficient documentation

## 2017-09-20 DIAGNOSIS — G4733 Obstructive sleep apnea (adult) (pediatric): Secondary | ICD-10-CM | POA: Diagnosis not present

## 2017-09-20 DIAGNOSIS — E1142 Type 2 diabetes mellitus with diabetic polyneuropathy: Secondary | ICD-10-CM | POA: Diagnosis not present

## 2017-09-20 MED ORDER — AMITRIPTYLINE HCL 10 MG PO TABS
10.0000 mg | ORAL_TABLET | Freq: Every day | ORAL | 2 refills | Status: DC
Start: 2017-09-20 — End: 2017-12-13

## 2017-09-20 NOTE — Progress Notes (Signed)
Subjective:    Patient ID: [name], male    DOB: 1941-09-03, 76 y.o.   MRN: [id number]  HPI  76 y/o male with pmh of OSA, CKD, HTN, DM type 1 with peripheral neuropathy presents for follow up of low back pain > neck pain.   On first visit, pt complained of b/l L>R back pain.  Started ~>20 years ago.  No inciting event.  Sitting/laying improve the pain.  Standing exacerbates the pain.  Sharp, burning pain.  Radiates to left posterior thigh.  Intermittent.  He has associated numbness/tingling.  He only tried ASA, which helps some.  Pain limits from doing ADLs and fishing.  He denies falls.  Last clinic visit  06/20/17.  Since last visit, he states he feels better. He states he is relatively stable.  He is only taking Robaxin and Cymbalta.   Pain Inventory Average Pain 4 Pain Right Now 4 My pain is constant, sharp, burning and tingling  In the last 24 hours, has pain interfered with the following? General activity 5 Relation with others 5 Enjoyment of life 6 What TIME of day is your pain at its worst? morning Sleep (in general) Good  Pain is worse with: walking, bending and standing Pain improves with: rest and TENS Relief from Meds: 4  Mobility walk without assistance use a cane ability to climb steps?  yes do you drive?  yes  Function retired  Neuro/Psych weakness numbness tingling trouble walking  Prior Studies Any changes since last visit?  no  Physicians involved in your care Any changes since last visit?  no   Family History  Problem Relation Age of Onset  . Colon cancer Brother 69  . Colon polyps Brother   . Cancer Brother        lung cancer, deceased  . Other Mother        MVA, deceased 42s  . Healthy Daughter   . Healthy Son   . Rectal cancer Neg Hx   . Stomach cancer Neg Hx    Social History   Socioeconomic History  . Marital status: Married    Spouse name: None  . Number of children: None  . Years of education: None  . Highest education  level: None  Social Needs  . Financial resource strain: None  . Food insecurity - worry: None  . Food insecurity - inability: None  . Transportation needs - medical: None  . Transportation needs - non-medical: None  Occupational History    Employer: [employer]  Tobacco Use  . Smoking status: Former Smoker    Packs/day: 2.00    Years: 31.00    Pack years: 62.00    Types: Cigarettes    Last attempt to quit: 10/23/1982    Years since quitting: 34.9  . Smokeless tobacco: Never Used  Substance and Sexual Activity  . Alcohol use: No    Alcohol/week: 0.0 oz  . Drug use: No  . Sexual activity: None  Other Topics Concern  . None  Social History Narrative   Lives with wife in a one story home.  Has a son and a daughter.     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     Education: 2 years of college.   Past Surgical History:  Procedure Laterality Date  . Abdominal US  09/1997  . arm fracture Left 1958   with hardware  . Colon cancer screening    . COLONOSCOPY  08/18/2004   diverticulitis  .  COLONOSCOPY  09/24/2009  . ELECTROCARDIOGRAM  02/2006  . FLEXIBLE SIGMOIDOSCOPY  03/04/2001   polyps, anal fissure  . GANGLION CYST EXCISION Left 1980  . Lower Arterial  04/13/2004  . POLYPECTOMY    . Rest Cardiolite  03/19/2003   Past Medical History:  Diagnosis Date  . Allergy   . Anal fissure   . Anemia, unspecified   . Arthritis    Spinal OA  . BACK PAIN, LUMBAR 10/23/2007  . CARDIAC MURMUR, SYSTOLIC 0/0/1749  . DIABETES MELLITUS, TYPE I 05/20/2007  . DIASTOLIC DYSFUNCTION 4/49/6759  . 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[place])   . Duodenitis   . Edema 05/12/2008  . Esophageal stricture   . GERD (gastroesophageal reflux disease)   . 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 Gynecomastia   . Hepatic steatosis   . HYPERLIPIDEMIA 05/20/2007  . Hypertension   . Hypogonadism male   . Morbid obesity ([place]) 09/10/2009  . OBSTRUCTIVE SLEEP APNEA 11/04/2007  . Personal history of  colonic polyps   . RENAL INSUFFICIENCY 05/12/2008  . Sleep apnea    uses cpap  . Urolithiasis    BP (!) 146/73   Pulse 76   SpO2 94%   Opioid Risk Score:   Fall Risk Score:  `1  Depression screen PHQ 2/9  Depression screen [facility] 2/9 09/20/2017 08/31/2016  Decreased Interest 0 0  Down, Depressed, Hopeless 0 0  PHQ - 2 Score 0 0  Altered sleeping - 0  Tired, decreased energy - 1  Change in appetite - 0  Feeling bad or failure about yourself  - 0  Trouble concentrating - 0  Moving slowly or fidgety/restless - 0  Suicidal thoughts - 0  PHQ-9 Score - 1  Difficult doing work/chores - Somewhat difficult    Review of Systems  HENT: Negative.   Eyes: Negative.   Respiratory: Positive for apnea.   Cardiovascular: Positive for leg swelling.  Gastrointestinal: Negative.   Endocrine:       Diabetic High blood sugar  Genitourinary: Positive for difficulty urinating.          Musculoskeletal: Positive for arthralgias, back pain, myalgias and neck pain.  Skin: Negative.   Allergic/Immunologic: Negative.   Neurological: Positive for weakness and numbness.       Tingling  Hematological: Negative.   All other systems reviewed and are negative.     Objective:   Physical Exam Gen: NAD. Vital signs reviewed HENT: Normocephalic, Atraumatic Eyes: EOMI. No discharge.  Cardio: RRR. No JVD. Pulm: B/l clear to auscultation.  Effort normal Abd: Soft, BS+ MSK:  Gait WNL.   +Mild TTP right iliac crest  No edema.   ROM WNL Neuro:   Strength  5/5 in all LE myotomes Skin: Warm and Dry. Intact.     Assessment & Plan:  76 y/o male with pmh of OSA, CKD, HTN, DM type 1 with peripheral neuropathy presents for follow up of low back pain > neck pain.    1. Chronic mechanical low back pain  MRIs C,L-spine early 2016 suggesting multilevel spondylosis with mild b/l multilevel foraminal narrowing C4-C7 and shallow disc bulge at L5-S1 without central canal or foraminal stenosis.  Avoid NSAIDs  due to CKD  Unable to tolerate Gabapentin, Lyrica  Voltaren gel denied by insurance   Cont HEP  Cont TENs  Cont aquatic therapy  Cont tylenol  Cont Robaxin 500 TID PRN, only needing 1-2/week   Cont Cymbalta 60mg   Cont back brace during periods of excessive  activity, reminded to avoid prolonged use  Cont Lidoderm OTC  2. Sacroiliitis  See #1  Cont brace  Not main pain generator at present  3. Sleep disturbance  Recently new mattress  Cont CPAP  Cont Elavil 10qhs  4. Myalgia  Good benefit with trigger point injections  No need to repeat at present  5. Diabetic neuropathy  See #1  6. Morbid obesity  Not interested in seeing dietitian at this time

## 2017-09-28 ENCOUNTER — Other Ambulatory Visit: Payer: Self-pay | Admitting: Endocrinology

## 2017-10-02 ENCOUNTER — Ambulatory Visit: Payer: Medicare Other | Admitting: Endocrinology

## 2017-10-08 ENCOUNTER — Encounter: Payer: Self-pay | Admitting: Endocrinology

## 2017-10-08 ENCOUNTER — Ambulatory Visit: Payer: Medicare Other | Admitting: Endocrinology

## 2017-10-08 ENCOUNTER — Ambulatory Visit: Payer: Medicare Other | Admitting: Internal Medicine

## 2017-10-08 VITALS — BP 130/80 | HR 76 | Ht 71.0 in | Wt 273.0 lb

## 2017-10-08 DIAGNOSIS — E0800 Diabetes mellitus due to underlying condition with hyperosmolarity without nonketotic hyperglycemic-hyperosmolar coma (NKHHC): Secondary | ICD-10-CM

## 2017-10-08 LAB — POCT GLYCOSYLATED HEMOGLOBIN (HGB A1C): HEMOGLOBIN A1C: 7.9

## 2017-10-08 NOTE — Patient Instructions (Addendum)
Please take these pump settings:  basal rate of 2 units/hr, 24 hrs per day.   mealtime bolus of 1 unit/4 grams carbohydrate.  However, subtract 15 units from your calculated lunch bolus.  continue correction bolus (which some people call "sensitivity," or "insulin sensitivity ratio," or just "isr") of 1 unit for each 10 by which your glucose exceeds 100.    Please come back for a follow-up appointment in 3-4 months.

## 2017-10-08 NOTE — Progress Notes (Signed)
Subjective:    Patient ID: [name], male    DOB: 12-Feb-1941, 76 y.o.   MRN: [id number]  HPI Pt returns for f/u of diabetes mellitus: DM type: Insulin-requiring type 2 Dx'ed: 3291 Complications: polyneuropathy and renal insufficiency.  Therapy: insulin since 1995.  DKA: never.  Severe hypoglycemia: never.  Pancreatitis: never.  Other: he started pump therapy in mid-2015; he declines continuous glucose monitor.  Interval history: he takes these pump settings: basal rate of 2 units/hr, 24 hrs per day.   mealtime bolus of 1 unit/4 grams carbohydrate.  However, subtract 15 units from your calculated lunch bolus.  continue correction bolus (which some people call "sensitivity," or "insulin sensitivity ratio," or just "isr") of 1 unit for each 10 by which your glucose exceeds 100.  Total daily insulin dosage: approx 110 units No recent steroids.  Meter is downloaded today, and the printout is scanned into the record.  There are no data.  He says cbg's vary from 90-300.  There is no trend throughout the day.  Past Medical History:  Diagnosis Date  . Allergy   . Anal fissure   . Anemia, unspecified   . Arthritis    Spinal OA  . BACK PAIN, LUMBAR 10/23/2007  . CARDIAC MURMUR, SYSTOLIC 06/23/6605  . DIABETES MELLITUS, TYPE I 05/20/2007  . DIASTOLIC DYSFUNCTION 0/01/5996  . 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[place])   . Duodenitis   . Edema 05/12/2008  . Esophageal stricture   . GERD (gastroesophageal reflux disease)   . 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 Gynecomastia   . Hepatic steatosis   . HYPERLIPIDEMIA 05/20/2007  . Hypertension   . Hypogonadism male   . Morbid obesity ([place]) 09/10/2009  . OBSTRUCTIVE SLEEP APNEA 11/04/2007  . Personal history of colonic polyps   . RENAL INSUFFICIENCY 05/12/2008  . Sleep apnea    uses cpap  . Urolithiasis     Past Surgical History:  Procedure Laterality Date  . Abdominal US  09/1997  . arm fracture Left 1958   with hardware  . Colon cancer  screening    . COLONOSCOPY  08/18/2004   diverticulitis  . COLONOSCOPY  09/24/2009  . ELECTROCARDIOGRAM  02/2006  . FLEXIBLE SIGMOIDOSCOPY  03/04/2001   polyps, anal fissure  . GANGLION CYST EXCISION Left 1980  . Lower Arterial  04/13/2004  . POLYPECTOMY    . Rest Cardiolite  03/19/2003    Social History   Socioeconomic History  . Marital status: Married    Spouse name: Not on file  . Number of children: Not on file  . Years of education: Not on file  . Highest education level: Not on file  Social Needs  . Financial resource strain: Not on file  . Food insecurity - worry: Not on file  . Food insecurity - inability: Not on file  . Transportation needs - medical: Not on file  . Transportation needs - non-medical: Not on file  Occupational History    Employer: [employer]  Tobacco Use  . Smoking status: Former Smoker    Packs/day: 2.00    Years: 31.00    Pack years: 62.00    Types: Cigarettes    Last attempt to quit: 10/23/1982    Years since quitting: 34.9  . Smokeless tobacco: Never Used  Substance and Sexual Activity  . Alcohol use: No    Alcohol/week: 0.0 oz  . Drug use: No  . Sexual activity: Not on file  Other Topics Concern  .  Not on file  Social History Narrative   Lives with wife in a one story home.  Has a son and a daughter.     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     Education: 2 years of college.    Current Outpatient Medications on File Prior to Visit  Medication Sig Dispense Refill  . ACCU-CHEK AVIVA PLUS test strip USE TO TEST BLOOD SUGAR 6  TIMES PER DAY. 600 each 2  . ACCU-CHEK FASTCLIX LANCETS MISC TEST 4 TIMES DAILY AS  DIRECTED 408 each 3  . alfuzosin (UROXATRAL) 10 MG 24 hr tablet TAKE 1 TABLET BY MOUTH  DAILY WITH BREAKFAST 90 tablet 2  . allopurinol (ZYLOPRIM) 300 MG tablet TAKE 1 TABLET BY MOUTH  DAILY 90 tablet 3  . aspirin (BAYER LOW STRENGTH) 81 MG EC tablet Take 81 mg by mouth daily.      . Blood Glucose Monitoring  Suppl (ACCU-CHEK AVIVA PLUS) w/Device KIT Use as directed 1 kit 0  . carvedilol (COREG) 3.125 MG tablet TAKE 1/2 TABLETS BY MOUTH 2 TIMES DAILY WITH A MEAL. 180 tablet 3  . cycloSPORINE (RESTASIS) 0.05 % ophthalmic emulsion Place 1 drop into both eyes 2 (two) times daily.      . diclofenac sodium (VOLTAREN) 1 % GEL Apply 2 g topically 4 (four) times daily. 1 Tube 1  . DULoxetine (CYMBALTA) 60 MG capsule TAKE 1 CAPSULE BY MOUTH  DAILY 30 capsule 0  . fluticasone (FLONASE) 50 MCG/ACT nasal spray Place 1 spray into the nose daily as needed for allergies.     . folic acid (FOLVITE) 1 MG tablet Take 2 mg by mouth 2 (two) times daily. 4 tabs daily    . furosemide (LASIX) 20 MG tablet TAKE 1 TABLET BY MOUTH  DAILY 90 tablet 3  . Insulin Infusion Pump Supplies (ACCU-CHEK PLASTIC CARTRIDGE) MISC USE AS DIRECTED PER INSULIN PUMP 30 each 3  . Insulin Infusion Pump Supplies (ULTRAFLEX) MISC CHANGE AS DIRECTED 30 each 6  . insulin lispro (HUMALOG) 100 UNIT/ML injection For use in pump, for a total of 110 units per day 110 mL 3  . methocarbamol (ROBAXIN) 500 MG tablet Take 1 tablet (500 mg total) by mouth every 8 (eight) hours as needed for muscle spasms. 90 tablet 1  . mometasone (ELOCON) 0.1 % lotion APPLY TOPICALLY DAILY 60 mL 11  . Multiple Vitamins-Minerals (CENTRUM SILVER PO) Take 1 tablet by mouth daily.      . Omega-3 Fatty Acids (FISH OIL) 1200 MG CAPS Take 1,200 mg by mouth daily.     Marland Kitchen omeprazole (PRILOSEC) 20 MG capsule TAKE 1 CAPSULE BY MOUTH  DAILY AS NEEDED 90 capsule 3  . rosuvastatin (CRESTOR) 40 MG tablet TAKE 1 TABLET BY MOUTH AT  BEDTIME 90 tablet 2  . vitamin B-12 (CYANOCOBALAMIN) 1000 MCG tablet Take 1,000 mcg by mouth daily.    Marland Kitchen amitriptyline (ELAVIL) 10 MG tablet Take 1 tablet (10 mg total) by mouth at bedtime. (Patient not taking: Reported on 10/08/2017) 30 tablet 2   No current facility-administered medications on file prior to visit.     Allergies  Allergen Reactions  .  Atorvastatin Other (See Comments)    unknown  . Niacin     REACTION: Severe heartburn  . Pioglitazone     REACTION: Edema    Family History  Problem Relation Age of Onset  . Colon cancer Brother 33  . Colon polyps Brother   . Cancer  Brother        lung cancer, deceased  . Other Mother        MVA, deceased 63s  . Healthy Daughter   . Healthy Son   . Rectal cancer Neg Hx   . Stomach cancer Neg Hx     BP 130/80   Pulse 76   Ht '5\' 11"'  (1.803 m)   Wt 273 lb (123.8 kg)   SpO2 97%   BMI 38.08 kg/m    Review of Systems He denies LOC.     Objective:   Physical Exam VITAL SIGNS:  See vs page.  GENERAL: no distress.   Pulses: foot pulses are intact bilaterally.   MSK: no deformity of the feet or ankles.  CV: no edema of the legs or ankles.  Skin:  no ulcer on the feet or ankles.  normal color and temp on the feet and ankles.  Neuro: sensation is intact to touch on the feet and ankles.   Ext: ecchymosis of the right great toenail.    A1c=7.9%    Assessment & Plan:  Insulin-requiring type 2 DM, with renal insuff: therapy limited by variable cbg's.  Patient Instructions  Please take these pump settings:  basal rate of 2 units/hr, 24 hrs per day.   mealtime bolus of 1 unit/4 grams carbohydrate.  However, subtract 15 units from your calculated lunch bolus.  continue correction bolus (which some people call "sensitivity," or "insulin sensitivity ratio," or just "isr") of 1 unit for each 10 by which your glucose exceeds 100.    Please come back for a follow-up appointment in 3-4 months.

## 2017-10-09 ENCOUNTER — Other Ambulatory Visit: Payer: Self-pay

## 2017-10-09 MED ORDER — ACCU-CHEK PLASTIC CARTRIDGE MISC: 3 refills | Status: DC

## 2017-10-10 ENCOUNTER — Encounter: Payer: Medicare Other | Attending: Endocrinology | Admitting: Nutrition

## 2017-10-10 ENCOUNTER — Telehealth: Payer: Self-pay | Admitting: Endocrinology

## 2017-10-10 DIAGNOSIS — Z794 Long term (current) use of insulin: Secondary | ICD-10-CM

## 2017-10-10 DIAGNOSIS — Z713 Dietary counseling and surveillance: Secondary | ICD-10-CM | POA: Insufficient documentation

## 2017-10-10 DIAGNOSIS — E0800 Diabetes mellitus due to underlying condition with hyperosmolarity without nonketotic hyperglycemic-hyperosmolar coma (NKHHC): Secondary | ICD-10-CM | POA: Diagnosis not present

## 2017-10-10 DIAGNOSIS — E119 Type 2 diabetes mellitus without complications: Secondary | ICD-10-CM

## 2017-10-10 NOTE — Patient Instructions (Signed)
Call readings in one week. Call before then if readings drop below 80.

## 2017-10-10 NOTE — Telephone Encounter (Signed)
These changes are fine, thanks

## 2017-10-10 NOTE — Progress Notes (Signed)
Pt. Said he was not able to do the changes on his pump that Dr. Loanne Drilling told him to change.  He was told to change the I/C ratio from 5 to 4.   He was looking on the pump to make the changes, but he needed to change the settings oh his meter remote.  When I checked the settings, the I/C ratio was already at 4 for all meals.   He requested to change this setting to 3.  He says his readings are all "hi".  Being between med 200s to low 300s, except before supper, and they are around 140s to 150s.  No change was made to the lunch setting, and was kept at 4.   He will call in one week with blood sugar readings, or before if readings drop below 80.

## 2017-10-22 ENCOUNTER — Other Ambulatory Visit: Payer: Self-pay | Admitting: Endocrinology

## 2017-10-24 NOTE — Telephone Encounter (Signed)
°  Pt needs script sent over to pharmacy  [place], [place] states we sent over the script when he done it via [facility].    carvedilol (COREG) 3.125 MG tablet

## 2017-10-27 ENCOUNTER — Encounter: Payer: Self-pay | Admitting: Endocrinology

## 2017-11-01 ENCOUNTER — Other Ambulatory Visit: Payer: Self-pay

## 2017-11-01 MED ORDER — ACCU-CHEK PLASTIC CARTRIDGE MISC: 3 refills | Status: DC

## 2017-11-20 ENCOUNTER — Other Ambulatory Visit: Payer: Self-pay

## 2017-11-20 MED ORDER — ACCU-CHEK PLASTIC CARTRIDGE MISC: 3 refills | Status: DC

## 2017-11-20 MED ORDER — INSULIN LISPRO 100 UNIT/ML ~~LOC~~ SOLN: SUBCUTANEOUS | 3 refills | Status: DC

## 2017-11-25 ENCOUNTER — Encounter: Payer: Self-pay | Admitting: Endocrinology

## 2017-11-27 ENCOUNTER — Other Ambulatory Visit: Payer: Self-pay

## 2017-11-27 MED ORDER — ALFUZOSIN HCL ER 10 MG PO TB24: 10.0000 mg | ORAL_TABLET | Freq: Every day | ORAL | 2 refills | Status: DC

## 2017-11-28 ENCOUNTER — Other Ambulatory Visit: Payer: Self-pay

## 2017-11-28 MED ORDER — ALFUZOSIN HCL ER 10 MG PO TB24: 10.0000 mg | ORAL_TABLET | Freq: Every day | ORAL | 2 refills | Status: DC

## 2017-12-03 ENCOUNTER — Other Ambulatory Visit: Payer: Self-pay | Admitting: Endocrinology

## 2017-12-13 ENCOUNTER — Other Ambulatory Visit: Payer: Self-pay | Admitting: Physical Medicine & Rehabilitation

## 2017-12-13 ENCOUNTER — Other Ambulatory Visit: Payer: Self-pay | Admitting: Endocrinology

## 2017-12-20 ENCOUNTER — Ambulatory Visit: Payer: Medicare Other | Admitting: Physical Medicine & Rehabilitation

## 2018-01-07 ENCOUNTER — Encounter: Payer: Self-pay | Admitting: Endocrinology

## 2018-01-07 ENCOUNTER — Ambulatory Visit: Payer: Medicare Other | Admitting: Endocrinology

## 2018-01-07 VITALS — BP 140/80 | HR 82 | Ht 71.0 in | Wt 274.0 lb

## 2018-01-07 DIAGNOSIS — E0800 Diabetes mellitus due to underlying condition with hyperosmolarity without nonketotic hyperglycemic-hyperosmolar coma (NKHHC): Secondary | ICD-10-CM | POA: Diagnosis not present

## 2018-01-07 LAB — HEMOGLOBIN A1C: Hgb A1c MFr Bld: 7.7 % — ABNORMAL HIGH (ref 4.6–6.5)

## 2018-01-07 NOTE — Progress Notes (Signed)
Subjective:    Patient ID: [name], male    DOB: 12-24-1940, 77 y.o.   MRN: [id number]  HPI Pt returns for f/u of diabetes mellitus: DM type: Insulin-requiring type 2 Dx'ed: 0712 Complications: polyneuropathy and renal insufficiency.  Therapy: insulin since 1995.  DKA: never.  Severe hypoglycemia: never.  Pancreatitis: never.  Other: he started pump therapy in mid-2015; he declines continuous glucose monitor.  Interval history: he takes these pump settings:   basal rate of 2 units/hr, 24 hrs per day.   mealtime bolus of 1 unit/3 grams carbohydrate.  However, subtract 15 units from your calculated lunch bolus.  continue correction bolus (which some people call "sensitivity," or "insulin sensitivity ratio," or just "isr") of 1 unit for each 10 by which your glucose exceeds 100.  Total daily insulin dosage: approx 110 units.   No recent steroids.  Meter is downloaded today, and the printout is scanned into the record.  It varies from 90-320.  There is no trend throughout the day.  Past Medical History:  Diagnosis Date  . Allergy   . Anal fissure   . Anemia, unspecified   . Arthritis    Spinal OA  . BACK PAIN, LUMBAR 10/23/2007  . CARDIAC MURMUR, SYSTOLIC 10/31/7586  . DIABETES MELLITUS, TYPE I 05/20/2007  . DIASTOLIC DYSFUNCTION 01/14/4981  . 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[place])   . Duodenitis   . Edema 05/12/2008  . Esophageal stricture   . GERD (gastroesophageal reflux disease)   . 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 Gynecomastia   . Hepatic steatosis   . HYPERLIPIDEMIA 05/20/2007  . Hypertension   . Hypogonadism male   . Morbid obesity ([place]) 09/10/2009  . OBSTRUCTIVE SLEEP APNEA 11/04/2007  . Personal history of colonic polyps   . RENAL INSUFFICIENCY 05/12/2008  . Sleep apnea    uses cpap  . Urolithiasis     Past Surgical History:  Procedure Laterality Date  . Abdominal US  09/1997  . arm fracture Left 1958   with hardware  . Colon cancer screening    . COLONOSCOPY   08/18/2004   diverticulitis  . COLONOSCOPY  09/24/2009  . ELECTROCARDIOGRAM  02/2006  . FLEXIBLE SIGMOIDOSCOPY  03/04/2001   polyps, anal fissure  . GANGLION CYST EXCISION Left 1980  . Lower Arterial  04/13/2004  . POLYPECTOMY    . Rest Cardiolite  03/19/2003    Social History   Socioeconomic History  . Marital status: Married    Spouse name: Not on file  . Number of children: Not on file  . Years of education: Not on file  . Highest education level: Not on file  Social Needs  . Financial resource strain: Not on file  . Food insecurity - worry: Not on file  . Food insecurity - inability: Not on file  . Transportation needs - medical: Not on file  . Transportation needs - non-medical: Not on file  Occupational History    Employer: [employer]  Tobacco Use  . Smoking status: Former Smoker    Packs/day: 2.00    Years: 31.00    Pack years: 62.00    Types: Cigarettes    Last attempt to quit: 10/23/1982    Years since quitting: 35.2  . Smokeless tobacco: Never Used  Substance and Sexual Activity  . Alcohol use: No    Alcohol/week: 0.0 oz  . Drug use: No  . Sexual activity: Not on file  Other Topics Concern  . Not on file  Social History Narrative   Lives with wife in a one story home.  Has a son and a daughter.     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     Education: 2 years of college.    Current Outpatient Medications on File Prior to Visit  Medication Sig Dispense Refill  . ACCU-CHEK AVIVA PLUS test strip USE TO TEST BLOOD SUGAR 6  TIMES PER DAY. 600 each 3  . ACCU-CHEK FASTCLIX LANCETS MISC TEST 4 TIMES DAILY AS  DIRECTED 408 each 3  . alfuzosin (UROXATRAL) 10 MG 24 hr tablet Take 1 tablet (10 mg total) by mouth daily with breakfast. 90 tablet 2  . allopurinol (ZYLOPRIM) 300 MG tablet TAKE 1 TABLET BY MOUTH  DAILY 90 tablet 3  . amitriptyline (ELAVIL) 10 MG tablet TAKE 1 TABLET BY MOUTH AT  BEDTIME 30 tablet 2  . aspirin (BAYER LOW STRENGTH) 81  MG EC tablet Take 81 mg by mouth daily.      . Blood Glucose Monitoring Suppl (ACCU-CHEK AVIVA PLUS) w/Device KIT Use as directed 1 kit 0  . carvedilol (COREG) 3.125 MG tablet TAKE 1/2 TABLETS BY MOUTH 2 TIMES DAILY WITH A MEAL. 180 tablet 3  . cycloSPORINE (RESTASIS) 0.05 % ophthalmic emulsion Place 1 drop into both eyes 2 (two) times daily.      . diclofenac sodium (VOLTAREN) 1 % GEL Apply 2 g topically 4 (four) times daily. 1 Tube 1  . DULoxetine (CYMBALTA) 60 MG capsule TAKE 1 CAPSULE BY MOUTH  DAILY 30 capsule 0  . fluticasone (FLONASE) 50 MCG/ACT nasal spray Place 1 spray into the nose daily as needed for allergies.     . folic acid (FOLVITE) 1 MG tablet Take 2 mg by mouth 2 (two) times daily. 4 tabs daily    . furosemide (LASIX) 20 MG tablet TAKE 1 TABLET BY MOUTH  DAILY 90 tablet 3  . Insulin Infusion Pump Supplies (ACCU-CHEK PLASTIC CARTRIDGE) MISC USE 1 CARTRIDGE EVERY 2 DAYS 45 each 3  . Insulin Infusion Pump Supplies (ULTRAFLEX) MISC CHANGE AS DIRECTED 30 each 6  . insulin lispro (HUMALOG) 100 UNIT/ML injection For use in pump, for a total of 110 units per day 110 mL 3  . insulin lispro (HUMALOG) 100 UNIT/ML injection INJECT SUBCUTANEOUSLY FOR  USE IN PUMP FOR A TOTAL OF  110 UNITS PER DAY 40 mL 4  . methocarbamol (ROBAXIN) 500 MG tablet Take 1 tablet (500 mg total) by mouth every 8 (eight) hours as needed for muscle spasms. 90 tablet 1  . mometasone (ELOCON) 0.1 % lotion APPLY TOPICALLY DAILY 60 mL 11  . Multiple Vitamins-Minerals (CENTRUM SILVER PO) Take 1 tablet by mouth daily.      . Omega-3 Fatty Acids (FISH OIL) 1200 MG CAPS Take 1,200 mg by mouth daily.     Marland Kitchen omeprazole (PRILOSEC) 20 MG capsule TAKE 1 CAPSULE BY MOUTH  DAILY AS NEEDED 90 capsule 3  . rosuvastatin (CRESTOR) 40 MG tablet TAKE 1 TABLET BY MOUTH AT  BEDTIME 90 tablet 2  . vitamin B-12 (CYANOCOBALAMIN) 1000 MCG tablet Take 1,000 mcg by mouth daily.     No current facility-administered medications on file prior to  visit.     Allergies  Allergen Reactions  . Atorvastatin Other (See Comments)    unknown  . Niacin     REACTION: Severe heartburn  . Pioglitazone     REACTION: Edema    Family History  Problem Relation Age  of Onset  . Colon cancer Brother 3  . Colon polyps Brother   . Cancer Brother        lung cancer, deceased  . Other Mother        MVA, deceased 60s  . Healthy Daughter   . Healthy Son   . Rectal cancer Neg Hx   . Stomach cancer Neg Hx     BP 140/80   Pulse 82   Ht '5\' 11"'  (1.803 m)   Wt 274 lb (124.3 kg)   SpO2 97%   BMI 38.22 kg/m    Review of Systems He denies hypoglycemia    Objective:   Physical Exam VITAL SIGNS:  See vs page GENERAL: no distress Pulses: dorsalis pedis intact bilat.   MSK: no deformity of the feet CV: trace bilat leg edema Skin:  no ulcer on the feet.  normal color and temp on the feet.  Neuro: sensation is intact to touch on the feet, but decreased from normal.    Lab Results  Component Value Date   HGBA1C 7.7 (H) 01/07/2018      Assessment & Plan:  Insulin-requiring type 2 DM, with renal insuff: he needs increased rx:  increase your mealtime bolus of 1 unit/2.8 grams carbohydrate. However, subtract 15 units from your calculated lunch bolus.

## 2018-01-07 NOTE — Patient Instructions (Addendum)
Please take these pump settings:  basal rate of 2 units/hr, 24 hrs per day.   mealtime bolus of 1 unit/3 grams carbohydrate.  However, subtract 15 units from your calculated lunch bolus.  continue correction bolus (which some people call "sensitivity," or "insulin sensitivity ratio," or just "isr") of 1 unit for each 10 by which your glucose exceeds 100. blood tests are requested for you today.  We'll let you know about the results.    Please come back for a follow-up appointment in 4 months.

## 2018-01-11 ENCOUNTER — Encounter: Payer: Medicare Other | Attending: Physical Medicine & Rehabilitation | Admitting: Physical Medicine & Rehabilitation

## 2018-01-11 ENCOUNTER — Encounter: Payer: Self-pay | Admitting: Physical Medicine & Rehabilitation

## 2018-01-11 VITALS — BP 138/75 | HR 80

## 2018-01-11 DIAGNOSIS — G8929 Other chronic pain: Secondary | ICD-10-CM

## 2018-01-11 DIAGNOSIS — E1022 Type 1 diabetes mellitus with diabetic chronic kidney disease: Secondary | ICD-10-CM | POA: Diagnosis not present

## 2018-01-11 DIAGNOSIS — N189 Chronic kidney disease, unspecified: Secondary | ICD-10-CM | POA: Diagnosis not present

## 2018-01-11 DIAGNOSIS — R531 Weakness: Secondary | ICD-10-CM | POA: Diagnosis not present

## 2018-01-11 DIAGNOSIS — E1042 Type 1 diabetes mellitus with diabetic polyneuropathy: Secondary | ICD-10-CM | POA: Diagnosis not present

## 2018-01-11 DIAGNOSIS — R202 Paresthesia of skin: Secondary | ICD-10-CM | POA: Diagnosis not present

## 2018-01-11 DIAGNOSIS — G479 Sleep disorder, unspecified: Secondary | ICD-10-CM | POA: Diagnosis not present

## 2018-01-11 DIAGNOSIS — E1142 Type 2 diabetes mellitus with diabetic polyneuropathy: Secondary | ICD-10-CM

## 2018-01-11 DIAGNOSIS — M109 Gout, unspecified: Secondary | ICD-10-CM | POA: Diagnosis not present

## 2018-01-11 DIAGNOSIS — Z87891 Personal history of nicotine dependence: Secondary | ICD-10-CM | POA: Diagnosis not present

## 2018-01-11 DIAGNOSIS — R200 Anesthesia of skin: Secondary | ICD-10-CM | POA: Insufficient documentation

## 2018-01-11 DIAGNOSIS — M791 Myalgia, unspecified site: Secondary | ICD-10-CM | POA: Insufficient documentation

## 2018-01-11 DIAGNOSIS — M461 Sacroiliitis, not elsewhere classified: Secondary | ICD-10-CM | POA: Diagnosis not present

## 2018-01-11 DIAGNOSIS — Z87442 Personal history of urinary calculi: Secondary | ICD-10-CM | POA: Insufficient documentation

## 2018-01-11 DIAGNOSIS — E785 Hyperlipidemia, unspecified: Secondary | ICD-10-CM | POA: Insufficient documentation

## 2018-01-11 DIAGNOSIS — I129 Hypertensive chronic kidney disease with stage 1 through stage 4 chronic kidney disease, or unspecified chronic kidney disease: Secondary | ICD-10-CM | POA: Diagnosis not present

## 2018-01-11 DIAGNOSIS — K219 Gastro-esophageal reflux disease without esophagitis: Secondary | ICD-10-CM | POA: Insufficient documentation

## 2018-01-11 DIAGNOSIS — M545 Low back pain, unspecified: Secondary | ICD-10-CM

## 2018-01-11 DIAGNOSIS — M542 Cervicalgia: Secondary | ICD-10-CM | POA: Diagnosis not present

## 2018-01-11 DIAGNOSIS — G4733 Obstructive sleep apnea (adult) (pediatric): Secondary | ICD-10-CM | POA: Diagnosis not present

## 2018-01-11 NOTE — Progress Notes (Signed)
Subjective:    Patient ID: [name], male    DOB: November 17, 1940, 77 y.o.   MRN: [id number]  HPI  77 y/o male with pmh of OSA, CKD, HTN, DM type 1 with peripheral neuropathy presents for follow up of low back pain > neck pain.   On first visit, pt complained of b/l L>R back pain.  Started ~>20 years ago.  No inciting event.  Sitting/laying improve the pain.  Standing exacerbates the pain.  Sharp, burning pain.  Radiates to left posterior thigh.  Intermittent.  He has associated numbness/tingling.  He only tried ASA, which helps some.  Pain limits from doing ADLs and fishing.  He denies falls.  Last clinic visit  09/20/17.  Since last visit, pt states he is not in the pool as much anymore.  He notes improvement with Elavil.  Robaxin is helping, once every 2 days.  Sleep has improved.    Pain Inventory Average Pain 4 Pain Right Now 5 My pain is constant, sharp, burning and tingling  In the last 24 hours, has pain interfered with the following? General activity 5 Relation with others 5 Enjoyment of life 6 What TIME of day is your pain at its worst? morning Sleep (in general) Good  Pain is worse with: walking, bending and standing Pain improves with: rest and TENS Relief from Meds: 4  Mobility walk without assistance use a cane ability to climb steps?  yes do you drive?  yes  Function retired  Neuro/Psych weakness numbness tingling trouble walking  Prior Studies Any changes since last visit?  no  Physicians involved in your care Any changes since last visit?  no   Family History  Problem Relation Age of Onset  . Colon cancer Brother 23  . Colon polyps Brother   . Cancer Brother        lung cancer, deceased  . Other Mother        MVA, deceased 52s  . Healthy Daughter   . Healthy Son   . Rectal cancer Neg Hx   . Stomach cancer Neg Hx    Social History   Socioeconomic History  . Marital status: Married    Spouse name: Not on file  . Number of children: Not  on file  . Years of education: Not on file  . Highest education level: Not on file  Occupational History    Employer: [employer]  . Financial resource strain: Not on file  . Food insecurity:    Worry: Not on file    Inability: Not on file  . Transportation needs:    Medical: Not on file    Non-medical: Not on file  Tobacco Use  . Smoking status: Former Smoker    Packs/day: 2.00    Years: 31.00    Pack years: 62.00    Types: Cigarettes    Last attempt to quit: 10/23/1982    Years since quitting: 35.2  . Smokeless tobacco: Never Used  Substance and Sexual Activity  . Alcohol use: No    Alcohol/week: 0.0 oz  . Drug use: No  . Sexual activity: Not on file  Lifestyle  . Physical activity:    Days per week: Not on file    Minutes per session: Not on file  . Stress: Not on file  Relationships  . Social connections:    Talks on phone: Not on file    Gets together: Not on file    Attends religious service:  Not on file    Active member of club or organization: Not on file    Attends meetings of clubs or organizations: Not on file    Relationship status: Not on file  Other Topics Concern  . Not on file  Social History Narrative   Lives with wife in a one story home.  Has a son and a daughter.     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     Education: 2 years of college.   Past Surgical History:  Procedure Laterality Date  . Abdominal US  09/1997  . arm fracture Left 1958   with hardware  . Colon cancer screening    . COLONOSCOPY  08/18/2004   diverticulitis  . COLONOSCOPY  09/24/2009  . ELECTROCARDIOGRAM  02/2006  . FLEXIBLE SIGMOIDOSCOPY  03/04/2001   polyps, anal fissure  . GANGLION CYST EXCISION Left 1980  . Lower Arterial  04/13/2004  . POLYPECTOMY    . Rest Cardiolite  03/19/2003   Past Medical History:  Diagnosis Date  . Allergy   . Anal fissure   . Anemia, unspecified   . Arthritis    Spinal OA  . BACK PAIN, LUMBAR  10/23/2007  . CARDIAC MURMUR, SYSTOLIC 02/23/85  . DIABETES MELLITUS, TYPE I 05/20/2007  . DIASTOLIC DYSFUNCTION 7/61/9509  . 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[place])   . Duodenitis   . Edema 05/12/2008  . Esophageal stricture   . GERD (gastroesophageal reflux disease)   . 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 Gynecomastia   . Hepatic steatosis   . HYPERLIPIDEMIA 05/20/2007  . Hypertension   . Hypogonadism male   . Morbid obesity ([place]) 09/10/2009  . OBSTRUCTIVE SLEEP APNEA 11/04/2007  . Personal history of colonic polyps   . RENAL INSUFFICIENCY 05/12/2008  . Sleep apnea    uses cpap  . Urolithiasis    BP 138/75   Pulse 80   SpO2 97%   Opioid Risk Score:   Fall Risk Score:  `1  Depression screen PHQ 2/9  Depression screen [facility] 2/9 09/20/2017 08/31/2016  Decreased Interest 0 0  Down, Depressed, Hopeless 0 0  PHQ - 2 Score 0 0  Altered sleeping - 0  Tired, decreased energy - 1  Change in appetite - 0  Feeling bad or failure about yourself  - 0  Trouble concentrating - 0  Moving slowly or fidgety/restless - 0  Suicidal thoughts - 0  PHQ-9 Score - 1  Difficult doing work/chores - Somewhat difficult  Some recent data might be hidden    Review of Systems  HENT: Negative.   Eyes: Negative.   Respiratory: Positive for apnea and shortness of breath.   Cardiovascular: Positive for leg swelling.  Gastrointestinal: Negative.   Endocrine:       Diabetic High blood sugar  Genitourinary: Positive for dysuria.          Musculoskeletal: Positive for arthralgias, back pain, myalgias and neck pain.  Skin: Negative.   Allergic/Immunologic: Negative.   Neurological: Positive for weakness and numbness.       Tingling  Hematological: Negative.   All other systems reviewed and are negative.     Objective:   Physical Exam Gen: NAD. Vital signs reviewed HENT: Normocephalic, Atraumatic Eyes: EOMI. No discharge.  Cardio: RRR. No JVD. Pulm: B/l clear to auscultation.  Effort normal Abd:  Soft, BS+ MSK:  Gait WNL.   +Mild TTP right iliac crest  No edema.   ROM WNL Neuro:  Strength  5/5 in all LE myotomes Skin: Warm and Dry. Intact.     Assessment & Plan:  77 y/o male with pmh of OSA, CKD, HTN, DM type 1 with peripheral neuropathy presents for follow up of low back pain > neck pain.    1. Chronic mechanical low back pain  MRIs C,L-spine early 2016 suggesting multilevel spondylosis with mild b/l multilevel foraminal narrowing C4-C7 and shallow disc bulge at L5-S1 without central canal or foraminal stenosis.  Avoid NSAIDs due to CKD  Unable to tolerate Gabapentin, Lyrica  Voltaren gel denied by insurance   Cont HEP  Cont TENs  Cont aquatic therapy, not going as much  Cont tylenol  Cont Robaxin 500 TID PRN  Cont Cymbalta 60mg   Cont back brace during periods of excessive activity, reminded to avoid prolonged use  Cont Lidoderm OTC  2. Sacroiliitis  See #1  Cont brace  Not main pain generator at present  3. Sleep disturbance  Recently new mattress  Cont CPAP  Cont Elavil 10qhs  4. Myalgia  Good benefit with trigger point injections for a short period  No need to repeat at present  5. Diabetic neuropathy  See #1  6. Morbid obesity  Not interested in seeing dietitian at this time

## 2018-01-17 ENCOUNTER — Encounter: Payer: Self-pay | Admitting: Internal Medicine

## 2018-01-18 ENCOUNTER — Ambulatory Visit (INDEPENDENT_AMBULATORY_CARE_PROVIDER_SITE_OTHER): Payer: Medicare Other | Admitting: Internal Medicine

## 2018-01-18 ENCOUNTER — Encounter: Payer: Self-pay | Admitting: Internal Medicine

## 2018-01-18 DIAGNOSIS — G4733 Obstructive sleep apnea (adult) (pediatric): Secondary | ICD-10-CM | POA: Diagnosis not present

## 2018-01-18 NOTE — Patient Instructions (Signed)
We can continue CPAP 11.6, mask of choice, humidifier, supplies, AirView  Keep working with your DME company on mask fit options.  Please call if we can help

## 2018-01-18 NOTE — Progress Notes (Signed)
   Subjective:    Patient ID: [name], male    DOB: 1941/10/04, 77 y.o.   MRN: [id number]  HPI   male followed for OSA, complicated by obesity, HBP, DM 2, diastolic dysfunction, GERD NPSG 2004 AHI 9/hr  --------------------------------------------------------------------------------------------  10/06/2016-77 year old male followed for OSA, complicated by obesity, HBP, DM 2, diastolic dysfunction, GERD CPAP 11.6/[facility] FOLLOWS FOR: [facility]. Pt wears CPAP nightly;pt brought machine without cord. Will need to get DL-pt going by there today for filters. Dealing with arthritis pains and understands benefits that would follow from successful weight loss. Can't sleep without CPAP. Not sure how old the machine is now and whether he might be eligible for replacement.  01/18/18- 77 year old male followed for OSA, lung nodules,  complicated by obesity, HBP, DM 2, diastolic dysfunction, GERD CPAP 11.6/[facility] ----OSA: DME: [facility]. Pt wears CPAP nightly and DL attached. Pt will need order for new supplies.  Mask leaks des[pite adjustments. Download 100% compliance AHI 3.4/hour.  He notices some mask leak but it is not keeping him awake.  Has done several mask fitting sessions.  Chinstrap was annoying. Chest feels fine with no cough. CT chest 12/31/15 Small bilateral lung nodules are stable and likely benign given over 1 year of stability. No other nodules present. Mild lingular scarring Cholelithiasis  Review of Systems + = Positive Constitutional: Negative for fever and unexpected weight change.  HENT: Negative for congestion, dental problem, ear pain, nosebleeds, postnasal drip, rhinorrhea, sinus pressure, sneezing, sore throat and trouble swallowing.   Eyes: Negative for redness and itching.  Respiratory: Negative for cough, chest tightness, shortness of breath and wheezing.   Cardiovascular: Negative for palpitations and leg swelling.    Gastrointestinal: Negative for nausea and vomiting.  Genitourinary: Negative for dysuria.  Musculoskeletal: Negative for joint swelling.  Skin: Negative for rash.  Neurological: Negative for headaches.  Hematological: Does not bruise/bleed easily.  Psychiatric/Behavioral: Negative for dysphoric mood. The patient is not nervous/anxious.    Objective:  OBJ- Physical Exam General- Alert, Oriented, Affect-appropriate, Distress- none acute, + obese Skin- rash-none, lesions- none, excoriation- none Lymphadenopathy- none Head- atraumatic            Eyes- Gross vision intact, PERRLA, conjunctivae and secretions clear            Ears- Hearing, canals-normal            Nose- Clear, no-Septal dev, mucus, polyps, erosion, perforation             Throat- Mallampati III , mucosa clear , drainage- none, tonsils- atrophic Neck- flexible , trachea midline, no stridor , thyroid nl, carotid no bruit Chest - symmetrical excursion , unlabored           Heart/CV- RRR , no murmur , no gallop  , no rub, nl s1 s2                           - JVD- none , edema- none, stasis changes- none, varices- none           Lung- clear to P&A, wheeze- none, cough- none , dullness-none, rub- none           Chest wall-  Abd-  Br/ Gen/ Rectal- Not done, not indicated Extrem- cyanosis- none, clubbing, none, atrophy- none, strength- nl Neuro- grossly intact to observation    Assessment & Plan:

## 2018-01-20 NOTE — Assessment & Plan Note (Signed)
Sustained effort at weight loss is encouraged.

## 2018-01-20 NOTE — Assessment & Plan Note (Addendum)
OSA was mild when originally assessed.  He has been very compliant with CPAP, is used to it, and continues to feel he sleeps better with it.  We discussed mask fit but he does not feel the need to make any changes. Plan-continue CPAP

## 2018-01-22 ENCOUNTER — Other Ambulatory Visit: Payer: Self-pay | Admitting: Endocrinology

## 2018-01-29 ENCOUNTER — Telehealth: Payer: Self-pay | Admitting: Nutrition

## 2018-01-29 NOTE — Telephone Encounter (Signed)
PT. Came into the office and changes were made to his pump settings, per Dr. Cordelia Pen order.  He was called today and patient said blood sugars are much better.  He was in the car, and could not give me specific readings.

## 2018-01-29 NOTE — Telephone Encounter (Signed)
Opened in error

## 2018-02-06 ENCOUNTER — Ambulatory Visit: Payer: Medicare Other | Admitting: Internal Medicine

## 2018-02-25 ENCOUNTER — Other Ambulatory Visit: Payer: Self-pay | Admitting: Physical Medicine & Rehabilitation

## 2018-04-01 ENCOUNTER — Other Ambulatory Visit: Payer: Self-pay | Admitting: Physical Medicine & Rehabilitation

## 2018-04-01 ENCOUNTER — Other Ambulatory Visit: Payer: Self-pay | Admitting: Endocrinology

## 2018-04-15 ENCOUNTER — Other Ambulatory Visit: Payer: Self-pay | Admitting: Endocrinology

## 2018-04-17 ENCOUNTER — Other Ambulatory Visit: Payer: Self-pay

## 2018-04-17 MED ORDER — ACCU-CHEK FASTCLIX LANCETS MISC: 11 refills | Status: DC

## 2018-05-03 ENCOUNTER — Encounter: Payer: Medicare Other | Attending: Physical Medicine & Rehabilitation | Admitting: Physical Medicine & Rehabilitation

## 2018-05-03 ENCOUNTER — Encounter: Payer: Self-pay | Admitting: Physical Medicine & Rehabilitation

## 2018-05-03 VITALS — BP 148/77 | HR 78 | Resp 14 | Ht 72.0 in | Wt 276.0 lb

## 2018-05-03 DIAGNOSIS — I129 Hypertensive chronic kidney disease with stage 1 through stage 4 chronic kidney disease, or unspecified chronic kidney disease: Secondary | ICD-10-CM | POA: Diagnosis not present

## 2018-05-03 DIAGNOSIS — E1142 Type 2 diabetes mellitus with diabetic polyneuropathy: Secondary | ICD-10-CM | POA: Diagnosis not present

## 2018-05-03 DIAGNOSIS — E1042 Type 1 diabetes mellitus with diabetic polyneuropathy: Secondary | ICD-10-CM | POA: Diagnosis not present

## 2018-05-03 DIAGNOSIS — N189 Chronic kidney disease, unspecified: Secondary | ICD-10-CM | POA: Insufficient documentation

## 2018-05-03 DIAGNOSIS — G479 Sleep disorder, unspecified: Secondary | ICD-10-CM

## 2018-05-03 DIAGNOSIS — E1022 Type 1 diabetes mellitus with diabetic chronic kidney disease: Secondary | ICD-10-CM | POA: Insufficient documentation

## 2018-05-03 DIAGNOSIS — Z87891 Personal history of nicotine dependence: Secondary | ICD-10-CM | POA: Insufficient documentation

## 2018-05-03 DIAGNOSIS — M461 Sacroiliitis, not elsewhere classified: Secondary | ICD-10-CM | POA: Diagnosis not present

## 2018-05-03 DIAGNOSIS — G8929 Other chronic pain: Secondary | ICD-10-CM | POA: Diagnosis not present

## 2018-05-03 DIAGNOSIS — M545 Low back pain: Secondary | ICD-10-CM | POA: Insufficient documentation

## 2018-05-03 DIAGNOSIS — M791 Myalgia, unspecified site: Secondary | ICD-10-CM | POA: Diagnosis not present

## 2018-05-03 DIAGNOSIS — G4733 Obstructive sleep apnea (adult) (pediatric): Secondary | ICD-10-CM | POA: Insufficient documentation

## 2018-05-03 MED ORDER — BACLOFEN 10 MG PO TABS
10.0000 mg | ORAL_TABLET | Freq: Three times a day (TID) | ORAL | 1 refills | Status: DC
Start: 2018-05-03 — End: 2018-06-25

## 2018-05-03 MED ORDER — AMITRIPTYLINE HCL 10 MG PO TABS: 20.0000 mg | ORAL_TABLET | Freq: Every day | ORAL | 2 refills | Status: DC

## 2018-05-03 MED ORDER — AMITRIPTYLINE HCL 10 MG PO TABS: 20.0000 mg | ORAL_TABLET | Freq: Every day | ORAL | 3 refills | Status: DC

## 2018-05-03 NOTE — Progress Notes (Signed)
Subjective:    Patient ID: [name], male    DOB: [birth year], 77 y.o.   MRN: [id number]  HPI  77 y/o male with pmh of OSA, CKD, HTN, DM type 1 with peripheral neuropathy presents for follow up of low back pain > neck pain.   On first visit, pt complained of b/l L>R back pain.  Started ~>20 years ago.  No inciting event.  Sitting/laying improve the pain.  Standing exacerbates the pain.  Sharp, burning pain.  Radiates to left posterior thigh.  Intermittent.  He has associated numbness/tingling.  He only tried ASA, which helps some.  Pain limits from doing ADLs and fishing.  He denies falls.  Last clinic visit  01/11/18.  Since last visit, pt states he has had good benefit with Elavil. He stopped going to the pool because he did not get cured after 3 visit.  He states he can live like he is.  He is having good relief with TENS too.   Pain Inventory Average Pain 2 Pain Right Now 1 My pain is burning  In the last 24 hours, has pain interfered with the following? General activity 6 Relation with others 2 Enjoyment of life 7 What TIME of day is your pain at its worst? daytime Sleep (in general) Good  Pain is worse with: walking, standing and some activites Pain improves with: rest, heat/ice and TENS Relief from Meds: na  Mobility walk without assistance ability to climb steps?  yes do you drive?  yes  Function retired  Neuro/Psych weakness numbness tingling  Prior Studies Any changes since last visit?  no  Physicians involved in your care Any changes since last visit?  no   Family History  Problem Relation Age of Onset  . Colon cancer Brother 53  . Colon polyps Brother   . Cancer Brother        lung cancer, deceased  . Other Mother        MVA, deceased 68s  . Healthy Daughter   . Healthy Son   . Rectal cancer Neg Hx   . Stomach cancer Neg Hx    Social History   Socioeconomic History  . Marital status: Married    Spouse name: Not on file  . Number of  children: Not on file  . Years of education: Not on file  . Highest education level: Not on file  Occupational History    Employer: [employer]  . Financial resource strain: Not on file  . Food insecurity:    Worry: Not on file    Inability: Not on file  . Transportation needs:    Medical: Not on file    Non-medical: Not on file  Tobacco Use  . Smoking status: Former Smoker    Packs/day: 2.00    Years: 31.00    Pack years: 62.00    Types: Cigarettes    Last attempt to quit: 10/23/1982    Years since quitting: 35.5  . Smokeless tobacco: Never Used  Substance and Sexual Activity  . Alcohol use: No    Alcohol/week: 0.0 oz  . Drug use: No  . Sexual activity: Not on file  Lifestyle  . Physical activity:    Days per week: Not on file    Minutes per session: Not on file  . Stress: Not on file  Relationships  . Social connections:    Talks on phone: Not on file    Gets together: Not on file  Attends religious service: Not on file    Active member of club or organization: Not on file    Attends meetings of clubs or organizations: Not on file    Relationship status: Not on file  Other Topics Concern  . Not on file  Social History Narrative   Lives with wife in a one story home.  Has a son and a daughter.     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     Education: 2 years of college.   Past Surgical History:  Procedure Laterality Date  . Abdominal US  09/1997  . arm fracture Left 1958   with hardware  . Colon cancer screening    . COLONOSCOPY  08/18/2004   diverticulitis  . COLONOSCOPY  09/24/2009  . ELECTROCARDIOGRAM  02/2006  . FLEXIBLE SIGMOIDOSCOPY  03/04/2001   polyps, anal fissure  . GANGLION CYST EXCISION Left 1980  . Lower Arterial  04/13/2004  . POLYPECTOMY    . Rest Cardiolite  03/19/2003   Past Medical History:  Diagnosis Date  . Allergy   . Anal fissure   . Anemia, unspecified   . Arthritis    Spinal OA  . BACK  PAIN, LUMBAR 10/23/2007  . CARDIAC MURMUR, SYSTOLIC 05/27/1323  . DIABETES MELLITUS, TYPE I 05/20/2007  . DIASTOLIC DYSFUNCTION 01/22/271  . 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[place])   . Duodenitis   . Edema 05/12/2008  . Esophageal stricture   . GERD (gastroesophageal reflux disease)   . 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 Gynecomastia   . Hepatic steatosis   . HYPERLIPIDEMIA 05/20/2007  . Hypertension   . Hypogonadism male   . Morbid obesity ([place]) 09/10/2009  . OBSTRUCTIVE SLEEP APNEA 11/04/2007  . Personal history of colonic polyps   . RENAL INSUFFICIENCY 05/12/2008  . Sleep apnea    uses cpap  . Urolithiasis    BP (!) 148/77 (BP Location: Right Arm, Patient Position: Sitting, Cuff Size: Large)   Pulse 78   Resp 14   Ht 6' (1.829 m)   Wt 276 lb (125.2 kg)   SpO2 96%   BMI 37.43 kg/m   Opioid Risk Score:   Fall Risk Score:  `1  Depression screen PHQ 2/9  Depression screen [facility] 2/9 09/20/2017 08/31/2016  Decreased Interest 0 0  Down, Depressed, Hopeless 0 0  PHQ - 2 Score 0 0  Altered sleeping - 0  Tired, decreased energy - 1  Change in appetite - 0  Feeling bad or failure about yourself  - 0  Trouble concentrating - 0  Moving slowly or fidgety/restless - 0  Suicidal thoughts - 0  PHQ-9 Score - 1  Difficult doing work/chores - Somewhat difficult  Some recent data might be hidden    Review of Systems  HENT: Negative.   Eyes: Negative.   Respiratory: Positive for apnea and shortness of breath.   Cardiovascular: Negative.   Gastrointestinal: Negative.   Endocrine:       Diabetic High blood sugar  Genitourinary: Negative.           Musculoskeletal: Positive for arthralgias, back pain, myalgias and neck pain.  Skin: Negative.   Allergic/Immunologic: Negative.   Neurological: Positive for weakness and numbness.       Tingling  Hematological: Negative.   All other systems reviewed and are negative.     Objective:   Physical Exam Gen: NAD. Vital signs  reviewed HENT: Normocephalic, Atraumatic Eyes: EOMI. No discharge.  Cardio:  RRR. No JVD. Pulm: B/l clear to auscultation.  Effort normal. Abd: Soft, BS+ MSK:  Gait WNL.   +Mild TTP right SI joint  No edema.   ROM WNL Neuro: Alert  Strength  5/5 in all LE myotomes Skin: Warm and Dry. Intact.     Assessment & Plan:  77 y/o male with pmh of OSA, CKD, HTN, DM type 1 with peripheral neuropathy presents for follow up of low back pain > neck pain.    1. Chronic mechanical low back pain  MRIs C,L-spine early 2016 suggesting multilevel spondylosis with mild b/l multilevel foraminal narrowing C4-C7 and shallow disc bulge at L5-S1 without central canal or foraminal stenosis.  Avoid NSAIDs due to CKD  Unable to tolerate Gabapentin, Lyrica  Voltaren gel denied by insurance   Cont HEP  Cont TENs  Aquatic therapy, not going anymore, believes he can just live with the pain  Cont tylenol  Robaxin 500 TID PRN, changed to Baclofen 10 TID PRN due to change insurance  Cont Cymbalta 60mg   Cont back brace during periods of excessive activity, reminded to avoid prolonged use  Cont Lidoderm OTC  Acknowledges he can do more if he make a conscious effort  2. Sacroiliitis  See #1  Cont brace  Not main pain generator at present  3. Sleep disturbance  Recently new mattress  Cont CPAP  Will increase Elavil to 20qhs, trial, decrease to 10 if side effects   4. Myalgia  Good benefit with trigger point injections for a short period  No need to repeat at present  5. Diabetic neuropathy  See #1, #3  6. Morbid obesity  Not interested in seeing dietitian at this time  Discussed weight loss again

## 2018-05-06 ENCOUNTER — Other Ambulatory Visit: Payer: Self-pay | Admitting: Endocrinology

## 2018-05-08 ENCOUNTER — Ambulatory Visit: Payer: Medicare Other | Admitting: Physical Medicine & Rehabilitation

## 2018-05-09 ENCOUNTER — Ambulatory Visit: Payer: Medicare Other | Admitting: Endocrinology

## 2018-05-09 ENCOUNTER — Encounter: Payer: Self-pay | Admitting: Endocrinology

## 2018-05-09 VITALS — BP 138/82 | HR 80 | Wt 274.6 lb

## 2018-05-09 DIAGNOSIS — Z0000 Encounter for general adult medical examination without abnormal findings: Secondary | ICD-10-CM | POA: Diagnosis not present

## 2018-05-09 DIAGNOSIS — E0800 Diabetes mellitus due to underlying condition with hyperosmolarity without nonketotic hyperglycemic-hyperosmolar coma (NKHHC): Secondary | ICD-10-CM

## 2018-05-09 DIAGNOSIS — Z125 Encounter for screening for malignant neoplasm of prostate: Secondary | ICD-10-CM | POA: Diagnosis not present

## 2018-05-09 DIAGNOSIS — M1A9XX0 Chronic gout, unspecified, without tophus (tophi): Secondary | ICD-10-CM | POA: Diagnosis not present

## 2018-05-09 LAB — CBC WITH DIFFERENTIAL/PLATELET
BASOS PCT: 0.2 % (ref 0.0–3.0)
Basophils Absolute: 0 10*3/uL (ref 0.0–0.1)
EOS PCT: 1.4 % (ref 0.0–5.0)
Eosinophils Absolute: 0.1 10*3/uL (ref 0.0–0.7)
HCT: 43.6 % (ref 39.0–52.0)
Hemoglobin: 14.7 g/dL (ref 13.0–17.0)
Lymphocytes Relative: 32.2 % (ref 12.0–46.0)
Lymphs Abs: 1.5 10*3/uL (ref 0.7–4.0)
MCHC: 33.8 g/dL (ref 30.0–36.0)
MCV: 91.5 fl (ref 78.0–100.0)
MONO ABS: 0.3 10*3/uL (ref 0.1–1.0)
MONOS PCT: 6.1 % (ref 3.0–12.0)
Neutro Abs: 2.8 10*3/uL (ref 1.4–7.7)
Neutrophils Relative %: 60.1 % (ref 43.0–77.0)
Platelets: 126 10*3/uL — ABNORMAL LOW (ref 150.0–400.0)
RBC: 4.76 Mil/uL (ref 4.22–5.81)
RDW: 14.9 % (ref 11.5–15.5)
WBC: 4.7 10*3/uL (ref 4.0–10.5)

## 2018-05-09 LAB — BASIC METABOLIC PANEL
BUN: 25 mg/dL — AB (ref 6–23)
CHLORIDE: 101 meq/L (ref 96–112)
CO2: 31 meq/L (ref 19–32)
CREATININE: 1.47 mg/dL (ref 0.40–1.50)
Calcium: 8.9 mg/dL (ref 8.4–10.5)
GFR: 49.43 mL/min — ABNORMAL LOW (ref >60.00)
Glucose, Bld: 214 mg/dL — ABNORMAL HIGH (ref 70–99)
Potassium: 3.6 mEq/L (ref 3.5–5.1)
Sodium: 140 mEq/L (ref 135–145)

## 2018-05-09 LAB — PSA: PSA: 3.08 ng/mL (ref 0.10–4.00)

## 2018-05-09 LAB — LIPID PANEL
CHOLESTEROL: 127 mg/dL (ref 0–200)
HDL: 34.3 mg/dL — ABNORMAL LOW (ref >39.00)
LDL Cholesterol: 59 mg/dL (ref 0–99)
NonHDL: 93.12
TRIGLYCERIDES: 170 mg/dL — AB (ref 0.0–149.0)
Total CHOL/HDL Ratio: 4
VLDL: 34 mg/dL (ref 0.0–40.0)

## 2018-05-09 LAB — HEPATIC FUNCTION PANEL
ALBUMIN: 3.9 g/dL (ref 3.5–5.2)
ALT: 23 U/L (ref 0–53)
AST: 17 U/L (ref 0–37)
Alkaline Phosphatase: 82 U/L (ref 39–117)
Bilirubin, Direct: 0.1 mg/dL (ref 0.0–0.3)
TOTAL PROTEIN: 6.5 g/dL (ref 6.0–8.3)
Total Bilirubin: 0.6 mg/dL (ref 0.2–1.2)

## 2018-05-09 LAB — URINALYSIS, ROUTINE W REFLEX MICROSCOPIC
Bilirubin Urine: NEGATIVE
Hgb urine dipstick: NEGATIVE
Ketones, ur: NEGATIVE
Nitrite: NEGATIVE
RBC / HPF: NONE SEEN (ref 0–?)
SPECIFIC GRAVITY, URINE: 1.02 (ref 1.000–1.030)
URINE GLUCOSE: NEGATIVE
Urobilinogen, UA: 0.2 (ref 0.0–1.0)
pH: 6 (ref 5.0–8.0)

## 2018-05-09 LAB — MICROALBUMIN / CREATININE URINE RATIO
Creatinine,U: 163.3 mg/dL
Microalb Creat Ratio: 1.4 mg/g (ref 0.0–30.0)
Microalb, Ur: 2.2 mg/dL — ABNORMAL HIGH (ref 0.0–1.9)

## 2018-05-09 LAB — POCT GLYCOSYLATED HEMOGLOBIN (HGB A1C): Hemoglobin A1C: 7.5 % — AB (ref 4.0–5.6)

## 2018-05-09 LAB — TSH: TSH: 5.91 u[IU]/mL — ABNORMAL HIGH (ref 0.35–4.50)

## 2018-05-09 LAB — URIC ACID: Uric Acid, Serum: 5.4 mg/dL (ref 4.0–7.8)

## 2018-05-09 NOTE — Progress Notes (Signed)
Subjective:    Patient ID: [name], male    DOB: [birth year], 77 y.o.   MRN: [id number]  HPI Pt is here for regular wellness examination, and is feeling pretty well in general, and says chronic med probs are stable, except as noted below Past Medical History:  Diagnosis Date  . Allergy   . Anal fissure   . Anemia, unspecified   . Arthritis    Spinal OA  . BACK PAIN, LUMBAR 10/23/2007  . CARDIAC MURMUR, SYSTOLIC 10/25/8869  . DIABETES MELLITUS, TYPE I 05/20/2007  . DIASTOLIC DYSFUNCTION 9/59/7471  . 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 Duodenitis   . Edema 05/12/2008  . Esophageal stricture   . GERD (gastroesophageal reflux disease)   . 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 Gynecomastia   . Hepatic steatosis   . HYPERLIPIDEMIA 05/20/2007  . Hypertension   . Hypogonadism male   . Morbid obesity ([place]) 09/10/2009  . OBSTRUCTIVE SLEEP APNEA 11/04/2007  . Personal history of colonic polyps   . RENAL INSUFFICIENCY 05/12/2008  . Sleep apnea    uses cpap  . Urolithiasis     Past Surgical History:  Procedure Laterality Date  . Abdominal US  09/1997  . arm fracture Left 1958   with hardware  . Colon cancer screening    . COLONOSCOPY  08/18/2004   diverticulitis  . COLONOSCOPY  09/24/2009  . ELECTROCARDIOGRAM  02/2006  . FLEXIBLE SIGMOIDOSCOPY  03/04/2001   polyps, anal fissure  . GANGLION CYST EXCISION Left 1980  . Lower Arterial  04/13/2004  . POLYPECTOMY    . Rest Cardiolite  03/19/2003    Social History   Socioeconomic History  . Marital status: Married    Spouse name: Not on file  . Number of children: Not on file  . Years of education: Not on file  . Highest education level: Not on file  Occupational History    Employer: [employer]  . Financial resource strain: Not on file  . Food insecurity:    Worry: Not on file    Inability: Not on file  . Transportation needs:    Medical: Not on file    Non-medical: Not on file  Tobacco Use  . Smoking  status: Former Smoker    Packs/day: 2.00    Years: 31.00    Pack years: 62.00    Types: Cigarettes    Last attempt to quit: 10/23/1982    Years since quitting: 35.5  . Smokeless tobacco: Never Used  Substance and Sexual Activity  . Alcohol use: No    Alcohol/week: 0.0 oz  . Drug use: No  . Sexual activity: Not on file  Lifestyle  . Physical activity:    Days per week: Not on file    Minutes per session: Not on file  . Stress: Not on file  Relationships  . Social connections:    Talks on phone: Not on file    Gets together: Not on file    Attends religious service: Not on file    Active member of club or organization: Not on file    Attends meetings of clubs or organizations: Not on file    Relationship status: Not on file  . Intimate partner violence:    Fear of current or ex partner: Not on file    Emotionally abused: Not on file    Physically abused: Not on file    Forced sexual activity: Not on file  Other Topics Concern  . Not on file  Social History Narrative   Lives with wife in a one story home.  Has a son and a daughter.     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     Education: 2 years of college.    Current Outpatient Medications on File Prior to Visit  Medication Sig Dispense Refill  . ACCU-CHEK AVIVA PLUS test strip USE TO TEST BLOOD SUGAR 6  TIMES PER DAY. 600 each 3  . ACCU-CHEK FASTCLIX LANCETS MISC TEST 4 TIMES DAILY AS  DIRECTED 408 each 3  . ACCU-CHEK FASTCLIX LANCETS MISC Used to check blood sugars up to 6x daily. 204 each 11  . alfuzosin (UROXATRAL) 10 MG 24 hr tablet Take 1 tablet (10 mg total) by mouth daily with breakfast. 90 tablet 2  . allopurinol (ZYLOPRIM) 300 MG tablet TAKE 1 TABLET BY MOUTH  DAILY 90 tablet 3  . amitriptyline (ELAVIL) 10 MG tablet Take 2 tablets (20 mg total) by mouth at bedtime. 60 tablet 3  . aspirin (BAYER LOW STRENGTH) 81 MG EC tablet Take 81 mg by mouth daily.      . baclofen (LIORESAL) 10 MG tablet Take  1 tablet (10 mg total) by mouth 3 (three) times daily. 90 each 1  . Blood Glucose Monitoring Suppl (ACCU-CHEK AVIVA PLUS) w/Device KIT Use as directed 1 kit 0  . carvedilol (COREG) 3.125 MG tablet TAKE 1/2 TABLETS BY MOUTH 2 TIMES DAILY WITH MEALS. 90 tablet 4  . cycloSPORINE (RESTASIS) 0.05 % ophthalmic emulsion Place 1 drop into both eyes 2 (two) times daily.      . diclofenac sodium (VOLTAREN) 1 % GEL Apply 2 g topically 4 (four) times daily. 1 Tube 1  . fluticasone (FLONASE) 50 MCG/ACT nasal spray Place 1 spray into the nose daily as needed for allergies.     . folic acid (FOLVITE) 1 MG tablet Take 2 mg by mouth 2 (two) times daily. 4 tabs daily    . furosemide (LASIX) 20 MG tablet TAKE 1 TABLET BY MOUTH  DAILY 90 tablet 3  . HUMALOG 100 UNIT/ML injection INJECT SUBCUTANEOUSLY A  TOTAL OF 110 UNITS PER DAY  FOR USE IN INSULIN PUMP 100 mL 0  . Insulin Infusion Pump Supplies (ACCU-CHEK PLASTIC CARTRIDGE) MISC USE 1 CARTRIDGE EVERY 2 DAYS 45 each 3  . Insulin Infusion Pump Supplies (ULTRAFLEX) MISC CHANGE AS DIRECTED 30 each 6  . insulin lispro (HUMALOG) 100 UNIT/ML injection For use in pump, for a total of 110 units per day 110 mL 3  . insulin lispro (HUMALOG) 100 UNIT/ML injection INJECT SUBCUTANEOUSLY FOR  USE IN PUMP FOR A TOTAL OF  110 UNITS PER DAY 40 mL 4  . mometasone (ELOCON) 0.1 % lotion APPLY TOPICALLY DAILY 60 mL 11  . Multiple Vitamins-Minerals (CENTRUM SILVER PO) Take 1 tablet by mouth daily.      . Omega-3 Fatty Acids (FISH OIL) 1200 MG CAPS Take 1,200 mg by mouth daily.     Marland Kitchen omeprazole (PRILOSEC) 20 MG capsule TAKE 1 CAPSULE BY MOUTH  DAILY AS NEEDED 90 capsule 3  . rosuvastatin (CRESTOR) 40 MG tablet TAKE 1 TABLET BY MOUTH AT  BEDTIME 90 tablet 2  . vitamin B-12 (CYANOCOBALAMIN) 1000 MCG tablet Take 1,000 mcg by mouth daily.     No current facility-administered medications on file prior to visit.     Allergies  Allergen Reactions  . Atorvastatin Other (See Comments)  unknown  . Niacin     REACTION: Severe heartburn  . Pioglitazone     REACTION: Edema    Family History  Problem Relation Age of Onset  . Colon cancer Brother 32  . Colon polyps Brother   . Cancer Brother        lung cancer, deceased  . Other Mother        MVA, deceased 1s  . Healthy Daughter   . Healthy Son   . Rectal cancer Neg Hx   . Stomach cancer Neg Hx     BP 138/82 (BP Location: Left Arm, Cuff Size: Normal)   Pulse 80   Wt 274 lb 9.6 oz (124.6 kg)   SpO2 93%   BMI 37.24 kg/m     Review of Systems Denies fever, fatigue, visual loss, chest pain, sob, depression, cold intolerance, BRBPR, hematuria, syncope, numbness, and rash.  No change in chronic hearing loss, easy bruising, allergy ,or back pain.      Objective:   Physical Exam VS: see vs page GEN: no distress HEAD: head: no deformity eyes: no periorbital swelling, no proptosis external nose and ears are normal mouth: no lesion seen NECK: supple, thyroid is not enlarged CHEST WALL: no deformity LUNGS: clear to auscultation CV: reg rate and rhythm, no murmur ABD: abdomen is soft, nontender.  no hepatosplenomegaly.  not distended.  no hernia MUSCULOSKELETAL: muscle bulk and strength are grossly normal.  no obvious joint swelling.  gait is normal and steady EXTEMITIES: no deformity.  no ulcer on the feet.  feet are of normal color and temp.  no edema PULSES: dorsalis pedis intact bilat.  no carotid bruit NEURO:  cn 2-12 grossly intact.   readily moves all 4's.  sensation is intact to touch on the feet SKIN:  Normal texture and temperature.  No rash or suspicious lesion is visible.   NODES:  None palpable at the neck PSYCH: alert, well-oriented.  Does not appear anxious nor depressed.    ecg machine is not working     Assessment & Plan:  Wellness visit today, with problems stable, except as noted.   SEPARATE EVALUATION FOLLOWS--EACH PROBLEM HERE IS NEW, NOT RESPONDING TO TREATMENT, OR POSES SIGNIFICANT  RISK TO THE PATIENT'S HEALTH: HISTORY OF THE PRESENT ILLNESS: Pt returns for f/u of diabetes mellitus: DM type: Insulin-requiring type 2 Dx'ed: 4481 Complications: polyneuropathy and renal insufficiency.  Therapy: insulin since 1995.  DKA: never.  Severe hypoglycemia: never.  Pancreatitis: never.  Other: he started pump therapy in mid-2015; he declines continuous glucose monitor.  Interval history: he takes these pump settings:   basal rate of 2 units/hr, 24 hrs per day.   mealtime bolus of 1 unit/3 grams carbohydrate.  However, subtract 15 units from your calculated lunch bolus.  continue correction bolus (which some people call "sensitivity," or "insulin sensitivity ratio," or just "isr") of 1 unit for each 10 by which your glucose exceeds 100.  Total daily insulin dosage: approx 130 units.   No recent steroids.  Pt says cbg's are mostly in the 100's.  There is no trend throughout the day. PAST MEDICAL HISTORY Past Medical History:  Diagnosis Date  . Allergy   . Anal fissure   . Anemia, unspecified   . Arthritis    Spinal OA  . BACK PAIN, LUMBAR 10/23/2007  . CARDIAC MURMUR, SYSTOLIC 05/27/6313  . DIABETES MELLITUS, TYPE I 05/20/2007  . DIASTOLIC DYSFUNCTION 9/70/2637  . 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  Duodenitis   . Edema 05/12/2008  . Esophageal stricture   . GERD (gastroesophageal reflux disease)   . 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 Gynecomastia   . Hepatic steatosis   . HYPERLIPIDEMIA 05/20/2007  . Hypertension   . Hypogonadism male   . Morbid obesity ([place]) 09/10/2009  . OBSTRUCTIVE SLEEP APNEA 11/04/2007  . Personal history of colonic polyps   . RENAL INSUFFICIENCY 05/12/2008  . Sleep apnea    uses cpap  . Urolithiasis     Past Surgical History:  Procedure Laterality Date  . Abdominal US  09/1997  . arm fracture Left 1958   with hardware  . Colon cancer screening    . COLONOSCOPY  08/18/2004   diverticulitis  . COLONOSCOPY  09/24/2009  .  ELECTROCARDIOGRAM  02/2006  . FLEXIBLE SIGMOIDOSCOPY  03/04/2001   polyps, anal fissure  . GANGLION CYST EXCISION Left 1980  . Lower Arterial  04/13/2004  . POLYPECTOMY    . Rest Cardiolite  03/19/2003    Social History   Socioeconomic History  . Marital status: Married    Spouse name: Not on file  . Number of children: Not on file  . Years of education: Not on file  . Highest education level: Not on file  Occupational History    Employer: [employer]  . Financial resource strain: Not on file  . Food insecurity:    Worry: Not on file    Inability: Not on file  . Transportation needs:    Medical: Not on file    Non-medical: Not on file  Tobacco Use  . Smoking status: Former Smoker    Packs/day: 2.00    Years: 31.00    Pack years: 62.00    Types: Cigarettes    Last attempt to quit: 10/23/1982    Years since quitting: 35.5  . Smokeless tobacco: Never Used  Substance and Sexual Activity  . Alcohol use: No    Alcohol/week: 0.0 oz  . Drug use: No  . Sexual activity: Not on file  Lifestyle  . Physical activity:    Days per week: Not on file    Minutes per session: Not on file  . Stress: Not on file  Relationships  . Social connections:    Talks on phone: Not on file    Gets together: Not on file    Attends religious service: Not on file    Active member of club or organization: Not on file    Attends meetings of clubs or organizations: Not on file    Relationship status: Not on file  . Intimate partner violence:    Fear of current or ex partner: Not on file    Emotionally abused: Not on file    Physically abused: Not on file    Forced sexual activity: Not on file  Other Topics Concern  . Not on file  Social History Narrative   Lives with wife in a one story home.  Has a son and a daughter.     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     Education: 2 years of college.    Current Outpatient Medications on File Prior to Visit    Medication Sig Dispense Refill  . ACCU-CHEK AVIVA PLUS test strip USE TO TEST BLOOD SUGAR 6  TIMES PER DAY. 600 each 3  . ACCU-CHEK FASTCLIX LANCETS MISC TEST 4 TIMES DAILY AS  DIRECTED 408 each 3  . ACCU-CHEK FASTCLIX LANCETS MISC Used to  check blood sugars up to 6x daily. 204 each 11  . alfuzosin (UROXATRAL) 10 MG 24 hr tablet Take 1 tablet (10 mg total) by mouth daily with breakfast. 90 tablet 2  . allopurinol (ZYLOPRIM) 300 MG tablet TAKE 1 TABLET BY MOUTH  DAILY 90 tablet 3  . amitriptyline (ELAVIL) 10 MG tablet Take 2 tablets (20 mg total) by mouth at bedtime. 60 tablet 3  . aspirin (BAYER LOW STRENGTH) 81 MG EC tablet Take 81 mg by mouth daily.      . baclofen (LIORESAL) 10 MG tablet Take 1 tablet (10 mg total) by mouth 3 (three) times daily. 90 each 1  . Blood Glucose Monitoring Suppl (ACCU-CHEK AVIVA PLUS) w/Device KIT Use as directed 1 kit 0  . carvedilol (COREG) 3.125 MG tablet TAKE 1/2 TABLETS BY MOUTH 2 TIMES DAILY WITH MEALS. 90 tablet 4  . cycloSPORINE (RESTASIS) 0.05 % ophthalmic emulsion Place 1 drop into both eyes 2 (two) times daily.      . diclofenac sodium (VOLTAREN) 1 % GEL Apply 2 g topically 4 (four) times daily. 1 Tube 1  . fluticasone (FLONASE) 50 MCG/ACT nasal spray Place 1 spray into the nose daily as needed for allergies.     . folic acid (FOLVITE) 1 MG tablet Take 2 mg by mouth 2 (two) times daily. 4 tabs daily    . furosemide (LASIX) 20 MG tablet TAKE 1 TABLET BY MOUTH  DAILY 90 tablet 3  . HUMALOG 100 UNIT/ML injection INJECT SUBCUTANEOUSLY A  TOTAL OF 110 UNITS PER DAY  FOR USE IN INSULIN PUMP 100 mL 0  . Insulin Infusion Pump Supplies (ACCU-CHEK PLASTIC CARTRIDGE) MISC USE 1 CARTRIDGE EVERY 2 DAYS 45 each 3  . Insulin Infusion Pump Supplies (ULTRAFLEX) MISC CHANGE AS DIRECTED 30 each 6  . insulin lispro (HUMALOG) 100 UNIT/ML injection For use in pump, for a total of 110 units per day 110 mL 3  . insulin lispro (HUMALOG) 100 UNIT/ML injection INJECT  SUBCUTANEOUSLY FOR  USE IN PUMP FOR A TOTAL OF  110 UNITS PER DAY 40 mL 4  . mometasone (ELOCON) 0.1 % lotion APPLY TOPICALLY DAILY 60 mL 11  . Multiple Vitamins-Minerals (CENTRUM SILVER PO) Take 1 tablet by mouth daily.      . Omega-3 Fatty Acids (FISH OIL) 1200 MG CAPS Take 1,200 mg by mouth daily.     Marland Kitchen omeprazole (PRILOSEC) 20 MG capsule TAKE 1 CAPSULE BY MOUTH  DAILY AS NEEDED 90 capsule 3  . rosuvastatin (CRESTOR) 40 MG tablet TAKE 1 TABLET BY MOUTH AT  BEDTIME 90 tablet 2  . vitamin B-12 (CYANOCOBALAMIN) 1000 MCG tablet Take 1,000 mcg by mouth daily.     No current facility-administered medications on file prior to visit.     Allergies  Allergen Reactions  . Atorvastatin Other (See Comments)    unknown  . Niacin     REACTION: Severe heartburn  . Pioglitazone     REACTION: Edema    Family History  Problem Relation Age of Onset  . Colon cancer Brother 39  . Colon polyps Brother   . Cancer Brother        lung cancer, deceased  . Other Mother        MVA, deceased 27s  . Healthy Daughter   . Healthy Son   . Rectal cancer Neg Hx   . Stomach cancer Neg Hx     BP 138/82 (BP Location: Left Arm, Cuff Size: Normal)   Pulse  80   Wt 274 lb 9.6 oz (124.6 kg)   SpO2 93%   BMI 37.24 kg/m   REVIEW OF SYSTEMS: He denies hypoglycemia PHYSICAL EXAMINATION: VITAL SIGNS:  See vs page GENERAL: no distress Pulses: dorsalis pedis intact bilat.   MSK: no deformity of the feet CV: trace bilat leg edema Skin:  no ulcer on the feet, but the skin is dry.  normal color and temp on the feet. Neuro: sensation is intact to touch on the feet.   LAB/XRAY RESULTS: A1c=7.5% IMPRESSION: Insulin-requiring type 2 DM, with renal insuff.  therapy limited by variable cbg's PLAN:  Please continue these pump settings:  basal rate of 2 units/hr, 24 hrs per day.   mealtime bolus of 1 unit/3 grams carbohydrate.  However, subtract 15 units from your calculated lunch bolus.  continue correction bolus  (which some people call "sensitivity," or "insulin sensitivity ratio," or just "isr") of 1 unit for each 10 by which your glucose exceeds 100

## 2018-05-09 NOTE — Patient Instructions (Addendum)
Please continue these pump settings:  basal rate of 2 units/hr, 24 hrs per day.   mealtime bolus of 1 unit/3 grams carbohydrate.  However, subtract 15 units from your calculated lunch bolus.  continue correction bolus (which some people call "sensitivity," or "insulin sensitivity ratio," or just "isr") of 1 unit for each 10 by which your glucose exceeds 100. blood tests are requested for you today.  We'll let you know about the results.    Please consider these measures for your health:  minimize alcohol.  Do not use tobacco products.  Have a colonoscopy at least every 10 years from age 97.  Keep firearms safely stored.  Always use seat belts.  have working smoke alarms in your home.  See an eye doctor and dentist regularly.  Never drive under the influence of alcohol or drugs (including prescription drugs).  Those with fair skin should take precautions against the sun, and should carefully examine their skin once per month, for any new or changed moles.  Please come back for a follow-up appointment in 4 months.

## 2018-06-19 LAB — HM DIABETES EYE EXAM

## 2018-06-25 ENCOUNTER — Encounter: Payer: Self-pay | Admitting: Family Medicine

## 2018-06-25 ENCOUNTER — Ambulatory Visit: Payer: Medicare Other | Admitting: Family Medicine

## 2018-06-25 VITALS — BP 120/70 | HR 85 | Ht 72.0 in | Wt 275.1 lb

## 2018-06-25 DIAGNOSIS — H6982 Other specified disorders of Eustachian tube, left ear: Secondary | ICD-10-CM | POA: Diagnosis not present

## 2018-06-25 DIAGNOSIS — F4321 Adjustment disorder with depressed mood: Secondary | ICD-10-CM | POA: Diagnosis not present

## 2018-06-25 NOTE — Progress Notes (Signed)
Subjective:  Patient ID: [name], male    DOB: 10/08/1941  Age: 77 y.o. MRN: [id number]  CC: Establish Care   HPI [name] presents for establishment of care with his new primary care doctor.  Past medical history of diabetes that is now insulin dependent.  He is using regular insulin preprandially.  He is followed by endocrinology for this issue.  He suffers from obesity, chronic lower back pain and right knee pain.  He has diabetic nephropathy.  More significantly he lost his 47 year old daughter this past 06-17-23 died suddenly from an apparent MI.  He has a surviving son who lives locally.  He is currently living with his wife.  They are both reeling from this sudden tragic loss.  Patient does not smoke drink or use illicit drugs.  Exercise is difficult for him due to his multiple orthopedic issues.  He does have a faith community and has been in counseling with his minister along with his wife.  Review of the medical record does show consistently elevated TSH.  Patient is aware of this and Dr. Ebony Hail will be following up with him on this issue.  Patient has issues with hearing congestion affecting his left ear.  He is using Flonase daily.  He has no history of hypertension.  Last hemoglobin A1c was 7.4.  His fasting blood sugar this morning was 160.  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   He has a past surgical history that includes Abdominal US (09/1997); Rest Cardiolite (03/19/2003); Lower Arterial (04/13/2004); electrocardiogram (02/2006); Colonoscopy (08/18/2004); Colonoscopy (09/24/2009); Colon cancer screening; Flexible sigmoidoscopy (03/04/2001); arm fracture (Left, 1958); Ganglion cyst excision (Left, 1980); and Polypectomy.   His family history includes Cancer in his brother; Colon cancer (age of onset: 4) in his brother; Colon polyps in his brother; Healthy in his daughter and son; Other in his mother.He reports that he quit smoking about 35 years ago. His smoking use included cigarettes. He has a 62.00 pack-year smoking history. He has never used smokeless tobacco. He reports that he does not drink alcohol or use drugs.  Outpatient Medications Prior to Visit  Medication Sig Dispense Refill  . ACCU-CHEK AVIVA PLUS test strip USE TO TEST BLOOD SUGAR 6  TIMES PER DAY. 600 each 3  . ACCU-CHEK FASTCLIX LANCETS MISC Used to check blood sugars up to 6x daily. 204 each 11  . alfuzosin (UROXATRAL) 10 MG 24 hr tablet Take 1 tablet (10 mg total) by mouth daily with breakfast. 90 tablet 2  . allopurinol (ZYLOPRIM) 300 MG tablet TAKE 1 TABLET BY MOUTH  DAILY 90 tablet 3  . amitriptyline (ELAVIL) 10 MG tablet Take 2 tablets (20 mg total) by mouth at bedtime. 60 tablet 3  . aspirin (BAYER LOW STRENGTH) 81 MG EC tablet Take 81 mg by mouth daily.      . Blood Glucose Monitoring Suppl (ACCU-CHEK AVIVA PLUS) w/Device KIT Use as directed 1 kit 0  . carvedilol (COREG) 3.125 MG tablet TAKE 1/2 TABLETS BY MOUTH 2 TIMES DAILY WITH MEALS. 90 tablet 4  . cycloSPORINE (RESTASIS)  0.05 % ophthalmic emulsion Place 1 drop into both eyes 2 (two) times daily.      . diclofenac sodium (VOLTAREN) 1 % GEL Apply 2 g topically 4 (four) times daily. 1 Tube 1  . fluticasone (FLONASE) 50 MCG/ACT nasal spray Place 1 spray into the nose daily as needed for allergies.     . folic acid (FOLVITE) 1 MG  tablet Take 2 mg by mouth 2 (two) times daily. 4 tabs daily    . furosemide (LASIX) 20 MG tablet TAKE 1 TABLET BY MOUTH  DAILY 90 tablet 3  . HUMALOG 100 UNIT/ML injection INJECT SUBCUTANEOUSLY A  TOTAL OF 110 UNITS PER DAY  FOR USE IN INSULIN PUMP 100 mL 0  . Insulin Infusion Pump Supplies (ACCU-CHEK PLASTIC CARTRIDGE) MISC USE 1 CARTRIDGE EVERY 2 DAYS 45 each 3  . Insulin Infusion Pump Supplies (ULTRAFLEX) MISC CHANGE AS DIRECTED 30 each 6  . insulin lispro (HUMALOG) 100 UNIT/ML injection INJECT SUBCUTANEOUSLY FOR  USE IN PUMP FOR A TOTAL OF  110 UNITS PER DAY 40 mL 4  . mometasone (ELOCON) 0.1 % lotion APPLY TOPICALLY DAILY 60 mL 11  . Multiple Vitamins-Minerals (CENTRUM SILVER PO) Take 1 tablet by mouth daily.      . Omega-3 Fatty Acids (FISH OIL) 1200 MG CAPS Take 1,200 mg by mouth daily.     Marland Kitchen omeprazole (PRILOSEC) 20 MG capsule TAKE 1 CAPSULE BY MOUTH  DAILY AS NEEDED 90 capsule 3  . rosuvastatin (CRESTOR) 40 MG tablet TAKE 1 TABLET BY MOUTH AT  BEDTIME 90 tablet 2  . vitamin B-12 (CYANOCOBALAMIN) 1000 MCG tablet Take 1,000 mcg by mouth daily.    Marland Kitchen ACCU-CHEK FASTCLIX LANCETS MISC TEST 4 TIMES DAILY AS  DIRECTED 408 each 3  . baclofen (LIORESAL) 10 MG tablet Take 1 tablet (10 mg total) by mouth 3 (three) times daily. 90 each 1  . insulin lispro (HUMALOG) 100 UNIT/ML injection For use in pump, for a total of 110 units per day 110 mL 3   No facility-administered medications prior to visit.     ROS Review of Systems  Constitutional: Negative.   HENT: Positive for hearing loss. Negative for ear pain, postnasal drip and rhinorrhea.   Eyes: Negative.   Respiratory: Negative.   Cardiovascular: Negative.   Gastrointestinal: Negative.   Genitourinary: Negative.   Musculoskeletal: Positive for arthralgias, back pain and gait problem.  Psychiatric/Behavioral: Negative.     Objective:  BP 120/70   Pulse 85   Ht 6' (1.829 m)   Wt 275 lb 2 oz (124.8 kg)   SpO2 98%   BMI 37.31 kg/m    Physical Exam  Constitutional: He is oriented to person, place, and time. He appears well-developed and well-nourished. No distress.  HENT:  Head: Normocephalic and atraumatic.  Right Ear: Hearing, tympanic membrane, external ear and ear canal normal.  Left Ear: External ear and ear canal normal. No foreign bodies. Tympanic membrane is retracted. Tympanic membrane is not injected, not perforated and not erythematous.  No middle ear effusion.  Nose: Nose normal.  Mouth/Throat: Oropharynx is clear and moist.  Eyes: Right eye exhibits no discharge. Left eye exhibits no discharge. No scleral icterus.  Neck: No tracheal deviation present.  Pulmonary/Chest: Effort normal.  Neurological: He is alert and oriented to person, place, and time.  Skin: Skin is warm and dry. He is not diaphoretic.  Psychiatric: He has a normal mood and affect. His behavior is normal.  Assessment & Plan:   Romelle was seen today for establish care.  Diagnoses and all orders for this visit:  Grieving  ETD (Eustachian tube dysfunction), left   I have discontinued Geofrey Faust's baclofen. I am also having him maintain his aspirin, Multiple Vitamins-Minerals (CENTRUM SILVER PO), Fish Oil, cycloSPORINE, folic acid, fluticasone, vitamin B-12, ACCU-CHEK AVIVA PLUS, diclofenac sodium, ULTRAFLEX, furosemide, ACCU-CHEK AVIVA PLUS, allopurinol, ACCU-CHEK PLASTIC CARTRIDGE, alfuzosin, mometasone, insulin lispro, rosuvastatin, carvedilol, omeprazole, ACCU-CHEK FASTCLIX LANCETS, amitriptyline, and HUMALOG.  No orders of the defined types were placed in this encounter.  Patient will continue his Flonase.  Suggested Sudafed as needed.  Do not feel safe using prednisone at this time.  Encouraged continued support from his faith community.  Follow-up in 1 month.  Follow-up: Return in about 1 month (around 07/25/2018).  Libby Maw, MD

## 2018-07-02 ENCOUNTER — Other Ambulatory Visit: Payer: Self-pay | Admitting: Endocrinology

## 2018-07-03 ENCOUNTER — Other Ambulatory Visit: Payer: Self-pay | Admitting: Endocrinology

## 2018-07-30 ENCOUNTER — Ambulatory Visit: Payer: Medicare Other | Admitting: Family Medicine

## 2018-07-30 ENCOUNTER — Encounter: Payer: Self-pay | Admitting: Family Medicine

## 2018-07-30 VITALS — BP 126/78 | HR 79 | Temp 98.1°F | Ht 72.0 in | Wt 276.4 lb

## 2018-07-30 DIAGNOSIS — S29011A Strain of muscle and tendon of front wall of thorax, initial encounter: Secondary | ICD-10-CM | POA: Diagnosis not present

## 2018-07-30 DIAGNOSIS — Z23 Encounter for immunization: Secondary | ICD-10-CM | POA: Diagnosis not present

## 2018-07-30 DIAGNOSIS — F4321 Adjustment disorder with depressed mood: Secondary | ICD-10-CM

## 2018-07-30 NOTE — Progress Notes (Signed)
[name] get his flu shot   Subjective:  Patient ID: [name], male    DOB: December 25, 1940  Age: 77 y.o. MRN: [id number]  CC: Follow-up   HPI [name] presents for follow-up of his grieving status post his daughter's death this past May 18, 2023.  He is the executor of her estate.  Her house is already sold.  He is recovering from a URI that started 3 days ago.  He is no longer febrile and has no URI symptoms to speak of.  He developed left anterior chest wall pain after lifting multiple pumpkins for his church is pumpkin sale.  Outpatient Medications Prior to Visit  Medication Sig Dispense Refill  . ACCU-CHEK AVIVA PLUS test strip USE TO TEST BLOOD SUGAR 6  TIMES PER DAY. 600 each 3  . ACCU-CHEK FASTCLIX LANCETS MISC Used to check blood sugars up to 6x daily. 204 each 11  . alfuzosin (UROXATRAL) 10 MG 24 hr tablet TAKE 1 TABLET BY MOUTH  DAILY WITH BREAKFAST 90 tablet 2  . allopurinol (ZYLOPRIM) 300 MG tablet TAKE 1 TABLET BY MOUTH  DAILY 90 tablet 3  . amitriptyline (ELAVIL) 10 MG tablet Take 2 tablets (20 mg total) by mouth at bedtime. 60 tablet 3  . aspirin (BAYER LOW STRENGTH) 81 MG EC tablet Take 81 mg by mouth daily.      . Blood Glucose Monitoring Suppl (ACCU-CHEK AVIVA PLUS) w/Device KIT Use as directed 1 kit 0  . carvedilol (COREG) 3.125 MG tablet TAKE 1/2 TABLETS BY MOUTH 2 TIMES DAILY WITH MEALS. 90 tablet 4  . cycloSPORINE (RESTASIS) 0.05 % ophthalmic emulsion Place 1 drop into both eyes 2 (two) times daily.      . diclofenac sodium (VOLTAREN) 1 % GEL Apply 2 g topically 4 (four) times daily. 1 Tube 1  . fluticasone (FLONASE) 50 MCG/ACT nasal spray Place 1 spray into the nose daily as needed for allergies.     . folic acid (FOLVITE) 1 MG tablet Take 2 mg by mouth 2 (two) times daily. 4 tabs daily    . furosemide (LASIX) 20 MG tablet TAKE 1 TABLET BY MOUTH  DAILY 90 tablet 3  . HUMALOG 100 UNIT/ML injection INJECT SUBCUTANEOUSLY A  TOTAL OF 110 UNITS PER DAY  FOR USE IN INSULIN PUMP 100  mL 0  . Insulin Infusion Pump Supplies (ACCU-CHEK PLASTIC CARTRIDGE) MISC USE 1 CARTRIDGE EVERY 2 DAYS 45 each 3  . Insulin Infusion Pump Supplies (ULTRAFLEX) MISC CHANGE AS DIRECTED 30 each 6  . insulin lispro (HUMALOG) 100 UNIT/ML injection INJECT SUBCUTANEOUSLY FOR  USE IN PUMP FOR A TOTAL OF  110 UNITS PER DAY 40 mL 4  . mometasone (ELOCON) 0.1 % lotion APPLY TOPICALLY DAILY 60 mL 11  . Multiple Vitamins-Minerals (CENTRUM SILVER PO) Take 1 tablet by mouth daily.      . Omega-3 Fatty Acids (FISH OIL) 1200 MG CAPS Take 1,200 mg by mouth daily.     Marland Kitchen omeprazole (PRILOSEC) 20 MG capsule TAKE 1 CAPSULE BY MOUTH  DAILY AS NEEDED 90 capsule 3  . rosuvastatin (CRESTOR) 40 MG tablet TAKE 1 TABLET BY MOUTH AT  BEDTIME 90 tablet 2  . vitamin B-12 (CYANOCOBALAMIN) 1000 MCG tablet Take 1,000 mcg by mouth daily.     No facility-administered medications prior to visit.     ROS Review of Systems  Constitutional: Negative.   HENT: Negative.   Eyes: Negative.   Respiratory: Negative.   Cardiovascular: Negative.   Gastrointestinal: Negative.  Psychiatric/Behavioral: Negative for dysphoric mood. The patient is not nervous/anxious.     Objective:  BP 126/78   Pulse 79   Temp 98.1 F (36.7 C) (Oral)   Ht 6' (1.829 m)   Wt 276 lb 6 oz (125.4 kg)   SpO2 96%   BMI 37.48 kg/m   BP Readings from Last 3 Encounters:  07/30/18 126/78  06/25/18 120/70  05/09/18 138/82    Wt Readings from Last 3 Encounters:  07/30/18 276 lb 6 oz (125.4 kg)  06/25/18 275 lb 2 oz (124.8 kg)  05/09/18 274 lb 9.6 oz (124.6 kg)    Physical Exam  Constitutional: He is oriented to person, place, and time. He appears well-developed and well-nourished. No distress.  HENT:  Head: Normocephalic and atraumatic.  Right Ear: External ear normal.  Left Ear: External ear normal.  Mouth/Throat: Oropharynx is clear and moist. No oropharyngeal exudate.  Eyes: Pupils are equal, round, and reactive to light. Conjunctivae are  normal. Right eye exhibits no discharge. Left eye exhibits no discharge. No scleral icterus.  Neck: No JVD present. No tracheal deviation present. No thyromegaly present.  Cardiovascular: Normal rate, regular rhythm and normal heart sounds.  Pulmonary/Chest: Effort normal and breath sounds normal.  Lymphadenopathy:    He has no cervical adenopathy.  Neurological: He is alert and oriented to person, place, and time.  Skin: He is not diaphoretic.  Psychiatric: He has a normal mood and affect.    Lab Results  Component Value Date   WBC 4.7 05/09/2018   HGB 14.7 05/09/2018   HCT 43.6 05/09/2018   PLT 126.0 (L) 05/09/2018   GLUCOSE 214 (H) 05/09/2018   CHOL 127 05/09/2018   TRIG 170.0 (H) 05/09/2018   HDL 34.30 (L) 05/09/2018   LDLDIRECT 64.9 12/29/2009   LDLCALC 59 05/09/2018   ALT 23 05/09/2018   AST 17 05/09/2018   NA 140 05/09/2018   K 3.6 05/09/2018   CL 101 05/09/2018   CREATININE 1.47 05/09/2018   BUN 25 (H) 05/09/2018   CO2 31 05/09/2018   TSH 5.91 (H) 05/09/2018   PSA 3.08 05/09/2018   HGBA1C 7.5 (A) 05/09/2018   MICROALBUR 2.2 (H) 05/09/2018    Mr Knee Right W/o Contrast  Result Date: 10/21/2016 CLINICAL DATA:  Right knee injury 20 years ago but now experiencing joint stiffness and buckling of the knee. Pressure and pain anteriorly. EXAM: MRI OF THE RIGHT KNEE WITHOUT CONTRAST TECHNIQUE: Multiplanar, multisequence MR imaging of the knee was performed. No intravenous contrast was administered. COMPARISON:  None. FINDINGS: MENISCI Medial meniscus:  Intact. Lateral meniscus: Complex tear of the posterior and posteromedial corner of the lateral meniscus with mucoid degeneration. LIGAMENTS Cruciates:  Intact ACL and PCL. Collaterals: Medial collateral ligament is intact. Lateral collateral ligament complex is intact. CARTILAGE Patellofemoral:  No chondral defect. Medial: Mild partial-thickness cartilage loss of the medial femorotibial compartment involving the medial condylar  cartilage. Lateral:  Mild chondral thinning and minimal fissuring posteriorly. Joint: Small suprapatellar joint effusion. Normal Hoffa's fat pad. No plical thickening. Popliteal Fossa:  Small popliteal cyst.  Intact popliteus tendon. Extensor Mechanism:  Intact quadriceps tendon and patellar tendon. Bones: Small subcortical cysts of the posteromedial tibial epiphysis. No bone marrow edema or fracture. Other: None. IMPRESSION: Mucoid degeneration of the posterior horn of the lateral meniscus with complex tear in the posteromedial corner. Mild chondromalacia of the femorotibial cartilage. Intact cruciate and collateral ligaments. Electronically Signed   By: Ashley Royalty M.D.   On: 10/21/2016  01:40    Assessment & Plan:   Jonh was seen today for follow-up.  Diagnoses and all orders for this visit:  Grieving  Need for influenza vaccination -     Flu vaccine HIGH DOSE PF  Muscle strain of chest wall, initial encounter   I am having Gilman Schmidt maintain his aspirin, Multiple Vitamins-Minerals (CENTRUM SILVER PO), Fish Oil, cycloSPORINE, folic acid, fluticasone, vitamin B-12, ACCU-CHEK AVIVA PLUS, diclofenac sodium, ULTRAFLEX, ACCU-CHEK AVIVA PLUS, allopurinol, ACCU-CHEK PLASTIC CARTRIDGE, mometasone, insulin lispro, rosuvastatin, carvedilol, omeprazole, ACCU-CHEK FASTCLIX LANCETS, amitriptyline, HUMALOG, alfuzosin, and furosemide.  No orders of the defined types were placed in this encounter.  He will follow-up with me in 3 months.  He is seeing the endocrinologist in the first part of November for follow-up of his diabetes and his hypothyroidism.  Follow-up: Return in about 3 months (around 10/30/2018).  Libby Maw, MD

## 2018-08-09 ENCOUNTER — Encounter: Payer: Self-pay | Admitting: Endocrinology

## 2018-08-09 ENCOUNTER — Other Ambulatory Visit: Payer: Self-pay | Admitting: Endocrinology

## 2018-08-09 MED ORDER — ACCU-CHEK ULTRAFLEX INF SET MISC: 1.0000 | 3 refills | Status: DC

## 2018-08-12 ENCOUNTER — Other Ambulatory Visit: Payer: Self-pay | Admitting: Endocrinology

## 2018-08-13 ENCOUNTER — Encounter (INDEPENDENT_AMBULATORY_CARE_PROVIDER_SITE_OTHER): Payer: Self-pay | Admitting: Orthopaedic Surgery

## 2018-08-13 ENCOUNTER — Ambulatory Visit (INDEPENDENT_AMBULATORY_CARE_PROVIDER_SITE_OTHER): Payer: Medicare Other | Admitting: Orthopaedic Surgery

## 2018-08-13 DIAGNOSIS — M65341 Trigger finger, right ring finger: Secondary | ICD-10-CM | POA: Diagnosis not present

## 2018-08-13 MED ORDER — BUPIVACAINE HCL 0.25 % IJ SOLN
0.3300 mL | INTRAMUSCULAR | Status: AC | PRN
  Administered 2018-08-13: .33 mL

## 2018-08-13 MED ORDER — METHYLPREDNISOLONE ACETATE 40 MG/ML IJ SUSP
13.3300 mg | INTRAMUSCULAR | Status: AC | PRN
  Administered 2018-08-13: 13.33 mg

## 2018-08-13 MED ORDER — LIDOCAINE HCL 1 % IJ SOLN
1.0000 mL | INTRAMUSCULAR | Status: AC | PRN
  Administered 2018-08-13: 1 mL

## 2018-08-13 NOTE — Progress Notes (Signed)
Office Visit Note   Patient: [name]           Date of Birth: [birth year]           MRN: [id number] Visit Date: 08/13/2018              Requested by: Libby Maw, MD [address] [place], [place] [address] PCP: Libby Maw, MD   Assessment & Plan: Visit Diagnoses:  1. Trigger finger, right ring finger     Plan: Impression is right ring trigger finger.  Today, we will inject this with cortisone.  I did discuss definitive surgical treatment of A1 pulley release.  He will follow-up with [place] as needed.  Call with concerns or questions.  Follow-Up Instructions: Return if symptoms worsen or fail to improve.   Orders:  Orders Placed This Encounter  Procedures  . Hand/UE Inj: R ring A1   No orders of the defined types were placed in this encounter.     Procedures: Hand/UE Inj: R ring A1 for trigger finger on 08/13/2018 8:50 AM Indications: pain Details: 25 G needle Medications: 1 mL lidocaine 1 %; 0.33 mL bupivacaine 0.25 %; 13.33 mg methylPREDNISolone acetate 40 MG/ML      Clinical Data: No additional findings.   Subjective: Chief Complaint  Patient presents with  . Right Ring Finger - Pain    Trigger finger    HPI patient is a pleasant 77 year old gentleman who presents to our clinic today with pain and triggering to the right ring finger.  This is been ongoing for the past 2 months.  No known injury or change in activity.  The pain he has is to the A1 pulley.  The pain and triggering appear to be worse towards the end of the day.  Nothing seems to improve his symptoms.  No numbness, tingling or burning.  No previous trigger finger.  Review of Systems as detailed in HPI.  All others reviewed and are negative.   Objective: Vital Signs: There were no vitals taken for this visit.  Physical Exam well-developed well-nourished gentleman in no acute distress.  Alert and oriented x3.  Ortho Exam examination of the right ring finger  reveals a moderately tender and palpable nodule A1 pulley.  Marked triggering.  He is neurovascularly intact distally.  Specialty Comments:  No specialty comments available.  Imaging: No new imaging   PMFS History: Patient Active Problem List   Diagnosis Date Noted  . Trigger finger, right ring finger 08/13/2018  . Chest wall muscle strain 07/30/2018  . Need for influenza vaccination 07/30/2018  . Grieving 06/25/2018  . ETD (Eustachian tube dysfunction), left 06/25/2018  . Unilateral primary osteoarthritis, right knee 11/17/2016  . Complex tear of lateral meniscus of right knee as current injury 10/26/2016  . Radiculitis, lumbosacral 06/30/2016  . Diabetes ([place]) 01/01/2016  . Solitary pulmonary nodule 12/30/2015  . Urolithiasis 02/23/2015  . Hearing loss 11/25/2014  . Paresthesia 09/07/2014  . Routine general medical examination at a health care facility 07/07/2014  . Disturbance of skin sensation 12/26/2013  . UTI (urinary tract infection) 03/12/2013  . Screening for prostate cancer 02/14/2012  . Right knee pain 11/15/2011  . Encounter for long-term (current) use of other medications 01/17/2011  . Special screening examination for neoplasm of prostate 01/17/2011  . MYCOSIS FUNGOIDES 12/29/2009  . HEARING LOSS 09/30/2009  . ECZEMA 09/30/2009  . VITAMIN B12 DEFICIENCY 09/10/2009  . MORBID OBESITY 09/10/2009  . SYNCOPE 05/03/2009  . DIASTOLIC DYSFUNCTION  11/04/2008  . CARDIAC MURMUR, SYSTOLIC 82/80/0349  . Pancytopenia ([place]) 05/12/2008  . Disorder resulting from impaired renal function 05/12/2008  . EDEMA 05/12/2008  . Obstructive sleep apnea 11/04/2007  . BACK PAIN, LUMBAR 10/23/2007  . Dyslipidemia 05/20/2007  . 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 Essential hypertension 05/20/2007   Past Medical History:  Diagnosis Date  . Allergy   . Anal fissure   . Anemia, unspecified   . Arthritis    Spinal OA  . BACK PAIN, LUMBAR 10/23/2007  . CARDIAC MURMUR, SYSTOLIC 10/29/9148  .  DIABETES MELLITUS, TYPE I 05/20/2007  . DIASTOLIC DYSFUNCTION 5/69/7948  . 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[place])   . Duodenitis   . Edema 05/12/2008  . Esophageal stricture   . GERD (gastroesophageal reflux disease)   . 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 Gynecomastia   . Hepatic steatosis   . HYPERLIPIDEMIA 05/20/2007  . Hypertension   . Hypogonadism male   . Morbid obesity ([place]) 09/10/2009  . OBSTRUCTIVE SLEEP APNEA 11/04/2007  . Personal history of colonic polyps   . RENAL INSUFFICIENCY 05/12/2008  . Sleep apnea    uses cpap  . Urolithiasis     Family History  Problem Relation Age of Onset  . Colon cancer Brother 65  . Colon polyps Brother   . Cancer Brother        lung cancer, deceased  . Other Mother        MVA, deceased 81s  . Healthy Daughter   . Healthy Son   . Rectal cancer Neg Hx   . Stomach cancer Neg Hx     Past Surgical History:  Procedure Laterality Date  . Abdominal US  09/1997  . arm fracture Left 1958   with hardware  . Colon cancer screening    . COLONOSCOPY  08/18/2004   diverticulitis  . COLONOSCOPY  09/24/2009  . ELECTROCARDIOGRAM  02/2006  . FLEXIBLE SIGMOIDOSCOPY  03/04/2001   polyps, anal fissure  . GANGLION CYST EXCISION Left 1980  . Lower Arterial  04/13/2004  . POLYPECTOMY    . Rest Cardiolite  03/19/2003   Social History   Occupational History    Employer: [employer]  Tobacco Use  . Smoking status: Former Smoker    Packs/day: 2.00    Years: 31.00    Pack years: 62.00    Types: Cigarettes    Last attempt to quit: 10/23/1982    Years since quitting: 35.8  . Smokeless tobacco: Never Used  Substance and Sexual Activity  . Alcohol use: No    Alcohol/week: 0.0 standard drinks  . Drug use: No  . Sexual activity: Not on file

## 2018-08-14 ENCOUNTER — Other Ambulatory Visit: Payer: Self-pay

## 2018-08-14 MED ORDER — INSULIN LISPRO 100 UNIT/ML ~~LOC~~ SOLN: 110.0000 [IU] | Freq: Every day | SUBCUTANEOUS | 0 refills | Status: DC

## 2018-08-14 MED ORDER — INSULIN LISPRO 100 UNIT/ML ~~LOC~~ SOLN: SUBCUTANEOUS | 4 refills | Status: DC

## 2018-08-18 ENCOUNTER — Encounter: Payer: Self-pay | Admitting: Endocrinology

## 2018-08-19 ENCOUNTER — Other Ambulatory Visit: Payer: Self-pay | Admitting: Endocrinology

## 2018-08-19 MED ORDER — ACCU-CHEK PLASTIC CARTRIDGE MISC: 3 refills | Status: DC

## 2018-08-19 NOTE — Telephone Encounter (Signed)
Please advise if wish to resume this Rx since it had been previously discontinued.

## 2018-09-04 ENCOUNTER — Other Ambulatory Visit: Payer: Self-pay | Admitting: Endocrinology

## 2018-09-05 ENCOUNTER — Encounter: Payer: Medicare Other | Admitting: Physical Medicine & Rehabilitation

## 2018-09-09 ENCOUNTER — Ambulatory Visit: Payer: Medicare Other | Admitting: Endocrinology

## 2018-09-18 ENCOUNTER — Encounter: Payer: Self-pay | Admitting: Physical Medicine & Rehabilitation

## 2018-09-18 ENCOUNTER — Encounter: Payer: Medicare Other | Attending: Physical Medicine & Rehabilitation | Admitting: Physical Medicine & Rehabilitation

## 2018-09-18 ENCOUNTER — Other Ambulatory Visit: Payer: Self-pay

## 2018-09-18 VITALS — BP 129/83 | HR 78 | Ht 72.0 in | Wt 280.6 lb

## 2018-09-18 DIAGNOSIS — G8929 Other chronic pain: Secondary | ICD-10-CM

## 2018-09-18 DIAGNOSIS — M461 Sacroiliitis, not elsewhere classified: Secondary | ICD-10-CM | POA: Diagnosis not present

## 2018-09-18 DIAGNOSIS — Z9889 Other specified postprocedural states: Secondary | ICD-10-CM | POA: Diagnosis not present

## 2018-09-18 DIAGNOSIS — Z6838 Body mass index (BMI) 38.0-38.9, adult: Secondary | ICD-10-CM | POA: Insufficient documentation

## 2018-09-18 DIAGNOSIS — I129 Hypertensive chronic kidney disease with stage 1 through stage 4 chronic kidney disease, or unspecified chronic kidney disease: Secondary | ICD-10-CM | POA: Insufficient documentation

## 2018-09-18 DIAGNOSIS — E1142 Type 2 diabetes mellitus with diabetic polyneuropathy: Secondary | ICD-10-CM | POA: Diagnosis not present

## 2018-09-18 DIAGNOSIS — E1022 Type 1 diabetes mellitus with diabetic chronic kidney disease: Secondary | ICD-10-CM | POA: Insufficient documentation

## 2018-09-18 DIAGNOSIS — M791 Myalgia, unspecified site: Secondary | ICD-10-CM

## 2018-09-18 DIAGNOSIS — E1042 Type 1 diabetes mellitus with diabetic polyneuropathy: Secondary | ICD-10-CM | POA: Insufficient documentation

## 2018-09-18 DIAGNOSIS — N189 Chronic kidney disease, unspecified: Secondary | ICD-10-CM | POA: Diagnosis not present

## 2018-09-18 DIAGNOSIS — E785 Hyperlipidemia, unspecified: Secondary | ICD-10-CM | POA: Diagnosis not present

## 2018-09-18 DIAGNOSIS — G4733 Obstructive sleep apnea (adult) (pediatric): Secondary | ICD-10-CM | POA: Diagnosis not present

## 2018-09-18 DIAGNOSIS — M542 Cervicalgia: Secondary | ICD-10-CM | POA: Insufficient documentation

## 2018-09-18 DIAGNOSIS — Z87891 Personal history of nicotine dependence: Secondary | ICD-10-CM | POA: Diagnosis not present

## 2018-09-18 DIAGNOSIS — M545 Low back pain, unspecified: Secondary | ICD-10-CM

## 2018-09-18 MED ORDER — AMITRIPTYLINE HCL 25 MG PO TABS: 25.0000 mg | ORAL_TABLET | Freq: Every day | ORAL | 1 refills | Status: DC

## 2018-09-18 NOTE — Progress Notes (Signed)
Subjective:    Patient ID: [name], male    DOB: 07-10-1941, 77 y.o.   MRN: [id number]  HPI  Male with pmh of OSA, CKD, HTN, DM type 1 with peripheral neuropathy presents for follow up of low back pain > neck pain.   On first visit, pt complained of b/l L>R back pain.  Started ~>20 years ago.  No inciting event.  Sitting/laying improve the pain.  Standing exacerbates the pain.  Sharp, burning pain.  Radiates to left posterior thigh.  Intermittent.  He has associated numbness/tingling.  He only tried ASA, which helps some.  Pain limits from doing ADLs and fishing.  He denies falls.  Last clinic visit  05/03/18.  Since last visit, pt states he thinks he has been doing well.  He notes issues with refilling Elavil.  Pain Inventory Average Pain 3 Pain Right Now 4 My pain is burning  In the last 24 hours, has pain interfered with the following? General activity 3 Relation with others 0 Enjoyment of life 4 What TIME of day is your pain at its worst? daytime Sleep (in general) Good  Pain is worse with: walking, standing and some activites Pain improves with: rest, heat/ice and TENS Relief from Meds: na  Mobility walk without assistance ability to climb steps?  yes do you drive?  yes  Function retired  Neuro/Psych weakness numbness tingling  Prior Studies Any changes since last visit?  no  Physicians involved in your care Any changes since last visit?  no   Family History  Problem Relation Age of Onset  . Colon cancer Brother 51  . Colon polyps Brother   . Cancer Brother        lung cancer, deceased  . Other Mother        MVA, deceased 54s  . Healthy Daughter   . Healthy Son   . Rectal cancer Neg Hx   . Stomach cancer Neg Hx    Social History   Socioeconomic History  . Marital status: Married    Spouse name: Not on file  . Number of children: Not on file  . Years of education: Not on file  . Highest education level: Not on file  Occupational History   Employer: [employer]  . Financial resource strain: Not on file  . Food insecurity:    Worry: Not on file    Inability: Not on file  . Transportation needs:    Medical: Not on file    Non-medical: Not on file  Tobacco Use  . Smoking status: Former Smoker    Packs/day: 2.00    Years: 31.00    Pack years: 62.00    Types: Cigarettes    Last attempt to quit: 10/23/1982    Years since quitting: 35.9  . Smokeless tobacco: Never Used  Substance and Sexual Activity  . Alcohol use: No    Alcohol/week: 0.0 standard drinks  . Drug use: No  . Sexual activity: Not on file  Lifestyle  . Physical activity:    Days per week: Not on file    Minutes per session: Not on file  . Stress: Not on file  Relationships  . Social connections:    Talks on phone: Not on file    Gets together: Not on file    Attends religious service: Not on file    Active member of club or organization: Not on file    Attends meetings of clubs or organizations: Not  on file    Relationship status: Not on file  Other Topics Concern  . Not on file  Social History Narrative   Lives with wife in a one story home.  Has a son and a daughter.     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     Education: 2 years of college.   Past Surgical History:  Procedure Laterality Date  . Abdominal US  09/1997  . arm fracture Left 1958   with hardware  . Colon cancer screening    . COLONOSCOPY  08/18/2004   diverticulitis  . COLONOSCOPY  09/24/2009  . ELECTROCARDIOGRAM  02/2006  . FLEXIBLE SIGMOIDOSCOPY  03/04/2001   polyps, anal fissure  . GANGLION CYST EXCISION Left 1980  . Lower Arterial  04/13/2004  . POLYPECTOMY    . Rest Cardiolite  03/19/2003   Past Medical History:  Diagnosis Date  . Allergy   . Anal fissure   . Anemia, unspecified   . Arthritis    Spinal OA  . BACK PAIN, LUMBAR 10/23/2007  . CARDIAC MURMUR, SYSTOLIC 11/29/9483  . DIABETES MELLITUS, TYPE I 05/20/2007  . DIASTOLIC  DYSFUNCTION 4/62/7035  . 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[place])   . Duodenitis   . Edema 05/12/2008  . Esophageal stricture   . GERD (gastroesophageal reflux disease)   . 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 Gynecomastia   . Hepatic steatosis   . HYPERLIPIDEMIA 05/20/2007  . Hypertension   . Hypogonadism male   . Morbid obesity ([place]) 09/10/2009  . OBSTRUCTIVE SLEEP APNEA 11/04/2007  . Personal history of colonic polyps   . RENAL INSUFFICIENCY 05/12/2008  . Sleep apnea    uses cpap  . Urolithiasis    Ht 6' (1.829 m)   Wt 280 lb 9.6 oz (127.3 kg)   BMI 38.06 kg/m   Opioid Risk Score:   Fall Risk Score:  `1  Depression screen PHQ 2/9  Depression screen [facility] 2/9 09/18/2018 09/20/2017 08/31/2016  Decreased Interest 0 0 0  Down, Depressed, Hopeless 0 0 0  PHQ - 2 Score 0 0 0  Altered sleeping - - 0  Tired, decreased energy - - 1  Change in appetite - - 0  Feeling bad or failure about yourself  - - 0  Trouble concentrating - - 0  Moving slowly or fidgety/restless - - 0  Suicidal thoughts - - 0  PHQ-9 Score - - 1  Difficult doing work/chores - - Somewhat difficult  Some recent data might be hidden    Review of Systems  HENT: Negative.   Eyes: Negative.   Respiratory: Positive for apnea and shortness of breath.   Cardiovascular: Negative.   Gastrointestinal: Negative.   Endocrine:       Diabetic High blood sugar  Genitourinary: Negative.           Musculoskeletal: Positive for arthralgias, back pain, myalgias and neck pain.  Skin: Negative.   Allergic/Immunologic: Negative.   Neurological: Positive for weakness and numbness.       Tingling  Hematological: Negative.   All other systems reviewed and are negative.     Objective:   Physical Exam Gen: NAD. Vital signs reviewed HENT: Normocephalic, Atraumatic Eyes: EOMI. No discharge.  Cardio: RRR. No JVD. Pulm: B/l clear to auscultation.  Effort normal. Abd: Soft, BS+ MSK:  Gait WNL.   +Mild TTP right SI joint  No  edema.   ROM WNL Neuro: Alert  Strength  5/5 in all  LE myotomes Skin: Warm and Dry. Intact.     Assessment & Plan:  77 y/o male with pmh of OSA, CKD, HTN, DM type 1 with peripheral neuropathy presents for follow up of low back pain > neck pain.    1. Chronic mechanical low back pain  MRIs C,L-spine early 2016 suggesting multilevel spondylosis with mild b/l multilevel foraminal narrowing C4-C7 and shallow disc bulge at L5-S1 without central canal or foraminal stenosis.  Avoid NSAIDs due to CKD  Unable to tolerate Gabapentin, Lyrica  Voltaren gel denied by insurance   Cont HEP  Cont TENs  Aquatic therapy, not going anymore, believes he can just live with the pain and that he is lazy  Cont tylenol  Cont Robaxin 500 TID PRN, changed to Baclofen 10 TID PRN due to change insurance  Cont Cymbalta 60mg   Cont back brace during periods of excessive activity, reminded to avoid prolonged use  Cont Lidoderm OTC  Acknowledges he can do more if he make a conscious effort  2. Sacroiliitis  See #1  Cont brace  Not main pain generator at present  3. Sleep disturbance  Recently new mattress  Cont CPAP  Will increase Elavil to 25qhs  4. Myalgia  Good benefit with trigger point injections for a short period  No need to repeat at present  5. Diabetic neuropathy  See #1, #3  6. Morbid obesity  Not interested in seeing dietitian at this time  Discussed weight loss again

## 2018-09-26 ENCOUNTER — Ambulatory Visit: Payer: Medicare Other | Admitting: Endocrinology

## 2018-09-26 ENCOUNTER — Encounter: Payer: Self-pay | Admitting: Endocrinology

## 2018-09-26 VITALS — BP 130/68 | HR 80 | Ht 72.0 in | Wt 280.4 lb

## 2018-09-26 DIAGNOSIS — E0800 Diabetes mellitus due to underlying condition with hyperosmolarity without nonketotic hyperglycemic-hyperosmolar coma (NKHHC): Secondary | ICD-10-CM

## 2018-09-26 LAB — POCT GLYCOSYLATED HEMOGLOBIN (HGB A1C): HEMOGLOBIN A1C: 7.6 % — AB (ref 4.0–5.6)

## 2018-09-26 NOTE — Progress Notes (Signed)
Subjective:    Patient ID: [name], male    DOB: [birth year], 77 y.o.   MRN: [id number]  HPI Pt returns for f/u of diabetes mellitus: DM type: Insulin-requiring type 2 Dx'ed: 3825 Complications: polyneuropathy and renal insufficiency.  Therapy: insulin since 1995.  DKA: never.  Severe hypoglycemia: never.  Pancreatitis: never.  Other: he started pump therapy in mid-2015; he declines continuous glucose monitor.  Interval history: he takes these pump settings:   basal rate of 2 units/hr, 24 hrs per day.   mealtime bolus of 1 unit/3 grams carbohydrate.  However, he subtracts 15 units from your calculated lunch bolus.  continue correction bolus (which some people call "sensitivity," or "insulin sensitivity ratio," or just "isr") of 1 unit for each 10 by which your glucose exceeds 100.  Total daily insulin dosage: approx 130 units.   No recent steroids.  Meter is downloaded today, and the printout is scanned into the record.  cbg varies from 137-300's.  It is in general lowest in the afternoon.   Past Medical History:  Diagnosis Date  . Allergy   . Anal fissure   . Anemia, unspecified   . Arthritis    Spinal OA  . BACK PAIN, LUMBAR 10/23/2007  . CARDIAC MURMUR, SYSTOLIC 0/02/3975  . DIABETES MELLITUS, TYPE I 05/20/2007  . DIASTOLIC DYSFUNCTION 7/34/1937  . 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[place])   . Duodenitis   . Edema 05/12/2008  . Esophageal stricture   . GERD (gastroesophageal reflux disease)   . 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 Gynecomastia   . Hepatic steatosis   . HYPERLIPIDEMIA 05/20/2007  . Hypertension   . Hypogonadism male   . Morbid obesity ([place]) 09/10/2009  . OBSTRUCTIVE SLEEP APNEA 11/04/2007  . Personal history of colonic polyps   . RENAL INSUFFICIENCY 05/12/2008  . Sleep apnea    uses cpap  . Urolithiasis     Past Surgical History:  Procedure Laterality Date  . Abdominal US  09/1997  . arm fracture Left 1958   with hardware  . Colon cancer screening      . COLONOSCOPY  08/18/2004   diverticulitis  . COLONOSCOPY  09/24/2009  . ELECTROCARDIOGRAM  02/2006  . FLEXIBLE SIGMOIDOSCOPY  03/04/2001   polyps, anal fissure  . GANGLION CYST EXCISION Left 1980  . Lower Arterial  04/13/2004  . POLYPECTOMY    . Rest Cardiolite  03/19/2003    Social History   Socioeconomic History  . Marital status: Married    Spouse name: Not on file  . Number of children: Not on file  . Years of education: Not on file  . Highest education level: Not on file  Occupational History    Employer: [employer]  . Financial resource strain: Not on file  . Food insecurity:    Worry: Not on file    Inability: Not on file  . Transportation needs:    Medical: Not on file    Non-medical: Not on file  Tobacco Use  . Smoking status: Former Smoker    Packs/day: 2.00    Years: 31.00    Pack years: 62.00    Types: Cigarettes    Last attempt to quit: 10/23/1982    Years since quitting: 35.9  . Smokeless tobacco: Never Used  Substance and Sexual Activity  . Alcohol use: No    Alcohol/week: 0.0 standard drinks  . Drug use: No  . Sexual activity: Not on file  Lifestyle  .  Physical activity:    Days per week: Not on file    Minutes per session: Not on file  . Stress: Not on file  Relationships  . Social connections:    Talks on phone: Not on file    Gets together: Not on file    Attends religious service: Not on file    Active member of club or organization: Not on file    Attends meetings of clubs or organizations: Not on file    Relationship status: Not on file  . Intimate partner violence:    Fear of current or ex partner: Not on file    Emotionally abused: Not on file    Physically abused: Not on file    Forced sexual activity: Not on file  Other Topics Concern  . Not on file  Social History Narrative   Lives with wife in a one story home.  Has a son and a daughter.     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      Education: 2 years of college.    Current Outpatient Medications on File Prior to Visit  Medication Sig Dispense Refill  . ACCU-CHEK AVIVA PLUS test strip USE TO TEST BLOOD SUGAR 6  TIMES PER DAY. 600 each 1  . ACCU-CHEK FASTCLIX LANCETS MISC Used to check blood sugars up to 6x daily. 204 each 11  . alfuzosin (UROXATRAL) 10 MG 24 hr tablet TAKE 1 TABLET BY MOUTH  DAILY WITH BREAKFAST 90 tablet 2  . allopurinol (ZYLOPRIM) 300 MG tablet TAKE 1 TABLET BY MOUTH  DAILY 90 tablet 3  . amitriptyline (ELAVIL) 25 MG tablet Take 1 tablet (25 mg total) by mouth at bedtime. 90 tablet 1  . aspirin (BAYER LOW STRENGTH) 81 MG EC tablet Take 81 mg by mouth daily.      . Blood Glucose Monitoring Suppl (ACCU-CHEK AVIVA PLUS) w/Device KIT Use as directed 1 kit 0  . carvedilol (COREG) 3.125 MG tablet TAKE 1/2 TABLETS BY MOUTH 2 TIMES DAILY WITH MEALS. 90 tablet 4  . cycloSPORINE (RESTASIS) 0.05 % ophthalmic emulsion Place 1 drop into both eyes 2 (two) times daily.      . diclofenac sodium (VOLTAREN) 1 % GEL Apply 2 g topically 4 (four) times daily. 1 Tube 1  . fluticasone (FLONASE) 50 MCG/ACT nasal spray Place 1 spray into the nose daily as needed for allergies.     . folic acid (FOLVITE) 1 MG tablet Take 2 mg by mouth 2 (two) times daily. 4 tabs daily    . furosemide (LASIX) 20 MG tablet TAKE 1 TABLET BY MOUTH  DAILY 90 tablet 3  . Insulin Infusion Pump Supplies (ACCU-CHEK PLASTIC CARTRIDGE) MISC USE 1 CARTRIDGE EVERY 2 DAYS 45 each 3  . Insulin Infusion Pump Supplies (ULTRAFLEX) MISC 1 Device by Other route every other day. 45 each 3  . insulin lispro (HUMALOG) 100 UNIT/ML injection INJECT SUBCUTANEOUSLY FOR  USE IN PUMP FOR A TOTAL OF  110 UNITS PER DAY 40 mL 4  . insulin lispro (HUMALOG) 100 UNIT/ML injection Inject 1.1 mLs (110 Units total) into the skin daily. 100 mL 0  . mometasone (ELOCON) 0.1 % lotion APPLY TOPICALLY DAILY 60 mL 11  . Multiple Vitamins-Minerals (CENTRUM SILVER PO) Take 1 tablet by mouth  daily.      . Omega-3 Fatty Acids (FISH OIL) 1200 MG CAPS Take 1,200 mg by mouth daily.     Marland Kitchen omeprazole (PRILOSEC) 20 MG capsule TAKE  1 CAPSULE BY MOUTH  DAILY AS NEEDED 90 capsule 3  . rosuvastatin (CRESTOR) 40 MG tablet TAKE 1 TABLET BY MOUTH AT  BEDTIME 90 tablet 2  . vitamin B-12 (CYANOCOBALAMIN) 1000 MCG tablet Take 1,000 mcg by mouth daily.     No current facility-administered medications on file prior to visit.     Allergies  Allergen Reactions  . Atorvastatin Other (See Comments)    unknown  . Niacin     REACTION: Severe heartburn  . Pioglitazone     REACTION: Edema    Family History  Problem Relation Age of Onset  . Colon cancer Brother 74  . Colon polyps Brother   . Cancer Brother        lung cancer, deceased  . Other Mother        MVA, deceased 48s  . Healthy Daughter   . Healthy Son   . Rectal cancer Neg Hx   . Stomach cancer Neg Hx     BP 130/68 (BP Location: Right Arm, Patient Position: Sitting, Cuff Size: Large)   Pulse 80   Ht 6' (1.829 m)   Wt 280 lb 6.4 oz (127.2 kg)   SpO2 94%   BMI 38.03 kg/m   Review of Systems He denies hypoglycemia    Objective:   Physical Exam VITAL SIGNS:  See vs page GENERAL: no distress Pulses: foot pulses are intact bilaterally.   MSK: no deformity of the feet or ankles.  CV: trace bilat edema of the legs Skin:  no ulcer on the feet or ankles.  normal color and temp on the feet and ankles Neuro: sensation is intact to touch on the feet and ankles.    Lab Results  Component Value Date   HGBA1C 7.6 (A) 09/26/2018       Assessment & Plan:  Insulin-requiring type 2 DM, with polyneuropathy. this is the best control this pt should aim for, given variable cbg's Renal insuff: in this setting, he needs more bolus insulin than basal.  Patient Instructions  Please continue these pump settings:  basal rate of 2 units/hr, 24 hrs per day.   mealtime bolus of 1 unit/3 grams carbohydrate.  However, subtract 15 units from  your calculated lunch bolus.  continue correction bolus (which some people call "sensitivity," or "insulin sensitivity ratio," or just "isr") of 1 unit for each 10 by which your glucose exceeds 100. Please come back for a follow-up appointment in 4 months.

## 2018-09-26 NOTE — Patient Instructions (Signed)
Please continue these pump settings:  basal rate of 2 units/hr, 24 hrs per day.   mealtime bolus of 1 unit/3 grams carbohydrate.  However, subtract 15 units from your calculated lunch bolus.  continue correction bolus (which some people call "sensitivity," or "insulin sensitivity ratio," or just "isr") of 1 unit for each 10 by which your glucose exceeds 100. Please come back for a follow-up appointment in 4 months.

## 2018-09-30 ENCOUNTER — Other Ambulatory Visit: Payer: Self-pay

## 2018-09-30 DIAGNOSIS — E0800 Diabetes mellitus due to underlying condition with hyperosmolarity without nonketotic hyperglycemic-hyperosmolar coma (NKHHC): Secondary | ICD-10-CM

## 2018-09-30 MED ORDER — ACCU-CHEK PLASTIC CARTRIDGE MISC: 3 refills | Status: DC

## 2018-09-30 NOTE — Progress Notes (Signed)
Received notification from [facility] re: pt request to refill Accu Chek cartridge. Request authorized and refill sent as requested.

## 2018-10-04 ENCOUNTER — Other Ambulatory Visit: Payer: Self-pay

## 2018-10-04 DIAGNOSIS — E0800 Diabetes mellitus due to underlying condition with hyperosmolarity without nonketotic hyperglycemic-hyperosmolar coma (NKHHC): Secondary | ICD-10-CM

## 2018-10-04 MED ORDER — GLUCOSE BLOOD VI STRP: ORAL_STRIP | 11 refills | Status: AC

## 2018-10-08 ENCOUNTER — Encounter: Payer: Self-pay | Admitting: Endocrinology

## 2018-10-10 ENCOUNTER — Encounter: Payer: Self-pay | Admitting: Endocrinology

## 2018-10-10 ENCOUNTER — Other Ambulatory Visit: Payer: Self-pay

## 2018-10-10 DIAGNOSIS — E0800 Diabetes mellitus due to underlying condition with hyperosmolarity without nonketotic hyperglycemic-hyperosmolar coma (NKHHC): Secondary | ICD-10-CM

## 2018-10-10 MED ORDER — ACCU-CHEK PLASTIC CARTRIDGE MISC: 3 refills | Status: DC

## 2018-10-25 ENCOUNTER — Ambulatory Visit: Payer: Medicare Other | Admitting: Nurse Practitioner

## 2018-10-25 ENCOUNTER — Encounter: Payer: Self-pay | Admitting: Nurse Practitioner

## 2018-10-25 VITALS — BP 142/76 | HR 64 | Temp 97.6°F | Ht 72.0 in | Wt 283.0 lb

## 2018-10-25 DIAGNOSIS — R300 Dysuria: Secondary | ICD-10-CM

## 2018-10-25 DIAGNOSIS — Z87438 Personal history of other diseases of male genital organs: Secondary | ICD-10-CM | POA: Diagnosis not present

## 2018-10-25 LAB — POCT URINALYSIS DIPSTICK
Bilirubin, UA: NEGATIVE
Blood, UA: NEGATIVE
Glucose, UA: NEGATIVE
Ketones, UA: NEGATIVE
NITRITE UA: NEGATIVE
PH UA: 6 (ref 5.0–8.0)
PROTEIN UA: POSITIVE — AB
Spec Grav, UA: 1.025 (ref 1.010–1.025)
UROBILINOGEN UA: 0.2 U/dL

## 2018-10-25 MED ORDER — CIPROFLOXACIN HCL 500 MG PO TABS: 500.0000 mg | ORAL_TABLET | Freq: Two times a day (BID) | ORAL | 0 refills | Status: DC

## 2018-10-25 NOTE — Progress Notes (Signed)
Subjective:  Patient ID: [name], male    DOB: [birth year]  Age: 78 y.o. MRN: [id number]  CC: Urinary Tract Infection (burn when urinate, frequent urination, started 3- 4 days/ hx prostate problem)  Dysuria   This is a new problem. The current episode started in the past 7 days. The problem occurs every urination. The problem has been unchanged. The quality of the pain is described as burning. There has been no fever. He is not sexually active. There is no history of pyelonephritis. Associated symptoms include frequency and urgency. Pertinent negatives include no chills, discharge, flank pain, hematuria, hesitancy or nausea. He has tried increased fluids for the symptoms. The treatment provided no relief. His past medical history is significant for urinary stasis.  no rectal pain, no pain with BM last uti 63yrago. Hx of prostatitis, followed by urology annually  Home glucose:200s due to current use of ophthalmic prednisone in last 2weeks.  Reviewed past Medical, Social and Family history today.  Outpatient Medications Prior to Visit  Medication Sig Dispense Refill  . ACCU-CHEK FASTCLIX LANCETS MISC Used to check blood sugars up to 6x daily. 204 each 11  . alfuzosin (UROXATRAL) 10 MG 24 hr tablet TAKE 1 TABLET BY MOUTH  DAILY WITH BREAKFAST 90 tablet 2  . allopurinol (ZYLOPRIM) 300 MG tablet TAKE 1 TABLET BY MOUTH  DAILY 90 tablet 3  . amitriptyline (ELAVIL) 25 MG tablet Take 1 tablet (25 mg total) by mouth at bedtime. 90 tablet 1  . aspirin (BAYER LOW STRENGTH) 81 MG EC tablet Take 81 mg by mouth daily.      . Blood Glucose Monitoring Suppl (ACCU-CHEK AVIVA PLUS) w/Device KIT Use as directed 1 kit 0  . carvedilol (COREG) 3.125 MG tablet TAKE 1/2 TABLETS BY MOUTH 2 TIMES DAILY WITH MEALS. 90 tablet 4  . cycloSPORINE (RESTASIS) 0.05 % ophthalmic emulsion Place 1 drop into both eyes 2 (two) times daily.      . diclofenac sodium (VOLTAREN) 1 % GEL Apply 2 g topically 4 (four) times daily. 1  Tube 1  . fluticasone (FLONASE) 50 MCG/ACT nasal spray Place 1 spray into the nose daily as needed for allergies.     . folic acid (FOLVITE) 1 MG tablet Take 2 mg by mouth 2 (two) times daily. 4 tabs daily    . furosemide (LASIX) 20 MG tablet TAKE 1 TABLET BY MOUTH  DAILY 90 tablet 3  . glucose blood (ACCU-CHEK AVIVA PLUS) test strip USE TO TEST BLOOD SUGAR 6  TIMES PER DAY; E11.9 600 each 11  . Insulin Infusion Pump Supplies (ACCU-CHEK PLASTIC CARTRIDGE) MISC USE 1 CARTRIDGE EVERY 2 DAYS 45 each 3  . Insulin Infusion Pump Supplies (ULTRAFLEX) MISC 1 Device by Other route every other day. 45 each 3  . insulin lispro (HUMALOG) 100 UNIT/ML injection INJECT SUBCUTANEOUSLY FOR  USE IN PUMP FOR A TOTAL OF  110 UNITS PER DAY 40 mL 4  . insulin lispro (HUMALOG) 100 UNIT/ML injection Inject 1.1 mLs (110 Units total) into the skin daily. 100 mL 0  . mometasone (ELOCON) 0.1 % lotion APPLY TOPICALLY DAILY 60 mL 11  . Multiple Vitamins-Minerals (CENTRUM SILVER PO) Take 1 tablet by mouth daily.      . Omega-3 Fatty Acids (FISH OIL) 1200 MG CAPS Take 1,200 mg by mouth daily.     .Marland Kitchenomeprazole (PRILOSEC) 20 MG capsule TAKE 1 CAPSULE BY MOUTH  DAILY AS NEEDED 90 capsule 3  . rosuvastatin (CRESTOR) 40 MG  tablet TAKE 1 TABLET BY MOUTH AT  BEDTIME 90 tablet 2  . vitamin B-12 (CYANOCOBALAMIN) 1000 MCG tablet Take 1,000 mcg by mouth daily.     No facility-administered medications prior to visit.     ROS See HPI  Objective:  BP (!) 142/76   Pulse 64   Temp 97.6 F (36.4 C) (Oral)   Ht 6' (1.829 m)   Wt 283 lb (128.4 kg)   SpO2 98%   BMI 38.38 kg/m   BP Readings from Last 3 Encounters:  10/25/18 (!) 142/76  09/26/18 130/68  09/18/18 129/83    Wt Readings from Last 3 Encounters:  10/25/18 283 lb (128.4 kg)  09/26/18 280 lb 6.4 oz (127.2 kg)  09/18/18 280 lb 9.6 oz (127.3 kg)    Physical Exam Constitutional:      Appearance: He is obese.  Cardiovascular:     Rate and Rhythm: Normal rate.    Pulmonary:     Effort: Pulmonary effort is normal.  Abdominal:     Tenderness: There is no right CVA tenderness or left CVA tenderness.  Neurological:     Mental Status: He is alert.  Psychiatric:        Mood and Affect: Mood normal.        Behavior: Behavior normal.     Lab Results  Component Value Date   WBC 4.7 05/09/2018   HGB 14.7 05/09/2018   HCT 43.6 05/09/2018   PLT 126.0 (L) 05/09/2018   GLUCOSE 214 (H) 05/09/2018   CHOL 127 05/09/2018   TRIG 170.0 (H) 05/09/2018   HDL 34.30 (L) 05/09/2018   LDLDIRECT 64.9 12/29/2009   LDLCALC 59 05/09/2018   ALT 23 05/09/2018   AST 17 05/09/2018   NA 140 05/09/2018   K 3.6 05/09/2018   CL 101 05/09/2018   CREATININE 1.47 05/09/2018   BUN 25 (H) 05/09/2018   CO2 31 05/09/2018   TSH 5.91 (H) 05/09/2018   PSA 3.08 05/09/2018   HGBA1C 7.6 (A) 09/26/2018   MICROALBUR 2.2 (H) 05/09/2018    Mr Knee Right W/o Contrast  Result Date: 10/21/2016 CLINICAL DATA:  Right knee injury 20 years ago but now experiencing joint stiffness and buckling of the knee. Pressure and pain anteriorly. EXAM: MRI OF THE RIGHT KNEE WITHOUT CONTRAST TECHNIQUE: Multiplanar, multisequence MR imaging of the knee was performed. No intravenous contrast was administered. COMPARISON:  None. FINDINGS: MENISCI Medial meniscus:  Intact. Lateral meniscus: Complex tear of the posterior and posteromedial corner of the lateral meniscus with mucoid degeneration. LIGAMENTS Cruciates:  Intact ACL and PCL. Collaterals: Medial collateral ligament is intact. Lateral collateral ligament complex is intact. CARTILAGE Patellofemoral:  No chondral defect. Medial: Mild partial-thickness cartilage loss of the medial femorotibial compartment involving the medial condylar cartilage. Lateral:  Mild chondral thinning and minimal fissuring posteriorly. Joint: Small suprapatellar joint effusion. Normal Hoffa's fat pad. No plical thickening. Popliteal Fossa:  Small popliteal cyst.  Intact  popliteus tendon. Extensor Mechanism:  Intact quadriceps tendon and patellar tendon. Bones: Small subcortical cysts of the posteromedial tibial epiphysis. No bone marrow edema or fracture. Other: None. IMPRESSION: Mucoid degeneration of the posterior horn of the lateral meniscus with complex tear in the posteromedial corner. Mild chondromalacia of the femorotibial cartilage. Intact cruciate and collateral ligaments. Electronically Signed   By: Ashley Royalty M.D.   On: 10/21/2016 01:40    Assessment & Plan:   Tyrez was seen today for urinary tract infection.  Diagnoses and all orders for this visit:  Dysuria -     POCT urinalysis dipstick -     Urine Culture -     ciprofloxacin (CIPRO) 500 MG tablet; Take 1 tablet (500 mg total) by mouth 2 (two) times daily.  Hx of prostatitis   I am having Gilman Schmidt start on ciprofloxacin. I am also having him maintain his aspirin, Multiple Vitamins-Minerals (CENTRUM SILVER PO), Fish Oil, cycloSPORINE, folic acid, fluticasone, vitamin B-12, ACCU-CHEK AVIVA PLUS, diclofenac sodium, mometasone, carvedilol, omeprazole, ACCU-CHEK FASTCLIX LANCETS, alfuzosin, furosemide, ULTRAFLEX, allopurinol, rosuvastatin, insulin lispro, insulin lispro, amitriptyline, glucose blood, and ACCU-CHEK PLASTIC CARTRIDGE.  Meds ordered this encounter  Medications  . ciprofloxacin (CIPRO) 500 MG tablet    Sig: Take 1 tablet (500 mg total) by mouth 2 (two) times daily.    Dispense:  6 tablet    Refill:  0    Order Specific Question:   Supervising Provider    Answer:   Lucille Passy [[id number]]    Problem List Items Addressed This Visit    None    Visit Diagnoses    Dysuria    -  Primary   Relevant Medications   ciprofloxacin (CIPRO) 500 MG tablet   Other Relevant Orders   POCT urinalysis dipstick (Completed)   Urine Culture   Hx of prostatitis           Follow-up: Return if symptoms worsen or fail to improve.  Wilfred Lacy, NP

## 2018-10-25 NOTE — Patient Instructions (Signed)
You will be contacted with urine culture results.  Continue to maintain adequate oral hydration.  Start cipro with food.

## 2018-10-26 LAB — URINE CULTURE
MICRO NUMBER:: 8708
RESULT: NO GROWTH
SPECIMEN QUALITY:: ADEQUATE

## 2018-10-29 ENCOUNTER — Encounter: Payer: Self-pay | Admitting: Family Medicine

## 2018-10-29 ENCOUNTER — Ambulatory Visit: Payer: Medicare Other | Admitting: Family Medicine

## 2018-10-29 VITALS — BP 128/60 | HR 86 | Temp 97.8°F | Ht 72.0 in | Wt 283.0 lb

## 2018-10-29 DIAGNOSIS — F4321 Adjustment disorder with depressed mood: Secondary | ICD-10-CM

## 2018-10-29 DIAGNOSIS — H6122 Impacted cerumen, left ear: Secondary | ICD-10-CM | POA: Insufficient documentation

## 2018-10-29 DIAGNOSIS — M2669 Other specified disorders of temporomandibular joint: Secondary | ICD-10-CM

## 2018-10-29 DIAGNOSIS — M26649 Arthritis of unspecified temporomandibular joint: Secondary | ICD-10-CM | POA: Insufficient documentation

## 2018-10-29 NOTE — Progress Notes (Addendum)
Established Patient Office Visit  Subjective:  Patient ID: [name], male    DOB: 1941-04-29  Age: 78 y.o. MRN: [id number]  CC:  Chief Complaint  Patient presents with  . Follow-up    HPI [name] presents for follow-up of his urethritis symptoms that responded to 3 days of Cipro with an essentially negative UA and follow-up culture.  He is uncircumcised but was having no no difficulties with foreskin retraction erythema swelling or discharge.  He has no issues with urine flow nocturia hesitancy.  He has intermittent left ear pain.  Hearing is stable but decreased.  He was in a long for smoke for many years and exposed to loud noises.  He does use a CPAP machine and his mask is poor fitting.  He has to tighten his mask greatly to ensure fit.  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  Patient's daughter's estate is essentially settled.  He is further along in the grieving process then his wife.  Past Medical History:  Diagnosis Date  . Allergy   . Anal fissure   . Anemia, unspecified   . Arthritis    Spinal OA  . BACK PAIN, LUMBAR 10/23/2007  . CARDIAC MURMUR, SYSTOLIC 12/23/1222  . DIABETES MELLITUS, TYPE I 05/20/2007  . DIASTOLIC DYSFUNCTION 06/16/36  . 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 Duodenitis   . Edema 05/12/2008  . Esophageal stricture   . GERD (gastroesophageal reflux disease)   . 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 Gynecomastia   . Hepatic steatosis   . HYPERLIPIDEMIA 05/20/2007  . Hypertension   . Hypogonadism male   . Morbid obesity ([place]) 09/10/2009  . OBSTRUCTIVE SLEEP APNEA 11/04/2007  . Personal history of colonic polyps   . RENAL INSUFFICIENCY 05/12/2008  . Sleep apnea    uses cpap  . Urolithiasis     Past Surgical History:  Procedure Laterality Date  . Abdominal US  09/1997  . arm fracture Left 1958   with hardware  . Colon cancer screening    . COLONOSCOPY  08/18/2004   diverticulitis  . COLONOSCOPY  09/24/2009  . ELECTROCARDIOGRAM   02/2006  . FLEXIBLE SIGMOIDOSCOPY  03/04/2001   polyps, anal fissure  . GANGLION CYST EXCISION Left 1980  . Lower Arterial  04/13/2004  . POLYPECTOMY    . Rest Cardiolite  03/19/2003    Family History  Problem Relation Age of Onset  . Colon cancer Brother 21  . Colon polyps Brother   . Cancer Brother        lung cancer, deceased  . Other Mother        MVA, deceased 68s  . Healthy Daughter   . Healthy Son   . Rectal cancer Neg Hx   . Stomach cancer Neg Hx     Social History   Socioeconomic History  . Marital status: Married    Spouse name: Not on file  . Number of children: Not on file  . Years of education: Not on file  . Highest education level: Not on file  Occupational History    Employer: [employer]  . Financial resource strain: Not on file  . Food insecurity:    Worry: Not on file    Inability: Not on file  . Transportation needs:    Medical: Not on file    Non-medical: Not on file  Tobacco Use  . Smoking status: Former Smoker    Packs/day: 2.00  Years: 31.00    Pack years: 62.00    Types: Cigarettes    Last attempt to quit: 10/23/1982    Years since quitting: 36.0  . Smokeless tobacco: Never Used  Substance and Sexual Activity  . Alcohol use: No    Alcohol/week: 0.0 standard drinks  . Drug use: No  . Sexual activity: Not on file  Lifestyle  . Physical activity:    Days per week: Not on file    Minutes per session: Not on file  . Stress: Not on file  Relationships  . Social connections:    Talks on phone: Not on file    Gets together: Not on file    Attends religious service: Not on file    Active member of club or organization: Not on file    Attends meetings of clubs or organizations: Not on file    Relationship status: Not on file  . Intimate partner violence:    Fear of current or ex partner: Not on file    Emotionally abused: Not on file    Physically abused: Not on file    Forced sexual activity: Not on file  Other  Topics Concern  . Not on file  Social History Narrative   Lives with wife in a one story home.  Has a son and a daughter.     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     Education: 2 years of college.    Outpatient Medications Prior to Visit  Medication Sig Dispense Refill  . ACCU-CHEK FASTCLIX LANCETS MISC Used to check blood sugars up to 6x daily. 204 each 11  . alfuzosin (UROXATRAL) 10 MG 24 hr tablet TAKE 1 TABLET BY MOUTH  DAILY WITH BREAKFAST 90 tablet 2  . allopurinol (ZYLOPRIM) 300 MG tablet TAKE 1 TABLET BY MOUTH  DAILY 90 tablet 3  . amitriptyline (ELAVIL) 25 MG tablet Take 1 tablet (25 mg total) by mouth at bedtime. 90 tablet 1  . aspirin (BAYER LOW STRENGTH) 81 MG EC tablet Take 81 mg by mouth daily.      . Blood Glucose Monitoring Suppl (ACCU-CHEK AVIVA PLUS) w/Device KIT Use as directed 1 kit 0  . carvedilol (COREG) 3.125 MG tablet TAKE 1/2 TABLETS BY MOUTH 2 TIMES DAILY WITH MEALS. 90 tablet 4  . ciprofloxacin (CIPRO) 500 MG tablet Take 1 tablet (500 mg total) by mouth 2 (two) times daily. 6 tablet 0  . cycloSPORINE (RESTASIS) 0.05 % ophthalmic emulsion Place 1 drop into both eyes 2 (two) times daily.      . diclofenac sodium (VOLTAREN) 1 % GEL Apply 2 g topically 4 (four) times daily. 1 Tube 1  . fluticasone (FLONASE) 50 MCG/ACT nasal spray Place 1 spray into the nose daily as needed for allergies.     . folic acid (FOLVITE) 1 MG tablet Take 2 mg by mouth 2 (two) times daily. 4 tabs daily    . furosemide (LASIX) 20 MG tablet TAKE 1 TABLET BY MOUTH  DAILY 90 tablet 3  . glucose blood (ACCU-CHEK AVIVA PLUS) test strip USE TO TEST BLOOD SUGAR 6  TIMES PER DAY; E11.9 600 each 11  . Insulin Infusion Pump Supplies (ACCU-CHEK PLASTIC CARTRIDGE) MISC USE 1 CARTRIDGE EVERY 2 DAYS 45 each 3  . Insulin Infusion Pump Supplies (ULTRAFLEX) MISC 1 Device by Other route every other day. 45 each 3  . insulin lispro (HUMALOG) 100 UNIT/ML injection INJECT SUBCUTANEOUSLY FOR   USE IN PUMP FOR  A TOTAL OF  110 UNITS PER DAY 40 mL 4  . insulin lispro (HUMALOG) 100 UNIT/ML injection Inject 1.1 mLs (110 Units total) into the skin daily. 100 mL 0  . mometasone (ELOCON) 0.1 % lotion APPLY TOPICALLY DAILY 60 mL 11  . Multiple Vitamins-Minerals (CENTRUM SILVER PO) Take 1 tablet by mouth daily.      . Omega-3 Fatty Acids (FISH OIL) 1200 MG CAPS Take 1,200 mg by mouth daily.     Marland Kitchen omeprazole (PRILOSEC) 20 MG capsule TAKE 1 CAPSULE BY MOUTH  DAILY AS NEEDED 90 capsule 3  . rosuvastatin (CRESTOR) 40 MG tablet TAKE 1 TABLET BY MOUTH AT  BEDTIME 90 tablet 2  . vitamin B-12 (CYANOCOBALAMIN) 1000 MCG tablet Take 1,000 mcg by mouth daily.     No facility-administered medications prior to visit.     Allergies  Allergen Reactions  . Atorvastatin Other (See Comments)    unknown  . Niacin     REACTION: Severe heartburn  . Pioglitazone     REACTION: Edema    ROS Review of Systems  Constitutional: Negative for appetite change, chills, diaphoresis, fatigue, fever and unexpected weight change.  HENT: Positive for dental problem, ear pain and hearing loss. Negative for congestion, ear discharge, postnasal drip, rhinorrhea, sinus pressure, sinus pain and sneezing.   Eyes: Negative for photophobia and visual disturbance.  Respiratory: Negative.   Cardiovascular: Negative.   Gastrointestinal: Negative.   Endocrine: Negative for polyphagia and polyuria.  Genitourinary: Negative for difficulty urinating, discharge, frequency, hematuria, penile swelling and urgency.  Allergic/Immunologic: Negative for immunocompromised state.  Neurological: Negative for light-headedness and numbness.  Hematological: Negative.   Psychiatric/Behavioral: Negative.  Negative for self-injury and suicidal ideas. The patient is not nervous/anxious.       Objective:    Physical Exam  Constitutional: He is oriented to person, place, and time. He appears well-developed and well-nourished. No distress.    HENT:  Head: Normocephalic and atraumatic.    Right Ear: External ear normal. Tympanic membrane is scarred. Tympanic membrane is not perforated, not erythematous and not retracted. No middle ear effusion.  Left Ear: External ear normal. A foreign body is present. Tympanic membrane is not injected, not perforated, not erythematous and not retracted.  No middle ear effusion.  Ears:  Mouth/Throat: Oropharynx is clear and moist.  Eyes: Conjunctivae are normal. Right eye exhibits no discharge. Left eye exhibits no discharge. No scleral icterus.  Neck: No JVD present. No tracheal deviation present.  Pulmonary/Chest: Effort normal. No stridor.  Neurological: He is alert and oriented to person, place, and time.  Skin: Skin is warm and dry. He is not diaphoretic.  Psychiatric: He has a normal mood and affect. His behavior is normal.    BP 128/60   Pulse 86   Temp 97.8 F (36.6 C) (Oral)   Ht 6' (1.829 m)   Wt 283 lb (128.4 kg)   SpO2 97%   BMI 38.38 kg/m  Wt Readings from Last 3 Encounters:  10/29/18 283 lb (128.4 kg)  10/25/18 283 lb (128.4 kg)  09/26/18 280 lb 6.4 oz (127.2 kg)   BP Readings from Last 3 Encounters:  10/29/18 128/60  10/25/18 (!) 142/76  09/26/18 130/68   Guideline developer:  UpToDate (see UpToDate for funding source) Date Released: June 2014  There are no preventive care reminders to display for this patient.  There are no preventive care reminders to display for this patient.  Lab Results  Component Value Date  TSH 5.91 (H) 05/09/2018   Lab Results  Component Value Date   WBC 4.7 05/09/2018   HGB 14.7 05/09/2018   HCT 43.6 05/09/2018   MCV 91.5 05/09/2018   PLT 126.0 (L) 05/09/2018   Lab Results  Component Value Date   NA 140 05/09/2018   K 3.6 05/09/2018   CO2 31 05/09/2018   GLUCOSE 214 (H) 05/09/2018   BUN 25 (H) 05/09/2018   CREATININE 1.47 05/09/2018   BILITOT 0.6 05/09/2018   ALKPHOS 82 05/09/2018   AST 17 05/09/2018   ALT 23  05/09/2018   PROT 6.5 05/09/2018   ALBUMIN 3.9 05/09/2018   CALCIUM 8.9 05/09/2018   ANIONGAP 8 11/28/2014   GFR 49.43 (L) 05/09/2018   Lab Results  Component Value Date   CHOL 127 05/09/2018   Lab Results  Component Value Date   HDL 34.30 (L) 05/09/2018   Lab Results  Component Value Date   LDLCALC 59 05/09/2018   Lab Results  Component Value Date   TRIG 170.0 (H) 05/09/2018   Lab Results  Component Value Date   CHOLHDL 4 05/09/2018   Lab Results  Component Value Date   HGBA1C 7.6 (A) 09/26/2018      Assessment & Plan:   Problem List Items Addressed This Visit      Nervous and Auditory   Excessive cerumen in left ear canal     Musculoskeletal and Integument   TMJ arthritis - Primary     Other   Grieving      No orders of the defined types were placed in this encounter.   Follow-up: Return in about 6 months (around 04/29/2019), or if symptoms worsen or fail to improve.   Patient will follow-up with his dentist for possible mouthguard fitting.  He will see pulmonology for a mask fitting in a few weeks.  I will be assuming care for his health maintenance save his diabetes care that will continue per Dr. Ebony Hail.  I spent 20 minutes with this patient discussing his daughters late death and its impact family.  His left ear was also irrigated to remove cerumen.

## 2018-10-29 NOTE — Patient Instructions (Signed)

## 2018-10-30 ENCOUNTER — Ambulatory Visit: Payer: Medicare Other | Admitting: Family Medicine

## 2018-12-25 ENCOUNTER — Ambulatory Visit: Payer: Medicare Other | Admitting: Endocrinology

## 2018-12-25 ENCOUNTER — Encounter: Payer: Self-pay | Admitting: Endocrinology

## 2018-12-25 VITALS — BP 132/64 | HR 83 | Ht 72.0 in | Wt 280.8 lb

## 2018-12-25 DIAGNOSIS — E0800 Diabetes mellitus due to underlying condition with hyperosmolarity without nonketotic hyperglycemic-hyperosmolar coma (NKHHC): Secondary | ICD-10-CM | POA: Diagnosis not present

## 2018-12-25 LAB — POCT GLYCOSYLATED HEMOGLOBIN (HGB A1C): HEMOGLOBIN A1C: 8.4 % — AB (ref 4.0–5.6)

## 2018-12-25 MED ORDER — FREESTYLE LIBRE 14 DAY SENSOR MISC: 1.0000 | 3 refills | Status: DC

## 2018-12-25 NOTE — Progress Notes (Signed)
Subjective:    Patient ID: [name], male    DOB: 04-21-1941, 78 y.o.   MRN: [id number]  HPI Pt returns for f/u of diabetes mellitus: DM type: Insulin-requiring type 2 Dx'ed: 7622 Complications: polyneuropathy and renal insufficiency.  Therapy: insulin since 1995.  DKA: never.  Severe hypoglycemia: never.  Pancreatitis: never.  Other: he started pump therapy in mid-2015; he declines continuous glucose monitor.   Interval history: he takes these pump settings:   basal rate of 2 units/hr, 24 hrs per day.   mealtime bolus of 1 unit/2.7 grams carbohydrate.  However, he subtracts 15 units from your calculated lunch bolus.  continue correction bolus (which some people call "sensitivity," or "insulin sensitivity ratio," or just "isr") of 1 unit for each 10 by which your glucose exceeds 100.   Total daily insulin dosage: approx 120 units.   No recent steroids.  Meter is downloaded today, and the printout is scanned into the record.  cbg is checked qid.  It varies from 120-200's.  It is in general higher PC than AC. Past Medical History:  Diagnosis Date  . Allergy   . Anal fissure   . Anemia, unspecified   . Arthritis    Spinal OA  . BACK PAIN, LUMBAR 10/23/2007  . CARDIAC MURMUR, SYSTOLIC 03/25/3353  . DIABETES MELLITUS, TYPE I 05/20/2007  . DIASTOLIC DYSFUNCTION 5/62/5638  . 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 Duodenitis   . Edema 05/12/2008  . Esophageal stricture   . GERD (gastroesophageal reflux disease)   . 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 Gynecomastia   . Hepatic steatosis   . HYPERLIPIDEMIA 05/20/2007  . Hypertension   . Hypogonadism male   . Morbid obesity ([place]) 09/10/2009  . OBSTRUCTIVE SLEEP APNEA 11/04/2007  . Personal history of colonic polyps   . RENAL INSUFFICIENCY 05/12/2008  . Sleep apnea    uses cpap  . Urolithiasis     Past Surgical History:  Procedure Laterality Date  . Abdominal US  09/1997  . arm fracture Left 1958   with hardware  . Colon  cancer screening    . COLONOSCOPY  08/18/2004   diverticulitis  . COLONOSCOPY  09/24/2009  . ELECTROCARDIOGRAM  02/2006  . FLEXIBLE SIGMOIDOSCOPY  03/04/2001   polyps, anal fissure  . GANGLION CYST EXCISION Left 1980  . Lower Arterial  04/13/2004  . POLYPECTOMY    . Rest Cardiolite  03/19/2003    Social History   Socioeconomic History  . Marital status: Married    Spouse name: Not on file  . Number of children: Not on file  . Years of education: Not on file  . Highest education level: Not on file  Occupational History    Employer: [employer]  . Financial resource strain: Not on file  . Food insecurity:    Worry: Not on file    Inability: Not on file  . Transportation needs:    Medical: Not on file    Non-medical: Not on file  Tobacco Use  . Smoking status: Former Smoker    Packs/day: 2.00    Years: 31.00    Pack years: 62.00    Types: Cigarettes    Last attempt to quit: 10/23/1982    Years since quitting: 36.2  . Smokeless tobacco: Never Used  Substance and Sexual Activity  . Alcohol use: No    Alcohol/week: 0.0 standard drinks  . Drug use: No  . Sexual activity: Not on file  Lifestyle  . Physical activity:    Days per week: Not on file    Minutes per session: Not on file  . Stress: Not on file  Relationships  . Social connections:    Talks on phone: Not on file    Gets together: Not on file    Attends religious service: Not on file    Active member of club or organization: Not on file    Attends meetings of clubs or organizations: Not on file    Relationship status: Not on file  . Intimate partner violence:    Fear of current or ex partner: Not on file    Emotionally abused: Not on file    Physically abused: Not on file    Forced sexual activity: Not on file  Other Topics Concern  . Not on file  Social History Narrative   Lives with wife in a one story home.  Has a son and a daughter.     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     Education: 2 years of college.    Current Outpatient Medications on File Prior to Visit  Medication Sig Dispense Refill  . ACCU-CHEK FASTCLIX LANCETS MISC Used to check blood sugars up to 6x daily. 204 each 11  . alfuzosin (UROXATRAL) 10 MG 24 hr tablet TAKE 1 TABLET BY MOUTH  DAILY WITH BREAKFAST 90 tablet 2  . allopurinol (ZYLOPRIM) 300 MG tablet TAKE 1 TABLET BY MOUTH  DAILY 90 tablet 3  . amitriptyline (ELAVIL) 25 MG tablet Take 1 tablet (25 mg total) by mouth at bedtime. 90 tablet 1  . aspirin (BAYER LOW STRENGTH) 81 MG EC tablet Take 81 mg by mouth daily.      . Blood Glucose Monitoring Suppl (ACCU-CHEK AVIVA PLUS) w/Device KIT Use as directed 1 kit 0  . carvedilol (COREG) 3.125 MG tablet TAKE 1/2 TABLETS BY MOUTH 2 TIMES DAILY WITH MEALS. 90 tablet 4  . cycloSPORINE (RESTASIS) 0.05 % ophthalmic emulsion Place 1 drop into both eyes 2 (two) times daily.      . fluticasone (FLONASE) 50 MCG/ACT nasal spray Place 1 spray into the nose daily as needed for allergies.     . folic acid (FOLVITE) 1 MG tablet Take 2 mg by mouth 2 (two) times daily. 4 tabs daily    . furosemide (LASIX) 20 MG tablet TAKE 1 TABLET BY MOUTH  DAILY 90 tablet 3  . glucose blood (ACCU-CHEK AVIVA PLUS) test strip USE TO TEST BLOOD SUGAR 6  TIMES PER DAY; E11.9 600 each 11  . Insulin Infusion Pump Supplies (ACCU-CHEK PLASTIC CARTRIDGE) MISC USE 1 CARTRIDGE EVERY 2 DAYS 45 each 3  . Insulin Infusion Pump Supplies (ULTRAFLEX) MISC 1 Device by Other route every other day. 45 each 3  . insulin lispro (HUMALOG) 100 UNIT/ML injection INJECT SUBCUTANEOUSLY FOR  USE IN PUMP FOR A TOTAL OF  110 UNITS PER DAY 40 mL 4  . insulin lispro (HUMALOG) 100 UNIT/ML injection Inject 1.1 mLs (110 Units total) into the skin daily. 100 mL 0  . mometasone (ELOCON) 0.1 % lotion APPLY TOPICALLY DAILY 60 mL 11  . Multiple Vitamins-Minerals (CENTRUM SILVER PO) Take 1 tablet by mouth daily.      . Omega-3 Fatty Acids (FISH OIL) 1200 MG  CAPS Take 1,200 mg by mouth daily.     Marland Kitchen omeprazole (PRILOSEC) 20 MG capsule TAKE 1 CAPSULE BY MOUTH  DAILY AS NEEDED 90 capsule 3  .  rosuvastatin (CRESTOR) 40 MG tablet TAKE 1 TABLET BY MOUTH AT  BEDTIME 90 tablet 2  . vitamin B-12 (CYANOCOBALAMIN) 1000 MCG tablet Take 1,000 mcg by mouth daily.    . ciprofloxacin (CIPRO) 500 MG tablet Take 1 tablet (500 mg total) by mouth 2 (two) times daily. (Patient not taking: Reported on 12/25/2018) 6 tablet 0  . diclofenac sodium (VOLTAREN) 1 % GEL Apply 2 g topically 4 (four) times daily. (Patient not taking: Reported on 12/25/2018) 1 Tube 1   No current facility-administered medications on file prior to visit.     Allergies  Allergen Reactions  . Atorvastatin Other (See Comments)    unknown  . Niacin     REACTION: Severe heartburn  . Pioglitazone     REACTION: Edema    Family History  Problem Relation Age of Onset  . Colon cancer Brother 63  . Colon polyps Brother   . Cancer Brother        lung cancer, deceased  . Other Mother        MVA, deceased 56s  . Healthy Daughter   . Healthy Son   . Rectal cancer Neg Hx   . Stomach cancer Neg Hx     BP 132/64 (BP Location: Right Arm, Patient Position: Sitting, Cuff Size: Normal)   Pulse 83   Ht 6' (1.829 m)   Wt 280 lb 12.8 oz (127.4 kg)   SpO2 95%   BMI 38.08 kg/m  Review of Systems He denies hypoglycemia    Objective:   Physical Exam VITAL SIGNS:  See vs page GENERAL: no distress Pulses: dorsalis pedis intact bilat.   MSK: no deformity of the feet CV: 1+ bilat leg edema Skin:  no ulcer on the feet.  normal color and temp on the feet. Neuro: sensation is intact to touch on the feet   Lab Results  Component Value Date   HGBA1C 8.4 (A) 12/25/2018       Assessment & Plan:  Insulin-requiring type 2 DM, with PN: worse. Renal insuff: he needs more mealtime boluses than basal. Edema: this limits rx options.  Patient Instructions  I have sent a prescription to your pharmacy, for  the continuous glucose monitor Please take these pump settings:  basal rate of 2 units/hr, 24 hrs per day.   mealtime bolus of 1 unit/3 grams carbohydrate.  However, subtract 10 units from your calculated lunch bolus.  continue correction bolus (which some people call "sensitivity," or "insulin sensitivity ratio," or just "isr") of 1 unit for each 10 by which your glucose exceeds 100. Please come back for a follow-up appointment in 2 months.

## 2018-12-25 NOTE — Patient Instructions (Addendum)
I have sent a prescription to your pharmacy, for the continuous glucose monitor Please take these pump settings:  basal rate of 2 units/hr, 24 hrs per day.   mealtime bolus of 1 unit/3 grams carbohydrate.  However, subtract 10 units from your calculated lunch bolus.  continue correction bolus (which some people call "sensitivity," or "insulin sensitivity ratio," or just "isr") of 1 unit for each 10 by which your glucose exceeds 100. Please come back for a follow-up appointment in 2 months.

## 2019-01-06 ENCOUNTER — Telehealth: Payer: Self-pay | Admitting: Endocrinology

## 2019-01-06 DIAGNOSIS — E0800 Diabetes mellitus due to underlying condition with hyperosmolarity without nonketotic hyperglycemic-hyperosmolar coma (NKHHC): Secondary | ICD-10-CM

## 2019-01-06 NOTE — Telephone Encounter (Signed)
Called pt and informed of needs per insurance request. Scheduled for fasting labs 01/08/19

## 2019-01-06 NOTE — Telephone Encounter (Signed)
please contact patient: For pump, ins wants fasting labs.  I have ordered.

## 2019-01-08 ENCOUNTER — Other Ambulatory Visit (INDEPENDENT_AMBULATORY_CARE_PROVIDER_SITE_OTHER): Payer: Medicare Other

## 2019-01-08 ENCOUNTER — Other Ambulatory Visit: Payer: Self-pay

## 2019-01-08 DIAGNOSIS — E0800 Diabetes mellitus due to underlying condition with hyperosmolarity without nonketotic hyperglycemic-hyperosmolar coma (NKHHC): Secondary | ICD-10-CM

## 2019-01-08 LAB — BASIC METABOLIC PANEL
BUN: 22 mg/dL (ref 6–23)
CO2: 32 mEq/L (ref 19–32)
Calcium: 9.1 mg/dL (ref 8.4–10.5)
Chloride: 99 mEq/L (ref 96–112)
Creatinine, Ser: 1.71 mg/dL — ABNORMAL HIGH (ref 0.40–1.50)
GFR: 38.99 mL/min — ABNORMAL LOW (ref >60.00)
Glucose, Bld: 283 mg/dL — ABNORMAL HIGH (ref 70–99)
Potassium: 3.1 mEq/L — ABNORMAL LOW (ref 3.5–5.1)
Sodium: 139 mEq/L (ref 135–145)

## 2019-01-10 LAB — C-PEPTIDE: C-Peptide: 2.1 ng/mL (ref 0.80–3.85)

## 2019-01-14 ENCOUNTER — Other Ambulatory Visit: Payer: Self-pay | Admitting: Physical Medicine & Rehabilitation

## 2019-01-20 ENCOUNTER — Ambulatory Visit: Payer: Medicare Other | Admitting: Internal Medicine

## 2019-01-24 ENCOUNTER — Other Ambulatory Visit: Payer: Self-pay | Admitting: Endocrinology

## 2019-01-31 ENCOUNTER — Other Ambulatory Visit: Payer: Self-pay | Admitting: Physical Medicine & Rehabilitation

## 2019-01-31 ENCOUNTER — Other Ambulatory Visit: Payer: Self-pay | Admitting: Endocrinology

## 2019-01-31 NOTE — Telephone Encounter (Signed)
Please forward refill request to pt's new primary care provider.  

## 2019-02-03 ENCOUNTER — Other Ambulatory Visit: Payer: Self-pay

## 2019-02-03 ENCOUNTER — Encounter: Payer: Self-pay | Admitting: Family Medicine

## 2019-02-03 MED ORDER — OMEPRAZOLE 20 MG PO CPDR: 20.0000 mg | DELAYED_RELEASE_CAPSULE | Freq: Every day | ORAL | 2 refills | Status: DC | PRN

## 2019-02-03 MED ORDER — CARVEDILOL 3.125 MG PO TABS: ORAL_TABLET | ORAL | 2 refills | Status: DC

## 2019-02-06 ENCOUNTER — Ambulatory Visit: Payer: Medicare Other | Admitting: Endocrinology

## 2019-02-18 ENCOUNTER — Other Ambulatory Visit: Payer: Self-pay

## 2019-02-18 ENCOUNTER — Telehealth: Payer: Self-pay | Admitting: Nutrition

## 2019-02-18 NOTE — Telephone Encounter (Signed)
Patient called saying that he needs to come back in to have a C-Peptide done along with a FBS.  He was told that he must be on a day when his FBS is less than 225. He reported good understanding of this with an ok to call us on the morning he would like to come in for this.

## 2019-02-19 NOTE — Telephone Encounter (Signed)
Mr. Timberman was notified that we had a CPeptide with FBS done in 2015, and did not need another one.

## 2019-02-21 ENCOUNTER — Other Ambulatory Visit: Payer: Self-pay | Admitting: Endocrinology

## 2019-03-03 ENCOUNTER — Other Ambulatory Visit: Payer: Self-pay | Admitting: Endocrinology

## 2019-03-04 ENCOUNTER — Encounter: Payer: Self-pay | Admitting: Endocrinology

## 2019-03-05 ENCOUNTER — Ambulatory Visit (INDEPENDENT_AMBULATORY_CARE_PROVIDER_SITE_OTHER): Payer: Medicare Other | Admitting: Endocrinology

## 2019-03-05 ENCOUNTER — Other Ambulatory Visit: Payer: Self-pay

## 2019-03-05 DIAGNOSIS — E1129 Type 2 diabetes mellitus with other diabetic kidney complication: Secondary | ICD-10-CM | POA: Diagnosis not present

## 2019-03-05 DIAGNOSIS — E1159 Type 2 diabetes mellitus with other circulatory complications: Secondary | ICD-10-CM | POA: Diagnosis not present

## 2019-03-05 DIAGNOSIS — E0800 Diabetes mellitus due to underlying condition with hyperosmolarity without nonketotic hyperglycemic-hyperosmolar coma (NKHHC): Secondary | ICD-10-CM

## 2019-03-05 DIAGNOSIS — Z794 Long term (current) use of insulin: Secondary | ICD-10-CM

## 2019-03-05 NOTE — Progress Notes (Addendum)
Subjective:    Patient ID: [name], male    DOB: 04-23-1941, 78 y.o.   MRN: [id number]  HPI  telehealth visit today via doxy video visit.  Alternatives to telehealth are presented to this patient, and the patient agrees to the telehealth visit. Pt is advised of the cost of the visit, and agrees to this, also.   Patient is at home, and I am at the office.   Persons attending the telehealth visit: the patient and I Pt returns for f/u of diabetes mellitus: DM type: Insulin-requiring type 2 Dx'ed: 1749 Complications: polyneuropathy, CAD, and renal insufficiency.  Therapy: insulin since 1995.  DKA: never.  Severe hypoglycemia: never.  Pancreatitis: never.  Other: he started pump therapy (now accucheck, since mid-2015), and dexcom continuous glucose monitor.   Interval history: he takes these pump settings:   basal rate of 2.6 units/hr, 24 hrs per day.   mealtime bolus of 1 unit/3 grams carbohydrate.  However, subtract 10 units from your calculated lunch bolus.  continue correction bolus (which some people call "sensitivity," or "insulin sensitivity ratio," or just "isr") of 1 unit for each 10 by which your glucose exceeds 100.   TDD is approx 125 units.   I reviewed continuous glucose monitor.  Glucose varies from 100-210.  It is in general higher as the day goes on, but there is not much trend throughout the day.    Past Medical History:  Diagnosis Date  . Allergy   . Anal fissure   . Anemia, unspecified   . Arthritis    Spinal OA  . BACK PAIN, LUMBAR 10/23/2007  . CARDIAC MURMUR, SYSTOLIC 01/24/9674  . DIABETES MELLITUS, TYPE I 05/20/2007  . DIASTOLIC DYSFUNCTION 07/08/3845  . 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[place])   . Duodenitis   . Edema 05/12/2008  . Esophageal stricture   . GERD (gastroesophageal reflux disease)   . 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 Gynecomastia   . Hepatic steatosis   . HYPERLIPIDEMIA 05/20/2007  . Hypertension   . Hypogonadism male   . Morbid obesity  ([place]) 09/10/2009  . OBSTRUCTIVE SLEEP APNEA 11/04/2007  . Personal history of colonic polyps   . RENAL INSUFFICIENCY 05/12/2008  . Sleep apnea    uses cpap  . Urolithiasis     Past Surgical History:  Procedure Laterality Date  . Abdominal US  09/1997  . arm fracture Left 1958   with hardware  . Colon cancer screening    . COLONOSCOPY  08/18/2004   diverticulitis  . COLONOSCOPY  09/24/2009  . ELECTROCARDIOGRAM  02/2006  . FLEXIBLE SIGMOIDOSCOPY  03/04/2001   polyps, anal fissure  . GANGLION CYST EXCISION Left 1980  . Lower Arterial  04/13/2004  . POLYPECTOMY    . Rest Cardiolite  03/19/2003    Social History   Socioeconomic History  . Marital status: Married    Spouse name: Not on file  . Number of children: Not on file  . Years of education: Not on file  . Highest education level: Not on file  Occupational History    Employer: [employer]  . Financial resource strain: Not on file  . Food insecurity:    Worry: Not on file    Inability: Not on file  . Transportation needs:    Medical: Not on file    Non-medical: Not on file  Tobacco Use  . Smoking status: Former Smoker    Packs/day: 2.00    Years: 31.00  Pack years: 62.00    Types: Cigarettes    Last attempt to quit: 10/23/1982    Years since quitting: 36.3  . Smokeless tobacco: Never Used  Substance and Sexual Activity  . Alcohol use: No    Alcohol/week: 0.0 standard drinks  . Drug use: No  . Sexual activity: Not on file  Lifestyle  . Physical activity:    Days per week: Not on file    Minutes per session: Not on file  . Stress: Not on file  Relationships  . Social connections:    Talks on phone: Not on file    Gets together: Not on file    Attends religious service: Not on file    Active member of club or organization: Not on file    Attends meetings of clubs or organizations: Not on file    Relationship status: Not on file  . Intimate partner violence:    Fear of current or ex  partner: Not on file    Emotionally abused: Not on file    Physically abused: Not on file    Forced sexual activity: Not on file  Other Topics Concern  . Not on file  Social History Narrative   Lives with wife in a one story home.  Has a son and a daughter.     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     Education: 2 years of college.    Current Outpatient Medications on File Prior to Visit  Medication Sig Dispense Refill  . Accu-Chek FastClix Lancets MISC USE TO CHECK BLOOD SUGAR UP TO 6 TIMES DAILY 612 each 3  . alfuzosin (UROXATRAL) 10 MG 24 hr tablet TAKE 1 TABLET BY MOUTH  DAILY WITH BREAKFAST 90 tablet 2  . allopurinol (ZYLOPRIM) 300 MG tablet TAKE 1 TABLET BY MOUTH  DAILY 90 tablet 3  . amitriptyline (ELAVIL) 25 MG tablet TAKE 1 TABLET BY MOUTH AT  BEDTIME 90 tablet 1  . aspirin (BAYER LOW STRENGTH) 81 MG EC tablet Take 81 mg by mouth daily.      . Blood Glucose Monitoring Suppl (ACCU-CHEK AVIVA PLUS) w/Device KIT Use as directed 1 kit 0  . carvedilol (COREG) 3.125 MG tablet TAKE 1/2 TABLETS BY MOUTH 2 TIMES DAILY WITH MEALS. 90 tablet 2  . ciprofloxacin (CIPRO) 500 MG tablet Take 1 tablet (500 mg total) by mouth 2 (two) times daily. 6 tablet 0  . Continuous Blood Gluc Sensor (FREESTYLE LIBRE 14 DAY SENSOR) MISC 1 Device by Does not apply route every 14 (fourteen) days. 6 each 3  . cycloSPORINE (RESTASIS) 0.05 % ophthalmic emulsion Place 1 drop into both eyes 2 (two) times daily.      . diclofenac sodium (VOLTAREN) 1 % GEL Apply 2 g topically 4 (four) times daily. 1 Tube 1  . DULoxetine (CYMBALTA) 60 MG capsule TAKE 1 CAPSULE BY MOUTH  DAILY 30 capsule 3  . fluticasone (FLONASE) 50 MCG/ACT nasal spray Place 1 spray into the nose daily as needed for allergies.     . folic acid (FOLVITE) 1 MG tablet Take 2 mg by mouth 2 (two) times daily. 4 tabs daily    . furosemide (LASIX) 20 MG tablet TAKE 1 TABLET BY MOUTH  DAILY 90 tablet 3  . glucose blood (ACCU-CHEK AVIVA  PLUS) test strip USE TO TEST BLOOD SUGAR 6  TIMES PER DAY; E11.9 600 each 11  . Insulin Infusion Pump Supplies (ACCU-CHEK PLASTIC CARTRIDGE) MISC USE 1 CARTRIDGE EVERY  2 DAYS 45 each 3  . Insulin Infusion Pump Supplies (ULTRAFLEX) MISC 1 Device by Other route every other day. 45 each 3  . insulin lispro (HUMALOG) 100 UNIT/ML injection Inject 1.1 mLs (110 Units total) into the skin daily. 100 mL 0  . insulin lispro (HUMALOG) 100 UNIT/ML injection INJECT SUBCUTANEOUSLY FOR  USE IN PUMP FOR A TOTAL OF  110 UNITS PER DAY 100 mL 11  . mometasone (ELOCON) 0.1 % lotion APPLY TOPICALLY DAILY 60 mL 11  . Multiple Vitamins-Minerals (CENTRUM SILVER PO) Take 1 tablet by mouth daily.      . Omega-3 Fatty Acids (FISH OIL) 1200 MG CAPS Take 1,200 mg by mouth daily.     Marland Kitchen omeprazole (PRILOSEC) 20 MG capsule Take 1 capsule (20 mg total) by mouth daily as needed. 90 capsule 2  . rosuvastatin (CRESTOR) 40 MG tablet TAKE 1 TABLET BY MOUTH AT  BEDTIME 90 tablet 2  . vitamin B-12 (CYANOCOBALAMIN) 1000 MCG tablet Take 1,000 mcg by mouth daily.     No current facility-administered medications on file prior to visit.     Allergies  Allergen Reactions  . Atorvastatin Other (See Comments)    unknown  . Niacin     REACTION: Severe heartburn  . Pioglitazone     REACTION: Edema    Family History  Problem Relation Age of Onset  . Colon cancer Brother 31  . Colon polyps Brother   . Cancer Brother        lung cancer, deceased  . Other Mother        MVA, deceased 67s  . Healthy Daughter   . Healthy Son   . Rectal cancer Neg Hx   . Stomach cancer Neg Hx     There were no vitals taken for this visit.  Review of Systems He denies hypoglycemia    Objective:   Physical Exam       Assessment & Plan:  Insulin-requiring type 2 DM, with renal insuff: well-controlled CAD: in this setting, he should hypoglycemia, so i'm glad he got a continuous glucose monitor  Patient Instructions  Please take these pump  settings:  basal rate of 2.6 units/hr, 24 hrs per day.   mealtime bolus of 1 unit/3 grams carbohydrate.  However, subtract 10 units from your calculated lunch bolus.  continue correction bolus (which some people call "sensitivity," or "insulin sensitivity ratio," or just "isr") of 1 unit for each 10 by which your glucose exceeds 100.   Please come in to have the a1c checked.   Please come back for a follow-up appointment in 1-2 months.

## 2019-03-05 NOTE — Patient Instructions (Addendum)
Please take these pump settings:  basal rate of 2.6 units/hr, 24 hrs per day.   mealtime bolus of 1 unit/3 grams carbohydrate.  However, subtract 10 units from your calculated lunch bolus.  continue correction bolus (which some people call "sensitivity," or "insulin sensitivity ratio," or just "isr") of 1 unit for each 10 by which your glucose exceeds 100.   Please come in to have the a1c checked.   Please come back for a follow-up appointment in 1-2 months.

## 2019-03-06 ENCOUNTER — Other Ambulatory Visit: Payer: Self-pay

## 2019-03-06 ENCOUNTER — Ambulatory Visit (INDEPENDENT_AMBULATORY_CARE_PROVIDER_SITE_OTHER): Payer: Medicare Other

## 2019-03-06 DIAGNOSIS — E0800 Diabetes mellitus due to underlying condition with hyperosmolarity without nonketotic hyperglycemic-hyperosmolar coma (NKHHC): Secondary | ICD-10-CM

## 2019-03-06 LAB — POCT GLYCOSYLATED HEMOGLOBIN (HGB A1C): Hemoglobin A1C: 7.3 % — AB (ref 4.0–5.6)

## 2019-03-07 ENCOUNTER — Encounter: Payer: Self-pay | Admitting: Family Medicine

## 2019-03-07 DIAGNOSIS — N401 Enlarged prostate with lower urinary tract symptoms: Secondary | ICD-10-CM

## 2019-03-07 MED ORDER — ALFUZOSIN HCL ER 10 MG PO TB24: 10.0000 mg | ORAL_TABLET | Freq: Every day | ORAL | 2 refills | Status: DC

## 2019-03-10 ENCOUNTER — Encounter: Payer: Self-pay | Admitting: Endocrinology

## 2019-03-19 ENCOUNTER — Encounter: Payer: Medicare Other | Admitting: Physical Medicine & Rehabilitation

## 2019-03-26 ENCOUNTER — Encounter: Payer: Medicare Other | Attending: Endocrinology | Admitting: Nutrition

## 2019-03-26 ENCOUNTER — Other Ambulatory Visit: Payer: Self-pay

## 2019-03-26 DIAGNOSIS — E119 Type 2 diabetes mellitus without complications: Secondary | ICD-10-CM | POA: Insufficient documentation

## 2019-03-26 DIAGNOSIS — Z794 Long term (current) use of insulin: Secondary | ICD-10-CM | POA: Insufficient documentation

## 2019-03-27 ENCOUNTER — Telehealth: Payer: Self-pay | Admitting: Nutrition

## 2019-03-27 ENCOUNTER — Telehealth: Payer: Self-pay

## 2019-03-27 NOTE — Telephone Encounter (Signed)
From Leonia Reader, RN  Can you please order his Dexcom G6 sensors from [facility]. That is where he is getting his new pump supplies, that require the G6. He does not need the reader. Only the sensors--1Q 10 days. Thank you   Called pt and informed to call [facility] to request the refill. Explained the process to the pt. Verbalized acceptance and understanding. Advised we will process request once documents have been received and completed.

## 2019-03-27 NOTE — Telephone Encounter (Signed)
Patient reported that he has set up his T-Connect account without any problems.  He reports no difficulty giving boluses today at lunch or supper today.  He did report a low after lunch with IOB. His pump suspended the insulin, with an alarm, but it required him to treat this, and he treated it appropriately.  He was told that if this happens tomorrow, to call the office, download his pump and to let Dr. Loanne Drilling know.  He agreed to do this.

## 2019-03-27 NOTE — Patient Instructions (Signed)
Review all handouts given.  Call if questions Set up your T-Connect account at home.

## 2019-03-27 NOTE — Progress Notes (Signed)
Patient was trained on the Tandem Basal IQ insulin pump.  Settings were transferred from his old pump:  Basal rate: 2.6u /hr, I/C: 3, ISF: 10, target 4 hours.  He was shown how to give a bolus, and he redemonstrated this correctly X2, and how/when to use the temp basal rates.  He filled a cartridge with Humalog insulin and and attached a soft set 41mm infusion set to his right abdomen with little assistance from me.  His Dexcom was linked to his pump and this was started as well.   He was given handouts on:  How to change a cartridge, start/stop pump, use status screen, change settings, and fill a reservoir.  He was encourage to read them over and to call if questions.  He signed off the checklist as understanding all topics with no final questions.  I wlll call him this evening to see how he is doing.  He was told to call the office if blood sugars drop or go high during the day.  We reviewed high blood sugar protocol, and he reported good understanding of this. We downloaded his pump and set up his T-connect account while in the office.  He was told to finish this at home and to download the uploader to be able to download from home so Dr. Loanne Drilling can review if needed.  He agreed to do this, and had no final questions.

## 2019-04-02 MED ORDER — DEXCOM G6 SENSOR MISC: 1.0000 | 3 refills | Status: AC

## 2019-04-02 MED ORDER — T:SLIM INSULIN CARTRIDGE 3ML MISC: 3 refills | Status: AC

## 2019-04-02 MED ORDER — AUTOSOFT XC INFUSION SET MISC: 1.0000 | 3 refills | Status: DC

## 2019-04-02 NOTE — Addendum Note (Signed)
Addended by: Virl Cagey on: 04/02/2019 10:46 AM   Modules accepted: Orders

## 2019-04-02 NOTE — Telephone Encounter (Signed)
Spoke with [name], needing to send Rx for insulin pump supplies to [facility].  Rx sent for Dexcom sensors, Tslim pump supplies and tubing.   Nothing further needed.

## 2019-04-10 ENCOUNTER — Encounter: Payer: Medicare Other | Attending: Physical Medicine & Rehabilitation | Admitting: Physical Medicine & Rehabilitation

## 2019-04-17 ENCOUNTER — Ambulatory Visit: Payer: Medicare Other | Admitting: Family Medicine

## 2019-04-17 ENCOUNTER — Encounter: Payer: Self-pay | Admitting: Family Medicine

## 2019-04-17 VITALS — BP 120/70 | HR 86 | Ht 72.0 in

## 2019-04-17 DIAGNOSIS — W101XXA Fall (on)(from) sidewalk curb, initial encounter: Secondary | ICD-10-CM

## 2019-04-17 DIAGNOSIS — R296 Repeated falls: Secondary | ICD-10-CM

## 2019-04-17 DIAGNOSIS — E559 Vitamin D deficiency, unspecified: Secondary | ICD-10-CM

## 2019-04-17 NOTE — Progress Notes (Addendum)
Established Patient Office Visit  Subjective:  Patient ID: [name], male    DOB: December 10, 1940  Age: 78 y.o. MRN: [id number]  CC:  Chief Complaint  Patient presents with  . Fall    HPI [name] presents for evaluation of fall that happened 3 days ago.  He [place] lost his balance and fell forward.  He landed on his knees and outstretched hands.  Prior to this episode he denied lightheadedness, a spinning sensation, palpitations, shortness of breath or feeling faint.  His newest medicine is Elavil 25 mg at at bedtime for neuropathy.  He started this 6 months ago.  He has been taking Uroxatrol for BPH sent symptoms through urology for some time now.  He had been switched from tamsulosin for a reason he is unsure.  He has been taking carvedilol for some time as well.  Blood pressure averages as it is today.  Sugars have been running in the greater than 100 and less than 200 range.  He is actually had 3 falls in the last 4 months.  Symptoms prior to each fall were similar to those that happened 3 days ago.  He had cataract surgery back in February of this year.  Past Medical History:  Diagnosis Date  . Allergy   . Anal fissure   . Anemia, unspecified   . Arthritis    Spinal OA  . BACK PAIN, LUMBAR 10/23/2007  . CARDIAC MURMUR, SYSTOLIC 04/22/2457  . DIABETES MELLITUS, TYPE I 05/20/2007  . DIASTOLIC DYSFUNCTION 0/99/8338  . 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[place])   . Duodenitis   . Edema 05/12/2008  . Esophageal stricture   . GERD (gastroesophageal reflux disease)   . 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 Gynecomastia   . Hepatic steatosis   . HYPERLIPIDEMIA 05/20/2007  . Hypertension   . Hypogonadism male   . Morbid obesity ([place]) 09/10/2009  . OBSTRUCTIVE SLEEP APNEA 11/04/2007  . Personal history of colonic polyps   . RENAL INSUFFICIENCY 05/12/2008  . Sleep apnea    uses cpap  . Urolithiasis     Past Surgical History:  Procedure Laterality Date  .  Abdominal US  09/1997  . arm fracture Left 1958   with hardware  . Colon cancer screening    . COLONOSCOPY  08/18/2004   diverticulitis  . COLONOSCOPY  09/24/2009  . ELECTROCARDIOGRAM  02/2006  . FLEXIBLE SIGMOIDOSCOPY  03/04/2001   polyps, anal fissure  . GANGLION CYST EXCISION Left 1980  . Lower Arterial  04/13/2004  . POLYPECTOMY    . Rest Cardiolite  03/19/2003    Family History  Problem Relation Age of Onset  . Colon cancer Brother 24  . Colon polyps Brother   . Cancer Brother        lung cancer, deceased  . Other Mother        MVA, deceased 2s  . Healthy Daughter   . Healthy Son   . Rectal cancer Neg Hx   . Stomach cancer Neg Hx     Social History   Socioeconomic History  . Marital status: Married    Spouse name: Not on file  . Number of children: Not on file  . Years of education: Not on file  . Highest education level: Not on file  Occupational History    Employer: [employer]  . Financial resource strain: Not on file  . Food insecurity  Worry: Not on file    Inability: Not on file  . Transportation needs    Medical: Not on file    Non-medical: Not on file  Tobacco Use  . Smoking status: Former Smoker    Packs/day: 2.00    Years: 31.00    Pack years: 62.00    Types: Cigarettes    Quit date: 10/23/1982    Years since quitting: 36.6  . Smokeless tobacco: Never Used  Substance and Sexual Activity  . Alcohol use: No    Alcohol/week: 0.0 standard drinks  . Drug use: No  . Sexual activity: Not on file  Lifestyle  . Physical activity    Days per week: Not on file    Minutes per session: Not on file  . Stress: Not on file  Relationships  . Social Herbalist on phone: Not on file    Gets together: Not on file    Attends religious service: Not on file    Active member of club or organization: Not on file    Attends meetings of clubs or organizations: Not on file    Relationship status: Not on file  . Intimate partner  violence    Fear of current or ex partner: Not on file    Emotionally abused: Not on file    Physically abused: Not on file    Forced sexual activity: Not on file  Other Topics Concern  . Not on file  Social History Narrative   Lives with wife in a one story home.  Has a son and a daughter.     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     Education: 2 years of college.    Outpatient Medications Prior to Visit  Medication Sig Dispense Refill  . Accu-Chek FastClix Lancets MISC USE TO CHECK BLOOD SUGAR UP TO 6 TIMES DAILY 612 each 3  . alfuzosin (UROXATRAL) 10 MG 24 hr tablet Take 1 tablet (10 mg total) by mouth daily with breakfast. 90 tablet 2  . allopurinol (ZYLOPRIM) 300 MG tablet TAKE 1 TABLET BY MOUTH  DAILY 90 tablet 3  . aspirin (BAYER LOW STRENGTH) 81 MG EC tablet Take 81 mg by mouth daily.      . Blood Glucose Monitoring Suppl (ACCU-CHEK AVIVA PLUS) w/Device KIT Use as directed 1 kit 0  . carvedilol (COREG) 3.125 MG tablet TAKE 1/2 TABLETS BY MOUTH 2 TIMES DAILY WITH MEALS. 90 tablet 2  . Continuous Blood Gluc Sensor (DEXCOM G6 SENSOR) MISC Inject 1 Device into the skin as directed. Apply to skin SQ every 10 days 9 each 3  . cycloSPORINE (RESTASIS) 0.05 % ophthalmic emulsion Place 1 drop into both eyes 2 (two) times daily.      . diclofenac sodium (VOLTAREN) 1 % GEL Apply 2 g topically 4 (four) times daily. 1 Tube 1  . DULoxetine (CYMBALTA) 60 MG capsule TAKE 1 CAPSULE BY MOUTH  DAILY 30 capsule 3  . fluticasone (FLONASE) 50 MCG/ACT nasal spray Place 1 spray into the nose daily as needed for allergies.     . folic acid (FOLVITE) 1 MG tablet Take 2 mg by mouth 2 (two) times daily. 4 tabs daily    . glucose blood (ACCU-CHEK AVIVA PLUS) test strip USE TO TEST BLOOD SUGAR 6  TIMES PER DAY; E11.9 600 each 11  . Insulin Infusion Pump Supplies (AUTOSOFT XC INFUSION SET) MISC Inject 1 Act into the skin every other day. Use with  57m cannula and 23 inch tubing. *5 boxes (90  day supply) 5 each 3  . Insulin Infusion Pump Supplies (T:SLIM INSULIN CARTRIDGE 3ML) MISC Use with insulin pump, fill once every 2 days. 5 each 3  . insulin lispro (HUMALOG) 100 UNIT/ML injection Inject 1.1 mLs (110 Units total) into the skin daily. (Patient taking differently: Inject 130 Units into the skin daily. ) 100 mL 0  . Multiple Vitamins-Minerals (CENTRUM SILVER PO) Take 1 tablet by mouth daily.      . Omega-3 Fatty Acids (FISH OIL) 1200 MG CAPS Take 1,200 mg by mouth daily.     .Marland Kitchenomeprazole (PRILOSEC) 20 MG capsule Take 1 capsule (20 mg total) by mouth daily as needed. 90 capsule 2  . vitamin B-12 (CYANOCOBALAMIN) 1000 MCG tablet Take 1,000 mcg by mouth daily.    .Marland Kitchenamitriptyline (ELAVIL) 25 MG tablet TAKE 1 TABLET BY MOUTH AT  BEDTIME 90 tablet 1  . Continuous Blood Gluc Sensor (FREESTYLE LIBRE 14 DAY SENSOR) MISC 1 Device by Does not apply route every 14 (fourteen) days. 6 each 3  . furosemide (LASIX) 20 MG tablet TAKE 1 TABLET BY MOUTH  DAILY 90 tablet 3  . Insulin Infusion Pump Supplies (ACCU-CHEK PLASTIC CARTRIDGE) MISC USE 1 CARTRIDGE EVERY 2 DAYS 45 each 3  . Insulin Infusion Pump Supplies (ULTRAFLEX) MISC 1 Device by Other route every other day. 45 each 3  . insulin lispro (HUMALOG) 100 UNIT/ML injection INJECT SUBCUTANEOUSLY FOR  USE IN PUMP FOR A TOTAL OF  110 UNITS PER DAY 100 mL 11  . mometasone (ELOCON) 0.1 % lotion APPLY TOPICALLY DAILY 60 mL 11  . rosuvastatin (CRESTOR) 40 MG tablet TAKE 1 TABLET BY MOUTH AT  BEDTIME 90 tablet 2  . ciprofloxacin (CIPRO) 500 MG tablet Take 1 tablet (500 mg total) by mouth 2 (two) times daily. 6 tablet 0   No facility-administered medications prior to visit.     Allergies  Allergen Reactions  . Atorvastatin Other (See Comments)    unknown  . Niacin     REACTION: Severe heartburn  . Pioglitazone     REACTION: Edema    ROS Review of Systems  Constitutional: Negative for diaphoresis, fatigue, fever and unexpected weight change.   HENT: Negative.   Eyes: Negative for photophobia, pain, discharge, redness, itching and visual disturbance.  Respiratory: Negative.   Cardiovascular: Negative for chest pain and palpitations.  Gastrointestinal: Negative.   Genitourinary: Negative.   Neurological: Negative for dizziness, seizures, speech difficulty, weakness, light-headedness and headaches.  Hematological: Does not bruise/bleed easily.  Psychiatric/Behavioral: Negative.       Objective:    Physical Exam  Constitutional: He is oriented to person, place, and time. He appears well-developed and well-nourished. No distress.  HENT:  Head: Normocephalic and atraumatic.  Right Ear: External ear normal.  Left Ear: External ear normal.  Mouth/Throat: Oropharynx is clear and moist. No oropharyngeal exudate.  Eyes: Pupils are equal, round, and reactive to light. Conjunctivae and EOM are normal. Right eye exhibits no discharge. Left eye exhibits no discharge. No scleral icterus.    Neck: No JVD present. No tracheal deviation present. No thyromegaly present.  Cardiovascular: Normal rate, regular rhythm and normal heart sounds.  Pulmonary/Chest: Effort normal and breath sounds normal. No stridor.  Abdominal: Bowel sounds are normal.  Musculoskeletal:        General: No edema.  Lymphadenopathy:    He has no cervical adenopathy.  Neurological: He is alert and oriented to person,  place, and time. He has normal strength. He displays a negative Romberg sign.  Skin: Skin is warm and dry. He is not diaphoretic.  Psychiatric: He has a normal mood and affect. His behavior is normal.    BP 120/70   Pulse 86   Ht 6' (1.829 m)   SpO2 97%   BMI 38.08 kg/m  Wt Readings from Last 3 Encounters:  05/13/19 274 lb 12.8 oz (124.6 kg)  05/08/19 275 lb 3.2 oz (124.8 kg)  05/01/19 274 lb 6 oz (124.5 kg)   BP Readings from Last 3 Encounters:  05/13/19 134/76  05/08/19 116/62  05/01/19 138/70   Guideline developer:  UpToDate (see  UpToDate for funding source) Date Released: June 2014  Health Maintenance Due  Topic Date Due  . URINE MICROALBUMIN  05/10/2019  . INFLUENZA VACCINE  05/24/2019    There are no preventive care reminders to display for this patient.  Lab Results  Component Value Date   TSH 5.91 (H) 05/09/2018   Lab Results  Component Value Date   WBC 5.0 04/17/2019   HGB 15.2 04/17/2019   HCT 44.5 04/17/2019   MCV 90.8 04/17/2019   PLT 137 (L) 04/17/2019   Lab Results  Component Value Date   NA 139 01/08/2019   K 3.1 (L) 01/08/2019   CO2 32 01/08/2019   GLUCOSE 283 (H) 01/08/2019   BUN 22 01/08/2019   CREATININE 1.71 (H) 01/08/2019   BILITOT 0.6 05/09/2018   ALKPHOS 82 05/09/2018   AST 17 05/09/2018   ALT 23 05/09/2018   PROT 6.5 05/09/2018   ALBUMIN 3.9 05/09/2018   CALCIUM 9.1 01/08/2019   ANIONGAP 8 11/28/2014   GFR 38.99 (L) 01/08/2019   Lab Results  Component Value Date   CHOL 127 05/09/2018   Lab Results  Component Value Date   HDL 34.30 (L) 05/09/2018   Lab Results  Component Value Date   LDLCALC 59 05/09/2018   Lab Results  Component Value Date   TRIG 170.0 (H) 05/09/2018   Lab Results  Component Value Date   CHOLHDL 4 05/09/2018   Lab Results  Component Value Date   HGBA1C 6.7 (A) 05/13/2019      Assessment & Plan:   Problem List Items Addressed This Visit      Other   Fall (on)(from) sidewalk curb, initial encounter - Primary   Relevant Orders   CBC (Completed)   Urinalysis, Routine w reflex microscopic (Completed)   VITAMIN D 25 Hydroxy (Vit-D Deficiency, Fractures) (Completed)   MICROSCOPIC MESSAGE (Completed)   Vitamin D deficiency   Relevant Medications   Vitamin D, Ergocalciferol, (DRISDOL) 1.25 MG (50000 UT) CAPS capsule   Frequent falls      Meds ordered this encounter  Medications  . Vitamin D, Ergocalciferol, (DRISDOL) 1.25 MG (50000 UT) CAPS capsule    Sig: Take 1 capsule (50,000 Units total) by mouth every 7 (seven) days.     Dispense:  5 capsule    Refill:  6    Follow-up: Return if symptoms worsen or fail to improve.   Patient has follow-up with urology in a few weeks.  Suggested that he restart tamsulosin in favor of the nonselective alpha-blocker he is currently taking.  He will continue the use of his cane.  Checking CBC, UA and vitamin D levels.  Orthostatics today are normal.  Will consider physical therapy if thoughts continued.  Information was given on falls and their prevention.

## 2019-04-17 NOTE — Patient Instructions (Signed)
Understanding Your Risk for Falls Each year, millions of people suffer serious injuries from falls. It is important to understand your risk for falling. Talk with your health care provider about your risk and what you can do to lower it. There are actions you can take at home to lower your risk. If you do have a serious fall, it is important to tell your health care provider. Falling once raises your risk for falling again. How can falls affect me? Serious injuries from falls are common. These include:  Broken bones. Most hip fractures are caused by falls.  Traumatic brain injury (TBI). Falls are the most common cause of TBI. Fear of falling can also cause you to avoid activities and stay at home. This can make your muscles weaker and actually raise your risk for a fall. What can increase my risk? Serious injuries from a fall most often happen to people older than age 65. Children and young adults ages 15-29 are also at higher risk. The more risk factors you have for falling, the higher your risk. Risk factors include:  Weakness in the lower body.  Lack (deficiency) of vitamin D.  Weak bones (osteoporosis).  Being generally weak or confused due to long-term (chronic) illness.  Dizziness or balance problems.  Poor vision.  Having depression.  Medicine that causes dizziness or drowsiness. These can include medicines for your blood pressure, heart, anxiety, insomnia, or edema, as well as pain medicines and muscle relaxants.  Drinking alcohol.  Foot pain or improper footwear.  Working at a dangerous job.  Having had a fall in the past.  Tripping hazards at home, such as floor clutter or loose rugs, or poor lighting.  Having pets or clutter in your home. What actions can I take to lower my risk of falling?      Maintain physical fitness: ? Do strength and balance exercises. Consider taking a regular class to build strength and balance. Yoga and tai chi are good options. ?  Have your eyes checked every year and your vision prescription updated as needed.  Remove all clutter from walkways and stairways, including extension cords.  Use a cordless phone.  Do not use throw rugs. Make sure all carpeting is taped or tacked down securely.  Use good lighting in all rooms. Keep a flashlight near your bed.  Make sure there is a clear path from your bed to the bathroom. Use night-lights.  Install grab bars for your tub, shower, and toilet. Use a bath mat in your tub or shower.  Attach secure railings on both sides of your stairs.  Repair uneven or broken steps.  Use a cane or walker as directed by your health care provider.  Wear nonskid shoes. Do not wear high heels. Do not walk around the house in socks or slippers.  Avoid walking on icy or slippery surfaces. Walk on the grass instead of on icy or slick sidewalks. Where you can, use ice melt to get rid of ice on walkways. Questions to ask your health care provider  Can you help me evaluate my risk for a fall?  Do any of my medicines make me more likely to fall?  Should I take a vitamin D supplement?  What exercises can I do to improve my strength and balance?  Should I make an appointment to have my vision checked?  Do I need a bone density test to check for osteoporosis?  Would it help to use a cane or a walker? Where   What exercises can I do to improve my strength and balance?   Should I make an appointment to have my vision checked?   Do I need a bone density test to check for osteoporosis?   Would it help to use a cane or a walker?  Where to find more information   Centers for Disease Control and Prevention, STEADI: cdc.gov   Community-Based Fall Prevention Programs: cdc.gov   National Institute on Aging: go4life.nia.nih.gov  Contact a health care provider if:   You fall at home.   You are afraid of falling at home.   You feel weak, drowsy, or dizzy at home.  Summary   People 65 and older are at high risk for falling. However, older people are not the only ones injured in falls. Children and young adults have a higher-than-normal risk, too.   Talk with your health care provider about your risks for falling  and how to lower those risks.   Taking certain precautions at home can lower your risk for falling.   If you fall, always tell your health care provider.  This information is not intended to replace advice given to you by your health care provider. Make sure you discuss any questions you have with your health care provider.  Document Released: 05/23/2017 Document Revised: 05/23/2017 Document Reviewed: 05/23/2017  Elsevier Interactive Patient Education  2019 Elsevier Inc.

## 2019-04-18 DIAGNOSIS — E559 Vitamin D deficiency, unspecified: Secondary | ICD-10-CM | POA: Insufficient documentation

## 2019-04-18 DIAGNOSIS — W101XXA Fall (on)(from) sidewalk curb, initial encounter: Secondary | ICD-10-CM | POA: Insufficient documentation

## 2019-04-18 LAB — URINALYSIS, ROUTINE W REFLEX MICROSCOPIC
Bacteria, UA: NONE SEEN /HPF
Bilirubin Urine: NEGATIVE
Glucose, UA: NEGATIVE
Hgb urine dipstick: NEGATIVE
Hyaline Cast: NONE SEEN /LPF
Ketones, ur: NEGATIVE
Nitrite: NEGATIVE
Protein, ur: NEGATIVE
RBC / HPF: NONE SEEN /HPF (ref 0–2)
Specific Gravity, Urine: 1.018 (ref 1.001–1.03)
pH: 5.5 (ref 5.0–8.0)

## 2019-04-18 LAB — CBC
HCT: 44.5 % (ref 38.5–50.0)
Hemoglobin: 15.2 g/dL (ref 13.2–17.1)
MCH: 31 pg (ref 27.0–33.0)
MCHC: 34.2 g/dL (ref 32.0–36.0)
MCV: 90.8 fL (ref 80.0–100.0)
MPV: 12.8 fL — ABNORMAL HIGH (ref 7.5–12.5)
Platelets: 137 10*3/uL — ABNORMAL LOW (ref 140–400)
RBC: 4.9 10*6/uL (ref 4.20–5.80)
RDW: 14.1 % (ref 11.0–15.0)
WBC: 5 10*3/uL (ref 3.8–10.8)

## 2019-04-18 LAB — VITAMIN D 25 HYDROXY (VIT D DEFICIENCY, FRACTURES): Vit D, 25-Hydroxy: 25 ng/mL — ABNORMAL LOW (ref 30–100)

## 2019-04-18 MED ORDER — VITAMIN D (ERGOCALCIFEROL) 1.25 MG (50000 UNIT) PO CAPS: 50000.0000 [IU] | ORAL_CAPSULE | ORAL | 6 refills | Status: DC

## 2019-04-18 NOTE — Addendum Note (Signed)
Addended by: Jon Billings on: 04/18/2019 09:17 AM   Modules accepted: Orders

## 2019-04-19 ENCOUNTER — Other Ambulatory Visit: Payer: Self-pay | Admitting: Endocrinology

## 2019-04-20 NOTE — Telephone Encounter (Signed)
Please forward refill request to pt's new primary care provider.  

## 2019-04-24 ENCOUNTER — Ambulatory Visit: Payer: Medicare Other | Admitting: Internal Medicine

## 2019-04-28 ENCOUNTER — Other Ambulatory Visit: Payer: Self-pay | Admitting: Endocrinology

## 2019-04-28 ENCOUNTER — Other Ambulatory Visit: Payer: Self-pay | Admitting: Physical Medicine & Rehabilitation

## 2019-04-30 ENCOUNTER — Ambulatory Visit: Payer: Medicare Other | Admitting: Family Medicine

## 2019-04-30 ENCOUNTER — Telehealth: Payer: Self-pay

## 2019-04-30 NOTE — Telephone Encounter (Signed)

## 2019-05-01 ENCOUNTER — Other Ambulatory Visit: Payer: Self-pay

## 2019-05-01 ENCOUNTER — Encounter: Payer: Self-pay | Admitting: Physical Medicine & Rehabilitation

## 2019-05-01 ENCOUNTER — Ambulatory Visit: Payer: Medicare Other | Admitting: Family Medicine

## 2019-05-01 ENCOUNTER — Encounter: Payer: Self-pay | Admitting: Family Medicine

## 2019-05-01 ENCOUNTER — Encounter: Payer: Medicare Other | Attending: Physical Medicine & Rehabilitation | Admitting: Physical Medicine & Rehabilitation

## 2019-05-01 VITALS — BP 138/70 | HR 88 | Ht 72.0 in | Wt 274.4 lb

## 2019-05-01 VITALS — BP 144/80 | HR 71 | Temp 97.3°F | Ht 72.0 in | Wt 278.0 lb

## 2019-05-01 DIAGNOSIS — M791 Myalgia, unspecified site: Secondary | ICD-10-CM

## 2019-05-01 DIAGNOSIS — M545 Low back pain: Secondary | ICD-10-CM | POA: Insufficient documentation

## 2019-05-01 DIAGNOSIS — W010XXA Fall on same level from slipping, tripping and stumbling without subsequent striking against object, initial encounter: Secondary | ICD-10-CM

## 2019-05-01 DIAGNOSIS — E1142 Type 2 diabetes mellitus with diabetic polyneuropathy: Secondary | ICD-10-CM

## 2019-05-01 DIAGNOSIS — R269 Unspecified abnormalities of gait and mobility: Secondary | ICD-10-CM | POA: Insufficient documentation

## 2019-05-01 DIAGNOSIS — W101XXA Fall (on)(from) sidewalk curb, initial encounter: Secondary | ICD-10-CM | POA: Diagnosis not present

## 2019-05-01 DIAGNOSIS — G479 Sleep disorder, unspecified: Secondary | ICD-10-CM

## 2019-05-01 DIAGNOSIS — M461 Sacroiliitis, not elsewhere classified: Secondary | ICD-10-CM | POA: Diagnosis present

## 2019-05-01 DIAGNOSIS — G8929 Other chronic pain: Secondary | ICD-10-CM | POA: Insufficient documentation

## 2019-05-01 NOTE — Progress Notes (Addendum)
Established Patient Office Visit  Subjective:  Patient ID: [name], male    DOB: 03-Apr-1941  Age: 78 y.o. MRN: [id number]  CC:  Chief Complaint  Patient presents with  . Follow-up    HPI [name] presents for follow-up of his fall.  Feeling well and denies any further falls.  Denies palpitations lightheadedness or any feeling of a spinning sensation.  He has started his vitamin D.  He has a history of thrombocytopenia.  Review of the medical record shows that it is stable.  Status post recent follow-up with physical medicine and they are sending him for physical therapy to strengthen his lower extremities.  Recent episode of hematuria.  Seeing the urologist.  Diagnosed with cystitis and treated with Cipro.  No history of recent instrumentation or prostate cancer.  History of renal lithiasis.  Past Medical History:  Diagnosis Date  . Allergy   . Anal fissure   . Anemia, unspecified   . Arthritis    Spinal OA  . BACK PAIN, LUMBAR 10/23/2007  . CARDIAC MURMUR, SYSTOLIC 04/29/5884  . DIABETES MELLITUS, TYPE I 05/20/2007  . DIASTOLIC DYSFUNCTION 0/27/7412  . 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 Duodenitis   . Edema 05/12/2008  . Esophageal stricture   . GERD (gastroesophageal reflux disease)   . 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 Gynecomastia   . Hepatic steatosis   . HYPERLIPIDEMIA 05/20/2007  . Hypertension   . Hypogonadism male   . Morbid obesity ([place]) 09/10/2009  . OBSTRUCTIVE SLEEP APNEA 11/04/2007  . Personal history of colonic polyps   . RENAL INSUFFICIENCY 05/12/2008  . Sleep apnea    uses cpap  . Urolithiasis     Past Surgical History:  Procedure Laterality Date  . Abdominal US  09/1997  . arm fracture Left 1958   with hardware  . Colon cancer screening    . COLONOSCOPY  08/18/2004   diverticulitis  . COLONOSCOPY  09/24/2009  . ELECTROCARDIOGRAM  02/2006  . FLEXIBLE SIGMOIDOSCOPY  03/04/2001   polyps, anal fissure  . GANGLION CYST EXCISION Left 1980  .  Lower Arterial  04/13/2004  . POLYPECTOMY    . Rest Cardiolite  03/19/2003    Family History  Problem Relation Age of Onset  . Colon cancer Brother 44  . Colon polyps Brother   . Cancer Brother        lung cancer, deceased  . Other Mother        MVA, deceased 57s  . Healthy Daughter   . Healthy Son   . Rectal cancer Neg Hx   . Stomach cancer Neg Hx     Social History   Socioeconomic History  . Marital status: Married    Spouse name: Not on file  . Number of children: Not on file  . Years of education: Not on file  . Highest education level: Not on file  Occupational History    Employer: [employer]  . Financial resource strain: Not on file  . Food insecurity    Worry: Not on file    Inability: Not on file  . Transportation needs    Medical: Not on file    Non-medical: Not on file  Tobacco Use  . Smoking status: Former Smoker    Packs/day: 2.00    Years: 31.00    Pack years: 62.00    Types: Cigarettes    Quit date: 10/23/1982    Years since quitting: 36.6  .  Smokeless tobacco: Never Used  Substance and Sexual Activity  . Alcohol use: No    Alcohol/week: 0.0 standard drinks  . Drug use: No  . Sexual activity: Not on file  Lifestyle  . Physical activity    Days per week: Not on file    Minutes per session: Not on file  . Stress: Not on file  Relationships  . Social Herbalist on phone: Not on file    Gets together: Not on file    Attends religious service: Not on file    Active member of club or organization: Not on file    Attends meetings of clubs or organizations: Not on file    Relationship status: Not on file  . Intimate partner violence    Fear of current or ex partner: Not on file    Emotionally abused: Not on file    Physically abused: Not on file    Forced sexual activity: Not on file  Other Topics Concern  . Not on file  Social History Narrative   Lives with wife in a one story home.  Has a son and a daughter.      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     Education: 2 years of college.    Outpatient Medications Prior to Visit  Medication Sig Dispense Refill  . Accu-Chek FastClix Lancets MISC USE TO CHECK BLOOD SUGAR UP TO 6 TIMES DAILY 612 each 3  . alfuzosin (UROXATRAL) 10 MG 24 hr tablet Take 1 tablet (10 mg total) by mouth daily with breakfast. 90 tablet 2  . allopurinol (ZYLOPRIM) 300 MG tablet TAKE 1 TABLET BY MOUTH  DAILY 90 tablet 3  . aspirin (BAYER LOW STRENGTH) 81 MG EC tablet Take 81 mg by mouth daily.      . Blood Glucose Monitoring Suppl (ACCU-CHEK AVIVA PLUS) w/Device KIT Use as directed 1 kit 0  . carvedilol (COREG) 3.125 MG tablet TAKE 1/2 TABLETS BY MOUTH 2 TIMES DAILY WITH MEALS. 90 tablet 2  . Continuous Blood Gluc Sensor (DEXCOM G6 SENSOR) MISC Inject 1 Device into the skin as directed. Apply to skin SQ every 10 days 9 each 3  . cycloSPORINE (RESTASIS) 0.05 % ophthalmic emulsion Place 1 drop into both eyes 2 (two) times daily.      . diclofenac sodium (VOLTAREN) 1 % GEL Apply 2 g topically 4 (four) times daily. 1 Tube 1  . DULoxetine (CYMBALTA) 60 MG capsule TAKE 1 CAPSULE BY MOUTH  DAILY 30 capsule 3  . fluticasone (FLONASE) 50 MCG/ACT nasal spray Place 1 spray into the nose daily as needed for allergies.     . folic acid (FOLVITE) 1 MG tablet Take 2 mg by mouth 2 (two) times daily. 4 tabs daily    . glucose blood (ACCU-CHEK AVIVA PLUS) test strip USE TO TEST BLOOD SUGAR 6  TIMES PER DAY; E11.9 600 each 11  . Insulin Infusion Pump Supplies (AUTOSOFT XC INFUSION SET) MISC Inject 1 Act into the skin every other day. Use with 11m cannula and 23 inch tubing. *5 boxes (90 day supply) 5 each 3  . Insulin Infusion Pump Supplies (T:SLIM INSULIN CARTRIDGE 3ML) MISC Use with insulin pump, fill once every 2 days. 5 each 3  . insulin lispro (HUMALOG) 100 UNIT/ML injection Inject 1.1 mLs (110 Units total) into the skin daily. (Patient taking differently: Inject 130 Units into  the skin daily. ) 100 mL 0  . Multiple Vitamins-Minerals (  CENTRUM SILVER PO) Take 1 tablet by mouth daily.      . Omega-3 Fatty Acids (FISH OIL) 1200 MG CAPS Take 1,200 mg by mouth daily.     Marland Kitchen omeprazole (PRILOSEC) 20 MG capsule Take 1 capsule (20 mg total) by mouth daily as needed. 90 capsule 2  . vitamin B-12 (CYANOCOBALAMIN) 1000 MCG tablet Take 1,000 mcg by mouth daily.    . Vitamin D, Ergocalciferol, (DRISDOL) 1.25 MG (50000 UT) CAPS capsule Take 1 capsule (50,000 Units total) by mouth every 7 (seven) days. 5 capsule 6  . amitriptyline (ELAVIL) 25 MG tablet TAKE 1 TABLET BY MOUTH AT  BEDTIME 90 tablet 1  . Continuous Blood Gluc Sensor (FREESTYLE LIBRE 14 DAY SENSOR) MISC 1 Device by Does not apply route every 14 (fourteen) days. 6 each 3  . furosemide (LASIX) 20 MG tablet TAKE 1 TABLET BY MOUTH  DAILY 90 tablet 3  . Insulin Infusion Pump Supplies (ACCU-CHEK PLASTIC CARTRIDGE) MISC USE 1 CARTRIDGE EVERY 2 DAYS 45 each 3  . Insulin Infusion Pump Supplies (ULTRAFLEX) MISC 1 Device by Other route every other day. 45 each 3  . insulin lispro (HUMALOG) 100 UNIT/ML injection INJECT SUBCUTANEOUSLY FOR  USE IN PUMP FOR A TOTAL OF  110 UNITS PER DAY 100 mL 11  . mometasone (ELOCON) 0.1 % lotion APPLY TOPICALLY DAILY 60 mL 11  . rosuvastatin (CRESTOR) 40 MG tablet TAKE 1 TABLET BY MOUTH AT  BEDTIME 90 tablet 2   No facility-administered medications prior to visit.     Allergies  Allergen Reactions  . Atorvastatin Other (See Comments)    unknown  . Niacin     REACTION: Severe heartburn  . Pioglitazone     REACTION: Edema    ROS Review of Systems  Constitutional: Negative.   Eyes: Negative for photophobia and visual disturbance.  Respiratory: Negative.  Negative for chest tightness and shortness of breath.   Cardiovascular: Negative.  Negative for chest pain and palpitations.  Gastrointestinal: Negative.   Musculoskeletal: Negative for gait problem.  Neurological: Negative for dizziness,  weakness and light-headedness.  Psychiatric/Behavioral: Negative.       Objective:    Physical Exam  Constitutional: He is oriented to person, place, and time. He appears well-developed and well-nourished. No distress.  HENT:  Head: Normocephalic and atraumatic.  Right Ear: External ear normal.  Left Ear: External ear normal.  Eyes: Conjunctivae are normal. Right eye exhibits no discharge. Left eye exhibits no discharge. No scleral icterus.  Cardiovascular: Normal rate, regular rhythm and normal heart sounds.  Pulmonary/Chest: Effort normal and breath sounds normal.  Musculoskeletal:        General: No edema.  Neurological: He is alert and oriented to person, place, and time.  Skin: Skin is warm and dry. He is not diaphoretic.  Psychiatric: He has a normal mood and affect. His behavior is normal.    BP 138/70   Pulse 88   Ht 6' (1.829 m)   Wt 274 lb 6 oz (124.5 kg)   SpO2 97%   BMI 37.21 kg/m  Wt Readings from Last 3 Encounters:  05/13/19 274 lb 12.8 oz (124.6 kg)  05/08/19 275 lb 3.2 oz (124.8 kg)  05/01/19 274 lb 6 oz (124.5 kg)   BP Readings from Last 3 Encounters:  05/13/19 134/76  05/08/19 116/62  05/01/19 138/70   Guideline developer:  UpToDate (see UpToDate for funding source) Date Released: June 2014  Health Maintenance Due  Topic Date Due  .  URINE MICROALBUMIN  05/10/2019  . INFLUENZA VACCINE  05/24/2019    There are no preventive care reminders to display for this patient.  Lab Results  Component Value Date   TSH 5.91 (H) 05/09/2018   Lab Results  Component Value Date   WBC 5.0 04/17/2019   HGB 15.2 04/17/2019   HCT 44.5 04/17/2019   MCV 90.8 04/17/2019   PLT 137 (L) 04/17/2019   Lab Results  Component Value Date   NA 139 01/08/2019   K 3.1 (L) 01/08/2019   CO2 32 01/08/2019   GLUCOSE 283 (H) 01/08/2019   BUN 22 01/08/2019   CREATININE 1.71 (H) 01/08/2019   BILITOT 0.6 05/09/2018   ALKPHOS 82 05/09/2018   AST 17 05/09/2018   ALT 23  05/09/2018   PROT 6.5 05/09/2018   ALBUMIN 3.9 05/09/2018   CALCIUM 9.1 01/08/2019   ANIONGAP 8 11/28/2014   GFR 38.99 (L) 01/08/2019   Lab Results  Component Value Date   CHOL 127 05/09/2018   Lab Results  Component Value Date   HDL 34.30 (L) 05/09/2018   Lab Results  Component Value Date   LDLCALC 59 05/09/2018   Lab Results  Component Value Date   TRIG 170.0 (H) 05/09/2018   Lab Results  Component Value Date   CHOLHDL 4 05/09/2018   Lab Results  Component Value Date   HGBA1C 6.7 (A) 05/13/2019      Assessment & Plan:   Problem List Items Addressed This Visit      Other   Fall (on)(from) sidewalk curb, initial encounter - Primary   Fall on same level from slipping, tripping, or stumbling   Gait disturbance      No orders of the defined types were placed in this encounter.   Follow-up: Return in about 2 months (around 07/02/2019), or return in September for physical exam..

## 2019-05-01 NOTE — Progress Notes (Signed)
Subjective:    Patient ID: [name], male    DOB: 25-Sep-1941, 78 y.o.   MRN: [id number]  HPI  Male with pmh of OSA, CKD, HTN, DM type 1 with peripheral neuropathy presents for follow up of low back pain > neck pain.   On first visit, pt complained of b/l L>R back pain.  Started ~>20 years ago.  No inciting event.  Sitting/laying improve the pain.  Standing exacerbates the pain.  Sharp, burning pain.  Radiates to left posterior thigh.  Intermittent.  He has associated numbness/tingling.  He only tried ASA, which helps some.  Pain limits from doing ADLs and fishing.  He denies falls.  Last clinic visit on 09/18/18.  Since that time, patient states he has had several falls getting off step/curb.  He started using a cane and is having trouble getting up from seated position. Being worked up by PCP. Using baclofen ~1/week. Taking Cymbalta and Elavil.  Using aspacream with lidoderm ~2/week.   Pain Inventory Average Pain 6 Pain Right Now 3 My pain is sharp, burning and tingling  In the last 24 hours, has pain interfered with the following? General activity 6 Relation with others 0 Enjoyment of life 4 What TIME of day is your pain at its worst? daytime Sleep (in general) Good  Pain is worse with: walking, standing and some activites Pain improves with: rest, heat/ice and TENS Relief from Meds: na  Mobility walk with assistance use a cane ability to climb steps?  yes do you drive?  yes  Function retired  Neuro/Psych weakness numbness tingling  Prior Studies Any changes since last visit?  no  Physicians involved in your care Any changes since last visit?  yes Primary care Dr. Abelino Derrick   Family History  Problem Relation Age of Onset  . Colon cancer Brother 48  . Colon polyps Brother   . Cancer Brother        lung cancer, deceased  . Other Mother        MVA, deceased 81s  . Healthy Daughter   . Healthy Son   . Rectal cancer Neg Hx   . Stomach cancer Neg Hx     Social History   Socioeconomic History  . Marital status: Married    Spouse name: Not on file  . Number of children: Not on file  . Years of education: Not on file  . Highest education level: Not on file  Occupational History    Employer: [employer]  . Financial resource strain: Not on file  . Food insecurity    Worry: Not on file    Inability: Not on file  . Transportation needs    Medical: Not on file    Non-medical: Not on file  Tobacco Use  . Smoking status: Former Smoker    Packs/day: 2.00    Years: 31.00    Pack years: 62.00    Types: Cigarettes    Quit date: 10/23/1982    Years since quitting: 36.5  . Smokeless tobacco: Never Used  Substance and Sexual Activity  . Alcohol use: No    Alcohol/week: 0.0 standard drinks  . Drug use: No  . Sexual activity: Not on file  Lifestyle  . Physical activity    Days per week: Not on file    Minutes per session: Not on file  . Stress: Not on file  Relationships  . Social connections    Talks on phone: Not on file  Gets together: Not on file    Attends religious service: Not on file    Active member of club or organization: Not on file    Attends meetings of clubs or organizations: Not on file    Relationship status: Not on file  Other Topics Concern  . Not on file  Social History Narrative   Lives with wife in a one story home.  Has a son and a daughter.     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     Education: 2 years of college.   Past Surgical History:  Procedure Laterality Date  . Abdominal US  09/1997  . arm fracture Left 1958   with hardware  . Colon cancer screening    . COLONOSCOPY  08/18/2004   diverticulitis  . COLONOSCOPY  09/24/2009  . ELECTROCARDIOGRAM  02/2006  . FLEXIBLE SIGMOIDOSCOPY  03/04/2001   polyps, anal fissure  . GANGLION CYST EXCISION Left 1980  . Lower Arterial  04/13/2004  . POLYPECTOMY    . Rest Cardiolite  03/19/2003   Past Medical History:   Diagnosis Date  . Allergy   . Anal fissure   . Anemia, unspecified   . Arthritis    Spinal OA  . BACK PAIN, LUMBAR 10/23/2007  . CARDIAC MURMUR, SYSTOLIC 12/29/7562  . DIABETES MELLITUS, TYPE I 05/20/2007  . DIASTOLIC DYSFUNCTION 3/32/9518  . 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 Duodenitis   . Edema 05/12/2008  . Esophageal stricture   . GERD (gastroesophageal reflux disease)   . 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 Gynecomastia   . Hepatic steatosis   . HYPERLIPIDEMIA 05/20/2007  . Hypertension   . Hypogonadism male   . Morbid obesity ([place]) 09/10/2009  . OBSTRUCTIVE SLEEP APNEA 11/04/2007  . Personal history of colonic polyps   . RENAL INSUFFICIENCY 05/12/2008  . Sleep apnea    uses cpap  . Urolithiasis    BP (!) 144/80   Pulse 71   Temp (!) 97.3 F (36.3 C)   Ht 6' (1.829 m)   Wt 278 lb (126.1 kg)   SpO2 96%   BMI 37.70 kg/m   Opioid Risk Score:   Fall Risk Score:  `1  Depression screen PHQ 2/9  Depression screen [facility] 2/9 09/18/2018 09/20/2017 08/31/2016  Decreased Interest 0 0 0  Down, Depressed, Hopeless 0 0 0  PHQ - 2 Score 0 0 0  Altered sleeping - - 0  Tired, decreased energy - - 1  Change in appetite - - 0  Feeling bad or failure about yourself  - - 0  Trouble concentrating - - 0  Moving slowly or fidgety/restless - - 0  Suicidal thoughts - - 0  PHQ-9 Score - - 1  Difficult doing work/chores - - Somewhat difficult  Some recent data might be hidden    Review of Systems  HENT: Negative.   Eyes: Negative.   Respiratory: Positive for apnea and shortness of breath.   Cardiovascular: Negative.   Gastrointestinal: Negative.   Endocrine:       Diabetic High blood sugar  Genitourinary: Negative.           Musculoskeletal: Positive for arthralgias, back pain, myalgias and neck pain.  Skin: Negative.   Allergic/Immunologic: Negative.   Neurological: Positive for weakness and numbness.       Tingling  Hematological: Negative.   All other systems  reviewed and are negative.     Objective:   Physical  Exam Constitutional: No distress . Vital signs reviewed. HENT: Normocephalic.  Atraumatic. Eyes: EOMI. No discharge. Cardiovascular: No JVD. Respiratory: Normal effort. GI: Non-distended. MSK:  Gait mildly antalgic  +Mild TTP right SI joint  No edema.   ROM WNL Neuro: Alert  Strength  5/5 in all LE myotomes Skin: Warm and Dry. Intact.     Assessment & Plan:  Male with pmh of OSA, CKD, HTN, DM type 1 with peripheral neuropathy presents for follow up of low back pain > neck pain.    1. Chronic mechanical low back pain  MRIs C,L-spine early 2016 suggesting multilevel spondylosis with mild b/l multilevel foraminal narrowing C4-C7 and shallow disc bulge at L5-S1 without central canal or foraminal stenosis.  Avoid NSAIDs due to CKD  Unable to tolerate Gabapentin, Lyrica  Voltaren gel denied by insurance   Cont HEP  Cont TENs  Aquatic therapy, not going anymore, believes he can just live with the pain and that he is lazy  Cont tylenol  Cont Robaxin 500 TID PRN, changed to Baclofen 10 TID PRN due to change insurance  Cont Cymbalta 60mg   Cont back brace during periods of excessive activity, reminded to avoid prolonged use  Cont Lidoderm OTC  Acknowledges he can do more if he make a conscious effort  PT ordered  2. Sacroiliitis  See #1  Cont brace  Not main pain generator at present  3. Sleep disturbance  Recently new mattress  Cont CPAP  Cont Elavil to 25qhs  4. Myalgia  Good benefit with trigger point injections for a short period  No need to repeat at present  5. Diabetic neuropathy  See #1, #3  6. Morbid obesity  Not interested in seeing dietitian at this time  Weight has increased  7. Gait abnormality  Therapies ordered  Cont cane for safety

## 2019-05-05 ENCOUNTER — Encounter: Payer: Self-pay | Admitting: Physical Therapy

## 2019-05-05 ENCOUNTER — Other Ambulatory Visit: Payer: Self-pay

## 2019-05-05 ENCOUNTER — Ambulatory Visit: Payer: Medicare Other | Attending: Physical Medicine & Rehabilitation | Admitting: Physical Therapy

## 2019-05-05 DIAGNOSIS — R296 Repeated falls: Secondary | ICD-10-CM | POA: Diagnosis present

## 2019-05-05 DIAGNOSIS — M545 Low back pain: Secondary | ICD-10-CM | POA: Insufficient documentation

## 2019-05-05 DIAGNOSIS — G8929 Other chronic pain: Secondary | ICD-10-CM | POA: Diagnosis present

## 2019-05-05 DIAGNOSIS — M6281 Muscle weakness (generalized): Secondary | ICD-10-CM | POA: Insufficient documentation

## 2019-05-05 NOTE — Therapy (Signed)
[place], [place], [address] Phone: [phone]   Fax:  [phone]  Physical Therapy Evaluation  Patient Details  Name: [name] MRN: [id number] Date of Birth: May 07, 1941 Referring Provider (PT): Dr. Posey Pronto    Encounter Date: 05/05/2019  PT End of Session - 05/05/19 1842    Visit Number  1    Number of Visits  16    Date for PT Re-Evaluation  06/30/19    PT Start Time  2751    PT Stop Time  1535    PT Time Calculation (min)  42 min    Activity Tolerance  Patient tolerated treatment well    Behavior During Therapy  [facility] for tasks assessed/performed       Past Medical History:  Diagnosis Date  . Allergy   . Anal fissure   . Anemia, unspecified   . Arthritis    Spinal OA  . BACK PAIN, LUMBAR 10/23/2007  . CARDIAC MURMUR, SYSTOLIC 7/0/0174  . DIABETES MELLITUS, TYPE I 05/20/2007  . DIASTOLIC DYSFUNCTION 9/44/9675  . 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 Duodenitis   . Edema 05/12/2008  . Esophageal stricture   . GERD (gastroesophageal reflux disease)   . 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 Gynecomastia   . Hepatic steatosis   . HYPERLIPIDEMIA 05/20/2007  . Hypertension   . Hypogonadism male   . Morbid obesity ([place]) 09/10/2009  . OBSTRUCTIVE SLEEP APNEA 11/04/2007  . Personal history of colonic polyps   . RENAL INSUFFICIENCY 05/12/2008  . Sleep apnea    uses cpap  . Urolithiasis     Past Surgical History:  Procedure Laterality Date  . Abdominal US  09/1997  . arm fracture Left 1958   with hardware  . Colon cancer screening    . COLONOSCOPY  08/18/2004   diverticulitis  . COLONOSCOPY  09/24/2009  . ELECTROCARDIOGRAM  02/2006  . FLEXIBLE SIGMOIDOSCOPY  03/04/2001   polyps, anal fissure  . GANGLION CYST EXCISION Left 1980  . Lower Arterial  04/13/2004  . POLYPECTOMY    . Rest Cardiolite  03/19/2003    There were no vitals filed for this visit.   Subjective Assessment - 05/05/19 1455     Subjective  Patient here for low back pain as well as increased frequency of falls. He has difficulty getting up from the floor.  This has been going on for 2 years.  He has tried PT before with only min results, he felt stronger in his upper body.  He has lower body joint pain which limits his walking, stair mobility. He has had some radiculopathy in LLE from time to time.  He fell last week and landing on his Rt hip whicih is the only thing really bothering him today.    Pertinent History  diabetes, obesity, HTN, GERD, neuropathy, knee OA    Limitations  Sitting;Lifting;Standing;House hold activities;Walking    How long can you sit comfortably?  not an issue    How long can you stand comfortably?  10 min    How long can you walk comfortably?  not long at allm knees and hips hurt walking 150 feet to mailbox    Diagnostic tests  Long ago MRI    Patient Stated Goals  stop falling and get up from the floor    Currently in Pain?  Yes    Pain Score  5     Pain Location  Hip  Pain Orientation  Right    Pain Descriptors / Indicators  Aching;Sore    Pain Type  Chronic pain    Pain Onset  More than a month ago    Pain Frequency  Intermittent    Aggravating Factors   fell on it about a week ago    Pain Relieving Factors  sitting helps the back.  Rt hip hurts all the time since the fall    Effect of Pain on Daily Activities  yard work is out of the question , standing         [facility] PT Assessment - 05/05/19 0001      Assessment   Medical Diagnosis  chronic low back pain, sacroilitis, gait abnormality      Referring Provider (PT)  Dr. Posey Pronto     Onset Date/Surgical Date  --   chronic    Next MD Visit  not sure     Prior Therapy  Yes       Precautions   Precautions  None    Precaution Comments  falls, wears insulin pump on R hip       Restrictions   Weight Bearing Restrictions  No      Balance Screen   Has the patient fallen in the past 6 months  Yes    How many times?  5    Has  the patient had a decrease in activity level because of a fear of falling?   Yes    Is the patient reluctant to leave their home because of a fear of falling?   No      Home Film/video editor residence    Living Arrangements  Spouse/significant other;Other relatives    Type of [place] to enter    Entrance Stairs-Number of Steps  1    [place]  One level    [place] - single point    Additional Comments  lives with spouse , has 2 kids       Prior Function   Level of Independence  Independent    Vocation  Retired    Actor     Leisure  fishing       Cognition   Overall Cognitive Status  Within Functional Limits for tasks assessed      Observation/Other Assessments   Focus on Therapeutic Outcomes (FOTO)   56%      Sensation   Light Touch  Appears Intact    Additional Comments  has neuropathy in feet       Coordination   Gross Motor Movements are Fluid and Coordinated  Not tested      Functional Tests   Functional tests  Step up      Squat   Comments  WFL shallow       Step Up   Comments  needs UE support 8 inch       Posture/Postural Control   Posture/Postural Control  Postural limitations    Postural Limitations  Rounded Shoulders;Forward head;Flexed trunk      AROM   Lumbar Flexion  bends knees, reaches close to floor, min pain     Lumbar Extension  25%    Lumbar - Right Side Bend  50%    Lumbar - Left Side Bend  50%    Lumbar - Right Rotation  [facility]    Lumbar - Left Rotation  [facility]  Strength   Right Hip Flexion  4/5    Right Hip ABduction  4/5    Left Hip Flexion  4/5    Left Hip ABduction  4/5    Right Knee Flexion  4+/5    Right Knee Extension  5/5    Left Knee Flexion  4+/5    Left Knee Extension  5/5    Right Ankle Dorsiflexion  4+/5    Left Ankle Dorsiflexion  4+/5      Flexibility   Hamstrings  tight    Piriformis  tight       Palpation   Palpation  comment  Pain L4-L5-S1 on Rt side, gluteals and on Greater trochanter , lateral hip       Bed Mobility   Bed Mobility  Rolling Right;Rolling Left;Right Sidelying to Sit;Sit to Supine   all Mod I      Ambulation/Gait   Gait Comments  no device, wide BOS, no noticeable ankle or knee weakness, pain increased to 5/10 within 5 min walking in gym          Objective measurements completed on examination: See above findings.     PT Education - 05/05/19 1842    Education provided  Yes    Education Details  PT/POC, HEP, posture    Person(s) Educated  Patient    Methods  Explanation;Demonstration;Handout    Comprehension  Verbalized understanding;Returned demonstration;Need further instruction       PT Short Term Goals - 05/05/19 1843      PT SHORT TERM GOAL #1   Title  independent with initial HEP    Time  4    Period  Weeks    Status  New    Target Date  06/02/19      PT SHORT TERM GOAL #2   Title  Pt will be screened for balance and goal set related to score.    Time  2    Period  Weeks    Status  New    Target Date  05/19/19      PT SHORT TERM GOAL #3   Title  Pt will be able to show proper lifting and body mechanics during session to preserve low back    Time  4    Period  Weeks    Status  New    Target Date  06/02/19        PT Long Term Goals - 05/05/19 2012      PT LONG TERM GOAL #1   Title  Pt will be able to stand for 15 min to do the dishes, home tasks with pain in back < 3/10 most of the time.    Time  8    Period  Weeks    Status  New    Target Date  06/30/19      PT LONG TERM GOAL #2   Title  Pt will be able to walk to and from his mailbox with pain in LEs and back <3/10    Time  8    Period  Weeks    Status  New    Target Date  06/30/19      PT LONG TERM GOAL #3   Title  Pt will be able to improve dynamic balance TBA based on initial test.    Time  8    Period  Weeks    Status  New    Target Date  06/30/19  PT LONG TERM GOAL #4    Title  Pt will score no more than 45% limited on FOTO to demo improved functional mobility.    Time  8    Period  Weeks    Status  New    Target Date  06/30/19      PT LONG TERM GOAL #5   Title  Pt will be I with HEP for posture, hip, trunk and balance    Time  8    Period  Weeks    Status  New    Target Date  06/30/19      Additional Long Term Goals   Additional Long Term Goals  Yes      PT LONG TERM GOAL #6   Title  Pt will get up from the floor independently without external support in case of falls    Time  8    Period  Weeks    Status  New    Target Date  06/30/19             Plan - 05/05/19 2016    Clinical Impression Statement  Pt presents for mod complexity evaluation of low back pain and recent increase in falls over the past 6 mos.  He would benefit from skilled PT to improve his gait stability, core strength and hip/trunk flexibility.  He has multiple co-morbidities that have the potential to improve with exercise and lifestyle changes.  He is resistant to exercise but does realize that it may be something he needs  to do.    Personal Factors and Comorbidities  Behavior Pattern;Fitness;Past/Current Experience;Comorbidity 1;Comorbidity 2;Comorbidity 3+;Time since onset of injury/illness/exacerbation    Comorbidities  obesity, diabetes, joint pain , see snapshot    Examination-Activity Limitations  Locomotion Level;Transfers;Bend;Squat;Stand;Lift;Stairs    Stability/Clinical Decision Making  Evolving/Moderate complexity    Clinical Decision Making  Moderate    Rehab Potential  Good    PT Frequency  2x / week    PT Duration  8 weeks    PT Treatment/Interventions  ADLs/Self Care Home Management;Electrical Stimulation;Moist Heat;Traction;Ultrasound;Patient/family education;Therapeutic exercise;Therapeutic activities;Manual techniques;Gait training;Balance training;Dry needling;Functional mobility training       Patient will benefit from skilled therapeutic  intervention in order to improve the following deficits and impairments:  Decreased activity tolerance, Decreased mobility, Decreased strength, Decreased range of motion, Difficulty walking, Impaired flexibility, Increased muscle spasms, Improper body mechanics, Pain, Postural dysfunction, Obesity, Decreased balance, Abnormal gait  Visit Diagnosis: 1. Repeated falls   2. 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 sciatica   3. Muscle weakness (generalized)        Problem List Patient Active Problem List   Diagnosis Date Noted  . Sleep disturbance 05/01/2019  . Fall (on)(from) sidewalk curb, initial encounter 04/18/2019  . Vitamin D deficiency 04/18/2019  . 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 Excessive cerumen in left ear canal 10/29/2018  . Trigger finger, right ring finger 08/13/2018  . Chest wall muscle strain 07/30/2018  . Need for influenza vaccination 07/30/2018  . Grieving 06/25/2018  . ETD (Eustachian tube dysfunction), left 06/25/2018  . Unilateral primary osteoarthritis, right knee 11/17/2016  . Complex tear of lateral meniscus of right knee as current injury 10/26/2016  . Radiculitis, lumbosacral 06/30/2016  . Diabetes ([place]) 01/01/2016  . Solitary pulmonary nodule 12/30/2015  . Urolithiasis 02/23/2015  . Hearing loss 11/25/2014  . Paresthesia 09/07/2014  . Routine general medical examination at a health care facility 07/07/2014  . Disturbance of skin sensation 12/26/2013  .  UTI (urinary tract infection) 03/12/2013  . Screening for prostate cancer 02/14/2012  . Right knee pain 11/15/2011  . Encounter for long-term (current) use of other medications 01/17/2011  . Special screening examination for neoplasm of prostate 01/17/2011  . MYCOSIS FUNGOIDES 12/29/2009  . HEARING LOSS 09/30/2009  . ECZEMA 09/30/2009  . VITAMIN B12 DEFICIENCY 09/10/2009  . MORBID OBESITY 09/10/2009  . SYNCOPE 05/03/2009  . DIASTOLIC DYSFUNCTION 23/30/0762  . CARDIAC MURMUR, SYSTOLIC 26/33/3545  .  Pancytopenia ([place]) 05/12/2008  . Disorder resulting from impaired renal function 05/12/2008  . EDEMA 05/12/2008  . Obstructive sleep apnea 11/04/2007  . BACK PAIN, LUMBAR 10/23/2007  . Dyslipidemia 05/20/2007  . 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 Essential hypertension 05/20/2007    Rupa Lagan 05/05/2019, 8:28 PM  [facility] [address] [place], [place], [address] Phone: [phone]   Fax:  [phone]  Name: [name] MRN: [id number] Date of Birth: Oct 05, 1941   Raeford Razor, PT 05/05/19 8:28 PM Phone: [phone] Fax: [phone]

## 2019-05-05 NOTE — Patient Instructions (Addendum)
Seated piriformis and hamstring stretch, sidelying clam (2x 10) 2x day x 3 , 30 sec hold

## 2019-05-08 ENCOUNTER — Encounter: Payer: Self-pay | Admitting: Internal Medicine

## 2019-05-08 ENCOUNTER — Ambulatory Visit (INDEPENDENT_AMBULATORY_CARE_PROVIDER_SITE_OTHER): Payer: Medicare Other

## 2019-05-08 ENCOUNTER — Other Ambulatory Visit: Payer: Self-pay

## 2019-05-08 ENCOUNTER — Ambulatory Visit (INDEPENDENT_AMBULATORY_CARE_PROVIDER_SITE_OTHER): Payer: Medicare Other | Admitting: Internal Medicine

## 2019-05-08 VITALS — BP 116/62 | HR 77 | Temp 98.1°F | Ht 72.0 in | Wt 275.2 lb

## 2019-05-08 DIAGNOSIS — R918 Other nonspecific abnormal finding of lung field: Secondary | ICD-10-CM

## 2019-05-08 DIAGNOSIS — G4733 Obstructive sleep apnea (adult) (pediatric): Secondary | ICD-10-CM

## 2019-05-08 NOTE — Patient Instructions (Addendum)
We can continue CPAP 11.6, mask of choice,humidifier,supplies, AirVew/ card  Order- CXR   Dx small lung nodules  Please call if we can help

## 2019-05-08 NOTE — Assessment & Plan Note (Signed)
Too heavy. He understands this impacts his OSA and other problems. Strongly encouraged to make an effort to lose  Weight.

## 2019-05-08 NOTE — Assessment & Plan Note (Signed)
Small nodules, probably benign as of CT 2017, Plan- CXR

## 2019-05-08 NOTE — Progress Notes (Signed)
   Subjective:    Patient ID: [name], male    DOB: 1941-02-01, 78 y.o.   MRN: [id number]  HPI   male followed for OSA, complicated by obesity, HBP, DM 2, diastolic dysfunction, GERD NPSG 2004 AHI 9/hr  --------------------------------------------------------------------------------------------  01/18/18- 78 year old male followed for OSA, lung nodules,  complicated by obesity, HBP, DM 2, diastolic dysfunction, GERD CPAP 11.6/[facility] ----OSA: DME: [facility]. Pt wears CPAP nightly and DL attached. Pt will need order for new supplies.  Mask leaks des[pite adjustments. Download 100% compliance AHI 3.4/hour.  He notices some mask leak but it is not keeping him awake.  Has done several mask fitting sessions.  Chinstrap was annoying. Chest feels fine with no cough. CT chest 12/31/15 Small bilateral lung nodules are stable and likely benign given over 1 year of stability. No other nodules present. Mild lingular scarring Cholelithiasis  05/08/2019-  78 year old male followed for OSA, lung nodules,  complicated by obesity, HBP, DM 2, diastolic dysfunction, GERD CPAP 11.6/[facility] -----OSA on CPAP 11.6, DME: [facility]; no complaints, using machine every night Download compliance 100%, AHI 2.7/ hr Lung nodules considered benign on CT 2017. Body weight today 275 lbs Very happy and comfortable with his CPAP- sleeps well. No significant cough or breathing complaint. Being evaluated for falls  Review of Systems + = Positive Constitutional: Negative for fever and unexpected weight change.  HENT: Negative for congestion, dental problem, ear pain, nosebleeds, postnasal drip, rhinorrhea, sinus pressure, sneezing, sore throat and trouble swallowing.   Eyes: Negative for redness and itching.  Respiratory: Negative for cough, chest tightness, shortness of breath and wheezing.   Cardiovascular: Negative for palpitations and leg swelling.  Gastrointestinal: Negative for  nausea and vomiting.  Genitourinary: Negative for dysuria.  Musculoskeletal: Negative for joint swelling.  Skin: Negative for rash.  Neurological: Negative for headaches. + falls Hematological: Does not bruise/bleed easily.  Psychiatric/Behavioral: Negative for dysphoric mood. The patient is not nervous/anxious.    Objective:  OBJ- Physical Exam General- Alert, Oriented, Affect-appropriate, Distress- none acute, + obese Skin- rash-none, lesions- none, excoriation- none Lymphadenopathy- none Head- atraumatic            Eyes- Gross vision intact, PERRLA, conjunctivae and secretions clear            Ears- Hearing, canals-normal            Nose- Clear, no-Septal dev, mucus, polyps, erosion, perforation             Throat- Mallampati III , mucosa clear , drainage- none, tonsils- atrophic Neck- flexible , trachea midline, no stridor , thyroid nl, carotid no bruit Chest - symmetrical excursion , unlabored           Heart/CV- RRR , no murmur , no gallop  , no rub, nl s1 s2                           - JVD- none , edema- none, stasis changes- none, varices- none           Lung- clear to P&A, wheeze- none, cough- none , dullness-none, rub- none           Chest wall-  Abd-  Br/ Gen/ Rectal- Not done, not indicated Extrem- cyanosis- none, clubbing, none, atrophy- none, strength- nl Neuro- grossly intact to observation    Assessment & Plan:

## 2019-05-08 NOTE — Assessment & Plan Note (Signed)
Continues to benefit from CPAP, confirmed by good compliance and control Plan- continue CPAP 11.6   AirSense 10 Autoset machine

## 2019-05-09 ENCOUNTER — Other Ambulatory Visit: Payer: Self-pay

## 2019-05-12 ENCOUNTER — Encounter: Payer: Self-pay | Admitting: Family Medicine

## 2019-05-12 ENCOUNTER — Other Ambulatory Visit: Payer: Self-pay | Admitting: Endocrinology

## 2019-05-12 ENCOUNTER — Other Ambulatory Visit: Payer: Self-pay | Admitting: *Deleted

## 2019-05-12 MED ORDER — AMITRIPTYLINE HCL 25 MG PO TABS: 25.0000 mg | ORAL_TABLET | Freq: Every day | ORAL | 1 refills | Status: DC

## 2019-05-12 MED ORDER — FUROSEMIDE 20 MG PO TABS: 20.0000 mg | ORAL_TABLET | Freq: Every day | ORAL | 2 refills | Status: DC

## 2019-05-12 MED ORDER — ROSUVASTATIN CALCIUM 40 MG PO TABS: 40.0000 mg | ORAL_TABLET | Freq: Every day | ORAL | 2 refills | Status: DC

## 2019-05-12 NOTE — Telephone Encounter (Signed)
Please forward refill request to pt's new primary care provider.  

## 2019-05-12 NOTE — Telephone Encounter (Signed)
Please advise if refill request is appropriate

## 2019-05-12 NOTE — Telephone Encounter (Signed)
At Dr. Ellison's request, I am forwarding you this patient's refill request. Please review and refill if you deem appropriate. 

## 2019-05-13 ENCOUNTER — Encounter: Payer: Self-pay | Admitting: Endocrinology

## 2019-05-13 ENCOUNTER — Encounter: Payer: Self-pay | Admitting: Physical Therapy

## 2019-05-13 ENCOUNTER — Ambulatory Visit (INDEPENDENT_AMBULATORY_CARE_PROVIDER_SITE_OTHER): Payer: Medicare Other | Admitting: Endocrinology

## 2019-05-13 ENCOUNTER — Other Ambulatory Visit: Payer: Self-pay

## 2019-05-13 ENCOUNTER — Ambulatory Visit: Payer: Medicare Other | Admitting: Physical Therapy

## 2019-05-13 VITALS — BP 134/76 | HR 73 | Ht 72.0 in | Wt 274.8 lb

## 2019-05-13 DIAGNOSIS — Z794 Long term (current) use of insulin: Secondary | ICD-10-CM

## 2019-05-13 DIAGNOSIS — E1159 Type 2 diabetes mellitus with other circulatory complications: Secondary | ICD-10-CM | POA: Diagnosis not present

## 2019-05-13 DIAGNOSIS — R296 Repeated falls: Secondary | ICD-10-CM | POA: Diagnosis not present

## 2019-05-13 DIAGNOSIS — E0800 Diabetes mellitus due to underlying condition with hyperosmolarity without nonketotic hyperglycemic-hyperosmolar coma (NKHHC): Secondary | ICD-10-CM

## 2019-05-13 DIAGNOSIS — E1129 Type 2 diabetes mellitus with other diabetic kidney complication: Secondary | ICD-10-CM | POA: Diagnosis not present

## 2019-05-13 DIAGNOSIS — G8929 Other chronic pain: Secondary | ICD-10-CM

## 2019-05-13 DIAGNOSIS — M6281 Muscle weakness (generalized): Secondary | ICD-10-CM

## 2019-05-13 LAB — POCT GLYCOSYLATED HEMOGLOBIN (HGB A1C): Hemoglobin A1C: 6.7 % — AB (ref 4.0–5.6)

## 2019-05-13 NOTE — Progress Notes (Signed)
Subjective:    Patient ID: [name], male    DOB: 1940-11-03, 78 y.o.   MRN: [id number]  HPI Pt returns for f/u of diabetes mellitus: DM type: Insulin-requiring type 2 Dx'ed: 3833 Complications: polyneuropathy, CAD, and renal insufficiency.  Therapy: insulin since 1995.  DKA: never.  Severe hypoglycemia: never.  Pancreatitis: never.  Other: he started pump therapy (now accucheck, since mid-2015), and dexcom continuous glucose monitor.   Interval history: he takes these pump settings:   basal rate of 2.6 units/hr, 24 hrs per day.   mealtime bolus of 1 unit/3 grams carbohydrate.  However, subtract 10 units from your calculated lunch bolus.  continue correction bolus (which some people call "sensitivity," or "insulin sensitivity ratio," or just "isr") of 1 unit for each 10 by which your glucose exceeds 100.   TDD is approx 124 units (49% of which is basal)   I reviewed continuous glucose monitor data.  Glucose varies from 100-240 (but pt says cbg is as low as 70).  It is in general highest after supper, but there is not much trend throughout the day.   Past Medical History:  Diagnosis Date  . Allergy   . Anal fissure   . Anemia, unspecified   . Arthritis    Spinal OA  . BACK PAIN, LUMBAR 10/23/2007  . CARDIAC MURMUR, SYSTOLIC 12/28/3289  . DIABETES MELLITUS, TYPE I 05/20/2007  . DIASTOLIC DYSFUNCTION 07/09/6059  . 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[place])   . Duodenitis   . Edema 05/12/2008  . Esophageal stricture   . GERD (gastroesophageal reflux disease)   . 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 Gynecomastia   . Hepatic steatosis   . HYPERLIPIDEMIA 05/20/2007  . Hypertension   . Hypogonadism male   . Morbid obesity ([place]) 09/10/2009  . OBSTRUCTIVE SLEEP APNEA 11/04/2007  . Personal history of colonic polyps   . RENAL INSUFFICIENCY 05/12/2008  . Sleep apnea    uses cpap  . Urolithiasis     Past Surgical History:  Procedure Laterality Date  . Abdominal US  09/1997  . arm fracture  Left 1958   with hardware  . Colon cancer screening    . COLONOSCOPY  08/18/2004   diverticulitis  . COLONOSCOPY  09/24/2009  . ELECTROCARDIOGRAM  02/2006  . FLEXIBLE SIGMOIDOSCOPY  03/04/2001   polyps, anal fissure  . GANGLION CYST EXCISION Left 1980  . Lower Arterial  04/13/2004  . POLYPECTOMY    . Rest Cardiolite  03/19/2003    Social History   Socioeconomic History  . Marital status: Married    Spouse name: Not on file  . Number of children: Not on file  . Years of education: Not on file  . Highest education level: Not on file  Occupational History    Employer: [employer]  . Financial resource strain: Not on file  . Food insecurity    Worry: Not on file    Inability: Not on file  . Transportation needs    Medical: Not on file    Non-medical: Not on file  Tobacco Use  . Smoking status: Former Smoker    Packs/day: 2.00    Years: 31.00    Pack years: 62.00    Types: Cigarettes    Quit date: 10/23/1982    Years since quitting: 36.5  . Smokeless tobacco: Never Used  Substance and Sexual Activity  . Alcohol use: No    Alcohol/week: 0.0 standard drinks  . Drug use: No  .  Sexual activity: Not on file  Lifestyle  . Physical activity    Days per week: Not on file    Minutes per session: Not on file  . Stress: Not on file  Relationships  . Social Herbalist on phone: Not on file    Gets together: Not on file    Attends religious service: Not on file    Active member of club or organization: Not on file    Attends meetings of clubs or organizations: Not on file    Relationship status: Not on file  . Intimate partner violence    Fear of current or ex partner: Not on file    Emotionally abused: Not on file    Physically abused: Not on file    Forced sexual activity: Not on file  Other Topics Concern  . Not on file  Social History Narrative   Lives with wife in a one story home.  Has a son and a daughter.     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     Education: 2 years of college.    Current Outpatient Medications on File Prior to Visit  Medication Sig Dispense Refill  . Accu-Chek FastClix Lancets MISC USE TO CHECK BLOOD SUGAR UP TO 6 TIMES DAILY 612 each 3  . alfuzosin (UROXATRAL) 10 MG 24 hr tablet Take 1 tablet (10 mg total) by mouth daily with breakfast. 90 tablet 2  . allopurinol (ZYLOPRIM) 300 MG tablet TAKE 1 TABLET BY MOUTH  DAILY 90 tablet 3  . amitriptyline (ELAVIL) 25 MG tablet Take 1 tablet (25 mg total) by mouth at bedtime. 90 tablet 1  . aspirin (BAYER LOW STRENGTH) 81 MG EC tablet Take 81 mg by mouth daily.      . Blood Glucose Monitoring Suppl (ACCU-CHEK AVIVA PLUS) w/Device KIT Use as directed 1 kit 0  . carvedilol (COREG) 3.125 MG tablet TAKE 1/2 TABLETS BY MOUTH 2 TIMES DAILY WITH MEALS. 90 tablet 2  . Continuous Blood Gluc Sensor (DEXCOM G6 SENSOR) MISC Inject 1 Device into the skin as directed. Apply to skin SQ every 10 days 9 each 3  . cycloSPORINE (RESTASIS) 0.05 % ophthalmic emulsion Place 1 drop into both eyes 2 (two) times daily.      . diclofenac sodium (VOLTAREN) 1 % GEL Apply 2 g topically 4 (four) times daily. 1 Tube 1  . DULoxetine (CYMBALTA) 60 MG capsule TAKE 1 CAPSULE BY MOUTH  DAILY 30 capsule 3  . fluticasone (FLONASE) 50 MCG/ACT nasal spray Place 1 spray into the nose daily as needed for allergies.     . folic acid (FOLVITE) 1 MG tablet Take 2 mg by mouth 2 (two) times daily. 4 tabs daily    . furosemide (LASIX) 20 MG tablet Take 1 tablet (20 mg total) by mouth daily. 90 tablet 2  . glucose blood (ACCU-CHEK AVIVA PLUS) test strip USE TO TEST BLOOD SUGAR 6  TIMES PER DAY; E11.9 600 each 11  . Insulin Infusion Pump Supplies (AUTOSOFT XC INFUSION SET) MISC Inject 1 Act into the skin every other day. Use with 63m cannula and 23 inch tubing. *5 boxes (90 day supply) 5 each 3  . Insulin Infusion Pump Supplies (T:SLIM INSULIN CARTRIDGE 3ML) MISC Use with insulin pump, fill  once every 2 days. 5 each 3  . insulin lispro (HUMALOG) 100 UNIT/ML injection Inject 1.1 mLs (110 Units total) into the skin daily. (Patient taking differently: Inject  130 Units into the skin daily. ) 100 mL 0  . mometasone (ELOCON) 0.1 % lotion APPLY TOPICALLY DAILY 60 mL 11  . Multiple Vitamins-Minerals (CENTRUM SILVER PO) Take 1 tablet by mouth daily.      . Omega-3 Fatty Acids (FISH OIL) 1200 MG CAPS Take 1,200 mg by mouth daily.     Marland Kitchen omeprazole (PRILOSEC) 20 MG capsule Take 1 capsule (20 mg total) by mouth daily as needed. 90 capsule 2  . rosuvastatin (CRESTOR) 40 MG tablet Take 1 tablet (40 mg total) by mouth at bedtime. 90 tablet 2  . vitamin B-12 (CYANOCOBALAMIN) 1000 MCG tablet Take 1,000 mcg by mouth daily.    . Vitamin D, Ergocalciferol, (DRISDOL) 1.25 MG (50000 UT) CAPS capsule Take 1 capsule (50,000 Units total) by mouth every 7 (seven) days. 5 capsule 6   No current facility-administered medications on file prior to visit.     Allergies  Allergen Reactions  . Atorvastatin Other (See Comments)    unknown  . Niacin     REACTION: Severe heartburn  . Pioglitazone     REACTION: Edema    Family History  Problem Relation Age of Onset  . Colon cancer Brother 20  . Colon polyps Brother   . Cancer Brother        lung cancer, deceased  . Other Mother        MVA, deceased 21s  . Healthy Daughter   . Healthy Son   . Rectal cancer Neg Hx   . Stomach cancer Neg Hx     BP 134/76 (BP Location: Right Arm, Patient Position: Sitting, Cuff Size: Large)   Pulse 73   Ht 6' (1.829 m)   Wt 274 lb 12.8 oz (124.6 kg)   SpO2 94%   BMI 37.27 kg/m    Review of Systems He denies hypoglycemia.      Objective:   Physical Exam VITAL SIGNS:  See vs page GENERAL: no distress Pulses: dorsalis pedis intact bilat.   MSK: no deformity of the feet CV: no leg edema Skin:  no ulcer on the feet.  normal color and temp on the feet. Neuro: sensation is intact to touch on the feet  Lab  Results  Component Value Date   HGBA1C 6.7 (A) 05/13/2019   Lab Results  Component Value Date   CREATININE 1.71 (H) 01/08/2019   BUN 22 01/08/2019   NA 139 01/08/2019   K 3.1 (L) 01/08/2019   CL 99 01/08/2019   CO2 32 01/08/2019        Assessment & Plan:  Insulin-requiring type 2 DM, with CAD: The pattern of his cbg's indicates he needs some adjustment in his therapy Hypoglycemia: This limits rx options Renal insuff: in this setting, a lower % of TDD of daily insulin should be as basal insulin.  Patient Instructions  Please take these pump settings:  basal rate of 2.6 units/hr, 24 hrs per day.   mealtime bolus of 1 unit/2 grams carbohydrate.  However, subtract 14 units from your calculated lunch bolus.  continue correction bolus (which some people call "sensitivity," or "insulin sensitivity ratio," or just "isr") of 1 unit for each 10 by which your glucose exceeds 100.   Please come back for a follow-up appointment in 3 months.

## 2019-05-13 NOTE — Patient Instructions (Addendum)
Please take these pump settings:  basal rate of 2.6 units/hr, 24 hrs per day.   mealtime bolus of 1 unit/2 grams carbohydrate.  However, subtract 14 units from your calculated lunch bolus.  continue correction bolus (which some people call "sensitivity," or "insulin sensitivity ratio," or just "isr") of 1 unit for each 10 by which your glucose exceeds 100.   Please come back for a follow-up appointment in 3 months.

## 2019-05-13 NOTE — Therapy (Signed)
[place], [place], [address] Phone: [phone]   Fax:  [phone]  Physical Therapy Treatment  Patient Details  Name: [name] MRN: [id number] Date of Birth: [birth year] Referring Provider (PT): Dr. Posey Pronto    Encounter Date: 05/13/2019  PT End of Session - 05/13/19 1331    Visit Number  2    Number of Visits  16    Date for PT Re-Evaluation  06/30/19    PT Start Time  4627    PT Stop Time  1415    PT Time Calculation (min)  60 min    Activity Tolerance  Patient tolerated treatment well    Behavior During Therapy  [facility] for tasks assessed/performed       Past Medical History:  Diagnosis Date  . Allergy   . Anal fissure   . Anemia, unspecified   . Arthritis    Spinal OA  . BACK PAIN, LUMBAR 10/23/2007  . CARDIAC MURMUR, SYSTOLIC 0/12/5007  . DIABETES MELLITUS, TYPE I 05/20/2007  . DIASTOLIC DYSFUNCTION 3/81/8299  . 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[place])   . Duodenitis   . Edema 05/12/2008  . Esophageal stricture   . GERD (gastroesophageal reflux disease)   . 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 Gynecomastia   . Hepatic steatosis   . HYPERLIPIDEMIA 05/20/2007  . Hypertension   . Hypogonadism male   . Morbid obesity ([place]) 09/10/2009  . OBSTRUCTIVE SLEEP APNEA 11/04/2007  . Personal history of colonic polyps   . RENAL INSUFFICIENCY 05/12/2008  . Sleep apnea    uses cpap  . Urolithiasis     Past Surgical History:  Procedure Laterality Date  . Abdominal US  09/1997  . arm fracture Left 1958   with hardware  . Colon cancer screening    . COLONOSCOPY  08/18/2004   diverticulitis  . COLONOSCOPY  09/24/2009  . ELECTROCARDIOGRAM  02/2006  . FLEXIBLE SIGMOIDOSCOPY  03/04/2001   polyps, anal fissure  . GANGLION CYST EXCISION Left 1980  . Lower Arterial  04/13/2004  . POLYPECTOMY    . Rest Cardiolite  03/19/2003    There were no vitals filed for this visit.  Subjective Assessment - 05/13/19 1330     Subjective  I hurt all over.  I think if i had a cane I may have gotten up from my knees.    Currently in Pain?  Yes        [facility] Adult PT Treatment/Exercise - 05/13/19 0001      Standardized Balance Assessment   Standardized Balance Assessment  Berg Balance Test;Dynamic Gait Index;Timed Up and Go Test      Berg Balance Test   Sit to Stand  Able to stand without using hands and stabilize independently    Standing Unsupported  Able to stand safely 2 minutes    Sitting with Back Unsupported but Feet Supported on Floor or Stool  Able to sit safely and securely 2 minutes    Stand to Sit  Sits safely with minimal use of hands    Transfers  Able to transfer safely, minor use of hands    Standing Unsupported with Eyes Closed  Able to stand 10 seconds safely    Standing Ubsupported with Feet Together  Able to place feet together independently and stand for 1 minute with supervision    From Standing, Reach Forward with Outstretched Arm  Can reach confidently >25 cm (10")    From Standing Position, Pick  up Object from [place] to pick up shoe safely and easily    From Standing Position, Turn to Look Behind Over each Shoulder  Looks behind from both sides and weight shifts well    Turn 360 Degrees  Able to turn 360 degrees safely one side only in 4 seconds or less    Standing Unsupported, Alternately Place Feet on Step/Stool  Able to stand independently and complete 8 steps >20 seconds    Standing Unsupported, One Foot in [place] to plae foot ahead of the other independently and hold 30 seconds    Standing on One Leg  Tries to lift leg/unable to hold 3 seconds but remains standing independently    Total Score  49      Dynamic Gait Index   Level Surface  Normal    Change in Gait Speed  Normal    Gait with Horizontal Head Turns  Mild Impairment    Gait with Vertical Head Turns  Mild Impairment    Gait and Pivot Turn  Normal    Step Over Obstacle  Mild Impairment    Step Around  Obstacles  Normal    Steps  Normal    Total Score  21      Lumbar Exercises: Stretches   Active Hamstring Stretch  2 reps;30 seconds    Piriformis Stretch  3 reps;30 seconds      Lumbar Exercises: Aerobic   Elliptical  NuStep L 5 Ue and LE for 6 min       Lumbar Exercises: Seated   Sit to Stand  5 reps   19 sec      Lumbar Exercises: Sidelying   Clam  Left;15 reps             PT Education - 05/13/19 1419    Education provided  Yes    Education Details  balance and scores, HEP    Person(s) Educated  Patient    Methods  Explanation    Comprehension  Verbalized understanding       PT Short Term Goals - 05/05/19 1843      PT SHORT TERM GOAL #1   Title  independent with initial HEP    Time  4    Period  Weeks    Status  New    Target Date  06/02/19      PT SHORT TERM GOAL #2   Title  Pt will be screened for balance and goal set related to score.    Time  2    Period  Weeks    Status  New    Target Date  05/19/19      PT SHORT TERM GOAL #3   Title  Pt will be able to show proper lifting and body mechanics during session to preserve low back    Time  4    Period  Weeks    Status  New    Target Date  06/02/19        PT Long Term Goals - 05/05/19 2012      PT LONG TERM GOAL #1   Title  Pt will be able to stand for 15 min to do the dishes, home tasks with pain in back < 3/10 most of the time.    Time  8    Period  Weeks    Status  New    Target Date  06/30/19      PT LONG TERM GOAL #2  Title  Pt will be able to walk to and from his mailbox with pain in LEs and back <3/10    Time  8    Period  Weeks    Status  New    Target Date  06/30/19      PT LONG TERM GOAL #3   Title  Pt will be able to improve dynamic balance TBA based on initial test.    Time  8    Period  Weeks    Status  New    Target Date  06/30/19      PT LONG TERM GOAL #4   Title  Pt will score no more than 45% limited on FOTO to demo improved functional mobility.    Time  8     Period  Weeks    Status  New    Target Date  06/30/19      PT LONG TERM GOAL #5   Title  Pt will be I with HEP for posture, hip, trunk and balance    Time  8    Period  Weeks    Status  New    Target Date  06/30/19      Additional Long Term Goals   Additional Long Term Goals  Yes      PT LONG TERM GOAL #6   Title  Pt will get up from the floor independently without external support in case of falls    Time  8    Period  Weeks    Status  New    Target Date  06/30/19            Plan - 05/13/19 1401    Clinical Impression Statement  Patient with mild impairment in Berg balance test and Dynamic Gait index , scoring 49/56 and 21/24 respectively.  Discussed possible reasons for losing his balance, mostly when he steps down off a step.  Considered neuropathy as a reason as well as decreased quad control. Will likley benefit and improve his balance scores with LE strengthening and balance training.    PT Treatment/Interventions  ADLs/Self Care Home Management;Electrical Stimulation;Moist Heat;Traction;Ultrasound;Patient/family education;Therapeutic exercise;Therapeutic activities;Manual techniques;Gait training;Balance training;Dry needling;Functional mobility training    PT Next Visit Plan  LE strength    PT Home Exercise Plan  clam, hamstring and piriformis stretch       Patient will benefit from skilled therapeutic intervention in order to improve the following deficits and impairments:  Decreased activity tolerance, Decreased mobility, Decreased strength, Decreased range of motion, Difficulty walking, Impaired flexibility, Increased muscle spasms, Improper body mechanics, Pain, Postural dysfunction, Obesity, Decreased balance, Abnormal gait  Visit Diagnosis: 1. Repeated falls   2. 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 sciatica   3. Muscle weakness (generalized)        Problem List Patient Active Problem List   Diagnosis Date Noted  . Lung nodules 05/08/2019  . Sleep  disturbance 05/01/2019  . Fall (on)(from) sidewalk curb, initial encounter 04/18/2019  . Vitamin D deficiency 04/18/2019  . 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 Excessive cerumen in left ear canal 10/29/2018  . Trigger finger, right ring finger 08/13/2018  . Chest wall muscle strain 07/30/2018  . Need for influenza vaccination 07/30/2018  . Grieving 06/25/2018  . ETD (Eustachian tube dysfunction), left 06/25/2018  . Unilateral primary osteoarthritis, right knee 11/17/2016  . Complex tear of lateral meniscus of right knee as current injury 10/26/2016  . Radiculitis, lumbosacral 06/30/2016  . Diabetes ([place]) 01/01/2016  .  Solitary pulmonary nodule 12/30/2015  . Urolithiasis 02/23/2015  . Hearing loss 11/25/2014  . Paresthesia 09/07/2014  . Routine general medical examination at a health care facility 07/07/2014  . Disturbance of skin sensation 12/26/2013  . UTI (urinary tract infection) 03/12/2013  . Screening for prostate cancer 02/14/2012  . Right knee pain 11/15/2011  . Encounter for long-term (current) use of other medications 01/17/2011  . Special screening examination for neoplasm of prostate 01/17/2011  . MYCOSIS FUNGOIDES 12/29/2009  . HEARING LOSS 09/30/2009  . ECZEMA 09/30/2009  . VITAMIN B12 DEFICIENCY 09/10/2009  . MORBID OBESITY 09/10/2009  . SYNCOPE 05/03/2009  . DIASTOLIC DYSFUNCTION 50/56/9794  . CARDIAC MURMUR, SYSTOLIC 80/16/5537  . Pancytopenia ([place]) 05/12/2008  . Disorder resulting from impaired renal function 05/12/2008  . EDEMA 05/12/2008  . Obstructive sleep apnea 11/04/2007  . BACK PAIN, LUMBAR 10/23/2007  . Dyslipidemia 05/20/2007  . 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 Essential hypertension 05/20/2007    Elexius Minar 05/13/2019, 2:24 PM  [facility] [address] [place], [place], [address] Phone: [phone]   Fax:  [phone]  Name: [name] MRN: [id number] Date of Birth: 1941-01-15  Raeford Razor, PT 05/13/19  2:24 PM Phone: [phone] Fax: [phone]

## 2019-05-15 ENCOUNTER — Encounter: Payer: Self-pay | Admitting: Physical Therapy

## 2019-05-15 ENCOUNTER — Other Ambulatory Visit: Payer: Self-pay

## 2019-05-15 ENCOUNTER — Ambulatory Visit: Payer: Medicare Other | Admitting: Physical Therapy

## 2019-05-15 DIAGNOSIS — G8929 Other chronic pain: Secondary | ICD-10-CM

## 2019-05-15 DIAGNOSIS — R296 Repeated falls: Secondary | ICD-10-CM | POA: Diagnosis not present

## 2019-05-15 DIAGNOSIS — M6281 Muscle weakness (generalized): Secondary | ICD-10-CM

## 2019-05-15 NOTE — Therapy (Signed)
[place], [place], [address] Phone: [phone]   Fax:  [phone]  Physical Therapy Treatment  Patient Details  Name: [name] MRN: [id number] Date of Birth: 1941/05/18 Referring Provider (PT): Dr. Posey Pronto    Encounter Date: 05/15/2019  PT End of Session - 05/15/19 1535    Visit Number  3    Number of Visits  16    Date for PT Re-Evaluation  06/30/19    PT Start Time  1519    PT Stop Time  1605    PT Time Calculation (min)  46 min    Activity Tolerance  Patient tolerated treatment well    Behavior During Therapy  [facility] for tasks assessed/performed       Past Medical History:  Diagnosis Date  . Allergy   . Anal fissure   . Anemia, unspecified   . Arthritis    Spinal OA  . BACK PAIN, LUMBAR 10/23/2007  . CARDIAC MURMUR, SYSTOLIC 10/26/9700  . DIABETES MELLITUS, TYPE I 05/20/2007  . DIASTOLIC DYSFUNCTION 6/37/8588  . 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[place])   . Duodenitis   . Edema 05/12/2008  . Esophageal stricture   . GERD (gastroesophageal reflux disease)   . 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 Gynecomastia   . Hepatic steatosis   . HYPERLIPIDEMIA 05/20/2007  . Hypertension   . Hypogonadism male   . Morbid obesity ([place]) 09/10/2009  . OBSTRUCTIVE SLEEP APNEA 11/04/2007  . Personal history of colonic polyps   . RENAL INSUFFICIENCY 05/12/2008  . Sleep apnea    uses cpap  . Urolithiasis     Past Surgical History:  Procedure Laterality Date  . Abdominal US  09/1997  . arm fracture Left 1958   with hardware  . Colon cancer screening    . COLONOSCOPY  08/18/2004   diverticulitis  . COLONOSCOPY  09/24/2009  . ELECTROCARDIOGRAM  02/2006  . FLEXIBLE SIGMOIDOSCOPY  03/04/2001   polyps, anal fissure  . GANGLION CYST EXCISION Left 1980  . Lower Arterial  04/13/2004  . POLYPECTOMY    . Rest Cardiolite  03/19/2003    There were no vitals filed for this visit.  Subjective Assessment - 05/15/19 1532     Subjective  My shoulders hurt more then anything    Currently in Pain?  Yes    Pain Score  3     Pain Location  Hip    Pain Orientation  Right    Pain Descriptors / Indicators  Aching    Pain Type  Chronic pain    Pain Onset  More than a month ago    Pain Frequency  Intermittent          [facility] Adult PT Treatment/Exercise - 05/15/19 0001      Lumbar Exercises: Stretches   Active Hamstring Stretch  3 reps    Active Hamstring Stretch Limitations  strap on toes     Piriformis Stretch  3 reps;30 seconds      Lumbar Exercises: Aerobic   Elliptical  NuStep L 5 Ue and LE for 6 min       Knee/Hip Exercises: Standing   Hip Abduction  Stengthening;Both;1 set;10 reps    Hip Extension  Stengthening;Both;1 set;10 reps    Lateral Step Up  Right;Left;1 set;20 reps;Hand Hold: 1;Step Height: 6"    Forward Step Up  Right;Left;1 set;20 reps;Hand Hold: 1;Step Height: 6"    Functional Squat  10 reps  Moist Heat Therapy   Number Minutes Moist Heat  10 Minutes    Moist Heat Location  Lumbar Spine        PT Short Term Goals - 05/05/19 1843      PT SHORT TERM GOAL #1   Title  independent with initial HEP    Time  4    Period  Weeks    Status  New    Target Date  06/02/19      PT SHORT TERM GOAL #2   Title  Pt will be screened for balance and goal set related to score.    Time  2    Period  Weeks    Status  New    Target Date  05/19/19      PT SHORT TERM GOAL #3   Title  Pt will be able to show proper lifting and body mechanics during session to preserve low back    Time  4    Period  Weeks    Status  New    Target Date  06/02/19        PT Long Term Goals - 05/05/19 2012      PT LONG TERM GOAL #1   Title  Pt will be able to stand for 15 min to do the dishes, home tasks with pain in back < 3/10 most of the time.    Time  8    Period  Weeks    Status  New    Target Date  06/30/19      PT LONG TERM GOAL #2   Title  Pt will be able to walk to and from his mailbox  with pain in LEs and back <3/10    Time  8    Period  Weeks    Status  New    Target Date  06/30/19      PT LONG TERM GOAL #3   Title  Pt will be able to improve dynamic balance TBA based on initial test.    Time  8    Period  Weeks    Status  New    Target Date  06/30/19      PT LONG TERM GOAL #4   Title  Pt will score no more than 45% limited on FOTO to demo improved functional mobility.    Time  8    Period  Weeks    Status  New    Target Date  06/30/19      PT LONG TERM GOAL #5   Title  Pt will be I with HEP for posture, hip, trunk and balance    Time  8    Period  Weeks    Status  New    Target Date  06/30/19      Additional Long Term Goals   Additional Long Term Goals  Yes      PT LONG TERM GOAL #6   Title  Pt will get up from the floor independently without external support in case of falls    Time  8    Period  Weeks    Status  New    Target Date  06/30/19            Plan - 05/15/19 1555    Clinical Impression Statement  Worked mostly in standing on hip strength, pain in back increased to 7/10.  Used lumbar MHP to ease pain.    PT Treatment/Interventions  ADLs/Self Care Home  Management;Electrical Stimulation;Moist Heat;Traction;Ultrasound;Patient/family education;Therapeutic exercise;Therapeutic activities;Manual techniques;Gait training;Balance training;Dry needling;Functional mobility training    PT Next Visit Plan  LE strength, standing as able    PT Home Exercise Plan  clam, hamstring and piriformis stretch       Patient will benefit from skilled therapeutic intervention in order to improve the following deficits and impairments:  Decreased activity tolerance, Decreased mobility, Decreased strength, Decreased range of motion, Difficulty walking, Impaired flexibility, Increased muscle spasms, Improper body mechanics, Pain, Postural dysfunction, Obesity, Decreased balance, Abnormal gait  Visit Diagnosis: 1. Repeated falls   2. Chronic bilateral low  back pain without sciatica   3. Muscle weakness (generalized)        Problem List Patient Active Problem List   Diagnosis Date Noted  . Lung nodules 05/08/2019  . Sleep disturbance 05/01/2019  . Fall (on)(from) sidewalk curb, initial encounter 04/18/2019  . Vitamin D deficiency 04/18/2019  . 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 Excessive cerumen in left ear canal 10/29/2018  . Trigger finger, right ring finger 08/13/2018  . Chest wall muscle strain 07/30/2018  . Need for influenza vaccination 07/30/2018  . Grieving 06/25/2018  . ETD (Eustachian tube dysfunction), left 06/25/2018  . Unilateral primary osteoarthritis, right knee 11/17/2016  . Complex tear of lateral meniscus of right knee as current injury 10/26/2016  . Radiculitis, lumbosacral 06/30/2016  . Diabetes ([place]) 01/01/2016  . Solitary pulmonary nodule 12/30/2015  . Urolithiasis 02/23/2015  . Hearing loss 11/25/2014  . Paresthesia 09/07/2014  . Routine general medical examination at a health care facility 07/07/2014  . Disturbance of skin sensation 12/26/2013  . UTI (urinary tract infection) 03/12/2013  . Screening for prostate cancer 02/14/2012  . Right knee pain 11/15/2011  . Encounter for long-term (current) use of other medications 01/17/2011  . Special screening examination for neoplasm of prostate 01/17/2011  . MYCOSIS FUNGOIDES 12/29/2009  . HEARING LOSS 09/30/2009  . ECZEMA 09/30/2009  . VITAMIN B12 DEFICIENCY 09/10/2009  . MORBID OBESITY 09/10/2009  . SYNCOPE 05/03/2009  . DIASTOLIC DYSFUNCTION 59/93/5701  . CARDIAC MURMUR, SYSTOLIC 77/93/9030  . Pancytopenia ([place]) 05/12/2008  . Disorder resulting from impaired renal function 05/12/2008  . EDEMA 05/12/2008  . Obstructive sleep apnea 11/04/2007  . BACK PAIN, LUMBAR 10/23/2007  . Dyslipidemia 05/20/2007  . 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 Essential hypertension 05/20/2007    PAA,JENNIFER 05/15/2019, 3:58 PM  [facility] [address] [place], [place], [address] Phone: [phone]   Fax:  [phone]  Name: [name] MRN: [id number] Date of Birth: 02-16-1941  Raeford Razor, PT 05/15/19 3:58 PM Phone: [phone] Fax: [phone]

## 2019-05-20 ENCOUNTER — Ambulatory Visit: Payer: Medicare Other | Admitting: Physical Therapy

## 2019-05-23 ENCOUNTER — Other Ambulatory Visit: Payer: Self-pay

## 2019-05-23 ENCOUNTER — Encounter: Payer: Self-pay | Admitting: Physical Therapy

## 2019-05-23 ENCOUNTER — Ambulatory Visit: Payer: Medicare Other | Admitting: Physical Therapy

## 2019-05-23 DIAGNOSIS — M6281 Muscle weakness (generalized): Secondary | ICD-10-CM

## 2019-05-23 DIAGNOSIS — G8929 Other chronic pain: Secondary | ICD-10-CM

## 2019-05-23 DIAGNOSIS — R296 Repeated falls: Secondary | ICD-10-CM

## 2019-05-23 DIAGNOSIS — M545 Low back pain, unspecified: Secondary | ICD-10-CM

## 2019-05-23 NOTE — Therapy (Signed)
[place], [place], [address] Phone: [phone]   Fax:  [phone]  Physical Therapy Treatment  Patient Details  Name: [name] MRN: [id number] Date of Birth: 1940/11/16 Referring Provider (PT): Dr. Posey Pronto    Encounter Date: 05/23/2019  PT End of Session - 05/23/19 1118    Visit Number  4    Number of Visits  16    Date for PT Re-Evaluation  06/30/19    PT Start Time  1115    PT Stop Time  1215    PT Time Calculation (min)  60 min       Past Medical History:  Diagnosis Date  . Allergy   . Anal fissure   . Anemia, unspecified   . Arthritis    Spinal OA  . BACK PAIN, LUMBAR 10/23/2007  . CARDIAC MURMUR, SYSTOLIC 03/31/6294  . DIABETES MELLITUS, TYPE I 05/20/2007  . DIASTOLIC DYSFUNCTION 2/84/1324  . 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[place])   . Duodenitis   . Edema 05/12/2008  . Esophageal stricture   . GERD (gastroesophageal reflux disease)   . 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 Gynecomastia   . Hepatic steatosis   . HYPERLIPIDEMIA 05/20/2007  . Hypertension   . Hypogonadism male   . Morbid obesity ([place]) 09/10/2009  . OBSTRUCTIVE SLEEP APNEA 11/04/2007  . Personal history of colonic polyps   . RENAL INSUFFICIENCY 05/12/2008  . Sleep apnea    uses cpap  . Urolithiasis     Past Surgical History:  Procedure Laterality Date  . Abdominal US  09/1997  . arm fracture Left 1958   with hardware  . Colon cancer screening    . COLONOSCOPY  08/18/2004   diverticulitis  . COLONOSCOPY  09/24/2009  . ELECTROCARDIOGRAM  02/2006  . FLEXIBLE SIGMOIDOSCOPY  03/04/2001   polyps, anal fissure  . GANGLION CYST EXCISION Left 1980  . Lower Arterial  04/13/2004  . POLYPECTOMY    . Rest Cardiolite  03/19/2003    There were no vitals filed for this visit.  Subjective Assessment - 05/23/19 1117    Subjective  Had a tooth pulled monday and did not come to PT tuesday. Neck and shoulders hurt.    Pain Score  2     Pain Location  Back   and hip   Pain Orientation  Right    Pain Descriptors / Indicators  Aching    Pain Type  Chronic pain    Aggravating Factors   standing    Pain Relieving Factors  sitting                       [facility] Adult PT Treatment/Exercise - 05/23/19 0001      Self-Care   Other Self-Care Comments   Tennis ball for self STW to right posterior hip in supine/hooklying       Lumbar Exercises: Stretches   Active Hamstring Stretch  3 reps    Active Hamstring Stretch Limitations  strap on toes , causes shoulder pain     Piriformis Stretch  3 reps;30 seconds    Gastroc Stretch Limitations  slant board       Lumbar Exercises: Aerobic   Elliptical  NuStep L 5  LE for 6 min       Lumbar Exercises: Seated   Sit to Stand  10 reps      Lumbar Exercises: Supine   Bent Knee Raise  10 reps  Bent Knee Raise Limitations  with abdominal draw in     Bridge  15 reps      Lumbar Exercises: Sidelying   Clam  Right;15 reps      Knee/Hip Exercises: Standing   Hip Abduction  Stengthening;Both;1 set;20 reps    Abduction Limitations  10 reps on left, increasing LBP      Hip Extension  Stengthening;Both;1 set;10 reps    Lateral Step Up  --    Forward Step Up  Right;Left;1 set;20 reps;Hand Hold: 1;Step Height: 6"    Functional Squat  10 reps      Moist Heat Therapy   Number Minutes Moist Heat  10 Minutes    Moist Heat Location  Lumbar Spine   also upper traps, seated      Manual Therapy   Manual therapy comments  massage roller to right posteriolateral hip , soft tissue work              PT Education - 05/23/19 1212    Education provided  Yes    Education Details  Tennis ball soft tissue work       PT Short Term Goals - 05/05/19 1843      PT SHORT TERM GOAL #1   Title  independent with initial HEP    Time  4    Period  Weeks    Status  New    Target Date  06/02/19      PT SHORT TERM GOAL #2   Title  Pt will be screened for balance and goal set  related to score.    Time  2    Period  Weeks    Status  New    Target Date  05/19/19      PT SHORT TERM GOAL #3   Title  Pt will be able to show proper lifting and body mechanics during session to preserve low back    Time  4    Period  Weeks    Status  New    Target Date  06/02/19        PT Long Term Goals - 05/05/19 2012      PT LONG TERM GOAL #1   Title  Pt will be able to stand for 15 min to do the dishes, home tasks with pain in back < 3/10 most of the time.    Time  8    Period  Weeks    Status  New    Target Date  06/30/19      PT LONG TERM GOAL #2   Title  Pt will be able to walk to and from his mailbox with pain in LEs and back <3/10    Time  8    Period  Weeks    Status  New    Target Date  06/30/19      PT LONG TERM GOAL #3   Title  Pt will be able to improve dynamic balance TBA based on initial test.    Time  8    Period  Weeks    Status  New    Target Date  06/30/19      PT LONG TERM GOAL #4   Title  Pt will score no more than 45% limited on FOTO to demo improved functional mobility.    Time  8    Period  Weeks    Status  New    Target Date  06/30/19  PT LONG TERM GOAL #5   Title  Pt will be I with HEP for posture, hip, trunk and balance    Time  8    Period  Weeks    Status  New    Target Date  06/30/19      Additional Long Term Goals   Additional Long Term Goals  Yes      PT LONG TERM GOAL #6   Title  Pt will get up from the floor independently without external support in case of falls    Time  8    Period  Weeks    Status  New    Target Date  06/30/19            Plan - 05/23/19 1134    Clinical Impression Statement  Pain up to 8/10 after 10 minutes in closed chain. Seated rest break required. Began soft tissue work to Right gluteals/piriformis. Pt given tennis ball for self soft tissue work. HMP applied at end of session.    PT Next Visit Plan  LE strength, standing as able, manual to right hip    PT Home Exercise Plan   clam, hamstring and piriformis stretch, tennis ball for STW       Patient will benefit from skilled therapeutic intervention in order to improve the following deficits and impairments:  Decreased activity tolerance, Decreased mobility, Decreased strength, Decreased range of motion, Difficulty walking, Impaired flexibility, Increased muscle spasms, Improper body mechanics, Pain, Postural dysfunction, Obesity, Decreased balance, Abnormal gait  Visit Diagnosis: 1. Repeated falls   2. 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 sciatica   3. Muscle weakness (generalized)        Problem List Patient Active Problem List   Diagnosis Date Noted  . Lung nodules 05/08/2019  . Sleep disturbance 05/01/2019  . Fall (on)(from) sidewalk curb, initial encounter 04/18/2019  . Vitamin D deficiency 04/18/2019  . 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 Excessive cerumen in left ear canal 10/29/2018  . Trigger finger, right ring finger 08/13/2018  . Chest wall muscle strain 07/30/2018  . Need for influenza vaccination 07/30/2018  . Grieving 06/25/2018  . ETD (Eustachian tube dysfunction), left 06/25/2018  . Unilateral primary osteoarthritis, right knee 11/17/2016  . Complex tear of lateral meniscus of right knee as current injury 10/26/2016  . Radiculitis, lumbosacral 06/30/2016  . Diabetes ([place]) 01/01/2016  . Solitary pulmonary nodule 12/30/2015  . Urolithiasis 02/23/2015  . Hearing loss 11/25/2014  . Paresthesia 09/07/2014  . Routine general medical examination at a health care facility 07/07/2014  . Disturbance of skin sensation 12/26/2013  . UTI (urinary tract infection) 03/12/2013  . Screening for prostate cancer 02/14/2012  . Right knee pain 11/15/2011  . Encounter for long-term (current) use of other medications 01/17/2011  . Special screening examination for neoplasm of prostate 01/17/2011  . MYCOSIS FUNGOIDES 12/29/2009  . HEARING LOSS 09/30/2009  . ECZEMA 09/30/2009  . VITAMIN B12 DEFICIENCY  09/10/2009  . MORBID OBESITY 09/10/2009  . SYNCOPE 05/03/2009  . DIASTOLIC DYSFUNCTION 67/67/2094  . CARDIAC MURMUR, SYSTOLIC 70/96/2836  . Pancytopenia ([place]) 05/12/2008  . Disorder resulting from impaired renal function 05/12/2008  . EDEMA 05/12/2008  . Obstructive sleep apnea 11/04/2007  . BACK PAIN, LUMBAR 10/23/2007  . Dyslipidemia 05/20/2007  . 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 Essential hypertension 05/20/2007    Dorene Ar, PTA 05/23/2019, 12:14 PM  [facility] [address] [place], [place], [address] Phone: [phone]   Fax:  [phone]  Name: Aarron Wierzbicki MRN: [id number] Date of Birth: 09/08/1941

## 2019-05-27 ENCOUNTER — Ambulatory Visit: Payer: Medicare Other | Attending: Physical Medicine & Rehabilitation | Admitting: Physical Therapy

## 2019-05-27 ENCOUNTER — Other Ambulatory Visit: Payer: Self-pay

## 2019-05-27 DIAGNOSIS — M6281 Muscle weakness (generalized): Secondary | ICD-10-CM | POA: Diagnosis present

## 2019-05-27 DIAGNOSIS — R296 Repeated falls: Secondary | ICD-10-CM | POA: Diagnosis present

## 2019-05-27 DIAGNOSIS — G8929 Other chronic pain: Secondary | ICD-10-CM | POA: Diagnosis present

## 2019-05-27 DIAGNOSIS — M545 Low back pain, unspecified: Secondary | ICD-10-CM

## 2019-05-27 NOTE — Therapy (Signed)
[place], [place], [address] Phone: [phone]   Fax:  [phone]  Physical Therapy Treatment  Patient Details  Name: [name] MRN: [id number] Date of Birth: July 07, 1941 Referring Provider (PT): Dr. Posey Pronto    Encounter Date: 05/27/2019  PT End of Session - 05/27/19 1455    Visit Number  5    Number of Visits  16    Date for PT Re-Evaluation  06/30/19    PT Start Time  2595    PT Stop Time  1446    PT Time Calculation (min)  44 min    Activity Tolerance  Patient tolerated treatment well    Behavior During Therapy  [facility] for tasks assessed/performed       Past Medical History:  Diagnosis Date  . Allergy   . Anal fissure   . Anemia, unspecified   . Arthritis    Spinal OA  . BACK PAIN, LUMBAR 10/23/2007  . CARDIAC MURMUR, SYSTOLIC 03/26/8755  . DIABETES MELLITUS, TYPE I 05/20/2007  . DIASTOLIC DYSFUNCTION 4/33/2951  . 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[place])   . Duodenitis   . Edema 05/12/2008  . Esophageal stricture   . GERD (gastroesophageal reflux disease)   . 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 Gynecomastia   . Hepatic steatosis   . HYPERLIPIDEMIA 05/20/2007  . Hypertension   . Hypogonadism male   . Morbid obesity ([place]) 09/10/2009  . OBSTRUCTIVE SLEEP APNEA 11/04/2007  . Personal history of colonic polyps   . RENAL INSUFFICIENCY 05/12/2008  . Sleep apnea    uses cpap  . Urolithiasis     Past Surgical History:  Procedure Laterality Date  . Abdominal US  09/1997  . arm fracture Left 1958   with hardware  . Colon cancer screening    . COLONOSCOPY  08/18/2004   diverticulitis  . COLONOSCOPY  09/24/2009  . ELECTROCARDIOGRAM  02/2006  . FLEXIBLE SIGMOIDOSCOPY  03/04/2001   polyps, anal fissure  . GANGLION CYST EXCISION Left 1980  . Lower Arterial  04/13/2004  . POLYPECTOMY    . Rest Cardiolite  03/19/2003    There were no vitals filed for this visit.  Subjective Assessment - 05/27/19 1431     Subjective  Just my neck and shoulders hurt.    Currently in Pain?  No/denies          [facility] Adult PT Treatment/Exercise - 05/27/19 0001      Lumbar Exercises: Aerobic   Elliptical  NuStep L 5  LE for 6 min       Lumbar Exercises: Standing   Row  Strengthening;Both;15 reps    Theraband Level (Row)  Level 4 (Blue)    Shoulder Extension  Strengthening;Both;15 reps    Theraband Level (Shoulder Extension)  Level 4 (Blue)    Other Standing Lumbar Exercises  chest press 6 lbs due to painful Rt shoulder       Lumbar Exercises: Seated   Sit to Stand  10 reps      Knee/Hip Exercises: Standing   Hip Abduction  Stengthening;Both;1 set;15 reps    Lateral Step Up  Right;Left;1 set;20 reps;Hand Hold: 1;Step Height: 6"    Functional Squat  10 reps             PT Education - 05/27/19 1455    Education provided  Yes    Education Details  standing HEP, bands    Person(s) Educated  Patient    Methods  Explanation    Comprehension  Verbalized understanding       PT Short Term Goals - 05/05/19 1843      PT SHORT TERM GOAL #1   Title  independent with initial HEP    Time  4    Period  Weeks    Status  New    Target Date  06/02/19      PT SHORT TERM GOAL #2   Title  Pt will be screened for balance and goal set related to score.    Time  2    Period  Weeks    Status  New    Target Date  05/19/19      PT SHORT TERM GOAL #3   Title  Pt will be able to show proper lifting and body mechanics during session to preserve low back    Time  4    Period  Weeks    Status  New    Target Date  06/02/19        PT Long Term Goals - 05/05/19 2012      PT LONG TERM GOAL #1   Title  Pt will be able to stand for 15 min to do the dishes, home tasks with pain in back < 3/10 most of the time.    Time  8    Period  Weeks    Status  New    Target Date  06/30/19      PT LONG TERM GOAL #2   Title  Pt will be able to walk to and from his mailbox with pain in LEs and back <3/10    Time   8    Period  Weeks    Status  New    Target Date  06/30/19      PT LONG TERM GOAL #3   Title  Pt will be able to improve dynamic balance TBA based on initial test.    Time  8    Period  Weeks    Status  New    Target Date  06/30/19      PT LONG TERM GOAL #4   Title  Pt will score no more than 45% limited on FOTO to demo improved functional mobility.    Time  8    Period  Weeks    Status  New    Target Date  06/30/19      PT LONG TERM GOAL #5   Title  Pt will be I with HEP for posture, hip, trunk and balance    Time  8    Period  Weeks    Status  New    Target Date  06/30/19      Additional Long Term Goals   Additional Long Term Goals  Yes      PT LONG TERM GOAL #6   Title  Pt will get up from the floor independently without external support in case of falls    Time  8    Period  Weeks    Status  New    Target Date  06/30/19            Plan - 05/27/19 1456    Clinical Impression Statement  Patient did not complain of pain during session with intermittent standing.  Dyspnea with sit to stand and standing exercises.  Added band exercises for posterior chain strengthening.    PT Treatment/Interventions  ADLs/Self Care Home Management;Electrical Stimulation;Moist Heat;Traction;Ultrasound;Patient/family education;Therapeutic exercise;Therapeutic activities;Manual  techniques;Gait training;Balance training;Dry needling;Functional mobility training    PT Next Visit Plan  LE strength, standing as able, manual to right hip    PT Home Exercise Plan  clam, hamstring and piriformis stretch, tennis ball for STW       Patient will benefit from skilled therapeutic intervention in order to improve the following deficits and impairments:  Decreased activity tolerance, Decreased mobility, Decreased strength, Decreased range of motion, Difficulty walking, Impaired flexibility, Increased muscle spasms, Improper body mechanics, Pain, Postural dysfunction, Obesity, Decreased balance,  Abnormal gait  Visit Diagnosis: 1. Repeated falls   2. 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 sciatica   3. Muscle weakness (generalized)        Problem List Patient Active Problem List   Diagnosis Date Noted  . Lung nodules 05/08/2019  . Sleep disturbance 05/01/2019  . Fall (on)(from) sidewalk curb, initial encounter 04/18/2019  . Vitamin D deficiency 04/18/2019  . 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 Excessive cerumen in left ear canal 10/29/2018  . Trigger finger, right ring finger 08/13/2018  . Chest wall muscle strain 07/30/2018  . Need for influenza vaccination 07/30/2018  . Grieving 06/25/2018  . ETD (Eustachian tube dysfunction), left 06/25/2018  . Unilateral primary osteoarthritis, right knee 11/17/2016  . Complex tear of lateral meniscus of right knee as current injury 10/26/2016  . Radiculitis, lumbosacral 06/30/2016  . Diabetes ([place]) 01/01/2016  . Solitary pulmonary nodule 12/30/2015  . Urolithiasis 02/23/2015  . Hearing loss 11/25/2014  . Paresthesia 09/07/2014  . Routine general medical examination at a health care facility 07/07/2014  . Disturbance of skin sensation 12/26/2013  . UTI (urinary tract infection) 03/12/2013  . Screening for prostate cancer 02/14/2012  . Right knee pain 11/15/2011  . Encounter for long-term (current) use of other medications 01/17/2011  . Special screening examination for neoplasm of prostate 01/17/2011  . MYCOSIS FUNGOIDES 12/29/2009  . HEARING LOSS 09/30/2009  . ECZEMA 09/30/2009  . VITAMIN B12 DEFICIENCY 09/10/2009  . MORBID OBESITY 09/10/2009  . SYNCOPE 05/03/2009  . DIASTOLIC DYSFUNCTION 86/75/4492  . CARDIAC MURMUR, SYSTOLIC 01/00/7121  . Pancytopenia ([place]) 05/12/2008  . Disorder resulting from impaired renal function 05/12/2008  . EDEMA 05/12/2008  . Obstructive sleep apnea 11/04/2007  . BACK PAIN, LUMBAR 10/23/2007  . Dyslipidemia 05/20/2007  . 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 Essential hypertension 05/20/2007     Teon Hudnall 05/27/2019, 3:02 PM  [facility] [address] [place], [place], [address] Phone: [phone]   Fax:  [phone]  Name: [name] MRN: [id number] Date of Birth: 03-04-1941  Raeford Razor, PT 05/27/19 3:02 PM Phone: [phone] Fax: [phone]

## 2019-05-27 NOTE — Patient Instructions (Signed)
Step 1  Step 2  Step Up reps: 10  sets: 2  daily: 1  weekly: 7 Setup  Begin standing tall on a step. Movement  Slowly step off and lower one leg behind the step, lightly touching your foot to the ground. Straighten your leg on the step, returning to the starting position and repeat. Tip  Make sure to keep your knee in line with your foot and do not let your pelvis tilt or drop. Step 1  Step 2  Supine Bridge reps: 10  sets: 2  hold: 5  daily: 1  weekly: 7 Setup  Begin lying on your back with your arms resting at your sides, your legs bent at the knees and your feet flat on the ground. Movement  Tighten your abdominals and slowly lift your hips off the floor into a bridge position, keeping your back straight. Tip  Make sure to keep your trunk stiff throughout the exercise and your arms flat on the floor. Step 1  Step 2  Standing Bilateral Low Shoulder Row with Anchored Resistance reps: 10  sets: 2  hold: 5  daily: 1  weekly: 7 Setup  Begin in a standing upright position holding both ends of a resistance band that is anchored in front of you, with your palms facing up. Movement  Pull your arms back against the resistance band, bending at your elbows, then return to the starting position and repeat. Tip  Make sure to keep your back straight and think of squeezing your shoulder blades together as you pull your arms back. Step 1  Step 2  Shoulder Extension with Resistance reps: 10  sets: 2  hold: 5  daily: 1  weekly: 7 Setup  Begin in a standing position holding both ends of a resistance band anchored in front of you with your arms straight in front of your body. Movement  Keeping your elbows straight, pull your hands down toward your hips. You should feel your shoulder blades go down. Return to start and repeat.  Tip  Make sure to maintain good posture during the exercise and do not shrug your shoulders.

## 2019-05-29 ENCOUNTER — Ambulatory Visit: Payer: Medicare Other | Admitting: Physical Therapy

## 2019-05-29 ENCOUNTER — Encounter: Payer: Self-pay | Admitting: Physical Therapy

## 2019-05-29 ENCOUNTER — Other Ambulatory Visit: Payer: Self-pay

## 2019-05-29 DIAGNOSIS — G8929 Other chronic pain: Secondary | ICD-10-CM

## 2019-05-29 DIAGNOSIS — R296 Repeated falls: Secondary | ICD-10-CM | POA: Diagnosis not present

## 2019-05-29 DIAGNOSIS — M545 Low back pain, unspecified: Secondary | ICD-10-CM

## 2019-05-29 DIAGNOSIS — M6281 Muscle weakness (generalized): Secondary | ICD-10-CM

## 2019-05-29 NOTE — Therapy (Signed)
[name], [place], [address] Phone: [phone]   Fax:  [phone]  Physical Therapy Treatment  Patient Details  Name: [name] MRN: [id number] Date of Birth: 03-05-1941 Referring Provider (PT): Dr. Posey Pronto    Encounter Date: 05/29/2019  PT End of Session - 05/29/19 1349    Visit Number  6    Number of Visits  16    Date for PT Re-Evaluation  06/30/19    PT Start Time  1350    PT Stop Time  1440    PT Time Calculation (min)  50 min    Activity Tolerance  Patient tolerated treatment well    Behavior During Therapy  [facility] for tasks assessed/performed       Past Medical History:  Diagnosis Date  . Allergy   . Anal fissure   . Anemia, unspecified   . Arthritis    Spinal OA  . BACK PAIN, LUMBAR 10/23/2007  . CARDIAC MURMUR, SYSTOLIC 12/01/9369  . DIABETES MELLITUS, TYPE I 05/20/2007  . DIASTOLIC DYSFUNCTION 6/96/7893  . 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[place])   . Duodenitis   . Edema 05/12/2008  . Esophageal stricture   . GERD (gastroesophageal reflux disease)   . 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 Gynecomastia   . Hepatic steatosis   . HYPERLIPIDEMIA 05/20/2007  . Hypertension   . Hypogonadism male   . Morbid obesity ([place]) 09/10/2009  . OBSTRUCTIVE SLEEP APNEA 11/04/2007  . Personal history of colonic polyps   . RENAL INSUFFICIENCY 05/12/2008  . Sleep apnea    uses cpap  . Urolithiasis     Past Surgical History:  Procedure Laterality Date  . Abdominal US  09/1997  . arm fracture Left 1958   with hardware  . Colon cancer screening    . COLONOSCOPY  08/18/2004   diverticulitis  . COLONOSCOPY  09/24/2009  . ELECTROCARDIOGRAM  02/2006  . FLEXIBLE SIGMOIDOSCOPY  03/04/2001   polyps, anal fissure  . GANGLION CYST EXCISION Left 1980  . Lower Arterial  04/13/2004  . POLYPECTOMY    . Rest Cardiolite  03/19/2003    There were no vitals filed for this visit.  Subjective Assessment - 05/29/19 1355    Subjective  I feel bad today.  My back and shoulder hurt today, really bad when I got out of the shower.    Currently in Pain?  Yes    Pain Score  7     Pain Location  Shoulder    Pain Orientation  Right;Other (Comment)   superior   Pain Descriptors / Indicators  Aching    Pain Type  Chronic pain    Pain Onset  More than a month ago    Pain Frequency  Constant    Aggravating Factors   mostly with reaching out    Pain Relieving Factors  rest         [facility] Adult PT Treatment/Exercise - 05/29/19 0001      Lumbar Exercises: Stretches   Active Hamstring Stretch  1 rep    Passive Hamstring Stretch  3 reps    Single Knee to Chest Stretch  3 reps    Lower Trunk Rotation  10 seconds    Lower Trunk Rotation Limitations  x 10     Piriformis Stretch  2 reps;30 seconds      Lumbar Exercises: Aerobic   Elliptical  NuStep L8 LE only for 5 min  Lumbar Exercises: Machines for Strengthening   Leg Press  2 plates 3 x 10 reps       Lumbar Exercises: Supine   Clam  10 reps    Clam Limitations  blue band     Bent Knee Raise  10 reps    Bent Knee Raise Limitations  blue  band     Bridge  10 reps    Bridge with clamshell  10 reps      Knee/Hip Exercises: Standing   Forward Step Up  Right;Left;1 set;20 reps;Hand Hold: 1;Step Height: 8"      Manual Therapy   Manual therapy comments  massage roller to right posteriolateral hip , soft tissue work              PT Education - 05/29/19 1407    Education provided  Yes    Education Details  referral for shoulder pain ?    Person(s) Educated  Patient    Methods  Explanation    Comprehension  Verbalized understanding       PT Short Term Goals - 05/29/19 1424      PT SHORT TERM GOAL #1   Title  independent with initial HEP        PT Long Term Goals - 05/05/19 2012      PT LONG TERM GOAL #1   Title  Pt will be able to stand for 15 min to do the dishes, home tasks with pain in back < 3/10 most of the time.    Time  8     Period  Weeks    Status  New    Target Date  06/30/19      PT LONG TERM GOAL #2   Title  Pt will be able to walk to and from his mailbox with pain in LEs and back <3/10    Time  8    Period  Weeks    Status  New    Target Date  06/30/19      PT LONG TERM GOAL #3   Title  Pt will be able to improve dynamic balance TBA based on initial test.    Time  8    Period  Weeks    Status  New    Target Date  06/30/19      PT LONG TERM GOAL #4   Title  Pt will score no more than 45% limited on FOTO to demo improved functional mobility.    Time  8    Period  Weeks    Status  New    Target Date  06/30/19      PT LONG TERM GOAL #5   Title  Pt will be I with HEP for posture, hip, trunk and balance    Time  8    Period  Weeks    Status  New    Target Date  06/30/19      Additional Long Term Goals   Additional Long Term Goals  Yes      PT LONG TERM GOAL #6   Title  Pt will get up from the floor independently without external support in case of falls    Time  8    Period  Weeks    Status  New    Target Date  06/30/19          Check goals!  Plan - 05/29/19 1350    Clinical Impression Statement  Patient tolerated exercises well despite "  all over" body aches and pain. He may consider seeing Dr. Erlinda Hong for his shoulder.  He has no back pain with exercises but self limits his walking due to pain.    PT Treatment/Interventions  ADLs/Self Care Home Management;Electrical Stimulation;Moist Heat;Traction;Ultrasound;Patient/family education;Therapeutic exercise;Therapeutic activities;Manual techniques;Gait training;Balance training;Dry needling;Functional mobility training    PT Next Visit Plan  LE strength, standing as able, manual to right hip. check UE ex with bands, standing    PT Home Exercise Plan  clam, hamstring and piriformis stretch, tennis ball for STW    Consulted and Agree with Plan of Care  Patient       Patient will benefit from skilled therapeutic intervention in order to  improve the following deficits and impairments:  Decreased activity tolerance, Decreased mobility, Decreased strength, Decreased range of motion, Difficulty walking, Impaired flexibility, Increased muscle spasms, Improper body mechanics, Pain, Postural dysfunction, Obesity, Decreased balance, Abnormal gait  Visit Diagnosis: 1. Repeated falls   2. 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 sciatica   3. Muscle weakness (generalized)        Problem List Patient Active Problem List   Diagnosis Date Noted  . Lung nodules 05/08/2019  . Sleep disturbance 05/01/2019  . Fall (on)(from) sidewalk curb, initial encounter 04/18/2019  . Vitamin D deficiency 04/18/2019  . 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 Excessive cerumen in left ear canal 10/29/2018  . Trigger finger, right ring finger 08/13/2018  . Chest wall muscle strain 07/30/2018  . Need for influenza vaccination 07/30/2018  . Grieving 06/25/2018  . ETD (Eustachian tube dysfunction), left 06/25/2018  . Unilateral primary osteoarthritis, right knee 11/17/2016  . Complex tear of lateral meniscus of right knee as current injury 10/26/2016  . Radiculitis, lumbosacral 06/30/2016  . Diabetes ([place]) 01/01/2016  . Solitary pulmonary nodule 12/30/2015  . Urolithiasis 02/23/2015  . Hearing loss 11/25/2014  . Paresthesia 09/07/2014  . Routine general medical examination at a health care facility 07/07/2014  . Disturbance of skin sensation 12/26/2013  . UTI (urinary tract infection) 03/12/2013  . Screening for prostate cancer 02/14/2012  . Right knee pain 11/15/2011  . Encounter for long-term (current) use of other medications 01/17/2011  . Special screening examination for neoplasm of prostate 01/17/2011  . MYCOSIS FUNGOIDES 12/29/2009  . HEARING LOSS 09/30/2009  . ECZEMA 09/30/2009  . VITAMIN B12 DEFICIENCY 09/10/2009  . MORBID OBESITY 09/10/2009  . SYNCOPE 05/03/2009  . DIASTOLIC DYSFUNCTION 67/09/4579  . CARDIAC MURMUR, SYSTOLIC 99/83/3825  .  Pancytopenia ([place]) 05/12/2008  . Disorder resulting from impaired renal function 05/12/2008  . EDEMA 05/12/2008  . Obstructive sleep apnea 11/04/2007  . BACK PAIN, LUMBAR 10/23/2007  . Dyslipidemia 05/20/2007  . 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 Essential hypertension 05/20/2007    Arpita Fentress 05/29/2019, 2:52 PM  [facility] [address] [place], [place], [address] Phone: [phone]   Fax:  [phone]  Name: [place] MRN: [id number] Date of Birth: February 09, 1941  Raeford Razor, PT 05/29/19 2:52 PM Phone: [phone] Fax: [phone]

## 2019-06-03 ENCOUNTER — Ambulatory Visit: Payer: Medicare Other | Admitting: Physical Therapy

## 2019-06-03 ENCOUNTER — Other Ambulatory Visit: Payer: Self-pay

## 2019-06-03 DIAGNOSIS — G8929 Other chronic pain: Secondary | ICD-10-CM

## 2019-06-03 DIAGNOSIS — M545 Low back pain, unspecified: Secondary | ICD-10-CM

## 2019-06-03 DIAGNOSIS — M6281 Muscle weakness (generalized): Secondary | ICD-10-CM

## 2019-06-03 DIAGNOSIS — R296 Repeated falls: Secondary | ICD-10-CM | POA: Diagnosis not present

## 2019-06-03 NOTE — Therapy (Signed)
[place], [place], [address] Phone: [phone]   Fax:  [phone]  Physical Therapy Treatment  Patient Details  Name: [name] MRN: [id number] Date of Birth: Oct 05, 1941 Referring Provider (PT): Dr. Posey Pronto    Encounter Date: 06/03/2019  PT End of Session - 06/03/19 1436    Visit Number  7    Number of Visits  16    Date for PT Re-Evaluation  06/30/19    PT Start Time  1400    PT Stop Time  1442    PT Time Calculation (min)  42 min    Activity Tolerance  Patient tolerated treatment well    Behavior During Therapy  [facility] for tasks assessed/performed       Past Medical History:  Diagnosis Date  . Allergy   . Anal fissure   . Anemia, unspecified   . Arthritis    Spinal OA  . BACK PAIN, LUMBAR 10/23/2007  . CARDIAC MURMUR, SYSTOLIC 10/30/4079  . DIABETES MELLITUS, TYPE I 05/20/2007  . DIASTOLIC DYSFUNCTION 4/48/1856  . 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[place])   . Duodenitis   . Edema 05/12/2008  . Esophageal stricture   . GERD (gastroesophageal reflux disease)   . 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 Gynecomastia   . Hepatic steatosis   . HYPERLIPIDEMIA 05/20/2007  . Hypertension   . Hypogonadism male   . Morbid obesity ([place]) 09/10/2009  . OBSTRUCTIVE SLEEP APNEA 11/04/2007  . Personal history of colonic polyps   . RENAL INSUFFICIENCY 05/12/2008  . Sleep apnea    uses cpap  . Urolithiasis     Past Surgical History:  Procedure Laterality Date  . Abdominal US  09/1997  . arm fracture Left 1958   with hardware  . Colon cancer screening    . COLONOSCOPY  08/18/2004   diverticulitis  . COLONOSCOPY  09/24/2009  . ELECTROCARDIOGRAM  02/2006  . FLEXIBLE SIGMOIDOSCOPY  03/04/2001   polyps, anal fissure  . GANGLION CYST EXCISION Left 1980  . Lower Arterial  04/13/2004  . POLYPECTOMY    . Rest Cardiolite  03/19/2003    There were no vitals filed for this visit.  Subjective Assessment - 06/03/19 1403     Subjective  HAsnt called Dr. Erlinda Hong yet, also has a physical next month.  Shoulder "hurts like hell". Encouraged him to call. He has no back pain right now.    Currently in Pain?  No/denies    Pain Location  Back    Multiple Pain Sites  Yes    Pain Score  9    Pain Location  Shoulder    Pain Orientation  Right    Pain Descriptors / Indicators  Aching;Sharp;Shooting    Pain Type  Chronic pain    Pain Onset  More than a month ago    Pain Frequency  Intermittent    Aggravating Factors   using it    Pain Relieving Factors  resting it          [facility] Adult PT Treatment/Exercise - 06/03/19 0001      Lumbar Exercises: Stretches   Active Hamstring Stretch  3 reps    Single Knee to Chest Stretch  3 reps    Lower Trunk Rotation  10 seconds    Lower Trunk Rotation Limitations  x 10       Lumbar Exercises: Standing   Row  Strengthening;Both;10 reps    Theraband Level (Row)  Level 4 (Blue)  Row Limitations  x 2     Shoulder Extension  Strengthening;Both;10 reps    Theraband Level (Shoulder Extension)  Level 4 (Blue)    Shoulder Extension Limitations  x 2       Knee/Hip Exercises: Standing   Side Lunges  Both;1 set;10 reps    Hip Abduction  Stengthening;Both;1 set;15 reps    Lateral Step Up  Right;Left;1 set;20 reps;Hand Hold: 1;Step Height: 6"    Wall Squat  1 set;10 reps    SLS  semi circles with 1 UE support                PT Short Term Goals - 06/03/19 1437      PT SHORT TERM GOAL #1   Title  independent with initial HEP    Status  Achieved      PT SHORT TERM GOAL #2   Title  Pt will be screened for balance and goal set related to score.    Status  Achieved      PT SHORT TERM GOAL #3   Title  Pt will be able to show proper lifting and body mechanics during session to preserve low back    Status  Achieved        PT Long Term Goals - 06/03/19 1437      PT LONG TERM GOAL #1   Title  Pt will be able to stand for 15 min to do the dishes, home tasks with pain in  back < 3/10 most of the time.    Status  On-going      PT LONG TERM GOAL #2   Title  Pt will be able to walk to and from his mailbox with pain in LEs and back <3/10    Status  On-going      PT LONG TERM GOAL #3   Title  Pt will be able to improve dynamic balance TBA based on initial test.    Baseline  not needed      PT LONG TERM GOAL #4   Title  Pt will score no more than 45% limited on FOTO to demo improved functional mobility.    Status  Unable to assess      PT LONG TERM GOAL #5   Title  Pt will be I with HEP for posture, hip, trunk and balance    Status  On-going      PT LONG TERM GOAL #6   Title  Pt will get up from the floor independently without external support in case of falls    Baseline  unable , done at home and declined today.    Status  Deferred            Plan - 06/03/19 1439    Clinical Impression Statement  Patient has not noticed any functional changes with PT, feels stronger but cannot identify what activites are impacted.  Cont to need a rest break with walking 1/4 mile.  Sits 10 sec and pain is relieved. He may finish POC next visit.    PT Treatment/Interventions  ADLs/Self Care Home Management;Electrical Stimulation;Moist Heat;Traction;Ultrasound;Patient/family education;Therapeutic exercise;Therapeutic activities;Manual techniques;Gait training;Balance training;Dry needling;Functional mobility training    PT Next Visit Plan  LE strength, standing as able, manual to right hip. check UE ex with bands, standing.  DC vs renew    PT Home Exercise Plan  clam, hamstring and piriformis stretch, tennis ball for STW    Consulted and Agree with Plan of Care  Patient  Patient will benefit from skilled therapeutic intervention in order to improve the following deficits and impairments:  Decreased activity tolerance, Decreased mobility, Decreased strength, Decreased range of motion, Difficulty walking, Impaired flexibility, Increased muscle spasms, Improper body  mechanics, Pain, Postural dysfunction, Obesity, Decreased balance, Abnormal gait  Visit Diagnosis: 1. Repeated falls   2. 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 sciatica   3. Muscle weakness (generalized)        Problem List Patient Active Problem List   Diagnosis Date Noted  . Lung nodules 05/08/2019  . Sleep disturbance 05/01/2019  . Fall (on)(from) sidewalk curb, initial encounter 04/18/2019  . Vitamin D deficiency 04/18/2019  . 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 Excessive cerumen in left ear canal 10/29/2018  . Trigger finger, right ring finger 08/13/2018  . Chest wall muscle strain 07/30/2018  . Need for influenza vaccination 07/30/2018  . Grieving 06/25/2018  . ETD (Eustachian tube dysfunction), left 06/25/2018  . Unilateral primary osteoarthritis, right knee 11/17/2016  . Complex tear of lateral meniscus of right knee as current injury 10/26/2016  . Radiculitis, lumbosacral 06/30/2016  . Diabetes ([place]) 01/01/2016  . Solitary pulmonary nodule 12/30/2015  . Urolithiasis 02/23/2015  . Hearing loss 11/25/2014  . Paresthesia 09/07/2014  . Routine general medical examination at a health care facility 07/07/2014  . Disturbance of skin sensation 12/26/2013  . UTI (urinary tract infection) 03/12/2013  . Screening for prostate cancer 02/14/2012  . Right knee pain 11/15/2011  . Encounter for long-term (current) use of other medications 01/17/2011  . Special screening examination for neoplasm of prostate 01/17/2011  . MYCOSIS FUNGOIDES 12/29/2009  . HEARING LOSS 09/30/2009  . ECZEMA 09/30/2009  . VITAMIN B12 DEFICIENCY 09/10/2009  . MORBID OBESITY 09/10/2009  . SYNCOPE 05/03/2009  . DIASTOLIC DYSFUNCTION 69/62/9528  . CARDIAC MURMUR, SYSTOLIC 41/32/4401  . Pancytopenia ([place]) 05/12/2008  . Disorder resulting from impaired renal function 05/12/2008  . EDEMA 05/12/2008  . Obstructive sleep apnea 11/04/2007  . BACK PAIN, LUMBAR 10/23/2007  . Dyslipidemia 05/20/2007  . GOUT  05/20/2007  . Essential hypertension 05/20/2007    Jerrica Thorman 06/03/2019, 2:47 PM  [facility] [address] [place], [place], [address] Phone: [phone]   Fax:  [phone]  Name: [name] MRN: [id number] Date of Birth: 1941-08-21  Raeford Razor, PT 06/03/19 2:48 PM Phone: [phone] Fax: [phone]

## 2019-06-05 ENCOUNTER — Ambulatory Visit: Payer: Medicare Other | Admitting: Physical Therapy

## 2019-06-05 ENCOUNTER — Other Ambulatory Visit: Payer: Self-pay

## 2019-06-05 DIAGNOSIS — M6281 Muscle weakness (generalized): Secondary | ICD-10-CM

## 2019-06-05 DIAGNOSIS — M545 Low back pain, unspecified: Secondary | ICD-10-CM

## 2019-06-05 DIAGNOSIS — R296 Repeated falls: Secondary | ICD-10-CM | POA: Diagnosis not present

## 2019-06-05 DIAGNOSIS — G8929 Other chronic pain: Secondary | ICD-10-CM

## 2019-06-05 NOTE — Therapy (Signed)
[place], [place], [address] Phone: [phone]   Fax:  [phone]  Physical Therapy Treatment/Discharge   Patient Details  Name: [name] MRN: [id number] Date of Birth: April 23, 1941 Referring Provider (PT): Dr. Posey Pronto    Encounter Date: 06/05/2019  PT End of Session - 06/05/19 1357    Visit Number  8    Number of Visits  16    Date for PT Re-Evaluation  06/30/19    PT Start Time  1021    PT Stop Time  1445    PT Time Calculation (min)  50 min    Activity Tolerance  Patient tolerated treatment well    Behavior During Therapy  [facility] for tasks assessed/performed       Past Medical History:  Diagnosis Date  . Allergy   . Anal fissure   . Anemia, unspecified   . Arthritis    Spinal OA  . BACK PAIN, LUMBAR 10/23/2007  . CARDIAC MURMUR, SYSTOLIC 10/23/7354  . DIABETES MELLITUS, TYPE I 05/20/2007  . DIASTOLIC DYSFUNCTION 04/22/4102  . 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 Duodenitis   . Edema 05/12/2008  . Esophageal stricture   . GERD (gastroesophageal reflux disease)   . 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 Gynecomastia   . Hepatic steatosis   . HYPERLIPIDEMIA 05/20/2007  . Hypertension   . Hypogonadism male   . Morbid obesity ([place]) 09/10/2009  . OBSTRUCTIVE SLEEP APNEA 11/04/2007  . Personal history of colonic polyps   . RENAL INSUFFICIENCY 05/12/2008  . Sleep apnea    uses cpap  . Urolithiasis     Past Surgical History:  Procedure Laterality Date  . Abdominal US  09/1997  . arm fracture Left 1958   with hardware  . Colon cancer screening    . COLONOSCOPY  08/18/2004   diverticulitis  . COLONOSCOPY  09/24/2009  . ELECTROCARDIOGRAM  02/2006  . FLEXIBLE SIGMOIDOSCOPY  03/04/2001   polyps, anal fissure  . GANGLION CYST EXCISION Left 1980  . Lower Arterial  04/13/2004  . POLYPECTOMY    . Rest Cardiolite  03/19/2003    There were no vitals filed for this visit.  Subjective Assessment -  06/05/19 1358    Subjective  Last day today for his LE, back.  Sees Dr. Erlinda Hong Tuesday.  Has a headache. Back hurt this AM, does not remember what it was.    Currently in Pain?  No/denies         [facility] PT Assessment - 06/05/19 0001      Observation/Other Assessments   Focus on Therapeutic Outcomes (FOTO)   48%      AROM   Lumbar Flexion  reach to distal shin with sl bend     Lumbar Extension  25%    Lumbar - Right Side Bend  50%    Lumbar - Left Side Bend  50%    Lumbar - Right Rotation  WFL    Lumbar - Left Rotation  [facility]       Strength   Right Hip Flexion  4+/5    Right Hip ABduction  4+/5    Left Hip Flexion  4+/5    Left Hip ABduction  4+/5    Right Knee Flexion  4+/5    Right Knee Extension  5/5    Left Knee Flexion  4+/5    Left Knee Extension  5/5          [facility] Adult  PT Treatment/Exercise - 06/05/19 0001      Lumbar Exercises: Stretches   Lower Trunk Rotation  10 seconds    Lower Trunk Rotation Limitations  x 10     Piriformis Stretch  2 reps;30 seconds    Piriformis Stretch Limitations  knees crossed with LTR       Lumbar Exercises: Aerobic   Nustep  7 min L5 LE only       Lumbar Exercises: Machines for Strengthening   Other Lumbar Machine Exercise  hip extension machine x 15 each side 3 plates cues for posture       Lumbar Exercises: Sidelying   Clam  Both;20 reps    Hip Abduction  Both;20 reps      Knee/Hip Exercises: Standing   Forward Step Up  Right;Left;1 set;15 reps    Forward Step Up Limitations  7 inches 1 UE assist               PT Short Term Goals - 06/03/19 1437      PT SHORT TERM GOAL #1   Title  independent with initial HEP    Status  Achieved      PT SHORT TERM GOAL #2   Title  Pt will be screened for balance and goal set related to score.    Status  Achieved      PT SHORT TERM GOAL #3   Title  Pt will be able to show proper lifting and body mechanics during session to preserve low back    Status  Achieved        PT  Long Term Goals - 06/05/19 1407      PT LONG TERM GOAL #1   Title  Pt will be able to stand for 15 min to do the dishes, home tasks with pain in back < 3/10 most of the time.    Baseline  sometimes, depends on the day.    Status  Partially Met      PT LONG TERM GOAL #2   Title  Pt will be able to walk to and from his mailbox with pain in LEs and back <3/10    Status  Achieved      PT LONG TERM GOAL #3   Title  Pt will be able to improve dynamic balance TBA based on initial test.    Status  Deferred      PT LONG TERM GOAL #4   Title  Pt will score no more than 45% limited on FOTO to demo improved functional mobility.    Baseline  48%, improved 7%    Status  Partially Met      PT LONG TERM GOAL #5   Title  Pt will be I with HEP for posture, hip, trunk and balance    Status  Achieved      PT LONG TERM GOAL #6   Title  Pt will get up from the floor independently without external support in case of falls    Baseline  unable , done at home and declined today.    Status  Deferred            Plan - 06/05/19 1420    Clinical Impression Statement  Patient is discharged from PT for his LE and low back.  He has had minimal benefit and improvement in functional mobility.  FOTO improved 7%.  Hip strength improved in abduction to 4+/5.    Personal Factors and Comorbidities  Behavior Pattern;Fitness;Past/Current Experience;Comorbidity  1;Comorbidity 2;Comorbidity 3+;Time since onset of injury/illness/exacerbation    Examination-Activity Limitations  Locomotion Level;Transfers;Bend;Squat;Stand;Lift;Stairs    PT Treatment/Interventions  ADLs/Self Care Home Management;Electrical Stimulation;Moist Heat;Traction;Ultrasound;Patient/family education;Therapeutic exercise;Therapeutic activities;Manual techniques;Gait training;Balance training;Dry needling;Functional mobility training    PT Next Visit Plan  NA, may come back for shoulder    PT Home Exercise Plan  clam, hamstring and piriformis  stretch, tennis ball for STW, row, bridge, step ups    Consulted and Agree with Plan of Care  Patient       Patient will benefit from skilled therapeutic intervention in order to improve the following deficits and impairments:  Decreased activity tolerance, Decreased mobility, Decreased strength, Decreased range of motion, Difficulty walking, Impaired flexibility, Increased muscle spasms, Improper body mechanics, Pain, Postural dysfunction, Obesity, Decreased balance, Abnormal gait  Visit Diagnosis: 1. Repeated falls   2. 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 sciatica   3. Muscle weakness (generalized)        Problem List Patient Active Problem List   Diagnosis Date Noted  . Lung nodules 05/08/2019  . Sleep disturbance 05/01/2019  . Fall (on)(from) sidewalk curb, initial encounter 04/18/2019  . Vitamin D deficiency 04/18/2019  . 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 Excessive cerumen in left ear canal 10/29/2018  . Trigger finger, right ring finger 08/13/2018  . Chest wall muscle strain 07/30/2018  . Need for influenza vaccination 07/30/2018  . Grieving 06/25/2018  . ETD (Eustachian tube dysfunction), left 06/25/2018  . Unilateral primary osteoarthritis, right knee 11/17/2016  . Complex tear of lateral meniscus of right knee as current injury 10/26/2016  . Radiculitis, lumbosacral 06/30/2016  . Diabetes ([place]) 01/01/2016  . Solitary pulmonary nodule 12/30/2015  . Urolithiasis 02/23/2015  . Hearing loss 11/25/2014  . Paresthesia 09/07/2014  . Routine general medical examination at a health care facility 07/07/2014  . Disturbance of skin sensation 12/26/2013  . UTI (urinary tract infection) 03/12/2013  . Screening for prostate cancer 02/14/2012  . Right knee pain 11/15/2011  . Encounter for long-term (current) use of other medications 01/17/2011  . Special screening examination for neoplasm of prostate 01/17/2011  . MYCOSIS FUNGOIDES 12/29/2009  . HEARING LOSS 09/30/2009  . ECZEMA  09/30/2009  . VITAMIN B12 DEFICIENCY 09/10/2009  . MORBID OBESITY 09/10/2009  . SYNCOPE 05/03/2009  . DIASTOLIC DYSFUNCTION 56/25/6389  . CARDIAC MURMUR, SYSTOLIC 37/34/2876  . Pancytopenia ([place]) 05/12/2008  . Disorder resulting from impaired renal function 05/12/2008  . EDEMA 05/12/2008  . Obstructive sleep apnea 11/04/2007  . BACK PAIN, LUMBAR 10/23/2007  . Dyslipidemia 05/20/2007  . 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 Essential hypertension 05/20/2007    Ruthella Kirchman 06/05/2019, 3:59 PM  [facility] [address] [place], [place], [address] Phone: [phone]   Fax:  [phone]  Name: [name] MRN: [id number] Date of Birth: Apr 10, 1941  PHYSICAL THERAPY DISCHARGE SUMMARY  Visits from Start of Care: 8  Current functional level related to goals / functional outcomes: See above   Remaining deficits: Min weakness in hips, core, still unable to get up from the floor without holding on.  Has fallen before but it I don't think it is due to weakness. He likely has some diabetic neuropathy which may contribute to his falls.     Education / Equipment: HEP, posture, gait, body mechanics, balance  Plan: Patient agrees to discharge.  Patient goals were partially met. Patient is being discharged due to the patient's request.  ?????    Raeford Razor, PT 06/05/19 3:59 PM Phone: [phone] Fax:  [phone]  

## 2019-06-09 DIAGNOSIS — W010XXA Fall on same level from slipping, tripping and stumbling without subsequent striking against object, initial encounter: Secondary | ICD-10-CM | POA: Insufficient documentation

## 2019-06-10 ENCOUNTER — Ambulatory Visit: Payer: Self-pay

## 2019-06-10 ENCOUNTER — Ambulatory Visit (INDEPENDENT_AMBULATORY_CARE_PROVIDER_SITE_OTHER): Payer: Medicare Other | Admitting: Orthopaedic Surgery

## 2019-06-10 DIAGNOSIS — M25511 Pain in right shoulder: Secondary | ICD-10-CM

## 2019-06-10 MED ORDER — MELOXICAM 7.5 MG PO TABS: 7.5000 mg | ORAL_TABLET | Freq: Two times a day (BID) | ORAL | 2 refills | Status: DC | PRN

## 2019-06-10 NOTE — Progress Notes (Signed)
Office Visit Note   Patient: [name]           Date of Birth: 01/13/1941           MRN: [id number] Visit Date: 06/10/2019              Requested by: Libby Maw, MD [address] [place],  [place] [address] PCP: Libby Maw, MD   Assessment & Plan: Visit Diagnoses:  1. Acute pain of right shoulder     Plan: Impression is right shoulder pain.  Think that he may have strained his rotator cuff or exacerbated some underlying tendinopathy.  Fortunately most of his strength and function are intact.  I would like to give this a little more time and try some home exercises and some meloxicam.  He is a brittle diabetic so we will try to avoid cortisone injection.  We will see him back as needed.  Follow-Up Instructions: Return if symptoms worsen or fail to improve.   Orders:  Orders Placed This Encounter  Procedures  . XR Shoulder Right   Meds ordered this encounter  Medications  . meloxicam (MOBIC) 7.5 MG tablet    Sig: Take 1 tablet (7.5 mg total) by mouth 2 (two) times daily as needed for pain.    Dispense:  30 tablet    Refill:  2      Procedures: No procedures performed   Clinical Data: No additional findings.   Subjective: Chief Complaint  Patient presents with  . Right Shoulder - Pain    [name] is a very pleasant 78 year old gentleman who is well-known to me comes in for right shoulder pain for months since he fell right onto the outside of his shoulder when he missed stepped off of his boat.  Since then he is noticed some decreased range of motion and strength.  He feels pain on the lateral deltoid region and there is pain and discomfort with lifting something away from his body with a straight arm.  Denies any numbness or tingling.  He is a brittle diabetic.   Review of Systems  Constitutional: Negative.   All other systems reviewed and are negative.    Objective: Vital Signs: There were no vitals taken for this visit.   Physical Exam Vitals signs and nursing note reviewed.  Constitutional:      Appearance: He is well-developed.  Pulmonary:     Effort: Pulmonary effort is normal.  Abdominal:     Palpations: Abdomen is soft.  Skin:    General: Skin is warm.  Neurological:     Mental Status: He is alert and oriented to person, place, and time.  Psychiatric:        Behavior: Behavior normal.        Thought Content: Thought content normal.        Judgment: Judgment normal.     Ortho Exam Right shoulder exam shows normal active and passive range of motion without significant pain.  Rotator cuff is grossly intact to manual muscle testing.  Mildly positive impingement sign. Specialty Comments:  No specialty comments available.  Imaging: Xr Shoulder Right  Result Date: 06/10/2019 Moderate degenerative AC joint arthrosis.  No acute or structural abnormalities.    PMFS History: Patient Active Problem List   Diagnosis Date Noted  . Fall on same level from slipping, tripping, or stumbling 06/09/2019  . Lung nodules 05/08/2019  . Sleep disturbance 05/01/2019  . Fall (on)(from) sidewalk curb, initial encounter 04/18/2019  .  Vitamin D deficiency 04/18/2019  . 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 Excessive cerumen in left ear canal 10/29/2018  . Trigger finger, right ring finger 08/13/2018  . Chest wall muscle strain 07/30/2018  . Need for influenza vaccination 07/30/2018  . Grieving 06/25/2018  . ETD (Eustachian tube dysfunction), left 06/25/2018  . Unilateral primary osteoarthritis, right knee 11/17/2016  . Complex tear of lateral meniscus of right knee as current injury 10/26/2016  . Radiculitis, lumbosacral 06/30/2016  . Diabetes ([place]) 01/01/2016  . Solitary pulmonary nodule 12/30/2015  . Urolithiasis 02/23/2015  . Hearing loss 11/25/2014  . Paresthesia 09/07/2014  . Routine general medical examination at a health care facility 07/07/2014  . Disturbance of skin sensation 12/26/2013  . UTI (urinary  tract infection) 03/12/2013  . Screening for prostate cancer 02/14/2012  . Right knee pain 11/15/2011  . Encounter for long-term (current) use of other medications 01/17/2011  . Special screening examination for neoplasm of prostate 01/17/2011  . MYCOSIS FUNGOIDES 12/29/2009  . HEARING LOSS 09/30/2009  . ECZEMA 09/30/2009  . VITAMIN B12 DEFICIENCY 09/10/2009  . MORBID OBESITY 09/10/2009  . SYNCOPE 05/03/2009  . DIASTOLIC DYSFUNCTION 21/30/8657  . CARDIAC MURMUR, SYSTOLIC 84/69/6295  . Pancytopenia ([place]) 05/12/2008  . Disorder resulting from impaired renal function 05/12/2008  . EDEMA 05/12/2008  . Obstructive sleep apnea 11/04/2007  . BACK PAIN, LUMBAR 10/23/2007  . Dyslipidemia 05/20/2007  . 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 Essential hypertension 05/20/2007   Past Medical History:  Diagnosis Date  . Allergy   . Anal fissure   . Anemia, unspecified   . Arthritis    Spinal OA  . BACK PAIN, LUMBAR 10/23/2007  . CARDIAC MURMUR, SYSTOLIC 11/30/4130  . DIABETES MELLITUS, TYPE I 05/20/2007  . DIASTOLIC DYSFUNCTION 4/40/1027  . 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[place])   . Duodenitis   . Edema 05/12/2008  . Esophageal stricture   . GERD (gastroesophageal reflux disease)   . 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 Gynecomastia   . Hepatic steatosis   . HYPERLIPIDEMIA 05/20/2007  . Hypertension   . Hypogonadism male   . Morbid obesity ([place]) 09/10/2009  . OBSTRUCTIVE SLEEP APNEA 11/04/2007  . Personal history of colonic polyps   . RENAL INSUFFICIENCY 05/12/2008  . Sleep apnea    uses cpap  . Urolithiasis     Family History  Problem Relation Age of Onset  . Colon cancer Brother 42  . Colon polyps Brother   . Cancer Brother        lung cancer, deceased  . Other Mother        MVA, deceased 49s  . Healthy Daughter   . Healthy Son   . Rectal cancer Neg Hx   . Stomach cancer Neg Hx     Past Surgical History:  Procedure Laterality Date  . Abdominal US  09/1997  . arm fracture Left 1958   with  hardware  . Colon cancer screening    . COLONOSCOPY  08/18/2004   diverticulitis  . COLONOSCOPY  09/24/2009  . ELECTROCARDIOGRAM  02/2006  . FLEXIBLE SIGMOIDOSCOPY  03/04/2001   polyps, anal fissure  . GANGLION CYST EXCISION Left 1980  . Lower Arterial  04/13/2004  . POLYPECTOMY    . Rest Cardiolite  03/19/2003   Social History   Occupational History    Employer: [employer]  Tobacco Use  . Smoking status: Former Smoker    Packs/day: 2.00    Years: 31.00    Pack years: 62.00  Types: Cigarettes    Quit date: 10/23/1982    Years since quitting: 36.6  . Smokeless tobacco: Never Used  Substance and Sexual Activity  . Alcohol use: No    Alcohol/week: 0.0 standard drinks  . Drug use: No  . Sexual activity: Not on file

## 2019-06-18 DIAGNOSIS — R269 Unspecified abnormalities of gait and mobility: Secondary | ICD-10-CM | POA: Insufficient documentation

## 2019-06-18 DIAGNOSIS — R296 Repeated falls: Secondary | ICD-10-CM | POA: Insufficient documentation

## 2019-07-07 ENCOUNTER — Other Ambulatory Visit: Payer: Self-pay | Admitting: Endocrinology

## 2019-07-07 NOTE — Telephone Encounter (Signed)
Please forward refill request to pt's primary care provider.   

## 2019-07-07 NOTE — Telephone Encounter (Signed)
Need to discuss this with patient, face to face.

## 2019-07-07 NOTE — Telephone Encounter (Signed)
Per Dr. Ellison's request, I am forwarding you this refill request. Please review and refill if appropriate 

## 2019-07-07 NOTE — Telephone Encounter (Signed)
Please advise if you wish to refill or refer to PCP

## 2019-07-09 ENCOUNTER — Telehealth: Payer: Self-pay

## 2019-07-09 NOTE — Telephone Encounter (Signed)

## 2019-07-10 ENCOUNTER — Other Ambulatory Visit: Payer: Self-pay

## 2019-07-10 ENCOUNTER — Encounter: Payer: Self-pay | Admitting: Family Medicine

## 2019-07-10 ENCOUNTER — Ambulatory Visit (INDEPENDENT_AMBULATORY_CARE_PROVIDER_SITE_OTHER): Payer: Medicare Other | Admitting: Family Medicine

## 2019-07-10 VITALS — BP 120/60 | HR 78 | Ht 72.0 in | Wt 283.0 lb

## 2019-07-10 DIAGNOSIS — Z9181 History of falling: Secondary | ICD-10-CM

## 2019-07-10 DIAGNOSIS — E538 Deficiency of other specified B group vitamins: Secondary | ICD-10-CM

## 2019-07-10 DIAGNOSIS — I10 Essential (primary) hypertension: Secondary | ICD-10-CM

## 2019-07-10 DIAGNOSIS — E559 Vitamin D deficiency, unspecified: Secondary | ICD-10-CM

## 2019-07-10 DIAGNOSIS — Z8739 Personal history of other diseases of the musculoskeletal system and connective tissue: Secondary | ICD-10-CM | POA: Diagnosis not present

## 2019-07-10 DIAGNOSIS — E611 Iron deficiency: Secondary | ICD-10-CM | POA: Diagnosis not present

## 2019-07-10 DIAGNOSIS — Z23 Encounter for immunization: Secondary | ICD-10-CM

## 2019-07-10 DIAGNOSIS — B356 Tinea cruris: Secondary | ICD-10-CM

## 2019-07-10 DIAGNOSIS — E7800 Pure hypercholesterolemia, unspecified: Secondary | ICD-10-CM | POA: Diagnosis not present

## 2019-07-10 DIAGNOSIS — L989 Disorder of the skin and subcutaneous tissue, unspecified: Secondary | ICD-10-CM | POA: Insufficient documentation

## 2019-07-10 LAB — LIPID PANEL
Cholesterol: 116 mg/dL (ref 0–200)
HDL: 32.2 mg/dL — ABNORMAL LOW (ref >39.00)
LDL Cholesterol: 57 mg/dL (ref 0–99)
NonHDL: 84.14
Total CHOL/HDL Ratio: 4
Triglycerides: 137 mg/dL (ref 0.0–149.0)
VLDL: 27.4 mg/dL (ref 0.0–40.0)

## 2019-07-10 LAB — URINALYSIS, ROUTINE W REFLEX MICROSCOPIC
Bilirubin Urine: NEGATIVE
Hgb urine dipstick: NEGATIVE
Ketones, ur: NEGATIVE
Nitrite: NEGATIVE
RBC / HPF: NONE SEEN (ref 0–?)
Specific Gravity, Urine: 1.02 (ref 1.000–1.030)
Total Protein, Urine: NEGATIVE
Urine Glucose: NEGATIVE
Urobilinogen, UA: 0.2 (ref 0.0–1.0)
pH: 5.5 (ref 5.0–8.0)

## 2019-07-10 LAB — MICROALBUMIN / CREATININE URINE RATIO
Creatinine,U: 58.9 mg/dL
Microalb Creat Ratio: 1.2 mg/g (ref 0.0–30.0)
Microalb, Ur: <0.7 mg/dL (ref 0.0–1.9)

## 2019-07-10 LAB — VITAMIN D 25 HYDROXY (VIT D DEFICIENCY, FRACTURES): VITD: 37.43 ng/mL (ref 30.00–100.00)

## 2019-07-10 LAB — COMPREHENSIVE METABOLIC PANEL
ALT: 24 U/L (ref 0–53)
AST: 17 U/L (ref 0–37)
Albumin: 4 g/dL (ref 3.5–5.2)
Alkaline Phosphatase: 75 U/L (ref 39–117)
BUN: 19 mg/dL (ref 6–23)
CO2: 32 mEq/L (ref 19–32)
Calcium: 9.5 mg/dL (ref 8.4–10.5)
Chloride: 104 mEq/L (ref 96–112)
Creatinine, Ser: 1.5 mg/dL (ref 0.40–1.50)
GFR: 45.3 mL/min — ABNORMAL LOW (ref >60.00)
Glucose, Bld: 126 mg/dL — ABNORMAL HIGH (ref 70–99)
Potassium: 3.9 mEq/L (ref 3.5–5.1)
Sodium: 143 mEq/L (ref 135–145)
Total Bilirubin: 0.6 mg/dL (ref 0.2–1.2)
Total Protein: 6.2 g/dL (ref 6.0–8.3)

## 2019-07-10 LAB — CBC
HCT: 43.4 % (ref 39.0–52.0)
Hemoglobin: 14.6 g/dL (ref 13.0–17.0)
MCHC: 33.6 g/dL (ref 30.0–36.0)
MCV: 92.2 fl (ref 78.0–100.0)
Platelets: 120 10*3/uL — ABNORMAL LOW (ref 150.0–400.0)
RBC: 4.7 Mil/uL (ref 4.22–5.81)
RDW: 15.2 % (ref 11.5–15.5)
WBC: 5.2 10*3/uL (ref 4.0–10.5)

## 2019-07-10 LAB — LDL CHOLESTEROL, DIRECT: Direct LDL: 69 mg/dL

## 2019-07-10 LAB — VITAMIN B12: Vitamin B-12: 408 pg/mL (ref 211–911)

## 2019-07-10 LAB — URIC ACID: Uric Acid, Serum: 5.1 mg/dL (ref 4.0–7.8)

## 2019-07-10 NOTE — Progress Notes (Signed)
Established Patient Office Visit  Subjective:  Patient ID: [name], male    DOB: 1941/05/07  Age: 78 y.o. MRN: [id number]  CC:  Chief Complaint  Patient presents with  . Follow-up    HPI [name] presents for presents for follow-up of his hypertension, elevated cholesterol, gout, vitamin D and iron and B12 deficiencies.  Continues to take all medicines as directed.  Fortunately he has had no further falls.  Rarely takes his oral Voltaren he assures me.  He was given a brief course of meloxicam for his shoulder arthritis.  Anniversary of his daughter's death is approaching in the first part of November.  His wife is so far doing well with it.  Patient was able to go for dental care this year.  Eye appointment is scheduled in the next 2 weeks.  Continues to see Dr. [name] for diabetes.  Past Medical History:  Diagnosis Date  . Allergy   . Anal fissure   . Anemia, unspecified   . Arthritis    Spinal OA  . BACK PAIN, LUMBAR 10/23/2007  . CARDIAC MURMUR, SYSTOLIC 02/27/997  . DIABETES MELLITUS, TYPE I 05/20/2007  . DIASTOLIC DYSFUNCTION 3/38/2505  . 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 Duodenitis   . Edema 05/12/2008  . Esophageal stricture   . GERD (gastroesophageal reflux disease)   . 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 Gynecomastia   . Hepatic steatosis   . HYPERLIPIDEMIA 05/20/2007  . Hypertension   . Hypogonadism male   . Morbid obesity ([place]) 09/10/2009  . OBSTRUCTIVE SLEEP APNEA 11/04/2007  . Personal history of colonic polyps   . RENAL INSUFFICIENCY 05/12/2008  . Sleep apnea    uses cpap  . Urolithiasis     Past Surgical History:  Procedure Laterality Date  . Abdominal US  09/1997  . arm fracture Left 1958   with hardware  . Colon cancer screening    . COLONOSCOPY  08/18/2004   diverticulitis  . COLONOSCOPY  09/24/2009  . ELECTROCARDIOGRAM  02/2006  . FLEXIBLE SIGMOIDOSCOPY  03/04/2001   polyps, anal fissure  . GANGLION CYST EXCISION Left 1980  . Lower  Arterial  04/13/2004  . POLYPECTOMY    . Rest Cardiolite  03/19/2003    Family History  Problem Relation Age of Onset  . Colon cancer Brother 72  . Colon polyps Brother   . Cancer Brother        lung cancer, deceased  . Other Mother        MVA, deceased 38s  . Healthy Daughter   . Healthy Son   . Rectal cancer Neg Hx   . Stomach cancer Neg Hx     Social History   Socioeconomic History  . Marital status: Married    Spouse name: Not on file  . Number of children: Not on file  . Years of education: Not on file  . Highest education level: Not on file  Occupational History    Employer: [employer]  . Financial resource strain: Not on file  . Food insecurity    Worry: Not on file    Inability: Not on file  . Transportation needs    Medical: Not on file    Non-medical: Not on file  Tobacco Use  . Smoking status: Former Smoker    Packs/day: 2.00    Years: 31.00    Pack years: 62.00    Types: Cigarettes    Quit date: 10/23/1982  Years since quitting: 36.7  . Smokeless tobacco: Never Used  Substance and Sexual Activity  . Alcohol use: No    Alcohol/week: 0.0 standard drinks  . Drug use: No  . Sexual activity: Not on file  Lifestyle  . Physical activity    Days per week: Not on file    Minutes per session: Not on file  . Stress: Not on file  Relationships  . Social Herbalist on phone: Not on file    Gets together: Not on file    Attends religious service: Not on file    Active member of club or organization: Not on file    Attends meetings of clubs or organizations: Not on file    Relationship status: Not on file  . Intimate partner violence    Fear of current or ex partner: Not on file    Emotionally abused: Not on file    Physically abused: Not on file    Forced sexual activity: Not on file  Other Topics Concern  . Not on file  Social History Narrative   Lives with wife in a one story home.  Has a son and a daughter.      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     Education: 2 years of college.    Outpatient Medications Prior to Visit  Medication Sig Dispense Refill  . Accu-Chek FastClix Lancets MISC USE TO CHECK BLOOD SUGAR UP TO 6 TIMES DAILY 612 each 3  . alfuzosin (UROXATRAL) 10 MG 24 hr tablet Take 1 tablet (10 mg total) by mouth daily with breakfast. 90 tablet 2  . allopurinol (ZYLOPRIM) 300 MG tablet TAKE 1 TABLET BY MOUTH  DAILY 90 tablet 3  . amitriptyline (ELAVIL) 25 MG tablet Take 1 tablet (25 mg total) by mouth at bedtime. 90 tablet 1  . aspirin (BAYER LOW STRENGTH) 81 MG EC tablet Take 81 mg by mouth daily.      . Blood Glucose Monitoring Suppl (ACCU-CHEK AVIVA PLUS) w/Device KIT Use as directed 1 kit 0  . carvedilol (COREG) 3.125 MG tablet TAKE 1/2 TABLETS BY MOUTH 2 TIMES DAILY WITH MEALS. 90 tablet 2  . Continuous Blood Gluc Sensor (DEXCOM G6 SENSOR) MISC Inject 1 Device into the skin as directed. Apply to skin SQ every 10 days 9 each 3  . cycloSPORINE (RESTASIS) 0.05 % ophthalmic emulsion Place 1 drop into both eyes 2 (two) times daily.      . diclofenac sodium (VOLTAREN) 1 % GEL Apply 2 g topically 4 (four) times daily. 1 Tube 1  . DULoxetine (CYMBALTA) 60 MG capsule TAKE 1 CAPSULE BY MOUTH  DAILY 30 capsule 3  . fluticasone (FLONASE) 50 MCG/ACT nasal spray Place 1 spray into the nose daily as needed for allergies.     . folic acid (FOLVITE) 1 MG tablet Take 2 mg by mouth 2 (two) times daily. 4 tabs daily    . furosemide (LASIX) 20 MG tablet Take 1 tablet (20 mg total) by mouth daily. 90 tablet 2  . glucose blood (ACCU-CHEK AVIVA PLUS) test strip USE TO TEST BLOOD SUGAR 6  TIMES PER DAY; E11.9 600 each 11  . Insulin Infusion Pump Supplies (AUTOSOFT XC INFUSION SET) MISC Inject 1 Act into the skin every other day. Use with 40m cannula and 23 inch tubing. *5 boxes (90 day supply) 5 each 3  . Insulin Infusion Pump Supplies (T:SLIM INSULIN CARTRIDGE 3ML) MISC Use with insulin  pump, fill once every 2 days. 5 each 3  . insulin lispro (HUMALOG) 100 UNIT/ML injection Inject 1.1 mLs (110 Units total) into the skin daily. (Patient taking differently: Inject 130 Units into the skin daily. ) 100 mL 0  . meloxicam (MOBIC) 7.5 MG tablet Take 1 tablet (7.5 mg total) by mouth 2 (two) times daily as needed for pain. 30 tablet 2  . mometasone (ELOCON) 0.1 % lotion APPLY TOPICALLY DAILY 60 mL 11  . Multiple Vitamins-Minerals (CENTRUM SILVER PO) Take 1 tablet by mouth daily.      . Omega-3 Fatty Acids (FISH OIL) 1200 MG CAPS Take 1,200 mg by mouth daily.     Marland Kitchen omeprazole (PRILOSEC) 20 MG capsule Take 1 capsule (20 mg total) by mouth daily as needed. 90 capsule 2  . rosuvastatin (CRESTOR) 40 MG tablet Take 1 tablet (40 mg total) by mouth at bedtime. 90 tablet 2  . vitamin B-12 (CYANOCOBALAMIN) 1000 MCG tablet Take 1,000 mcg by mouth daily.    . Vitamin D, Ergocalciferol, (DRISDOL) 1.25 MG (50000 UT) CAPS capsule Take 1 capsule (50,000 Units total) by mouth every 7 (seven) days. 5 capsule 6   No facility-administered medications prior to visit.     Allergies  Allergen Reactions  . Atorvastatin Other (See Comments)    unknown  . Niacin     REACTION: Severe heartburn  . Pioglitazone     REACTION: Edema    ROS Review of Systems  Constitutional: Negative for chills, diaphoresis, fatigue, fever and unexpected weight change.  HENT: Negative.   Eyes: Negative for photophobia and visual disturbance.  Respiratory: Negative.  Negative for cough and shortness of breath.   Cardiovascular: Negative for chest pain and palpitations.  Gastrointestinal: Negative.   Endocrine: Negative for polyphagia and polyuria.  Genitourinary: Negative for frequency and urgency.  Musculoskeletal: Positive for arthralgias and back pain.  Skin: Negative for pallor and rash.  Allergic/Immunologic: Negative for immunocompromised state.  Neurological: Negative for light-headedness and headaches.   Hematological: Does not bruise/bleed easily.  Psychiatric/Behavioral: Negative.       Objective:    Physical Exam  Constitutional: He is oriented to person, place, and time. He appears well-developed and well-nourished. No distress.  HENT:  Head: Normocephalic and atraumatic.  Right Ear: External ear normal.  Left Ear: External ear normal.  Mouth/Throat: Oropharynx is clear and moist.  Eyes: Pupils are equal, round, and reactive to light. Conjunctivae are normal. Right eye exhibits no discharge. Left eye exhibits no discharge. No scleral icterus.  Neck: Neck supple. No JVD present. No tracheal deviation present. No thyromegaly present.  Cardiovascular: Normal rate, regular rhythm and normal heart sounds.  Pulmonary/Chest: Effort normal and breath sounds normal. No stridor.  Abdominal: Bowel sounds are normal.  Genitourinary: Right testis shows no tenderness. Right testis is descended. Left testis shows no tenderness. Left testis is descended.  Musculoskeletal:        General: No edema.  Lymphadenopathy:    He has no cervical adenopathy.  Neurological: He is alert and oriented to person, place, and time.  Skin: Skin is warm and dry. He is not diaphoretic.     Psychiatric: He has a normal mood and affect. His behavior is normal.    BP 120/60   Pulse 78   Ht 6' (1.829 m)   Wt 283 lb (128.4 kg)   SpO2 97%   BMI 38.38 kg/m  Wt Readings from Last 3 Encounters:  07/10/19 283 lb (128.4 kg)  05/13/19  274 lb 12.8 oz (124.6 kg)  05/08/19 275 lb 3.2 oz (124.8 kg)   BP Readings from Last 3 Encounters:  07/10/19 120/60  05/13/19 134/76  05/08/19 116/62   Guideline developer:  UpToDate (see UpToDate for funding source) Date Released: June 2014  Health Maintenance Due  Topic Date Due  . URINE MICROALBUMIN  05/10/2019  . OPHTHALMOLOGY EXAM  06/20/2019    There are no preventive care reminders to display for this patient.  Lab Results  Component Value Date   TSH 5.91 (H)  05/09/2018   Lab Results  Component Value Date   WBC 5.0 04/17/2019   HGB 15.2 04/17/2019   HCT 44.5 04/17/2019   MCV 90.8 04/17/2019   PLT 137 (L) 04/17/2019   Lab Results  Component Value Date   NA 139 01/08/2019   K 3.1 (L) 01/08/2019   CO2 32 01/08/2019   GLUCOSE 283 (H) 01/08/2019   BUN 22 01/08/2019   CREATININE 1.71 (H) 01/08/2019   BILITOT 0.6 05/09/2018   ALKPHOS 82 05/09/2018   AST 17 05/09/2018   ALT 23 05/09/2018   PROT 6.5 05/09/2018   ALBUMIN 3.9 05/09/2018   CALCIUM 9.1 01/08/2019   ANIONGAP 8 11/28/2014   GFR 38.99 (L) 01/08/2019   Lab Results  Component Value Date   CHOL 127 05/09/2018   Lab Results  Component Value Date   HDL 34.30 (L) 05/09/2018   Lab Results  Component Value Date   LDLCALC 59 05/09/2018   Lab Results  Component Value Date   TRIG 170.0 (H) 05/09/2018   Lab Results  Component Value Date   CHOLHDL 4 05/09/2018   Lab Results  Component Value Date   HGBA1C 6.7 (A) 05/13/2019      Assessment & Plan:   Problem List Items Addressed This Visit      Cardiovascular and Mediastinum   Essential hypertension   Relevant Orders   Comprehensive metabolic panel   Urinalysis, Routine w reflex microscopic   Microalbumin / creatinine urine ratio     Other   Need for influenza vaccination   Relevant Orders   Flu Vaccine QUAD High Dose(Fluad) (Completed)   Vitamin D deficiency - Primary   Relevant Orders   VITAMIN D 25 Hydroxy (Vit-D Deficiency, Fractures)   Elevated cholesterol   Relevant Orders   Comprehensive metabolic panel   LDL cholesterol, direct   Lipid panel   B12 deficiency   Relevant Orders   Vitamin B12   Iron deficiency   Relevant Orders   CBC   Iron, TIBC and Ferritin Panel   History of fall within past 90 days   History of gout   Relevant Orders   Uric acid      No orders of the defined types were placed in this encounter.   Follow-up: Return in about 3 months (around 10/09/2019).   Patient  will use over-the-counter Lotrisone cream for the rash in his groin area.  Advised him to be sure to use it for at least 2 weeks.  Losing weight would also be helpful also for his diabetes hypertension and elevated cholesterol.  Discussed elevated risk of nonsteroidals with cardiovascular disease.  Unfortunately he has also had frequent falls.  He is using nonsteroidals very infrequently.  We did discuss also making the change over to Ultram.  We will continue to monitor.

## 2019-07-10 NOTE — Patient Instructions (Signed)
Health Maintenance After Age 78 After age 78, you are at a higher risk for certain long-term diseases and infections as well as injuries from falls. Falls are a major cause of broken bones and head injuries in people who are older than age 78. Getting regular preventive care can help to keep you healthy and well. Preventive care includes getting regular testing and making lifestyle changes as recommended by your health care provider. Talk with your health care provider about:  Which screenings and tests you should have. A screening is a test that checks for a disease when you have no symptoms.  A diet and exercise plan that is right for you. What should I know about screenings and tests to prevent falls? Screening and testing are the best ways to find a health problem early. Early diagnosis and treatment give you the best chance of managing medical conditions that are common after age 78. Certain conditions and lifestyle choices may make you more likely to have a fall. Your health care provider may recommend:  Regular vision checks. Poor vision and conditions such as cataracts can make you more likely to have a fall. If you wear glasses, make sure to get your prescription updated if your vision changes.  Medicine review. Work with your health care provider to regularly review all of the medicines you are taking, including over-the-counter medicines. Ask your health care provider about any side effects that may make you more likely to have a fall. Tell your health care provider if any medicines that you take make you feel dizzy or sleepy.  Osteoporosis screening. Osteoporosis is a condition that causes the bones to get weaker. This can make the bones weak and cause them to break more easily.  Blood pressure screening. Blood pressure changes and medicines to control blood pressure can make you feel dizzy.  Strength and balance checks. Your health care provider may recommend certain tests to check your  strength and balance while standing, walking, or changing positions.  Foot health exam. Foot pain and numbness, as well as not wearing proper footwear, can make you more likely to have a fall.  Depression screening. You may be more likely to have a fall if you have a fear of falling, feel emotionally low, or feel unable to do activities that you used to do.  Alcohol use screening. Using too much alcohol can affect your balance and may make you more likely to have a fall. What actions can I take to lower my risk of falls? General instructions  Talk with your health care provider about your risks for falling. Tell your health care provider if: ? You fall. Be sure to tell your health care provider about all falls, even ones that seem minor. ? You feel dizzy, sleepy, or off-balance.  Take over-the-counter and prescription medicines only as told by your health care provider. These include any supplements.  Eat a healthy diet and maintain a healthy weight. A healthy diet includes low-fat dairy products, low-fat (lean) meats, and fiber from whole grains, beans, and lots of fruits and vegetables. Home safety  Remove any tripping hazards, such as rugs, cords, and clutter.  Install safety equipment such as grab bars in bathrooms and safety rails on stairs.  Keep rooms and walkways well-lit. Activity   Follow a regular exercise program to stay fit. This will help you maintain your balance. Ask your health care provider what types of exercise are appropriate for you.  If you need a cane or   walker, use it as recommended by your health care provider.  Wear supportive shoes that have nonskid soles. Lifestyle  Do not drink alcohol if your health care provider tells you not to drink.  If you drink alcohol, limit how much you have: ? 0-1 drink a day for women. ? 0-2 drinks a day for men.  Be aware of how much alcohol is in your drink. In the [place] one drink equals one typical bottle of beer (12  oz), one-half glass of wine (5 oz), or one shot of hard liquor (1 oz).  Do not use any products that contain nicotine or tobacco, such as cigarettes and e-cigarettes. If you need help quitting, ask your health care provider. Summary  Having a healthy lifestyle and getting preventive care can help to protect your health and wellness after age 78.  Screening and testing are the best way to find a health problem early and help you avoid having a fall. Early diagnosis and treatment give you the best chance for managing medical conditions that are more common for people who are older than age 78.  Falls are a major cause of broken bones and head injuries in people who are older than age 78. Take precautions to prevent a fall at home.  Work with your health care provider to learn what changes you can make to improve your health and wellness and to prevent falls. This information is not intended to replace advice given to you by your health care provider. Make sure you discuss any questions you have with your health care provider. Document Released: 08/22/2017 Document Revised: 01/30/2019 Document Reviewed: 08/22/2017 Elsevier Patient Education  2020 [employer]  

## 2019-07-11 LAB — IRON,TIBC AND FERRITIN PANEL
%SAT: 34 % (calc) (ref 20–48)
Ferritin: 131 ng/mL (ref 24–380)
Iron: 100 ug/dL (ref 50–180)
TIBC: 291 mcg/dL (calc) (ref 250–425)

## 2019-08-01 ENCOUNTER — Ambulatory Visit: Payer: Medicare Other | Admitting: Physical Medicine & Rehabilitation

## 2019-08-03 ENCOUNTER — Other Ambulatory Visit: Payer: Self-pay | Admitting: Endocrinology

## 2019-08-04 NOTE — Telephone Encounter (Signed)
Per Dr. Ellison's request, I am forwarding you this refill request. Please review and refill if appropriate 

## 2019-08-04 NOTE — Telephone Encounter (Signed)
Please forward refill request to pt's primary care provider.   

## 2019-08-05 ENCOUNTER — Ambulatory Visit: Payer: Medicare Other | Admitting: Physical Medicine & Rehabilitation

## 2019-08-11 ENCOUNTER — Other Ambulatory Visit: Payer: Self-pay

## 2019-08-13 ENCOUNTER — Other Ambulatory Visit: Payer: Self-pay

## 2019-08-13 ENCOUNTER — Ambulatory Visit (INDEPENDENT_AMBULATORY_CARE_PROVIDER_SITE_OTHER): Payer: Medicare Other | Admitting: Endocrinology

## 2019-08-13 ENCOUNTER — Encounter: Payer: Self-pay | Admitting: Endocrinology

## 2019-08-13 VITALS — BP 122/50 | HR 86 | Ht 72.0 in | Wt 282.6 lb

## 2019-08-13 DIAGNOSIS — Z794 Long term (current) use of insulin: Secondary | ICD-10-CM | POA: Diagnosis not present

## 2019-08-13 DIAGNOSIS — E1129 Type 2 diabetes mellitus with other diabetic kidney complication: Secondary | ICD-10-CM

## 2019-08-13 DIAGNOSIS — E0800 Diabetes mellitus due to underlying condition with hyperosmolarity without nonketotic hyperglycemic-hyperosmolar coma (NKHHC): Secondary | ICD-10-CM

## 2019-08-13 LAB — POCT GLYCOSYLATED HEMOGLOBIN (HGB A1C): Hemoglobin A1C: 6.2 % — AB (ref 4.0–5.6)

## 2019-08-13 NOTE — Progress Notes (Signed)
Subjective:    Patient ID: [name], male    DOB: 1941/08/10, 78 y.o.   MRN: [id number]  HPI Pt returns for f/u of diabetes mellitus: DM type: Insulin-requiring type 2 Dx'ed: 0347 Complications: polyneuropathy, CAD, and renal insufficiency.  Therapy: insulin since 1995.  DKA: never.  Severe hypoglycemia: never.  Pancreatitis: never.  Other: he started pump therapy (now T-slim, since mid-2015), and dexcom G-6 continuous glucose monitor.   Interval history: he takes these pump settings:   basal rate of 2.6 units/hr, 24 hrs per day.   mealtime bolus of 1 unit/2 grams carbohydrate.  However, subtract 14 units from your calculated lunch bolus.  continue correction bolus (which some people call "sensitivity," or "insulin sensitivity ratio," or just "isr") of 1 unit for each 10 by which your glucose exceeds 100.   TDD is approx 108 units (60% of which is basal).   I reviewed continuous glucose monitor data.  Glucose varies from 55-270 (but pt says cbg is as low as 70).  It is in general lowest fasting, but there is not much trend throughout the day.   Past Medical History:  Diagnosis Date  . Allergy   . Anal fissure   . Anemia, unspecified   . Arthritis    Spinal OA  . BACK PAIN, LUMBAR 10/23/2007  . CARDIAC MURMUR, SYSTOLIC 01/23/5955  . DIABETES MELLITUS, TYPE I 05/20/2007  . DIASTOLIC DYSFUNCTION 3/87/5643  . 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[place])   . Duodenitis   . Edema 05/12/2008  . Esophageal stricture   . GERD (gastroesophageal reflux disease)   . 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 Gynecomastia   . Hepatic steatosis   . HYPERLIPIDEMIA 05/20/2007  . Hypertension   . Hypogonadism male   . Morbid obesity ([place]) 09/10/2009  . OBSTRUCTIVE SLEEP APNEA 11/04/2007  . Personal history of colonic polyps   . RENAL INSUFFICIENCY 05/12/2008  . Sleep apnea    uses cpap  . Urolithiasis     Past Surgical History:  Procedure Laterality Date  . Abdominal US  09/1997  . arm fracture Left  1958   with hardware  . Colon cancer screening    . COLONOSCOPY  08/18/2004   diverticulitis  . COLONOSCOPY  09/24/2009  . ELECTROCARDIOGRAM  02/2006  . FLEXIBLE SIGMOIDOSCOPY  03/04/2001   polyps, anal fissure  . GANGLION CYST EXCISION Left 1980  . Lower Arterial  04/13/2004  . POLYPECTOMY    . Rest Cardiolite  03/19/2003    Social History   Socioeconomic History  . Marital status: Married    Spouse name: Not on file  . Number of children: Not on file  . Years of education: Not on file  . Highest education level: Not on file  Occupational History    Employer: [employer]  . Financial resource strain: Not on file  . Food insecurity    Worry: Not on file    Inability: Not on file  . Transportation needs    Medical: Not on file    Non-medical: Not on file  Tobacco Use  . Smoking status: Former Smoker    Packs/day: 2.00    Years: 31.00    Pack years: 62.00    Types: Cigarettes    Quit date: 10/23/1982    Years since quitting: 36.8  . Smokeless tobacco: Never Used  Substance and Sexual Activity  . Alcohol use: No    Alcohol/week: 0.0 standard drinks  . Drug use: No  .  Sexual activity: Not on file  Lifestyle  . Physical activity    Days per week: Not on file    Minutes per session: Not on file  . Stress: Not on file  Relationships  . Social Herbalist on phone: Not on file    Gets together: Not on file    Attends religious service: Not on file    Active member of club or organization: Not on file    Attends meetings of clubs or organizations: Not on file    Relationship status: Not on file  . Intimate partner violence    Fear of current or ex partner: Not on file    Emotionally abused: Not on file    Physically abused: Not on file    Forced sexual activity: Not on file  Other Topics Concern  . Not on file  Social History Narrative   Lives with wife in a one story home.  Has a son and a daughter.     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     Education: 2 years of college.    Current Outpatient Medications on File Prior to Visit  Medication Sig Dispense Refill  . Accu-Chek FastClix Lancets MISC USE TO CHECK BLOOD SUGAR UP TO 6 TIMES DAILY 612 each 3  . alfuzosin (UROXATRAL) 10 MG 24 hr tablet Take 1 tablet (10 mg total) by mouth daily with breakfast. 90 tablet 2  . allopurinol (ZYLOPRIM) 300 MG tablet TAKE 1 TABLET BY MOUTH  DAILY 90 tablet 3  . amitriptyline (ELAVIL) 25 MG tablet Take 1 tablet (25 mg total) by mouth at bedtime. 90 tablet 1  . aspirin (BAYER LOW STRENGTH) 81 MG EC tablet Take 81 mg by mouth daily.      . Blood Glucose Monitoring Suppl (ACCU-CHEK AVIVA PLUS) w/Device KIT Use as directed 1 kit 0  . carvedilol (COREG) 3.125 MG tablet TAKE 1/2 TABLETS BY MOUTH 2 TIMES DAILY WITH MEALS. 90 tablet 2  . Continuous Blood Gluc Sensor (DEXCOM G6 SENSOR) MISC Inject 1 Device into the skin as directed. Apply to skin SQ every 10 days 9 each 3  . cycloSPORINE (RESTASIS) 0.05 % ophthalmic emulsion Place 1 drop into both eyes 2 (two) times daily.      . diclofenac sodium (VOLTAREN) 1 % GEL Apply 2 g topically 4 (four) times daily. 1 Tube 1  . DULoxetine (CYMBALTA) 60 MG capsule TAKE 1 CAPSULE BY MOUTH  DAILY 30 capsule 3  . fluticasone (FLONASE) 50 MCG/ACT nasal spray Place 1 spray into the nose daily as needed for allergies.     . folic acid (FOLVITE) 1 MG tablet Take 2 mg by mouth 2 (two) times daily. 4 tabs daily    . furosemide (LASIX) 20 MG tablet Take 1 tablet (20 mg total) by mouth daily. 90 tablet 2  . glucose blood (ACCU-CHEK AVIVA PLUS) test strip USE TO TEST BLOOD SUGAR 6  TIMES PER DAY; E11.9 600 each 11  . Insulin Infusion Pump Supplies (AUTOSOFT XC INFUSION SET) MISC Inject 1 Act into the skin every other day. Use with 23m cannula and 23 inch tubing. *5 boxes (90 day supply) 5 each 3  . Insulin Infusion Pump Supplies (T:SLIM INSULIN CARTRIDGE 3ML) MISC Use with insulin pump, fill once  every 2 days. 5 each 3  . insulin lispro (HUMALOG) 100 UNIT/ML injection Inject 1.1 mLs (110 Units total) into the skin daily. (Patient taking differently: Inject  130 Units into the skin daily. ) 100 mL 0  . meloxicam (MOBIC) 7.5 MG tablet Take 1 tablet (7.5 mg total) by mouth 2 (two) times daily as needed for pain. 30 tablet 2  . mometasone (ELOCON) 0.1 % lotion APPLY TOPICALLY DAILY 60 mL 11  . Multiple Vitamins-Minerals (CENTRUM SILVER PO) Take 1 tablet by mouth daily.      . Omega-3 Fatty Acids (FISH OIL) 1200 MG CAPS Take 1,200 mg by mouth daily.     Marland Kitchen omeprazole (PRILOSEC) 20 MG capsule Take 1 capsule (20 mg total) by mouth daily as needed. 90 capsule 2  . rosuvastatin (CRESTOR) 40 MG tablet Take 1 tablet (40 mg total) by mouth at bedtime. 90 tablet 2  . vitamin B-12 (CYANOCOBALAMIN) 1000 MCG tablet Take 1,000 mcg by mouth daily.    . Vitamin D, Ergocalciferol, (DRISDOL) 1.25 MG (50000 UT) CAPS capsule Take 1 capsule (50,000 Units total) by mouth every 7 (seven) days. 5 capsule 6   No current facility-administered medications on file prior to visit.     Allergies  Allergen Reactions  . Atorvastatin Other (See Comments)    unknown  . Niacin     REACTION: Severe heartburn  . Pioglitazone     REACTION: Edema    Family History  Problem Relation Age of Onset  . Colon cancer Brother 54  . Colon polyps Brother   . Cancer Brother        lung cancer, deceased  . Other Mother        MVA, deceased 84s  . Healthy Daughter   . Healthy Son   . Rectal cancer Neg Hx   . Stomach cancer Neg Hx     BP (!) 122/50 (BP Location: Left Arm, Patient Position: Sitting, Cuff Size: Large)   Pulse 86   Ht 6' (1.829 m)   Wt 282 lb 9.6 oz (128.2 kg)   SpO2 93%   BMI 38.33 kg/m    Review of Systems He denies LOC    Objective:   Physical Exam VITAL SIGNS:  See vs page GENERAL: no distress Pulses: dorsalis pedis intact bilat.   MSK: no deformity of the feet CV: 1+ bilat leg edema.    Skin:  no ulcer on the feet.  normal color and temp on the feet. Neuro: sensation is intact to touch on the feet  Lab Results  Component Value Date   CREATININE 1.50 07/10/2019   BUN 19 07/10/2019   NA 143 07/10/2019   K 3.9 07/10/2019   CL 104 07/10/2019   CO2 32 07/10/2019    Lab Results  Component Value Date   HGBA1C 6.2 (A) 08/13/2019      Assessment & Plan:  Insulin-requiring type 2 DM: overcontrolled Renal insuff: in this setting, basal is chosen for reduction Hypoglycemia: this limits aggressiveness of glycemic control   Patient Instructions  Please take these pump settings:  basal rate of 2.4 units/hr, 24 hrs per day.   mealtime bolus of 1 unit/2 grams carbohydrate.  However, subtract 14 units from your calculated lunch bolus.  continue correction bolus (which some people call "sensitivity," or "insulin sensitivity ratio," or just "isr") of 1 unit for each 10 by which your glucose exceeds 100.   Please come back for a follow-up appointment in 3 months.

## 2019-08-13 NOTE — Patient Instructions (Addendum)
Please take these pump settings:  basal rate of 2.4 units/hr, 24 hrs per day.   mealtime bolus of 1 unit/2 grams carbohydrate.  However, subtract 14 units from your calculated lunch bolus.  continue correction bolus (which some people call "sensitivity," or "insulin sensitivity ratio," or just "isr") of 1 unit for each 10 by which your glucose exceeds 100.   Please come back for a follow-up appointment in 3 months.

## 2019-08-18 ENCOUNTER — Other Ambulatory Visit: Payer: Self-pay | Admitting: Endocrinology

## 2019-08-18 ENCOUNTER — Encounter: Payer: Medicare Other | Attending: Physical Medicine & Rehabilitation | Admitting: Physical Medicine & Rehabilitation

## 2019-08-18 ENCOUNTER — Other Ambulatory Visit: Payer: Self-pay

## 2019-08-18 ENCOUNTER — Encounter: Payer: Self-pay | Admitting: Physical Medicine & Rehabilitation

## 2019-08-18 VITALS — BP 146/83 | HR 77 | Temp 97.5°F | Ht 72.0 in | Wt 285.0 lb

## 2019-08-18 DIAGNOSIS — M461 Sacroiliitis, not elsewhere classified: Secondary | ICD-10-CM | POA: Diagnosis present

## 2019-08-18 DIAGNOSIS — G8929 Other chronic pain: Secondary | ICD-10-CM | POA: Insufficient documentation

## 2019-08-18 DIAGNOSIS — G479 Sleep disorder, unspecified: Secondary | ICD-10-CM | POA: Diagnosis present

## 2019-08-18 DIAGNOSIS — M791 Myalgia, unspecified site: Secondary | ICD-10-CM

## 2019-08-18 DIAGNOSIS — M545 Low back pain, unspecified: Secondary | ICD-10-CM

## 2019-08-18 DIAGNOSIS — E1142 Type 2 diabetes mellitus with diabetic polyneuropathy: Secondary | ICD-10-CM | POA: Diagnosis not present

## 2019-08-18 DIAGNOSIS — R269 Unspecified abnormalities of gait and mobility: Secondary | ICD-10-CM | POA: Diagnosis present

## 2019-08-18 MED ORDER — INSULIN LISPRO 100 UNIT/ML ~~LOC~~ SOLN: 110.0000 [IU] | Freq: Every day | SUBCUTANEOUS | 0 refills | Status: DC

## 2019-08-18 NOTE — Progress Notes (Signed)
Subjective:    Patient ID: [name], male    DOB: Feb 28, 1941, 78 y.o.   MRN: [id number]  HPI  Male with pmh of OSA, CKD, HTN, DM type 1 with peripheral neuropathy presents for follow up of low back pain > neck pain.   On first visit, pt complained of b/l L>R back pain.  Started ~>20 years ago.  No inciting event.  Sitting/laying improve the pain.  Standing exacerbates the pain.  Sharp, burning pain.  Radiates to left posterior thigh.  Intermittent.  He has associated numbness/tingling.  He only tried ASA, which helps some.  Pain limits from doing ADLs and fishing.  He denies falls.  Last clinic visit on 05/01/2019.  Since that time, he notes prolonged standing and ambulation exacerbates the pain, but brief rest improves the pain. Takes Baclofen 1/day. Takes Cymbalta. He states he fell 3 times negotiating steps. He went to PT. He has gained weight. He uses cane most of the time.  Prescribed Mobic by Ortho.    Pain Inventory Average Pain 3 Pain Right Now 3 My pain is sharp, burning and tingling  In the last 24 hours, has pain interfered with the following? General activity 9 Relation with others 3 Enjoyment of life 3 What TIME of day is your pain at its worst? daytime Sleep (in general) Good  Pain is worse with: walking, standing and some activites Pain improves with: rest, heat/ice and TENS Relief from Meds: na  Mobility walk with assistance use a cane ability to climb steps?  yes do you drive?  yes  Function retired  Neuro/Psych weakness numbness tingling  Prior Studies Any changes since last visit?  no  Physicians involved in your care Any changes since last visit?  yes Primary care Dr. Abelino Derrick   Family History  Problem Relation Age of Onset  . Colon cancer Brother 34  . Colon polyps Brother   . Cancer Brother        lung cancer, deceased  . Other Mother        MVA, deceased 68s  . Healthy Daughter   . Healthy Son   . Rectal cancer Neg Hx   .  Stomach cancer Neg Hx    Social History   Socioeconomic History  . Marital status: Married    Spouse name: Not on file  . Number of children: Not on file  . Years of education: Not on file  . Highest education level: Not on file  Occupational History    Employer: [employer]  . Financial resource strain: Not on file  . Food insecurity    Worry: Not on file    Inability: Not on file  . Transportation needs    Medical: Not on file    Non-medical: Not on file  Tobacco Use  . Smoking status: Former Smoker    Packs/day: 2.00    Years: 31.00    Pack years: 62.00    Types: Cigarettes    Quit date: 10/23/1982    Years since quitting: 36.8  . Smokeless tobacco: Never Used  Substance and Sexual Activity  . Alcohol use: No    Alcohol/week: 0.0 standard drinks  . Drug use: No  . Sexual activity: Not on file  Lifestyle  . Physical activity    Days per week: Not on file    Minutes per session: Not on file  . Stress: Not on file  Relationships  . Social connections  Talks on phone: Not on file    Gets together: Not on file    Attends religious service: Not on file    Active member of club or organization: Not on file    Attends meetings of clubs or organizations: Not on file    Relationship status: Not on file  Other Topics Concern  . Not on file  Social History Narrative   Lives with wife in a one story home.  Has a son and a daughter.     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     Education: 2 years of college.   Past Surgical History:  Procedure Laterality Date  . Abdominal US  09/1997  . arm fracture Left 1958   with hardware  . Colon cancer screening    . COLONOSCOPY  08/18/2004   diverticulitis  . COLONOSCOPY  09/24/2009  . ELECTROCARDIOGRAM  02/2006  . FLEXIBLE SIGMOIDOSCOPY  03/04/2001   polyps, anal fissure  . GANGLION CYST EXCISION Left 1980  . Lower Arterial  04/13/2004  . POLYPECTOMY    . Rest Cardiolite  03/19/2003    Past Medical History:  Diagnosis Date  . Allergy   . Anal fissure   . Anemia, unspecified   . Arthritis    Spinal OA  . BACK PAIN, LUMBAR 10/23/2007  . CARDIAC MURMUR, SYSTOLIC 10/28/1094  . DIABETES MELLITUS, TYPE I 05/20/2007  . DIASTOLIC DYSFUNCTION 0/45/4098  . 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[place])   . Duodenitis   . Edema 05/12/2008  . Esophageal stricture   . GERD (gastroesophageal reflux disease)   . 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 Gynecomastia   . Hepatic steatosis   . HYPERLIPIDEMIA 05/20/2007  . Hypertension   . Hypogonadism male   . Morbid obesity ([place]) 09/10/2009  . OBSTRUCTIVE SLEEP APNEA 11/04/2007  . Personal history of colonic polyps   . RENAL INSUFFICIENCY 05/12/2008  . Sleep apnea    uses cpap  . Urolithiasis    BP (!) 146/83   Pulse 77   Temp (!) 97.5 F (36.4 C)   Ht 6' (1.829 m)   Wt 285 lb (129.3 kg)   SpO2 98%   BMI 38.65 kg/m   Opioid Risk Score:   Fall Risk Score:  `1  Depression screen PHQ 2/9  Depression screen [facility] 2/9 09/18/2018 09/20/2017 08/31/2016  Decreased Interest 0 0 0  Down, Depressed, Hopeless 0 0 0  PHQ - 2 Score 0 0 0  Altered sleeping - - 0  Tired, decreased energy - - 1  Change in appetite - - 0  Feeling bad or failure about yourself  - - 0  Trouble concentrating - - 0  Moving slowly or fidgety/restless - - 0  Suicidal thoughts - - 0  PHQ-9 Score - - 1  Difficult doing work/chores - - Somewhat difficult  Some recent data might be hidden    Review of Systems  HENT: Negative.   Eyes: Negative.   Respiratory: Positive for apnea and shortness of breath.   Cardiovascular: Negative.   Gastrointestinal: Negative.   Endocrine:       Diabetic High blood sugar  Genitourinary: Negative.           Musculoskeletal: Positive for arthralgias, back pain, gait problem, myalgias and neck pain.  Skin: Negative.   Allergic/Immunologic: Negative.   Neurological: Positive for weakness and numbness.       Tingling   Hematological: Negative.   All other systems reviewed  and are negative.     Objective:   Physical Exam Constitutional: NAD. Vital signs reviewed. HENT: Normocephalic. Atraumatic.  Eyes: EOMI. No discharge. Cardiovascular: No JVD. Respiratory: Normal effort. GI: Non-distended. MSK:  Gait mildly antalgic.   +Mild TTP right SI joint  No edema.   ROM WNL Neuro: Alert  Strength  5/5 in all LE myotomes Skin: Warm and Dry. Intact.     Assessment & Plan:  Male with pmh of OSA, CKD, HTN, DM type 1 with peripheral neuropathy presents for follow up of low back pain > neck pain.    1. Chronic mechanical low back pain  MRIs C,L-spine early 2016 suggesting multilevel spondylosis with mild b/l multilevel foraminal narrowing C4-C7 and shallow disc bulge at L5-S1 without central canal or foraminal stenosis.  Avoid NSAIDs due to CKD  Unable to tolerate Gabapentin, Lyrica  Voltaren gel denied by insurance   Cont HEP  Cont TENs  Aquatic therapy, not going anymore, believes he can just live with the pain and that he is lazy  Cont tylenol  Cont Robaxin 500 TID PRN, changed to Baclofen 10 TID PRN due to change insurance  Cont Cymbalta 60mg   Cont back brace during periods of excessive activity, reminded to avoid prolonged use  Cont Lidoderm OTC  Acknowledges he can do more if he make a conscious effort  PT completed recently  2. Sacroiliitis  See #1  Cont brace  Not main pain generator at present  3. Sleep disturbance  Recently new mattress  Cont CPAP  Will decrease Elavil to 12.5qhs (1/2 tab for 2 weeks) then d/c  Trial Melatonin   4. Myalgia  Good benefit with trigger point injections for a short period  No need to repeat at present  5. Diabetic neuropathy  See #1, #3  6. Morbid obesity  Not interested in seeing dietitian at this time  Weight is increasing  7. Gait abnormality  Recently completed therapies  Cont cane for safety

## 2019-08-18 NOTE — Patient Instructions (Signed)
[place] Melatonin

## 2019-09-15 ENCOUNTER — Encounter: Payer: Self-pay | Admitting: Physical Medicine & Rehabilitation

## 2019-09-15 ENCOUNTER — Other Ambulatory Visit: Payer: Self-pay

## 2019-09-15 ENCOUNTER — Encounter: Payer: Medicare Other | Attending: Physical Medicine & Rehabilitation | Admitting: Physical Medicine & Rehabilitation

## 2019-09-15 VITALS — BP 111/68 | HR 73 | Temp 97.7°F | Ht 72.0 in | Wt 276.2 lb

## 2019-09-15 DIAGNOSIS — G8929 Other chronic pain: Secondary | ICD-10-CM

## 2019-09-15 DIAGNOSIS — E1142 Type 2 diabetes mellitus with diabetic polyneuropathy: Secondary | ICD-10-CM

## 2019-09-15 DIAGNOSIS — R269 Unspecified abnormalities of gait and mobility: Secondary | ICD-10-CM

## 2019-09-15 DIAGNOSIS — G479 Sleep disorder, unspecified: Secondary | ICD-10-CM | POA: Diagnosis present

## 2019-09-15 DIAGNOSIS — M545 Low back pain: Secondary | ICD-10-CM | POA: Insufficient documentation

## 2019-09-15 DIAGNOSIS — M461 Sacroiliitis, not elsewhere classified: Secondary | ICD-10-CM | POA: Diagnosis present

## 2019-09-15 DIAGNOSIS — M791 Myalgia, unspecified site: Secondary | ICD-10-CM | POA: Diagnosis present

## 2019-09-15 DIAGNOSIS — M792 Neuralgia and neuritis, unspecified: Secondary | ICD-10-CM

## 2019-09-15 MED ORDER — DICLOFENAC SODIUM 1 % EX GEL: 2.0000 g | Freq: Four times a day (QID) | CUTANEOUS | 1 refills | Status: DC

## 2019-09-15 MED ORDER — AMITRIPTYLINE HCL 50 MG PO TABS: 50.0000 mg | ORAL_TABLET | Freq: Every day | ORAL | 2 refills | Status: DC

## 2019-09-15 NOTE — Progress Notes (Signed)
Subjective:    Patient ID: [name], male    DOB: 1941/08/03, 78 y.o.   MRN: [id number]  HPI  Male with pmh of OSA, CKD, HTN, DM type 1 with peripheral neuropathy presents for follow up of low back pain > neck pain.   On first visit, pt complained of b/l L>R back pain.  Started ~>20 years ago.  No inciting event.  Sitting/laying improve the pain.  Standing exacerbates the pain.  Sharp, burning pain.  Radiates to left posterior thigh.  Intermittent.  He has associated numbness/tingling.  He only tried ASA, which helps some.  Pain limits from doing ADLs and fishing.  He denies falls.  Last clinic visit on 08/18/2019.  Since that time, he is taking Baclofen 2-3/week. D/ced Elavil without issue. Sleep is improved. He using cane. Denies falls.   Pain Inventory Average Pain 4 Pain Right Now 5 My pain is sharp  In the last 24 hours, has pain interfered with the following? General activity 5 Relation with others 3 Enjoyment of life 5 What TIME of day is your pain at its worst? daytime Sleep (in general) Good  Pain is worse with: walking, standing and some activites Pain improves with: rest, heat/ice and TENS Relief from Meds: na  Mobility walk with assistance use a cane ability to climb steps?  yes do you drive?  yes  Function retired  Neuro/Psych weakness numbness tingling  Prior Studies Any changes since last visit?  no  Physicians involved in your care Any changes since last visit?  yes Primary care Dr. Abelino Derrick   Family History  Problem Relation Age of Onset  . Colon cancer Brother 88  . Colon polyps Brother   . Cancer Brother        lung cancer, deceased  . Other Mother        MVA, deceased 73s  . Healthy Daughter   . Healthy Son   . Rectal cancer Neg Hx   . Stomach cancer Neg Hx    Social History   Socioeconomic History  . Marital status: Married    Spouse name: Not on file  . Number of children: Not on file  . Years of education: Not on file   . Highest education level: Not on file  Occupational History    Employer: [employer]  . Financial resource strain: Not on file  . Food insecurity    Worry: Not on file    Inability: Not on file  . Transportation needs    Medical: Not on file    Non-medical: Not on file  Tobacco Use  . Smoking status: Former Smoker    Packs/day: 2.00    Years: 31.00    Pack years: 62.00    Types: Cigarettes    Quit date: 10/23/1982    Years since quitting: 36.9  . Smokeless tobacco: Never Used  Substance and Sexual Activity  . Alcohol use: No    Alcohol/week: 0.0 standard drinks  . Drug use: No  . Sexual activity: Not on file  Lifestyle  . Physical activity    Days per week: Not on file    Minutes per session: Not on file  . Stress: Not on file  Relationships  . Social Herbalist on phone: Not on file    Gets together: Not on file    Attends religious service: Not on file    Active member of club or organization: Not on file  Attends meetings of clubs or organizations: Not on file    Relationship status: Not on file  Other Topics Concern  . Not on file  Social History Narrative   Lives with wife in a one story home.  Has a son and a daughter.     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     Education: 2 years of college.   Past Surgical History:  Procedure Laterality Date  . Abdominal US  09/1997  . arm fracture Left 1958   with hardware  . Colon cancer screening    . COLONOSCOPY  08/18/2004   diverticulitis  . COLONOSCOPY  09/24/2009  . ELECTROCARDIOGRAM  02/2006  . FLEXIBLE SIGMOIDOSCOPY  03/04/2001   polyps, anal fissure  . GANGLION CYST EXCISION Left 1980  . Lower Arterial  04/13/2004  . POLYPECTOMY    . Rest Cardiolite  03/19/2003   Past Medical History:  Diagnosis Date  . Allergy   . Anal fissure   . Anemia, unspecified   . Arthritis    Spinal OA  . BACK PAIN, LUMBAR 10/23/2007  . CARDIAC MURMUR, SYSTOLIC 11/30/7865   . DIABETES MELLITUS, TYPE I 05/20/2007  . DIASTOLIC DYSFUNCTION 6/72/0947  . 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[place])   . Duodenitis   . Edema 05/12/2008  . Esophageal stricture   . GERD (gastroesophageal reflux disease)   . 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 Gynecomastia   . Hepatic steatosis   . HYPERLIPIDEMIA 05/20/2007  . Hypertension   . Hypogonadism male   . Morbid obesity ([place]) 09/10/2009  . OBSTRUCTIVE SLEEP APNEA 11/04/2007  . Personal history of colonic polyps   . RENAL INSUFFICIENCY 05/12/2008  . Sleep apnea    uses cpap  . Urolithiasis    BP 111/68   Pulse 73   Temp 97.7 F (36.5 C)   Ht 6' (1.829 m)   Wt 276 lb 3.2 oz (125.3 kg)   SpO2 96%   BMI 37.46 kg/m   Opioid Risk Score:   Fall Risk Score:  `1  Depression screen PHQ 2/9  Depression screen [facility] 2/9 09/18/2018 09/20/2017 08/31/2016  Decreased Interest 0 0 0  Down, Depressed, Hopeless 0 0 0  PHQ - 2 Score 0 0 0  Altered sleeping - - 0  Tired, decreased energy - - 1  Change in appetite - - 0  Feeling bad or failure about yourself  - - 0  Trouble concentrating - - 0  Moving slowly or fidgety/restless - - 0  Suicidal thoughts - - 0  PHQ-9 Score - - 1  Difficult doing work/chores - - Somewhat difficult  Some recent data might be hidden    Review of Systems  HENT: Negative.   Eyes: Negative.   Respiratory: Positive for apnea and shortness of breath.   Cardiovascular: Negative.   Gastrointestinal: Negative.   Endocrine:       Diabetic High blood sugar  Genitourinary: Negative.           Musculoskeletal: Positive for arthralgias, back pain, myalgias and neck pain.  Skin: Negative.   Allergic/Immunologic: Negative.   Neurological: Positive for weakness and numbness.       Tingling  Hematological: Negative.       Objective:   Physical Exam  Constitutional: No distress . Vital signs reviewed. HENT: Normocephalic.  Atraumatic. Eyes: EOMI. No discharge. Cardiovascular: No JVD. Respiratory: Normal  effort.  No stridor. GI: Non-distended. Skin: Warm and dry.  Intact. Psych: Normal  mood.  Normal behavior. Musc: Gait antalgic  +Mild TTP right SI joint             No edema.              ROM WNL Neuro: Alert             Strength          5/5 in all LE myotomes    Assessment & Plan:  Male with pmh of OSA, CKD, HTN, DM type 1 with peripheral neuropathy presents for follow up of low back pain > neck pain.    1. Chronic mechanical low back pain             MRIs C,L-spine early 2016 suggesting multilevel spondylosis with mild b/l multilevel foraminal narrowing C4-C7 and shallow disc bulge at L5-S1 without central canal or foraminal stenosis.             Avoid NSAIDs due to CKD             Unable to tolerate Gabapentin, Lyrica             Voltaren gel denied by insurance              Cont HEP             Cont TENs             Aquatic therapy, not going anymore, believes he can just live with the pain and that he is lazy             Cont tylenol             Cont Robaxin 500 TID PRN, changed to Baclofen 10 TID PRN due to change insurance, tolerating              Cont Cymbalta 60mg              Cont back brace during periods of excessive activity, reminded to avoid prolonged use             Cont Lidoderm OTC             Acknowledges he can do more if he make a conscious effort             PT completed  2. Sacroiliitis             See #1             Cont brace             Not main pain generator at present, does not wish to proceed with injection at this time, particularly due to DM  3. Sleep disturbance  Recently new mattress  Cont CPAP  Improved  4. Myalgia             Good benefit with trigger point injections for a short period             No need to repeat at present  5. Diabetic neuropathy             See #1, #3  Will increase Elavil to 50mg  qhs, educated on signs/symptoms of seratonin syndrome   6. Morbid obesity             Not interested in seeing dietitian at  this time             Weight is increasing  7. Gait abnormality  Completed therapies             Cont cane for safety

## 2019-10-08 ENCOUNTER — Other Ambulatory Visit: Payer: Self-pay

## 2019-10-09 ENCOUNTER — Ambulatory Visit (INDEPENDENT_AMBULATORY_CARE_PROVIDER_SITE_OTHER): Payer: Medicare Other | Admitting: Family Medicine

## 2019-10-09 ENCOUNTER — Encounter: Payer: Self-pay | Admitting: Family Medicine

## 2019-10-09 ENCOUNTER — Ambulatory Visit: Payer: Medicare Other | Admitting: Endocrinology

## 2019-10-09 VITALS — BP 124/58 | HR 75 | Temp 97.5°F | Ht 72.0 in | Wt 274.8 lb

## 2019-10-09 DIAGNOSIS — H539 Unspecified visual disturbance: Secondary | ICD-10-CM

## 2019-10-09 DIAGNOSIS — E559 Vitamin D deficiency, unspecified: Secondary | ICD-10-CM | POA: Diagnosis not present

## 2019-10-09 DIAGNOSIS — N1831 Chronic kidney disease, stage 3a: Secondary | ICD-10-CM | POA: Insufficient documentation

## 2019-10-09 DIAGNOSIS — E538 Deficiency of other specified B group vitamins: Secondary | ICD-10-CM

## 2019-10-09 DIAGNOSIS — E7800 Pure hypercholesterolemia, unspecified: Secondary | ICD-10-CM

## 2019-10-09 DIAGNOSIS — R7989 Other specified abnormal findings of blood chemistry: Secondary | ICD-10-CM | POA: Diagnosis not present

## 2019-10-09 DIAGNOSIS — B356 Tinea cruris: Secondary | ICD-10-CM

## 2019-10-09 DIAGNOSIS — R269 Unspecified abnormalities of gait and mobility: Secondary | ICD-10-CM | POA: Diagnosis not present

## 2019-10-09 LAB — CBC
HCT: 47.8 % (ref 39.0–52.0)
Hemoglobin: 15.9 g/dL (ref 13.0–17.0)
MCHC: 33.2 g/dL (ref 30.0–36.0)
MCV: 92.7 fl (ref 78.0–100.0)
Platelets: 121 10*3/uL — ABNORMAL LOW (ref 150.0–400.0)
RBC: 5.16 Mil/uL (ref 4.22–5.81)
RDW: 14.8 % (ref 11.5–15.5)
WBC: 4.8 10*3/uL (ref 4.0–10.5)

## 2019-10-09 LAB — COMPREHENSIVE METABOLIC PANEL
ALT: 18 U/L (ref 0–53)
AST: 15 U/L (ref 0–37)
Albumin: 4.1 g/dL (ref 3.5–5.2)
Alkaline Phosphatase: 85 U/L (ref 39–117)
BUN: 22 mg/dL (ref 6–23)
CO2: 31 mEq/L (ref 19–32)
Calcium: 9.2 mg/dL (ref 8.4–10.5)
Chloride: 101 mEq/L (ref 96–112)
Creatinine, Ser: 1.5 mg/dL (ref 0.40–1.50)
GFR: 45.27 mL/min — ABNORMAL LOW (ref >60.00)
Glucose, Bld: 160 mg/dL — ABNORMAL HIGH (ref 70–99)
Potassium: 3.6 mEq/L (ref 3.5–5.1)
Sodium: 140 mEq/L (ref 135–145)
Total Bilirubin: 0.6 mg/dL (ref 0.2–1.2)
Total Protein: 6.4 g/dL (ref 6.0–8.3)

## 2019-10-09 LAB — LDL CHOLESTEROL, DIRECT: Direct LDL: 79 mg/dL

## 2019-10-09 LAB — TSH: TSH: 2.99 u[IU]/mL (ref 0.35–4.50)

## 2019-10-09 LAB — FOLATE: Folate: 13.7 ng/mL (ref >5.9)

## 2019-10-09 LAB — VITAMIN D 25 HYDROXY (VIT D DEFICIENCY, FRACTURES): VITD: 31.25 ng/mL (ref 30.00–100.00)

## 2019-10-09 NOTE — Progress Notes (Addendum)
Established Patient Office Visit  Subjective:  Patient ID: [name], male    DOB: 1941/10/22  Age: 78 y.o. MRN: [id number]  CC:  Chief Complaint  Patient presents with  . 3 month follow up     HPI [name] presents for follow-up of multiple issues.  Fortunately he has had no further falls.  Is seem to help when physical medicine decreased his Elavil from 50 mg down to 25 mg.  Continues to supplement with vitamin D and high-dose.  He has been taking folic acid for years because he has been told that would help his vision.  He would like for me to recheck his groin rash.  He has been using Lotrimin cream.  Chart review shows elevated TSH in 2019.  Past Medical History:  Diagnosis Date  . Allergy   . Anal fissure   . Anemia, unspecified   . Arthritis    Spinal OA  . BACK PAIN, LUMBAR 10/23/2007  . CARDIAC MURMUR, SYSTOLIC 4/0/0867  . DIABETES MELLITUS, TYPE I 05/20/2007  . DIASTOLIC DYSFUNCTION 04/10/5092  . 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[place])   . Duodenitis   . Edema 05/12/2008  . Esophageal stricture   . GERD (gastroesophageal reflux disease)   . 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 Gynecomastia   . Hepatic steatosis   . HYPERLIPIDEMIA 05/20/2007  . Hypertension   . Hypogonadism male   . Morbid obesity ([place]) 09/10/2009  . OBSTRUCTIVE SLEEP APNEA 11/04/2007  . Personal history of colonic polyps   . RENAL INSUFFICIENCY 05/12/2008  . Sleep apnea    uses cpap  . Urolithiasis     Past Surgical History:  Procedure Laterality Date  . Abdominal US  09/1997  . arm fracture Left 1958   with hardware  . Colon cancer screening    . COLONOSCOPY  08/18/2004   diverticulitis  . COLONOSCOPY  09/24/2009  . ELECTROCARDIOGRAM  02/2006  . FLEXIBLE SIGMOIDOSCOPY  03/04/2001   polyps, anal fissure  . GANGLION CYST EXCISION Left 1980  . Lower Arterial  04/13/2004  . POLYPECTOMY    . Rest Cardiolite  03/19/2003    Family History  Problem Relation Age of Onset  . Colon cancer Brother 30   . Colon polyps Brother   . Cancer Brother        lung cancer, deceased  . Other Mother        MVA, deceased 63s  . Healthy Daughter   . Healthy Son   . Rectal cancer Neg Hx   . Stomach cancer Neg Hx     Social History   Socioeconomic History  . Marital status: Married    Spouse name: Not on file  . Number of children: Not on file  . Years of education: Not on file  . Highest education level: Not on file  Occupational History    Employer: [employer]  Tobacco Use  . Smoking status: Former Smoker    Packs/day: 2.00    Years: 31.00    Pack years: 62.00    Types: Cigarettes    Quit date: 10/23/1982    Years since quitting: 37.0  . Smokeless tobacco: Never Used  Substance and Sexual Activity  . Alcohol use: No    Alcohol/week: 0.0 standard drinks  . Drug use: No  . Sexual activity: Not on file  Other Topics Concern  . Not on file  Social History Narrative   Lives with wife in a one story home.  Has  a son and a daughter.     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     Education: 2 years of college.   Social Determinants of Health   Financial Resource Strain:   . Difficulty of Paying Living Expenses: Not on file  Food Insecurity:   . Worried About Charity fundraiser in the Last Year: Not on file  . Ran Out of Food in the Last Year: Not on file  Transportation Needs:   . Lack of Transportation (Medical): Not on file  . Lack of Transportation (Non-Medical): Not on file  Physical Activity:   . Days of Exercise per Week: Not on file  . Minutes of Exercise per Session: Not on file  Stress:   . Feeling of Stress : Not on file  Social Connections:   . Frequency of Communication with Friends and Family: Not on file  . Frequency of Social Gatherings with Friends and Family: Not on file  . Attends Religious Services: Not on file  . Active Member of Clubs or Organizations: Not on file  . Attends Archivist Meetings: Not on file  . Marital  Status: Not on file  Intimate Partner Violence:   . Fear of Current or Ex-Partner: Not on file  . Emotionally Abused: Not on file  . Physically Abused: Not on file  . Sexually Abused: Not on file    Outpatient Medications Prior to Visit  Medication Sig Dispense Refill  . Accu-Chek FastClix Lancets MISC USE TO CHECK BLOOD SUGAR UP TO 6 TIMES DAILY 612 each 3  . alfuzosin (UROXATRAL) 10 MG 24 hr tablet Take 1 tablet (10 mg total) by mouth daily with breakfast. 90 tablet 2  . allopurinol (ZYLOPRIM) 300 MG tablet TAKE 1 TABLET BY MOUTH  DAILY 90 tablet 3  . aspirin (BAYER LOW STRENGTH) 81 MG EC tablet Take 81 mg by mouth daily.      . Blood Glucose Monitoring Suppl (ACCU-CHEK AVIVA PLUS) w/Device KIT Use as directed 1 kit 0  . carvedilol (COREG) 3.125 MG tablet TAKE 1/2 TABLETS BY MOUTH 2 TIMES DAILY WITH MEALS. 90 tablet 2  . Continuous Blood Gluc Sensor (DEXCOM G6 SENSOR) MISC Inject 1 Device into the skin as directed. Apply to skin SQ every 10 days 9 each 3  . cycloSPORINE (RESTASIS) 0.05 % ophthalmic emulsion Place 1 drop into both eyes 2 (two) times daily.      . diclofenac Sodium (VOLTAREN) 1 % GEL Apply 2 g topically 4 (four) times daily. 350 g 1  . DULoxetine (CYMBALTA) 60 MG capsule TAKE 1 CAPSULE BY MOUTH  DAILY 30 capsule 3  . fluticasone (FLONASE) 50 MCG/ACT nasal spray Place 1 spray into the nose daily as needed for allergies.     . folic acid (FOLVITE) 1 MG tablet Take 2 mg by mouth 2 (two) times daily. 4 tabs daily    . furosemide (LASIX) 20 MG tablet Take 1 tablet (20 mg total) by mouth daily. 90 tablet 2  . glucose blood (ACCU-CHEK AVIVA PLUS) test strip USE TO TEST BLOOD SUGAR 6  TIMES PER DAY; E11.9 600 each 11  . Insulin Infusion Pump Supplies (AUTOSOFT XC INFUSION SET) MISC Inject 1 Act into the skin every other day. Use with 72m cannula and 23 inch tubing. *5 boxes (90 day supply) 5 each 3  . Insulin Infusion Pump Supplies (T:SLIM INSULIN CARTRIDGE 3ML) MISC Use with  insulin pump, fill once every 2 days. 5  each 3  . insulin lispro (HUMALOG) 100 UNIT/ML injection Inject 1.1 mLs (110 Units total) into the skin daily. 100 mL 0  . mometasone (ELOCON) 0.1 % lotion APPLY TOPICALLY DAILY 60 mL 11  . Multiple Vitamins-Minerals (CENTRUM SILVER PO) Take 1 tablet by mouth daily.      . Omega-3 Fatty Acids (FISH OIL) 1200 MG CAPS Take 1,200 mg by mouth daily.     Marland Kitchen omeprazole (PRILOSEC) 20 MG capsule Take 1 capsule (20 mg total) by mouth daily as needed. 90 capsule 2  . rosuvastatin (CRESTOR) 40 MG tablet Take 1 tablet (40 mg total) by mouth at bedtime. 90 tablet 2  . vitamin B-12 (CYANOCOBALAMIN) 1000 MCG tablet Take 1,000 mcg by mouth daily.    Marland Kitchen amitriptyline (ELAVIL) 25 MG tablet Take 25 mg by mouth at bedtime.    . Vitamin D, Ergocalciferol, (DRISDOL) 1.25 MG (50000 UT) CAPS capsule Take 1 capsule (50,000 Units total) by mouth every 7 (seven) days. 5 capsule 6  . amitriptyline (ELAVIL) 50 MG tablet Take 1 tablet (50 mg total) by mouth at bedtime. (Patient not taking: Reported on 10/09/2019) 30 tablet 2  . meloxicam (MOBIC) 7.5 MG tablet Take 1 tablet (7.5 mg total) by mouth 2 (two) times daily as needed for pain. (Patient not taking: Reported on 10/09/2019) 30 tablet 2   No facility-administered medications prior to visit.    Allergies  Allergen Reactions  . Atorvastatin Other (See Comments)    unknown  . Niacin     REACTION: Severe heartburn  . Pioglitazone     REACTION: Edema    ROS Review of Systems  Constitutional: Negative.   HENT: Negative.   Eyes: Negative for photophobia and visual disturbance.  Respiratory: Negative.   Cardiovascular: Negative.   Gastrointestinal: Negative.   Endocrine: Negative for polyphagia and polyuria.  Genitourinary: Negative.   Musculoskeletal: Negative for gait problem and joint swelling.  Allergic/Immunologic: Negative for immunocompromised state.  Neurological: Negative for dizziness and light-headedness.    Hematological: Does not bruise/bleed easily.  Psychiatric/Behavioral: Negative.       Objective:    Physical Exam  Constitutional: He is oriented to person, place, and time. He appears well-developed and well-nourished. No distress.  HENT:  Head: Normocephalic and atraumatic.  Right Ear: External ear normal.  Left Ear: External ear normal.  Eyes: Conjunctivae are normal. Right eye exhibits no discharge. Left eye exhibits no discharge. No scleral icterus.  Neck: No JVD present. No tracheal deviation present. No thyromegaly present.  Cardiovascular: Normal rate, regular rhythm and normal heart sounds.  Pulmonary/Chest: Effort normal and breath sounds normal. No stridor.  Genitourinary:    Right testis shows no swelling. Right testis is descended. Left testis shows no swelling. Left testis is descended. Circumcised.  Lymphadenopathy:    He has no cervical adenopathy.  Neurological: He is alert and oriented to person, place, and time.  Skin: Skin is warm and dry. He is not diaphoretic.  Psychiatric: He has a normal mood and affect. His behavior is normal.    BP (!) 124/58   Pulse 75   Temp (!) 97.5 F (36.4 C)   Ht 6' (1.829 m)   Wt 274 lb 12.8 oz (124.6 kg)   SpO2 94%   BMI 37.27 kg/m  Wt Readings from Last 3 Encounters:  10/09/19 274 lb 12.8 oz (124.6 kg)  09/15/19 276 lb 3.2 oz (125.3 kg)  08/18/19 285 lb (129.3 kg)     Health Maintenance Due  Topic Date Due  . OPHTHALMOLOGY EXAM  06/20/2019    There are no preventive care reminders to display for this patient.  Lab Results  Component Value Date   TSH 2.99 10/09/2019   Lab Results  Component Value Date   WBC 4.8 10/09/2019   HGB 15.9 10/09/2019   HCT 47.8 10/09/2019   MCV 92.7 10/09/2019   PLT 121.0 (L) 10/09/2019   Lab Results  Component Value Date   NA 140 10/09/2019   K 3.6 10/09/2019   CO2 31 10/09/2019   GLUCOSE 160 (H) 10/09/2019   BUN 22 10/09/2019   CREATININE 1.50 10/09/2019   BILITOT  0.6 10/09/2019   ALKPHOS 85 10/09/2019   AST 15 10/09/2019   ALT 18 10/09/2019   PROT 6.4 10/09/2019   ALBUMIN 4.1 10/09/2019   CALCIUM 9.2 10/09/2019   ANIONGAP 8 11/28/2014   GFR 45.27 (L) 10/09/2019   Lab Results  Component Value Date   CHOL 116 07/10/2019   Lab Results  Component Value Date   HDL 32.20 (L) 07/10/2019   Lab Results  Component Value Date   LDLCALC 57 07/10/2019   Lab Results  Component Value Date   TRIG 137.0 07/10/2019   Lab Results  Component Value Date   CHOLHDL 4 07/10/2019   Lab Results  Component Value Date   HGBA1C 6.2 (A) 08/13/2019      Assessment & Plan:   Problem List Items Addressed This Visit      Musculoskeletal and Integument   Tinea cruris     Genitourinary   Stage 3a chronic kidney disease   Relevant Orders   CBC (Completed)   Comp Met (CMET) (Completed)     Other   Vitamin D deficiency   Relevant Medications   Vitamin D, Ergocalciferol, (DRISDOL) 1.25 MG (50000 UT) CAPS capsule   Other Relevant Orders   VITAMIN D 25 Hydroxy (Vit-D Deficiency, Fractures) (Completed)   Gait disturbance - Primary   Relevant Orders   CBC (Completed)   Elevated LDL cholesterol level   Relevant Orders   Direct LDL (Completed)   Folic acid deficiency   Relevant Orders   Folate (Completed)   Elevated TSH   Relevant Orders   TSH (Completed)   Transient visual disturbance   Relevant Orders   VAS US CAROTID      Meds ordered this encounter  Medications  . Vitamin D, Ergocalciferol, (DRISDOL) 1.25 MG (50000 UT) CAPS capsule    Sig: Take 1 capsule (50,000 Units total) by mouth every 7 (seven) days.    Dispense:  5 capsule    Refill:  6    Follow-up: Return in about 3 months (around 01/07/2020).    Libby Maw, MD

## 2019-10-10 MED ORDER — VITAMIN D (ERGOCALCIFEROL) 1.25 MG (50000 UNIT) PO CAPS: 50000.0000 [IU] | ORAL_CAPSULE | ORAL | 6 refills | Status: DC

## 2019-10-10 NOTE — Addendum Note (Signed)
Addended by: Jon Billings on: 10/10/2019 10:24 AM   Modules accepted: Orders

## 2019-10-14 ENCOUNTER — Other Ambulatory Visit: Payer: Self-pay | Admitting: *Deleted

## 2019-10-14 MED ORDER — AMITRIPTYLINE HCL 50 MG PO TABS: 50.0000 mg | ORAL_TABLET | Freq: Every day | ORAL | 0 refills | Status: DC

## 2019-10-20 DIAGNOSIS — H539 Unspecified visual disturbance: Secondary | ICD-10-CM | POA: Insufficient documentation

## 2019-10-20 NOTE — Addendum Note (Signed)
Addended by: Jon Billings on: 10/20/2019 12:34 PM   Modules accepted: Orders

## 2019-10-22 ENCOUNTER — Ambulatory Visit (HOSPITAL_BASED_OUTPATIENT_CLINIC_OR_DEPARTMENT_OTHER)
Admission: RE | Admit: 2019-10-22 | Discharge: 2019-10-22 | Disposition: A | Discharge status: Home / Self Care | Payer: Medicare Other | Source: Ambulatory Visit | Attending: Family Medicine | Admitting: Family Medicine

## 2019-10-22 ENCOUNTER — Other Ambulatory Visit: Payer: Self-pay

## 2019-10-22 DIAGNOSIS — H539 Unspecified visual disturbance: Secondary | ICD-10-CM | POA: Diagnosis not present

## 2019-10-22 NOTE — Progress Notes (Signed)
Bilateral carotid duplex performed   10/22/19 Cardell Peach

## 2019-10-27 ENCOUNTER — Encounter: Payer: Self-pay | Admitting: Family Medicine

## 2019-11-04 ENCOUNTER — Ambulatory Visit: Payer: Medicare Other | Attending: Internal Medicine

## 2019-11-04 DIAGNOSIS — Z23 Encounter for immunization: Secondary | ICD-10-CM | POA: Insufficient documentation

## 2019-11-04 NOTE — Progress Notes (Signed)
   Covid-19 Vaccination Clinic  Name:  [name]    MRN: [id number] DOB: 1941-09-06  11/04/2019  [name] was observed post Covid-19 immunization for 15 minutes without incidence. He was provided with Vaccine Information Sheet and instruction to access the V-Safe system.   [name] was instructed to call 911 with any severe reactions post vaccine: Marland Kitchen Difficulty breathing  . Swelling of your face and throat  . A fast heartbeat  . A bad rash all over your body  . Dizziness and weakness    Immunizations Administered    Name Date Dose VIS Date Route   Pfizer COVID-19 Vaccine 11/04/2019  8:51 AM 0.3 mL 10/03/2019 Intramuscular   Manufacturer: [place]   Lot: [id number]   [place]: [id number]

## 2019-11-07 ENCOUNTER — Other Ambulatory Visit: Payer: Self-pay | Admitting: Family Medicine

## 2019-11-07 ENCOUNTER — Other Ambulatory Visit: Payer: Self-pay | Admitting: Endocrinology

## 2019-11-07 DIAGNOSIS — N401 Enlarged prostate with lower urinary tract symptoms: Secondary | ICD-10-CM

## 2019-11-07 DIAGNOSIS — R3912 Poor urinary stream: Secondary | ICD-10-CM

## 2019-11-11 ENCOUNTER — Other Ambulatory Visit: Payer: Self-pay

## 2019-11-13 ENCOUNTER — Encounter: Payer: Self-pay | Admitting: Endocrinology

## 2019-11-13 ENCOUNTER — Other Ambulatory Visit: Payer: Self-pay

## 2019-11-13 ENCOUNTER — Ambulatory Visit (INDEPENDENT_AMBULATORY_CARE_PROVIDER_SITE_OTHER): Payer: Medicare Other | Admitting: Endocrinology

## 2019-11-13 VITALS — BP 120/60 | HR 90 | Ht 72.0 in | Wt 272.4 lb

## 2019-11-13 DIAGNOSIS — E1129 Type 2 diabetes mellitus with other diabetic kidney complication: Secondary | ICD-10-CM | POA: Diagnosis not present

## 2019-11-13 DIAGNOSIS — Z794 Long term (current) use of insulin: Secondary | ICD-10-CM

## 2019-11-13 DIAGNOSIS — E1159 Type 2 diabetes mellitus with other circulatory complications: Secondary | ICD-10-CM

## 2019-11-13 DIAGNOSIS — E0800 Diabetes mellitus due to underlying condition with hyperosmolarity without nonketotic hyperglycemic-hyperosmolar coma (NKHHC): Secondary | ICD-10-CM

## 2019-11-13 LAB — POCT GLYCOSYLATED HEMOGLOBIN (HGB A1C): Hemoglobin A1C: 6.2 % — AB (ref 4.0–5.6)

## 2019-11-13 NOTE — Patient Instructions (Addendum)
Please take these pump settings:  basal rate of 2.3 units/hr, 24 hrs per day.   mealtime bolus of 1 unit/2 grams carbohydrate.  However, subtract 14 units from your calculated lunch bolus.  continue correction bolus (which some people call "sensitivity," or "insulin sensitivity ratio," or just "isr") of 1 unit for each 10 by which your glucose exceeds 100.   Please come back for a follow-up appointment in 3 months.

## 2019-11-13 NOTE — Progress Notes (Signed)
Subjective:    Patient ID: [name], male    DOB: 01-09-1941, 79 y.o.   MRN: [id number]  HPI Pt returns for f/u of diabetes mellitus: DM type: Insulin-requiring type 2 Dx'ed: 7209 Complications: polyneuropathy, CAD, and renal insufficiency.  Therapy: insulin since 1995.  DKA: never.  Severe hypoglycemia: never.  Pancreatitis: never.  Other: he started pump therapy (now T-slim, since mid-2015), and dexcom G-6 continuous glucose monitor.   Interval history: he takes these pump settings:   basal rate of 2.4 units/hr, 24 hrs per day.   mealtime bolus of 1 unit/2 grams carbohydrate.  However, subtract 14 units from your calculated lunch bolus.  continue correction bolus (which some people call "sensitivity," or "insulin sensitivity ratio," or just "isr") of 1 unit for each 10 by which your glucose exceeds 100.   TDD is approx 77 units (67% of which is basal).   I reviewed continuous glucose monitor data.  Glucose varies from 90-200 (but pt says cbg is as low as 40).  It is in general lowest fasting, but there is not much trend throughout the day.   pt states he feels well in general. Past Medical History:  Diagnosis Date  . Allergy   . Anal fissure   . Anemia, unspecified   . Arthritis    Spinal OA  . BACK PAIN, LUMBAR 10/23/2007  . CARDIAC MURMUR, SYSTOLIC 01/27/961  . DIABETES MELLITUS, TYPE I 05/20/2007  . DIASTOLIC DYSFUNCTION 8/36/6294  . 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[place])   . Duodenitis   . Edema 05/12/2008  . Esophageal stricture   . GERD (gastroesophageal reflux disease)   . 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 Gynecomastia   . Hepatic steatosis   . HYPERLIPIDEMIA 05/20/2007  . Hypertension   . Hypogonadism male   . Morbid obesity ([place]) 09/10/2009  . OBSTRUCTIVE SLEEP APNEA 11/04/2007  . Personal history of colonic polyps   . RENAL INSUFFICIENCY 05/12/2008  . Sleep apnea    uses cpap  . Urolithiasis     Past Surgical History:  Procedure Laterality Date  .  Abdominal US  09/1997  . arm fracture Left 1958   with hardware  . Colon cancer screening    . COLONOSCOPY  08/18/2004   diverticulitis  . COLONOSCOPY  09/24/2009  . ELECTROCARDIOGRAM  02/2006  . FLEXIBLE SIGMOIDOSCOPY  03/04/2001   polyps, anal fissure  . GANGLION CYST EXCISION Left 1980  . Lower Arterial  04/13/2004  . POLYPECTOMY    . Rest Cardiolite  03/19/2003    Social History   Socioeconomic History  . Marital status: Married    Spouse name: Not on file  . Number of children: Not on file  . Years of education: Not on file  . Highest education level: Not on file  Occupational History    Employer: [employer]  Tobacco Use  . Smoking status: Former Smoker    Packs/day: 2.00    Years: 31.00    Pack years: 62.00    Types: Cigarettes    Quit date: 10/23/1982    Years since quitting: 37.0  . Smokeless tobacco: Never Used  Substance and Sexual Activity  . Alcohol use: No    Alcohol/week: 0.0 standard drinks  . Drug use: No  . Sexual activity: Not on file  Other Topics Concern  . Not on file  Social History Narrative   Lives with wife in a one story home.  Has a son and a daughter.  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     Education: 2 years of college.   Social Determinants of Health   Financial Resource Strain:   . Difficulty of Paying Living Expenses: Not on file  Food Insecurity:   . Worried About Charity fundraiser in the Last Year: Not on file  . Ran Out of Food in the Last Year: Not on file  Transportation Needs:   . Lack of Transportation (Medical): Not on file  . Lack of Transportation (Non-Medical): Not on file  Physical Activity:   . Days of Exercise per Week: Not on file  . Minutes of Exercise per Session: Not on file  Stress:   . Feeling of Stress : Not on file  Social Connections:   . Frequency of Communication with Friends and Family: Not on file  . Frequency of Social Gatherings with Friends and Family: Not on file    . Attends Religious Services: Not on file  . Active Member of Clubs or Organizations: Not on file  . Attends Archivist Meetings: Not on file  . Marital Status: Not on file  Intimate Partner Violence:   . Fear of Current or Ex-Partner: Not on file  . Emotionally Abused: Not on file  . Physically Abused: Not on file  . Sexually Abused: Not on file    Current Outpatient Medications on File Prior to Visit  Medication Sig Dispense Refill  . Accu-Chek FastClix Lancets MISC USE TO CHECK BLOOD SUGAR UP TO 6 TIMES DAILY 612 each 3  . alfuzosin (UROXATRAL) 10 MG 24 hr tablet TAKE 1 TABLET BY MOUTH  DAILY WITH BREAKFAST 90 tablet 3  . allopurinol (ZYLOPRIM) 300 MG tablet TAKE 1 TABLET BY MOUTH  DAILY 90 tablet 3  . amitriptyline (ELAVIL) 50 MG tablet Take 1 tablet (50 mg total) by mouth at bedtime. 90 tablet 0  . aspirin (BAYER LOW STRENGTH) 81 MG EC tablet Take 81 mg by mouth daily.      . Blood Glucose Monitoring Suppl (ACCU-CHEK AVIVA PLUS) w/Device KIT Use as directed 1 kit 0  . carvedilol (COREG) 3.125 MG tablet TAKE 1/2 TABLETS BY MOUTH 2 TIMES DAILY WITH MEALS. 90 tablet 2  . Continuous Blood Gluc Sensor (DEXCOM G6 SENSOR) MISC Inject 1 Device into the skin as directed. Apply to skin SQ every 10 days 9 each 3  . cycloSPORINE (RESTASIS) 0.05 % ophthalmic emulsion Place 1 drop into both eyes 2 (two) times daily.      . diclofenac Sodium (VOLTAREN) 1 % GEL Apply 2 g topically 4 (four) times daily. 350 g 1  . DULoxetine (CYMBALTA) 60 MG capsule TAKE 1 CAPSULE BY MOUTH  DAILY 30 capsule 3  . fluticasone (FLONASE) 50 MCG/ACT nasal spray Place 1 spray into the nose daily as needed for allergies.     . folic acid (FOLVITE) 1 MG tablet Take 2 mg by mouth 2 (two) times daily. 4 tabs daily    . furosemide (LASIX) 20 MG tablet Take 1 tablet (20 mg total) by mouth daily. 90 tablet 2  . glucose blood (ACCU-CHEK AVIVA PLUS) test strip USE TO TEST BLOOD SUGAR 6  TIMES PER DAY; E11.9 600 each 11   . HUMALOG 100 UNIT/ML injection INJECT SUBCUTANEOUSLY 110  UNITS DAILY 100 mL 3  . Insulin Infusion Pump Supplies (AUTOSOFT XC INFUSION SET) MISC Inject 1 Act into the skin every other day. Use with 64m cannula and 23 inch tubing. *5  boxes (90 day supply) 5 each 3  . Insulin Infusion Pump Supplies (T:SLIM INSULIN CARTRIDGE 3ML) MISC Use with insulin pump, fill once every 2 days. 5 each 3  . mometasone (ELOCON) 0.1 % lotion APPLY TOPICALLY DAILY 60 mL 11  . Multiple Vitamins-Minerals (CENTRUM SILVER PO) Take 1 tablet by mouth daily.      . Omega-3 Fatty Acids (FISH OIL) 1200 MG CAPS Take 1,200 mg by mouth daily.     Marland Kitchen omeprazole (PRILOSEC) 20 MG capsule Take 1 capsule (20 mg total) by mouth daily as needed. 90 capsule 2  . rosuvastatin (CRESTOR) 40 MG tablet Take 1 tablet (40 mg total) by mouth at bedtime. 90 tablet 2  . vitamin B-12 (CYANOCOBALAMIN) 1000 MCG tablet Take 1,000 mcg by mouth daily.    . Vitamin D, Ergocalciferol, (DRISDOL) 1.25 MG (50000 UT) CAPS capsule Take 1 capsule (50,000 Units total) by mouth every 7 (seven) days. 5 capsule 6   No current facility-administered medications on file prior to visit.    Allergies  Allergen Reactions  . Atorvastatin Other (See Comments)    unknown  . Niacin     REACTION: Severe heartburn  . Pioglitazone     REACTION: Edema    Family History  Problem Relation Age of Onset  . Colon cancer Brother 37  . Colon polyps Brother   . Cancer Brother        lung cancer, deceased  . Other Mother        MVA, deceased 35s  . Healthy Daughter   . Healthy Son   . Rectal cancer Neg Hx   . Stomach cancer Neg Hx     BP 120/60 (BP Location: Left Arm, Patient Position: Sitting, Cuff Size: Large)   Pulse 90   Ht 6' (1.829 m)   Wt 272 lb 6.4 oz (123.6 kg)   SpO2 98%   BMI 36.94 kg/m    Review of Systems He denies LOC.      Objective:   Physical Exam VITAL SIGNS:  See vs page GENERAL: no distress Pulses: dorsalis pedis intact bilat.    MSK: no deformity of the feet CV: no leg edema Skin:  no ulcer on the feet.  normal color and temp on the feet. Neuro: sensation is intact to touch on the feet.     Lab Results  Component Value Date   HGBA1C 6.2 (A) 11/13/2019       Assessment & Plan:  Insulin-requiring type 2 DM, with CAD: overcontrolled Hypoglycemia: this limits aggressiveness of glycemic control Renal insuff: in this setting, we need to move most of his insulin to boluses.   Patient Instructions  Please take these pump settings:  basal rate of 2.3 units/hr, 24 hrs per day.   mealtime bolus of 1 unit/2 grams carbohydrate.  However, subtract 14 units from your calculated lunch bolus.  continue correction bolus (which some people call "sensitivity," or "insulin sensitivity ratio," or just "isr") of 1 unit for each 10 by which your glucose exceeds 100.   Please come back for a follow-up appointment in 3 months.

## 2019-11-24 ENCOUNTER — Ambulatory Visit: Payer: Medicare Other | Attending: Internal Medicine

## 2019-11-24 DIAGNOSIS — Z23 Encounter for immunization: Secondary | ICD-10-CM | POA: Insufficient documentation

## 2019-11-24 NOTE — Progress Notes (Signed)
   Covid-19 Vaccination Clinic  Name:  [name]    MRN: [id number] DOB: 01/08/1941  11/24/2019  [name] was observed post Covid-19 immunization for 15 minutes without incidence. He was provided with Vaccine Information Sheet and instruction to access the V-Safe system.   [name] was instructed to call 911 with any severe reactions post vaccine: Marland Kitchen Difficulty breathing  . Swelling of your face and throat  . A fast heartbeat  . A bad rash all over your body  . Dizziness and weakness    Immunizations Administered    Name Date Dose VIS Date Route   Pfizer COVID-19 Vaccine 11/24/2019  8:27 AM 0.3 mL 10/03/2019 Intramuscular   Manufacturer: [place]   Lot: [id number]   [place]: [id number]

## 2019-12-09 ENCOUNTER — Encounter: Payer: Self-pay | Admitting: Family Medicine

## 2019-12-16 ENCOUNTER — Encounter: Payer: Medicare Other | Attending: Physical Medicine & Rehabilitation | Admitting: Physical Medicine & Rehabilitation

## 2019-12-16 ENCOUNTER — Encounter: Payer: Self-pay | Admitting: Physical Medicine & Rehabilitation

## 2019-12-16 ENCOUNTER — Other Ambulatory Visit: Payer: Self-pay

## 2019-12-16 VITALS — Ht 72.0 in | Wt 272.0 lb

## 2019-12-16 DIAGNOSIS — M791 Myalgia, unspecified site: Secondary | ICD-10-CM | POA: Insufficient documentation

## 2019-12-16 DIAGNOSIS — R269 Unspecified abnormalities of gait and mobility: Secondary | ICD-10-CM

## 2019-12-16 DIAGNOSIS — M545 Low back pain: Secondary | ICD-10-CM

## 2019-12-16 DIAGNOSIS — G8929 Other chronic pain: Secondary | ICD-10-CM

## 2019-12-16 DIAGNOSIS — E1142 Type 2 diabetes mellitus with diabetic polyneuropathy: Secondary | ICD-10-CM

## 2019-12-16 DIAGNOSIS — M792 Neuralgia and neuritis, unspecified: Secondary | ICD-10-CM

## 2019-12-16 MED ORDER — AMITRIPTYLINE HCL 25 MG PO TABS: 25.0000 mg | ORAL_TABLET | Freq: Every day | ORAL | 1 refills | Status: DC

## 2019-12-16 NOTE — Progress Notes (Signed)
Subjective:    Patient ID: [name], male    DOB: Sep 15, 1941, 79 y.o.   MRN: [id number]  TELEHEALTH NOTE  Due to national recommendations of social distancing due to [place] 19, an audio/video telehealth visit is felt to be most appropriate for this patient at this time.  See Chart message from today for the patient's consent to telehealth from [place].     I verified that I am speaking with the correct person using two identifiers.  Location of patient: Home Location of provider: Office Method of communication: Webex Names of participants : Zorita Pang scheduling, Jasmine December obtaining consent and vitals if available Established patient  HPI  Male with pmh of OSA, CKD, HTN, DM type 1 with peripheral neuropathy presents for follow up of low back pain > neck pain.   On first visit, pt complained of b/l L>R back pain.  Started ~>20 years ago.  No inciting event.  Sitting/laying improve the pain.  Standing exacerbates the pain.  Sharp, burning pain.  Radiates to left posterior thigh.  Intermittent.  He has associated numbness/tingling.  He only tried ASA, which helps some.  Pain limits from doing ADLs and fishing.  He denies falls.  Last clinic visit on 09/15/2019.  Since that time, he is seldom using TENS unit. He is using less Baclofen now after using cream.  Denies falls. He had side effects with increased dose of Elavil and is doing better at the lower dose.  Good benefit with Voltaren gel.  He denies weight loss. Patient notes a nonhealing ulcer on ankle.  States he has an appointment with his PCP soon, encouraged follow up, especially given hx of DM.   Pain Inventory Average Pain 4 Pain Right Now 3 My pain is sharp  In the last 24 hours, has pain interfered with the following? General activity 3 Relation with others 3 Enjoyment of life 3 What TIME of day is your pain at its worst? daytime Sleep (in general) Good  Pain is worse with:  walking, standing and some activites Pain improves with: rest, heat/ice and TENS Relief from Meds: na  Mobility walk with assistance use a cane ability to climb steps?  yes do you drive?  yes  Function retired  Neuro/Psych weakness numbness tingling  Prior Studies Any changes since last visit?  yes COVID vaccine both dose  Physicians involved in your care Any changes since last visit?  no   Family History  Problem Relation Age of Onset  . Colon cancer Brother 64  . Colon polyps Brother   . Cancer Brother        lung cancer, deceased  . Other Mother        MVA, deceased 89s  . Healthy Daughter   . Healthy Son   . Rectal cancer Neg Hx   . Stomach cancer Neg Hx    Social History   Socioeconomic History  . Marital status: Married    Spouse name: Not on file  . Number of children: Not on file  . Years of education: Not on file  . Highest education level: Not on file  Occupational History    Employer: [employer]  Tobacco Use  . Smoking status: Former Smoker    Packs/day: 2.00    Years: 31.00    Pack years: 62.00    Types: Cigarettes    Quit date: 10/23/1982    Years since quitting: 37.1  . Smokeless tobacco: Never Used  Substance and Sexual Activity  . Alcohol use: No    Alcohol/week: 0.0 standard drinks  . Drug use: No  . Sexual activity: Not on file  Other Topics Concern  . Not on file  Social History Narrative   Lives with wife in a one story home.  Has a son and a daughter.     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     Education: 2 years of college.   Social Determinants of Health   Financial Resource Strain:   . Difficulty of Paying Living Expenses: Not on file  Food Insecurity:   . Worried About Charity fundraiser in the Last Year: Not on file  . Ran Out of Food in the Last Year: Not on file  Transportation Needs:   . Lack of Transportation (Medical): Not on file  . Lack of Transportation (Non-Medical): Not on file   Physical Activity:   . Days of Exercise per Week: Not on file  . Minutes of Exercise per Session: Not on file  Stress:   . Feeling of Stress : Not on file  Social Connections:   . Frequency of Communication with Friends and Family: Not on file  . Frequency of Social Gatherings with Friends and Family: Not on file  . Attends Religious Services: Not on file  . Active Member of Clubs or Organizations: Not on file  . Attends Archivist Meetings: Not on file  . Marital Status: Not on file   Past Surgical History:  Procedure Laterality Date  . Abdominal US  09/1997  . arm fracture Left 1958   with hardware  . Colon cancer screening    . COLONOSCOPY  08/18/2004   diverticulitis  . COLONOSCOPY  09/24/2009  . ELECTROCARDIOGRAM  02/2006  . FLEXIBLE SIGMOIDOSCOPY  03/04/2001   polyps, anal fissure  . GANGLION CYST EXCISION Left 1980  . Lower Arterial  04/13/2004  . POLYPECTOMY    . Rest Cardiolite  03/19/2003   Past Medical History:  Diagnosis Date  . Allergy   . Anal fissure   . Anemia, unspecified   . Arthritis    Spinal OA  . BACK PAIN, LUMBAR 10/23/2007  . CARDIAC MURMUR, SYSTOLIC 06/23/7914  . DIABETES MELLITUS, TYPE I 05/20/2007  . DIASTOLIC DYSFUNCTION 0/56/9794  . 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[place])   . Duodenitis   . Edema 05/12/2008  . Esophageal stricture   . GERD (gastroesophageal reflux disease)   . 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 Gynecomastia   . Hepatic steatosis   . HYPERLIPIDEMIA 05/20/2007  . Hypertension   . Hypogonadism male   . Morbid obesity ([place]) 09/10/2009  . OBSTRUCTIVE SLEEP APNEA 11/04/2007  . Personal history of colonic polyps   . RENAL INSUFFICIENCY 05/12/2008  . Sleep apnea    uses cpap  . Urolithiasis    Ht 6' (1.829 m) Comment: pt reported virtual visit  Wt 272 lb (123.4 kg) Comment: pt reported virtual visit  BMI 36.89 kg/m   Opioid Risk Score:   Fall Risk Score:  `1  Depression screen PHQ 2/9  Depression screen [facility] 2/9  09/18/2018 09/20/2017 08/31/2016  Decreased Interest 0 0 0  Down, Depressed, Hopeless 0 0 0  PHQ - 2 Score 0 0 0  Altered sleeping - - 0  Tired, decreased energy - - 1  Change in appetite - - 0  Feeling bad or failure about yourself  - - 0  Trouble concentrating - -  0  Moving slowly or fidgety/restless - - 0  Suicidal thoughts - - 0  PHQ-9 Score - - 1  Difficult doing work/chores - - Somewhat difficult  Some recent data might be hidden    Review of Systems  HENT: Negative.   Eyes: Negative.   Respiratory: Positive for apnea and shortness of breath.   Cardiovascular: Negative.   Gastrointestinal: Negative.   Endocrine:       Diabetic High blood sugar  Genitourinary: Negative.           Musculoskeletal: Positive for arthralgias, back pain, myalgias and neck pain.  Skin: Negative.   Allergic/Immunologic: Negative.   Neurological: Positive for weakness and numbness.       Tingling  Hematological: Negative.       Objective:   Physical Exam  Constitutional: No distress . Respiratory: Normal effort.  Psych: Normal mood.  Normal behavior. Neuro: Alert             Strength          5/5 in all LE myotomes    Assessment & Plan:  Male with pmh of OSA, CKD, HTN, DM type 1 with peripheral neuropathy presents for follow up of low back pain > neck pain.    1. Chronic mechanical low back pain             MRIs C,L-spine early 2016 suggesting multilevel spondylosis with mild b/l multilevel foraminal narrowing C4-C7 and shallow disc bulge at L5-S1 without central canal or foraminal stenosis.             Avoid NSAIDs due to CKD             Unable to tolerate Gabapentin, Lyrica             Cont HEP             Cont TENs             Aquatic therapy, not going anymore, believes he can just live with the pain and that he is lazy             Cont tylenol             Cont Robaxin 500 TID PRN, changed to Baclofen 10 TID PRN due to change insurance, continue              Cont Cymbalta  60mg              Cont back brace during periods of excessive activity, reminded to avoid prolonged use             Cont Lidoderm OTC             Encouraged rest breaks  Acknowledges he can do more if he make a conscious effort             PT completed  Cont Voltaren gel - good benefit  2. Sacroiliitis             See #1             Cont brace             Not main pain generator at present, does not wish to proceed with injection at this time, particularly due to DM  3. Sleep disturbance  Recently new mattress  Cont CPAP  Improved  4. Myalgia             Good benefit with trigger point injections for a short  period             No need to repeat at present  5. Diabetic neuropathy             See #1, #3  Side effects with elavil to 50mg  qhs, cont 25mg  educated on signs/symptoms of seratonin syndrome previously  6. Morbid obesity             Not interested in seeing dietitian at this time             No weight loss  7. Gait abnormality             Completed therapies             Cont cane for safety  8. Nonhealing ulcer  Discussed appropriate monitoring and follow regarding further management

## 2019-12-29 ENCOUNTER — Telehealth: Payer: Self-pay

## 2019-12-29 NOTE — Telephone Encounter (Signed)
FAXED [place]: [place]  Document: Rx for diabetic supplies (infusion catheter, syringe with needles and supplies for CGM) Other records requested: None requested  All above requested information has been faxed successfully to [facility] listed above. Documents and fax confirmation have been placed in the faxed file for future reference.

## 2019-12-30 NOTE — Progress Notes (Signed)
Nurse connected with patient 12/31/19 at 11:00 AM EST by a telephone enabled telemedicine application and verified that I am speaking with the correct person using two identifiers. Patient stated full name and DOB. Patient gave permission to continue with virtual visit. Patient's location was at home and Nurse's location was at [place] office.   Subjective:   [name] is a 79 y.o. male who presents for Medicare Annual/Subsequent preventive examination.  Review of Systems: No ROS.  Medicare Wellness Virtual Visit.  Visual/audio telehealth visit, UTA vital signs.   See social history for additional risk factors.  Home Safety/Smoke Alarms: Feels safe in home. Smoke alarms in place.  Lives w/ wife and pt's brother. 1 story home. Uses cane.   Male:   CCS-  05/05/15.  Recall 5 yrs. PSA-  Lab Results  Component Value Date   PSA 3.08 05/09/2018   PSA 1.97 03/31/2016   PSA 2.27 07/07/2014       Objective:    Vitals: Unable to assess. This visit is enabled though telemedicine due to Covid 19.   Advanced Directives 12/31/2019 05/05/2019 07/03/2017 06/20/2017 04/19/2017 03/30/2017 03/15/2017  Does Patient Have a Medical Advance Directive? No No No Yes Yes Yes Yes  Type of Advance Directive - - Public librarian;Living will [place];Living will [place];Living will [place];Living will  Does patient want to make changes to medical advance directive? - - - - - - -  Copy of [place] in Chart? - - - No - copy requested - - -  Would patient like information on creating a medical advance directive? No - Patient declined No - Patient declined - - - - -    Tobacco Social History   Tobacco Use  Smoking Status Former Smoker  . Packs/day: 2.00  . Years: 31.00  . Pack years: 62.00  . Types: Cigarettes  . Quit date: 10/23/1982  . Years since quitting: 37.2  Smokeless Tobacco Never Used     Counseling  given: Not Answered   Clinical Intake: Pain : No/denies pain    Past Medical History:  Diagnosis Date  . Allergy   . Anal fissure   . Anemia, unspecified   . Arthritis    Spinal OA  . BACK PAIN, LUMBAR 10/23/2007  . CARDIAC MURMUR, SYSTOLIC 2/0/3559  . DIABETES MELLITUS, TYPE I 05/20/2007  . DIASTOLIC DYSFUNCTION 7/41/6384  . 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 Duodenitis   . Edema 05/12/2008  . Esophageal stricture   . GERD (gastroesophageal reflux disease)   . 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 Gynecomastia   . Hepatic steatosis   . HYPERLIPIDEMIA 05/20/2007  . Hypertension   . Hypogonadism male   . Morbid obesity ([place]) 09/10/2009  . OBSTRUCTIVE SLEEP APNEA 11/04/2007  . Personal history of colonic polyps   . RENAL INSUFFICIENCY 05/12/2008  . Sleep apnea    uses cpap  . Urolithiasis    Past Surgical History:  Procedure Laterality Date  . Abdominal US  09/1997  . arm fracture Left 1958   with hardware  . Colon cancer screening    . COLONOSCOPY  08/18/2004   diverticulitis  . COLONOSCOPY  09/24/2009  . ELECTROCARDIOGRAM  02/2006  . FLEXIBLE SIGMOIDOSCOPY  03/04/2001   polyps, anal fissure  . GANGLION CYST EXCISION Left 1980  . Lower Arterial  04/13/2004  . POLYPECTOMY    . Rest Cardiolite  03/19/2003  Family History  Problem Relation Age of Onset  . Colon cancer Brother 65  . Colon polyps Brother   . Cancer Brother        lung cancer, deceased  . Other Mother        MVA, deceased 40s  . Healthy Daughter   . Healthy Son   . Rectal cancer Neg Hx   . Stomach cancer Neg Hx    Social History   Socioeconomic History  . Marital status: Married    Spouse name: Not on file  . Number of children: Not on file  . Years of education: Not on file  . Highest education level: Not on file  Occupational History    Employer: [employer]  Tobacco Use  . Smoking status: Former Smoker    Packs/day: 2.00    Years: 31.00    Pack years: 62.00    Types:  Cigarettes    Quit date: 10/23/1982    Years since quitting: 37.2  . Smokeless tobacco: Never Used  Substance and Sexual Activity  . Alcohol use: No    Alcohol/week: 0.0 standard drinks  . Drug use: No  . Sexual activity: Not on file  Other Topics Concern  . Not on file  Social History Narrative   Lives with wife in a one story home.  Has a son and a daughter.     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     Education: 2 years of college.   Social Determinants of Health   Financial Resource Strain: Low Risk   . Difficulty of Paying Living Expenses: Not hard at all  Food Insecurity: No Food Insecurity  . Worried About Charity fundraiser in the Last Year: Never true  . Ran Out of Food in the Last Year: Never true  Transportation Needs: No Transportation Needs  . Lack of Transportation (Medical): No  . Lack of Transportation (Non-Medical): No  Physical Activity:   . Days of Exercise per Week: Not on file  . Minutes of Exercise per Session: Not on file  Stress:   . Feeling of Stress : Not on file  Social Connections:   . Frequency of Communication with Friends and Family: Not on file  . Frequency of Social Gatherings with Friends and Family: Not on file  . Attends Religious Services: Not on file  . Active Member of Clubs or Organizations: Not on file  . Attends Archivist Meetings: Not on file  . Marital Status: Not on file    Outpatient Encounter Medications as of 12/31/2019  Medication Sig  . Accu-Chek FastClix Lancets MISC USE TO CHECK BLOOD SUGAR UP TO 6 TIMES DAILY  . alfuzosin (UROXATRAL) 10 MG 24 hr tablet TAKE 1 TABLET BY MOUTH  DAILY WITH BREAKFAST  . allopurinol (ZYLOPRIM) 300 MG tablet TAKE 1 TABLET BY MOUTH  DAILY  . amitriptyline (ELAVIL) 25 MG tablet Take 1 tablet (25 mg total) by mouth at bedtime.  Marland Kitchen aspirin (BAYER LOW STRENGTH) 81 MG EC tablet Take 81 mg by mouth daily.    . Blood Glucose Monitoring Suppl (ACCU-CHEK AVIVA PLUS)  w/Device KIT Use as directed  . carvedilol (COREG) 3.125 MG tablet TAKE 1/2 TABLETS BY MOUTH 2 TIMES DAILY WITH MEALS.  Marland Kitchen Continuous Blood Gluc Sensor (DEXCOM G6 SENSOR) MISC Inject 1 Device into the skin as directed. Apply to skin SQ every 10 days  . cycloSPORINE (RESTASIS) 0.05 % ophthalmic emulsion Place 1 drop into both  eyes 2 (two) times daily.    . diclofenac Sodium (VOLTAREN) 1 % GEL Apply 2 g topically 4 (four) times daily.  . DULoxetine (CYMBALTA) 60 MG capsule TAKE 1 CAPSULE BY MOUTH  DAILY  . fluticasone (FLONASE) 50 MCG/ACT nasal spray Place 1 spray into the nose daily as needed for allergies.   . folic acid (FOLVITE) 1 MG tablet Take 2 mg by mouth 2 (two) times daily. 4 tabs daily  . furosemide (LASIX) 20 MG tablet Take 1 tablet (20 mg total) by mouth daily.  Marland Kitchen glucose blood (ACCU-CHEK AVIVA PLUS) test strip USE TO TEST BLOOD SUGAR 6  TIMES PER DAY; E11.9  . HUMALOG 100 UNIT/ML injection INJECT SUBCUTANEOUSLY 110  UNITS DAILY  . Insulin Infusion Pump Supplies (AUTOSOFT XC INFUSION SET) MISC Inject 1 Act into the skin every other day. Use with 40m cannula and 23 inch tubing. *5 boxes (90 day supply)  . Insulin Infusion Pump Supplies (T:SLIM INSULIN CARTRIDGE 3ML) MISC Use with insulin pump, fill once every 2 days.  . mometasone (ELOCON) 0.1 % lotion APPLY TOPICALLY DAILY  . Multiple Vitamins-Minerals (CENTRUM SILVER PO) Take 1 tablet by mouth daily.    . Omega-3 Fatty Acids (FISH OIL) 1200 MG CAPS Take 1,200 mg by mouth daily.   .Marland Kitchenomeprazole (PRILOSEC) 20 MG capsule Take 1 capsule (20 mg total) by mouth daily as needed.  . rosuvastatin (CRESTOR) 40 MG tablet Take 1 tablet (40 mg total) by mouth at bedtime.  . vitamin B-12 (CYANOCOBALAMIN) 1000 MCG tablet Take 1,000 mcg by mouth daily.  . Vitamin D, Ergocalciferol, (DRISDOL) 1.25 MG (50000 UT) CAPS capsule Take 1 capsule (50,000 Units total) by mouth every 7 (seven) days.   No facility-administered encounter medications on file as of  12/31/2019.    Activities of Daily Living In your present state of health, do you have any difficulty performing the following activities: 12/31/2019  Hearing? Y  Vision? N  Difficulty concentrating or making decisions? N  Walking or climbing stairs? N  Dressing or bathing? N  Doing errands, shopping? N  Preparing Food and eating ? N  Using the Toilet? N  In the past six months, have you accidently leaked urine? N  Do you have problems with loss of bowel control? N  Managing your Medications? N  Managing your Finances? N  Housekeeping or managing your Housekeeping? N  Some recent data might be hidden    Patient Care Team: KLibby Maw MD as PCP - General (Family Medicine) PAlda Berthold DO as Consulting Physician (Neurology) [place], KArmando Reichert MD as Consulting Physician (Pulmonary Disease) KInda Castle MD (Inactive) as Consulting Physician (Gastroenterology) MStephannie Li [place](Ophthalmology)   Assessment:   This is a routine wellness examination for [place] Physical assessment deferred to PCP.  Exercise Activities and Dietary recommendations Current Exercise Habits: The patient does not participate in regular exercise at present, Exercise limited by: None identified   Diet (meal preparation, eat out, water intake, caffeinated beverages, dairy products, fruits and vegetables): well balanced   Goals    . Increase physical activity       Fall Risk Fall Risk  12/31/2019 12/16/2019 08/18/2019 05/01/2019 09/18/2018  Falls in the past year? 0 0 1 1 0  Comment - - - - -  Number falls in past yr: 0 - 0 1 -  Comment - - - last fall June 2020 -  Injury with Fall? 0 - 0 - -  Comment - - - - -  Risk for fall due to : - - Impaired balance/gait;Impaired mobility;Impaired vision - -  Risk for fall due to: Comment - - - - -  Follow up Education provided;Falls prevention discussed - Falls prevention discussed - -    Depression Screen PHQ 2/9 Scores 12/31/2019 09/18/2018  09/20/2017 08/31/2016  PHQ - 2 Score 0 0 0 0  PHQ- 9 Score - - - 1    Cognitive Function   Ad8 score reviewed for issues:  Issues making decisions:no  Less interest in hobbies / activities:no  Repeats questions, stories (family complaining):no  Trouble using ordinary gadgets (microwave, computer, phone):no  Forgets the month or year: no  Mismanaging finances: no  Remembering appts:no  Daily problems with thinking and/or memory:no Ad8 score is=0          Immunization History  Administered Date(s) Administered  . Fluad Quad(high Dose 65+) 07/10/2019  . Influenza Whole 07/23/2009, 06/23/2010  . Influenza, High Dose Seasonal PF 07/03/2017, 07/30/2018  . Influenza,inj,Quad PF,6+ Mos 09/26/2013, 07/07/2014, 10/01/2015, 06/30/2016  . PFIZER SARS-COV-2 Vaccination 11/04/2019, 11/24/2019  . Pneumococcal Conjugate-13 11/25/2014  . Pneumococcal Polysaccharide-23 02/21/1995, 03/20/2013  . Td 02/20/1998, 10/27/2008  . Zoster 07/07/2014   Screening Tests Health Maintenance  Topic Date Due  . OPHTHALMOLOGY EXAM  06/20/2019  . FOOT EXAM  12/25/2019  . TETANUS/TDAP  09/20/2020 (Originally 10/27/2018)  . COLONOSCOPY  05/04/2020  . HEMOGLOBIN A1C  05/12/2020  . URINE MICROALBUMIN  07/09/2020  . INFLUENZA VACCINE  Completed  . PNA vac Low Risk Adult  Completed     Plan:    Please schedule your next medicare wellness visit with me in 1 yr.  Continue to eat heart healthy diet (full of fruits, vegetables, whole grains, lean protein, water--limit salt, fat, and sugar intake) and increase physical activity as tolerated.  Continue doing brain stimulating activities (puzzles, reading, adult coloring books, staying active) to keep memory sharp.     I have personally reviewed and noted the following in the patient's chart:   . Medical and social history . Use of alcohol, tobacco or illicit drugs  . Current medications and supplements . Functional ability and status . Nutritional  status . Physical activity . Advanced directives . List of other physicians . Hospitalizations, surgeries, and ER visits in previous 12 months . Vitals . Screenings to include cognitive, depression, and falls . Referrals and appointments  In addition, I have reviewed and discussed with patient certain preventive protocols, quality metrics, and best practice recommendations. A written personalized care plan for preventive services as well as general preventive health recommendations were provided to patient.     Shela Nevin, [place]  12/31/2019

## 2019-12-31 ENCOUNTER — Ambulatory Visit (INDEPENDENT_AMBULATORY_CARE_PROVIDER_SITE_OTHER): Payer: Medicare Other | Admitting: *Deleted

## 2019-12-31 ENCOUNTER — Encounter: Payer: Self-pay | Admitting: *Deleted

## 2019-12-31 DIAGNOSIS — Z0000 Encounter for general adult medical examination without abnormal findings: Secondary | ICD-10-CM | POA: Diagnosis not present

## 2019-12-31 NOTE — Patient Instructions (Signed)
Please schedule your next medicare wellness visit with me in 1 yr.  Continue to eat heart healthy diet (full of fruits, vegetables, whole grains, lean protein, water--limit salt, fat, and sugar intake) and increase physical activity as tolerated.  Continue doing brain stimulating activities (puzzles, reading, adult coloring books, staying active) to keep memory sharp.    [name] , Thank you for taking time to come for your Medicare Wellness Visit. I appreciate your ongoing commitment to your health goals. Please review the following plan we discussed and let me know if I can assist you in the future.   These are the goals we discussed: Goals    . Increase physical activity       This is a list of the screening recommended for you and due dates:  Health Maintenance  Topic Date Due  . Eye exam for diabetics  06/20/2019  . Complete foot exam   12/25/2019  . Tetanus Vaccine  09/20/2020*  . Colon Cancer Screening  05/04/2020  . Hemoglobin A1C  05/12/2020  . Urine Protein Check  07/09/2020  . Flu Shot  Completed  . Pneumonia vaccines  Completed  *Topic was postponed. The date shown is not the original due date.    Preventive Care 65 Years and Older, Male Preventive care refers to lifestyle choices and visits with your health care provider that can promote health and wellness. This includes:  A yearly physical exam. This is also called an annual well check.  Regular dental and eye exams.  Immunizations.  Screening for certain conditions.  Healthy lifestyle choices, such as diet and exercise. What can I expect for my preventive care visit? Physical exam Your health care provider will check:  Height and weight. These may be used to calculate body mass index (BMI), which is a measurement that tells if you are at a healthy weight.  Heart rate and blood pressure.  Your skin for abnormal spots. Counseling Your health care provider may ask you questions about:  Alcohol,  tobacco, and drug use.  Emotional well-being.  Home and relationship well-being.  Sexual activity.  Eating habits.  History of falls.  Memory and ability to understand (cognition).  Work and work environment. What immunizations do I need?  Influenza (flu) vaccine  This is recommended every year. Tetanus, diphtheria, and pertussis (Tdap) vaccine  You may need a Td booster every 10 years. Varicella (chickenpox) vaccine  You may need this vaccine if you have not already been vaccinated. Zoster (shingles) vaccine  You may need this after age 60. Pneumococcal conjugate (PCV13) vaccine  One dose is recommended after age 79. Pneumococcal polysaccharide (PPSV23) vaccine  One dose is recommended after age 79. Measles, mumps, and rubella (MMR) vaccine  You may need at least one dose of MMR if you were born in 1957 or later. You may also need a second dose. Meningococcal conjugate (MenACWY) vaccine  You may need this if you have certain conditions. Hepatitis A vaccine  You may need this if you have certain conditions or if you travel or work in places where you may be exposed to hepatitis A. Hepatitis B vaccine  You may need this if you have certain conditions or if you travel or work in places where you may be exposed to hepatitis B. Haemophilus influenzae type b (Hib) vaccine  You may need this if you have certain conditions. You may receive vaccines as individual doses or as more than one vaccine together in one shot (combination vaccines).   Talk with your health care provider about the risks and benefits of combination vaccines. What tests do I need? Blood tests  Lipid and cholesterol levels. These may be checked every 5 years, or more frequently depending on your overall health.  Hepatitis C test.  Hepatitis B test. Screening  Lung cancer screening. You may have this screening every year starting at age 55 if you have a 30-pack-year history of smoking and  currently smoke or have quit within the past 15 years.  Colorectal cancer screening. All adults should have this screening starting at age 50 and continuing until age 75. Your health care provider may recommend screening at age 45 if you are at increased risk. You will have tests every 1-10 years, depending on your results and the type of screening test.  Prostate cancer screening. Recommendations will vary depending on your family history and other risks.  Diabetes screening. This is done by checking your blood sugar (glucose) after you have not eaten for a while (fasting). You may have this done every 1-3 years.  Abdominal aortic aneurysm (AAA) screening. You may need this if you are a current or former smoker.  Sexually transmitted disease (STD) testing. Follow these instructions at home: Eating and drinking  Eat a diet that includes fresh fruits and vegetables, whole grains, lean protein, and low-fat dairy products. Limit your intake of foods with high amounts of sugar, saturated fats, and salt.  Take vitamin and mineral supplements as recommended by your health care provider.  Do not drink alcohol if your health care provider tells you not to drink.  If you drink alcohol: ? Limit how much you have to 0-2 drinks a day. ? Be aware of how much alcohol is in your drink. In the [place] one drink equals one 12 oz bottle of beer (355 mL), one 5 oz glass of wine (148 mL), or one 1 oz glass of hard liquor (44 mL). Lifestyle  Take daily care of your teeth and gums.  Stay active. Exercise for at least 30 minutes on 5 or more days each week.  Do not use any products that contain nicotine or tobacco, such as cigarettes, e-cigarettes, and chewing tobacco. If you need help quitting, ask your health care provider.  If you are sexually active, practice safe sex. Use a condom or other form of protection to prevent STIs (sexually transmitted infections).  Talk with your health care provider about  taking a low-dose aspirin or statin. What's next?  Visit your health care provider once a year for a well check visit.  Ask your health care provider how often you should have your eyes and teeth checked.  Stay up to date on all vaccines. This information is not intended to replace advice given to you by your health care provider. Make sure you discuss any questions you have with your health care provider. Document Revised: 10/03/2018 Document Reviewed: 10/03/2018 Elsevier Patient Education  2020 [employer]  

## 2020-01-07 ENCOUNTER — Other Ambulatory Visit: Payer: Self-pay

## 2020-01-08 ENCOUNTER — Encounter: Payer: Self-pay | Admitting: Family Medicine

## 2020-01-08 ENCOUNTER — Ambulatory Visit (INDEPENDENT_AMBULATORY_CARE_PROVIDER_SITE_OTHER): Payer: Medicare Other | Admitting: Family Medicine

## 2020-01-08 VITALS — BP 136/70 | HR 67 | Temp 96.7°F | Ht 72.0 in | Wt 279.2 lb

## 2020-01-08 DIAGNOSIS — I10 Essential (primary) hypertension: Secondary | ICD-10-CM

## 2020-01-08 DIAGNOSIS — N1831 Chronic kidney disease, stage 3a: Secondary | ICD-10-CM | POA: Diagnosis not present

## 2020-01-08 DIAGNOSIS — E559 Vitamin D deficiency, unspecified: Secondary | ICD-10-CM

## 2020-01-08 DIAGNOSIS — Z8739 Personal history of other diseases of the musculoskeletal system and connective tissue: Secondary | ICD-10-CM

## 2020-01-08 DIAGNOSIS — L989 Disorder of the skin and subcutaneous tissue, unspecified: Secondary | ICD-10-CM

## 2020-01-08 LAB — BASIC METABOLIC PANEL
BUN: 18 mg/dL (ref 6–23)
CO2: 35 mEq/L — ABNORMAL HIGH (ref 19–32)
Calcium: 9.2 mg/dL (ref 8.4–10.5)
Chloride: 104 mEq/L (ref 96–112)
Creatinine, Ser: 1.38 mg/dL (ref 0.40–1.50)
GFR: 49.81 mL/min — ABNORMAL LOW (ref >60.00)
Glucose, Bld: 125 mg/dL — ABNORMAL HIGH (ref 70–99)
Potassium: 4.3 mEq/L (ref 3.5–5.1)
Sodium: 141 mEq/L (ref 135–145)

## 2020-01-08 LAB — CBC
HCT: 44.6 % (ref 39.0–52.0)
Hemoglobin: 14.9 g/dL (ref 13.0–17.0)
MCHC: 33.3 g/dL (ref 30.0–36.0)
MCV: 91.7 fl (ref 78.0–100.0)
Platelets: 123 10*3/uL — ABNORMAL LOW (ref 150.0–400.0)
RBC: 4.86 Mil/uL (ref 4.22–5.81)
RDW: 15.6 % — ABNORMAL HIGH (ref 11.5–15.5)
WBC: 4.4 10*3/uL (ref 4.0–10.5)

## 2020-01-08 LAB — VITAMIN D 25 HYDROXY (VIT D DEFICIENCY, FRACTURES): VITD: 43.04 ng/mL (ref 30.00–100.00)

## 2020-01-08 LAB — URIC ACID: Uric Acid, Serum: 5.1 mg/dL (ref 4.0–7.8)

## 2020-01-08 NOTE — Progress Notes (Signed)
Established Patient Office Visit  Subjective:  Patient ID: [name], male    DOB: 04-Jul-1941  Age: 79 y.o. MRN: [id number]  CC:  Chief Complaint  Patient presents with  . Follow-up    3 month follow up on labs, pt would like sore on left ankle checked.     HPI [name] presents for follow-up of his hypertension that is been well controlled with carvedilol.  Continues to take allopurinol for gout.  Has had no recent attacks.  No longer taking oral old hearing but has been using Voltaren gel on various aches and pains.  It is helped to a certain extent.  He has a lesion on his left lower leg that came up after an abrasion back in October.  It is persisting.  He thinks that it may be getting's smaller but is not sure.  It does crack to bleed at times.  Diabetes has been under good control.  Past Medical History:  Diagnosis Date  . Allergy   . Anal fissure   . Anemia, unspecified   . Arthritis    Spinal OA  . BACK PAIN, LUMBAR 10/23/2007  . CARDIAC MURMUR, SYSTOLIC 06/25/4267  . DIABETES MELLITUS, TYPE I 05/20/2007  . DIASTOLIC DYSFUNCTION 3/41/9622  . 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 Duodenitis   . Edema 05/12/2008  . Esophageal stricture   . GERD (gastroesophageal reflux disease)   . 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 Gynecomastia   . Hepatic steatosis   . HYPERLIPIDEMIA 05/20/2007  . Hypertension   . Hypogonadism male   . Morbid obesity ([place]) 09/10/2009  . OBSTRUCTIVE SLEEP APNEA 11/04/2007  . Personal history of colonic polyps   . RENAL INSUFFICIENCY 05/12/2008  . Sleep apnea    uses cpap  . Urolithiasis     Past Surgical History:  Procedure Laterality Date  . Abdominal US  09/1997  . arm fracture Left 1958   with hardware  . Colon cancer screening    . COLONOSCOPY  08/18/2004   diverticulitis  . COLONOSCOPY  09/24/2009  . ELECTROCARDIOGRAM  02/2006  . FLEXIBLE SIGMOIDOSCOPY  03/04/2001   polyps, anal fissure  . GANGLION CYST EXCISION Left 1980  . Lower  Arterial  04/13/2004  . POLYPECTOMY    . Rest Cardiolite  03/19/2003    Family History  Problem Relation Age of Onset  . Colon cancer Brother 55  . Colon polyps Brother   . Cancer Brother        lung cancer, deceased  . Other Mother        MVA, deceased 59s  . Healthy Daughter   . Healthy Son   . Rectal cancer Neg Hx   . Stomach cancer Neg Hx     Social History   Socioeconomic History  . Marital status: Married    Spouse name: Not on file  . Number of children: Not on file  . Years of education: Not on file  . Highest education level: Not on file  Occupational History    Employer: [employer]  Tobacco Use  . Smoking status: Former Smoker    Packs/day: 2.00    Years: 31.00    Pack years: 62.00    Types: Cigarettes    Quit date: 10/23/1982    Years since quitting: 37.2  . Smokeless tobacco: Never Used  Substance and Sexual Activity  . Alcohol use: No    Alcohol/week: 0.0 standard drinks  . Drug use: No  .  Sexual activity: Not on file  Other Topics Concern  . Not on file  Social History Narrative   Lives with wife in a one story home.  Has a son and a daughter.     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     Education: 2 years of college.   Social Determinants of Health   Financial Resource Strain: Low Risk   . Difficulty of Paying Living Expenses: Not hard at all  Food Insecurity: No Food Insecurity  . Worried About Charity fundraiser in the Last Year: Never true  . Ran Out of Food in the Last Year: Never true  Transportation Needs: No Transportation Needs  . Lack of Transportation (Medical): No  . Lack of Transportation (Non-Medical): No  Physical Activity:   . Days of Exercise per Week:   . Minutes of Exercise per Session:   Stress:   . Feeling of Stress :   Social Connections:   . Frequency of Communication with Friends and Family:   . Frequency of Social Gatherings with Friends and Family:   . Attends Religious Services:   .  Active Member of Clubs or Organizations:   . Attends Archivist Meetings:   Marland Kitchen Marital Status:   Intimate Partner Violence:   . Fear of Current or Ex-Partner:   . Emotionally Abused:   Marland Kitchen Physically Abused:   . Sexually Abused:     Outpatient Medications Prior to Visit  Medication Sig Dispense Refill  . Accu-Chek FastClix Lancets MISC USE TO CHECK BLOOD SUGAR UP TO 6 TIMES DAILY 612 each 3  . alfuzosin (UROXATRAL) 10 MG 24 hr tablet TAKE 1 TABLET BY MOUTH  DAILY WITH BREAKFAST 90 tablet 3  . allopurinol (ZYLOPRIM) 300 MG tablet TAKE 1 TABLET BY MOUTH  DAILY 90 tablet 3  . amitriptyline (ELAVIL) 25 MG tablet Take 1 tablet (25 mg total) by mouth at bedtime. 30 tablet 1  . aspirin (BAYER LOW STRENGTH) 81 MG EC tablet Take 81 mg by mouth daily.      . Blood Glucose Monitoring Suppl (ACCU-CHEK AVIVA PLUS) w/Device KIT Use as directed 1 kit 0  . carvedilol (COREG) 3.125 MG tablet TAKE 1/2 TABLETS BY MOUTH 2 TIMES DAILY WITH MEALS. 90 tablet 2  . Continuous Blood Gluc Sensor (DEXCOM G6 SENSOR) MISC Inject 1 Device into the skin as directed. Apply to skin SQ every 10 days 9 each 3  . cycloSPORINE (RESTASIS) 0.05 % ophthalmic emulsion Place 1 drop into both eyes 2 (two) times daily.      . diclofenac Sodium (VOLTAREN) 1 % GEL Apply 2 g topically 4 (four) times daily. 350 g 1  . DULoxetine (CYMBALTA) 60 MG capsule TAKE 1 CAPSULE BY MOUTH  DAILY 30 capsule 3  . fluticasone (FLONASE) 50 MCG/ACT nasal spray Place 1 spray into the nose daily as needed for allergies.     . folic acid (FOLVITE) 1 MG tablet Take 2 mg by mouth 2 (two) times daily. 4 tabs daily    . furosemide (LASIX) 20 MG tablet Take 1 tablet (20 mg total) by mouth daily. 90 tablet 2  . glucose blood (ACCU-CHEK AVIVA PLUS) test strip USE TO TEST BLOOD SUGAR 6  TIMES PER DAY; E11.9 600 each 11  . HUMALOG 100 UNIT/ML injection INJECT SUBCUTANEOUSLY 110  UNITS DAILY 100 mL 3  . Insulin Infusion Pump Supplies (AUTOSOFT XC INFUSION  SET) MISC Inject 1 Act into the skin every other  day. Use with 22m cannula and 23 inch tubing. *5 boxes (90 day supply) 5 each 3  . Insulin Infusion Pump Supplies (T:SLIM INSULIN CARTRIDGE 3ML) MISC Use with insulin pump, fill once every 2 days. 5 each 3  . mometasone (ELOCON) 0.1 % lotion APPLY TOPICALLY DAILY 60 mL 11  . Multiple Vitamins-Minerals (CENTRUM SILVER PO) Take 1 tablet by mouth daily.      . Omega-3 Fatty Acids (FISH OIL) 1200 MG CAPS Take 1,200 mg by mouth daily.     .Marland Kitchenomeprazole (PRILOSEC) 20 MG capsule Take 1 capsule (20 mg total) by mouth daily as needed. 90 capsule 2  . rosuvastatin (CRESTOR) 40 MG tablet Take 1 tablet (40 mg total) by mouth at bedtime. 90 tablet 2  . vitamin B-12 (CYANOCOBALAMIN) 1000 MCG tablet Take 1,000 mcg by mouth daily.    . Vitamin D, Ergocalciferol, (DRISDOL) 1.25 MG (50000 UT) CAPS capsule Take 1 capsule (50,000 Units total) by mouth every 7 (seven) days. 5 capsule 6   No facility-administered medications prior to visit.    Allergies  Allergen Reactions  . Atorvastatin Other (See Comments)    unknown  . Niacin     REACTION: Severe heartburn  . Pioglitazone     REACTION: Edema    ROS Review of Systems  Constitutional: Negative.   HENT: Negative.   Respiratory: Negative.   Cardiovascular: Negative.   Gastrointestinal: Negative.   Genitourinary: Negative.   Musculoskeletal: Positive for arthralgias, back pain and neck pain.  Allergic/Immunologic: Negative for immunocompromised state.  Neurological: Positive for tremors. Negative for light-headedness.  Hematological: Does not bruise/bleed easily.  Psychiatric/Behavioral: Negative.       Objective:    Physical Exam  Constitutional: He is oriented to person, place, and time. He appears well-developed and well-nourished. No distress.  HENT:  Head: Normocephalic and atraumatic.  Right Ear: External ear normal.  Left Ear: External ear normal.  Eyes: Conjunctivae are normal. Right eye  exhibits no discharge. Left eye exhibits no discharge. No scleral icterus.  Neck: No JVD present. No tracheal deviation present. No thyromegaly present.  Cardiovascular: Normal rate, regular rhythm and normal heart sounds.  Pulmonary/Chest: Effort normal and breath sounds normal. No stridor.  Abdominal: Bowel sounds are normal.  Musculoskeletal:        General: No edema.     Cervical back: Neck supple.  Lymphadenopathy:    He has no cervical adenopathy.  Neurological: He is alert and oriented to person, place, and time.  Skin: Skin is dry. He is not diaphoretic.     Psychiatric: He has a normal mood and affect. His behavior is normal.    BP 136/70   Pulse 67   Temp (!) 96.7 F (35.9 C) (Tympanic)   Ht 6' (1.829 m)   Wt 279 lb 3.2 oz (126.6 kg)   SpO2 97%   BMI 37.87 kg/m  Wt Readings from Last 3 Encounters:  01/08/20 279 lb 3.2 oz (126.6 kg)  12/16/19 272 lb (123.4 kg)  11/13/19 272 lb 6.4 oz (123.6 kg)     Health Maintenance Due  Topic Date Due  . OPHTHALMOLOGY EXAM  06/20/2019  . FOOT EXAM  12/25/2019    There are no preventive care reminders to display for this patient.  Lab Results  Component Value Date   TSH 2.99 10/09/2019   Lab Results  Component Value Date   WBC 4.8 10/09/2019   HGB 15.9 10/09/2019   HCT 47.8 10/09/2019   MCV 92.7  10/09/2019   PLT 121.0 (L) 10/09/2019   Lab Results  Component Value Date   NA 140 10/09/2019   K 3.6 10/09/2019   CO2 31 10/09/2019   GLUCOSE 160 (H) 10/09/2019   BUN 22 10/09/2019   CREATININE 1.50 10/09/2019   BILITOT 0.6 10/09/2019   ALKPHOS 85 10/09/2019   AST 15 10/09/2019   ALT 18 10/09/2019   PROT 6.4 10/09/2019   ALBUMIN 4.1 10/09/2019   CALCIUM 9.2 10/09/2019   ANIONGAP 8 11/28/2014   GFR 45.27 (L) 10/09/2019   Lab Results  Component Value Date   CHOL 116 07/10/2019   Lab Results  Component Value Date   HDL 32.20 (L) 07/10/2019   Lab Results  Component Value Date   LDLCALC 57 07/10/2019    Lab Results  Component Value Date   TRIG 137.0 07/10/2019   Lab Results  Component Value Date   CHOLHDL 4 07/10/2019   Lab Results  Component Value Date   HGBA1C 6.2 (A) 11/13/2019      Assessment & Plan:   Problem List Items Addressed This Visit      Cardiovascular and Mediastinum   Essential hypertension   Relevant Orders   CBC   Basic metabolic panel     Musculoskeletal and Integument   Skin lesion of left leg   Relevant Orders   Ambulatory referral to Dermatology     Genitourinary   Stage 3a chronic kidney disease   Relevant Orders   CBC   Basic metabolic panel     Other   Vitamin D deficiency - Primary   Relevant Orders   VITAMIN D 25 Hydroxy (Vit-D Deficiency, Fractures)   History of gout   Relevant Orders   Uric acid      No orders of the defined types were placed in this encounter.   Follow-up: Return in about 3 months (around 04/09/2020).   We did discuss using stronger pain medicines if needed. Libby Maw, MD

## 2020-02-08 ENCOUNTER — Other Ambulatory Visit: Payer: Self-pay | Admitting: Physical Medicine & Rehabilitation

## 2020-02-10 ENCOUNTER — Other Ambulatory Visit: Payer: Self-pay

## 2020-02-11 ENCOUNTER — Ambulatory Visit (INDEPENDENT_AMBULATORY_CARE_PROVIDER_SITE_OTHER): Payer: Medicare Other | Admitting: Endocrinology

## 2020-02-11 ENCOUNTER — Telehealth: Payer: Self-pay

## 2020-02-11 ENCOUNTER — Encounter: Payer: Self-pay | Admitting: Endocrinology

## 2020-02-11 VITALS — BP 120/62 | HR 76 | Ht 72.0 in | Wt 280.0 lb

## 2020-02-11 DIAGNOSIS — E0800 Diabetes mellitus due to underlying condition with hyperosmolarity without nonketotic hyperglycemic-hyperosmolar coma (NKHHC): Secondary | ICD-10-CM

## 2020-02-11 LAB — POCT GLYCOSYLATED HEMOGLOBIN (HGB A1C): Hemoglobin A1C: 6.8 % — AB (ref 4.0–5.6)

## 2020-02-11 NOTE — Progress Notes (Signed)
Subjective:    Patient ID: [name], male    DOB: 02-Nov-1940, 79 y.o.   MRN: [id number]  HPI Pt returns for f/u of diabetes mellitus: DM type: Insulin-requiring type 2 Dx'ed: 3818 Complications: polyneuropathy, CAD, and renal insufficiency.  Therapy: insulin since 1995.  DKA: never.  Severe hypoglycemia: never.  Pancreatitis: never.  Other: he started pump therapy (now T-slim, since mid-2015), and dexcom G-6 continuous glucose monitor.   Interval history: he takes these pump settings:   basal rate of 2.3 units/hr, 24 hrs per day.   mealtime bolus of 1 unit/2 grams carbohydrate.  However, subtracts 14 units from your calculated lunch bolus.  continue correction bolus (which some people call "sensitivity," or "insulin sensitivity ratio," or just "isr") of 1 unit for each 10 by which glucose exceeds 100.  TDD is approx 118 units (46% basal, 35% meal bolus, 19% correction bolus).   I reviewed continuous glucose monitor data.  Glucose varies from 120-260.  It is in general higher PC than AC, but there is not much trend throughout the day.   pt states he feels well in general.  Past Medical History:  Diagnosis Date  . Allergy   . Anal fissure   . Anemia, unspecified   . Arthritis    Spinal OA  . BACK PAIN, LUMBAR 10/23/2007  . CARDIAC MURMUR, SYSTOLIC 12/01/9369  . DIABETES MELLITUS, TYPE I 05/20/2007  . DIASTOLIC DYSFUNCTION 6/96/7893  . 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[place])   . Duodenitis   . Edema 05/12/2008  . Esophageal stricture   . GERD (gastroesophageal reflux disease)   . 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 Gynecomastia   . Hepatic steatosis   . HYPERLIPIDEMIA 05/20/2007  . Hypertension   . Hypogonadism male   . Morbid obesity ([place]) 09/10/2009  . OBSTRUCTIVE SLEEP APNEA 11/04/2007  . Personal history of colonic polyps   . RENAL INSUFFICIENCY 05/12/2008  . Sleep apnea    uses cpap  . Urolithiasis     Past Surgical History:  Procedure Laterality Date  . Abdominal US   09/1997  . arm fracture Left 1958   with hardware  . Colon cancer screening    . COLONOSCOPY  08/18/2004   diverticulitis  . COLONOSCOPY  09/24/2009  . ELECTROCARDIOGRAM  02/2006  . FLEXIBLE SIGMOIDOSCOPY  03/04/2001   polyps, anal fissure  . GANGLION CYST EXCISION Left 1980  . Lower Arterial  04/13/2004  . POLYPECTOMY    . Rest Cardiolite  03/19/2003    Social History   Socioeconomic History  . Marital status: Married    Spouse name: Not on file  . Number of children: Not on file  . Years of education: Not on file  . Highest education level: Not on file  Occupational History    Employer: [employer]  Tobacco Use  . Smoking status: Former Smoker    Packs/day: 2.00    Years: 31.00    Pack years: 62.00    Types: Cigarettes    Quit date: 10/23/1982    Years since quitting: 37.3  . Smokeless tobacco: Never Used  Substance and Sexual Activity  . Alcohol use: No    Alcohol/week: 0.0 standard drinks  . Drug use: No  . Sexual activity: Not on file  Other Topics Concern  . Not on file  Social History Narrative   Lives with wife in a one story home.  Has a son and a daughter.     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     Education: 2 years of college.   Social Determinants of Health   Financial Resource Strain: Low Risk   . Difficulty of Paying Living Expenses: Not hard at all  Food Insecurity: No Food Insecurity  . Worried About Charity fundraiser in the Last Year: Never true  . Ran Out of Food in the Last Year: Never true  Transportation Needs: No Transportation Needs  . Lack of Transportation (Medical): No  . Lack of Transportation (Non-Medical): No  Physical Activity:   . Days of Exercise per Week:   . Minutes of Exercise per Session:   Stress:   . Feeling of Stress :   Social Connections:   . Frequency of Communication with Friends and Family:   . Frequency of Social Gatherings with Friends and Family:   . Attends Religious Services:   .  Active Member of Clubs or Organizations:   . Attends Archivist Meetings:   Marland Kitchen Marital Status:   Intimate Partner Violence:   . Fear of Current or Ex-Partner:   . Emotionally Abused:   Marland Kitchen Physically Abused:   . Sexually Abused:     Current Outpatient Medications on File Prior to Visit  Medication Sig Dispense Refill  . Accu-Chek FastClix Lancets MISC USE TO CHECK BLOOD SUGAR UP TO 6 TIMES DAILY 612 each 3  . alfuzosin (UROXATRAL) 10 MG 24 hr tablet TAKE 1 TABLET BY MOUTH  DAILY WITH BREAKFAST 90 tablet 3  . allopurinol (ZYLOPRIM) 300 MG tablet TAKE 1 TABLET BY MOUTH  DAILY 90 tablet 3  . amitriptyline (ELAVIL) 25 MG tablet Take 1 tablet (25 mg total) by mouth at bedtime. 30 tablet 1  . aspirin (BAYER LOW STRENGTH) 81 MG EC tablet Take 81 mg by mouth daily.      . Blood Glucose Monitoring Suppl (ACCU-CHEK AVIVA PLUS) w/Device KIT Use as directed 1 kit 0  . carvedilol (COREG) 3.125 MG tablet TAKE 1/2 TABLETS BY MOUTH 2 TIMES DAILY WITH MEALS. 90 tablet 2  . Continuous Blood Gluc Sensor (DEXCOM G6 SENSOR) MISC Inject 1 Device into the skin as directed. Apply to skin SQ every 10 days 9 each 3  . cycloSPORINE (RESTASIS) 0.05 % ophthalmic emulsion Place 1 drop into both eyes 2 (two) times daily.      . diclofenac Sodium (VOLTAREN) 1 % GEL Apply 2 g topically 4 (four) times daily. 350 g 1  . DULoxetine (CYMBALTA) 60 MG capsule TAKE 1 CAPSULE BY MOUTH  DAILY 30 capsule 11  . fluticasone (FLONASE) 50 MCG/ACT nasal spray Place 1 spray into the nose daily as needed for allergies.     . folic acid (FOLVITE) 1 MG tablet Take 2 mg by mouth 2 (two) times daily. 4 tabs daily    . furosemide (LASIX) 20 MG tablet Take 1 tablet (20 mg total) by mouth daily. 90 tablet 2  . glucose blood (ACCU-CHEK AVIVA PLUS) test strip USE TO TEST BLOOD SUGAR 6  TIMES PER DAY; E11.9 600 each 11  . HUMALOG 100 UNIT/ML injection INJECT SUBCUTANEOUSLY 110  UNITS DAILY 100 mL 3  . Insulin Infusion Pump Supplies  (AUTOSOFT XC INFUSION SET) MISC Inject 1 Act into the skin every other day. Use with 29m cannula and 23 inch tubing. *5 boxes (90 day supply) 5 each 3  . Insulin Infusion Pump Supplies (T:SLIM INSULIN CARTRIDGE 3ML) MISC Use with insulin pump, fill once every 2 days. 5 each 3  .  mometasone (ELOCON) 0.1 % lotion APPLY TOPICALLY DAILY 60 mL 11  . Multiple Vitamins-Minerals (CENTRUM SILVER PO) Take 1 tablet by mouth daily.      . Omega-3 Fatty Acids (FISH OIL) 1200 MG CAPS Take 1,200 mg by mouth daily.     Marland Kitchen omeprazole (PRILOSEC) 20 MG capsule Take 1 capsule (20 mg total) by mouth daily as needed. 90 capsule 2  . rosuvastatin (CRESTOR) 40 MG tablet Take 1 tablet (40 mg total) by mouth at bedtime. 90 tablet 2  . vitamin B-12 (CYANOCOBALAMIN) 1000 MCG tablet Take 1,000 mcg by mouth daily.    . Vitamin D, Ergocalciferol, (DRISDOL) 1.25 MG (50000 UT) CAPS capsule Take 1 capsule (50,000 Units total) by mouth every 7 (seven) days. 5 capsule 6   No current facility-administered medications on file prior to visit.    Allergies  Allergen Reactions  . Atorvastatin Other (See Comments)    unknown  . Niacin     REACTION: Severe heartburn  . Pioglitazone     REACTION: Edema    Family History  Problem Relation Age of Onset  . Colon cancer Brother 77  . Colon polyps Brother   . Cancer Brother        lung cancer, deceased  . Other Mother        MVA, deceased 52s  . Healthy Daughter   . Healthy Son   . Rectal cancer Neg Hx   . Stomach cancer Neg Hx     BP 120/62   Pulse 76   Ht 6' (1.829 m)   Wt 280 lb (127 kg)   SpO2 94%   BMI 37.97 kg/m    Review of Systems He denies hypoglycemia    Objective:   Physical Exam VITAL SIGNS:  See vs page GENERAL: no distress Pulses: dorsalis pedis intact bilat.   MSK: no deformity of the feet CV: trace bilat leg edema Skin:  no ulcer on the feet.  normal color and temp on the feet. Neuro: sensation is intact to touch on the feet   Lab Results    Component Value Date   HGBA1C 6.8 (A) 02/11/2020       Assessment & Plan:  Insulin-requiring type 2 DM: well-controlled  Patient Instructions  Please take these pump settings:  basal rate of 2.3 units/hr, 24 hrs per day.   mealtime bolus of 1 unit/2 grams carbohydrate.  However, subtract 14 units from your calculated lunch bolus.  continue correction bolus (which some people call "sensitivity," or "insulin sensitivity ratio," or just "isr") of 1 unit for each 20 by which your glucose exceeds 100.   Please come back for a follow-up appointment in 3 months.

## 2020-02-11 NOTE — Telephone Encounter (Signed)
FAXED [place]: [employer]  Document: Rx for pump software update Other records requested: None requested  All above requested information has been faxed successfully to [employer] listed above. Documents and fax confirmation have been placed in the faxed file for future reference.

## 2020-02-11 NOTE — Patient Instructions (Addendum)
Please take these pump settings:  basal rate of 2.3 units/hr, 24 hrs per day.   mealtime bolus of 1 unit/2 grams carbohydrate.  However, subtract 14 units from your calculated lunch bolus.  continue correction bolus (which some people call "sensitivity," or "insulin sensitivity ratio," or just "isr") of 1 unit for each 20 by which your glucose exceeds 100.   Please come back for a follow-up appointment in 3 months.

## 2020-02-16 ENCOUNTER — Encounter: Payer: Self-pay | Admitting: Physician Assistant

## 2020-02-16 ENCOUNTER — Other Ambulatory Visit: Payer: Self-pay

## 2020-02-16 ENCOUNTER — Ambulatory Visit: Payer: Medicare Other | Admitting: Physician Assistant

## 2020-02-16 DIAGNOSIS — D485 Neoplasm of uncertain behavior of skin: Secondary | ICD-10-CM

## 2020-02-16 DIAGNOSIS — D0472 Carcinoma in situ of skin of left lower limb, including hip: Secondary | ICD-10-CM | POA: Diagnosis not present

## 2020-02-16 DIAGNOSIS — C4492 Squamous cell carcinoma of skin, unspecified: Secondary | ICD-10-CM

## 2020-02-16 DIAGNOSIS — E119 Type 2 diabetes mellitus without complications: Secondary | ICD-10-CM

## 2020-02-16 DIAGNOSIS — L821 Other seborrheic keratosis: Secondary | ICD-10-CM | POA: Diagnosis not present

## 2020-02-16 DIAGNOSIS — L810 Postinflammatory hyperpigmentation: Secondary | ICD-10-CM

## 2020-02-16 HISTORY — DX: Squamous cell carcinoma of skin, unspecified: C44.92

## 2020-02-16 NOTE — Patient Instructions (Signed)

## 2020-02-16 NOTE — Progress Notes (Signed)
   New Patient Visit  Subjective  [name] is a 79 y.o. male who presents for the following: Actinic Keratosis (AK vs SEB K left ankle crust for 3 months). Crusty patch left outer ankle has been present 3-4 months. He is diabetic. This particular area doesn't itch and isnt sore. The patient has picked at it enough to get it bleeding and it bled quite a bit. Brown spot on top of the left foot started as a red patch with bumps. It didn't itch. It stayed red about 3 days and was rough. Now has left a brown area. He feels like his back is covered with brown spots.   Objective  Well appearing patient in no apparent distress; mood and affect are within normal limits.  A focused examination was performed including left ankle, left foot, forearms and back. Relevant physical exam findings are noted in the Assessment and Plan. No suspicious moles noted on back.  Objective  Left Lower Leg - Anterior: Crusted patch left lateral ankle.     Objective  Left Foot - Anterior: Oval area of tan hyperpigmentation.  Objective  Left Upper Back, Mid Back, Right Upper Back: Stuck-on, waxy papules and plaques.   Assessment & Plan  Neoplasm of uncertain behavior of skin Left Lower Leg - Anterior  Skin / nail biopsy Type of biopsy: tangential   Procedure prep:  Patient was prepped and draped in usual sterile fashion (Non sterile) Prep type:  Chlorhexidine Anesthesia: the lesion was anesthetized in a standard fashion   Anesthetic:  1% lidocaine w/ epinephrine 1-100,000 local infiltration Instrument used: flexible razor blade    Specimen 1 - Surgical pathology Differential Diagnosis: bcc vs scc , sk Check Margins: No  Postinflammatory hyperpigmentation Left Foot - Anterior  No treatment needed. Advised I cannot tell what initial inflammation was. If he gets any new patches like that he is to call.  Seborrheic keratosis (3) Left Upper Back; Right Upper Back; Mid Back  No treatment needed.

## 2020-02-19 ENCOUNTER — Telehealth: Payer: Self-pay

## 2020-02-19 NOTE — Telephone Encounter (Signed)
Phone call to patient with his Pathology results.  Patient aware of path results.  Appointment made with Arlyss Gandy on 03/11/2020 @ 10:30am.

## 2020-02-19 NOTE — Telephone Encounter (Signed)
-----   Message from Arlyss Gandy, [place] sent at 02/19/2020 10:47 AM EDT ----- SCC in situ left ankle. 30 min with me.

## 2020-03-02 ENCOUNTER — Other Ambulatory Visit: Payer: Self-pay | Admitting: Physical Medicine & Rehabilitation

## 2020-03-11 ENCOUNTER — Other Ambulatory Visit: Payer: Self-pay

## 2020-03-11 ENCOUNTER — Ambulatory Visit (INDEPENDENT_AMBULATORY_CARE_PROVIDER_SITE_OTHER): Payer: Medicare Other | Admitting: Physician Assistant

## 2020-03-11 ENCOUNTER — Encounter: Payer: Self-pay | Admitting: Physician Assistant

## 2020-03-11 DIAGNOSIS — D0472 Carcinoma in situ of skin of left lower limb, including hip: Secondary | ICD-10-CM | POA: Diagnosis not present

## 2020-03-11 DIAGNOSIS — L821 Other seborrheic keratosis: Secondary | ICD-10-CM

## 2020-03-11 NOTE — Progress Notes (Signed)
   Follow up Visit  Subjective  [name] is a 78 y.o. male who presents for the following: Procedure (Treatment of CIS left ankle.). Left ankle CIS healed fine without any problems.   Objective  Well appearing patient in no apparent distress; mood and affect are within normal limits.  A focused examination was performed including both lower legs. Relevant physical exam findings are noted in the Assessment and Plan.   Objective  Right Lower Leg - Anterior: Stuck-on, waxy papules and plaques x 2 right shin  Objective  Left lower leg, anterior: Biopsy scar identified.  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 Left lower leg, anterior  Destruction of lesion Complexity: simple   Destruction method: electrodesiccation and curettage   Informed consent: discussed and consent obtained   Timeout:  patient name, date of birth, surgical site, and procedure verified Anesthesia: the lesion was anesthetized in a standard fashion   Anesthetic:  1% lidocaine w/ epinephrine 1-100,000 local infiltration Curettage performed in three different directions: Yes   Curettage cycles:  3 Lesion length (cm):  1.5 Lesion width (cm):  1 Margin per side (cm):  0 Final wound size (cm):  1.5 Hemostasis achieved with:  ferric subsulfate Outcome: patient tolerated procedure well with no complications   Post-procedure details: wound care instructions given   Additional details:  Wound innoculated with 5 fluorouracil solution.

## 2020-03-11 NOTE — Patient Instructions (Signed)

## 2020-03-13 ENCOUNTER — Other Ambulatory Visit: Payer: Self-pay | Admitting: Family Medicine

## 2020-03-15 ENCOUNTER — Encounter: Payer: Medicare Other | Attending: Physical Medicine & Rehabilitation | Admitting: Physical Medicine & Rehabilitation

## 2020-03-15 ENCOUNTER — Other Ambulatory Visit: Payer: Self-pay

## 2020-03-15 ENCOUNTER — Encounter: Payer: Self-pay | Admitting: Physical Medicine & Rehabilitation

## 2020-03-15 VITALS — BP 153/74 | HR 78 | Temp 97.0°F | Ht 72.0 in | Wt 280.6 lb

## 2020-03-15 DIAGNOSIS — E1022 Type 1 diabetes mellitus with diabetic chronic kidney disease: Secondary | ICD-10-CM | POA: Insufficient documentation

## 2020-03-15 DIAGNOSIS — E10621 Type 1 diabetes mellitus with foot ulcer: Secondary | ICD-10-CM | POA: Insufficient documentation

## 2020-03-15 DIAGNOSIS — K219 Gastro-esophageal reflux disease without esophagitis: Secondary | ICD-10-CM | POA: Insufficient documentation

## 2020-03-15 DIAGNOSIS — Z8719 Personal history of other diseases of the digestive system: Secondary | ICD-10-CM | POA: Insufficient documentation

## 2020-03-15 DIAGNOSIS — M545 Low back pain, unspecified: Secondary | ICD-10-CM | POA: Insufficient documentation

## 2020-03-15 DIAGNOSIS — M1990 Unspecified osteoarthritis, unspecified site: Secondary | ICD-10-CM | POA: Diagnosis not present

## 2020-03-15 DIAGNOSIS — Z85828 Personal history of other malignant neoplasm of skin: Secondary | ICD-10-CM | POA: Insufficient documentation

## 2020-03-15 DIAGNOSIS — N189 Chronic kidney disease, unspecified: Secondary | ICD-10-CM | POA: Diagnosis not present

## 2020-03-15 DIAGNOSIS — M461 Sacroiliitis, not elsewhere classified: Secondary | ICD-10-CM | POA: Diagnosis not present

## 2020-03-15 DIAGNOSIS — I129 Hypertensive chronic kidney disease with stage 1 through stage 4 chronic kidney disease, or unspecified chronic kidney disease: Secondary | ICD-10-CM | POA: Insufficient documentation

## 2020-03-15 DIAGNOSIS — E1142 Type 2 diabetes mellitus with diabetic polyneuropathy: Secondary | ICD-10-CM | POA: Diagnosis not present

## 2020-03-15 DIAGNOSIS — K760 Fatty (change of) liver, not elsewhere classified: Secondary | ICD-10-CM | POA: Insufficient documentation

## 2020-03-15 DIAGNOSIS — G4733 Obstructive sleep apnea (adult) (pediatric): Secondary | ICD-10-CM | POA: Insufficient documentation

## 2020-03-15 DIAGNOSIS — R251 Tremor, unspecified: Secondary | ICD-10-CM | POA: Insufficient documentation

## 2020-03-15 DIAGNOSIS — E1042 Type 1 diabetes mellitus with diabetic polyneuropathy: Secondary | ICD-10-CM | POA: Diagnosis not present

## 2020-03-15 DIAGNOSIS — Z79899 Other long term (current) drug therapy: Secondary | ICD-10-CM | POA: Diagnosis not present

## 2020-03-15 DIAGNOSIS — Z9989 Dependence on other enabling machines and devices: Secondary | ICD-10-CM | POA: Insufficient documentation

## 2020-03-15 DIAGNOSIS — G8929 Other chronic pain: Secondary | ICD-10-CM | POA: Diagnosis not present

## 2020-03-15 DIAGNOSIS — M792 Neuralgia and neuritis, unspecified: Secondary | ICD-10-CM | POA: Diagnosis not present

## 2020-03-15 DIAGNOSIS — L97529 Non-pressure chronic ulcer of other part of left foot with unspecified severity: Secondary | ICD-10-CM | POA: Insufficient documentation

## 2020-03-15 DIAGNOSIS — Z87891 Personal history of nicotine dependence: Secondary | ICD-10-CM | POA: Insufficient documentation

## 2020-03-15 DIAGNOSIS — R269 Unspecified abnormalities of gait and mobility: Secondary | ICD-10-CM | POA: Diagnosis not present

## 2020-03-15 DIAGNOSIS — F329 Major depressive disorder, single episode, unspecified: Secondary | ICD-10-CM | POA: Diagnosis not present

## 2020-03-15 DIAGNOSIS — E785 Hyperlipidemia, unspecified: Secondary | ICD-10-CM | POA: Insufficient documentation

## 2020-03-15 DIAGNOSIS — M791 Myalgia, unspecified site: Secondary | ICD-10-CM | POA: Diagnosis not present

## 2020-03-15 DIAGNOSIS — G479 Sleep disorder, unspecified: Secondary | ICD-10-CM | POA: Diagnosis not present

## 2020-03-15 NOTE — Progress Notes (Signed)
Subjective:    Patient ID: [name], male    DOB: 1941-02-08, 79 y.o.   MRN: [id number]   HPI  Male with pmh of OSA, CKD, HTN, DM type 1 with peripheral neuropathy presents for follow up of low back pain > neck pain.   On first visit, pt complained of b/l L>R back pain.  Started ~>20 years ago.  No inciting event.  Sitting/laying improve the pain.  Standing exacerbates the pain.  Sharp, burning pain.  Radiates to left posterior thigh.  Intermittent.  He has associated numbness/tingling.  He only tried ASA, which helps some.  Pain limits from doing ADLs and fishing.  He denies falls.  Last clinic visit on 12/16/2019.  Since that time, patient states he is taking Baclofen ~2/week. He is taking Cymbalta. He continues to use Voltaren gel. He is taking Elavil 25, which he is tolerating. Denies falls. His ulcer continues to heal.   Pain Inventory Average Pain 4 Pain Right Now 4 My pain is sharp and tingling  In the last 24 hours, has pain interfered with the following? General activity 4 Relation with others 2 Enjoyment of life 3 What TIME of day is your pain at its worst? daytime Sleep (in general) Good  Pain is worse with: walking, standing and some activites Pain improves with: rest, heat/ice and TENS Relief from Meds: 5  Mobility walk with assistance use a cane how many minutes can you walk? 5-10 ability to climb steps?  yes do you drive?  yes  Function retired  Neuro/Psych bladder control problems tremor tingling  Prior Studies Any changes since last visit?  no  Physicians involved in your care Any changes since last visit?  no   Family History  Problem Relation Age of Onset  . Colon cancer Brother 31  . Colon polyps Brother   . Cancer Brother        lung cancer, deceased  . Other Mother        MVA, deceased 57s  . Healthy Daughter   . Healthy Son   . Rectal cancer Neg Hx   . Stomach cancer Neg Hx    Social History   Socioeconomic History  . Marital  status: Married    Spouse name: Not on file  . Number of children: Not on file  . Years of education: Not on file  . Highest education level: Not on file  Occupational History    Employer: [employer]  Tobacco Use  . Smoking status: Former Smoker    Packs/day: 2.00    Years: 31.00    Pack years: 62.00    Types: Cigarettes    Quit date: 10/23/1982    Years since quitting: 37.4  . Smokeless tobacco: Never Used  Substance and Sexual Activity  . Alcohol use: No    Alcohol/week: 0.0 standard drinks  . Drug use: No  . Sexual activity: Not on file  Other Topics Concern  . Not on file  Social History Narrative   Lives with wife in a one story home.  Has a son and a daughter.     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     Education: 2 years of college.   Social Determinants of Health   Financial Resource Strain: Low Risk   . Difficulty of Paying Living Expenses: Not hard at all  Food Insecurity: No Food Insecurity  . Worried About Charity fundraiser in the Last Year: Never true  .  Ran Out of Food in the Last Year: Never true  Transportation Needs: No Transportation Needs  . Lack of Transportation (Medical): No  . Lack of Transportation (Non-Medical): No  Physical Activity:   . Days of Exercise per Week:   . Minutes of Exercise per Session:   Stress:   . Feeling of Stress :   Social Connections:   . Frequency of Communication with Friends and Family:   . Frequency of Social Gatherings with Friends and Family:   . Attends Religious Services:   . Active Member of Clubs or Organizations:   . Attends Archivist Meetings:   Marland Kitchen Marital Status:    Past Surgical History:  Procedure Laterality Date  . Abdominal US  09/1997  . arm fracture Left 1958   with hardware  . Colon cancer screening    . COLONOSCOPY  08/18/2004   diverticulitis  . COLONOSCOPY  09/24/2009  . ELECTROCARDIOGRAM  02/2006  . FLEXIBLE SIGMOIDOSCOPY  03/04/2001   polyps, anal  fissure  . GANGLION CYST EXCISION Left 1980  . Lower Arterial  04/13/2004  . POLYPECTOMY    . Rest Cardiolite  03/19/2003   Past Medical History:  Diagnosis Date  . Allergy   . Anal fissure   . Anemia, unspecified   . Arthritis    Spinal OA  . BACK PAIN, LUMBAR 10/23/2007  . CARDIAC MURMUR, SYSTOLIC 01/21/6605  . DIABETES MELLITUS, TYPE I 05/20/2007  . DIASTOLIC DYSFUNCTION 12/21/6008  . 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[place])   . Duodenitis   . Edema 05/12/2008  . Esophageal stricture   . GERD (gastroesophageal reflux disease)   . 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 Gynecomastia   . Hepatic steatosis   . HYPERLIPIDEMIA 05/20/2007  . Hypertension   . Hypogonadism male   . Morbid obesity ([place]) 09/10/2009  . OBSTRUCTIVE SLEEP APNEA 11/04/2007  . Personal history of colonic polyps   . RENAL INSUFFICIENCY 05/12/2008  . Sleep apnea    uses cpap  . Squamous cell carcinoma of skin 02/16/2020   left lower leg, anterior-cx3,25fu  . Urolithiasis    BP (!) 153/74   Pulse 78   Temp (!) 97 F (36.1 C)   Ht 6' (1.829 m)   Wt 280 lb 9.6 oz (127.3 kg)   SpO2 96%   BMI 38.06 kg/m   Opioid Risk Score:   Fall Risk Score:  `1  Depression screen PHQ 2/9  Depression screen [facility] 2/9 12/31/2019 09/18/2018 09/20/2017 08/31/2016  Decreased Interest 0 0 0 0  Down, Depressed, Hopeless 0 0 0 0  PHQ - 2 Score 0 0 0 0  Altered sleeping - - - 0  Tired, decreased energy - - - 1  Change in appetite - - - 0  Feeling bad or failure about yourself  - - - 0  Trouble concentrating - - - 0  Moving slowly or fidgety/restless - - - 0  Suicidal thoughts - - - 0  PHQ-9 Score - - - 1  Difficult doing work/chores - - - Somewhat difficult  Some recent data might be hidden    Review of Systems  HENT: Negative.   Eyes: Negative.   Respiratory: Positive for apnea and shortness of breath.   Cardiovascular: Negative.   Gastrointestinal: Negative.   Endocrine:       Diabetic High blood sugar  Genitourinary:  Negative.           Musculoskeletal: Positive for arthralgias, back pain, myalgias and  neck pain.  Skin: Negative.   Allergic/Immunologic: Negative.   Neurological: Positive for weakness and numbness.       Tingling  Hematological: Negative.       Objective:   Physical Exam  Constitutional: NAD. Respiratory: Normal effort.  Psych: Normal mood.  Normal behavior. Musc: Gait: Mildly antalgic Neuro: Alert             Strength          5/5 in all LE myotomes    Assessment & Plan:  Male with pmh of OSA, CKD, HTN, DM type 1 with peripheral neuropathy presents for follow up of low back pain >neck pain.   1. Chronic mechanical low back pain MRIs C,L-spine early 2016 suggesting multilevel spondylosis with mild b/l multilevel foraminal narrowing C4-C7 and shallow disc bulge at L5-S1 without central canal or foraminal stenosis. Avoid NSAIDs due to CKD Unable to tolerate Gabapentin, Lyrica Cont HEP  Continue  TENs Aquatic therapy, not going anymore, believes he can just live with the pain and that he is lazy Cont tylenol Cont Robaxin 500 TID PRN, changed to Baclofen 10TID PRN due to change insurance, continue   Continue Cymbalta 60mg  Contback brace during periods of excessive activity, reminded to avoid prolonged use Cont Lidoderm OTC Encouraged rest breaks             Acknowledges he can do more if he make a conscious effort PT completed             Continue Voltaren gel - good benefit  2. Sacroiliitis See #1 Cont brace Not main pain generator at present, does not wish to proceed with injection at this time, particularly due to DM  3. Sleep disturbance             Recently new mattress             Cont CPAP             Improved  4. Myalgia Good benefit with trigger point injections for a short  period No need to repeat at present  5. Diabetic neuropathy See #1, #3             Side effects with elavil to 50mg  qhs, continue 25mg  educated on signs/symptoms of seratonin syndrome previously  6. Morbid obesity Not interested in seeing dietitian at this time  No weight loss  7. Gait abnormality Completed therapies  Continue cane for safety  8. Nonhealing ulcer             Left lateral foot, improving

## 2020-03-19 ENCOUNTER — Encounter (INDEPENDENT_AMBULATORY_CARE_PROVIDER_SITE_OTHER): Payer: Self-pay

## 2020-04-08 ENCOUNTER — Other Ambulatory Visit: Payer: Self-pay

## 2020-04-09 ENCOUNTER — Encounter: Payer: Self-pay | Admitting: Family Medicine

## 2020-04-09 ENCOUNTER — Ambulatory Visit: Payer: Medicare Other | Admitting: Family Medicine

## 2020-04-09 VITALS — BP 136/70 | HR 68 | Temp 97.3°F | Ht 72.0 in | Wt 279.4 lb

## 2020-04-09 DIAGNOSIS — E7800 Pure hypercholesterolemia, unspecified: Secondary | ICD-10-CM

## 2020-04-09 DIAGNOSIS — I10 Essential (primary) hypertension: Secondary | ICD-10-CM | POA: Diagnosis not present

## 2020-04-09 DIAGNOSIS — N1831 Chronic kidney disease, stage 3a: Secondary | ICD-10-CM | POA: Diagnosis not present

## 2020-04-09 LAB — CBC
HCT: 44 % (ref 39.0–52.0)
Hemoglobin: 14.9 g/dL (ref 13.0–17.0)
MCHC: 33.9 g/dL (ref 30.0–36.0)
MCV: 92.7 fl (ref 78.0–100.0)
Platelets: 116 10*3/uL — ABNORMAL LOW (ref 150.0–400.0)
RBC: 4.74 Mil/uL (ref 4.22–5.81)
RDW: 14.7 % (ref 11.5–15.5)
WBC: 4.5 10*3/uL (ref 4.0–10.5)

## 2020-04-09 LAB — BASIC METABOLIC PANEL
BUN: 19 mg/dL (ref 6–23)
CO2: 32 mEq/L (ref 19–32)
Calcium: 9.2 mg/dL (ref 8.4–10.5)
Chloride: 105 mEq/L (ref 96–112)
Creatinine, Ser: 1.49 mg/dL (ref 0.40–1.50)
GFR: 45.56 mL/min — ABNORMAL LOW (ref >60.00)
Glucose, Bld: 111 mg/dL — ABNORMAL HIGH (ref 70–99)
Potassium: 3.7 mEq/L (ref 3.5–5.1)
Sodium: 140 mEq/L (ref 135–145)

## 2020-04-09 LAB — LDL CHOLESTEROL, DIRECT: Direct LDL: 70 mg/dL

## 2020-04-09 NOTE — Progress Notes (Signed)
Established Patient Office Visit  Subjective:  Patient ID: [name], male    DOB: May 16, 1941  Age: 79 y.o. MRN: [id number]  CC:  Chief Complaint  Patient presents with  . Follow-up    3 month follow up on BP. Patient has concerns about thumbs being shaky for about 2-4 months.     HPI [name] presents for follow-up of his blood pressure, CKD, elevated cholesterol and deconditioning.  Blood pressure continues to be controlled with carvedilol and Lasix.  CKD has been stable.  Continues to take rosuvastatin for his cholesterol.  Back is been bothering him a great deal despite multiple therapeutic modalities.  He knows that he needs to lose weight needs to lose weight.  Just not motivated to do so.  Continues with Cymbalta.  [facility] about [facility] [employer] to [facility]  Past Medical History:  Diagnosis Date  . Allergy   . Anal fissure   . Anemia, unspecified   . Arthritis    Spinal OA  . BACK PAIN, LUMBAR 10/23/2007  . CARDIAC MURMUR, SYSTOLIC 04/24/4286  . DIABETES MELLITUS, TYPE I 05/20/2007  . DIASTOLIC DYSFUNCTION 6/81/1572  . 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[place])   . Duodenitis   . Edema 05/12/2008  . Esophageal stricture   . GERD (gastroesophageal reflux disease)   . 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 Gynecomastia   . Hepatic steatosis   . HYPERLIPIDEMIA 05/20/2007  . Hypertension   . Hypogonadism male   . Morbid obesity ([place]) 09/10/2009  . OBSTRUCTIVE SLEEP APNEA 11/04/2007  . Personal history of colonic polyps   . RENAL INSUFFICIENCY 05/12/2008  . Sleep apnea    uses cpap  . Squamous cell carcinoma of skin 02/16/2020   left lower leg, anterior-cx3,36f  . Urolithiasis     Past Surgical History:  Procedure Laterality Date  . Abdominal [place] 09/1997  . arm fracture Left 1958   with hardware  . Colon cancer screening    . COLONOSCOPY  08/18/2004   diverticulitis  . COLONOSCOPY  09/24/2009  . ELECTROCARDIOGRAM  02/2006  . FLEXIBLE SIGMOIDOSCOPY  03/04/2001    polyps, anal fissure  . GANGLION CYST EXCISION Left 1980  . Lower Arterial  04/13/2004  . POLYPECTOMY    . Rest Cardiolite  03/19/2003    Family History  Problem Relation Age of Onset  . Colon cancer Brother 553 . Colon polyps Brother   . Cancer Brother        lung cancer, deceased  . Other Mother        MVA, deceased 559s . Healthy Daughter   . Healthy Son   . Rectal cancer Neg Hx   . Stomach cancer Neg Hx     Social History   Socioeconomic History  . Marital status: Married    Spouse name: Not on file  . Number of children: Not on file  . Years of education: Not on file  . Highest education level: Not on file  Occupational History    Employer: [employer]  Tobacco Use  . Smoking status: Former Smoker    Packs/day: 2.00    Years: 31.00    Pack years: 62.00    Types: Cigarettes    Quit date: 10/23/1982    Years since quitting: 37.4  . Smokeless tobacco: Never Used  Substance and Sexual Activity  . Alcohol use: No    Alcohol/week: 0.0 standard drinks  . Drug use: No  . Sexual activity:  Not on file  Other Topics Concern  . Not on file  Social History Narrative   Lives with wife in a one story home.  Has a son and a daughter.     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     Education: 2 years of college.   Social Determinants of Health   Financial Resource Strain: Low Risk   . Difficulty of Paying Living Expenses: Not hard at all  Food Insecurity: No Food Insecurity  . Worried About Charity fundraiser in the Last Year: Never true  . Ran Out of Food in the Last Year: Never true  Transportation Needs: No Transportation Needs  . Lack of Transportation (Medical): No  . Lack of Transportation (Non-Medical): No  Physical Activity:   . Days of Exercise per Week:   . Minutes of Exercise per Session:   Stress:   . Feeling of Stress :   Social Connections:   . Frequency of Communication with Friends and Family:   . Frequency of Social  Gatherings with Friends and Family:   . Attends Religious Services:   . Active Member of Clubs or Organizations:   . Attends Archivist Meetings:   Marland Kitchen Marital Status:   Intimate Partner Violence:   . Fear of Current or Ex-Partner:   . Emotionally Abused:   Marland Kitchen Physically Abused:   . Sexually Abused:     Outpatient Medications Prior to Visit  Medication Sig Dispense Refill  . Accu-Chek FastClix Lancets MISC USE TO CHECK BLOOD SUGAR UP TO 6 TIMES DAILY 612 each 3  . alfuzosin (UROXATRAL) 10 MG 24 hr tablet TAKE 1 TABLET BY MOUTH  DAILY WITH BREAKFAST 90 tablet 3  . allopurinol (ZYLOPRIM) 300 MG tablet TAKE 1 TABLET BY MOUTH  DAILY 90 tablet 3  . amitriptyline (ELAVIL) 25 MG tablet TAKE 1 TABLET(25 MG) BY MOUTH AT BEDTIME 30 tablet 1  . aspirin (BAYER LOW STRENGTH) 81 MG EC tablet Take 81 mg by mouth daily.      . Blood Glucose Monitoring Suppl (ACCU-CHEK AVIVA PLUS) w/Device KIT Use as directed 1 kit 0  . carvedilol (COREG) 3.125 MG tablet TAKE 1/2 TABLETS BY MOUTH 2 TIMES DAILY WITH MEALS. 90 tablet 2  . Continuous Blood Gluc Sensor (DEXCOM G6 SENSOR) MISC Inject 1 Device into the skin as directed. Apply to skin SQ every 10 days 9 each 3  . cycloSPORINE (RESTASIS) 0.05 % ophthalmic emulsion Place 1 drop into both eyes 2 (two) times daily.      . diclofenac Sodium (VOLTAREN) 1 % GEL Apply 2 g topically 4 (four) times daily. 350 g 1  . DULoxetine (CYMBALTA) 60 MG capsule TAKE 1 CAPSULE BY MOUTH  DAILY 30 capsule 11  . fluticasone (FLONASE) 50 MCG/ACT nasal spray Place 1 spray into the nose daily as needed for allergies.     . folic acid (FOLVITE) 1 MG tablet Take 2 mg by mouth 2 (two) times daily. 4 tabs daily    . furosemide (LASIX) 20 MG tablet Take 1 tablet (20 mg total) by mouth daily. 90 tablet 2  . glucose blood (ACCU-CHEK AVIVA PLUS) test strip USE TO TEST BLOOD SUGAR 6  TIMES PER DAY; E11.9 600 each 11  . HUMALOG 100 UNIT/ML injection INJECT SUBCUTANEOUSLY 110  UNITS DAILY  100 mL 3  . Insulin Infusion Pump Supplies (AUTOSOFT XC INFUSION SET) MISC Inject 1 Act into the skin every other day. Use with 38m  cannula and 23 inch tubing. *5 boxes (90 day supply) 5 each 3  . Insulin Infusion Pump Supplies (T:SLIM INSULIN CARTRIDGE 3ML) MISC Use with insulin pump, fill once every 2 days. 5 each 3  . mometasone (ELOCON) 0.1 % lotion APPLY TOPICALLY DAILY 60 mL 11  . Multiple Vitamins-Minerals (CENTRUM SILVER PO) Take 1 tablet by mouth daily.      . Omega-3 Fatty Acids (FISH OIL) 1200 MG CAPS Take 1,200 mg by mouth daily.     Marland Kitchen omeprazole (PRILOSEC) 20 MG capsule Take 1 capsule (20 mg total) by mouth daily as needed. 90 capsule 2  . rosuvastatin (CRESTOR) 40 MG tablet TAKE 1 TABLET BY MOUTH AT  BEDTIME 90 tablet 3  . vitamin B-12 (CYANOCOBALAMIN) 1000 MCG tablet Take 1,000 mcg by mouth daily.    . Vitamin D, Ergocalciferol, (DRISDOL) 1.25 MG (50000 UT) CAPS capsule Take 1 capsule (50,000 Units total) by mouth every 7 (seven) days. 5 capsule 6   No facility-administered medications prior to visit.    Allergies  Allergen Reactions  . Atorvastatin Other (See Comments)    unknown  . Niacin     REACTION: Severe heartburn  . Pioglitazone     REACTION: Edema    ROS Review of Systems  Constitutional: Negative.   HENT: Negative.   Eyes: Negative for photophobia and visual disturbance.  Respiratory: Negative.   Cardiovascular: Negative.   Gastrointestinal: Negative.   Endocrine: Negative for polyphagia and polyuria.  Genitourinary: Negative.   Musculoskeletal: Positive for back pain.  Neurological: Negative for speech difficulty and weakness.  Hematological: Does not bruise/bleed easily.      Objective:    Physical Exam Vitals and nursing note reviewed.  Constitutional:      General: He is not in acute distress.    Appearance: Normal appearance. He is obese. He is not toxic-appearing or diaphoretic.  HENT:     Head: Normocephalic and atraumatic.     Right  Ear: External ear normal.     Left Ear: External ear normal.  Eyes:     General: No scleral icterus.       Right eye: No discharge.        Left eye: No discharge.     Conjunctiva/sclera: Conjunctivae normal.  Cardiovascular:     Rate and Rhythm: Normal rate and regular rhythm.  Pulmonary:     Breath sounds: Normal breath sounds.  Musculoskeletal:     Cervical back: No tenderness.  Lymphadenopathy:     Cervical: No cervical adenopathy.  Neurological:     Mental Status: He is alert and oriented to person, place, and time.  Psychiatric:        Mood and Affect: Mood normal.        Behavior: Behavior normal.     BP 136/70   Pulse 68   Temp (!) 97.3 F (36.3 C) (Tympanic)   Ht 6' (1.829 m)   Wt 279 lb 6.4 oz (126.7 kg)   SpO2 96%   BMI 37.89 kg/m  Wt Readings from Last 3 Encounters:  04/09/20 279 lb 6.4 oz (126.7 kg)  03/15/20 280 lb 9.6 oz (127.3 kg)  02/11/20 280 lb (127 kg)     Health Maintenance Due  Topic Date Due  . Hepatitis C Screening  Never done  . OPHTHALMOLOGY EXAM  06/20/2019  . FOOT EXAM  12/25/2019    There are no preventive care reminders to display for this patient.  Lab Results  Component Value Date  TSH 2.99 10/09/2019   Lab Results  Component Value Date   WBC 4.4 01/08/2020   HGB 14.9 01/08/2020   HCT 44.6 01/08/2020   MCV 91.7 01/08/2020   PLT 123.0 (L) 01/08/2020   Lab Results  Component Value Date   NA 141 01/08/2020   K 4.3 01/08/2020   CO2 35 (H) 01/08/2020   GLUCOSE 125 (H) 01/08/2020   BUN 18 01/08/2020   CREATININE 1.38 01/08/2020   BILITOT 0.6 10/09/2019   ALKPHOS 85 10/09/2019   AST 15 10/09/2019   ALT 18 10/09/2019   PROT 6.4 10/09/2019   ALBUMIN 4.1 10/09/2019   CALCIUM 9.2 01/08/2020   ANIONGAP 8 11/28/2014   GFR 49.81 (L) 01/08/2020   Lab Results  Component Value Date   CHOL 116 07/10/2019   Lab Results  Component Value Date   HDL 32.20 (L) 07/10/2019   Lab Results  Component Value Date   LDLCALC 57  07/10/2019   Lab Results  Component Value Date   TRIG 137.0 07/10/2019   Lab Results  Component Value Date   CHOLHDL 4 07/10/2019   Lab Results  Component Value Date   HGBA1C 6.8 (A) 02/11/2020      Assessment & Plan:   Problem List Items Addressed This Visit      Cardiovascular and Mediastinum   Essential hypertension - Primary   Relevant Orders   Basic metabolic panel   CBC     Genitourinary   Stage 3a chronic kidney disease   Relevant Orders   Basic metabolic panel     Other   Elevated LDL cholesterol level   Relevant Orders   LDL cholesterol, direct      No orders of the defined types were placed in this encounter.   Follow-up: Return in about 3 months (around 07/10/2020).   Encouraged increased exercise just by walking her first and go ahead and join the [facility] works on strength. Libby Maw, MD

## 2020-05-05 ENCOUNTER — Encounter: Payer: Self-pay | Admitting: Endocrinology

## 2020-05-07 ENCOUNTER — Ambulatory Visit: Payer: Medicare Other | Admitting: Internal Medicine

## 2020-05-07 ENCOUNTER — Encounter: Payer: Self-pay | Admitting: Internal Medicine

## 2020-05-07 ENCOUNTER — Other Ambulatory Visit: Payer: Self-pay

## 2020-05-07 DIAGNOSIS — R918 Other nonspecific abnormal finding of lung field: Secondary | ICD-10-CM

## 2020-05-07 DIAGNOSIS — G4733 Obstructive sleep apnea (adult) (pediatric): Secondary | ICD-10-CM

## 2020-05-07 NOTE — Patient Instructions (Signed)
We can continue CPAP 11.6/ [facility]  Please call if we can help

## 2020-05-07 NOTE — Progress Notes (Signed)
Subjective:    Patient ID: [name], male    DOB: 02-02-1941, 79 y.o.   MRN: [id number]  HPI   male former smoker followed for OSA, complicated by obesity, HBP, DM 2, diastolic dysfunction, GERD NPSG 2004 AHI 9/hr  --------------------------------------------------------------------------------------------   05/08/2019-  79 year old male followed for OSA, lung nodules,  complicated by obesity, HBP, DM 2, diastolic dysfunction, GERD CPAP 11.6/[facility] -----OSA on CPAP 11.6, DME: [facility]; no complaints, using machine every night Download compliance 100%, AHI 2.7/ hr Lung nodules considered benign on CT 2017. Body weight today 275 lbs Very happy and comfortable with his CPAP- sleeps well. No significant cough or breathing complaint. Being evaluated for falls  05/07/20- 79 year old male former smoker followed for OSA, lung nodules,  complicated by obesity, HBP, DM 1, diastolic dysfunction, GERD CPAP 11.6/[facility] Download- compliance 97%, AHI 6.3/ hr  Mostly centrals Body weight today- 282 lbs -----using cpap, pulling on mask at night,causing leaks, DME [facility] Still doing pretty well- better off with CPAP. Did mask fitting.  Settled on full-face. Reviewed download. CXR 05/08/2019- IMPRESSION: 1. Stable scarring in the lingula. No acute cardiopulmonary disease. 2. The small BILATERAL lung nodule identified on the prior CT are too small to visualized on the chest x-ray. However, based on their appearance on the prior CT, they are likely benign. Given the patient's smoking history, a final follow-up chest CT may be helpful to confirm benignity, based on Fleischner society recommendations.  //Consider f/u CXR or CT for nodules next ov//    Review of Systems + = Positive Constitutional: Negative for fever and unexpected weight change.  HENT: Negative for congestion, dental problem, ear pain, nosebleeds, postnasal drip, rhinorrhea, sinus pressure,  sneezing, sore throat and trouble swallowing.   Eyes: Negative for redness and itching.  Respiratory: Negative for cough, chest tightness, shortness of breath and wheezing.   Cardiovascular: Negative for palpitations and leg swelling.  Gastrointestinal: Negative for nausea and vomiting.  Genitourinary: Negative for dysuria.  Musculoskeletal: Negative for joint swelling.  Skin: Negative for rash.  Neurological: Negative for headaches. + falls Hematological: Does not bruise/bleed easily.  Psychiatric/Behavioral: Negative for dysphoric mood. The patient is not nervous/anxious.    Objective:  OBJ- Physical Exam General- Alert, Oriented, Affect-appropriate, Distress- none acute, + obese Skin- rash-none, lesions- none, excoriation- none Lymphadenopathy- none Head- atraumatic            Eyes- Gross vision intact, PERRLA, conjunctivae and secretions clear            Ears- Hearing, canals-normal            Nose- Clear, no-Septal dev, mucus, polyps, erosion, perforation             Throat- Mallampati III , mucosa clear , drainage- none, tonsils- atrophic Neck- flexible , trachea midline, no stridor , thyroid nl, carotid no bruit Chest - symmetrical excursion , unlabored           Heart/CV- RRR , no murmur , no gallop  , no rub, nl s1 s2                           - JVD- none , edema- none, stasis changes- none, varices- none           Lung- clear to P&A, wheeze- none, cough- none , dullness-none, rub- none           Chest wall-  Abd-  Br/ Gen/ Rectal- Not done, not indicated Extrem- cyanosis- none, clubbing, none, atrophy- none, strength- nl Neuro- grossly intact to observation    Assessment & Plan:

## 2020-05-12 ENCOUNTER — Other Ambulatory Visit: Payer: Self-pay

## 2020-05-12 ENCOUNTER — Encounter: Payer: Self-pay | Admitting: Endocrinology

## 2020-05-12 ENCOUNTER — Encounter: Payer: Self-pay | Admitting: Gastroenterology

## 2020-05-12 ENCOUNTER — Ambulatory Visit: Payer: Medicare Other | Admitting: Endocrinology

## 2020-05-12 VITALS — BP 118/74 | HR 89 | Ht 72.0 in | Wt 281.0 lb

## 2020-05-12 DIAGNOSIS — E1142 Type 2 diabetes mellitus with diabetic polyneuropathy: Secondary | ICD-10-CM

## 2020-05-12 DIAGNOSIS — Z794 Long term (current) use of insulin: Secondary | ICD-10-CM

## 2020-05-12 DIAGNOSIS — E1159 Type 2 diabetes mellitus with other circulatory complications: Secondary | ICD-10-CM

## 2020-05-12 DIAGNOSIS — E0800 Diabetes mellitus due to underlying condition with hyperosmolarity without nonketotic hyperglycemic-hyperosmolar coma (NKHHC): Secondary | ICD-10-CM

## 2020-05-12 LAB — POCT GLYCOSYLATED HEMOGLOBIN (HGB A1C): Hemoglobin A1C: 6.4 % — AB (ref 4.0–5.6)

## 2020-05-12 MED ORDER — INSULIN LISPRO 100 UNIT/ML ~~LOC~~ SOLN: SUBCUTANEOUS | 3 refills | Status: DC

## 2020-05-12 NOTE — Patient Instructions (Addendum)
Please take these pump settings:  basal rate of 2.2 units/hr, 24 hrs per day.   mealtime bolus of 1 unit/2 grams carbohydrate.  However, subtract 14 units from your calculated lunch bolus.  continue correction bolus (which some people call "sensitivity," or "insulin sensitivity ratio," or just "isr") of 1 unit for each 20 by which your glucose exceeds 100.   Please come back for a follow-up appointment in 3 months.

## 2020-05-12 NOTE — Progress Notes (Signed)
Subjective:    Patient ID: [name], male    DOB: 1941/10/03, 79 y.o.   MRN: [id number]  HPI Pt returns for f/u of diabetes mellitus: DM type: Insulin-requiring type 2 Dx'ed: 4825 Complications: PN, CAD, and CRI.   Therapy: insulin since 1995.  DKA: never.  Severe hypoglycemia: never.  Pancreatitis: never.  Other: he started pump therapy (now T-slim, since mid-2015), and dexcom G-6 continuous glucose monitor; he declines to add non-insulin rx Interval history: he takes these pump settings:   basal rate of 2.3 units/hr, 24 hrs per day.   mealtime bolus of 1 unit/2 grams carbohydrate.  However, subtract 14 units from your calculated lunch bolus.  continue correction bolus (which some people call "sensitivity," or "insulin sensitivity ratio," or just "isr") of 1 unit for each 20 by which your glucose exceeds 100.   "Control-IQ" is on 96% of the time TDD is approx 117 units (49% basal, 45% meal bolus, 1% correction bolus).   I reviewed continuous glucose monitor data.  Glucose varies from 62-287.  It is in general higher PC than AC, but there is not much trend throughout the day.  Glucose falls throughout the night.  pt states he feels well in general.  Past Medical History:  Diagnosis Date  . Allergy   . Anal fissure   . Anemia, unspecified   . Arthritis    Spinal OA  . BACK PAIN, LUMBAR 10/23/2007  . CARDIAC MURMUR, SYSTOLIC 0/0/3704  . DIABETES MELLITUS, TYPE I 05/20/2007  . DIASTOLIC DYSFUNCTION 8/88/9169  . 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[place])   . Duodenitis   . Edema 05/12/2008  . Esophageal stricture   . GERD (gastroesophageal reflux disease)   . 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 Gynecomastia   . Hepatic steatosis   . HYPERLIPIDEMIA 05/20/2007  . Hypertension   . Hypogonadism male   . Morbid obesity ([place]) 09/10/2009  . OBSTRUCTIVE SLEEP APNEA 11/04/2007  . Personal history of colonic polyps   . RENAL INSUFFICIENCY 05/12/2008  . Sleep apnea    uses cpap  . 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 Urolithiasis     Past Surgical History:  Procedure Laterality Date  . Abdominal [place] 09/1997  . arm fracture Left 1958   with hardware  . Colon cancer screening    . COLONOSCOPY  08/18/2004   diverticulitis  . COLONOSCOPY  09/24/2009  . ELECTROCARDIOGRAM  02/2006  . FLEXIBLE SIGMOIDOSCOPY  03/04/2001   polyps, anal fissure  . GANGLION CYST EXCISION Left 1980  . Lower Arterial  04/13/2004  . POLYPECTOMY    . Rest Cardiolite  03/19/2003    Social History   Socioeconomic History  . Marital status: Married    Spouse name: Not on file  . Number of children: Not on file  . Years of education: Not on file  . Highest education level: Not on file  Occupational History    Employer: [employer]  Tobacco Use  . Smoking status: Former Smoker    Packs/day: 2.00    Years: 31.00    Pack years: 62.00    Types: Cigarettes    Quit date: 10/23/1982    Years since quitting: 37.5  . Smokeless tobacco: Never Used  Substance and Sexual Activity  . Alcohol use: No    Alcohol/week: 0.0 standard drinks  . Drug use: No  . Sexual activity: Not on file  Other Topics Concern  .  Not on file  Social History Narrative   Lives with wife in a one story home.  Has a son and a daughter.     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     Education: 2 years of college.   Social Determinants of Health   Financial Resource Strain: Low Risk   . Difficulty of Paying Living Expenses: Not hard at all  Food Insecurity: No Food Insecurity  . Worried About Charity fundraiser in the Last Year: Never true  . Ran Out of Food in the Last Year: Never true  Transportation Needs: No Transportation Needs  . Lack of Transportation (Medical): No  . Lack of Transportation (Non-Medical): No  Physical Activity:   . Days of Exercise per Week:   . Minutes of Exercise per Session:   Stress:   . Feeling of Stress :   Social  Connections:   . Frequency of Communication with Friends and Family:   . Frequency of Social Gatherings with Friends and Family:   . Attends Religious Services:   . Active Member of Clubs or Organizations:   . Attends Archivist Meetings:   Marland Kitchen Marital Status:   Intimate Partner Violence:   . Fear of Current or Ex-Partner:   . Emotionally Abused:   Marland Kitchen Physically Abused:   . Sexually Abused:     Current Outpatient Medications on File Prior to Visit  Medication Sig Dispense Refill  . Accu-Chek FastClix Lancets MISC USE TO CHECK BLOOD SUGAR UP TO 6 TIMES DAILY 612 each 3  . alfuzosin (UROXATRAL) 10 MG 24 hr tablet TAKE 1 TABLET BY MOUTH  DAILY WITH BREAKFAST 90 tablet 3  . allopurinol (ZYLOPRIM) 300 MG tablet TAKE 1 TABLET BY MOUTH  DAILY 90 tablet 3  . amitriptyline (ELAVIL) 25 MG tablet TAKE 1 TABLET(25 MG) BY MOUTH AT BEDTIME 30 tablet 1  . aspirin (BAYER LOW STRENGTH) 81 MG EC tablet Take 81 mg by mouth daily.      . Blood Glucose Monitoring Suppl (ACCU-CHEK AVIVA PLUS) w/Device KIT Use as directed 1 kit 0  . carvedilol (COREG) 3.125 MG tablet TAKE 1/2 TABLETS BY MOUTH 2 TIMES DAILY WITH MEALS. 90 tablet 2  . Continuous Blood Gluc Sensor (DEXCOM G6 SENSOR) MISC Inject 1 Device into the skin as directed. Apply to skin SQ every 10 days 9 each 3  . cycloSPORINE (RESTASIS) 0.05 % ophthalmic emulsion Place 1 drop into both eyes 2 (two) times daily.      . diclofenac Sodium (VOLTAREN) 1 % GEL Apply 2 g topically 4 (four) times daily. 350 g 1  . DULoxetine (CYMBALTA) 60 MG capsule TAKE 1 CAPSULE BY MOUTH  DAILY 30 capsule 11  . fluticasone (FLONASE) 50 MCG/ACT nasal spray Place 1 spray into the nose daily as needed for allergies.     . folic acid (FOLVITE) 1 MG tablet Take 2 mg by mouth 2 (two) times daily. 4 tabs daily    . furosemide (LASIX) 20 MG tablet Take 1 tablet (20 mg total) by mouth daily. 90 tablet 2  . glucose blood (ACCU-CHEK AVIVA PLUS) test strip USE TO TEST BLOOD SUGAR  6  TIMES PER DAY; E11.9 600 each 11  . Insulin Infusion Pump Supplies (AUTOSOFT XC INFUSION SET) MISC Inject 1 Act into the skin every other day. Use with 51m cannula and 23 inch tubing. *5 boxes (90 day supply) 5 each 3  . Insulin Infusion Pump Supplies (T:SLIM INSULIN  CARTRIDGE 3ML) MISC Use with insulin pump, fill once every 2 days. 5 each 3  . mometasone (ELOCON) 0.1 % lotion APPLY TOPICALLY DAILY 60 mL 11  . Multiple Vitamins-Minerals (CENTRUM SILVER PO) Take 1 tablet by mouth daily.      . Omega-3 Fatty Acids (FISH OIL) 1200 MG CAPS Take 1,200 mg by mouth daily.     Marland Kitchen omeprazole (PRILOSEC) 20 MG capsule Take 1 capsule (20 mg total) by mouth daily as needed. 90 capsule 2  . rosuvastatin (CRESTOR) 40 MG tablet TAKE 1 TABLET BY MOUTH AT  BEDTIME 90 tablet 3  . vitamin B-12 (CYANOCOBALAMIN) 1000 MCG tablet Take 1,000 mcg by mouth daily.    . Vitamin D, Ergocalciferol, (DRISDOL) 1.25 MG (50000 UT) CAPS capsule Take 1 capsule (50,000 Units total) by mouth every 7 (seven) days. 5 capsule 6   No current facility-administered medications on file prior to visit.    Allergies  Allergen Reactions  . Atorvastatin Other (See Comments)    unknown  . Niacin     REACTION: Severe heartburn  . Pioglitazone     REACTION: Edema    Family History  Problem Relation Age of Onset  . Colon cancer Brother 87  . Colon polyps Brother   . Cancer Brother        lung cancer, deceased  . Other Mother        MVA, deceased 76s  . Healthy Daughter   . Healthy Son   . Rectal cancer Neg Hx   . Stomach cancer Neg Hx     BP 118/74 (BP Location: Left Arm, Patient Position: Sitting, Cuff Size: Normal)   Pulse 89   Ht 6' (1.829 m)   Wt 281 lb (127.5 kg)   SpO2 94%   BMI 38.11 kg/m    Review of Systems     Objective:   Physical Exam VITAL SIGNS:  See vs page GENERAL: no distress Pulses: dorsalis pedis intact bilat.   MSK: no deformity of the feet CV: trace bilat leg edema Skin:  no ulcer on the  feet.  normal color and temp on the feet. Neuro: sensation is intact to touch on the feet  Lab Results  Component Value Date   HGBA1C 6.8 (A) 02/11/2020       Assessment & Plan:  Insulin-requiring type 2 DM: The pattern of his cbg's indicates he needs some adjustment in his therapy. Hypoglycemia, due to insulin: this limits aggressiveness of glycemic control.  he declines to add non-insulin rx.  Patient Instructions  Please take these pump settings:  basal rate of 2.2 units/hr, 24 hrs per day.   mealtime bolus of 1 unit/2 grams carbohydrate.  However, subtract 14 units from your calculated lunch bolus.  continue correction bolus (which some people call "sensitivity," or "insulin sensitivity ratio," or just "isr") of 1 unit for each 20 by which your glucose exceeds 100.   Please come back for a follow-up appointment in 3 months.

## 2020-05-15 NOTE — Assessment & Plan Note (Signed)
Continue to work on weight with diet and exercise

## 2020-05-15 NOTE — Assessment & Plan Note (Signed)
Benefits from CPAP Plan- continue 11.6 cwp

## 2020-05-15 NOTE — Assessment & Plan Note (Signed)
Acting benign, but consider f/u imaging at next ov.

## 2020-06-16 ENCOUNTER — Encounter: Payer: Self-pay | Admitting: Physician Assistant

## 2020-06-16 ENCOUNTER — Other Ambulatory Visit: Payer: Self-pay

## 2020-06-16 ENCOUNTER — Ambulatory Visit: Payer: Medicare Other | Admitting: Physician Assistant

## 2020-06-16 DIAGNOSIS — Z86007 Personal history of in-situ neoplasm of skin: Secondary | ICD-10-CM

## 2020-06-16 NOTE — Progress Notes (Signed)
   Follow up Visit  Subjective  [name] is a 79 y.o. male who presents for the following: Follow-up (3 month follow up on left lower leg SCC insitu. ). Patient feels it is doing well. It did seem slow to heal but he did put vaseline on it for 3 days.   Objective  Well appearing patient in no apparent distress; mood and affect are within normal limits.  A focused examination was performed including left lower leg. Relevant physical exam findings are noted in the Assessment and Plan.   Objective  Left Ankle - Anterior: Dyspigmented scar. Appears clear.  Assessment & Plan  Personal history of in-situ neoplasm of skin Left Ankle - Anterior  Recheck in 6 months

## 2020-06-22 ENCOUNTER — Other Ambulatory Visit: Payer: Self-pay | Admitting: Family Medicine

## 2020-06-30 ENCOUNTER — Other Ambulatory Visit: Payer: Self-pay | Admitting: Family Medicine

## 2020-06-30 ENCOUNTER — Other Ambulatory Visit: Payer: Self-pay | Admitting: Physical Medicine & Rehabilitation

## 2020-07-01 ENCOUNTER — Encounter: Payer: Self-pay | Admitting: Endocrinology

## 2020-07-02 ENCOUNTER — Other Ambulatory Visit: Payer: Self-pay | Admitting: Endocrinology

## 2020-07-02 MED ORDER — TRULICITY 0.75 MG/0.5ML ~~LOC~~ SOAJ: 0.7500 mg | SUBCUTANEOUS | 3 refills | Status: DC

## 2020-07-04 ENCOUNTER — Other Ambulatory Visit: Payer: Self-pay | Admitting: Family Medicine

## 2020-07-04 ENCOUNTER — Other Ambulatory Visit: Payer: Self-pay | Admitting: Physical Medicine & Rehabilitation

## 2020-07-05 ENCOUNTER — Other Ambulatory Visit: Payer: Self-pay

## 2020-07-05 ENCOUNTER — Telehealth: Payer: Self-pay | Admitting: Endocrinology

## 2020-07-05 DIAGNOSIS — E0800 Diabetes mellitus due to underlying condition with hyperosmolarity without nonketotic hyperglycemic-hyperosmolar coma (NKHHC): Secondary | ICD-10-CM

## 2020-07-05 MED ORDER — INSULIN LISPRO 100 UNIT/ML ~~LOC~~ SOLN: SUBCUTANEOUS | 3 refills | Status: DC

## 2020-07-05 NOTE — Telephone Encounter (Signed)
Outpatient Medication Detail   Disp Refills Start End   insulin lispro (HUMALOG) 100 UNIT/ML injection 120 mL 3 07/05/2020    Sig: For use in pump, total of 120 units per day   Sent to pharmacy as: insulin lispro (HUMALOG) 100 UNIT/ML injection   Notes to Pharmacy: Requesting 1 year supply   E-Prescribing Status: Receipt confirmed by pharmacy (07/05/2020  3:36 PM EDT)

## 2020-07-05 NOTE — Telephone Encounter (Signed)
Medication Refill Request  Did you call your pharmacy and request this refill first? Yes  . If patient has not contacted pharmacy first, instruct them to do so for future refills.  . Remind them that contacting the pharmacy for their refill is the quickest method to get the refill.  . Refill policy also stated that it will take anywhere between 24-72 hours to receive the refill.    Name of medication? humalog  Is this a 90 day supply? yes  Name and location of pharmacy?  [id number] [address], [place] Phone:  [phone]  Fax:  [phone]

## 2020-07-06 ENCOUNTER — Ambulatory Visit: Payer: Medicare Other | Admitting: Orthopaedic Surgery

## 2020-07-06 ENCOUNTER — Encounter: Payer: Self-pay | Admitting: Orthopaedic Surgery

## 2020-07-06 ENCOUNTER — Ambulatory Visit: Payer: Self-pay

## 2020-07-06 VITALS — Ht 72.5 in | Wt 283.0 lb

## 2020-07-06 DIAGNOSIS — G8929 Other chronic pain: Secondary | ICD-10-CM | POA: Diagnosis not present

## 2020-07-06 DIAGNOSIS — M9261 Juvenile osteochondrosis of tarsus, right ankle: Secondary | ICD-10-CM

## 2020-07-06 DIAGNOSIS — M25562 Pain in left knee: Secondary | ICD-10-CM

## 2020-07-06 DIAGNOSIS — M7661 Achilles tendinitis, right leg: Secondary | ICD-10-CM | POA: Diagnosis not present

## 2020-07-06 MED ORDER — DICLOFENAC SODIUM 1 % EX GEL: 2.0000 g | Freq: Four times a day (QID) | CUTANEOUS | 3 refills | Status: DC

## 2020-07-06 NOTE — Progress Notes (Signed)
Office Visit Note   Patient: [name]           Date of Birth: 1941-07-07           MRN: [id number] Visit Date: 07/06/2020              Requested by: Libby Maw, MD [address] [place],  [place] [address] PCP: Libby Maw, MD   Assessment & Plan: Visit Diagnoses:  1. Achilles tendinitis, right leg   2. Chronic pain of left knee   3. Haglund's deformity, right     Plan: Impression is right heel Haglund's deformity and Achilles tendinitis and left knee posttraumatic calcification to the mid patella tendon.  In regards to the heel, he is not symptomatic enough to warrant placing him in a cam walker.  I provided him with Achilles stretching handout and a prescription for Voltaren gel.  If his symptoms worsen he will come back in and see [place].  In regards to his knee, we will have discussed that he could also use the Voltaren gel here as well.  Otherwise, there is really not another conservative treatment option.  He will follow-up with [place] as needed.  Follow-Up Instructions: Return if symptoms worsen or fail to improve.   Orders:  Orders Placed This Encounter  Procedures  . XR KNEE 3 VIEW LEFT  . XR Os Calcis Right   Meds ordered this encounter  Medications  . diclofenac Sodium (VOLTAREN) 1 % GEL    Sig: Apply 2 g topically 4 (four) times daily.    Dispense:  150 g    Refill:  3      Procedures: No procedures performed   Clinical Data: No additional findings.   Subjective: Chief Complaint  Patient presents with  . Left Knee - Pain  . Right Foot - Pain    HEEL    HPI patient is a very pleasant 79 year old gentleman who comes in today with left knee and right heel pain.  In regards to his left knee, he noticed a palpable mass to the mid patella tendon about 2 months ago.  It has not changed in size over the past 2 months.  About a week ago, he noticed pain to the area which only occurred going from a seated to standing position.  The  only injury he notes is a fall to the anterior aspect of both knees which occurred about a year ago.  Other issue he brings up today is right heel pain for the past 2 months.  No injury there.  The pain has progressively improved and is only rated as a 1-2 out of 10 on the pain scale.  All the pain is retrocalcaneal.  Walking seems to make this worse.  He has tried lidocaine cream with relief of symptoms.  Review of Systems as detailed in HPI.  All others reviewed and are negative.   Objective: Vital Signs: Ht 6' 0.5" (1.842 m)   Wt 283 lb (128.4 kg)   BMI 37.85 kg/m   Physical Exam well-developed well-nourished gentleman in no acute distress.  Alert and oriented x3.  Ortho Exam examination of the left knee shows a grape-sized bony mass to the mid patella.  This is nontender.  No skin changes.  Otherwise, knee exam is normal.  Right heel shows a palpable area to the distal Achilles.  No tenderness to the Achilles bursa.  He has mild tenderness along the mid and distal Achilles.  Dorsiflexion to  about 15 degrees.  He is neurovascularly intact distally.  Specialty Comments:  No specialty comments available.  Imaging: XR KNEE 3 VIEW LEFT  Result Date: 07/06/2020 X-rays show minimal joint space narrowing.  He does have a prominent area to the mid patella tendon that is seen on the lateral view  XR Os Calcis Right  Result Date: 07/06/2020 X-rays show a retrocalcaneal spur    PMFS History: Patient Active Problem List   Diagnosis Date Noted  . 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 sciatica 03/15/2020  . Diabetic peripheral neuropathy ([place]) 12/16/2019  . Myalgia 12/16/2019  . Transient visual disturbance 10/20/2019  . Elevated TSH 10/09/2019  . Stage 3a chronic kidney disease 10/09/2019  . Neuropathic pain 09/15/2019  . Elevated LDL cholesterol level 07/10/2019  . Folic acid deficiency 96/22/2979  . Iron deficiency 07/10/2019  . History of fall within past 90 days 07/10/2019  .  History of gout 07/10/2019  . Skin lesion of left leg 07/10/2019  . Frequent falls 06/18/2019  . Gait disturbance 06/18/2019  . Fall on same level from slipping, tripping, or stumbling 06/09/2019  . Lung nodules 05/08/2019  . Sleep disturbance 05/01/2019  . Fall (on)(from) sidewalk curb, initial encounter 04/18/2019  . Vitamin D deficiency 04/18/2019  . 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 Excessive cerumen in left ear canal 10/29/2018  . Trigger finger, right ring finger 08/13/2018  . Chest wall muscle strain 07/30/2018  . Need for influenza vaccination 07/30/2018  . Grieving 06/25/2018  . ETD (Eustachian tube dysfunction), left 06/25/2018  . Unilateral primary osteoarthritis, right knee 11/17/2016  . Complex tear of lateral meniscus of right knee as current injury 10/26/2016  . Radiculitis, lumbosacral 06/30/2016  . Diabetes ([place]) 01/01/2016  . Solitary pulmonary nodule 12/30/2015  . Urolithiasis 02/23/2015  . Hearing loss 11/25/2014  . Paresthesia 09/07/2014  . Routine general medical examination at a health care facility 07/07/2014  . Disturbance of skin sensation 12/26/2013  . UTI (urinary tract infection) 03/12/2013  . Screening for prostate cancer 02/14/2012  . Right knee pain 11/15/2011  . Encounter for long-term (current) use of other medications 01/17/2011  . Special screening examination for neoplasm of prostate 01/17/2011  . MYCOSIS FUNGOIDES 12/29/2009  . HEARING LOSS 09/30/2009  . ECZEMA 09/30/2009  . VITAMIN B12 DEFICIENCY 09/10/2009  . MORBID OBESITY 09/10/2009  . SYNCOPE 05/03/2009  . DIASTOLIC DYSFUNCTION 89[birth year]/1941  . CARDIAC MURMUR, SYSTOLIC 74/05/1447  . Pancytopenia ([place]) 05/12/2008  . Disorder resulting from impaired renal function 05/12/2008  . EDEMA 05/12/2008  . Obstructive sleep apnea 11/04/2007  . BACK PAIN, LUMBAR 10/23/2007  . Dyslipidemia 05/20/2007  . 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 Essential hypertension 05/20/2007   Past Medical History:  Diagnosis Date    . Allergy   . Anal fissure   . Anemia, unspecified   . Arthritis    Spinal OA  . BACK PAIN, LUMBAR 10/23/2007  . CARDIAC MURMUR, SYSTOLIC 10/30/5629  . DIABETES MELLITUS, TYPE I 05/20/2007  . DIASTOLIC DYSFUNCTION 4/97/0263  . 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 Duodenitis   . Edema 05/12/2008  . Esophageal stricture   . GERD (gastroesophageal reflux disease)   . 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 Gynecomastia   . Hepatic steatosis   . HYPERLIPIDEMIA 05/20/2007  . Hypertension   . Hypogonadism male   . Morbid obesity ([place]) 09/10/2009  . OBSTRUCTIVE SLEEP APNEA 11/04/2007  . Personal history of colonic polyps   . RENAL INSUFFICIENCY 05/12/2008  . Sleep apnea  uses cpap  . Squamous cell carcinoma of skin 02/16/2020   left lower leg, anterior-cx3,74fu  . Urolithiasis     Family History  Problem Relation Age of Onset  . Colon cancer Brother 42  . Colon polyps Brother   . Cancer Brother        lung cancer, deceased  . Other Mother        MVA, deceased 65s  . Healthy Daughter   . Healthy Son   . Rectal cancer Neg Hx   . Stomach cancer Neg Hx     Past Surgical History:  Procedure Laterality Date  . Abdominal US  09/1997  . arm fracture Left 1958   with hardware  . Colon cancer screening    . COLONOSCOPY  08/18/2004   diverticulitis  . COLONOSCOPY  09/24/2009  . ELECTROCARDIOGRAM  02/2006  . FLEXIBLE SIGMOIDOSCOPY  03/04/2001   polyps, anal fissure  . GANGLION CYST EXCISION Left 1980  . Lower Arterial  04/13/2004  . POLYPECTOMY    . Rest Cardiolite  03/19/2003   Social History   Occupational History    Employer: [employer]  Tobacco Use  . Smoking status: Former Smoker    Packs/day: 2.00    Years: 31.00    Pack years: 62.00    Types: Cigarettes    Quit date: 10/23/1982    Years since quitting: 37.7  . Smokeless tobacco: Never Used  Substance and Sexual Activity  . Alcohol use: No    Alcohol/week: 0.0 standard drinks  . Drug use: No  . Sexual activity:  Not on file

## 2020-07-12 ENCOUNTER — Encounter: Payer: Self-pay | Admitting: Family Medicine

## 2020-07-12 ENCOUNTER — Ambulatory Visit: Payer: Medicare Other | Admitting: Family Medicine

## 2020-07-12 ENCOUNTER — Other Ambulatory Visit: Payer: Self-pay

## 2020-07-12 VITALS — BP 142/70 | HR 76 | Temp 97.1°F | Ht 72.0 in | Wt 289.4 lb

## 2020-07-12 DIAGNOSIS — I10 Essential (primary) hypertension: Secondary | ICD-10-CM | POA: Diagnosis not present

## 2020-07-12 DIAGNOSIS — E7800 Pure hypercholesterolemia, unspecified: Secondary | ICD-10-CM | POA: Diagnosis not present

## 2020-07-12 DIAGNOSIS — E559 Vitamin D deficiency, unspecified: Secondary | ICD-10-CM

## 2020-07-12 DIAGNOSIS — E538 Deficiency of other specified B group vitamins: Secondary | ICD-10-CM | POA: Diagnosis not present

## 2020-07-12 DIAGNOSIS — Z8739 Personal history of other diseases of the musculoskeletal system and connective tissue: Secondary | ICD-10-CM | POA: Diagnosis not present

## 2020-07-12 DIAGNOSIS — N1831 Chronic kidney disease, stage 3a: Secondary | ICD-10-CM

## 2020-07-12 DIAGNOSIS — R42 Dizziness and giddiness: Secondary | ICD-10-CM | POA: Diagnosis not present

## 2020-07-12 LAB — URINALYSIS, ROUTINE W REFLEX MICROSCOPIC
Bilirubin Urine: NEGATIVE
Hgb urine dipstick: NEGATIVE
Ketones, ur: NEGATIVE
Nitrite: NEGATIVE
RBC / HPF: NONE SEEN (ref 0–?)
Specific Gravity, Urine: 1.015 (ref 1.000–1.030)
Total Protein, Urine: NEGATIVE
Urine Glucose: NEGATIVE
Urobilinogen, UA: 1 (ref 0.0–1.0)
pH: 6 (ref 5.0–8.0)

## 2020-07-12 LAB — BASIC METABOLIC PANEL
BUN: 19 mg/dL (ref 6–23)
CO2: 31 mEq/L (ref 19–32)
Calcium: 8.7 mg/dL (ref 8.4–10.5)
Chloride: 105 mEq/L (ref 96–112)
Creatinine, Ser: 1.51 mg/dL — ABNORMAL HIGH (ref 0.40–1.50)
GFR: 44.83 mL/min — ABNORMAL LOW (ref >60.00)
Glucose, Bld: 135 mg/dL — ABNORMAL HIGH (ref 70–99)
Potassium: 3.8 mEq/L (ref 3.5–5.1)
Sodium: 142 mEq/L (ref 135–145)

## 2020-07-12 LAB — URIC ACID: Uric Acid, Serum: 5.2 mg/dL (ref 4.0–7.8)

## 2020-07-12 LAB — VITAMIN D 25 HYDROXY (VIT D DEFICIENCY, FRACTURES): VITD: 31.07 ng/mL (ref 30.00–100.00)

## 2020-07-12 LAB — LDL CHOLESTEROL, DIRECT: Direct LDL: 63 mg/dL

## 2020-07-12 LAB — VITAMIN B12: Vitamin B-12: 270 pg/mL (ref 211–911)

## 2020-07-12 NOTE — Progress Notes (Signed)
Established Patient Office Visit  Subjective:  Patient ID: [name], male    DOB: Apr 24, 1941  Age: 79 y.o. MRN: [id number]  CC:  Chief Complaint  Patient presents with  . Follow-up    3 moth follow up on BP, pt states that sometimes he will feel a little dizzy.     HPI Hanson Medeiros presents for hypertension, elevated cholesterol, history of gout, vitamin D and B12 deficiency and CKD.  Blood pressure has been well controlled with carvedilol and low-dose Lasix.  Patient feels lightheaded when standing at times.  Denies any spinning sensation or the sensation of movement.  Feels as though his he is drinking copious amounts of water.  No longer supplementing with vitamin D and B12.  He is taking a multivitamin.  Type 2 diabetes is well controlled through endocrinology.  He has a great deal of difficulty losing weight.  Continues with allopurinol for gout.  Past Medical History:  Diagnosis Date  . Allergy   . Anal fissure   . Anemia, unspecified   . Arthritis    Spinal OA  . BACK PAIN, LUMBAR 10/23/2007  . CARDIAC MURMUR, SYSTOLIC 10/23/9145  . DIABETES MELLITUS, TYPE I 05/20/2007  . DIASTOLIC DYSFUNCTION 06/20/5620  . 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 Duodenitis   . Edema 05/12/2008  . Esophageal stricture   . GERD (gastroesophageal reflux disease)   . 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 Gynecomastia   . Hepatic steatosis   . HYPERLIPIDEMIA 05/20/2007  . Hypertension   . Hypogonadism male   . Morbid obesity ([place]) 09/10/2009  . OBSTRUCTIVE SLEEP APNEA 11/04/2007  . Personal history of colonic polyps   . RENAL INSUFFICIENCY 05/12/2008  . Sleep apnea    uses cpap  . Squamous cell carcinoma of skin 02/16/2020   left lower leg, anterior-cx3,33f  . Urolithiasis     Past Surgical History:  Procedure Laterality Date  . Abdominal [place] 09/1997  . arm fracture Left 1958   with hardware  . Colon cancer screening    . COLONOSCOPY  08/18/2004   diverticulitis  . COLONOSCOPY   09/24/2009  . ELECTROCARDIOGRAM  02/2006  . FLEXIBLE SIGMOIDOSCOPY  03/04/2001   polyps, anal fissure  . GANGLION CYST EXCISION Left 1980  . Lower Arterial  04/13/2004  . POLYPECTOMY    . Rest Cardiolite  03/19/2003    Family History  Problem Relation Age of Onset  . Colon cancer Brother 521 . Colon polyps Brother   . Cancer Brother        lung cancer, deceased  . Other Mother        MVA, deceased 529s . Healthy Daughter   . Healthy Son   . Rectal cancer Neg Hx   . Stomach cancer Neg Hx     Social History   Socioeconomic History  . Marital status: Married    Spouse name: Not on file  . Number of children: Not on file  . Years of education: Not on file  . Highest education level: Not on file  Occupational History    Employer: [employer]  Tobacco Use  . Smoking status: Former Smoker    Packs/day: 2.00    Years: 31.00    Pack years: 62.00    Types: Cigarettes    Quit date: 10/23/1982    Years since quitting: 37.7  . Smokeless tobacco: Never Used  Substance and Sexual Activity  . Alcohol use: No  Alcohol/week: 0.0 standard drinks  . Drug use: No  . Sexual activity: Not on file  Other Topics Concern  . Not on file  Social History Narrative   Lives with wife in a one story home.  Has a son and a daughter.     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     Education: 2 years of college.   Social Determinants of Health   Financial Resource Strain: Low Risk   . Difficulty of Paying Living Expenses: Not hard at all  Food Insecurity: No Food Insecurity  . Worried About Charity fundraiser in the Last Year: Never true  . Ran Out of Food in the Last Year: Never true  Transportation Needs: No Transportation Needs  . Lack of Transportation (Medical): No  . Lack of Transportation (Non-Medical): No  Physical Activity:   . Days of Exercise per Week: Not on file  . Minutes of Exercise per Session: Not on file  Stress:   . Feeling of Stress : Not on  file  Social Connections:   . Frequency of Communication with Friends and Family: Not on file  . Frequency of Social Gatherings with Friends and Family: Not on file  . Attends Religious Services: Not on file  . Active Member of Clubs or Organizations: Not on file  . Attends Archivist Meetings: Not on file  . Marital Status: Not on file  Intimate Partner Violence:   . Fear of Current or Ex-Partner: Not on file  . Emotionally Abused: Not on file  . Physically Abused: Not on file  . Sexually Abused: Not on file    Outpatient Medications Prior to Visit  Medication Sig Dispense Refill  . Accu-Chek FastClix Lancets MISC USE TO CHECK BLOOD SUGAR UP TO 6 TIMES DAILY 612 each 3  . alfuzosin (UROXATRAL) 10 MG 24 hr tablet TAKE 1 TABLET BY MOUTH  DAILY WITH BREAKFAST 90 tablet 3  . allopurinol (ZYLOPRIM) 300 MG tablet TAKE 1 TABLET BY MOUTH  DAILY 90 tablet 3  . amitriptyline (ELAVIL) 25 MG tablet TAKE 1 TABLET(25 MG) BY MOUTH AT BEDTIME 30 tablet 1  . aspirin (BAYER LOW STRENGTH) 81 MG EC tablet Take 81 mg by mouth daily.      . Blood Glucose Monitoring Suppl (ACCU-CHEK AVIVA PLUS) w/Device KIT Use as directed 1 kit 0  . carvedilol (COREG) 3.125 MG tablet TAKE 1/2 TABLET BY MOUTH 2  TIMES DAILY WITH MEALS 90 tablet 3  . Continuous Blood Gluc Sensor (DEXCOM G6 SENSOR) MISC Inject 1 Device into the skin as directed. Apply to skin SQ every 10 days 9 each 3  . cycloSPORINE (RESTASIS) 0.05 % ophthalmic emulsion Place 1 drop into both eyes 2 (two) times daily.      . diclofenac Sodium (VOLTAREN) 1 % GEL Apply 2 g topically 4 (four) times daily. 150 g 3  . Dulaglutide (TRULICITY) 4.33 IR/5.1OA SOPN Inject 0.75 mg into the skin once a week. 6 mL 3  . DULoxetine (CYMBALTA) 60 MG capsule TAKE 1 CAPSULE BY MOUTH  DAILY 30 capsule 11  . fluticasone (FLONASE) 50 MCG/ACT nasal spray Place 1 spray into the nose daily as needed for allergies.     . folic acid (FOLVITE) 1 MG tablet Take 2 mg by mouth 2  (two) times daily. 4 tabs daily    . furosemide (LASIX) 20 MG tablet TAKE 1 TABLET BY MOUTH  DAILY 90 tablet 3  . glucose blood (  ACCU-CHEK AVIVA PLUS) test strip USE TO TEST BLOOD SUGAR 6  TIMES PER DAY; E11.9 600 each 11  . Insulin Infusion Pump Supplies (AUTOSOFT XC INFUSION SET) MISC Inject 1 Act into the skin every other day. Use with 80m cannula and 23 inch tubing. *5 boxes (90 day supply) 5 each 3  . Insulin Infusion Pump Supplies (T:SLIM INSULIN CARTRIDGE 3ML) MISC Use with insulin pump, fill once every 2 days. 5 each 3  . insulin lispro (HUMALOG) 100 UNIT/ML injection For use in pump, total of 120 units per day 120 mL 3  . mometasone (ELOCON) 0.1 % lotion APPLY TOPICALLY DAILY 60 mL 11  . Multiple Vitamins-Minerals (CENTRUM SILVER PO) Take 1 tablet by mouth daily.      . Omega-3 Fatty Acids (FISH OIL) 1200 MG CAPS Take 1,200 mg by mouth daily.     .Marland Kitchenomeprazole (PRILOSEC) 20 MG capsule Take 1 capsule (20 mg total) by mouth daily as needed. 90 capsule 2  . rosuvastatin (CRESTOR) 40 MG tablet TAKE 1 TABLET BY MOUTH AT  BEDTIME 90 tablet 3  . vitamin B-12 (CYANOCOBALAMIN) 1000 MCG tablet Take 1,000 mcg by mouth daily.    . Vitamin D, Ergocalciferol, (DRISDOL) 1.25 MG (50000 UT) CAPS capsule Take 1 capsule (50,000 Units total) by mouth every 7 (seven) days. 5 capsule 6  . amitriptyline (ELAVIL) 50 MG tablet Take 0.5 tablets (25 mg total) by mouth at bedtime. 90 tablet 3   No facility-administered medications prior to visit.    Allergies  Allergen Reactions  . Atorvastatin Other (See Comments)    unknown  . Niacin     REACTION: Severe heartburn  . Pioglitazone     REACTION: Edema    ROS Review of Systems  Constitutional: Negative.   Eyes: Negative for photophobia and visual disturbance.  Respiratory: Negative.   Cardiovascular: Negative.  Negative for chest pain and palpitations.  Gastrointestinal: Negative.   Endocrine: Negative for polyphagia and polyuria.  Genitourinary:  Negative.   Musculoskeletal: Negative for gait problem and joint swelling.  Skin: Negative for pallor and rash.  Allergic/Immunologic: Negative for immunocompromised state.  Neurological: Positive for light-headedness. Negative for dizziness and headaches.  Hematological: Does not bruise/bleed easily.  Psychiatric/Behavioral: Negative.       Objective:    Physical Exam Constitutional:      General: He is not in acute distress.    Appearance: Normal appearance. He is not ill-appearing, toxic-appearing or diaphoretic.  HENT:     Head: Normocephalic and atraumatic.     Right Ear: External ear normal.     Left Ear: External ear normal.  Eyes:     General: No scleral icterus.       Right eye: No discharge.        Left eye: No discharge.     Extraocular Movements: Extraocular movements intact.     Conjunctiva/sclera: Conjunctivae normal.     Pupils: Pupils are equal, round, and reactive to light.  Cardiovascular:     Rate and Rhythm: Normal rate and regular rhythm.  Pulmonary:     Effort: Pulmonary effort is normal.     Breath sounds: Normal breath sounds.  Musculoskeletal:     Cervical back: No rigidity or tenderness.     Right lower leg: Edema present.     Left lower leg: Edema present.  Lymphadenopathy:     Cervical: No cervical adenopathy.  Skin:    General: Skin is warm and dry.  Neurological:     Mental Status: He is alert and oriented to person, place, and time. Mental status is at baseline.  Psychiatric:        Mood and Affect: Mood normal.        Behavior: Behavior normal.     BP (!) 142/70   Pulse 76   Temp (!) 97.1 F (36.2 C) (Tympanic)   Ht 6' (1.829 m)   Wt 289 lb 6.4 oz (131.3 kg)   SpO2 97%   BMI 39.25 kg/m  Wt Readings from Last 3 Encounters:  07/12/20 289 lb 6.4 oz (131.3 kg)  07/06/20 283 lb (128.4 kg)  05/12/20 281 lb (127.5 kg)     Health Maintenance Due  Topic Date Due  . Hepatitis C Screening  Never done  . OPHTHALMOLOGY EXAM   06/20/2019  . COLONOSCOPY  05/04/2020  . INFLUENZA VACCINE  05/23/2020  . URINE MICROALBUMIN  07/09/2020    There are no preventive care reminders to display for this patient.  Lab Results  Component Value Date   TSH 2.99 10/09/2019   Lab Results  Component Value Date   WBC 4.5 04/09/2020   HGB 14.9 04/09/2020   HCT 44.0 04/09/2020   MCV 92.7 04/09/2020   PLT 116.0 (L) 04/09/2020   Lab Results  Component Value Date   NA 140 04/09/2020   K 3.7 04/09/2020   CO2 32 04/09/2020   GLUCOSE 111 (H) 04/09/2020   BUN 19 04/09/2020   CREATININE 1.49 04/09/2020   BILITOT 0.6 10/09/2019   ALKPHOS 85 10/09/2019   AST 15 10/09/2019   ALT 18 10/09/2019   PROT 6.4 10/09/2019   ALBUMIN 4.1 10/09/2019   CALCIUM 9.2 04/09/2020   ANIONGAP 8 11/28/2014   GFR 45.56 (L) 04/09/2020   Lab Results  Component Value Date   CHOL 116 07/10/2019   Lab Results  Component Value Date   HDL 32.20 (L) 07/10/2019   Lab Results  Component Value Date   LDLCALC 57 07/10/2019   Lab Results  Component Value Date   TRIG 137.0 07/10/2019   Lab Results  Component Value Date   CHOLHDL 4 07/10/2019   Lab Results  Component Value Date   HGBA1C 6.4 (A) 05/12/2020      Assessment & Plan:   Problem List Items Addressed This Visit      Cardiovascular and Mediastinum   Essential hypertension - Primary   Relevant Orders   Basic metabolic panel   CBC   Urinalysis, Routine w reflex microscopic     Genitourinary   Stage 3a chronic kidney disease   Relevant Orders   Basic metabolic panel     Other   Vitamin D deficiency   Relevant Orders   VITAMIN D 25 Hydroxy (Vit-D Deficiency, Fractures)   Elevated LDL cholesterol level   Relevant Orders   LDL cholesterol, direct   History of gout   Relevant Orders   Uric acid    Other Visit Diagnoses    B12 deficiency       Relevant Orders   Vitamin B12   Lightheaded       Relevant Orders   Urinalysis, Routine w reflex microscopic    Orthostatic vital signs      No orders of the defined types were placed in this encounter.   Follow-up: No follow-ups on file.  Patient did not tilt with orthostatic checks.  Encouraged continue copious fluid intake.  For now, continue current doses of medications appropriate dosage  changes will be made after lab results.  Libby Maw, MD

## 2020-07-13 ENCOUNTER — Encounter: Payer: Self-pay | Admitting: Family Medicine

## 2020-07-13 LAB — CBC
HCT: 40.8 % (ref 39.0–52.0)
Hemoglobin: 13.6 g/dL (ref 13.0–17.0)
MCHC: 33.4 g/dL (ref 30.0–36.0)
MCV: 93.4 fl (ref 78.0–100.0)
Platelets: 130 10*3/uL — ABNORMAL LOW (ref 150.0–400.0)
RBC: 4.37 Mil/uL (ref 4.22–5.81)
RDW: 15 % (ref 11.5–15.5)
WBC: 5.3 10*3/uL (ref 4.0–10.5)

## 2020-07-20 ENCOUNTER — Encounter: Payer: Self-pay | Admitting: Physician Assistant

## 2020-07-20 ENCOUNTER — Ambulatory Visit: Payer: Medicare Other | Admitting: Orthopaedic Surgery

## 2020-07-20 DIAGNOSIS — M25562 Pain in left knee: Secondary | ICD-10-CM | POA: Diagnosis not present

## 2020-07-20 DIAGNOSIS — G8929 Other chronic pain: Secondary | ICD-10-CM | POA: Diagnosis not present

## 2020-07-20 MED ORDER — NAPROXEN 500 MG PO TABS: 500.0000 mg | ORAL_TABLET | Freq: Two times a day (BID) | ORAL | 2 refills | Status: DC

## 2020-07-20 NOTE — Progress Notes (Signed)
Office Visit Note   Patient: [name]           Date of Birth: [birth year]           MRN: [id number] Visit Date: 07/20/2020              Requested by: Libby Maw, MD [address] [place],  [place] [address] PCP: Libby Maw, MD   Assessment & Plan: Visit Diagnoses:  1. Chronic pain of left knee     Plan: Impression is left distal hamstring strain.  This is continuing to improve so he will continue to rest for now.  Have called in NSAIDs to take regularly and he will continue with his Ace bandage as needed.  He will use ice and heat as needed.  He will follow-up with [place] as needed.  Follow-Up Instructions: Return if symptoms worsen or fail to improve.   Orders:  No orders of the defined types were placed in this encounter.  Meds ordered this encounter  Medications   naproxen (NAPROSYN) 500 MG tablet    Sig: Take 1 tablet (500 mg total) by mouth 2 (two) times daily with a meal.    Dispense:  60 tablet    Refill:  2      Procedures: No procedures performed   Clinical Data: No additional findings.   Subjective: Chief Complaint  Patient presents with   Left Knee - Pain    HPI patient is a pleasant 79 year old gentleman who comes in today with a new injury to the left knee.  This past Thursday he was going from a seated to standing position when he felt a pop and pain to the back of the distal thigh.  He was having increased pain with ambulation for which she was using a cane.  He has been resting, using an Ace bandage and taking occasional Advil with moderate relief of symptoms.  Review of Systems as detailed in HPI.  All others reviewed and are negative.   Objective: Vital Signs: There were no vitals taken for this visit.  Physical Exam well-developed well-nourished gentleman in no acute distress.  Alert oriented x3.  Ortho Exam examination of the left lower extremity reveals mild tenderness to the distal hamstring.  No  ecchymosis.  No pain or weakness with flexion or extension of the knee.  No joint line tenderness.  Calf is soft and nontender.  He is neurovascular intact distally.  Specialty Comments:  No specialty comments available.  Imaging: No new imaging   PMFS History: Patient Active Problem List   Diagnosis Date Noted   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 sciatica 03/15/2020   Diabetic peripheral neuropathy ([place]) 12/16/2019   Myalgia 12/16/2019   Transient visual disturbance 10/20/2019   Elevated TSH 10/09/2019   Stage 3a chronic kidney disease 10/09/2019   Neuropathic pain 09/15/2019   Elevated LDL cholesterol level 82/50/0370   Folic acid deficiency 48/88/9169   Iron deficiency 07/10/2019   History of fall within past 90 days 07/10/2019   History of gout 07/10/2019   Skin lesion of left leg 07/10/2019   Frequent falls 06/18/2019   Gait disturbance 06/18/2019   Fall on same level from slipping, tripping, or stumbling 06/09/2019   Lung nodules 05/08/2019   Sleep disturbance 05/01/2019   Fall (on)(from) sidewalk curb, initial encounter 04/18/2019   Vitamin D deficiency 04/18/2019   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   Excessive cerumen in left ear canal 10/29/2018   Trigger finger, right ring  finger 08/13/2018   Chest wall muscle strain 07/30/2018   Need for influenza vaccination 07/30/2018   Grieving 06/25/2018   ETD (Eustachian tube dysfunction), left 06/25/2018   Unilateral primary osteoarthritis, right knee 11/17/2016   Complex tear of lateral meniscus of right knee as current injury 10/26/2016   Radiculitis, lumbosacral 06/30/2016   Diabetes ([place]) 01/01/2016   Solitary pulmonary nodule 12/30/2015   Urolithiasis 02/23/2015   Hearing loss 11/25/2014   Paresthesia 09/07/2014   Routine general medical examination at a health care facility 07/07/2014   Disturbance of skin sensation 12/26/2013   UTI (urinary tract infection) 03/12/2013    Screening for prostate cancer 02/14/2012   Right knee pain 11/15/2011   Encounter for long-term (current) use of other medications 01/17/2011   Special screening examination for neoplasm of prostate 01/17/2011   MYCOSIS FUNGOIDES 12/29/2009   HEARING LOSS 09/30/2009   ECZEMA 09/30/2009   VITAMIN B12 DEFICIENCY 09/10/2009   MORBID OBESITY 09/10/2009   SYNCOPE 29/93/7169   DIASTOLIC DYSFUNCTION 67/89/3810   CARDIAC MURMUR, SYSTOLIC 17/51/0258   Pancytopenia ([place]) 05/12/2008   Disorder resulting from impaired renal function 05/12/2008   EDEMA 05/12/2008   Obstructive sleep apnea 11/04/2007   BACK PAIN, LUMBAR 10/23/2007   Dyslipidemia 05/20/2007   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   Essential hypertension 05/20/2007   Past Medical History:  Diagnosis Date   Allergy    Anal fissure    Anemia, unspecified    Arthritis    Spinal OA   BACK PAIN, LUMBAR 10/23/2007   CARDIAC MURMUR, SYSTOLIC 02/21/7781   DIABETES MELLITUS, TYPE I 02/13/5360   DIASTOLIC DYSFUNCTION 4/43/1540   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[place])    Duodenitis    Edema 05/12/2008   Esophageal stricture    GERD (gastroesophageal reflux disease)    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   Gynecomastia    Hepatic steatosis    HYPERLIPIDEMIA 05/20/2007   Hypertension    Hypogonadism male    Morbid obesity ([place]) 09/10/2009   OBSTRUCTIVE SLEEP APNEA 11/04/2007   Personal history of colonic polyps    RENAL INSUFFICIENCY 05/12/2008   Sleep apnea    uses cpap   Squamous cell carcinoma of skin 02/16/2020   left lower leg, anterior-cx3,72fu   Urolithiasis     Family History  Problem Relation Age of Onset   Colon cancer Brother 46   Colon polyps Brother    Lung cancer Brother    Other Mother        MVA, deceased 37s   Healthy Daughter    Healthy Son    Rectal cancer Neg Hx    Stomach cancer Neg Hx     Past Surgical History:  Procedure Laterality Date   Abdominal US  09/1997   arm  fracture Left 1958   with hardware   Colon cancer screening     COLONOSCOPY  08/18/2004   diverticulitis   COLONOSCOPY  09/24/2009   ELECTROCARDIOGRAM  02/2006   FLEXIBLE SIGMOIDOSCOPY  03/04/2001   polyps, anal fissure   GANGLION CYST EXCISION Left 1980   Lower Arterial  04/13/2004   POLYPECTOMY     Rest Cardiolite  03/19/2003   Social History   Occupational History    Employer: [employer]  Tobacco Use   Smoking status: Former Smoker    Packs/day: 2.00    Years: 31.00    Pack years: 62.00    Types: Cigarettes    Quit date: 10/23/1982    Years since quitting: 37.7   Smokeless  tobacco: Never Used  Substance and Sexual Activity   Alcohol use: No    Alcohol/week: 0.0 standard drinks   Drug use: No   Sexual activity: Not on file

## 2020-07-23 ENCOUNTER — Encounter: Payer: Self-pay | Admitting: Gastroenterology

## 2020-07-23 ENCOUNTER — Ambulatory Visit: Payer: Medicare Other | Admitting: Gastroenterology

## 2020-07-23 ENCOUNTER — Telehealth: Payer: Self-pay

## 2020-07-23 ENCOUNTER — Ambulatory Visit (INDEPENDENT_AMBULATORY_CARE_PROVIDER_SITE_OTHER): Payer: Medicare Other | Admitting: Gastroenterology

## 2020-07-23 VITALS — BP 140/66 | HR 85 | Ht 72.0 in | Wt 285.0 lb

## 2020-07-23 DIAGNOSIS — Z86010 Personal history of colonic polyps: Secondary | ICD-10-CM | POA: Diagnosis not present

## 2020-07-23 DIAGNOSIS — G4733 Obstructive sleep apnea (adult) (pediatric): Secondary | ICD-10-CM

## 2020-07-23 DIAGNOSIS — N1831 Chronic kidney disease, stage 3a: Secondary | ICD-10-CM | POA: Diagnosis not present

## 2020-07-23 DIAGNOSIS — Z6838 Body mass index (BMI) 38.0-38.9, adult: Secondary | ICD-10-CM

## 2020-07-23 MED ORDER — PLENVU 140 G PO SOLR: 140.0000 g | ORAL | 0 refills | Status: DC

## 2020-07-23 NOTE — Progress Notes (Signed)
[facility] Consult Note:  History: [name] 07/23/2020  Referring provider: Libby Maw, MD  Reason for consult/chief complaint: Colon Cancer Screening (Discuss recall Colon, Pt has family hx of colon cancer. Pt's brother had when he was 79 yrs old. Pt has a hx of polyps. 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 Deatra Ina for colonoscopy in July 2016.  That was a complete exam with a good preparation (Suprep), diverticulosis and 2 subcentimeter adenomatous polyps.  Nikolos's brother had over 58 colon polyps, 1 of which was cancerous requiring surgery, in his late 47s.  [name] has no digestive complaints and does not see any blood in his bowel movements. He denies dysphagia odynophagia vomiting or weight change. He has also had better overall glucose control since going on an insulin pump, and he is very pleased with the care that Dr. Loanne Drilling has provided.  ROS:  Review of Systems  Constitutional: Negative for appetite change and unexpected weight change.  HENT: Negative for mouth sores and voice change.   Eyes: Negative for pain and redness.  Respiratory: Negative for cough and shortness of breath.   Cardiovascular: Negative for chest pain and palpitations.  Genitourinary: Negative for dysuria and hematuria.  Musculoskeletal: Positive for arthralgias and back pain. Negative for myalgias.  Skin: Negative for pallor and rash.  Neurological: Negative for weakness and headaches.  Hematological: Negative for adenopathy.     Past Medical History: Past Medical History:  Diagnosis Date  . Allergy   . Anal fissure   . Anemia, unspecified   . Arthritis    Spinal OA  . BACK PAIN, LUMBAR 10/23/2007  . CARDIAC MURMUR, SYSTOLIC 11/29/8339  . DIABETES MELLITUS, TYPE I 05/20/2007  . DIASTOLIC DYSFUNCTION 9/62/2297  . 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 Duodenitis   . Edema  05/12/2008  . Esophageal stricture   . GERD (gastroesophageal reflux disease)   . 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 Gynecomastia   . Hepatic steatosis   . HYPERLIPIDEMIA 05/20/2007  . Hypertension   . Hypogonadism male   . Morbid obesity ([place]) 09/10/2009  . OBSTRUCTIVE SLEEP APNEA 11/04/2007  . Personal history of colonic polyps   . RENAL INSUFFICIENCY 05/12/2008  . Sleep apnea    uses cpap  . Squamous cell carcinoma of skin 02/16/2020   left lower leg, anterior-cx3,80f  . Urolithiasis      Past Surgical History: Past Surgical History:  Procedure Laterality Date  . Abdominal [place] 09/1997  . arm fracture Left 1958   with hardware  . Colon cancer screening    . COLONOSCOPY  08/18/2004   diverticulitis  . COLONOSCOPY  09/24/2009  . ELECTROCARDIOGRAM  02/2006  . FLEXIBLE SIGMOIDOSCOPY  03/04/2001   polyps, anal fissure  . GANGLION CYST EXCISION Left 1980  . Lower Arterial  04/13/2004  . POLYPECTOMY    . Rest Cardiolite  03/19/2003     Family History: Family History  Problem Relation Age of Onset  . Colon cancer Brother 586 . Colon polyps Brother   . Lung cancer Brother   . Other Mother        MVA, deceased 564s . Healthy Daughter   . Healthy Son   . Rectal cancer Neg Hx   . Stomach cancer Neg Hx     Social History: Social History   Socioeconomic History  . Marital status: Married  Spouse name: Not on file  . Number of children: Not on file  . Years of education: Not on file  . Highest education level: Not on file  Occupational History    Employer: [employer]  Tobacco Use  . Smoking status: Former Smoker    Packs/day: 2.00    Years: 31.00    Pack years: 62.00    Types: Cigarettes    Quit date: 10/23/1982    Years since quitting: 37.7  . Smokeless tobacco: Never Used  Substance and Sexual Activity  . Alcohol use: No    Alcohol/week: 0.0 standard drinks  . Drug use: No  . Sexual activity: Not on file  Other Topics Concern  . Not on file  Social History Narrative    Lives with wife in a one story home.  Has a son and a daughter.     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     Education: 2 years of college.   Social Determinants of Health   Financial Resource Strain: Low Risk   . Difficulty of Paying Living Expenses: Not hard at all  Food Insecurity: No Food Insecurity  . Worried About Charity fundraiser in the Last Year: Never true  . Ran Out of Food in the Last Year: Never true  Transportation Needs: No Transportation Needs  . Lack of Transportation (Medical): No  . Lack of Transportation (Non-Medical): No  Physical Activity:   . Days of Exercise per Week: Not on file  . Minutes of Exercise per Session: Not on file  Stress:   . Feeling of Stress : Not on file  Social Connections:   . Frequency of Communication with Friends and Family: Not on file  . Frequency of Social Gatherings with Friends and Family: Not on file  . Attends Religious Services: Not on file  . Active Member of Clubs or Organizations: Not on file  . Attends Archivist Meetings: Not on file  . Marital Status: Not on file    Allergies: Allergies  Allergen Reactions  . Atorvastatin Other (See Comments)    unknown  . Niacin     REACTION: Severe heartburn  . Pioglitazone     REACTION: Edema    Outpatient Meds: Current Outpatient Medications  Medication Sig Dispense Refill  . Accu-Chek FastClix Lancets MISC USE TO CHECK BLOOD SUGAR UP TO 6 TIMES DAILY 612 each 3  . alfuzosin (UROXATRAL) 10 MG 24 hr tablet TAKE 1 TABLET BY MOUTH  DAILY WITH BREAKFAST 90 tablet 3  . allopurinol (ZYLOPRIM) 300 MG tablet TAKE 1 TABLET BY MOUTH  DAILY 90 tablet 3  . amitriptyline (ELAVIL) 25 MG tablet TAKE 1 TABLET(25 MG) BY MOUTH AT BEDTIME 30 tablet 1  . aspirin (BAYER LOW STRENGTH) 81 MG EC tablet Take 81 mg by mouth daily.      . Blood Glucose Monitoring Suppl (ACCU-CHEK AVIVA PLUS) w/Device KIT Use as directed 1 kit 0  . carvedilol (COREG) 3.125 MG tablet  TAKE 1/2 TABLET BY MOUTH 2  TIMES DAILY WITH MEALS 90 tablet 3  . Continuous Blood Gluc Sensor (DEXCOM G6 SENSOR) MISC Inject 1 Device into the skin as directed. Apply to skin SQ every 10 days 9 each 3  . cycloSPORINE (RESTASIS) 0.05 % ophthalmic emulsion Place 1 drop into both eyes 2 (two) times daily.      . diclofenac Sodium (VOLTAREN) 1 % GEL Apply 2 g topically 4 (four) times daily. 150 g 3  .  Dulaglutide (TRULICITY) 8.14 GY/1.8HU SOPN Inject 0.75 mg into the skin once a week. 6 mL 3  . DULoxetine (CYMBALTA) 60 MG capsule TAKE 1 CAPSULE BY MOUTH  DAILY 30 capsule 11  . fluticasone (FLONASE) 50 MCG/ACT nasal spray Place 1 spray into the nose daily as needed for allergies.     . folic acid (FOLVITE) 1 MG tablet Take 2 mg by mouth 2 (two) times daily. 4 tabs daily    . furosemide (LASIX) 20 MG tablet TAKE 1 TABLET BY MOUTH  DAILY 90 tablet 3  . glucose blood (ACCU-CHEK AVIVA PLUS) test strip USE TO TEST BLOOD SUGAR 6  TIMES PER DAY; E11.9 600 each 11  . Insulin Infusion Pump Supplies (AUTOSOFT XC INFUSION SET) MISC Inject 1 Act into the skin every other day. Use with 9m cannula and 23 inch tubing. *5 boxes (90 day supply) 5 each 3  . Insulin Infusion Pump Supplies (T:SLIM INSULIN CARTRIDGE 3ML) MISC Use with insulin pump, fill once every 2 days. 5 each 3  . insulin lispro (HUMALOG) 100 UNIT/ML injection For use in pump, total of 120 units per day 120 mL 3  . mometasone (ELOCON) 0.1 % lotion APPLY TOPICALLY DAILY 60 mL 11  . Multiple Vitamins-Minerals (CENTRUM SILVER PO) Take 1 tablet by mouth daily.      . naproxen (NAPROSYN) 500 MG tablet Take 1 tablet (500 mg total) by mouth 2 (two) times daily with a meal. 60 tablet 2  . Omega-3 Fatty Acids (FISH OIL) 1200 MG CAPS Take 1,200 mg by mouth daily.     .Marland Kitchenomeprazole (PRILOSEC) 20 MG capsule Take 1 capsule (20 mg total) by mouth daily as needed. 90 capsule 2  . rosuvastatin (CRESTOR) 40 MG tablet TAKE 1 TABLET BY MOUTH AT  BEDTIME 90 tablet 3  .  vitamin B-12 (CYANOCOBALAMIN) 1000 MCG tablet Take 1,000 mcg by mouth daily.    . Vitamin D, Ergocalciferol, (DRISDOL) 1.25 MG (50000 UT) CAPS capsule Take 1 capsule (50,000 Units total) by mouth every 7 (seven) days. 5 capsule 6   No current facility-administered medications for this visit.      ___________________________________________________________________ Objective   Exam:  BP 140/66   Pulse 85   Ht 6' (1.829 m)   Wt 285 lb (129.3 kg)   BMI 38.65 kg/m    General: Well-appearing, pleasant and conversational.  Gets on exam table without assistance  Eyes: sclera anicteric, no redness  ENT: oral mucosa moist without lesions, no cervical or supraclavicular lymphadenopathy  CV: RRR without murmur, S1/S2, no JVD, no peripheral edema  Resp: clear to auscultation bilaterally, normal RR and effort noted  GI: soft, obese, no tenderness, with active bowel sounds. No guarding or palpable organomegaly noted, though limited by body habitus.  Glucose monitor and insulin pump in upper midline and right abdominal wall, respectively  Skin; warm and dry, no rash or jaundice noted.  He has a healed abrasion on the right shin from a recent tangle with a ladder.  Healed site lateral aspect left ankle after removal of a skin cancer months ago.  Neuro: awake, alert and oriented x 3. Normal gross motor function and fluent speech  Labs:  CBC Latest Ref Rng & Units 07/12/2020 04/09/2020 01/08/2020  WBC 4.0 - 10.5 K/uL 5.3 4.5 4.4  Hemoglobin 13.0 - 17.0 g/dL 13.6 14.9 14.9  Hematocrit 39 - 52 % 40.8 44.0 44.6  Platelets 150 - 400 K/uL 130.0(L) 116.0(L) 123.0(L)   CMP Latest Ref Rng &  Units 07/12/2020 04/09/2020 01/08/2020  Glucose 70 - 99 mg/dL 135(H) 111(H) 125(H)  BUN 6 - 23 mg/dL '19 19 18  ' Creatinine 0.40 - 1.50 mg/dL 1.51(H) 1.49 1.38  Sodium 135 - 145 mEq/L 142 140 141  Potassium 3.5 - 5.1 mEq/L 3.8 3.7 4.3  Chloride 96 - 112 mEq/L 105 105 104  CO2 19 - 32 mEq/L 31 32 35(H)  Calcium  8.4 - 10.5 mg/dL 8.7 9.2 9.2  Total Protein 6.0 - 8.3 g/dL - - -  Total Bilirubin 0.2 - 1.2 mg/dL - - -  Alkaline Phos 39 - 117 U/L - - -  AST 0 - 37 U/L - - -  ALT 0 - 53 U/L - - -     Assessment: Encounter Diagnoses  Name Primary?  . Personal history of colonic polyps Yes  . Stage 3a chronic kidney disease ([place])   . Class 2 severe obesity with serious comorbidity and body mass index (BMI) of 38.0 to 38.9 in adult, unspecified obesity type ([place])   . Obstructive sleep apnea     Personal history of colon polyps and family history of colon cancer.  I recommended a surveillance colonoscopy, which I hope to be his last unless there are worrisome findings.  He was agreeable after discussion of procedure and risks.  The benefits and risks of the planned procedure were described in detail with the patient or (when appropriate) their health care proxy.  Risks were outlined as including, but not limited to, bleeding, infection, perforation, adverse medication reaction leading to cardiac or pulmonary decompensation, pancreatitis (if ERCP).  The limitation of incomplete mucosal visualization was also discussed.  No guarantees or warranties were given.  Patient at increased risk for cardiopulmonary complications of procedure due to medical comorbidities.   Thank you for the courtesy of this consult.  Please call me with any questions or concerns.  Nelida Meuse III  CC: Referring provider noted above

## 2020-07-23 NOTE — Telephone Encounter (Signed)
Dr Dois Davenport, MD has scheduled the above patient for a colonoscopy on 09-22-2020. Our records show that he is on insulin therapy via an insulin pump.  Our colonoscopy prep protocol requires that:  the patient must be on a clear liquid diet the entire day prior to the procedure date as well as the morning of the procedure  the patient must be NPO for 2 hours prior to the procedure   the patient must consume a PEG 3350 solution to prepare for the procedure.  Please advise [place] of any adjustments that need to be made to the patient's insulin pump therapy prior to the above procedure date.    Please route back to Magdalene River, CMA  If you have any question, please call me at [phone].  Thank you for your help with this matter.  Vivien Rota, CMA

## 2020-07-23 NOTE — Patient Instructions (Signed)
If you are age 79 or older, your body mass index should be between 23-30. Your Body mass index is 38.65 kg/m. If this is out of the aforementioned range listed, please consider follow up with your Primary Care Provider.  If you are age 19 or younger, your body mass index should be between 19-25. Your Body mass index is 38.65 kg/m. If this is out of the aformentioned range listed, please consider follow up with your Primary Care Provider.   You have been scheduled for a colonoscopy. Please follow written instructions given to you at your visit today.  Please pick up your prep supplies at the pharmacy within the next 1-3 days. If you use inhalers (even only as needed), please bring them with you on the day of your procedure.   It was a pleasure to see you today!  Dr. Loletha Carrow

## 2020-07-30 ENCOUNTER — Ambulatory Visit: Payer: Medicare Other | Admitting: Nurse Practitioner

## 2020-07-30 ENCOUNTER — Encounter: Payer: Self-pay | Admitting: Nurse Practitioner

## 2020-07-30 ENCOUNTER — Other Ambulatory Visit: Payer: Self-pay

## 2020-07-30 VITALS — BP 120/60 | HR 76 | Temp 96.9°F | Ht 72.0 in | Wt 280.2 lb

## 2020-07-30 DIAGNOSIS — H6993 Unspecified Eustachian tube disorder, bilateral: Secondary | ICD-10-CM

## 2020-07-30 MED ORDER — CETIRIZINE HCL 5 MG PO TABS: 5.0000 mg | ORAL_TABLET | Freq: Every day | ORAL | Status: DC

## 2020-07-30 NOTE — Patient Instructions (Signed)
Start flonase 2sprays in each nostril daily and zyrtec 5mg  daily x 2weeks.  Eustachian Tube Dysfunction  Eustachian tube dysfunction refers to a condition in which a blockage develops in the narrow passage that connects the middle ear to the back of the nose (eustachian tube). The eustachian tube regulates air pressure in the middle ear by letting air move between the ear and nose. It also helps to drain fluid from the middle ear space. Eustachian tube dysfunction can affect one or both ears. When the eustachian tube does not function properly, air pressure, fluid, or both can build up in the middle ear. What are the causes? This condition occurs when the eustachian tube becomes blocked or cannot open normally. Common causes of this condition include:  Ear infections.  Colds and other infections that affect the nose, mouth, and throat (upper respiratory tract).  Allergies.  Irritation from cigarette smoke.  Irritation from stomach acid coming up into the esophagus (gastroesophageal reflux). The esophagus is the tube that carries food from the mouth to the stomach.  Sudden changes in air pressure, such as from descending in an airplane or scuba diving.  Abnormal growths in the nose or throat, such as: ? Growths that line the nose (nasal polyps). ? Abnormal growth of cells (tumors). ? Enlarged tissue at the back of the throat (adenoids). What increases the risk? You are more likely to develop this condition if:  You smoke.  You are overweight.  You are a child who has: ? Certain birth defects of the mouth, such as cleft palate. ? Large tonsils or adenoids. What are the signs or symptoms? Common symptoms of this condition include:  A feeling of fullness in the ear.  Ear pain.  Clicking or popping noises in the ear.  Ringing in the ear.  Hearing loss.  Loss of balance.  Dizziness. Symptoms may get worse when the air pressure around you changes, such as when you travel  to an area of high elevation, fly on an airplane, or go scuba diving. How is this diagnosed? This condition may be diagnosed based on:  Your symptoms.  A physical exam of your ears, nose, and throat.  Tests, such as those that measure: ? The movement of your eardrum (tympanogram). ? Your hearing (audiometry). How is this treated? Treatment depends on the cause and severity of your condition.  In mild cases, you may relieve your symptoms by moving air into your ears. This is called "popping the ears."  In more severe cases, or if you have symptoms of fluid in your ears, treatment may include: ? Medicines to relieve congestion (decongestants). ? Medicines that treat allergies (antihistamines). ? Nasal sprays or ear drops that contain medicines that reduce swelling (steroids). ? A procedure to drain the fluid in your eardrum (myringotomy). In this procedure, a small tube is placed in the eardrum to:  Drain the fluid.  Restore the air in the middle ear space. ? A procedure to insert a balloon device through the nose to inflate the opening of the eustachian tube (balloon dilation). Follow these instructions at home: Lifestyle  Do not do any of the following until your health care provider approves: ? Travel to high altitudes. ? Fly in airplanes. ? Work in a Pension scheme manager or room. ? Scuba dive.  Do not use any products that contain nicotine or tobacco, such as cigarettes and e-cigarettes. If you need help quitting, ask your health care provider.  Keep your ears dry. Wear fitted earplugs  during showering and bathing. Dry your ears completely after. General instructions  Take over-the-counter and prescription medicines only as told by your health care provider.  Use techniques to help pop your ears as recommended by your health care provider. These may include: ? Chewing gum. ? Yawning. ? Frequent, forceful swallowing. ? Closing your mouth, holding your nose closed, and  gently blowing as if you are trying to blow air out of your nose.  Keep all follow-up visits as told by your health care provider. This is important. Contact a health care provider if:  Your symptoms do not go away after treatment.  Your symptoms come back after treatment.  You are unable to pop your ears.  You have: ? A fever. ? Pain in your ear. ? Pain in your head or neck. ? Fluid draining from your ear.  Your hearing suddenly changes.  You become very dizzy.  You lose your balance. Summary  Eustachian tube dysfunction refers to a condition in which a blockage develops in the eustachian tube.  It can be caused by ear infections, allergies, inhaled irritants, or abnormal growths in the nose or throat.  Symptoms include ear pain, hearing loss, or ringing in the ears.  Mild cases are treated with maneuvers to unblock the ears, such as yawning or ear popping.  Severe cases are treated with medicines. Surgery may also be done (rare). This information is not intended to replace advice given to you by your health care provider. Make sure you discuss any questions you have with your health care provider. Document Revised: 01/29/2018 Document Reviewed: 01/29/2018 Elsevier Patient Education  [place].

## 2020-07-30 NOTE — Progress Notes (Signed)
Subjective:  Patient ID: [name], male    DOB: [birth year]  Age: 79 y.o. MRN: [id number]  CC: Acute Visit (Pt c/o popping sound in his left ear x3 days. Pt states he has tried cleaning his ear by irrigating it but it did not help. )  Otalgia  There is pain in both ears. This is a new problem. The current episode started in the past 7 days. The problem occurs constantly. The problem has been unchanged. There has been no fever. Pertinent negatives include no coughing, ear discharge, headaches, hearing loss, neck pain, rhinorrhea or sore throat. He has tried ear drops for the symptoms. The treatment provided no relief. There is no history of a chronic ear infection, hearing loss or a tympanostomy tube.   Reviewed past Medical, Social and Family history today.  Outpatient Medications Prior to Visit  Medication Sig Dispense Refill  . Accu-Chek FastClix Lancets MISC USE TO CHECK BLOOD SUGAR UP TO 6 TIMES DAILY 612 each 3  . alfuzosin (UROXATRAL) 10 MG 24 hr tablet TAKE 1 TABLET BY MOUTH  DAILY WITH BREAKFAST 90 tablet 3  . allopurinol (ZYLOPRIM) 300 MG tablet TAKE 1 TABLET BY MOUTH  DAILY 90 tablet 3  . amitriptyline (ELAVIL) 25 MG tablet TAKE 1 TABLET(25 MG) BY MOUTH AT BEDTIME 30 tablet 1  . aspirin (BAYER LOW STRENGTH) 81 MG EC tablet Take 81 mg by mouth daily.      . Blood Glucose Monitoring Suppl (ACCU-CHEK AVIVA PLUS) w/Device KIT Use as directed 1 kit 0  . carvedilol (COREG) 3.125 MG tablet TAKE 1/2 TABLET BY MOUTH 2  TIMES DAILY WITH MEALS 90 tablet 3  . Continuous Blood Gluc Sensor (DEXCOM G6 SENSOR) MISC Inject 1 Device into the skin as directed. Apply to skin SQ every 10 days 9 each 3  . cycloSPORINE (RESTASIS) 0.05 % ophthalmic emulsion Place 1 drop into both eyes 2 (two) times daily.      . diclofenac Sodium (VOLTAREN) 1 % GEL Apply 2 g topically 4 (four) times daily. 150 g 3  . Dulaglutide (TRULICITY) 4.09 BD/5.3GD SOPN Inject 0.75 mg into the skin once a week. 6 mL 3  . DULoxetine  (CYMBALTA) 60 MG capsule TAKE 1 CAPSULE BY MOUTH  DAILY 30 capsule 11  . fluticasone (FLONASE) 50 MCG/ACT nasal spray Place 1 spray into the nose daily as needed for allergies.     . folic acid (FOLVITE) 1 MG tablet Take 2 mg by mouth 2 (two) times daily. 4 tabs daily    . furosemide (LASIX) 20 MG tablet TAKE 1 TABLET BY MOUTH  DAILY 90 tablet 3  . glucose blood (ACCU-CHEK AVIVA PLUS) test strip USE TO TEST BLOOD SUGAR 6  TIMES PER DAY; E11.9 600 each 11  . Insulin Infusion Pump Supplies (AUTOSOFT XC INFUSION SET) MISC Inject 1 Act into the skin every other day. Use with 35m cannula and 23 inch tubing. *5 boxes (90 day supply) 5 each 3  . Insulin Infusion Pump Supplies (T:SLIM INSULIN CARTRIDGE 3ML) MISC Use with insulin pump, fill once every 2 days. 5 each 3  . insulin lispro (HUMALOG) 100 UNIT/ML injection For use in pump, total of 120 units per day 120 mL 3  . mometasone (ELOCON) 0.1 % lotion APPLY TOPICALLY DAILY 60 mL 11  . Multiple Vitamins-Minerals (CENTRUM SILVER PO) Take 1 tablet by mouth daily.      . naproxen (NAPROSYN) 500 MG tablet Take 1 tablet (500 mg total) by mouth  2 (two) times daily with a meal. 60 tablet 2  . Omega-3 Fatty Acids (FISH OIL) 1200 MG CAPS Take 1,200 mg by mouth daily.     Marland Kitchen omeprazole (PRILOSEC) 20 MG capsule Take 1 capsule (20 mg total) by mouth daily as needed. 90 capsule 2  . PEG-KCl-NaCl-NaSulf-Na Asc-C (PLENVU) 140 g SOLR Take 140 g by mouth as directed. Manufacturer's coupon Universal coupon code:BIN: P2366821; GROUP: [id number]; PCN: [id number]; ID: [id number]; PAY NO MORE $50 1 each 0  . rosuvastatin (CRESTOR) 40 MG tablet TAKE 1 TABLET BY MOUTH AT  BEDTIME 90 tablet 3  . vitamin B-12 (CYANOCOBALAMIN) 1000 MCG tablet Take 1,000 mcg by mouth daily.    . Vitamin D, Ergocalciferol, (DRISDOL) 1.25 MG (50000 UT) CAPS capsule Take 1 capsule (50,000 Units total) by mouth every 7 (seven) days. 5 capsule 6   No facility-administered medications prior to visit.     ROS See HPI  Objective:  BP 120/60 (BP Location: Left Arm, Patient Position: Sitting, Cuff Size: Normal)   Pulse 76   Temp (!) 96.9 F (36.1 C) (Temporal)   Ht 6' (1.829 m)   Wt 280 lb 3.2 oz (127.1 kg)   SpO2 97%   BMI 38.00 kg/m   Physical Exam Vitals reviewed.  Constitutional:      General: He is not in acute distress. HENT:     Right Ear: Ear canal and external ear normal. No tenderness. A middle ear effusion is present. There is no impacted cerumen. No mastoid tenderness. Tympanic membrane is not erythematous.     Left Ear: Ear canal and external ear normal. No tenderness. A middle ear effusion is present. There is no impacted cerumen. No mastoid tenderness. Tympanic membrane is not erythematous.  Neurological:     Mental Status: He is alert and oriented to person, place, and time.    Assessment & Plan:  This visit occurred during the SARS-CoV-2 public health emergency.  Safety protocols were in place, including screening questions prior to the visit, additional usage of staff PPE, and extensive cleaning of exam room while observing appropriate contact time as indicated for disinfecting solutions.   [name] was seen today for acute visit.  Diagnoses and all orders for this visit:  Eustachian tube disorder, bilateral -     cetirizine (ZYRTEC) 5 MG tablet; Take 1 tablet (5 mg total) by mouth daily.   Problem List Items Addressed This Visit      Nervous and Auditory   ETD (Eustachian tube dysfunction), left - Primary   Relevant Medications   cetirizine (ZYRTEC) 5 MG tablet       Follow-up: No follow-ups on file.  Wilfred Lacy, NP

## 2020-08-02 ENCOUNTER — Encounter: Payer: Self-pay | Admitting: Nurse Practitioner

## 2020-08-12 ENCOUNTER — Encounter: Payer: Self-pay | Admitting: Endocrinology

## 2020-08-12 ENCOUNTER — Other Ambulatory Visit: Payer: Self-pay

## 2020-08-12 ENCOUNTER — Ambulatory Visit: Payer: Medicare Other | Admitting: Endocrinology

## 2020-08-12 VITALS — BP 128/68 | HR 88 | Ht 72.0 in | Wt 280.0 lb

## 2020-08-12 DIAGNOSIS — N189 Chronic kidney disease, unspecified: Secondary | ICD-10-CM

## 2020-08-12 DIAGNOSIS — E0800 Diabetes mellitus due to underlying condition with hyperosmolarity without nonketotic hyperglycemic-hyperosmolar coma (NKHHC): Secondary | ICD-10-CM

## 2020-08-12 DIAGNOSIS — E1122 Type 2 diabetes mellitus with diabetic chronic kidney disease: Secondary | ICD-10-CM

## 2020-08-12 LAB — POCT GLYCOSYLATED HEMOGLOBIN (HGB A1C): Hemoglobin A1C: 6.7 % — AB (ref 4.0–5.6)

## 2020-08-12 MED ORDER — TRULICITY 1.5 MG/0.5ML ~~LOC~~ SOAJ: 1.5000 mg | SUBCUTANEOUS | 3 refills | Status: DC

## 2020-08-12 NOTE — Patient Instructions (Addendum)
Please take these pump settings:  basal rate of 1.7 units/hr, 24 hrs per day.   mealtime bolus of 1 unit/4 grams carbohydrate.  continue correction bolus (which some people call "sensitivity," or "insulin sensitivity ratio," or just "isr") of 1 unit for each 40 by which your glucose exceeds 100.   I have sent a prescription to your pharmacy, to double the Trulicity.  Please come back for a follow-up appointment in 3 months.

## 2020-08-12 NOTE — Telephone Encounter (Signed)
Left message requesting patient call back to discuss medication adjustments for colonscopy.

## 2020-08-12 NOTE — Telephone Encounter (Signed)
Patient aware of medication adjustments for colonoscopy.

## 2020-08-12 NOTE — Progress Notes (Signed)
Subjective:    Patient ID: [name], male    DOB: Mar 12, 1941, 79 y.o.   MRN: [id number]  HPI Pt returns for f/u of diabetes mellitus: DM type: Insulin-requiring type 2 Dx'ed: 4270 Complications: PN, CAD, and CRI.   Therapy: insulin since 1995.  DKA: never.  Severe hypoglycemia: never.  Pancreatitis: never.  Other: he started pump therapy (now T-slim, since mid-2015), and dexcom G-6 continuous glucose monitor.   Interval history: he takes these pump settings:   basal rate of 1.9 units/hr, 24 hrs per day.   mealtime bolus of 1 unit/3 grams carbohydrate. continue correction bolus (which some people call "sensitivity," or "insulin sensitivity ratio," or just "isr") of 1 unit for each 40 by which your glucose exceeds 100.  "Control-IQ" is on 99% of the time TDD is approx 106 units (53% basal, 44% meal bolus, 1% correction bolus).   I reviewed continuous glucose monitor data.  Glucose varies from 80-370.  It is in general higher PC than AC (mostly lunch), but there is not much trend throughout the day.  Glucose falls slightly throughout the night.  pt states he feels well in general.   Past Medical History:  Diagnosis Date  . Allergy   . Anal fissure   . Anemia, unspecified   . Arthritis    Spinal OA  . BACK PAIN, LUMBAR 10/23/2007  . CARDIAC MURMUR, SYSTOLIC 03/24/3761  . DIABETES MELLITUS, TYPE I 05/20/2007  . DIASTOLIC DYSFUNCTION 06/23/5175  . 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[place])   . Duodenitis   . Edema 05/12/2008  . Esophageal stricture   . GERD (gastroesophageal reflux disease)   . 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 Gynecomastia   . Hepatic steatosis   . HYPERLIPIDEMIA 05/20/2007  . Hypertension   . Hypogonadism male   . Morbid obesity ([place]) 09/10/2009  . OBSTRUCTIVE SLEEP APNEA 11/04/2007  . Personal history of colonic polyps   . RENAL INSUFFICIENCY 05/12/2008  . Sleep apnea    uses cpap  . Squamous cell carcinoma of skin 02/16/2020   left lower leg, anterior-cx3,39f  .  Urolithiasis     Past Surgical History:  Procedure Laterality Date  . Abdominal [place] 09/1997  . arm fracture Left 1958   with hardware  . Colon cancer screening    . COLONOSCOPY  08/18/2004   diverticulitis  . COLONOSCOPY  09/24/2009  . ELECTROCARDIOGRAM  02/2006  . FLEXIBLE SIGMOIDOSCOPY  03/04/2001   polyps, anal fissure  . GANGLION CYST EXCISION Left 1980  . Lower Arterial  04/13/2004  . POLYPECTOMY    . Rest Cardiolite  03/19/2003    Social History   Socioeconomic History  . Marital status: Married    Spouse name: Not on file  . Number of children: Not on file  . Years of education: Not on file  . Highest education level: Not on file  Occupational History    Employer: [employer]  Tobacco Use  . Smoking status: Former Smoker    Packs/day: 2.00    Years: 31.00    Pack years: 62.00    Types: Cigarettes    Quit date: 10/23/1982    Years since quitting: 37.8  . Smokeless tobacco: Never Used  Substance and Sexual Activity  . Alcohol use: No    Alcohol/week: 0.0 standard drinks  . Drug use: No  . Sexual activity: Not on file  Other Topics Concern  . Not on file  Social History Narrative   Lives with wife  in a one story home.  Has a son and a daughter.     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     Education: 2 years of college.   Social Determinants of Health   Financial Resource Strain: Low Risk   . Difficulty of Paying Living Expenses: Not hard at all  Food Insecurity: No Food Insecurity  . Worried About Charity fundraiser in the Last Year: Never true  . Ran Out of Food in the Last Year: Never true  Transportation Needs: No Transportation Needs  . Lack of Transportation (Medical): No  . Lack of Transportation (Non-Medical): No  Physical Activity:   . Days of Exercise per Week: Not on file  . Minutes of Exercise per Session: Not on file  Stress:   . Feeling of Stress : Not on file  Social Connections:   . Frequency of Communication  with Friends and Family: Not on file  . Frequency of Social Gatherings with Friends and Family: Not on file  . Attends Religious Services: Not on file  . Active Member of Clubs or Organizations: Not on file  . Attends Archivist Meetings: Not on file  . Marital Status: Not on file  Intimate Partner Violence:   . Fear of Current or Ex-Partner: Not on file  . Emotionally Abused: Not on file  . Physically Abused: Not on file  . Sexually Abused: Not on file    Current Outpatient Medications on File Prior to Visit  Medication Sig Dispense Refill  . Accu-Chek FastClix Lancets MISC USE TO CHECK BLOOD SUGAR UP TO 6 TIMES DAILY 612 each 3  . alfuzosin (UROXATRAL) 10 MG 24 hr tablet TAKE 1 TABLET BY MOUTH  DAILY WITH BREAKFAST 90 tablet 3  . allopurinol (ZYLOPRIM) 300 MG tablet TAKE 1 TABLET BY MOUTH  DAILY 90 tablet 3  . amitriptyline (ELAVIL) 25 MG tablet TAKE 1 TABLET(25 MG) BY MOUTH AT BEDTIME 30 tablet 1  . aspirin (BAYER LOW STRENGTH) 81 MG EC tablet Take 81 mg by mouth daily.      . Blood Glucose Monitoring Suppl (ACCU-CHEK AVIVA PLUS) w/Device KIT Use as directed 1 kit 0  . carvedilol (COREG) 3.125 MG tablet TAKE 1/2 TABLET BY MOUTH 2  TIMES DAILY WITH MEALS 90 tablet 3  . cetirizine (ZYRTEC) 5 MG tablet Take 1 tablet (5 mg total) by mouth daily.    . Continuous Blood Gluc Sensor (DEXCOM G6 SENSOR) MISC Inject 1 Device into the skin as directed. Apply to skin SQ every 10 days 9 each 3  . cycloSPORINE (RESTASIS) 0.05 % ophthalmic emulsion Place 1 drop into both eyes 2 (two) times daily.      . diclofenac Sodium (VOLTAREN) 1 % GEL Apply 2 g topically 4 (four) times daily. 150 g 3  . DULoxetine (CYMBALTA) 60 MG capsule TAKE 1 CAPSULE BY MOUTH  DAILY 30 capsule 11  . fluticasone (FLONASE) 50 MCG/ACT nasal spray Place 1 spray into the nose daily as needed for allergies.     . folic acid (FOLVITE) 1 MG tablet Take 2 mg by mouth 2 (two) times daily. 4 tabs daily    . furosemide (LASIX)  20 MG tablet TAKE 1 TABLET BY MOUTH  DAILY 90 tablet 3  . glucose blood (ACCU-CHEK AVIVA PLUS) test strip USE TO TEST BLOOD SUGAR 6  TIMES PER DAY; E11.9 600 each 11  . Insulin Infusion Pump (T: SLIM X2 INS PMP/CONTROL 7.4) DEVI by  Does not apply route.    . Insulin Infusion Pump Supplies (AUTOSOFT XC INFUSION SET) MISC Inject 1 Act into the skin every other day. Use with 46m cannula and 23 inch tubing. *5 boxes (90 day supply) 5 each 3  . Insulin Infusion Pump Supplies (T:SLIM INSULIN CARTRIDGE 3ML) MISC Use with insulin pump, fill once every 2 days. 5 each 3  . insulin lispro (HUMALOG) 100 UNIT/ML injection For use in pump, total of 120 units per day 120 mL 3  . mometasone (ELOCON) 0.1 % lotion APPLY TOPICALLY DAILY 60 mL 11  . Multiple Vitamins-Minerals (CENTRUM SILVER PO) Take 1 tablet by mouth daily.      . naproxen (NAPROSYN) 500 MG tablet Take 1 tablet (500 mg total) by mouth 2 (two) times daily with a meal. 60 tablet 2  . Omega-3 Fatty Acids (FISH OIL) 1200 MG CAPS Take 1,200 mg by mouth daily.     .Marland Kitchenomeprazole (PRILOSEC) 20 MG capsule Take 1 capsule (20 mg total) by mouth daily as needed. 90 capsule 2  . PEG-KCl-NaCl-NaSulf-Na Asc-C (PLENVU) 140 g SOLR Take 140 g by mouth as directed. Manufacturer's coupon Universal coupon code:BIN: 0P2366821 GROUP: [id number] PCN: [id number]; ID:: [id number] PAY NO MORE $50 1 each 0  . rosuvastatin (CRESTOR) 40 MG tablet TAKE 1 TABLET BY MOUTH AT  BEDTIME 90 tablet 3  . vitamin B-12 (CYANOCOBALAMIN) 1000 MCG tablet Take 1,000 mcg by mouth daily.    . Vitamin D, Ergocalciferol, (DRISDOL) 1.25 MG (50000 UT) CAPS capsule Take 1 capsule (50,000 Units total) by mouth every 7 (seven) days. 5 capsule 6   No current facility-administered medications on file prior to visit.    Allergies  Allergen Reactions  . Atorvastatin Other (See Comments)    unknown  . Niacin     REACTION: Severe heartburn  . Pioglitazone     REACTION: Edema    Family History  Problem  Relation Age of Onset  . Colon cancer Brother 521 . Colon polyps Brother   . Lung cancer Brother   . Other Mother        MVA, deceased 5109s . Healthy Daughter   . Healthy Son   . Rectal cancer Neg Hx   . Stomach cancer Neg Hx     BP 128/68   Pulse 88   Ht 6' (1.829 m)   Wt 280 lb (127 kg)   SpO2 93%   BMI 37.97 kg/m    Review of Systems Denies n/v/d    Objective:   Physical Exam VITAL SIGNS:  See vs page GENERAL: no distress Pulses: dorsalis pedis intact bilat.   MSK: no deformity of the feet CV: 1+ bilat leg edema Skin:  no ulcer on the feet.  normal color and temp on the feet. Neuro: sensation is intact to touch on the feet, but decreased from normal.    Lab Results  Component Value Date   HGBA1C 6.7 (A) 08/12/2020       Assessment & Plan:  Insulin-requiring type 2 DM, with CRI. The pattern of his cbg's indicates he needs some adjustment in his therapy   Patient Instructions  Please take these pump settings:  basal rate of 1.7 units/hr, 24 hrs per day.   mealtime bolus of 1 unit/4 grams carbohydrate.  continue correction bolus (which some people call "sensitivity," or "insulin sensitivity ratio," or just "isr") of 1 unit for each 40 by which your glucose exceeds 100.   I have sent a  prescription to your pharmacy, to double the Trulicity.  Please come back for a follow-up appointment in 3 months.

## 2020-08-12 NOTE — Telephone Encounter (Signed)
please contact patient: I forgot to mention: For the colonoscopy, reduce the insulins settings to half the usual amount when you go on the liquid diet.  When you start eating again, please resume usual settings.

## 2020-08-18 LAB — HM DIABETES EYE EXAM

## 2020-08-20 LAB — HM DIABETES EYE EXAM

## 2020-09-08 DIAGNOSIS — H6993 Unspecified Eustachian tube disorder, bilateral: Secondary | ICD-10-CM | POA: Insufficient documentation

## 2020-09-13 ENCOUNTER — Ambulatory Visit: Payer: Medicare Other | Admitting: Physical Medicine & Rehabilitation

## 2020-09-14 ENCOUNTER — Other Ambulatory Visit: Payer: Self-pay

## 2020-09-14 ENCOUNTER — Encounter: Payer: Medicare Other | Attending: Physical Medicine & Rehabilitation | Admitting: Physical Medicine & Rehabilitation

## 2020-09-14 ENCOUNTER — Encounter: Payer: Self-pay | Admitting: Physical Medicine & Rehabilitation

## 2020-09-14 VITALS — BP 139/76 | HR 97 | Temp 98.0°F | Ht 72.0 in | Wt 281.6 lb

## 2020-09-14 DIAGNOSIS — G8929 Other chronic pain: Secondary | ICD-10-CM | POA: Insufficient documentation

## 2020-09-14 DIAGNOSIS — E1142 Type 2 diabetes mellitus with diabetic polyneuropathy: Secondary | ICD-10-CM

## 2020-09-14 DIAGNOSIS — M545 Low back pain, unspecified: Secondary | ICD-10-CM | POA: Diagnosis not present

## 2020-09-14 DIAGNOSIS — M791 Myalgia, unspecified site: Secondary | ICD-10-CM | POA: Diagnosis present

## 2020-09-14 DIAGNOSIS — R269 Unspecified abnormalities of gait and mobility: Secondary | ICD-10-CM | POA: Diagnosis present

## 2020-09-14 DIAGNOSIS — M792 Neuralgia and neuritis, unspecified: Secondary | ICD-10-CM | POA: Diagnosis not present

## 2020-09-14 NOTE — Progress Notes (Signed)
Subjective:    Patient ID: [name], male    DOB: Jul 12, 1941, 79 y.o.   MRN: [id number]   HPI  Male with pmh of OSA, CKD, HTN, DM type 1 with peripheral neuropathy presents for follow up of low back pain > neck pain.   On first visit, pt complained of b/l L>R back pain.  Started ~>20 years ago.  No inciting event.  Sitting/laying improve the pain.  Standing exacerbates the pain.  Sharp, burning pain.  Radiates to left posterior thigh.  Intermittent.  He has associated numbness/tingling.  He only tried ASA, which helps some.  Pain limits from doing ADLs and fishing.  He denies falls.  Last clinic visit on 03/15/20.  Since that time, pt states he back pain is getting worse. He has neck pain at night.  He states he is not interested in aquatic therapy. He is using Baclofen 1-2/week. Using ointment, does not recall, what it is. No improvement in weight loss. Denies falls. He completed therapies. He forgot his truck.   Pain Inventory Average Pain 6 Pain Right Now 4 My pain is intermittent, sharp, burning, stabbing and aching  In the last 24 hours, has pain interfered with the following? General activity 5 Relation with others 5 Enjoyment of life 5 What TIME of day is your pain at its worst? daytime Sleep (in general) Good  Pain is worse with: walking, standing, unsure and some activites Pain improves with: rest, medication and TENS Relief from Meds: 6     Family History  Problem Relation Age of Onset  . Colon cancer Brother 86  . Colon polyps Brother   . Lung cancer Brother   . Other Mother        MVA, deceased 24s  . Healthy Daughter   . Healthy Son   . Rectal cancer Neg Hx   . Stomach cancer Neg Hx    Social History   Socioeconomic History  . Marital status: Married    Spouse name: Not on file  . Number of children: Not on file  . Years of education: Not on file  . Highest education level: Not on file  Occupational History    Employer: [employer]  Tobacco Use   . Smoking status: Former Smoker    Packs/day: 2.00    Years: 31.00    Pack years: 62.00    Types: Cigarettes    Quit date: 10/23/1982    Years since quitting: 37.9  . Smokeless tobacco: Never Used  Vaping Use  . Vaping Use: Never used  Substance and Sexual Activity  . Alcohol use: No    Alcohol/week: 0.0 standard drinks  . Drug use: No  . Sexual activity: Not on file  Other Topics Concern  . Not on file  Social History Narrative   Lives with wife in a one story home.  Has a son and a daughter.     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     Education: 2 years of college.   Social Determinants of Health   Financial Resource Strain: Low Risk   . Difficulty of Paying Living Expenses: Not hard at all  Food Insecurity: No Food Insecurity  . Worried About Charity fundraiser in the Last Year: Never true  . Ran Out of Food in the Last Year: Never true  Transportation Needs: No Transportation Needs  . Lack of Transportation (Medical): No  . Lack of Transportation (Non-Medical): No  Physical Activity:   .  Days of Exercise per Week: Not on file  . Minutes of Exercise per Session: Not on file  Stress:   . Feeling of Stress : Not on file  Social Connections:   . Frequency of Communication with Friends and Family: Not on file  . Frequency of Social Gatherings with Friends and Family: Not on file  . Attends Religious Services: Not on file  . Active Member of Clubs or Organizations: Not on file  . Attends Archivist Meetings: Not on file  . Marital Status: Not on file   Past Surgical History:  Procedure Laterality Date  . Abdominal US  09/1997  . arm fracture Left 1958   with hardware  . Colon cancer screening    . COLONOSCOPY  08/18/2004   diverticulitis  . COLONOSCOPY  09/24/2009  . ELECTROCARDIOGRAM  02/2006  . FLEXIBLE SIGMOIDOSCOPY  03/04/2001   polyps, anal fissure  . GANGLION CYST EXCISION Left 1980  . Lower Arterial  04/13/2004  . POLYPECTOMY     . Rest Cardiolite  03/19/2003   Past Medical History:  Diagnosis Date  . Allergy   . Anal fissure   . Anemia, unspecified   . Arthritis    Spinal OA  . BACK PAIN, LUMBAR 10/23/2007  . CARDIAC MURMUR, SYSTOLIC 0/06/3266  . DIABETES MELLITUS, TYPE I 05/20/2007  . DIASTOLIC DYSFUNCTION 11/16/5807  . 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[place])   . Duodenitis   . Edema 05/12/2008  . Esophageal stricture   . GERD (gastroesophageal reflux disease)   . 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 Gynecomastia   . Hepatic steatosis   . HYPERLIPIDEMIA 05/20/2007  . Hypertension   . Hypogonadism male   . Morbid obesity ([place]) 09/10/2009  . OBSTRUCTIVE SLEEP APNEA 11/04/2007  . Personal history of colonic polyps   . RENAL INSUFFICIENCY 05/12/2008  . Sleep apnea    uses cpap  . Squamous cell carcinoma of skin 02/16/2020   left lower leg, anterior-cx3,38fu  . Urolithiasis    BP 139/76   Pulse 97   Temp 98 F (36.7 C)   Ht 6' (1.829 m)   Wt 281 lb 9.6 oz (127.7 kg)   SpO2 97%   BMI 38.19 kg/m   Opioid Risk Score:   Fall Risk Score:  `1  Depression screen PHQ 2/9  Depression screen [facility] 2/9 09/14/2020 12/31/2019 09/18/2018 09/20/2017 08/31/2016  Decreased Interest 0 0 0 0 0  Down, Depressed, Hopeless 0 0 0 0 0  PHQ - 2 Score 0 0 0 0 0  Altered sleeping - - - - 0  Tired, decreased energy - - - - 1  Change in appetite - - - - 0  Feeling bad or failure about yourself  - - - - 0  Trouble concentrating - - - - 0  Moving slowly or fidgety/restless - - - - 0  Suicidal thoughts - - - - 0  PHQ-9 Score - - - - 1  Difficult doing work/chores - - - - Somewhat difficult  Some recent data might be hidden    Review of Systems  HENT: Negative.   Eyes: Negative.   Respiratory: Negative for apnea and shortness of breath.   Cardiovascular: Negative.   Gastrointestinal: Negative.   Endocrine:       Diabetic High blood sugar  Genitourinary: Negative.           Musculoskeletal: Positive for arthralgias,  back pain, gait problem, myalgias and neck pain.  Skin: Negative.  Allergic/Immunologic: Negative.   Neurological: Positive for weakness and numbness.       Tingling  Hematological: Negative.   All other systems reviewed and are negative.     Objective:   Physical Exam  Constitutional: No distress . Vital signs reviewed. HENT: Normocephalic.  Atraumatic. Eyes: EOMI. No discharge. Cardiovascular: No JVD.   Respiratory: Normal effort.  No stridor.   GI: Non-distended.   Skin: Warm and dry.  Intact. Psych: Normal mood.  Normal behavior. Musc: TTP b/l lumbosacral PSPs Musc: Gait: Mildly antalgic Neuro: Alert             Strength          5/5 in all LE myotomes    Assessment & Plan:  Male with pmh of OSA, CKD, HTN, DM type 1 with peripheral neuropathy presents for follow up of low back pain >neck pain.   1. Chronic mechanical low back pain MRIs C,L-spine early 2016 suggesting multilevel spondylosis with mild b/l multilevel foraminal narrowing C4-C7 and shallow disc bulge at L5-S1 without central canal or foraminal stenosis. Avoid NSAIDs due to CKD Unable to tolerate Gabapentin, Lyrica Cont HEP  Continue  TENs Aquatic therapy, not going anymore, states he will try if injection is unsuccessful Cont tylenol Cont Robaxin 500 TID PRN, changed to Baclofen 10TID PRN due to change insurance, continue   Continue Cymbalta 60mg  Contback brace during periods of excessive activity, reminded to avoid prolonged use  Continue Lidoderm OTC Encouraged rest breaks             Acknowledges he can do more if he make a conscious effort PT completed             Continue Voltaren gel   2. Sacroiliitis See #1 Cont brace Not main pain generator at present, does not wish to proceed with injection at this time, particularly due to DM  3.  Sleep disturbance             Recently new mattress             Cont CPAP             Improved  4. Myalgia  Will schedule for trigger point injections  5. Diabetic neuropathy See #1, #3             Side effects with elavil to 50mg  qhs, continue 25mg  educated on signs/symptoms of seratonin syndrome previously  6. Morbid obesity Not interested in seeing dietitian at this time  No weight loss  7. Gait abnormality Completed therapies, cont HEP  Continue cane for safety  8. Nonhealing ulcer             Left lateral foot, improving  9. Lightheadedness  Encouraged BP monitoring at home

## 2020-09-20 ENCOUNTER — Telehealth: Payer: Self-pay | Admitting: Gastroenterology

## 2020-09-20 ENCOUNTER — Encounter: Payer: Self-pay | Admitting: Family Medicine

## 2020-09-20 ENCOUNTER — Other Ambulatory Visit: Payer: Self-pay

## 2020-09-20 NOTE — Telephone Encounter (Signed)
Please see Mychart message from patient with two episodes of syncope or near-syncope within the last week.  His routine colonoscopy scheduled for this week must be canceled because [name] cannot go under sedation until the issue is addressed.  Please call with that information and advice to call PCP today. I copied this to PCP Ethelene Hal) as well.  - H. Danis

## 2020-09-20 NOTE — Telephone Encounter (Signed)
Spoke with patient, advised that colonoscopy will be cancelled until this issue of syncope is addressed. Advised patient to contact Dr. Ethelene Hal to be evaluated, pt states that he will call back to reschedule procedure once he has been evaluated. Patient verbalized understanding and had no other concerns at the end of the call.

## 2020-09-20 NOTE — Telephone Encounter (Signed)
Pt is requesting a call back from a nurse, pt states he has passed out October 05, 2023 and again this morning for a few seconds, pt has a colonoscopy scheduled for 12/1 but pt would like to know if he needs to cancel this appt.

## 2020-09-21 ENCOUNTER — Ambulatory Visit: Payer: Medicare Other | Admitting: Family

## 2020-09-21 ENCOUNTER — Other Ambulatory Visit: Payer: Self-pay | Admitting: Family

## 2020-09-21 ENCOUNTER — Encounter: Payer: Self-pay | Admitting: Family

## 2020-09-21 VITALS — BP 110/58 | HR 92 | Temp 97.8°F | Ht 72.0 in | Wt 280.6 lb

## 2020-09-21 DIAGNOSIS — N289 Disorder of kidney and ureter, unspecified: Secondary | ICD-10-CM

## 2020-09-21 DIAGNOSIS — I10 Essential (primary) hypertension: Secondary | ICD-10-CM

## 2020-09-21 DIAGNOSIS — R55 Syncope and collapse: Secondary | ICD-10-CM

## 2020-09-21 DIAGNOSIS — E108 Type 1 diabetes mellitus with unspecified complications: Secondary | ICD-10-CM | POA: Diagnosis not present

## 2020-09-21 DIAGNOSIS — R8290 Unspecified abnormal findings in urine: Secondary | ICD-10-CM

## 2020-09-21 LAB — CBC
HCT: 46.7 % (ref 39.0–52.0)
Hemoglobin: 15.8 g/dL (ref 13.0–17.0)
MCHC: 33.8 g/dL (ref 30.0–36.0)
MCV: 90.9 fl (ref 78.0–100.0)
Platelets: 149 10*3/uL — ABNORMAL LOW (ref 150.0–400.0)
RBC: 5.14 Mil/uL (ref 4.22–5.81)
RDW: 14.6 % (ref 11.5–15.5)
WBC: 6.9 10*3/uL (ref 4.0–10.5)

## 2020-09-21 LAB — POCT URINALYSIS DIPSTICK
Bilirubin, UA: NEGATIVE
Blood, UA: NEGATIVE
Glucose, UA: NEGATIVE
Ketones, UA: NEGATIVE
Nitrite, UA: NEGATIVE
Protein, UA: POSITIVE — AB
Spec Grav, UA: 1.02 (ref 1.010–1.025)
Urobilinogen, UA: 0.2 E.U./dL
pH, UA: 6 (ref 5.0–8.0)

## 2020-09-21 LAB — BASIC METABOLIC PANEL
BUN: 23 mg/dL (ref 6–23)
CO2: 34 mEq/L — ABNORMAL HIGH (ref 19–32)
Calcium: 9.5 mg/dL (ref 8.4–10.5)
Chloride: 97 mEq/L (ref 96–112)
Creatinine, Ser: 1.82 mg/dL — ABNORMAL HIGH (ref 0.40–1.50)
GFR: 35.04 mL/min — ABNORMAL LOW (ref >60.00)
Glucose, Bld: 196 mg/dL — ABNORMAL HIGH (ref 70–99)
Potassium: 3.3 mEq/L — ABNORMAL LOW (ref 3.5–5.1)
Sodium: 139 mEq/L (ref 135–145)

## 2020-09-21 LAB — TSH: TSH: 2.98 u[IU]/mL (ref 0.35–4.50)

## 2020-09-21 MED ORDER — POTASSIUM CHLORIDE ER 10 MEQ PO TBCR: 10.0000 meq | EXTENDED_RELEASE_TABLET | Freq: Every day | ORAL | 0 refills | Status: DC

## 2020-09-21 NOTE — Progress Notes (Signed)
Acute Office Visit  Subjective:    Patient ID: [name], male    DOB: 11/20/1940, 79 y.o.   MRN: [id number]  Chief Complaint  Patient presents with  . Acute Visit    Since last ov patient has passed out twice 09-20-23 and 11/29) Pt states both times he was standing and then things just went black and it only last for a few seconds and then he is fine.     HPI Patient is in today with c/o nearly passing out twice in the last week. Patient reports getting very lightheaded, seeing the room go black, then losing his balance. He has been able to grab a hold of something to prevent a fall. Patient reports feeling strange in his head and having a headache since yesterday that is generalized 3/10. No blurred vision or double vision currently. But does report seeing flashing lights a few weeks ago that has resolved. He saw his eye doctor that did not see any issue. Patient denies chest pain, palpitations, SOB. Was seen by ENT for an earache recently but was no ear concerns were identified. Started Trulicity 4 weeks ago. Has a daughter who passed away suddenly of cardiovascular disease.     Past Medical History:  Diagnosis Date  . Allergy   . Anal fissure   . Anemia, unspecified   . Arthritis    Spinal OA  . BACK PAIN, LUMBAR 10/23/2007  . CARDIAC MURMUR, SYSTOLIC 10/24/4578  . DIABETES MELLITUS, TYPE I 05/20/2007  . DIASTOLIC DYSFUNCTION 9/98/3382  . 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 Duodenitis   . Edema 05/12/2008  . Esophageal stricture   . GERD (gastroesophageal reflux disease)   . 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 Gynecomastia   . Hepatic steatosis   . HYPERLIPIDEMIA 05/20/2007  . Hypertension   . Hypogonadism male   . Morbid obesity ([place]) 09/10/2009  . OBSTRUCTIVE SLEEP APNEA 11/04/2007  . Personal history of colonic polyps   . RENAL INSUFFICIENCY 05/12/2008  . Sleep apnea    uses cpap  . Squamous cell carcinoma of skin 02/16/2020   left lower leg, anterior-cx3,34f  .  Urolithiasis     Past Surgical History:  Procedure Laterality Date  . Abdominal UKorea 09/1997  . arm fracture Left 1958   with hardware  . Colon cancer screening    . COLONOSCOPY  08/18/2004   diverticulitis  . COLONOSCOPY  09/24/2009  . ELECTROCARDIOGRAM  02/2006  . FLEXIBLE SIGMOIDOSCOPY  03/04/2001   polyps, anal fissure  . GANGLION CYST EXCISION Left 1980  . Lower Arterial  04/13/2004  . POLYPECTOMY    . Rest Cardiolite  03/19/2003    Family History  Problem Relation Age of Onset  . Colon cancer Brother 585 . Colon polyps Brother   . Lung cancer Brother   . Other Mother        MVA, deceased 52s . Healthy Daughter   . Healthy Son   . Rectal cancer Neg Hx   . Stomach cancer Neg Hx     Social History   Socioeconomic History  . Marital status: Married    Spouse name: Not on file  . Number of children: Not on file  . Years of education: Not on file  . Highest education level: Not on file  Occupational History    Employer: [employer]  Tobacco Use  . Smoking status: Former Smoker    Packs/day: 2.00    Years: 31.00  Pack years: 62.00    Types: Cigarettes    Quit date: 10/23/1982    Years since quitting: 37.9  . Smokeless tobacco: Never Used  Vaping Use  . Vaping Use: Never used  Substance and Sexual Activity  . Alcohol use: No    Alcohol/week: 0.0 standard drinks  . Drug use: No  . Sexual activity: Not on file  Other Topics Concern  . Not on file  Social History Narrative   Lives with wife in a one story home.  Has a son and a daughter.     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     Education: 2 years of college.   Social Determinants of Health   Financial Resource Strain: Low Risk   . Difficulty of Paying Living Expenses: Not hard at all  Food Insecurity: No Food Insecurity  . Worried About Charity fundraiser in the Last Year: Never true  . Ran Out of Food in the Last Year: Never true  Transportation Needs: No Transportation  Needs  . Lack of Transportation (Medical): No  . Lack of Transportation (Non-Medical): No  Physical Activity:   . Days of Exercise per Week: Not on file  . Minutes of Exercise per Session: Not on file  Stress:   . Feeling of Stress : Not on file  Social Connections:   . Frequency of Communication with Friends and Family: Not on file  . Frequency of Social Gatherings with Friends and Family: Not on file  . Attends Religious Services: Not on file  . Active Member of Clubs or Organizations: Not on file  . Attends Archivist Meetings: Not on file  . Marital Status: Not on file  Intimate Partner Violence:   . Fear of Current or Ex-Partner: Not on file  . Emotionally Abused: Not on file  . Physically Abused: Not on file  . Sexually Abused: Not on file    Outpatient Medications Prior to Visit  Medication Sig Dispense Refill  . Accu-Chek FastClix Lancets MISC USE TO CHECK BLOOD SUGAR UP TO 6 TIMES DAILY 612 each 3  . alfuzosin (UROXATRAL) 10 MG 24 hr tablet TAKE 1 TABLET BY MOUTH  DAILY WITH BREAKFAST 90 tablet 3  . allopurinol (ZYLOPRIM) 300 MG tablet TAKE 1 TABLET BY MOUTH  DAILY 90 tablet 3  . amitriptyline (ELAVIL) 25 MG tablet TAKE 1 TABLET(25 MG) BY MOUTH AT BEDTIME 30 tablet 1  . aspirin (BAYER LOW STRENGTH) 81 MG EC tablet Take 81 mg by mouth daily.      . Blood Glucose Monitoring Suppl (ACCU-CHEK AVIVA PLUS) w/Device KIT Use as directed 1 kit 0  . carvedilol (COREG) 3.125 MG tablet TAKE 1/2 TABLET BY MOUTH 2  TIMES DAILY WITH MEALS 90 tablet 3  . cetirizine (ZYRTEC) 5 MG tablet Take 1 tablet (5 mg total) by mouth daily.    . Continuous Blood Gluc Sensor (DEXCOM G6 SENSOR) MISC Inject 1 Device into the skin as directed. Apply to skin SQ every 10 days 9 each 3  . cycloSPORINE (RESTASIS) 0.05 % ophthalmic emulsion Place 1 drop into both eyes 2 (two) times daily.      . diclofenac Sodium (VOLTAREN) 1 % GEL Apply 2 g topically 4 (four) times daily. 150 g 3  . Dulaglutide  (TRULICITY) 1.5 VZ/5.6LO SOPN Inject 1.5 mg into the skin once a week. 6 mL 3  . DULoxetine (CYMBALTA) 60 MG capsule TAKE 1 CAPSULE BY MOUTH  DAILY 30  capsule 11  . fluticasone (FLONASE) 50 MCG/ACT nasal spray Place 1 spray into the nose daily as needed for allergies.     . folic acid (FOLVITE) 1 MG tablet Take 2 mg by mouth 2 (two) times daily. 4 tabs daily    . furosemide (LASIX) 20 MG tablet TAKE 1 TABLET BY MOUTH  DAILY 90 tablet 3  . glucose blood (ACCU-CHEK AVIVA PLUS) test strip USE TO TEST BLOOD SUGAR 6  TIMES PER DAY; E11.9 600 each 11  . Insulin Infusion Pump (T: SLIM X2 INS PMP/CONTROL 7.4) DEVI by Does not apply route.    . Insulin Infusion Pump Supplies (AUTOSOFT XC INFUSION SET) MISC Inject 1 Act into the skin every other day. Use with 27m cannula and 23 inch tubing. *5 boxes (90 day supply) 5 each 3  . Insulin Infusion Pump Supplies (T:SLIM INSULIN CARTRIDGE 3ML) MISC Use with insulin pump, fill once every 2 days. 5 each 3  . insulin lispro (HUMALOG) 100 UNIT/ML injection For use in pump, total of 120 units per day 120 mL 3  . mometasone (ELOCON) 0.1 % lotion APPLY TOPICALLY DAILY 60 mL 11  . Multiple Vitamins-Minerals (CENTRUM SILVER PO) Take 1 tablet by mouth daily.      . naproxen (NAPROSYN) 500 MG tablet Take 1 tablet (500 mg total) by mouth 2 (two) times daily with a meal. 60 tablet 2  . Omega-3 Fatty Acids (FISH OIL) 1200 MG CAPS Take 1,200 mg by mouth daily.     .Marland Kitchenomeprazole (PRILOSEC) 20 MG capsule Take 1 capsule (20 mg total) by mouth daily as needed. 90 capsule 2  . PEG-KCl-NaCl-NaSulf-Na Asc-C (PLENVU) 140 g SOLR Take 140 g by mouth as directed. Manufacturer's coupon Universal coupon code:BIN: 0P2366821 GROUP: [id number] PCN: [id number]; ID:: [id number] PAY NO MORE $50 1 each 0  . rosuvastatin (CRESTOR) 40 MG tablet TAKE 1 TABLET BY MOUTH AT  BEDTIME 90 tablet 3  . vitamin B-12 (CYANOCOBALAMIN) 1000 MCG tablet Take 1,000 mcg by mouth daily.    . Vitamin D, Ergocalciferol,  (DRISDOL) 1.25 MG (50000 UT) CAPS capsule Take 1 capsule (50,000 Units total) by mouth every 7 (seven) days. 5 capsule 6   No facility-administered medications prior to visit.    Allergies  Allergen Reactions  . Atorvastatin Other (See Comments)    unknown  . Niacin     REACTION: Severe heartburn  . Pioglitazone     REACTION: Edema    Review of Systems  Constitutional: Negative.   HENT: Negative.   Eyes: Negative.   Respiratory: Negative for shortness of breath and wheezing.   Cardiovascular: Negative for chest pain and leg swelling.  Gastrointestinal: Negative.   Endocrine: Negative.   Genitourinary: Negative.   Musculoskeletal: Negative.   Skin: Negative.   Allergic/Immunologic: Negative.   Neurological: Positive for dizziness and light-headedness.  Hematological: Negative.   Psychiatric/Behavioral: Negative.   All other systems reviewed and are negative.      Objective:    Physical Exam Constitutional:      Appearance: He is obese.  HENT:     Head: Normocephalic and atraumatic.     Right Ear: Tympanic membrane normal.     Left Ear: Tympanic membrane normal.     Nose: Nose normal.     Mouth/Throat:     Mouth: Mucous membranes are moist.  Eyes:     Pupils: Pupils are equal, round, and reactive to light.  Cardiovascular:     Rate and Rhythm: Normal rate  and regular rhythm.     Pulses: Normal pulses.     Heart sounds: Normal heart sounds.  Pulmonary:     Effort: Pulmonary effort is normal.     Breath sounds: Normal breath sounds.  Abdominal:     General: Abdomen is flat. Bowel sounds are normal.     Palpations: Abdomen is soft.  Musculoskeletal:        General: Normal range of motion.     Cervical back: Normal range of motion and neck supple.  Skin:    General: Skin is warm and dry.  Neurological:     General: No focal deficit present.     Mental Status: He is alert and oriented to person, place, and time.  Psychiatric:        Mood and Affect: Mood  normal.        Behavior: Behavior normal.     BP (!) 110/58 (BP Location: Left Arm, Patient Position: Sitting, Cuff Size: Large)   Pulse 92   Temp 97.8 F (36.6 C) (Temporal)   Ht 6' (1.829 m)   Wt 280 lb 9.6 oz (127.3 kg)   SpO2 97%   BMI 38.06 kg/m  Wt Readings from Last 3 Encounters:  09/21/20 280 lb 9.6 oz (127.3 kg)  09/14/20 281 lb 9.6 oz (127.7 kg)  08/12/20 280 lb (127 kg)    Health Maintenance Due  Topic Date Due  . Hepatitis C Screening  Never done  . TETANUS/TDAP  10/27/2018  . COLONOSCOPY  05/04/2020  . INFLUENZA VACCINE  05/23/2020  . URINE MICROALBUMIN  07/09/2020    There are no preventive care reminders to display for this patient.   Lab Results  Component Value Date   TSH 2.99 10/09/2019   Lab Results  Component Value Date   WBC 5.3 07/12/2020   HGB 13.6 07/12/2020   HCT 40.8 07/12/2020   MCV 93.4 07/12/2020   PLT 130.0 (L) 07/12/2020   Lab Results  Component Value Date   NA 142 07/12/2020   K 3.8 07/12/2020   CO2 31 07/12/2020   GLUCOSE 135 (H) 07/12/2020   BUN 19 07/12/2020   CREATININE 1.51 (H) 07/12/2020   BILITOT 0.6 10/09/2019   ALKPHOS 85 10/09/2019   AST 15 10/09/2019   ALT 18 10/09/2019   PROT 6.4 10/09/2019   ALBUMIN 4.1 10/09/2019   CALCIUM 8.7 07/12/2020   ANIONGAP 8 11/28/2014   GFR 44.83 (L) 07/12/2020   Lab Results  Component Value Date   CHOL 116 07/10/2019   Lab Results  Component Value Date   HDL 32.20 (L) 07/10/2019   Lab Results  Component Value Date   LDLCALC 57 07/10/2019   Lab Results  Component Value Date   TRIG 137.0 07/10/2019   Lab Results  Component Value Date   CHOLHDL 4 07/10/2019   Lab Results  Component Value Date   HGBA1C 6.7 (A) 08/12/2020       Assessment & Plan:   Problem List Items Addressed This Visit    MORBID OBESITY   Essential hypertension    Other Visit Diagnoses    Near syncope    -  Primary   Relevant Orders   EKG 85-IDPO   Basic Metabolic Panel (BMET)   CBC    TSH   POC Urinalysis Dipstick   Type 1 diabetes mellitus with complications ([place])       Relevant Orders   Basic Metabolic Panel (BMET)   CBC   POC Urinalysis Dipstick  EKG shows changes from 2018 EKG to include RBBB. Will refer to Cardiology. No acute changes noted. Patient to go to the ED if symptoms return.   No orders of the defined types were placed in this encounter.    Kennyth Arnold, FNP

## 2020-09-21 NOTE — Patient Instructions (Signed)
Near-Syncope Near-syncope is when you suddenly get weak or dizzy, or you feel like you might pass out (faint). This may also be called presyncope. This is due to a lack of blood flow to the brain. During an episode of near-syncope, you may:  Feel dizzy, weak, or light-headed.  Feel sick to your stomach (nauseous).  See all white or all black.  See spots.  Have cold, clammy skin. This condition is caused by a sudden decrease in blood flow to the brain. This decrease can result from various causes, but most of those causes are not dangerous. However, near-syncope may be a sign of a serious medical problem, so it is important to seek medical care. Follow these instructions at home: Medicines  Take over-the-counter and prescription medicines only as told by your doctor.  If you are taking blood pressure or heart medicine, get up slowly and spend many minutes getting ready to sit and then stand. This can help with dizziness. General instructions  Be aware of any changes in your symptoms.  Talk with your doctor about your symptoms. You may need to have testing to find the cause of your near-syncope.  If you start to feel like you might pass out, lie down right away. Raise (elevate) your feet above the level of your heart. Breathe deeply and steadily. Wait until all of the symptoms are gone.  Have someone stay with you until you feel stable.  Do not drive, use machinery, or play sports until your doctor says it is okay.  Drink enough fluid to keep your pee (urine) pale yellow.  Keep all follow-up visits as told by your doctor. This is important. Get help right away if you:  Have a seizure.  Have pain in your: ? Chest. ? Belly (abdomen). ? Back.  Faint once or more than once.  Have a very bad headache.  Are bleeding from your mouth or butt.  Have black or tarry poop (stool).  Have a very fast or uneven heartbeat (palpitations).  Are mixed up (confused).  Have trouble  walking.  Are very weak.  Have trouble seeing. These symptoms may be an emergency. Do not wait to see if the symptoms will go away. Get medical help right away. Call your local emergency services (911 in the [place] Do not drive yourself to the hospital. Summary  Near-syncope is when you suddenly get weak or dizzy, or you feel like you might pass out (faint).  This condition is caused by a lack of blood flow to the brain.  Near-syncope may be a sign of a serious medical problem, so it is important to seek medical care. This information is not intended to replace advice given to you by your health care provider. Make sure you discuss any questions you have with your health care provider. Document Revised: 01/31/2019 Document Reviewed: 08/28/2018 Elsevier Patient Education  2020 [employer]  

## 2020-09-22 ENCOUNTER — Encounter: Payer: Medicare Other | Admitting: Gastroenterology

## 2020-09-22 ENCOUNTER — Other Ambulatory Visit: Payer: Self-pay | Admitting: Family

## 2020-09-22 LAB — URINE CULTURE
MICRO NUMBER:: 11257670
Result:: NO GROWTH
SPECIMEN QUALITY:: ADEQUATE

## 2020-09-23 ENCOUNTER — Encounter: Payer: Self-pay | Admitting: Internal Medicine

## 2020-09-23 ENCOUNTER — Encounter: Payer: Medicare Other | Admitting: Physical Medicine & Rehabilitation

## 2020-09-23 ENCOUNTER — Other Ambulatory Visit: Payer: Self-pay

## 2020-09-23 ENCOUNTER — Telehealth: Payer: Self-pay | Admitting: Radiology

## 2020-09-23 ENCOUNTER — Ambulatory Visit: Payer: Medicare Other | Admitting: Internal Medicine

## 2020-09-23 ENCOUNTER — Other Ambulatory Visit: Payer: Self-pay | Admitting: Family

## 2020-09-23 VITALS — BP 138/64 | HR 92 | Ht 72.0 in | Wt 275.8 lb

## 2020-09-23 DIAGNOSIS — E7849 Other hyperlipidemia: Secondary | ICD-10-CM

## 2020-09-23 DIAGNOSIS — R55 Syncope and collapse: Secondary | ICD-10-CM | POA: Diagnosis not present

## 2020-09-23 DIAGNOSIS — E669 Obesity, unspecified: Secondary | ICD-10-CM | POA: Insufficient documentation

## 2020-09-23 DIAGNOSIS — E1169 Type 2 diabetes mellitus with other specified complication: Secondary | ICD-10-CM | POA: Diagnosis not present

## 2020-09-23 DIAGNOSIS — I152 Hypertension secondary to endocrine disorders: Secondary | ICD-10-CM

## 2020-09-23 DIAGNOSIS — E782 Mixed hyperlipidemia: Secondary | ICD-10-CM | POA: Insufficient documentation

## 2020-09-23 DIAGNOSIS — E1159 Type 2 diabetes mellitus with other circulatory complications: Secondary | ICD-10-CM

## 2020-09-23 DIAGNOSIS — E119 Type 2 diabetes mellitus without complications: Secondary | ICD-10-CM | POA: Insufficient documentation

## 2020-09-23 NOTE — Progress Notes (Signed)
Cardiology Office Note:    Date:  09/23/2020   ID:  [name], DOB 07-05-1941, MRN [id number]  PCP:  Libby Maw, MD  [place] Cardiologist:  No primary care provider on file.  [facility] Electrophysiologist:  None   CC: Near-syncope Consulted for the evaluation of syncope at the behest of Libby Maw, MD  History of Present Illness:    [name] is a 79 y.o. male with a hx of Type 1 Diabetes with HTN, HLD, Morbid Obesity, OSA on CPAP, CKD IIIa, known systolic heart murmur, who presents for evaluation.  Patient notes that he has feeling of near syncope.  Was standing, and had a quick near blackout while talking with a friend (Thanksgiving).  Had another episode with standing had syncope.  Has had chest pain after spicy food, no exertional chest pain, chest pressure, chest tightness, chest stinging.  Discomfort only after eating spicy food and never with exertion, but the pain sometimes radiates down his left arm. Patient doesn't really exert himself, but when going up stairs does not have chest pain.  Notes stable DOE since age 80 that occurs when going upstairs.  No PND or orthopnea.  No bendopnea, notes weight loss, had has no leg swelling, or abdominal swelling.  Had never had cardiac syncope.  In 2010 had defecation related syncope at work.  Had hospitalization without heart issues.  Notes no palpitations but feels funny heart beats in his neck.     Patient reports prior cardiac testing including 2010 echo, 2004 stress test (reportedly ok),  heart catheterizations,  cardioversion,  ablations.  Ambulatory BP 128/75 (only one value).   Past Medical History:  Diagnosis Date  . Allergy   . Anal fissure   . Anemia, unspecified   . Arthritis    Spinal OA  . BACK PAIN, LUMBAR 10/23/2007  . CARDIAC MURMUR, SYSTOLIC 01/24/346  . DIABETES MELLITUS, TYPE I 05/20/2007  . DIASTOLIC DYSFUNCTION 02/14/9562  . 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[place])    . Duodenitis   . Edema 05/12/2008  . Esophageal stricture   . GERD (gastroesophageal reflux disease)   . 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 Gynecomastia   . Hepatic steatosis   . HYPERLIPIDEMIA 05/20/2007  . Hypertension   . Hypogonadism male   . Morbid obesity ([place]) 09/10/2009  . OBSTRUCTIVE SLEEP APNEA 11/04/2007  . Personal history of colonic polyps   . RENAL INSUFFICIENCY 05/12/2008  . Sleep apnea    uses cpap  . Squamous cell carcinoma of skin 02/16/2020   left lower leg, anterior-cx3,100f  . Urolithiasis     Past Surgical History:  Procedure Laterality Date  . Abdominal [place] 09/1997  . arm fracture Left 1958   with hardware  . Colon cancer screening    . COLONOSCOPY  08/18/2004   diverticulitis  . COLONOSCOPY  09/24/2009  . ELECTROCARDIOGRAM  02/2006  . FLEXIBLE SIGMOIDOSCOPY  03/04/2001   polyps, anal fissure  . GANGLION CYST EXCISION Left 1980  . Lower Arterial  04/13/2004  . POLYPECTOMY    . Rest Cardiolite  03/19/2003    Current Medications: Current Meds  Medication Sig  . Accu-Chek FastClix Lancets MISC USE TO CHECK BLOOD SUGAR UP TO 6 TIMES DAILY  . alfuzosin (UROXATRAL) 10 MG 24 hr tablet TAKE 1 TABLET BY MOUTH  DAILY WITH BREAKFAST  . allopurinol (ZYLOPRIM) 300 MG tablet TAKE 1 TABLET BY MOUTH  DAILY  . amitriptyline (ELAVIL) 25 MG tablet TAKE 1 TABLET(25  MG) BY MOUTH AT BEDTIME  . aspirin (BAYER LOW STRENGTH) 81 MG EC tablet Take 81 mg by mouth daily.    . Blood Glucose Monitoring Suppl (ACCU-CHEK AVIVA PLUS) w/Device KIT Use as directed  . cetirizine (ZYRTEC) 5 MG tablet Take 1 tablet (5 mg total) by mouth daily.  . Continuous Blood Gluc Sensor (DEXCOM G6 SENSOR) MISC Inject 1 Device into the skin as directed. Apply to skin SQ every 10 days  . cycloSPORINE (RESTASIS) 0.05 % ophthalmic emulsion Place 1 drop into both eyes 2 (two) times daily.    . diclofenac Sodium (VOLTAREN) 1 % GEL Apply 2 g topically 4 (four) times daily.  . Dulaglutide (TRULICITY) 1.5 MO/2.9UT SOPN  Inject 1.5 mg into the skin once a week.  . DULoxetine (CYMBALTA) 60 MG capsule TAKE 1 CAPSULE BY MOUTH  DAILY  . fluticasone (FLONASE) 50 MCG/ACT nasal spray Place 1 spray into the nose daily as needed for allergies.   . folic acid (FOLVITE) 1 MG tablet Take 2 mg by mouth 2 (two) times daily. 4 tabs daily  . furosemide (LASIX) 20 MG tablet TAKE 1 TABLET BY MOUTH  DAILY  . glucose blood (ACCU-CHEK AVIVA PLUS) test strip USE TO TEST BLOOD SUGAR 6  TIMES PER DAY; E11.9  . Insulin Infusion Pump (T: SLIM X2 INS PMP/CONTROL 7.4) DEVI by Does not apply route.  . Insulin Infusion Pump Supplies (AUTOSOFT XC INFUSION SET) MISC Inject 1 Act into the skin every other day. Use with 11m cannula and 23 inch tubing. *5 boxes (90 day supply)  . Insulin Infusion Pump Supplies (T:SLIM INSULIN CARTRIDGE 3ML) MISC Use with insulin pump, fill once every 2 days.  . insulin lispro (HUMALOG) 100 UNIT/ML injection For use in pump, total of 120 units per day  . mometasone (ELOCON) 0.1 % lotion APPLY TOPICALLY DAILY  . Multiple Vitamins-Minerals (CENTRUM SILVER PO) Take 1 tablet by mouth daily.    . naproxen (NAPROSYN) 500 MG tablet Take 1 tablet (500 mg total) by mouth 2 (two) times daily with a meal.  . Omega-3 Fatty Acids (FISH OIL) 1200 MG CAPS Take 1,200 mg by mouth daily.   .Marland Kitchenomeprazole (PRILOSEC) 20 MG capsule Take 1 capsule (20 mg total) by mouth daily as needed.  .Marland KitchenPEG-KCl-NaCl-NaSulf-Na Asc-C (PLENVU) 140 g SOLR Take 140 g by mouth as directed. Manufacturer's coupon Universal coupon code:BIN: 0P2366821 GROUP: [id number] PCN: [id number]; ID:: [id number] PAY NO MORE $50  . potassium chloride (KLOR-CON) 10 MEQ tablet TAKE 1 TABLET(10 MEQ) BY MOUTH DAILY  . rosuvastatin (CRESTOR) 40 MG tablet TAKE 1 TABLET BY MOUTH AT  BEDTIME  . vitamin B-12 (CYANOCOBALAMIN) 1000 MCG tablet Take 1,000 mcg by mouth daily.  . Vitamin D, Ergocalciferol, (DRISDOL) 1.25 MG (50000 UT) CAPS capsule Take 1 capsule (50,000 Units total) by mouth  every 7 (seven) days.  . [DISCONTINUED] carvedilol (COREG) 3.125 MG tablet TAKE 1/2 TABLET BY MOUTH 2  TIMES DAILY WITH MEALS     Allergies:   Atorvastatin, Niacin, and Pioglitazone   Social History   Socioeconomic History  . Marital status: Married    Spouse name: Not on file  . Number of children: Not on file  . Years of education: Not on file  . Highest education level: Not on file  Occupational History    Employer: [employer]  Tobacco Use  . Smoking status: Former Smoker    Packs/day: 2.00    Years: 31.00    Pack years: 62.00  Types: Cigarettes    Quit date: 10/23/1982    Years since quitting: 37.9  . Smokeless tobacco: Never Used  Vaping Use  . Vaping Use: Never used  Substance and Sexual Activity  . Alcohol use: No    Alcohol/week: 0.0 standard drinks  . Drug use: No  . Sexual activity: Not on file  Other Topics Concern  . Not on file  Social History Narrative   Lives with wife in a one story home.  Has a son and a daughter.     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     Education: 2 years of college.   Social Determinants of Health   Financial Resource Strain: Low Risk   . Difficulty of Paying Living Expenses: Not hard at all  Food Insecurity: No Food Insecurity  . Worried About Charity fundraiser in the Last Year: Never true  . Ran Out of Food in the Last Year: Never true  Transportation Needs: No Transportation Needs  . Lack of Transportation (Medical): No  . Lack of Transportation (Non-Medical): No  Physical Activity:   . Days of Exercise per Week: Not on file  . Minutes of Exercise per Session: Not on file  Stress:   . Feeling of Stress : Not on file  Social Connections:   . Frequency of Communication with Friends and Family: Not on file  . Frequency of Social Gatherings with Friends and Family: Not on file  . Attends Religious Services: Not on file  . Active Member of Clubs or Organizations: Not on file  . Attends Theatre manager Meetings: Not on file  . Marital Status: Not on file    Family History: The patient's family history includes Colon cancer (age of onset: 76) in his brother; Colon polyps in his brother; Healthy in his daughter and son; Lung cancer in his brother; Other in his mother. There is no history of Rectal cancer or Stomach cancer. No heart disease in the family  ROS:   Please see the history of present illness.    All other systems reviewed and are negative.  EKGs/Labs/Other Studies Reviewed:    The following studies were reviewed today:  EKG:   09/21/2020:  SR 1st degree HB (PR 236) RBBB; BiFB  Recent Labs: 10/09/2019: ALT 18 09/21/2020: BUN 23; Creatinine, Ser 1.82; Hemoglobin 15.8; Platelets 149.0; Potassium 3.3; Sodium 139; TSH 2.98  Recent Lipid Panel    Component Value Date/Time   CHOL 116 07/10/2019 0929   TRIG 137.0 07/10/2019 0929   HDL 32.20 (L) 07/10/2019 0929   CHOLHDL 4 07/10/2019 0929   VLDL 27.4 07/10/2019 0929   LDLCALC 57 07/10/2019 0929   LDLDIRECT 63.0 07/12/2020 1119   SUMMARY 2010 Echo Report Only - Overall left ventricular systolic function was normal. Left     ventricular ejection fraction was estimated , range being 60     % to 65 %. There were no left ventricular regional wall     motion abnormalities. Features were consistent with diastolic     dysfunction.    Risk Assessment/Calculations:     N/A  Physical Exam:    VS:  BP 138/64   Pulse 92   Ht 6' (1.829 m)   Wt 275 lb 12.8 oz (125.1 kg)   SpO2 96%   BMI 37.41 kg/m     Wt Readings from Last 3 Encounters:  09/23/20 275 lb 12.8 oz (125.1 kg)  09/21/20 280 lb 9.6 oz (  127.3 kg)  09/14/20 281 lb 9.6 oz (127.7 kg)     GEN:  Well nourished, well developed in no acute distress HEENT: Normal NECK: No JVD; No carotid bruits LYMPHATICS: No lymphadenopathy CARDIAC: RRR, no murmurs, rubs, gallops RESPIRATORY:  Clear to auscultation without rales, wheezing or  rhonchi  ABDOMEN: Soft, non-tender, non-distended MUSCULOSKELETAL:  No edema; No deformity  SKIN: Warm and dry, insulin pump site c/d/i NEUROLOGIC:  Alert and oriented x 3 PSYCHIATRIC:  Normal affect   ASSESSMENT:    1. Syncope and collapse   2. Obesity, diabetes, and hypertension syndrome ([place])   3. Other hyperlipidemia    PLAN:    In order of problems listed above:  Syncope CKD - without orthostatic hypotension - with possible reflex medication hypotension  - vasovagal syncope syndrome described  - history of situational syncope related to defecation in the past (2010)  -with risk of bradyarrhythmia (Bifascular Block) - Will get Echocardiogram - Will get Live [employer] Monitor for syncope - With resolution of either HB concerns or ir escalation of chest pain; will proceed with ischemic eval (discussed with patient) - stopping COREG  Obesity/DM/HTN - ambulatory blood pressure 120/70, will continue ambulatory BP monitoring; gave education on how to perform ambulatory blood pressure monitoring including the frequency and technique; goal ambulatory blood pressure < 135/85 on average - continue home medications with the exception of COREG as above - discussed diet (DASH/low sodium), and exercise/weight loss interventions  Hyperlipidemia -LDL goal less than 100 -continue current statin - gave education on dietary changes  6-8 weeks after the heart monitor follow up unless new symptoms or abnormal test results warranting change in plan  Would be reasonable for  APP Follow up   Shared Decision Making/Informed Consent      Patient amenable to plan.  Medication Adjustments/Labs and Tests Ordered: Current medicines are reviewed at length with the patient today.  Concerns regarding medicines are outlined above.  Orders Placed This Encounter  Procedures  . CARDIAC EVENT MONITOR  . ECHOCARDIOGRAM COMPLETE   No orders of the defined types were placed in this  encounter.   Patient Instructions  Medication Instructions:    STOP TAKING COREG 3.125 MG    *If you need a refill on your cardiac medications before your next appointment, please call your pharmacy*   Lab Work: NONE ORDERED  TODAY   If you have labs (blood work) drawn today and your tests are completely normal, you will receive your results only by: Marland Kitchen MyChart Message (if you have MyChart) OR . A paper copy in the mail If you have any lab test that is abnormal or we need to change your treatment, we will call you to review the results.   Testing/Procedures: Your physician has requested that you have an echocardiogram. Echocardiography is a painless test that uses sound waves to create images of your heart. It provides your doctor with information about the size and shape of your heart and how well your heart's chambers and valves are working. This procedure takes approximately one hour. There are no restrictions for this procedure.  Your physician has recommended that you wear an event monitor. Event monitors are medical devices that record the heart's electrical activity. Doctors most often [place] these monitors to diagnose arrhythmias. Arrhythmias are problems with the speed or rhythm of the heartbeat. The monitor is a small, portable device. You can wear one while you do your normal daily activities. This is usually used to diagnose what is  causing palpitations/syncope (passing out).    Follow-Up: At [facility], you and your health needs are our priority.  As part of our continuing mission to provide you with exceptional heart care, we have created designated Provider Care Teams.  These Care Teams include your primary Cardiologist (physician) and Advanced Practice Providers (APPs -  Physician Assistants and Nurse Practitioners) who all work together to provide you with the care you need, when you need it.  We recommend signing up for the patient portal called "MyChart".  Sign up  information is provided on this After Visit Summary.  MyChart is used to connect with patients for Virtual Visits (Telemedicine).  Patients are able to view lab/test results, encounter notes, upcoming appointments, etc.  Non-urgent messages can be sent to your provider as well.   To learn more about what you can do with MyChart, go to [email or web address].    Your next appointment:   6-8 week(s)  The format for your next appointment:   In Person  Provider:   Rudean Haskell, MD  Other Instructions  Pre[employer]ice Cardiac Event Monitor Instructions Your physician has requested you wear your cardiac event monitor for ___30__ days, (1-30). [employer] may call or text to confirm a shipping address. The monitor will be sent to a land address via UPS. Preventice will not ship a monitor to a PO BOX. It typically takes 3-5 days to receive your monitor after it has been enrolled. [employer] will assist with USPS tracking if your package is delayed. The telephone number for [employer] is [phone]. Once you have received your monitor, please review the enclosed instructions. Instruction tutorials can also be viewed under help and settings on the enclosed cell phone. Your monitor has already been registered assigning a specific monitor serial # to you.  Applying the monitor Remove cell phone from case and turn it on. The cell phone works as Dealer and needs to be within [employer] of you at all times. The cell phone will need to be charged on a daily basis. We recommend you plug the cell phone into the enclosed charger at your bedside table every night.  Monitor batteries: You will receive two monitor batteries labelled #1 and #2. These are your recorders. Plug battery #2 onto the second connection on the enclosed charger. Keep one battery on the charger at all times. This will keep the monitor battery deactivated. It will also keep it fully charged for when you need to  switch your monitor batteries. A small light will be blinking on the battery emblem when it is charging. The light on the battery emblem will remain on when the battery is fully charged.  Open package of a Monitor strip. Insert battery #1 into black hood on strip and gently squeeze monitor battery onto connection as indicated in instruction booklet. Set aside while preparing skin.  Choose location for your strip, vertical or horizontal, as indicated in the instruction booklet. Shave to remove all hair from location. There cannot be any lotions, oils, powders, or colognes on skin where monitor is to be applied. Wipe skin clean with enclosed Saline wipe. Dry skin completely.  Peel paper labeled #1 off the back of the Monitor strip exposing the adhesive. Place the monitor on the chest in the vertical or horizontal position shown in the instruction booklet. One arrow on the monitor strip must be pointing upward. Carefully remove paper labeled #2, attaching remainder of strip to your skin. Try not to create any folds or wrinkles in  the strip as you apply it.  Firmly press and release the circle in the center of the monitor battery. You will hear a small beep. This is turning the monitor battery on. The heart emblem on the monitor battery will light up every 5 seconds if the monitor battery in turned on and connected to the patient securely. Do not push and hold the circle down as this turns the monitor battery off. The cell phone will locate the monitor battery. A screen will appear on the cell phone checking the connection of your monitor strip. This may read poor connection initially but change to good connection within the next minute. Once your monitor accepts the connection you will hear a series of 3 beeps followed by a climbing crescendo of beeps. A screen will appear on the cell phone showing the two monitor strip placement options. Touch the picture that demonstrates where you applied the  monitor strip.  Your monitor strip and battery are waterproof. You are able to shower, bathe, or swim with the monitor on. They just ask you do not submerge deeper than 3 feet underwater. We recommend removing the monitor if you are swimming in a lake, river, or ocean.  Your monitor battery will need to be switched to a fully charged monitor battery approximately once a week. The cell phone will alert you of an action which needs to be made.  On the cell phone, tap for details to reveal connection status, monitor battery status, and cell phone battery status. The green dots indicates your monitor is in good status. A red dot indicates there is something that needs your attention.  To record a symptom, click the circle on the monitor battery. In 30-60 seconds a list of symptoms will appear on the cell phone. Select your symptom and tap save. Your monitor will record a sustained or significant arrhythmia regardless of you clicking the button. Some patients do not feel the heart rhythm irregularities. [employer] will notify us of any serious or critical events.  Refer to instruction booklet for instructions on switching batteries, changing strips, the Do not disturb or Pause features, or any additional questions.  Call [employer] at [phone], to confirm your monitor is transmitting and record your baseline. They will answer any questions you may have regarding the monitor instructions at that time.  Returning the monitor to [place] all equipment back into blue box. Peel off strip of paper to expose adhesive and close box securely. There is a prepaid UPS shipping label on this box. Drop in a UPS drop box, or at a UPS facility like [employer]. You may also contact [employer] to arrange UPS to pick up monitor package at your home.    Signed, Werner Lean, MD  09/23/2020 11:05 AM    [place]

## 2020-09-23 NOTE — Telephone Encounter (Signed)
Enrolled patient for a 30 day [employer] Event Monitor to be mailed to patients home  

## 2020-09-23 NOTE — Patient Instructions (Addendum)
Medication Instructions:    STOP TAKING COREG 3.125 MG    *If you need a refill on your cardiac medications before your next appointment, please call your pharmacy*   Lab Work: NONE ORDERED  TODAY   If you have labs (blood work) drawn today and your tests are completely normal, you will receive your results only by: Marland Kitchen MyChart Message (if you have MyChart) OR . A paper copy in the mail If you have any lab test that is abnormal or we need to change your treatment, we will call you to review the results.   Testing/Procedures: Your physician has requested that you have an echocardiogram. Echocardiography is a painless test that uses sound waves to create images of your heart. It provides your doctor with information about the size and shape of your heart and how well your heart's chambers and valves are working. This procedure takes approximately one hour. There are no restrictions for this procedure.  Your physician has recommended that you wear an event monitor. Event monitors are medical devices that record the heart's electrical activity. Doctors most often [place] these monitors to diagnose arrhythmias. Arrhythmias are problems with the speed or rhythm of the heartbeat. The monitor is a small, portable device. You can wear one while you do your normal daily activities. This is usually used to diagnose what is causing palpitations/syncope (passing out).    Follow-Up: At [facility], you and your health needs are our priority.  As part of our continuing mission to provide you with exceptional heart care, we have created designated Provider Care Teams.  These Care Teams include your primary Cardiologist (physician) and Advanced Practice Providers (APPs -  Physician Assistants and Nurse Practitioners) who all work together to provide you with the care you need, when you need it.  We recommend signing up for the patient portal called "MyChart".  Sign up information is provided on this After  Visit Summary.  MyChart is used to connect with patients for Virtual Visits (Telemedicine).  Patients are able to view lab/test results, encounter notes, upcoming appointments, etc.  Non-urgent messages can be sent to your provider as well.   To learn more about what you can do with MyChart, go to [email or web address].    Your next appointment:   6-8 week(s)  The format for your next appointment:   In Person  Provider:   Rudean Haskell, MD  Other Instructions  [employer] Cardiac Event Monitor Instructions Your physician has requested you wear your cardiac event monitor for ___30__ days, (1-30). [employer] may call or text to confirm a shipping address. The monitor will be sent to a land address via UPS. [employer] will not ship a monitor to a PO BOX. It typically takes 3-5 days to receive your monitor after it has been enrolled. [employer] will assist with USPS tracking if your package is delayed. The telephone number for [employer] is [phone]. Once you have received your monitor, please review the enclosed instructions. Instruction tutorials can also be viewed under help and settings on the enclosed cell phone. Your monitor has already been registered assigning a specific monitor serial # to you.  Applying the monitor Remove cell phone from case and turn it on. The cell phone works as Dealer and needs to be within [employer] of you at all times. The cell phone will need to be charged on a daily basis. We recommend you plug the cell phone into the enclosed charger at your bedside table every night.  Monitor  batteries: You will receive two monitor batteries labelled #1 and #2. These are your recorders. Plug battery #2 onto the second connection on the enclosed charger. Keep one battery on the charger at all times. This will keep the monitor battery deactivated. It will also keep it fully charged for when you need to switch your monitor batteries. A small light  will be blinking on the battery emblem when it is charging. The light on the battery emblem will remain on when the battery is fully charged.  Open package of a Monitor strip. Insert battery #1 into black hood on strip and gently squeeze monitor battery onto connection as indicated in instruction booklet. Set aside while preparing skin.  Choose location for your strip, vertical or horizontal, as indicated in the instruction booklet. Shave to remove all hair from location. There cannot be any lotions, oils, powders, or colognes on skin where monitor is to be applied. Wipe skin clean with enclosed Saline wipe. Dry skin completely.  Peel paper labeled #1 off the back of the Monitor strip exposing the adhesive. Place the monitor on the chest in the vertical or horizontal position shown in the instruction booklet. One arrow on the monitor strip must be pointing upward. Carefully remove paper labeled #2, attaching remainder of strip to your skin. Try not to create any folds or wrinkles in the strip as you apply it.  Firmly press and release the circle in the center of the monitor battery. You will hear a small beep. This is turning the monitor battery on. The heart emblem on the monitor battery will light up every 5 seconds if the monitor battery in turned on and connected to the patient securely. Do not push and hold the circle down as this turns the monitor battery off. The cell phone will locate the monitor battery. A screen will appear on the cell phone checking the connection of your monitor strip. This may read poor connection initially but change to good connection within the next minute. Once your monitor accepts the connection you will hear a series of 3 beeps followed by a climbing crescendo of beeps. A screen will appear on the cell phone showing the two monitor strip placement options. Touch the picture that demonstrates where you applied the monitor strip.  Your monitor strip and  battery are waterproof. You are able to shower, bathe, or swim with the monitor on. They just ask you do not submerge deeper than 3 feet underwater. We recommend removing the monitor if you are swimming in a lake, river, or ocean.  Your monitor battery will need to be switched to a fully charged monitor battery approximately once a week. The cell phone will alert you of an action which needs to be made.  On the cell phone, tap for details to reveal connection status, monitor battery status, and cell phone battery status. The green dots indicates your monitor is in good status. A red dot indicates there is something that needs your attention.  To record a symptom, click the circle on the monitor battery. In 30-60 seconds a list of symptoms will appear on the cell phone. Select your symptom and tap save. Your monitor will record a sustained or significant arrhythmia regardless of you clicking the button. Some patients do not feel the heart rhythm irregularities. [employer] will notify us of any serious or critical events.  Refer to instruction booklet for instructions on switching batteries, changing strips, the Do not disturb or Pause features, or any additional  questions.  Call [employer] at [phone], to confirm your monitor is transmitting and record your baseline. They will answer any questions you may have regarding the monitor instructions at that time.  Returning the monitor to [place] all equipment back into blue box. Peel off strip of paper to expose adhesive and close box securely. There is a prepaid UPS shipping label on this box. Drop in a UPS drop box, or at a UPS facility like [employer]. You may also contact [employer] to arrange UPS to pick up monitor package at your home.

## 2020-09-27 ENCOUNTER — Other Ambulatory Visit: Payer: Self-pay

## 2020-09-28 ENCOUNTER — Other Ambulatory Visit (INDEPENDENT_AMBULATORY_CARE_PROVIDER_SITE_OTHER): Payer: Medicare Other

## 2020-09-28 ENCOUNTER — Other Ambulatory Visit: Payer: Self-pay | Admitting: Family

## 2020-09-28 ENCOUNTER — Ambulatory Visit (INDEPENDENT_AMBULATORY_CARE_PROVIDER_SITE_OTHER): Payer: Medicare Other

## 2020-09-28 DIAGNOSIS — R55 Syncope and collapse: Secondary | ICD-10-CM | POA: Diagnosis not present

## 2020-09-28 DIAGNOSIS — E876 Hypokalemia: Secondary | ICD-10-CM

## 2020-09-28 DIAGNOSIS — E108 Type 1 diabetes mellitus with unspecified complications: Secondary | ICD-10-CM | POA: Diagnosis not present

## 2020-09-28 LAB — BASIC METABOLIC PANEL
BUN: 23 mg/dL (ref 6–23)
CO2: 31 mEq/L (ref 19–32)
Calcium: 9.5 mg/dL (ref 8.4–10.5)
Chloride: 97 mEq/L (ref 96–112)
Creatinine, Ser: 1.86 mg/dL — ABNORMAL HIGH (ref 0.40–1.50)
GFR: 34.13 mL/min — ABNORMAL LOW (ref >60.00)
Glucose, Bld: 183 mg/dL — ABNORMAL HIGH (ref 70–99)
Potassium: 3.1 mEq/L — ABNORMAL LOW (ref 3.5–5.1)
Sodium: 138 mEq/L (ref 135–145)

## 2020-09-28 LAB — URINALYSIS, ROUTINE W REFLEX MICROSCOPIC
Bilirubin Urine: NEGATIVE
Hgb urine dipstick: NEGATIVE
Ketones, ur: NEGATIVE
Nitrite: NEGATIVE
RBC / HPF: NONE SEEN (ref 0–?)
Specific Gravity, Urine: 1.02 (ref 1.000–1.030)
Urine Glucose: NEGATIVE
Urobilinogen, UA: 0.2 (ref 0.0–1.0)
pH: 6 (ref 5.0–8.0)

## 2020-10-04 ENCOUNTER — Encounter: Payer: Medicare Other | Attending: Physical Medicine & Rehabilitation | Admitting: Physical Medicine & Rehabilitation

## 2020-10-04 ENCOUNTER — Other Ambulatory Visit: Payer: Self-pay

## 2020-10-04 ENCOUNTER — Encounter: Payer: Self-pay | Admitting: Physical Medicine & Rehabilitation

## 2020-10-04 VITALS — BP 119/72 | HR 88 | Temp 98.5°F | Ht 72.0 in | Wt 281.0 lb

## 2020-10-04 DIAGNOSIS — M791 Myalgia, unspecified site: Secondary | ICD-10-CM | POA: Diagnosis present

## 2020-10-04 NOTE — Progress Notes (Signed)
Trigger point injection procedure note: Trigger Point Injection: Written consent was obtained for the patient. Trigger points were identified of R>L lumbosacral PSPs muscles (x3). The areas were cleaned with alcohol, vapocoolant spray applied, needle drawback performed, and each of  these trigger points were injected with 1 cc of 0.5% Marcaine. There were no complications from the procedure, and it was well tolerated.

## 2020-10-06 ENCOUNTER — Other Ambulatory Visit: Payer: Medicare Other

## 2020-10-06 ENCOUNTER — Other Ambulatory Visit: Payer: Self-pay | Admitting: Nephrology

## 2020-10-06 DIAGNOSIS — N179 Acute kidney failure, unspecified: Secondary | ICD-10-CM

## 2020-10-06 DIAGNOSIS — N1832 Chronic kidney disease, stage 3b: Secondary | ICD-10-CM

## 2020-10-07 ENCOUNTER — Other Ambulatory Visit: Payer: Self-pay | Admitting: Family Medicine

## 2020-10-07 DIAGNOSIS — N401 Enlarged prostate with lower urinary tract symptoms: Secondary | ICD-10-CM

## 2020-10-08 NOTE — Telephone Encounter (Signed)
Last OV 09/21/20 Last fill 11/08/19  #90/3

## 2020-10-18 ENCOUNTER — Ambulatory Visit: Payer: Medicare Other | Admitting: Family Medicine

## 2020-10-20 ENCOUNTER — Ambulatory Visit (HOSPITAL_COMMUNITY): Payer: Medicare Other | Attending: Internal Medicine

## 2020-10-20 ENCOUNTER — Other Ambulatory Visit: Payer: Self-pay

## 2020-10-20 DIAGNOSIS — R55 Syncope and collapse: Secondary | ICD-10-CM | POA: Diagnosis not present

## 2020-10-20 LAB — ECHOCARDIOGRAM COMPLETE
Area-P 1/2: 3.91 cm2
S' Lateral: 2.2 cm

## 2020-10-21 ENCOUNTER — Encounter: Payer: Self-pay | Admitting: Physical Medicine & Rehabilitation

## 2020-10-21 ENCOUNTER — Other Ambulatory Visit: Payer: Self-pay

## 2020-10-21 ENCOUNTER — Encounter: Payer: Medicare Other | Admitting: Physical Medicine & Rehabilitation

## 2020-10-21 ENCOUNTER — Telehealth: Payer: Self-pay | Admitting: *Deleted

## 2020-10-21 VITALS — BP 155/76 | HR 86 | Temp 97.9°F | Ht 72.0 in | Wt 278.4 lb

## 2020-10-21 DIAGNOSIS — I77810 Thoracic aortic ectasia: Secondary | ICD-10-CM

## 2020-10-21 DIAGNOSIS — M791 Myalgia, unspecified site: Secondary | ICD-10-CM

## 2020-10-21 NOTE — Telephone Encounter (Signed)
-----   Message from Werner Lean, MD sent at 10/20/2020  1:51 PM EST ----- Results: Normal BiV Function Plan: Repeat Echo in 6 months to one year (borderline dilation of the aortic root)  Werner Lean, MD

## 2020-10-21 NOTE — Telephone Encounter (Signed)
The patient has been notified of the result and verbalized understanding.  All questions (if any) were answered. Darrell Jewel, RN 10/21/2020 10:29 AM   Order placed and made patient aware of future testing.

## 2020-10-21 NOTE — Progress Notes (Signed)
Trigger point injection procedure note: Trigger Point Injection: Written consent was obtained for the patient. Trigger points were identified of R>L lumbosacral PSPs muscles (x4). The areas were cleaned with alcohol, vapocoolant spray applied, needle drawback performed, and each of  these trigger points were injected with 1 cc of 0.5% Marcaine. There were no complications from the procedure, and it was well tolerated.

## 2020-10-29 ENCOUNTER — Other Ambulatory Visit: Payer: Self-pay | Admitting: Physical Medicine & Rehabilitation

## 2020-11-01 ENCOUNTER — Ambulatory Visit
Admission: RE | Admit: 2020-11-01 | Discharge: 2020-11-01 | Disposition: A | Discharge status: Home / Self Care | Payer: Medicare Other | Source: Ambulatory Visit | Attending: Nephrology | Admitting: Nephrology

## 2020-11-01 DIAGNOSIS — N179 Acute kidney failure, unspecified: Secondary | ICD-10-CM

## 2020-11-01 DIAGNOSIS — N1832 Chronic kidney disease, stage 3b: Secondary | ICD-10-CM

## 2020-11-11 ENCOUNTER — Ambulatory Visit: Payer: Medicare Other | Admitting: Family Medicine

## 2020-11-15 ENCOUNTER — Other Ambulatory Visit: Payer: Self-pay

## 2020-11-17 ENCOUNTER — Other Ambulatory Visit: Payer: Self-pay

## 2020-11-17 ENCOUNTER — Ambulatory Visit: Payer: Medicare Other | Admitting: Endocrinology

## 2020-11-17 ENCOUNTER — Ambulatory Visit: Payer: Medicare Other | Admitting: Internal Medicine

## 2020-11-17 ENCOUNTER — Encounter: Payer: Self-pay | Admitting: Internal Medicine

## 2020-11-17 VITALS — BP 138/70 | HR 82 | Ht 72.0 in | Wt 277.4 lb

## 2020-11-17 VITALS — BP 130/68 | HR 79 | Ht 72.0 in | Wt 276.0 lb

## 2020-11-17 DIAGNOSIS — I7121 Aneurysm of the ascending aorta, without rupture: Secondary | ICD-10-CM | POA: Insufficient documentation

## 2020-11-17 DIAGNOSIS — E669 Obesity, unspecified: Secondary | ICD-10-CM

## 2020-11-17 DIAGNOSIS — E1122 Type 2 diabetes mellitus with diabetic chronic kidney disease: Secondary | ICD-10-CM

## 2020-11-17 DIAGNOSIS — N1831 Chronic kidney disease, stage 3a: Secondary | ICD-10-CM | POA: Diagnosis not present

## 2020-11-17 DIAGNOSIS — I712 Thoracic aortic aneurysm, without rupture: Secondary | ICD-10-CM

## 2020-11-17 DIAGNOSIS — I700 Atherosclerosis of aorta: Secondary | ICD-10-CM

## 2020-11-17 DIAGNOSIS — Z794 Long term (current) use of insulin: Secondary | ICD-10-CM

## 2020-11-17 DIAGNOSIS — I2584 Coronary atherosclerosis due to calcified coronary lesion: Secondary | ICD-10-CM

## 2020-11-17 DIAGNOSIS — I152 Hypertension secondary to endocrine disorders: Secondary | ICD-10-CM

## 2020-11-17 DIAGNOSIS — E1169 Type 2 diabetes mellitus with other specified complication: Secondary | ICD-10-CM

## 2020-11-17 DIAGNOSIS — N184 Chronic kidney disease, stage 4 (severe): Secondary | ICD-10-CM | POA: Diagnosis not present

## 2020-11-17 DIAGNOSIS — I2510 Atherosclerotic heart disease of native coronary artery without angina pectoris: Secondary | ICD-10-CM | POA: Insufficient documentation

## 2020-11-17 DIAGNOSIS — E1159 Type 2 diabetes mellitus with other circulatory complications: Secondary | ICD-10-CM

## 2020-11-17 DIAGNOSIS — E0800 Diabetes mellitus due to underlying condition with hyperosmolarity without nonketotic hyperglycemic-hyperosmolar coma (NKHHC): Secondary | ICD-10-CM

## 2020-11-17 DIAGNOSIS — E876 Hypokalemia: Secondary | ICD-10-CM | POA: Insufficient documentation

## 2020-11-17 LAB — POCT GLYCOSYLATED HEMOGLOBIN (HGB A1C): Hemoglobin A1C: 6.8 % — AB (ref 4.0–5.6)

## 2020-11-17 MED ORDER — TRULICITY 3 MG/0.5ML ~~LOC~~ SOAJ: 3.0000 mg | SUBCUTANEOUS | 3 refills | Status: DC

## 2020-11-17 NOTE — Progress Notes (Signed)
Subjective:    Patient ID: [name], male    DOB: [birth year], 80 y.o.   MRN: [id number]  HPI Pt returns for f/u of diabetes mellitus:  DM type: Insulin-requiring type 2.   Dx'ed: 0355 Complications: PN, CAD, and stage 4 CRI.   Therapy: insulin since 1995.  DKA: never.  Severe hypoglycemia: never.  Pancreatitis: never.  Other: he started pump therapy (now T-slim, since mid-2015), and dexcom G-6 continuous glucose monitor.   Interval history: he takes these pump settings:   basal rate of 1.9 units/hr, 24 hrs per day.   mealtime bolus of 1 unit/3 grams carbohydrate. continue correction bolus (which some people call "sensitivity," or "insulin sensitivity ratio," or just "isr") of 1 unit for each 40 by which your glucose exceeds 100.   "Control-IQ" is on 99% of the time TDD is approx 89 units (65% basal, 30% meal bolus, 1% correction bolus).   I reviewed continuous glucose monitor data.  Glucose varies from 77-293.  It is in general higher PC than AC (mostly lunch), but there is not much trend throughout the day.  Glucose falls slightly throughout the night. 2 mos ago, he had syncope, but glucose was not low then.  He says new dosage of Trulicity is expensive. Past Medical History:  Diagnosis Date  . Allergy   . Anal fissure   . Anemia, unspecified   . Arthritis    Spinal OA  . BACK PAIN, LUMBAR 10/23/2007  . CARDIAC MURMUR, SYSTOLIC 06/29/4162  . DIABETES MELLITUS, TYPE I 05/20/2007  . DIASTOLIC DYSFUNCTION 8/45/3646  . 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 Duodenitis   . Edema 05/12/2008  . Esophageal stricture   . GERD (gastroesophageal reflux disease)   . 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 Gynecomastia   . Hepatic steatosis   . HYPERLIPIDEMIA 05/20/2007  . Hypertension   . Hypogonadism male   . Morbid obesity ([place]) 09/10/2009  . OBSTRUCTIVE SLEEP APNEA 11/04/2007  . Personal history of colonic polyps   . RENAL INSUFFICIENCY 05/12/2008  . Sleep apnea    uses cpap  .  Squamous cell carcinoma of skin 02/16/2020   left lower leg, anterior-cx3,39f  . Urolithiasis     Past Surgical History:  Procedure Laterality Date  . Abdominal [place] 09/1997  . arm fracture Left 1958   with hardware  . Colon cancer screening    . COLONOSCOPY  08/18/2004   diverticulitis  . COLONOSCOPY  09/24/2009  . ELECTROCARDIOGRAM  02/2006  . FLEXIBLE SIGMOIDOSCOPY  03/04/2001   polyps, anal fissure  . GANGLION CYST EXCISION Left 1980  . Lower Arterial  04/13/2004  . POLYPECTOMY    . Rest Cardiolite  03/19/2003    Social History   Socioeconomic History  . Marital status: Married    Spouse name: Not on file  . Number of children: Not on file  . Years of education: Not on file  . Highest education level: Not on file  Occupational History    Employer: [employer]  Tobacco Use  . Smoking status: Former Smoker    Packs/day: 2.00    Years: 31.00    Pack years: 62.00    Types: Cigarettes    Quit date: 10/23/1982    Years since quitting: 38.1  . Smokeless tobacco: Never Used  Vaping Use  . Vaping Use: Never used  Substance and Sexual Activity  . Alcohol use: No    Alcohol/week: 0.0 standard drinks  . Drug use:  No  . Sexual activity: Not on file  Other Topics Concern  . Not on file  Social History Narrative   Lives with wife in a one story home.  Has a son and a daughter.     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     Education: 2 years of college.   Social Determinants of Health   Financial Resource Strain: Low Risk   . Difficulty of Paying Living Expenses: Not hard at all  Food Insecurity: No Food Insecurity  . Worried About Charity fundraiser in the Last Year: Never true  . Ran Out of Food in the Last Year: Never true  Transportation Needs: No Transportation Needs  . Lack of Transportation (Medical): No  . Lack of Transportation (Non-Medical): No  Physical Activity: Not on file  Stress: Not on file  Social Connections: Not on file   Intimate Partner Violence: Not on file    Current Outpatient Medications on File Prior to Visit  Medication Sig Dispense Refill  . Accu-Chek FastClix Lancets MISC USE TO CHECK BLOOD SUGAR UP TO 6 TIMES DAILY 612 each 3  . alfuzosin (UROXATRAL) 10 MG 24 hr tablet TAKE 1 TABLET BY MOUTH  DAILY WITH BREAKFAST 90 tablet 3  . allopurinol (ZYLOPRIM) 300 MG tablet TAKE 1 TABLET BY MOUTH  DAILY 90 tablet 3  . amitriptyline (ELAVIL) 25 MG tablet TAKE 1 TABLET(25 MG) BY MOUTH AT BEDTIME 30 tablet 1  . aspirin 81 MG EC tablet Take 81 mg by mouth daily.    . Blood Glucose Monitoring Suppl (ACCU-CHEK AVIVA PLUS) w/Device KIT Use as directed 1 kit 0  . cetirizine (ZYRTEC) 5 MG tablet Take 1 tablet (5 mg total) by mouth daily.    . Continuous Blood Gluc Sensor (DEXCOM G6 SENSOR) MISC Inject 1 Device into the skin as directed. Apply to skin SQ every 10 days 9 each 3  . diclofenac Sodium (VOLTAREN) 1 % GEL Apply 2 g topically 4 (four) times daily. 150 g 3  . DULoxetine (CYMBALTA) 60 MG capsule TAKE 1 CAPSULE BY MOUTH  DAILY 90 capsule 3  . fluticasone (FLONASE) 50 MCG/ACT nasal spray Place 1 spray into the nose daily as needed for allergies.     . folic acid (FOLVITE) 1 MG tablet Take 2 mg by mouth 2 (two) times daily. 4 tabs daily    . glucose blood (ACCU-CHEK AVIVA PLUS) test strip USE TO TEST BLOOD SUGAR 6  TIMES PER DAY; E11.9 600 each 11  . Insulin Infusion Pump (T: SLIM X2 INS PMP/CONTROL 7.4) DEVI by Does not apply route.    . Insulin Infusion Pump Supplies (AUTOSOFT XC INFUSION SET) MISC Inject 1 Act into the skin every other day. Use with 35m cannula and 23 inch tubing. *5 boxes (90 day supply) 5 each 3  . Insulin Infusion Pump Supplies (T:SLIM INSULIN CARTRIDGE 3ML) MISC Use with insulin pump, fill once every 2 days. 5 each 3  . insulin lispro (HUMALOG) 100 UNIT/ML injection For use in pump, total of 120 units per day 120 mL 3  . mometasone (ELOCON) 0.1 % lotion APPLY TOPICALLY DAILY 60 mL 11  .  Multiple Vitamins-Minerals (CENTRUM SILVER PO) Take 1 tablet by mouth daily.    . Omega-3 Fatty Acids (FISH OIL) 1200 MG CAPS Take 1,200 mg by mouth daily.    .Marland Kitchenomeprazole (PRILOSEC) 20 MG capsule Take 1 capsule (20 mg total) by mouth daily as needed. 90 capsule  2  . PEG-KCl-NaCl-NaSulf-Na Asc-C (PLENVU) 140 g SOLR Take 140 g by mouth as directed. Manufacturer's coupon Universal coupon code:BIN: P2366821; GROUP: [id number]; PCN: [id number]; ID: [id number]; PAY NO MORE $50 1 each 0  . potassium chloride (KLOR-CON) 10 MEQ tablet TAKE 1 TABLET(10 MEQ) BY MOUTH DAILY 90 tablet 0  . rosuvastatin (CRESTOR) 40 MG tablet TAKE 1 TABLET BY MOUTH AT  BEDTIME 90 tablet 3  . vitamin B-12 (CYANOCOBALAMIN) 1000 MCG tablet Take 1,000 mcg by mouth daily.     No current facility-administered medications on file prior to visit.    Allergies  Allergen Reactions  . Atorvastatin Other (See Comments)    unknown  . Niacin     REACTION: Severe heartburn  . Pioglitazone     REACTION: Edema    Family History  Problem Relation Age of Onset  . Colon cancer Brother 20  . Colon polyps Brother   . Lung cancer Brother   . Other Mother        MVA, deceased 72s  . Healthy Daughter   . Healthy Son   . Rectal cancer Neg Hx   . Stomach cancer Neg Hx     BP 138/70 (BP Location: Right Arm, Patient Position: Sitting, Cuff Size: Large)   Pulse 82   Ht 6' (1.829 m)   Wt 277 lb 6.4 oz (125.8 kg)   SpO2 96%   BMI 37.62 kg/m    Review of Systems Denies nausea and hypoglycemia    Objective:   Physical Exam VITAL SIGNS:  See vs page GENERAL: no distress Pulses: dorsalis pedis intact bilat.   MSK: no deformity of the feet CV: 1+ bilat leg edema Skin:  no ulcer on the feet.  normal color and temp on the feet. Neuro: sensation is intact to touch on the feet, but decreased from normal.   Lab Results  Component Value Date   HGBA1C 6.8 (A) 11/17/2020       Assessment & Plan:  Insulin-requiring type 2 DM, with  stage 4 CRI. well-controlled  Patient Instructions  Please take these pump settings:  basal rate of 1.7 units/hr, 24 hrs per day.   mealtime bolus of 1 unit/4 grams carbohydrate.  continue correction bolus (which some people call "sensitivity," or "insulin sensitivity ratio," or just "isr") of 1 unit for each 40 by which your glucose exceeds 100.   I have sent a prescription to your pharmacy, to double the Trulicity again.  Please call if this is expensive, so we can try changing to Ozempic.   Please come back for a follow-up appointment in 3 months.

## 2020-11-17 NOTE — Progress Notes (Signed)
Cardiology Office Note:    Date:  11/17/2020   ID:  Garo Heidelberg, DOB 07-15-1941, MRN [id number]  PCP:  Libby Maw, MD  [facility] Cardiologist:  Werner Lean, MD  [place] Electrophysiologist:  None   CC: follow up near syncope  History of Present Illness:    Chas[place] Yepiz is a 80 y.o. male with a hx of Type 1 Diabetes with HTN, HLD, Morbid Obesity, OSA on CPAP, CKD IIIa, known systolic heart murmur, who presented for evaluation 09/23/20.  Had prior passing out spells for which ZioPatch and Echo were performed without significant findings.    Patient notes that he is doing sitting around and waiting for the winter to go away.  Since last visit notes no other syncope.  There are no interval hospital/ED visit.    No chest pain or pressure .  No SOB/DOE and no PND/Orthopnea.  No weight gain or leg swelling.  No palpitations or syncope .   Past Medical History:  Diagnosis Date  . Allergy   . Anal fissure   . Anemia, unspecified   . Arthritis    Spinal OA  . BACK PAIN, LUMBAR 10/23/2007  . CARDIAC MURMUR, SYSTOLIC 0/12/1592  . DIABETES MELLITUS, TYPE I 05/20/2007  . DIASTOLIC DYSFUNCTION 5/85/9292  . 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[place])   . Duodenitis   . Edema 05/12/2008  . Esophageal stricture   . GERD (gastroesophageal reflux disease)   . 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 Gynecomastia   . Hepatic steatosis   . HYPERLIPIDEMIA 05/20/2007  . Hypertension   . Hypogonadism male   . Morbid obesity ([place]) 09/10/2009  . OBSTRUCTIVE SLEEP APNEA 11/04/2007  . Personal history of colonic polyps   . RENAL INSUFFICIENCY 05/12/2008  . Sleep apnea    uses cpap  . Squamous cell carcinoma of skin 02/16/2020   left lower leg, anterior-cx3,29f  . Urolithiasis     Past Surgical History:  Procedure Laterality Date  . Abdominal [place] 09/1997  . arm fracture Left 1958   with hardware  . Colon cancer screening    . COLONOSCOPY  08/18/2004   diverticulitis  .  COLONOSCOPY  09/24/2009  . ELECTROCARDIOGRAM  02/2006  . FLEXIBLE SIGMOIDOSCOPY  03/04/2001   polyps, anal fissure  . GANGLION CYST EXCISION Left 1980  . Lower Arterial  04/13/2004  . POLYPECTOMY    . Rest Cardiolite  03/19/2003    Current Medications: Current Meds  Medication Sig  . Accu-Chek FastClix Lancets MISC USE TO CHECK BLOOD SUGAR UP TO 6 TIMES DAILY  . alfuzosin (UROXATRAL) 10 MG 24 hr tablet TAKE 1 TABLET BY MOUTH  DAILY WITH BREAKFAST  . allopurinol (ZYLOPRIM) 300 MG tablet TAKE 1 TABLET BY MOUTH  DAILY  . amitriptyline (ELAVIL) 25 MG tablet TAKE 1 TABLET(25 MG) BY MOUTH AT BEDTIME  . aspirin 81 MG EC tablet Take 81 mg by mouth daily.  . Blood Glucose Monitoring Suppl (ACCU-CHEK AVIVA PLUS) w/Device KIT Use as directed  . cetirizine (ZYRTEC) 5 MG tablet Take 1 tablet (5 mg total) by mouth daily.  . Continuous Blood Gluc Sensor (DEXCOM G6 SENSOR) MISC Inject 1 Device into the skin as directed. Apply to skin SQ every 10 days  . diclofenac Sodium (VOLTAREN) 1 % GEL Apply 2 g topically 4 (four) times daily.  . Dulaglutide (TRULICITY) 3 MKM/6.2MMSOPN Inject 3 mg as directed once a week.  . DULoxetine (CYMBALTA) 60 MG capsule TAKE 1 CAPSULE BY  MOUTH  DAILY  . fluticasone (FLONASE) 50 MCG/ACT nasal spray Place 1 spray into the nose daily as needed for allergies.   . folic acid (FOLVITE) 1 MG tablet Take 2 mg by mouth 2 (two) times daily. 4 tabs daily  . glucose blood (ACCU-CHEK AVIVA PLUS) test strip USE TO TEST BLOOD SUGAR 6  TIMES PER DAY; E11.9  . Insulin Infusion Pump (T: SLIM X2 INS PMP/CONTROL 7.4) DEVI by Does not apply route.  . Insulin Infusion Pump Supplies (AUTOSOFT XC INFUSION SET) MISC Inject 1 Act into the skin every other day. Use with 61m cannula and 23 inch tubing. *5 boxes (90 day supply)  . Insulin Infusion Pump Supplies (T:SLIM INSULIN CARTRIDGE 3ML) MISC Use with insulin pump, fill once every 2 days.  . insulin lispro (HUMALOG) 100 UNIT/ML injection For use in pump,  total of 120 units per day  . mometasone (ELOCON) 0.1 % lotion APPLY TOPICALLY DAILY  . Multiple Vitamins-Minerals (CENTRUM SILVER PO) Take 1 tablet by mouth daily.  . Omega-3 Fatty Acids (FISH OIL) 1200 MG CAPS Take 1,200 mg by mouth daily.  .Marland Kitchenomeprazole (PRILOSEC) 20 MG capsule Take 1 capsule (20 mg total) by mouth daily as needed.  .Marland KitchenPEG-KCl-NaCl-NaSulf-Na Asc-C (PLENVU) 140 g SOLR Take 140 g by mouth as directed. Manufacturer's coupon Universal coupon code:BIN: 0P2366821 GROUP: [id number] PCN: [id number]; ID:: [id number] PAY NO MORE $50  . potassium chloride (KLOR-CON) 10 MEQ tablet TAKE 1 TABLET(10 MEQ) BY MOUTH DAILY  . rosuvastatin (CRESTOR) 40 MG tablet TAKE 1 TABLET BY MOUTH AT  BEDTIME  . vitamin B-12 (CYANOCOBALAMIN) 1000 MCG tablet Take 1,000 mcg by mouth daily.     Allergies:   Atorvastatin, Niacin, and Pioglitazone   Social History   Socioeconomic History  . Marital status: Married    Spouse name: Not on file  . Number of children: Not on file  . Years of education: Not on file  . Highest education level: Not on file  Occupational History    Employer: [employer]  Tobacco Use  . Smoking status: Former Smoker    Packs/day: 2.00    Years: 31.00    Pack years: 62.00    Types: Cigarettes    Quit date: 10/23/1982    Years since quitting: 38.0  . Smokeless tobacco: Never Used  Vaping Use  . Vaping Use: Never used  Substance and Sexual Activity  . Alcohol use: No    Alcohol/week: 0.0 standard drinks  . Drug use: No  . Sexual activity: Not on file  Other Topics Concern  . Not on file  Social History Narrative   Lives with wife in a one story home.  Has a son and a daughter.     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   Social Determinants of Health   Financial Resource Strain: Low Risk   . Difficulty of Paying Living Expenses: Not hard at all  Food Insecurity: No Food Insecurity  . Worried About RSales executivein the Last Year: Never true  . Ran Out of Food in the Last Year: Never true  Transportation Needs: No Transportation Needs  . Lack of Transportation (Medical): No  . Lack of Transportation (Non-Medical): No  Physical Activity: Not on file  Stress: Not on file  Social Connections: Not on file    Family History: The patient's family history includes Colon cancer (age of onset: 532 in  his brother; Colon polyps in his brother; Healthy in his daughter and son; Lung cancer in his brother; Other in his mother. There is no history of Rectal cancer or Stomach cancer. History of coronary artery disease notable for no members. History of heart failure notable for no members. History of arrhythmia notable for no members. Denies family history of sudden cardiac death including drowning, car accidents, or unexplained deaths in the family. No history of bicuspid aortic valve or aortic aneurysm or dissection.  ROS:   Please see the history of present illness.    All other systems reviewed and are negative.  EKGs/Labs/Other Studies Reviewed:    The following studies were reviewed today:  EKG:   09/21/2020:  SR 1st degree HB (PR 236) RBBB; BiFB  Cardiac Event Monitoring: Date:11/03/20 Personally Reviewed Results:  Patient had a minimum heart rate of 59 bpm, maximum heart rate of 136 bpm, and average heart rate of 76 bpm.  Predominant underlying rhythm was sinus rhythm.  Isolated PACs were rare (<1.0%).  Isolated PVCs were rare (<1.0%).  No evidence of complete heart block or atrial fibrillation.  Triggered and diary events associated with sinus rhythm and sinus tachycardia.   No malignant arrhythmias.  Transthoracic Echocardiogram: Date: 10/20/21 Personally Reviewed Results: Ascending aortic index : 2.07 cm/m2 1. Left ventricular ejection fraction, by estimation, is 55 to 60%. The  left ventricle has normal function. The left ventricle has no regional  wall motion  abnormalities. Left ventricular diastolic parameters are  consistent with Grade I diastolic  dysfunction (impaired relaxation).  2. Right ventricular systolic function is normal. The right ventricular  size is normal.  3. The mitral valve is grossly normal. No evidence of mitral valve  regurgitation.  4. The aortic valve is tricuspid. There is mild thickening of the aortic  valve. Aortic valve regurgitation is not visualized. No aortic stenosis is  present.  5. Aortic dilatation noted. There is borderline dilatation of the aortic  root, measuring 38 mm.   Comparison(s): No prior Echocardiogram.   Conclusion(s)/Recommendation(s): Normal biventricular function without  evidence of hemodynamically significant valvular heart disease.   NonCardiac CT: Date: 12/31/2015 Results: Aortic Atherosclerosis (Arch) with LAD and mRCA calcification; Non-Con Ascending Aorta ~ 34 mm  Outside Labs:  Recent Labs: 09/21/2020: Hemoglobin 15.8; Platelets 149.0; TSH 2.98 09/28/2020: BUN 23; Creatinine, Ser 1.86; Potassium 3.1; Sodium 138  Recent Lipid Panel    Component Value Date/Time   CHOL 116 07/10/2019 0929   TRIG 137.0 07/10/2019 0929   HDL 32.20 (L) 07/10/2019 0929   CHOLHDL 4 07/10/2019 0929   VLDL 27.4 07/10/2019 0929   LDLCALC 57 07/10/2019 0929   LDLDIRECT 63.0 07/12/2020 1119    Risk Assessment/Calculations:     N/A  Physical Exam:    VS:  BP 130/68   Pulse 79   Ht 6' (1.829 m)   Wt 276 lb (125.2 kg)   SpO2 96%   BMI 37.43 kg/m     Wt Readings from Last 3 Encounters:  11/17/20 276 lb (125.2 kg)  11/17/20 277 lb 6.4 oz (125.8 kg)  10/21/20 278 lb 6.4 oz (126.3 kg)     GEN:  Well nourished, well developed in no acute distress HEENT: Normal NECK: No JVD; No carotid bruits LYMPHATICS: No lymphadenopathy CARDIAC: RRR, no murmurs, rubs, gallops RESPIRATORY:  Clear to auscultation without rales, wheezing or rhonchi  ABDOMEN: Soft, non-tender,  non-distended MUSCULOSKELETAL:  No edema; No deformity  SKIN: Warm and dry, insulin pump site c/d/i  NEUROLOGIC:  Alert and oriented x 3 PSYCHIATRIC:  Normal affect   ASSESSMENT:    1. 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HCC)   2. Obesity, diabetes, and hypertension syndrome (HCC)   3. Stage 3a chronic kidney disease ([place])   4. Aortic atherosclerosis ([place])   5. Coronary artery calcification   6. Hypokalemia    PLAN:    In order of problems listed above:  Borderline Ascending Aortic Aneurysm - no evidence of genetic aortopathy and borderline finding - will repeat echo in 6 months  Obesity/DM/HTN Orthostatic Hypotension and syncope - resolved off Coreg - ambulatory blood pressure 130/60, will continue ambulatory BP monitoring; gave education on how to perform ambulatory blood pressure monitoring including the frequency and technique; goal ambulatory blood pressure < 135/85 on average - continue home medications  (on no anti-hypertensive) - discussed diet (DASH/low sodium), and exercise/weight loss interventions  Hyperlipidemia- Morbid Obesity Aortic Atherosclerosis Coronary Artery Califications -LDL goal less than 70 -continue current statin - gave education on dietary changes  Hypokalemia CKD III A - will check BMP and if elevated will return K for one prescription (normally followed by Nephrology)  7 month follow up unless new symptoms or abnormal test results warranting change in plan  Would be reasonable for  Virtual Follow up  Would be reasonable for  APP Follow up   Shared Decision Making/Informed Consent      Patient amenable to plan.  Medication Adjustments/Labs and Tests Ordered: Current medicines are reviewed at length with the patient today.  Concerns regarding medicines are outlined above.  No orders of the defined types were placed in this encounter.  No orders of the defined types were placed in this encounter.   Patient Instructions  Medication  Instructions:  Your provider recommends that you continue on your current medications as directed. Please refer to the Current Medication list given to you today.   *If you need a refill on your cardiac medications before your next appointment, please call your pharmacy*  Labs: TODAY! BMET  Testing/Procedures: Your physician has requested that you have an echocardiogram. Echocardiography is a painless test that uses sound waves to create images of your heart. It provides your doctor with information about the size and shape of your heart and how well your heart's chambers and valves are working. This procedure takes approximately one hour. There are no restrictions for this procedure.  Follow-Up: At [facility], you and your health needs are our priority.  As part of our continuing mission to provide you with exceptional heart care, we have created designated Provider Care Teams.  These Care Teams include your primary Cardiologist (physician) and Advanced Practice Providers (APPs -  Physician Assistants and Nurse Practitioners) who all work together to provide you with the care you need, when you need it. Your next appointment:   6 month(s) The format for your next appointment:   In Person Provider:   Dr. Gasper Sells     Signed, Werner Lean, MD  11/17/2020 9:52 AM    [place]

## 2020-11-17 NOTE — Patient Instructions (Addendum)
Please take these pump settings:  basal rate of 1.7 units/hr, 24 hrs per day.   mealtime bolus of 1 unit/4 grams carbohydrate.  continue correction bolus (which some people call "sensitivity," or "insulin sensitivity ratio," or just "isr") of 1 unit for each 40 by which your glucose exceeds 100.   I have sent a prescription to your pharmacy, to double the Trulicity again.  Please call if this is expensive, so we can try changing to Ozempic.   Please come back for a follow-up appointment in 3 months.

## 2020-11-17 NOTE — Patient Instructions (Addendum)
Medication Instructions:  Your provider recommends that you continue on your current medications as directed. Please refer to the Current Medication list given to you today.   *If you need a refill on your cardiac medications before your next appointment, please call your pharmacy*  Labs: TODAY! BMET  Testing/Procedures: Your physician has requested that you have an echocardiogram. Echocardiography is a painless test that uses sound waves to create images of your heart. It provides your doctor with information about the size and shape of your heart and how well your heart's chambers and valves are working. This procedure takes approximately one hour. There are no restrictions for this procedure.  Follow-Up: At [facility], you and your health needs are our priority.  As part of our continuing mission to provide you with exceptional heart care, we have created designated Provider Care Teams.  These Care Teams include your primary Cardiologist (physician) and Advanced Practice Providers (APPs -  Physician Assistants and Nurse Practitioners) who all work together to provide you with the care you need, when you need it. Your next appointment:   6 month(s) The format for your next appointment:   In Person Provider:   Dr. Gasper Sells

## 2020-11-18 LAB — BASIC METABOLIC PANEL
BUN/Creatinine Ratio: 9 — ABNORMAL LOW (ref 10–24)
BUN: 15 mg/dL (ref 8–27)
CO2: 26 mmol/L (ref 20–29)
Calcium: 9.3 mg/dL (ref 8.6–10.2)
Chloride: 105 mmol/L (ref 96–106)
Creatinine, Ser: 1.62 mg/dL — ABNORMAL HIGH (ref 0.76–1.27)
GFR calc Af Amer: 46 mL/min/{1.73_m2} — ABNORMAL LOW (ref >59)
GFR calc non Af Amer: 40 mL/min/{1.73_m2} — ABNORMAL LOW (ref >59)
Glucose: 133 mg/dL — ABNORMAL HIGH (ref 65–99)
Potassium: 4.2 mmol/L (ref 3.5–5.2)
Sodium: 146 mmol/L — ABNORMAL HIGH (ref 134–144)

## 2020-12-15 ENCOUNTER — Encounter: Payer: Self-pay | Admitting: Physician Assistant

## 2020-12-15 ENCOUNTER — Other Ambulatory Visit: Payer: Self-pay

## 2020-12-15 ENCOUNTER — Ambulatory Visit: Payer: Medicare Other | Admitting: Physician Assistant

## 2020-12-15 DIAGNOSIS — Z86007 Personal history of in-situ neoplasm of skin: Secondary | ICD-10-CM

## 2020-12-15 NOTE — Progress Notes (Signed)
[facility] urologist thinks 3 cyst touching on kidneys

## 2020-12-17 ENCOUNTER — Ambulatory Visit: Payer: Medicare Other | Admitting: Physician Assistant

## 2020-12-20 ENCOUNTER — Ambulatory Visit: Payer: Medicare Other | Admitting: Physician Assistant

## 2020-12-27 ENCOUNTER — Other Ambulatory Visit: Payer: Self-pay | Admitting: Family Medicine

## 2021-01-03 ENCOUNTER — Encounter (INDEPENDENT_AMBULATORY_CARE_PROVIDER_SITE_OTHER): Payer: Self-pay

## 2021-01-03 NOTE — Progress Notes (Cosign Needed Addendum)
Subjective:   [name] is a 80 y.o. male who presents for Medicare Annual/Subsequent preventive examination.  Review of Systems     Cardiac Risk Factors include: advanced age (>42mn, >>7women);diabetes mellitus;dyslipidemia;hypertension;obesity (BMI >30kg/m2);sedentary lifestyle     Objective:    Today's Vitals   01/04/21 1312  BP: 134/70  Pulse: 77  Resp: 16  Temp: (!) 97.3 F (36.3 C)  TempSrc: Temporal  SpO2: 96%  Weight: 278 lb 12.8 oz (126.5 kg)  Height: 6' (1.829 m)   Body mass index is 37.81 kg/m.  Advanced Directives 01/04/2021 12/31/2019 05/05/2019 07/03/2017 06/20/2017 04/19/2017 03/30/2017  Does Patient Have a Medical Advance Directive? Yes No No No Yes Yes Yes  Type of Advance Directive Living will;Healthcare Power of [place]airsLiving will [place]oult[place] will [place] will  Does patient want to make changes to medical advance directive? - - - - - - -  Copy of [place] Chart? No - copy requested - - - No - copy requested - -  Would patient like information on creating a medical advance directive? - No - Patient declined No - Patient declined - - - -    Current Medications (verified) Outpatient Encounter Medications as of 01/04/2021  Medication Sig  . Accu-Chek FastClix Lancets MISC USE TO CHECK BLOOD SUGAR UP TO 6 TIMES DAILY  . alfuzosin (UROXATRAL) 10 MG 24 hr tablet TAKE 1 TABLET BY MOUTH  DAILY WITH BREAKFAST  . allopurinol (ZYLOPRIM) 300 MG tablet TAKE 1 TABLET BY MOUTH  DAILY  . amitriptyline (ELAVIL) 25 MG tablet TAKE 1 TABLET(25 MG) BY MOUTH AT BEDTIME  . aspirin 81 MG EC tablet Take 81 mg by mouth daily.  . Blood Glucose Monitoring Suppl (ACCU-CHEK AVIVA PLUS) w/Device KIT Use as directed  . cetirizine (ZYRTEC) 5 MG tablet Take 1 tablet (5 mg total) by mouth daily.  . Continuous Blood Gluc Sensor (DEXCOM G6 SENSOR) MISC Inject 1 Device into the skin as  directed. Apply to skin SQ every 10 days  . diclofenac Sodium (VOLTAREN) 1 % GEL Apply 2 g topically 4 (four) times daily.  . Dulaglutide (TRULICITY) 3 MPX/1.0GYSOPN Inject 3 mg as directed once a week.  . DULoxetine (CYMBALTA) 60 MG capsule TAKE 1 CAPSULE BY MOUTH  DAILY  . fluticasone (FLONASE) 50 MCG/ACT nasal spray Place 1 spray into the nose daily as needed for allergies.   . folic acid (FOLVITE) 1 MG tablet Take 2 mg by mouth 2 (two) times daily. 4 tabs daily  . glucose blood (ACCU-CHEK AVIVA PLUS) test strip USE TO TEST BLOOD SUGAR 6  TIMES PER DAY; E11.9  . Insulin Infusion Pump (T: SLIM X2 INS PMP/CONTROL 7.4) DEVI by Does not apply route.  . Insulin Infusion Pump Supplies (AUTOSOFT XC INFUSION SET) MISC Inject 1 Act into the skin every other day. Use with 955mcannula and 23 inch tubing. *5 boxes (90 day supply)  . Insulin Infusion Pump Supplies (T:SLIM INSULIN CARTRIDGE 3ML) MISC Use with insulin pump, fill once every 2 days.  . insulin lispro (HUMALOG) 100 UNIT/ML injection For use in pump, total of 120 units per day  . mometasone (ELOCON) 0.1 % lotion APPLY TOPICALLY DAILY  . Multiple Vitamins-Minerals (CENTRUM SILVER PO) Take 1 tablet by mouth daily.  . Omega-3 Fatty Acids (FISH OIL) 1200 MG CAPS Take 1,200 mg by mouth daily.  . Marland Kitchenmeprazole (PRILOSEC) 20 MG capsule Take  1 capsule (20 mg total) by mouth daily as needed.  Marland Kitchen PEG-KCl-NaCl-NaSulf-Na Asc-C (PLENVU) 140 g SOLR Take 140 g by mouth as directed. Manufacturer's coupon Universal coupon code:BIN: P2366821; GROUP: [id number]; PCN: [id number]; ID: [id number]; PAY NO MORE $50  . potassium chloride (KLOR-CON) 10 MEQ tablet TAKE 1 TABLET(10 MEQ) BY MOUTH DAILY  . rosuvastatin (CRESTOR) 40 MG tablet TAKE 1 TABLET BY MOUTH AT  BEDTIME  . vitamin B-12 (CYANOCOBALAMIN) 1000 MCG tablet Take 1,000 mcg by mouth daily.   No facility-administered encounter medications on file as of 01/04/2021.    Allergies (verified) Atorvastatin, Niacin, and  Pioglitazone   History: Past Medical History:  Diagnosis Date  . Allergy   . Anal fissure   . Anemia, unspecified   . Arthritis    Spinal OA  . BACK PAIN, LUMBAR 10/23/2007  . CARDIAC MURMUR, SYSTOLIC 12/29/7562  . DIABETES MELLITUS, TYPE I 05/20/2007  . DIASTOLIC DYSFUNCTION 3/32/9518  . 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 Duodenitis   . Edema 05/12/2008  . Esophageal stricture   . GERD (gastroesophageal reflux disease)   . 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 Gynecomastia   . Hepatic steatosis   . HYPERLIPIDEMIA 05/20/2007  . Hypertension   . Hypogonadism male   . Morbid obesity ([place]) 09/10/2009  . OBSTRUCTIVE SLEEP APNEA 11/04/2007  . Personal history of colonic polyps   . RENAL INSUFFICIENCY 05/12/2008  . Sleep apnea    uses cpap  . Squamous cell carcinoma of skin 02/16/2020   left lower leg, anterior-cx3,19f  . Urolithiasis    Past Surgical History:  Procedure Laterality Date  . Abdominal [place]orea 09/1997  . arm fracture Left 1958   with hardware  . Colon cancer screening    . COLONOSCOPY  08/18/2004   diverticulitis  . COLONOSCOPY  09/24/2009  . ELECTROCARDIOGRAM  02/2006  . FLEXIBLE SIGMOIDOSCOPY  03/04/2001   polyps, anal fissure  . GANGLION CYST EXCISION Left 1980  . Lower Arterial  04/13/2004  . POLYPECTOMY    . Rest Cardiolite  03/19/2003   Family History  Problem Relation Age of Onset  . Colon cancer Brother 594 . Colon polyps Brother   . Lung cancer Brother   . Other Mother        MVA, deceased 529s . Healthy Daughter   . Healthy Son   . Rectal cancer Neg Hx   . Stomach cancer Neg Hx    Social History   Socioeconomic History  . Marital status: Married    Spouse name: Not on file  . Number of children: Not on file  . Years of education: Not on file  . Highest education level: Not on file  Occupational History    Employer: [employer]  Tobacco Use  . Smoking status: Former Smoker    Packs/day: 2.00    Years: 31.00    Pack years: 62.00     Types: Cigarettes    Quit date: 10/23/1982    Years since quitting: 38.2  . Smokeless tobacco: Never Used  Vaping Use  . Vaping Use: Never used  Substance and Sexual Activity  . Alcohol use: No    Alcohol/week: 0.0 standard drinks  . Drug use: No  . Sexual activity: Not on file  Other Topics Concern  . Not on file  Social History Narrative   Lives with wife in a one story home.  Has a son and a daughter.     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     Education: 2 years of college.   Social Determinants of Health   Financial Resource Strain: Low Risk   . Difficulty of Paying Living Expenses: Not hard at all  Food Insecurity: No Food Insecurity  . Worried About Charity fundraiser in the Last Year: Never true  . Ran Out of Food in the Last Year: Never true  Transportation Needs: No Transportation Needs  . Lack of Transportation (Medical): No  . Lack of Transportation (Non-Medical): No  Physical Activity: Inactive  . Days of Exercise per Week: 0 days  . Minutes of Exercise per Session: 0 min  Stress: No Stress Concern Present  . Feeling of Stress : Not at all  Social Connections: Moderately Integrated  . Frequency of Communication with Friends and Family: More than three times a week  . Frequency of Social Gatherings with Friends and Family: More than three times a week  . Attends Religious Services: Never  . Active Member of Clubs or Organizations: Yes  . Attends Archivist Meetings: More than 4 times per year  . Marital Status: Married    Tobacco Counseling Counseling given: Not Answered   Clinical Intake:  Pre-visit preparation completed: Yes  Pain : No/denies pain     Nutritional Status: BMI > 30  Obese Nutritional Risks: None Diabetes: Yes CBG done?: No Did pt. bring in CBG monitor from home?: No  How often do you need to have someone help you when you read instructions, pamphlets, or other written materials from your doctor or  pharmacy?: 1 - Never  Diabetes:  Is the patient diabetic?  Yes  If diabetic, was a CBG obtained today?  No  Did the patient bring in their glucometer from home?  No  How often do you monitor your CBG's? Patient has a pump.   Financial Strains and Diabetes Management:  Are you having any financial strains with the device, your supplies or your medication? No .  Does the patient want to be seen by Chronic Care Management for management of their diabetes?  No  Would the patient like to be referred to a Nutritionist or for Diabetic Management?  No   Diabetic Exams:  Diabetic Eye Exam: Completed 08/20/2020.   Diabetic Foot Exam: Completed 11/17/2020. Interpreter Needed?: No  Information entered by :: Caroleen Hamman LPN   Activities of Daily Living In your present state of health, do you have any difficulty performing the following activities: 01/04/2021  Hearing? N  Vision? N  Difficulty concentrating or making decisions? Y  Comment occasionally  Walking or climbing stairs? N  Dressing or bathing? N  Doing errands, shopping? N  Preparing Food and eating ? N  Using the Toilet? N  In the past six months, have you accidently leaked urine? Y  Comment occasionally  Do you have problems with loss of bowel control? N  Managing your Medications? N  Managing your Finances? N  Housekeeping or managing your Housekeeping? N  Some recent data might be hidden    Patient Care Team: Libby Maw, MD as PCP - General (Family Medicine) Werner Lean, MD as PCP - Cardiology (Cardiology) Alda Berthold, DO as Consulting Physician (Neurology) Clance, Armando Reichert, MD as Consulting Physician (Pulmonary Disease) Asencion Islam (Ophthalmology) Warren Danes, PA-C as Physician Assistant (Dermatology)  Indicate any recent Medical Services you may have received from other than [facility] providers in the past year (date may  be approximate).     Assessment:   This is a  routine wellness examination for [place].  Hearing/Vision screen  Hearing Screening   '125Hz'  '250Hz'  '500Hz'  '1000Hz'  '2000Hz'  '3000Hz'  '4000Hz'  '6000Hz'  '8000Hz'   Right ear:           Left ear:           Comments: Patient c/o of some hearing loss  Vision Screening Comments: Reading glass Last eye exam-07/2020-[facility]   Dietary issues and exercise activities discussed: Current Exercise Habits: The patient does not participate in regular exercise at present, Exercise limited by: None identified  Goals    . Increase physical activity    . Patient Stated     Would like to lose some weight      Depression Screen PHQ 2/9 Scores 01/04/2021 10/21/2020 09/14/2020 12/31/2019 09/18/2018 09/20/2017 08/31/2016  PHQ - 2 Score 0 0 0 0 0 0 0  PHQ- 9 Score - - - - - - 1    Fall Risk Fall Risk  01/04/2021 10/21/2020 10/04/2020 09/14/2020 03/15/2020  Falls in the past year? 1 0 0 0 1  Comment - - - - -  Number falls in past yr: 1 - - 0 0  Comment - - - - -  Injury with Fall? 0 - 1 0 0  Comment - - - - -  Risk for fall due to : History of fall(s) - - - -  Risk for fall due to: Comment - - - - -  Follow up Falls prevention discussed - - - -    FALL RISK PREVENTION PERTAINING TO THE HOME:  Any stairs in or around the home? No  Home free of loose throw rugs in walkways, pet beds, electrical cords, etc? Yes  Adequate lighting in your home to reduce risk of falls? Yes   ASSISTIVE DEVICES UTILIZED TO PREVENT FALLS:  Life alert? No  Use of a cane, walker or w/c? No  Grab bars in the bathroom? No  Shower chair or bench in shower? No  Elevated toilet seat or a handicapped toilet? No   TIMED UP AND GO:  Was the test performed? Yes .  Length of time to ambulate 10 feet: 10 sec.   Gait slow and steady without use of assistive device  Cognitive Function:Normal cognitive status assessed by direct observation by this Nurse Health Advisor. No abnormalities found.       6CIT Screen 01/04/2021  What  Year? 0 points  What month? 0 points  What time? 0 points  Count back from 20 0 points  Months in reverse 2 points  Repeat phrase 0 points  Total Score 2    Immunizations Immunization History  Administered Date(s) Administered  . Fluad Quad(high Dose 65+) 07/10/2019  . Influenza Whole 07/23/2009, 06/23/2010  . Influenza, High Dose Seasonal PF 07/03/2017, 07/30/2018  . Influenza,inj,Quad PF,6+ Mos 09/26/2013, 07/07/2014, 10/01/2015, 06/30/2016  . PFIZER(Purple Top)SARS-COV-2 Vaccination 11/04/2019, 11/24/2019, 07/25/2020  . Pneumococcal Conjugate-13 11/25/2014  . Pneumococcal Polysaccharide-23 02/21/1995, 03/20/2013  . Td 02/20/1998, 10/27/2008  . Zoster 07/07/2014    TDAP status: Due, Education has been provided regarding the importance of this vaccine. Advised may receive this vaccine at local pharmacy or Health Dept. Aware to provide a copy of the vaccination record if obtained from local pharmacy or Health Dept. Verbalized acceptance and understanding.  Flu Vaccine status: Up to date per patient-date unknown  Pneumococcal vaccine status: Up to date  Covid-19 vaccine status: Completed vaccines  Qualifies for Shingles Vaccine? Yes   Zostavax completed Yes   Shingrix Completed?: No.    Education has been provided regarding the importance of this vaccine. Patient has been advised to call insurance company to determine out of pocket expense if they have not yet received this vaccine. Advised may also receive vaccine at local pharmacy or Health Dept. Verbalized acceptance and understanding.  Screening Tests Health Maintenance  Topic Date Due  . Hepatitis C Screening  Never done  . TETANUS/TDAP  10/27/2018  . COLONOSCOPY (Pts 45-28yr Insurance coverage will need to be confirmed)  05/04/2020  . INFLUENZA VACCINE  05/23/2020  . URINE MICROALBUMIN  07/09/2020  . COVID-19 Vaccine (4 - Booster for Pfizer series) 01/23/2021  . HEMOGLOBIN A1C  05/17/2021  . OPHTHALMOLOGY EXAM   08/20/2021  . FOOT EXAM  11/17/2021  . PNA vac Low Risk Adult  Completed  . HPV VACCINES  Aged Out    Health Maintenance  Health Maintenance Due  Topic Date Due  . Hepatitis C Screening  Never done  . TETANUS/TDAP  10/27/2018  . COLONOSCOPY (Pts 45-424yrInsurance coverage will need to be confirmed)  05/04/2020  . INFLUENZA VACCINE  05/23/2020  . URINE MICROALBUMIN  07/09/2020    Colorectal cancer screening: No longer required.   Lung Cancer Screening: (Low Dose CT Chest recommended if Age 80-80ears, 30 pack-year currently smoking OR have quit w/in 15years.) does not qualify.     Additional Screening:  Hepatitis C Screening: does not qualify   Vision Screening: Recommended annual ophthalmology exams for early detection of glaucoma and other disorders of the eye. Is the patient up to date with their annual eye exam?  Yes  Who is the provider or what is the name of the office in which the patient attends annual eye exams? Dr. [facility]arren [facility] Dental Screening: Recommended annual dental exams for proper oral hygiene  Community Resource Referral / Chronic Care Management: CRR required this visit?  No   CCM required this visit?  No      Plan:     I have personally reviewed and noted the following in the patient's chart:   . Medical and social history . Use of alcohol, tobacco or illicit drugs  . Current medications and supplements . Functional ability and status . Nutritional status . Physical activity . Advanced directives . List of other physicians . Hospitalizations, surgeries, and ER visits in previous 12 months . Vitals . Screenings to include cognitive, depression, and falls . Referrals and appointments  In addition, I have reviewed and discussed with patient certain preventive protocols, quality metrics, and best practice recommendations. A written personalized care plan for preventive services as well as general preventive health recommendations were  provided to patient.     MaMarta AntuLPN   12/22/59/0960Nurse Health Advisor  Nurse Notes: None

## 2021-01-04 ENCOUNTER — Ambulatory Visit (INDEPENDENT_AMBULATORY_CARE_PROVIDER_SITE_OTHER): Payer: Medicare Other

## 2021-01-04 ENCOUNTER — Other Ambulatory Visit: Payer: Self-pay

## 2021-01-04 ENCOUNTER — Other Ambulatory Visit: Payer: Self-pay | Admitting: Urology

## 2021-01-04 VITALS — BP 134/70 | HR 77 | Temp 97.3°F | Resp 16 | Ht 72.0 in | Wt 278.8 lb

## 2021-01-04 DIAGNOSIS — Z0000 Encounter for general adult medical examination without abnormal findings: Secondary | ICD-10-CM

## 2021-01-04 DIAGNOSIS — N281 Cyst of kidney, acquired: Secondary | ICD-10-CM

## 2021-01-04 NOTE — Patient Instructions (Signed)
[name] , Thank you for taking time to come for your Medicare Wellness Visit. I appreciate your ongoing commitment to your health goals. Please review the following plan we discussed and let me know if I can assist you in the future.   Screening recommendations/referrals: Colonoscopy: Please follow recommendations of your GI doctor. Recommended yearly ophthalmology/optometry visit for glaucoma screening and checkup Recommended yearly dental visit for hygiene and checkup  Vaccinations: Influenza vaccine: Per our conversation,up to date Pneumococcal vaccine: Completed vaccines Tdap vaccine: Discuss with pharmacy Shingles vaccine: Discuss with pharmacy  Covid-19: Completed vaccines  Advanced directives: Please bring a copy for your chart  Conditions/risks identified: See problem list  Next appointment: Follow up in one year for your annual wellness visit. 01/10/2022 @ 1:30  Preventive Care 80 Years and Older, Male Preventive care refers to lifestyle choices and visits with your health care provider that can promote health and wellness. What does preventive care include?  A yearly physical exam. This is also called an annual well check.  Dental exams once or twice a year.  Routine eye exams. Ask your health care provider how often you should have your eyes checked.  Personal lifestyle choices, including:  Daily care of your teeth and gums.  Regular physical activity.  Eating a healthy diet.  Avoiding tobacco and drug use.  Limiting alcohol use.  Practicing safe sex.  Taking low doses of aspirin every day.  Taking vitamin and mineral supplements as recommended by your health care provider. What happens during an annual well check? The services and screenings done by your health care provider during your annual well check will depend on your age, overall health, lifestyle risk factors, and family history of disease. Counseling  Your health care provider may ask you  questions about your:  Alcohol use.  Tobacco use.  Drug use.  Emotional well-being.  Home and relationship well-being.  Sexual activity.  Eating habits.  History of falls.  Memory and ability to understand (cognition).  Work and work Statistician. Screening  You may have the following tests or measurements:  Height, weight, and BMI.  Blood pressure.  Lipid and cholesterol levels. These may be checked every 5 years, or more frequently if you are over 55 years old.  Skin check.  Lung cancer screening. You may have this screening every year starting at age 42 if you have a 30-pack-year history of smoking and currently smoke or have quit within the past 15 years.  Fecal occult blood test (FOBT) of the stool. You may have this test every year starting at age 80.  Flexible sigmoidoscopy or colonoscopy. You may have a sigmoidoscopy every 5 years or a colonoscopy every 10 years starting at age 80.  Prostate cancer screening. Recommendations will vary depending on your family history and other risks.  Hepatitis C blood test.  Hepatitis B blood test.  Sexually transmitted disease (STD) testing.  Diabetes screening. This is done by checking your blood sugar (glucose) after you have not eaten for a while (fasting). You may have this done every 1-3 years.  Abdominal aortic aneurysm (AAA) screening. You may need this if you are a current or former smoker.  Osteoporosis. You may be screened starting at age 80 if you are at high risk. Talk with your health care provider about your test results, treatment options, and if necessary, the need for more tests. Vaccines  Your health care provider may recommend certain vaccines, such as:  Influenza vaccine. This is recommended every  year.  Tetanus, diphtheria, and acellular pertussis (Tdap, Td) vaccine. You may need a Td booster every 10 years.  Zoster vaccine. You may need this after age 80.  Pneumococcal 13-valent conjugate  (PCV13) vaccine. One dose is recommended after age 80.  Pneumococcal polysaccharide (PPSV23) vaccine. One dose is recommended after age 40. Talk to your health care provider about which screenings and vaccines you need and how often you need them. This information is not intended to replace advice given to you by your health care provider. Make sure you discuss any questions you have with your health care provider. Document Released: 11/05/2015 Document Revised: 06/28/2016 Document Reviewed: 08/10/2015 Elsevier Interactive Patient Education  2017 [place] Prevention in the Home Falls can cause injuries. They can happen to people of all ages. There are many things you can do to make your home safe and to help prevent falls. What can I do on the outside of my home?  Regularly fix the edges of walkways and driveways and fix any cracks.  Remove anything that might make you trip as you walk through a door, such as a raised step or threshold.  Trim any bushes or trees on the path to your home.  Use bright outdoor lighting.  Clear any walking paths of anything that might make someone trip, such as rocks or tools.  Regularly check to see if handrails are loose or broken. Make sure that both sides of any steps have handrails.  Any raised decks and porches should have guardrails on the edges.  Have any leaves, snow, or ice cleared regularly.  Use sand or salt on walking paths during winter.  Clean up any spills in your garage right away. This includes oil or grease spills. What can I do in the bathroom?  Use night lights.  Install grab bars by the toilet and in the tub and shower. Do not use towel bars as grab bars.  Use non-skid mats or decals in the tub or shower.  If you need to sit down in the shower, use a plastic, non-slip stool.  Keep the floor dry. Clean up any water that spills on the floor as soon as it happens.  Remove soap buildup in the tub or shower  regularly.  Attach bath mats securely with double-sided non-slip rug tape.  Do not have throw rugs and other things on the floor that can make you trip. What can I do in the bedroom?  Use night lights.  Make sure that you have a light by your bed that is easy to reach.  Do not use any sheets or blankets that are too big for your bed. They should not hang down onto the floor.  Have a firm chair that has side arms. You can use this for support while you get dressed.  Do not have throw rugs and other things on the floor that can make you trip. What can I do in the kitchen?  Clean up any spills right away.  Avoid walking on wet floors.  Keep items that you use a lot in easy-to-reach places.  If you need to reach something above you, use a strong step stool that has a grab bar.  Keep electrical cords out of the way.  Do not use floor polish or wax that makes floors slippery. If you must use wax, use non-skid floor wax.  Do not have throw rugs and other things on the floor that can make you trip. What can  I do with my stairs?  Do not leave any items on the stairs.  Make sure that there are handrails on both sides of the stairs and use them. Fix handrails that are broken or loose. Make sure that handrails are as long as the stairways.  Check any carpeting to make sure that it is firmly attached to the stairs. Fix any carpet that is loose or worn.  Avoid having throw rugs at the top or bottom of the stairs. If you do have throw rugs, attach them to the floor with carpet tape.  Make sure that you have a light switch at the top of the stairs and the bottom of the stairs. If you do not have them, ask someone to add them for you. What else can I do to help prevent falls?  Wear shoes that:  Do not have high heels.  Have rubber bottoms.  Are comfortable and fit you well.  Are closed at the toe. Do not wear sandals.  If you use a stepladder:  Make sure that it is fully  opened. Do not climb a closed stepladder.  Make sure that both sides of the stepladder are locked into place.  Ask someone to hold it for you, if possible.  Clearly mark and make sure that you can see:  Any grab bars or handrails.  First and last steps.  Where the edge of each step is.  Use tools that help you move around (mobility aids) if they are needed. These include:  Canes.  Walkers.  Scooters.  Crutches.  Turn on the lights when you go into a dark area. Replace any light bulbs as soon as they burn out.  Set up your furniture so you have a clear path. Avoid moving your furniture around.  If any of your floors are uneven, fix them.  If there are any pets around you, be aware of where they are.  Review your medicines with your doctor. Some medicines can make you feel dizzy. This can increase your chance of falling. Ask your doctor what other things that you can do to help prevent falls. This information is not intended to replace advice given to you by your health care provider. Make sure you discuss any questions you have with your health care provider. Document Released: 08/05/2009 Document Revised: 03/16/2016 Document Reviewed: 11/13/2014 Elsevier Interactive Patient Education  2017 [employer].

## 2021-01-06 ENCOUNTER — Ambulatory Visit: Payer: Medicare Other | Admitting: Family Medicine

## 2021-01-10 ENCOUNTER — Encounter: Payer: Self-pay | Admitting: Endocrinology

## 2021-01-10 ENCOUNTER — Other Ambulatory Visit: Payer: Self-pay

## 2021-01-10 ENCOUNTER — Ambulatory Visit (HOSPITAL_COMMUNITY)
Admission: RE | Admit: 2021-01-10 | Discharge: 2021-01-10 | Disposition: A | Discharge status: Home / Self Care | Payer: Medicare Other | Source: Ambulatory Visit | Attending: Urology | Admitting: Urology

## 2021-01-10 ENCOUNTER — Encounter: Payer: Self-pay | Admitting: Physician Assistant

## 2021-01-10 DIAGNOSIS — N281 Cyst of kidney, acquired: Secondary | ICD-10-CM | POA: Insufficient documentation

## 2021-01-10 NOTE — Progress Notes (Signed)
   Follow-Up Visit   Subjective  [name] is a 80 y.o. male who presents for the following: Follow-up (Follow up on a spot just above left ankle. No new concerns.).   The following portions of the chart were reviewed this encounter and updated as appropriate:  Tobacco  Allergies  Meds  Problems  Med Hx  Surg Hx  Fam Hx      Objective  Well appearing patient in no apparent distress; mood and affect are within normal limits.  A focused examination was performed including lower extremity. Relevant physical exam findings are noted in the Assessment and Plan.  Objective  Left Lower Leg - Anterior: Dyspigmented scar.    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  The documentation on 01/10/21 for the exam, diagnosis, procedures and orders are all accurate and complete.

## 2021-01-11 ENCOUNTER — Other Ambulatory Visit: Payer: Self-pay | Admitting: Endocrinology

## 2021-01-11 MED ORDER — INSULIN LISPRO-AABC 100 UNIT/ML IJ SOLN: INTRAMUSCULAR | 3 refills | Status: DC

## 2021-01-11 NOTE — Telephone Encounter (Signed)
[employer] called for patient and requested that we please contact patients pharmacy and clarify which insulin patient should be taking.  Pharmacy has two active insulin RX's on file for patient.

## 2021-01-17 ENCOUNTER — Ambulatory Visit: Payer: Medicare Other | Admitting: Physical Medicine & Rehabilitation

## 2021-02-18 ENCOUNTER — Ambulatory Visit: Payer: Medicare Other | Admitting: Endocrinology

## 2021-02-18 ENCOUNTER — Other Ambulatory Visit: Payer: Self-pay

## 2021-02-18 VITALS — BP 110/60 | HR 89 | Ht 72.0 in | Wt 270.2 lb

## 2021-02-18 DIAGNOSIS — E669 Obesity, unspecified: Secondary | ICD-10-CM

## 2021-02-18 DIAGNOSIS — E119 Type 2 diabetes mellitus without complications: Secondary | ICD-10-CM | POA: Diagnosis not present

## 2021-02-18 DIAGNOSIS — E0800 Diabetes mellitus due to underlying condition with hyperosmolarity without nonketotic hyperglycemic-hyperosmolar coma (NKHHC): Secondary | ICD-10-CM

## 2021-02-18 LAB — POCT GLYCOSYLATED HEMOGLOBIN (HGB A1C): Hemoglobin A1C: 6.9 % — AB (ref 4.0–5.6)

## 2021-02-18 MED ORDER — TRULICITY 4.5 MG/0.5ML ~~LOC~~ SOAJ: 4.5000 mg | SUBCUTANEOUS | 3 refills | Status: DC

## 2021-02-18 NOTE — Progress Notes (Signed)
Subjective:    Patient ID: [name], male    DOB: 03/20/1941, 80 y.o.   MRN: [id number]  HPI Pt returns for f/u of diabetes mellitus:  DM type: Insulin-requiring type 2.   Dx'ed: 8786 Complications: PN, CAD, and stage 4 CRI.   Therapy: insulin since 1995.  DKA: never.  Severe hypoglycemia: never.  Pancreatitis: never.  Other: he started pump therapy (now T-slim, since mid-2015), and dexcom G-6 continuous glucose monitor.   Interval history: he takes these pump settings:   basal rate of 1.7 units/hr, 24 hrs per day.   mealtime bolus of 1 unit/4 grams carbohydrate.  correction bolus (which some people call "sensitivity," or "insulin sensitivity ratio," or just "isr") of 1 unit for each 40 by which glucose exceeds 100.  "Control-IQ" is on 98% of the time TDD is approx 89 units (62% basal, 33% meal bolus, 1% correction bolus).   I reviewed continuous glucose monitor data.  Glucose varies from 80-300, but most are in the 100's. There is no trend throughout the day.    Past Medical History:  Diagnosis Date  . Allergy   . Anal fissure   . Anemia, unspecified   . Arthritis    Spinal OA  . BACK PAIN, LUMBAR 10/23/2007  . CARDIAC MURMUR, SYSTOLIC 04/27/7208  . DIABETES MELLITUS, TYPE I 05/20/2007  . DIASTOLIC DYSFUNCTION 4/70/9628  . 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 Duodenitis   . Edema 05/12/2008  . Esophageal stricture   . GERD (gastroesophageal reflux disease)   . 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 Gynecomastia   . Hepatic steatosis   . HYPERLIPIDEMIA 05/20/2007  . Hypertension   . Hypogonadism male   . Morbid obesity ([place]) 09/10/2009  . OBSTRUCTIVE SLEEP APNEA 11/04/2007  . Personal history of colonic polyps   . RENAL INSUFFICIENCY 05/12/2008  . Sleep apnea    uses cpap  . Squamous cell carcinoma of skin 02/16/2020   left lower leg, anterior-cx3,69f  . Urolithiasis     Past Surgical History:  Procedure Laterality Date  . Abdominal [place] 09/1997  . arm fracture Left  1958   with hardware  . Colon cancer screening    . COLONOSCOPY  08/18/2004   diverticulitis  . COLONOSCOPY  09/24/2009  . ELECTROCARDIOGRAM  02/2006  . FLEXIBLE SIGMOIDOSCOPY  03/04/2001   polyps, anal fissure  . GANGLION CYST EXCISION Left 1980  . Lower Arterial  04/13/2004  . POLYPECTOMY    . Rest Cardiolite  03/19/2003    Social History   Socioeconomic History  . Marital status: Married    Spouse name: Not on file  . Number of children: Not on file  . Years of education: Not on file  . Highest education level: Not on file  Occupational History    Employer: [employer]  Tobacco Use  . Smoking status: Former Smoker    Packs/day: 2.00    Years: 31.00    Pack years: 62.00    Types: Cigarettes    Quit date: 10/23/1982    Years since quitting: 38.3  . Smokeless tobacco: Never Used  Vaping Use  . Vaping Use: Never used  Substance and Sexual Activity  . Alcohol use: No    Alcohol/week: 0.0 standard drinks  . Drug use: No  . Sexual activity: Not on file  Other Topics Concern  . Not on file  Social History Narrative   Lives with wife in a one story home.  Has a  son and a daughter.     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     Education: 2 years of college.   Social Determinants of Health   Financial Resource Strain: Low Risk   . Difficulty of Paying Living Expenses: Not hard at all  Food Insecurity: No Food Insecurity  . Worried About Charity fundraiser in the Last Year: Never true  . Ran Out of Food in the Last Year: Never true  Transportation Needs: No Transportation Needs  . Lack of Transportation (Medical): No  . Lack of Transportation (Non-Medical): No  Physical Activity: Inactive  . Days of Exercise per Week: 0 days  . Minutes of Exercise per Session: 0 min  Stress: No Stress Concern Present  . Feeling of Stress : Not at all  Social Connections: Moderately Integrated  . Frequency of Communication with Friends and Family: More than  three times a week  . Frequency of Social Gatherings with Friends and Family: More than three times a week  . Attends Religious Services: Never  . Active Member of Clubs or Organizations: Yes  . Attends Archivist Meetings: More than 4 times per year  . Marital Status: Married  Human resources officer Violence: Not At Risk  . Fear of Current or Ex-Partner: No  . Emotionally Abused: No  . Physically Abused: No  . Sexually Abused: No    Current Outpatient Medications on File Prior to Visit  Medication Sig Dispense Refill  . Accu-Chek FastClix Lancets MISC USE TO CHECK BLOOD SUGAR UP TO 6 TIMES DAILY 612 each 3  . alfuzosin (UROXATRAL) 10 MG 24 hr tablet TAKE 1 TABLET BY MOUTH  DAILY WITH BREAKFAST 90 tablet 3  . allopurinol (ZYLOPRIM) 300 MG tablet TAKE 1 TABLET BY MOUTH  DAILY 90 tablet 3  . amitriptyline (ELAVIL) 25 MG tablet TAKE 1 TABLET(25 MG) BY MOUTH AT BEDTIME 30 tablet 1  . aspirin 81 MG EC tablet Take 81 mg by mouth daily.    . Blood Glucose Monitoring Suppl (ACCU-CHEK AVIVA PLUS) w/Device KIT Use as directed 1 kit 0  . cetirizine (ZYRTEC) 5 MG tablet Take 1 tablet (5 mg total) by mouth daily.    . Continuous Blood Gluc Sensor (DEXCOM G6 SENSOR) MISC Inject 1 Device into the skin as directed. Apply to skin SQ every 10 days 9 each 3  . diclofenac Sodium (VOLTAREN) 1 % GEL Apply 2 g topically 4 (four) times daily. 150 g 3  . DULoxetine (CYMBALTA) 60 MG capsule TAKE 1 CAPSULE BY MOUTH  DAILY 90 capsule 3  . fluticasone (FLONASE) 50 MCG/ACT nasal spray Place 1 spray into the nose daily as needed for allergies.     . folic acid (FOLVITE) 1 MG tablet Take 2 mg by mouth 2 (two) times daily. 4 tabs daily    . glucose blood (ACCU-CHEK AVIVA PLUS) test strip USE TO TEST BLOOD SUGAR 6  TIMES PER DAY; E11.9 600 each 11  . Insulin Infusion Pump (T: SLIM X2 INS PMP/CONTROL 7.4) DEVI by Does not apply route.    . Insulin Infusion Pump Supplies (AUTOSOFT XC INFUSION SET) MISC Inject 1 Act  into the skin every other day. Use with 91m cannula and 23 inch tubing. *5 boxes (90 day supply) 5 each 3  . Insulin Infusion Pump Supplies (T:SLIM INSULIN CARTRIDGE 3ML) MISC Use with insulin pump, fill once every 2 days. 5 each 3  . Insulin Lispro-aabc 100 UNIT/ML SOLN For use  in pump, total of 120 units each morning 120 mL 3  . mometasone (ELOCON) 0.1 % lotion APPLY TOPICALLY DAILY 60 mL 11  . Multiple Vitamins-Minerals (CENTRUM SILVER PO) Take 1 tablet by mouth daily.    . Omega-3 Fatty Acids (FISH OIL) 1200 MG CAPS Take 1,200 mg by mouth daily.    Marland Kitchen omeprazole (PRILOSEC) 20 MG capsule Take 1 capsule (20 mg total) by mouth daily as needed. 90 capsule 2  . PEG-KCl-NaCl-NaSulf-Na Asc-C (PLENVU) 140 g SOLR Take 140 g by mouth as directed. Manufacturer's coupon Universal coupon code:BIN: P2366821; GROUP: [id number]; PCN: [id number]; ID: [id number]; PAY NO MORE $50 1 each 0  . potassium chloride (KLOR-CON) 10 MEQ tablet TAKE 1 TABLET(10 MEQ) BY MOUTH DAILY 90 tablet 0  . rosuvastatin (CRESTOR) 40 MG tablet TAKE 1 TABLET BY MOUTH AT  BEDTIME 90 tablet 3  . vitamin B-12 (CYANOCOBALAMIN) 1000 MCG tablet Take 1,000 mcg by mouth daily.     No current facility-administered medications on file prior to visit.    Allergies  Allergen Reactions  . Atorvastatin Other (See Comments)    unknown  . Niacin     REACTION: Severe heartburn  . Pioglitazone     REACTION: Edema    Family History  Problem Relation Age of Onset  . Colon cancer Brother 53  . Colon polyps Brother   . Lung cancer Brother   . Other Mother        MVA, deceased 25s  . Healthy Daughter   . Healthy Son   . Rectal cancer Neg Hx   . Stomach cancer Neg Hx     BP 110/60 (BP Location: Right Arm, Patient Position: Sitting, Cuff Size: Large)   Pulse 89   Ht 6' (1.829 m)   Wt 270 lb 3.2 oz (122.6 kg)   SpO2 97%   BMI 36.65 kg/m    Review of Systems He has lost a few lbs.  Denies n/v.  He denies hypoglycemia    Objective:    Physical Exam VITAL SIGNS:  See vs page GENERAL: no distress Pulses: dorsalis pedis intact bilat.   MSK: no deformity of the feet CV: 1+ bilat leg edema Skin:  no ulcer on the feet.  normal color and temp on the feet.   Neuro: sensation is intact to touch on the feet, but decreased from normal.     Lab Results  Component Value Date   HGBA1C 6.9 (A) 02/18/2021       Assessment & Plan:  Insulin-requiring type 2 DM: well-controlled. Obesity: uncontrolled.  We'll favor GLP rx.    Patient Instructions  Please take these pump settings:  basal rate of 1.5 units/hr (when not in "control-IQ" mode).   mealtime bolus of 1 unit/4 grams carbohydrate.  continue correction bolus (which some people call "sensitivity," or "insulin sensitivity ratio," or just "isr") of 1 unit for each 40 by which your glucose exceeds 100.   I have sent a prescription to your pharmacy, to increase the Trulicity again.   Please come back for a follow-up appointment in 3 months.

## 2021-02-18 NOTE — Patient Instructions (Addendum)
Please take these pump settings:  basal rate of 1.5 units/hr (when not in "control-IQ" mode).   mealtime bolus of 1 unit/4 grams carbohydrate.  continue correction bolus (which some people call "sensitivity," or "insulin sensitivity ratio," or just "isr") of 1 unit for each 40 by which your glucose exceeds 100.   I have sent a prescription to your pharmacy, to increase the Trulicity again.   Please come back for a follow-up appointment in 3 months.

## 2021-02-21 ENCOUNTER — Other Ambulatory Visit: Payer: Self-pay

## 2021-02-21 ENCOUNTER — Encounter: Payer: Self-pay | Admitting: Family Medicine

## 2021-02-21 ENCOUNTER — Encounter: Payer: Medicare Other | Attending: Physical Medicine & Rehabilitation | Admitting: Physical Medicine & Rehabilitation

## 2021-02-21 ENCOUNTER — Encounter: Payer: Self-pay | Admitting: Physical Medicine & Rehabilitation

## 2021-02-21 ENCOUNTER — Ambulatory Visit: Payer: Medicare Other | Admitting: Family Medicine

## 2021-02-21 VITALS — BP 106/65 | HR 83 | Temp 98.7°F | Ht 72.0 in | Wt 275.0 lb

## 2021-02-21 VITALS — BP 122/66 | HR 81 | Temp 97.5°F | Ht 72.0 in | Wt 275.8 lb

## 2021-02-21 DIAGNOSIS — R269 Unspecified abnormalities of gait and mobility: Secondary | ICD-10-CM | POA: Insufficient documentation

## 2021-02-21 DIAGNOSIS — E538 Deficiency of other specified B group vitamins: Secondary | ICD-10-CM

## 2021-02-21 DIAGNOSIS — M792 Neuralgia and neuritis, unspecified: Secondary | ICD-10-CM | POA: Insufficient documentation

## 2021-02-21 DIAGNOSIS — E1142 Type 2 diabetes mellitus with diabetic polyneuropathy: Secondary | ICD-10-CM | POA: Insufficient documentation

## 2021-02-21 DIAGNOSIS — G8929 Other chronic pain: Secondary | ICD-10-CM | POA: Diagnosis present

## 2021-02-21 DIAGNOSIS — M545 Low back pain, unspecified: Secondary | ICD-10-CM | POA: Diagnosis present

## 2021-02-21 DIAGNOSIS — M791 Myalgia, unspecified site: Secondary | ICD-10-CM | POA: Diagnosis not present

## 2021-02-21 DIAGNOSIS — E7800 Pure hypercholesterolemia, unspecified: Secondary | ICD-10-CM

## 2021-02-21 DIAGNOSIS — E559 Vitamin D deficiency, unspecified: Secondary | ICD-10-CM

## 2021-02-21 DIAGNOSIS — N289 Disorder of kidney and ureter, unspecified: Secondary | ICD-10-CM | POA: Diagnosis not present

## 2021-02-21 DIAGNOSIS — Z8739 Personal history of other diseases of the musculoskeletal system and connective tissue: Secondary | ICD-10-CM

## 2021-02-21 NOTE — Progress Notes (Signed)
Subjective:    Patient ID: [name], male    DOB: 17-Sep-1941, 80 y.o.   MRN: [id number]   HPI  Male with pmh of OSA, CKD, HTN, DM type 1 with peripheral neuropathy presents for follow up of low back pain > neck pain.   On first visit, pt complained of b/l L>R back pain.  Started ~>20 years ago.  No inciting event.  Sitting/laying improve the pain.  Standing exacerbates the pain.  Sharp, burning pain.  Radiates to left posterior thigh.  Intermittent.  He has associated numbness/tingling.  He only tried ASA, which helps some.  Pain limits from doing ADLs and fishing.  He denies falls.  Last clinic visit on 10/21/2020.  He had trigger point injections at that time.  Since that time, patient states he had great benefit for about a week.  He uses a TENS with benefit. Patient states he feels he is getting weaker. He is taking Baclofen 1-2/week.  Sleep has improved. He is taking Elavil 25.  He states he believe he lost weight, but not exercising. Denies falls. Lightheadedness has improved.   Pain Inventory Average Pain 5 Pain Right Now 8 My pain is intermittent, sharp, burning, stabbing and tingling  In the last 24 hours, has pain interfered with the following? General activity 4 Relation with others 9 Enjoyment of life 9 What TIME of day is your pain at its worst? all Sleep (in general) Good  Pain is worse with: walking and standing Pain improves with: rest and TENS Relief from Meds: n/a'     Family History  Problem Relation Age of Onset  . Colon cancer Brother 18  . Colon polyps Brother   . Lung cancer Brother   . Other Mother        MVA, deceased 39s  . Healthy Daughter   . Healthy Son   . Rectal cancer Neg Hx   . Stomach cancer Neg Hx    Social History   Socioeconomic History  . Marital status: Married    Spouse name: Not on file  . Number of children: Not on file  . Years of education: Not on file  . Highest education level: Not on file  Occupational History     Employer: [employer]  Tobacco Use  . Smoking status: Former Smoker    Packs/day: 2.00    Years: 31.00    Pack years: 62.00    Types: Cigarettes    Quit date: 10/23/1982    Years since quitting: 38.3  . Smokeless tobacco: Never Used  Vaping Use  . Vaping Use: Never used  Substance and Sexual Activity  . Alcohol use: No    Alcohol/week: 0.0 standard drinks  . Drug use: No  . Sexual activity: Not on file  Other Topics Concern  . Not on file  Social History Narrative   Lives with wife in a one story home.  Has a son and a daughter.     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     Education: 2 years of college.   Social Determinants of Health   Financial Resource Strain: Low Risk   . Difficulty of Paying Living Expenses: Not hard at all  Food Insecurity: No Food Insecurity  . Worried About Charity fundraiser in the Last Year: Never true  . Ran Out of Food in the Last Year: Never true  Transportation Needs: No Transportation Needs  . Lack of Transportation (Medical): No  .  Lack of Transportation (Non-Medical): No  Physical Activity: Inactive  . Days of Exercise per Week: 0 days  . Minutes of Exercise per Session: 0 min  Stress: No Stress Concern Present  . Feeling of Stress : Not at all  Social Connections: Moderately Integrated  . Frequency of Communication with Friends and Family: More than three times a week  . Frequency of Social Gatherings with Friends and Family: More than three times a week  . Attends Religious Services: Never  . Active Member of Clubs or Organizations: Yes  . Attends Archivist Meetings: More than 4 times per year  . Marital Status: Married   Past Surgical History:  Procedure Laterality Date  . Abdominal US  09/1997  . arm fracture Left 1958   with hardware  . Colon cancer screening    . COLONOSCOPY  08/18/2004   diverticulitis  . COLONOSCOPY  09/24/2009  . ELECTROCARDIOGRAM  02/2006  . FLEXIBLE SIGMOIDOSCOPY   03/04/2001   polyps, anal fissure  . GANGLION CYST EXCISION Left 1980  . Lower Arterial  04/13/2004  . POLYPECTOMY    . Rest Cardiolite  03/19/2003   Past Medical History:  Diagnosis Date  . Allergy   . Anal fissure   . Anemia, unspecified   . Arthritis    Spinal OA  . BACK PAIN, LUMBAR 10/23/2007  . CARDIAC MURMUR, SYSTOLIC 10/28/1094  . DIABETES MELLITUS, TYPE I 05/20/2007  . DIASTOLIC DYSFUNCTION 0/45/4098  . 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[place])   . Duodenitis   . Edema 05/12/2008  . Esophageal stricture   . GERD (gastroesophageal reflux disease)   . 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 Gynecomastia   . Hepatic steatosis   . HYPERLIPIDEMIA 05/20/2007  . Hypertension   . Hypogonadism male   . Morbid obesity ([place]) 09/10/2009  . OBSTRUCTIVE SLEEP APNEA 11/04/2007  . Personal history of colonic polyps   . RENAL INSUFFICIENCY 05/12/2008  . Sleep apnea    uses cpap  . Squamous cell carcinoma of skin 02/16/2020   left lower leg, anterior-cx3,26fu  . Urolithiasis    There were no vitals taken for this visit.  Opioid Risk Score:   Fall Risk Score:  `1  Depression screen PHQ 2/9  Depression screen [facility] 2/9 01/04/2021 10/21/2020 09/14/2020 12/31/2019 09/18/2018 09/20/2017 08/31/2016  Decreased Interest 0 0 0 0 0 0 0  Down, Depressed, Hopeless 0 0 0 0 0 0 0  PHQ - 2 Score 0 0 0 0 0 0 0  Altered sleeping - - - - - - 0  Tired, decreased energy - - - - - - 1  Change in appetite - - - - - - 0  Feeling bad or failure about yourself  - - - - - - 0  Trouble concentrating - - - - - - 0  Moving slowly or fidgety/restless - - - - - - 0  Suicidal thoughts - - - - - - 0  PHQ-9 Score - - - - - - 1  Difficult doing work/chores - - - - - - Somewhat difficult  Some recent data might be hidden    Review of Systems  HENT: Negative.   Eyes: Negative.   Respiratory: Positive for apnea and shortness of breath.   Cardiovascular: Negative.   Gastrointestinal: Negative.   Endocrine:        Diabetic High blood sugar  Genitourinary: Negative.           Musculoskeletal: Positive for  arthralgias, back pain, gait problem, myalgias and neck pain.  Skin: Negative.   Allergic/Immunologic: Negative.   Neurological: Positive for weakness and numbness.       Tingling  Hematological: Negative.   All other systems reviewed and are negative.     Objective:   Physical Exam  Constitutional: No distress . Vital signs reviewed. HENT: Normocephalic.  Atraumatic. Eyes: EOMI. No discharge. Cardiovascular: No JVD.   Respiratory: Normal effort.  No stridor.   GI: Non-distended.   Skin: Warm and dry.  Intact. Psych: Normal mood.  Normal behavior. Musc: TTP lumbar spinous process Musc: Gait: Mildly antalgic Neuro: Alert  HOH             Strength          5/5 in all LE myotomes    Assessment & Plan:  Male with pmh of OSA, CKD, HTN, DM type 1 with peripheral neuropathy presents for follow up of low back pain >neck pain.   1. Chronic mechanical low back pain MRIs C,L-spine early 2016 suggesting multilevel spondylosis with mild b/l multilevel foraminal narrowing C4-C7 and shallow disc bulge at L5-S1 without central canal or foraminal stenosis.  Avoid NSAIDs due to CKD Unable to tolerate Gabapentin, Lyrica Cont HEP  Continue TENS Aquatic therapy, not going anymore, states ineffective Cont tylenol  Robaxin 500 TID PRN, changed to Baclofen 10TID PRN due to change insurance, continue   Continue Cymbalta 60mg  Contback brace during periods of excessive activity, reminded to avoid prolonged use  Continue Lidoderm OTC, reminded  Encouraged rest breaks             Acknowledges he can do more if he make a conscious effort PT completed             Continue Voltaren gel   Pain over spinous process where clothing is resulting increased pressure.  Encourage change in clothing.   Will  schedule for PNS  2. Sacroiliitis See #1 Cont brace Not main pain generator at present, does not wish to proceed with injection at this time, particularly due to DM  3. Sleep disturbance             Recently new mattress             Cont CPAP             Improved  4. Myalgia  Will schedule for trigger point injections  5. Diabetic neuropathy See #1, #3             Side effects with elavil to 50mg  qhs, continue 25mg  educated on signs/symptoms of seratonin syndrome previously  6. Morbid obesity Not interested in seeing dietitian at this time  Encouraged activity   No attempts at weight loss  7. Gait abnormality Completed therapies, cont HEP  Continue cane for safety  8. Nonhealing ulcer             Left lateral foot, improving  9. Lightheadedness  Encouraged BP monitoring at home

## 2021-02-21 NOTE — Progress Notes (Signed)
Established Patient Office Visit  Subjective:  Patient ID: [name], male    DOB: [birth year]  Age: 80 y.o. MRN: [id number]  CC:  Chief Complaint  Patient presents with  . Follow-up    Routine follow up, no concerns. Patient not fasting.     HPI [name] presents for follow-up for elevated cholesterol, vitamin D and vitamin B12 deficiency, renal insufficiency and gout.  Patient has had no further syncopal spells since his blood pressure medicines were discontinued.  Blood pressure has been normal.  Just saw urology for BPH they are continuing Uroxatrol.  Continues follow-up with endocrinology and physical medicine.  Past Medical History:  Diagnosis Date  . Allergy   . Anal fissure   . Anemia, unspecified   . Arthritis    Spinal OA  . BACK PAIN, LUMBAR 10/23/2007  . CARDIAC MURMUR, SYSTOLIC 04/24/5669  . DIABETES MELLITUS, TYPE I 05/20/2007  . DIASTOLIC DYSFUNCTION 1/41/0301  . 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[place])   . Duodenitis   . Edema 05/12/2008  . Esophageal stricture   . GERD (gastroesophageal reflux disease)   . 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 Gynecomastia   . Hepatic steatosis   . HYPERLIPIDEMIA 05/20/2007  . Hypertension   . Hypogonadism male   . Morbid obesity ([place]) 09/10/2009  . OBSTRUCTIVE SLEEP APNEA 11/04/2007  . Personal history of colonic polyps   . RENAL INSUFFICIENCY 05/12/2008  . Sleep apnea    uses cpap  . Squamous cell carcinoma of skin 02/16/2020   left lower leg, anterior-cx3,75f  . Urolithiasis     Past Surgical History:  Procedure Laterality Date  . Abdominal [place] 09/1997  . arm fracture Left 1958   with hardware  . Colon cancer screening    . COLONOSCOPY  08/18/2004   diverticulitis  . COLONOSCOPY  09/24/2009  . ELECTROCARDIOGRAM  02/2006  . FLEXIBLE SIGMOIDOSCOPY  03/04/2001   polyps, anal fissure  . GANGLION CYST EXCISION Left 1980  . Lower Arterial  04/13/2004  . POLYPECTOMY    . Rest Cardiolite  03/19/2003    Family History   Problem Relation Age of Onset  . Colon cancer Brother 59 . Colon polyps Brother   . Lung cancer Brother   . Other Mother        MVA, deceased 535s . Healthy Daughter   . Healthy Son   . Rectal cancer Neg Hx   . Stomach cancer Neg Hx     Social History   Socioeconomic History  . Marital status: Married    Spouse name: Not on file  . Number of children: Not on file  . Years of education: Not on file  . Highest education level: Not on file  Occupational History    Employer: [employer]  Tobacco Use  . Smoking status: Former Smoker    Packs/day: 2.00    Years: 31.00    Pack years: 62.00    Types: Cigarettes    Quit date: 10/23/1982    Years since quitting: 38.3  . Smokeless tobacco: Never Used  Vaping Use  . Vaping Use: Never used  Substance and Sexual Activity  . Alcohol use: No    Alcohol/week: 0.0 standard drinks  . Drug use: No  . Sexual activity: Not on file  Other Topics Concern  . Not on file  Social History Narrative   Lives with wife in a one story home.  Has a son and a daughter.  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     Education: 2 years of college.   Social Determinants of Health   Financial Resource Strain: Low Risk   . Difficulty of Paying Living Expenses: Not hard at all  Food Insecurity: No Food Insecurity  . Worried About Charity fundraiser in the Last Year: Never true  . Ran Out of Food in the Last Year: Never true  Transportation Needs: No Transportation Needs  . Lack of Transportation (Medical): No  . Lack of Transportation (Non-Medical): No  Physical Activity: Inactive  . Days of Exercise per Week: 0 days  . Minutes of Exercise per Session: 0 min  Stress: No Stress Concern Present  . Feeling of Stress : Not at all  Social Connections: Moderately Integrated  . Frequency of Communication with Friends and Family: More than three times a week  . Frequency of Social Gatherings with Friends and Family: More than  three times a week  . Attends Religious Services: Never  . Active Member of Clubs or Organizations: Yes  . Attends Archivist Meetings: More than 4 times per year  . Marital Status: Married  Human resources officer Violence: Not At Risk  . Fear of Current or Ex-Partner: No  . Emotionally Abused: No  . Physically Abused: No  . Sexually Abused: No    Outpatient Medications Prior to Visit  Medication Sig Dispense Refill  . Accu-Chek FastClix Lancets MISC USE TO CHECK BLOOD SUGAR UP TO 6 TIMES DAILY 612 each 3  . alfuzosin (UROXATRAL) 10 MG 24 hr tablet TAKE 1 TABLET BY MOUTH  DAILY WITH BREAKFAST 90 tablet 3  . allopurinol (ZYLOPRIM) 300 MG tablet TAKE 1 TABLET BY MOUTH  DAILY 90 tablet 3  . amitriptyline (ELAVIL) 25 MG tablet TAKE 1 TABLET(25 MG) BY MOUTH AT BEDTIME 30 tablet 1  . aspirin 81 MG EC tablet Take 81 mg by mouth daily.    . Blood Glucose Monitoring Suppl (ACCU-CHEK AVIVA PLUS) w/Device KIT Use as directed 1 kit 0  . cetirizine (ZYRTEC) 5 MG tablet Take 1 tablet (5 mg total) by mouth daily.    . Continuous Blood Gluc Sensor (DEXCOM G6 SENSOR) MISC Inject 1 Device into the skin as directed. Apply to skin SQ every 10 days 9 each 3  . diclofenac Sodium (VOLTAREN) 1 % GEL Apply 2 g topically 4 (four) times daily. 150 g 3  . Dulaglutide (TRULICITY) 4.5 MH/9.6QI SOPN Inject 4.5 mg as directed once a week. 6 mL 3  . DULoxetine (CYMBALTA) 60 MG capsule TAKE 1 CAPSULE BY MOUTH  DAILY 90 capsule 3  . fluticasone (FLONASE) 50 MCG/ACT nasal spray Place 1 spray into the nose daily as needed for allergies.     . folic acid (FOLVITE) 1 MG tablet Take 2 mg by mouth 2 (two) times daily. 4 tabs daily    . glucose blood (ACCU-CHEK AVIVA PLUS) test strip USE TO TEST BLOOD SUGAR 6  TIMES PER DAY; E11.9 600 each 11  . Insulin Infusion Pump (T: SLIM X2 INS PMP/CONTROL 7.4) DEVI by Does not apply route.    . Insulin Infusion Pump Supplies (AUTOSOFT XC INFUSION SET) MISC Inject 1 Act into the skin  every other day. Use with 12m cannula and 23 inch tubing. *5 boxes (90 day supply) 5 each 3  . Insulin Infusion Pump Supplies (T:SLIM INSULIN CARTRIDGE 3ML) MISC Use with insulin pump, fill once every 2 days. 5 each 3  .  Insulin Lispro-aabc 100 UNIT/ML SOLN For use in pump, total of 120 units each morning 120 mL 3  . mometasone (ELOCON) 0.1 % lotion APPLY TOPICALLY DAILY 60 mL 11  . Multiple Vitamins-Minerals (CENTRUM SILVER PO) Take 1 tablet by mouth daily.    . Omega-3 Fatty Acids (FISH OIL) 1200 MG CAPS Take 1,200 mg by mouth daily.    Marland Kitchen omeprazole (PRILOSEC) 20 MG capsule Take 1 capsule (20 mg total) by mouth daily as needed. 90 capsule 2  . rosuvastatin (CRESTOR) 40 MG tablet TAKE 1 TABLET BY MOUTH AT  BEDTIME 90 tablet 3  . vitamin B-12 (CYANOCOBALAMIN) 1000 MCG tablet Take 1,000 mcg by mouth daily.    Marland Kitchen PEG-KCl-NaCl-NaSulf-Na Asc-C (PLENVU) 140 g SOLR Take 140 g by mouth as directed. Manufacturer's coupon Universal coupon code:BIN: P2366821; GROUP: [id number]; PCN: [id number]; ID: [id number]; PAY NO MORE $50 (Patient not taking: Reported on 02/21/2021) 1 each 0  . potassium chloride (KLOR-CON) 10 MEQ tablet TAKE 1 TABLET(10 MEQ) BY MOUTH DAILY (Patient not taking: Reported on 02/21/2021) 90 tablet 0   No facility-administered medications prior to visit.    Allergies  Allergen Reactions  . Atorvastatin Other (See Comments)    unknown  . Niacin     REACTION: Severe heartburn  . Pioglitazone     REACTION: Edema    ROS Review of Systems  Constitutional: Negative for diaphoresis, fatigue, fever and unexpected weight change.  HENT: Negative.   Eyes: Negative for photophobia and visual disturbance.  Respiratory: Negative.   Cardiovascular: Negative.   Gastrointestinal: Negative.   Endocrine: Negative for polyphagia and polyuria.  Genitourinary: Negative for difficulty urinating, frequency and urgency.  Musculoskeletal: Positive for arthralgias and back pain.  Neurological: Negative for  dizziness and light-headedness.  Psychiatric/Behavioral: Negative.       Objective:    Physical Exam Vitals and nursing note reviewed.  Constitutional:      General: He is not in acute distress.    Appearance: Normal appearance. He is not ill-appearing, toxic-appearing or diaphoretic.  HENT:     Head: Normocephalic and atraumatic.     Right Ear: Tympanic membrane, ear canal and external ear normal.     Left Ear: Tympanic membrane, ear canal and external ear normal.     Mouth/Throat:     Mouth: Mucous membranes are moist.     Pharynx: Oropharynx is clear. No oropharyngeal exudate or posterior oropharyngeal erythema.  Eyes:     General: No scleral icterus.       Right eye: No discharge.        Left eye: No discharge.     Conjunctiva/sclera: Conjunctivae normal.     Pupils: Pupils are equal, round, and reactive to light.  Cardiovascular:     Rate and Rhythm: Normal rate and regular rhythm.  Pulmonary:     Effort: Pulmonary effort is normal.     Breath sounds: Normal breath sounds.  Musculoskeletal:     Cervical back: No rigidity or tenderness.  Lymphadenopathy:     Cervical: No cervical adenopathy.  Skin:    General: Skin is warm and dry.  Neurological:     Mental Status: He is alert and oriented to person, place, and time.  Psychiatric:        Mood and Affect: Mood normal.        Behavior: Behavior normal.     BP 122/66   Pulse 81   Temp (!) 97.5 F (36.4 C) (Temporal)   Ht 6' (1.829 m)  Wt 275 lb 12.8 oz (125.1 kg)   SpO2 93%   BMI 37.41 kg/m  Wt Readings from Last 3 Encounters:  02/21/21 275 lb 12.8 oz (125.1 kg)  02/21/21 275 lb (124.7 kg)  02/18/21 270 lb 3.2 oz (122.6 kg)     Health Maintenance Due  Topic Date Due  . Hepatitis C Screening  Never done  . TETANUS/TDAP  10/27/2018  . COLONOSCOPY (Pts 45-75yr Insurance coverage will need to be confirmed)  05/04/2020  . URINE MICROALBUMIN  07/09/2020    There are no preventive care reminders to  display for this patient.  Lab Results  Component Value Date   TSH 2.98 09/21/2020   Lab Results  Component Value Date   WBC 6.9 09/21/2020   HGB 15.8 09/21/2020   HCT 46.7 09/21/2020   MCV 90.9 09/21/2020   PLT 149.0 (L) 09/21/2020   Lab Results  Component Value Date   NA 146 (H) 11/17/2020   K 4.2 11/17/2020   CO2 26 11/17/2020   GLUCOSE 133 (H) 11/17/2020   BUN 15 11/17/2020   CREATININE 1.62 (H) 11/17/2020   BILITOT 0.6 10/09/2019   ALKPHOS 85 10/09/2019   AST 15 10/09/2019   ALT 18 10/09/2019   PROT 6.4 10/09/2019   ALBUMIN 4.1 10/09/2019   CALCIUM 9.3 11/17/2020   ANIONGAP 8 11/28/2014   GFR 34.13 (L) 09/28/2020   Lab Results  Component Value Date   CHOL 116 07/10/2019   Lab Results  Component Value Date   HDL 32.20 (L) 07/10/2019   Lab Results  Component Value Date   LDLCALC 57 07/10/2019   Lab Results  Component Value Date   TRIG 137.0 07/10/2019   Lab Results  Component Value Date   CHOLHDL 4 07/10/2019   Lab Results  Component Value Date   HGBA1C 6.9 (A) 02/18/2021      Assessment & Plan:   Problem List Items Addressed This Visit      Genitourinary   Renal insufficiency - Primary   Relevant Orders   CBC   Comprehensive metabolic panel   Urinalysis, Routine w reflex microscopic     Other   Vitamin D deficiency   Relevant Orders   VITAMIN D 25 Hydroxy (Vit-D Deficiency, Fractures)   Elevated LDL cholesterol level   Relevant Orders   Lipid panel   B12 deficiency   Relevant Orders   Vitamin B12   History of gout   Relevant Orders   Uric acid      No orders of the defined types were placed in this encounter.   Follow-up: Return in about 6 months (around 08/24/2021), or Return fasting for blood work..   Will return fasting for blood work next week. WLibby Maw MD

## 2021-02-24 ENCOUNTER — Ambulatory Visit: Payer: Medicare Other | Admitting: Physical Medicine & Rehabilitation

## 2021-02-28 ENCOUNTER — Other Ambulatory Visit: Payer: Self-pay

## 2021-02-28 ENCOUNTER — Other Ambulatory Visit (INDEPENDENT_AMBULATORY_CARE_PROVIDER_SITE_OTHER): Payer: Medicare Other

## 2021-02-28 DIAGNOSIS — N289 Disorder of kidney and ureter, unspecified: Secondary | ICD-10-CM | POA: Diagnosis not present

## 2021-02-28 DIAGNOSIS — Z8739 Personal history of other diseases of the musculoskeletal system and connective tissue: Secondary | ICD-10-CM

## 2021-02-28 DIAGNOSIS — E559 Vitamin D deficiency, unspecified: Secondary | ICD-10-CM | POA: Diagnosis not present

## 2021-02-28 DIAGNOSIS — E538 Deficiency of other specified B group vitamins: Secondary | ICD-10-CM | POA: Diagnosis not present

## 2021-02-28 DIAGNOSIS — E7800 Pure hypercholesterolemia, unspecified: Secondary | ICD-10-CM | POA: Diagnosis not present

## 2021-02-28 LAB — URINALYSIS, ROUTINE W REFLEX MICROSCOPIC
Bilirubin Urine: NEGATIVE
Hgb urine dipstick: NEGATIVE
Ketones, ur: NEGATIVE
Nitrite: NEGATIVE
RBC / HPF: NONE SEEN (ref 0–?)
Specific Gravity, Urine: 1.02 (ref 1.000–1.030)
Urine Glucose: NEGATIVE
Urobilinogen, UA: 2 — AB (ref 0.0–1.0)
pH: 6.5 (ref 5.0–8.0)

## 2021-02-28 LAB — COMPREHENSIVE METABOLIC PANEL
ALT: 20 U/L (ref 0–53)
AST: 18 U/L (ref 0–37)
Albumin: 4 g/dL (ref 3.5–5.2)
Alkaline Phosphatase: 82 U/L (ref 39–117)
BUN: 23 mg/dL (ref 6–23)
CO2: 29 mEq/L (ref 19–32)
Calcium: 9.2 mg/dL (ref 8.4–10.5)
Chloride: 103 mEq/L (ref 96–112)
Creatinine, Ser: 1.45 mg/dL (ref 0.40–1.50)
GFR: 45.88 mL/min — ABNORMAL LOW (ref >60.00)
Glucose, Bld: 171 mg/dL — ABNORMAL HIGH (ref 70–99)
Potassium: 4.2 mEq/L (ref 3.5–5.1)
Sodium: 142 mEq/L (ref 135–145)
Total Bilirubin: 0.7 mg/dL (ref 0.2–1.2)
Total Protein: 6.6 g/dL (ref 6.0–8.3)

## 2021-02-28 LAB — LIPID PANEL
Cholesterol: 135 mg/dL (ref 0–200)
HDL: 33.6 mg/dL — ABNORMAL LOW (ref >39.00)
LDL Cholesterol: 66 mg/dL (ref 0–99)
NonHDL: 101.14
Total CHOL/HDL Ratio: 4
Triglycerides: 174 mg/dL — ABNORMAL HIGH (ref 0.0–149.0)
VLDL: 34.8 mg/dL (ref 0.0–40.0)

## 2021-02-28 LAB — CBC
HCT: 43.4 % (ref 39.0–52.0)
Hemoglobin: 14.5 g/dL (ref 13.0–17.0)
MCHC: 33.3 g/dL (ref 30.0–36.0)
MCV: 91.5 fl (ref 78.0–100.0)
Platelets: 137 10*3/uL — ABNORMAL LOW (ref 150.0–400.0)
RBC: 4.75 Mil/uL (ref 4.22–5.81)
RDW: 15.2 % (ref 11.5–15.5)
WBC: 5.4 10*3/uL (ref 4.0–10.5)

## 2021-02-28 LAB — VITAMIN D 25 HYDROXY (VIT D DEFICIENCY, FRACTURES): VITD: 31.63 ng/mL (ref 30.00–100.00)

## 2021-02-28 LAB — VITAMIN B12: Vitamin B-12: 459 pg/mL (ref 211–911)

## 2021-02-28 LAB — URIC ACID: Uric Acid, Serum: 3.8 mg/dL — ABNORMAL LOW (ref 4.0–7.8)

## 2021-02-28 NOTE — Progress Notes (Signed)
Per orders of Dr. Ethelene Hal pt is here for labs pt tolerated draw well.

## 2021-03-23 ENCOUNTER — Encounter: Payer: Self-pay | Admitting: Endocrinology

## 2021-03-26 ENCOUNTER — Other Ambulatory Visit: Payer: Self-pay

## 2021-03-28 ENCOUNTER — Telehealth: Payer: Self-pay | Admitting: Endocrinology

## 2021-03-28 NOTE — Telephone Encounter (Signed)
Pt calling in to F/u on this last message.

## 2021-04-14 ENCOUNTER — Other Ambulatory Visit: Payer: Self-pay | Admitting: Family Medicine

## 2021-04-20 ENCOUNTER — Other Ambulatory Visit (HOSPITAL_COMMUNITY): Payer: Medicare Other

## 2021-04-23 ENCOUNTER — Emergency Department (HOSPITAL_COMMUNITY): Payer: Medicare Other

## 2021-04-23 ENCOUNTER — Encounter (HOSPITAL_COMMUNITY): Payer: Self-pay | Admitting: Family Medicine

## 2021-04-23 ENCOUNTER — Other Ambulatory Visit: Payer: Self-pay

## 2021-04-23 ENCOUNTER — Inpatient Hospital Stay (HOSPITAL_COMMUNITY)
Admission: EM | Admit: 2021-04-23 | Discharge: 2021-04-28 | DRG: 481 | Disposition: A | Discharge status: Skilled Nursing Facility | Payer: Medicare Other | Attending: Internal Medicine | Admitting: Internal Medicine

## 2021-04-23 DIAGNOSIS — S72009A Fracture of unspecified part of neck of unspecified femur, initial encounter for closed fracture: Secondary | ICD-10-CM

## 2021-04-23 DIAGNOSIS — Z801 Family history of malignant neoplasm of trachea, bronchus and lung: Secondary | ICD-10-CM | POA: Diagnosis not present

## 2021-04-23 DIAGNOSIS — K529 Noninfective gastroenteritis and colitis, unspecified: Secondary | ICD-10-CM | POA: Diagnosis present

## 2021-04-23 DIAGNOSIS — Z800 Family history of malignant neoplasm of digestive organs: Secondary | ICD-10-CM

## 2021-04-23 DIAGNOSIS — Z7982 Long term (current) use of aspirin: Secondary | ICD-10-CM | POA: Diagnosis not present

## 2021-04-23 DIAGNOSIS — W010XXA Fall on same level from slipping, tripping and stumbling without subsequent striking against object, initial encounter: Secondary | ICD-10-CM | POA: Diagnosis present

## 2021-04-23 DIAGNOSIS — Z9641 Presence of insulin pump (external) (internal): Secondary | ICD-10-CM | POA: Diagnosis present

## 2021-04-23 DIAGNOSIS — S72001A Fracture of unspecified part of neck of right femur, initial encounter for closed fracture: Principal | ICD-10-CM | POA: Diagnosis present

## 2021-04-23 DIAGNOSIS — G4733 Obstructive sleep apnea (adult) (pediatric): Secondary | ICD-10-CM | POA: Diagnosis present

## 2021-04-23 DIAGNOSIS — D179 Benign lipomatous neoplasm, unspecified: Secondary | ICD-10-CM | POA: Diagnosis present

## 2021-04-23 DIAGNOSIS — M80059D Age-related osteoporosis with current pathological fracture, unspecified femur, subsequent encounter for fracture with routine healing: Secondary | ICD-10-CM

## 2021-04-23 DIAGNOSIS — R5381 Other malaise: Secondary | ICD-10-CM | POA: Diagnosis present

## 2021-04-23 DIAGNOSIS — N179 Acute kidney failure, unspecified: Secondary | ICD-10-CM

## 2021-04-23 DIAGNOSIS — F05 Delirium due to known physiological condition: Secondary | ICD-10-CM | POA: Diagnosis not present

## 2021-04-23 DIAGNOSIS — E876 Hypokalemia: Secondary | ICD-10-CM | POA: Diagnosis present

## 2021-04-23 DIAGNOSIS — Y92009 Unspecified place in unspecified non-institutional (private) residence as the place of occurrence of the external cause: Secondary | ICD-10-CM

## 2021-04-23 DIAGNOSIS — Z888 Allergy status to other drugs, medicaments and biological substances status: Secondary | ICD-10-CM

## 2021-04-23 DIAGNOSIS — M6282 Rhabdomyolysis: Secondary | ICD-10-CM | POA: Diagnosis not present

## 2021-04-23 DIAGNOSIS — Z87891 Personal history of nicotine dependence: Secondary | ICD-10-CM | POA: Diagnosis not present

## 2021-04-23 DIAGNOSIS — S72001D Fracture of unspecified part of neck of right femur, subsequent encounter for closed fracture with routine healing: Secondary | ICD-10-CM | POA: Diagnosis not present

## 2021-04-23 DIAGNOSIS — E669 Obesity, unspecified: Secondary | ICD-10-CM | POA: Diagnosis not present

## 2021-04-23 DIAGNOSIS — Z6837 Body mass index (BMI) 37.0-37.9, adult: Secondary | ICD-10-CM | POA: Diagnosis not present

## 2021-04-23 DIAGNOSIS — Z8371 Family history of colonic polyps: Secondary | ICD-10-CM | POA: Diagnosis not present

## 2021-04-23 DIAGNOSIS — E1122 Type 2 diabetes mellitus with diabetic chronic kidney disease: Secondary | ICD-10-CM | POA: Diagnosis not present

## 2021-04-23 DIAGNOSIS — Z794 Long term (current) use of insulin: Secondary | ICD-10-CM | POA: Diagnosis not present

## 2021-04-23 DIAGNOSIS — N1832 Chronic kidney disease, stage 3b: Secondary | ICD-10-CM | POA: Diagnosis not present

## 2021-04-23 DIAGNOSIS — I129 Hypertensive chronic kidney disease with stage 1 through stage 4 chronic kidney disease, or unspecified chronic kidney disease: Secondary | ICD-10-CM | POA: Diagnosis present

## 2021-04-23 DIAGNOSIS — Z79899 Other long term (current) drug therapy: Secondary | ICD-10-CM

## 2021-04-23 DIAGNOSIS — Z20822 Contact with and (suspected) exposure to covid-19: Secondary | ICD-10-CM | POA: Diagnosis not present

## 2021-04-23 DIAGNOSIS — E538 Deficiency of other specified B group vitamins: Secondary | ICD-10-CM | POA: Diagnosis present

## 2021-04-23 DIAGNOSIS — E785 Hyperlipidemia, unspecified: Secondary | ICD-10-CM | POA: Diagnosis present

## 2021-04-23 DIAGNOSIS — N189 Chronic kidney disease, unspecified: Secondary | ICD-10-CM

## 2021-04-23 DIAGNOSIS — I10 Essential (primary) hypertension: Secondary | ICD-10-CM | POA: Diagnosis present

## 2021-04-23 DIAGNOSIS — E119 Type 2 diabetes mellitus without complications: Secondary | ICD-10-CM

## 2021-04-23 DIAGNOSIS — R748 Abnormal levels of other serum enzymes: Secondary | ICD-10-CM

## 2021-04-23 LAB — URINALYSIS, ROUTINE W REFLEX MICROSCOPIC
Bacteria, UA: NONE SEEN
Bilirubin Urine: NEGATIVE
Glucose, UA: NEGATIVE mg/dL
Hgb urine dipstick: NEGATIVE
Ketones, ur: NEGATIVE mg/dL
Nitrite: NEGATIVE
Protein, ur: 30 mg/dL — AB
Specific Gravity, Urine: 1.018 (ref 1.005–1.030)
pH: 5 (ref 5.0–8.0)

## 2021-04-23 LAB — CBC WITH DIFFERENTIAL/PLATELET
Abs Immature Granulocytes: 0.07 10*3/uL (ref 0.00–0.07)
Basophils Absolute: 0 10*3/uL (ref 0.0–0.1)
Basophils Relative: 0 %
Eosinophils Absolute: 0 10*3/uL (ref 0.0–0.5)
Eosinophils Relative: 0 %
HCT: 40.3 % (ref 39.0–52.0)
Hemoglobin: 13.7 g/dL (ref 13.0–17.0)
Immature Granulocytes: 1 %
Lymphocytes Relative: 5 %
Lymphs Abs: 0.5 10*3/uL — ABNORMAL LOW (ref 0.7–4.0)
MCH: 30.5 pg (ref 26.0–34.0)
MCHC: 34 g/dL (ref 30.0–36.0)
MCV: 89.8 fL (ref 80.0–100.0)
Monocytes Absolute: 0.5 10*3/uL (ref 0.1–1.0)
Monocytes Relative: 5 %
Neutro Abs: 8.9 10*3/uL — ABNORMAL HIGH (ref 1.7–7.7)
Neutrophils Relative %: 89 %
Platelets: 101 10*3/uL — ABNORMAL LOW (ref 150–400)
RBC: 4.49 MIL/uL (ref 4.22–5.81)
RDW: 14.6 % (ref 11.5–15.5)
WBC: 10 10*3/uL (ref 4.0–10.5)
nRBC: 0 % (ref 0.0–0.2)

## 2021-04-23 LAB — RESP PANEL BY RT-PCR (FLU A&B, COVID) ARPGX2
Influenza A by PCR: NEGATIVE
Influenza B by PCR: NEGATIVE
SARS Coronavirus 2 by RT PCR: NEGATIVE

## 2021-04-23 LAB — BASIC METABOLIC PANEL
Anion gap: 9 (ref 5–15)
BUN: 31 mg/dL — ABNORMAL HIGH (ref 8–23)
CO2: 24 mmol/L (ref 22–32)
Calcium: 8.7 mg/dL — ABNORMAL LOW (ref 8.9–10.3)
Chloride: 99 mmol/L (ref 98–111)
Creatinine, Ser: 2.06 mg/dL — ABNORMAL HIGH (ref 0.61–1.24)
GFR, Estimated: 32 mL/min — ABNORMAL LOW (ref >60)
Glucose, Bld: 151 mg/dL — ABNORMAL HIGH (ref 70–99)
Potassium: 3.2 mmol/L — ABNORMAL LOW (ref 3.5–5.1)
Sodium: 132 mmol/L — ABNORMAL LOW (ref 135–145)

## 2021-04-23 LAB — CBG MONITORING, ED: Glucose-Capillary: 152 mg/dL — ABNORMAL HIGH (ref 70–99)

## 2021-04-23 LAB — CK: Total CK: 1208 U/L — ABNORMAL HIGH (ref 49–397)

## 2021-04-23 LAB — GLUCOSE, CAPILLARY: Glucose-Capillary: 103 mg/dL — ABNORMAL HIGH (ref 70–99)

## 2021-04-23 MED ORDER — INSULIN ASPART 100 UNIT/ML IJ SOLN
0.0000 [IU] | Freq: Four times a day (QID) | INTRAMUSCULAR | Status: DC
  Administered 2021-04-24 (×3): 4 [IU] via SUBCUTANEOUS
  Administered 2021-04-25: 11 [IU] via SUBCUTANEOUS
  Administered 2021-04-25: 7 [IU] via SUBCUTANEOUS
  Administered 2021-04-25: 4 [IU] via SUBCUTANEOUS
  Administered 2021-04-25: 7 [IU] via SUBCUTANEOUS
  Administered 2021-04-25: 4 [IU] via SUBCUTANEOUS
  Administered 2021-04-26: 11 [IU] via SUBCUTANEOUS

## 2021-04-23 MED ORDER — SODIUM CHLORIDE 0.9 % IV SOLN: Freq: Once | INTRAVENOUS | Status: AC

## 2021-04-23 MED ORDER — ASPIRIN EC 81 MG PO TBEC
81.0000 mg | DELAYED_RELEASE_TABLET | Freq: Every day | ORAL | Status: DC
  Administered 2021-04-24 – 2021-04-28 (×4): 81 mg via ORAL
  Filled 2021-04-23 (×4): qty 1

## 2021-04-23 MED ORDER — SODIUM CHLORIDE 0.9 % IV BOLUS
500.0000 mL | Freq: Once | INTRAVENOUS | Status: AC
  Administered 2021-04-23: 500 mL via INTRAVENOUS

## 2021-04-23 MED ORDER — AMITRIPTYLINE HCL 25 MG PO TABS
25.0000 mg | ORAL_TABLET | Freq: Every day | ORAL | Status: DC
  Administered 2021-04-24 – 2021-04-27 (×5): 25 mg via ORAL
  Filled 2021-04-23 (×7): qty 1

## 2021-04-23 MED ORDER — DULOXETINE HCL 60 MG PO CPEP
60.0000 mg | ORAL_CAPSULE | Freq: Every day | ORAL | Status: DC
  Administered 2021-04-24: 60 mg via ORAL
  Filled 2021-04-23: qty 1

## 2021-04-23 MED ORDER — HYDROCODONE-ACETAMINOPHEN 5-325 MG PO TABS
1.0000 | ORAL_TABLET | Freq: Four times a day (QID) | ORAL | Status: DC | PRN
  Administered 2021-04-23: 2 via ORAL
  Administered 2021-04-24 – 2021-04-28 (×3): 1 via ORAL
  Filled 2021-04-23: qty 2
  Filled 2021-04-23 (×5): qty 1

## 2021-04-23 MED ORDER — HYDROMORPHONE HCL 1 MG/ML IJ SOLN
1.0000 mg | INTRAMUSCULAR | Status: DC | PRN
  Administered 2021-04-25 – 2021-04-26 (×3): 1 mg via INTRAVENOUS
  Filled 2021-04-23 (×3): qty 1

## 2021-04-23 MED ORDER — ALFUZOSIN HCL ER 10 MG PO TB24
10.0000 mg | ORAL_TABLET | Freq: Every day | ORAL | Status: DC
  Administered 2021-04-24: 10 mg via ORAL
  Filled 2021-04-23 (×2): qty 1

## 2021-04-23 MED ORDER — MORPHINE SULFATE (PF) 4 MG/ML IV SOLN
4.0000 mg | Freq: Once | INTRAVENOUS | Status: AC
Start: 2021-04-23 — End: 2021-04-23
  Administered 2021-04-23: 4 mg via INTRAVENOUS
  Filled 2021-04-23: qty 1

## 2021-04-23 MED ORDER — LACTATED RINGERS IV SOLN: INTRAVENOUS | Status: DC

## 2021-04-23 MED ORDER — SENNOSIDES-DOCUSATE SODIUM 8.6-50 MG PO TABS: 1.0000 | ORAL_TABLET | Freq: Every evening | ORAL | Status: DC | PRN

## 2021-04-23 MED ORDER — POTASSIUM CHLORIDE CRYS ER 20 MEQ PO TBCR
40.0000 meq | EXTENDED_RELEASE_TABLET | Freq: Once | ORAL | Status: AC
  Administered 2021-04-23: 40 meq via ORAL
  Filled 2021-04-23: qty 2

## 2021-04-23 NOTE — ED Notes (Signed)
Patient transported to CT 

## 2021-04-23 NOTE — H&P (Signed)
History and Physical    [name] QIW:[id number] DOB: 1941-05-07 DOA: 04/23/2021  PCP: Libby Maw, MD   Patient coming from: Home  Chief Complaint: Right hip pain, cannot stand up  HPI: [name] is a 80 y.o. 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 He reports that around 1 am he reached over the nightstand and was going to adjust his CPAP machine when he knocked a clock onto the floor.  He got up to pick up the clock but he lost his balance and fell over landing on his right hip.  He reports instantaneous pain in the right hip and he was not able to stand up.  He laid on the floor until approximately 5:30 in the morning when his wife found him laying on the floor.  EMS was called and firemen came to the house and were able to lift him get him back into the bed.  At that time he refused transport to the hospital for further evaluation.  He continued to have severe right hip pain was unable to stand up or bear weight on his right leg.  He had increased pain with movement of his right hip or leg.  Reports the pain was a 10 out of 10 at its worst.  States he did not hit his head and he had no loss of consciousness.  He has not had any chest pain, palpitations, cough, shortness of breath.  He denies any urinary symptoms.  Lives with his wife and brother.  He has a 62-pack-year history of smoking but quit in 1984.  Denies alcohol or illicit drug use.  He is a retired Engineer, structural  ED Course: In the emergency room patient is found to have a nondisplaced fracture of the lateral femoral neck junction on the right.  He is also found to have mild rhabdomyolysis with a CK over 1200.  He has been hemodynamically stable in the emergency room.  COVID-19 swab is negative.  Influenza swab negative.  CBC is normal.  Sodium level 132, potassium 3.2, chloride 99, bicarb 24, creatinine 2.06 is  increased from baseline of 1.4-1.6.  BUN is 31.  Orthopedic surgery was consulted and will see patient in the morning.  Hospitalist service been asked to admit for further management  Review of Systems:  General: Denies fever, chills, weight loss, night sweats.  Denies dizziness.  Denies change in appetite HENT: Denies head trauma, headache, denies change in hearing, tinnitus.  Denies nasal congestion.  Denies sore throat, sores in mouth.  Denies difficulty swallowing Eyes: Denies blurry vision, pain in eye, drainage.  Denies discoloration of eyes. Neck: Denies pain.  Denies swelling.  Denies pain with movement. Cardiovascular: Denies chest pain, palpitations.  Denies edema.  Denies orthopnea Respiratory: Denies shortness of breath, cough.  Denies wheezing.  Denies sputum production Gastrointestinal: Denies abdominal pain, swelling.  Denies nausea, vomiting, diarrhea.  Denies melena.  Denies hematemesis. Musculoskeletal: Reports right hip pain and limitation of movement.   Genitourinary: Denies pelvic pain.  Denies urinary frequency or hesitancy.  Denies dysuria.  Skin: Denies rash.  Denies petechiae, purpura, ecchymosis. Neurological: Denies syncope. Denies seizure activity. Denies paresthesia. Denies slurred speech, drooping face.  Denies visual change. Psychiatric: Denies depression, anxiety.  Denies hallucinations.  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   Gynecomastia    Hepatic steatosis    HYPERLIPIDEMIA 05/20/2007   Hypertension    Hypogonadism male    Morbid obesity ([place]) 09/10/2009    OBSTRUCTIVE SLEEP APNEA 11/04/2007   Personal history of colonic polyps    RENAL INSUFFICIENCY 05/12/2008   Sleep apnea    uses cpap   Squamous cell carcinoma of skin 02/16/2020   left lower leg, anterior-cx3,74f   Urolithiasis     Past Surgical History:  Procedure Laterality Date   Abdominal UKorea 09/1997   arm fracture Left 1958   with hardware   Colon cancer screening     COLONOSCOPY  08/18/2004   diverticulitis   COLONOSCOPY  09/24/2009   ELECTROCARDIOGRAM  02/2006   FLEXIBLE SIGMOIDOSCOPY  03/04/2001   polyps, anal fissure   GANGLION CYST EXCISION Left 1980   Lower Arterial  04/13/2004   POLYPECTOMY     Rest Cardiolite  03/19/2003    Social History  reports that he quit smoking about 38 years ago. His smoking use included cigarettes. He has a 62.00 pack-year smoking history. He has never used smokeless tobacco. He reports that he does not drink alcohol and does not use drugs.  Allergies  Allergen Reactions   Atorvastatin Other (See Comments)    unknown   Niacin     REACTION: Severe heartburn   Pioglitazone     REACTION: Edema    Family History  Problem Relation Age of Onset   Colon cancer Brother 57  Colon polyps Brother    Lung cancer Brother    Other Mother        MVA, deceased 533s  Healthy Daughter    Healthy Son    Rectal cancer Neg Hx    Stomach cancer Neg Hx      Prior to Admission medications   Medication Sig Start Date End Date Taking? Authorizing Provider  alfuzosin (UROXATRAL) 10 MG 24 hr tablet TAKE 1 TABLET BY MOUTH  DAILY WITH BREAKFAST Patient taking differently: Take 10 mg by mouth daily with breakfast. 10/08/20  Yes KLibby Maw MD  allopurinol (ZYLOPRIM) 300 MG tablet TAKE 1 TABLET BY MOUTH  DAILY 06/23/20  Yes KLibby Maw MD  amitriptyline (ELAVIL) 50 MG tablet Take 25 mg by mouth at bedtime. 02/23/21  Yes [provider]  aspirin 81 MG EC tablet Take 81 mg by mouth daily.   Yes [provider]   cetirizine (ZYRTEC) 10 MG tablet Take 10 mg by mouth daily as needed for allergies.   Yes [provider]  diclofenac Sodium (VOLTAREN) 1 % GEL Apply 2 g topically 4 (four) times daily. Patient taking differently: Apply 2 g topically 2 (two) times daily as needed (pain). 07/06/20  Yes SAundra Dubin PA-C  Dulaglutide (TRULICITY) 4.5 MCL/2.7NTSOPN Inject 4.5 mg as directed once a week. 02/18/21  Yes ERenato Shin MD  DULoxetine (CYMBALTA) 60 MG capsule TAKE 1 CAPSULE BY MOUTH  DAILY 11/01/20  Yes PJamse Arn MD  fluticasone ([facility] 50 MCG/ACT nasal spray Place 1 spray into the nose daily as needed for  allergies.    Yes [provider]  folic acid (FOLVITE) 1 MG tablet Take 1 mg by mouth daily.   Yes [provider]  furosemide (LASIX) 20 MG tablet Take 20 mg by mouth See admin instructions. Take 1 tablet (20 mg) on MWF 02/18/21  Yes [provider]  HUMALOG 100 UNIT/ML injection Inject into the skin. Insulin Pump. 300 units over 72 Hours 03/14/21  Yes [provider]  Multiple Vitamins-Minerals (CENTRUM SILVER PO) Take 1 tablet by mouth daily.   Yes [provider]  Omega-3 Fatty Acids (FISH OIL) 1200 MG CAPS Take 1,200 mg by mouth daily.   Yes [provider]  omeprazole (PRILOSEC) 20 MG capsule Take 1 capsule (20 mg total) by mouth daily as needed. Patient taking differently: Take 20 mg by mouth daily as needed (acid reflux). 02/03/19  Yes Libby Maw, MD  rosuvastatin (CRESTOR) 40 MG tablet TAKE 1 TABLET BY MOUTH AT  BEDTIME 12/28/20  Yes Libby Maw, MD  vitamin B-12 (CYANOCOBALAMIN) 1000 MCG tablet Take 1,000 mcg by mouth daily.   Yes [provider]  Accu-Chek FastClix Lancets MISC USE TO CHECK BLOOD SUGAR UP TO 6 TIMES DAILY 02/22/19   Renato Shin, MD  amitriptyline (ELAVIL) 25 MG tablet TAKE 1 TABLET(25 MG) BY MOUTH AT BEDTIME Patient not taking: No sig reported 03/03/20   Jamse Arn, MD   Blood Glucose Monitoring Suppl (ACCU-CHEK AVIVA PLUS) w/Device KIT Use as directed 04/21/16   Renato Shin, MD  cetirizine (ZYRTEC) 5 MG tablet Take 1 tablet (5 mg total) by mouth daily. Patient not taking: No sig reported 07/30/20   Nche, Charlene Brooke, NP  Continuous Blood Gluc Sensor (DEXCOM G6 SENSOR) MISC Inject 1 Device into the skin as directed. Apply to skin SQ every 10 days 04/02/19   Renato Shin, MD  glucose blood (ACCU-CHEK AVIVA PLUS) test strip USE TO TEST BLOOD SUGAR 6  TIMES PER DAY; E11.9 10/04/18   Renato Shin, MD  Insulin Infusion Pump (T: SLIM X2 INS PMP/CONTROL 7.4) DEVI by Does not apply route.    [provider]  Insulin Infusion Pump Supplies (AUTOSOFT XC INFUSION SET) MISC Inject 1 Act into the skin every other day. Use with 61m cannula and 23 inch tubing. *5 boxes (90 day supply) 04/02/19   ERenato Shin MD  Insulin Infusion Pump Supplies (T:SLIM INSULIN CARTRIDGE 3ML) MISC Use with insulin pump, fill once every 2 days. 04/02/19   ERenato Shin MD  Insulin Lispro-aabc 100 UNIT/ML SOLN For use in pump, total of 120 units each morning Patient not taking: No sig reported 01/11/21   ERenato Shin MD  mometasone (ELOCON) 0.1 % lotion APPLY TOPICALLY DAILY Patient not taking: No sig reported 05/12/19   KLibby Maw MD  PEG-KCl-NaCl-NaSulf-Na Asc-C (PLENVU) 140 g SOLR Take 140 g by mouth as directed. Manufacturer's coupon Universal coupon code:BIN: [id number] GROUP: [id number] PCN: [id number]; ID:: [id number] PAY NO MORE $50 Patient not taking: No sig reported 07/23/20   DDoran Stabler MD    Physical Exam: Vitals:   04/23/21 1915 04/23/21 1946 04/23/21 2100 04/23/21 2130  BP: 112/63 117/60 113/61 119/60  Pulse: 90 74 86 83  Resp: '16 15 16 16  ' Temp:      TempSrc:      SpO2: 91% 93% 91% 97%  Weight:      Height:        Constitutional: NAD, calm, comfortable Vitals:   04/23/21 1915 04/23/21 1946  04/23/21 2100 04/23/21 2130  BP: 112/63 117/60 113/61  119/60  Pulse: 90 74 86 83  Resp: '16 15 16 16  ' Temp:      TempSrc:      SpO2: 91% 93% 91% 97%  Weight:      Height:       General: WDWN, Alert and oriented x3.  Eyes: EOMI, PERRL, conjunctivae normal.  Sclera nonicteric HENT:  Thermal/AT, external ears normal.  Nares patent without epistasis.  Mucous membranes are moist. Neck: Soft, normal range of motion, supple, no masses,Trachea midline Respiratory: clear to auscultation bilaterally, no wheezing, no crackles. Normal respiratory effort. No accessory muscle use.  Cardiovascular: Regular rate and rhythm, 2/6 systolic murmur. No rubs / gallops. No extremity edema. 2+ pedal pulses.   Abdomen: Soft, no tenderness, nondistended, no rebound or guarding.  No masses palpated. Obese. Bowel sounds normoactive Musculoskeletal: Right hip pain to palpation anteriorly and laterally. no cyanosis. No joint deformity upper and lower extremities. Normal muscle tone.  Skin: Warm, dry, intact no rashes, lesions, ulcers. No induration Neurologic: CN 2-12 grossly intact.  Normal speech.  Sensation intact, Grip Strength 5/5 .   Psychiatric: Normal judgment and insight.  Normal mood.    Labs on Admission: I have personally reviewed following labs and imaging studies  CBC: Recent Labs  Lab 04/23/21 1842  WBC 10.0  NEUTROABS 8.9*  HGB 13.7  HCT 40.3  MCV 89.8  PLT 101*    Basic Metabolic Panel: Recent Labs  Lab 04/23/21 1842  NA 132*  K 3.2*  CL 99  CO2 24  GLUCOSE 151*  BUN 31*  CREATININE 2.06*  CALCIUM 8.7*    GFR: Estimated Creatinine Clearance: 39.7 mL/min (A) (by C-G formula based on SCr of 2.06 mg/dL (H)).  Liver Function Tests: No results for input(s): AST, ALT, ALKPHOS, BILITOT, PROT, ALBUMIN in the last 168 hours.  Urine analysis:    Component Value Date/Time   COLORURINE AMBER (A) 04/23/2021 1834   APPEARANCEUR HAZY (A) 04/23/2021 1834   LABSPEC 1.018 04/23/2021 1834   PHURINE 5.0 04/23/2021 1834   GLUCOSEU NEGATIVE  04/23/2021 1834   GLUCOSEU NEGATIVE 02/28/2021 0953   HGBUR NEGATIVE 04/23/2021 1834   BILIRUBINUR NEGATIVE 04/23/2021 1834   BILIRUBINUR negative 09/21/2020 1408   KETONESUR NEGATIVE 04/23/2021 1834   PROTEINUR 30 (A) 04/23/2021 1834   UROBILINOGEN 2.0 (A) 02/28/2021 0953   NITRITE NEGATIVE 04/23/2021 1834   LEUKOCYTESUR MODERATE (A) 04/23/2021 1834    Radiological Exams on Admission: CT Hip Right Wo Contrast  Result Date: 04/23/2021 CLINICAL DATA:  Hip trauma, fracture suspected, neg xray Fall last night with right hip pain. EXAM: CT OF THE RIGHT HIP WITHOUT CONTRAST TECHNIQUE: Multidetector CT imaging of the right hip was performed according to the standard protocol. Multiplanar CT image reconstructions were also generated. COMPARISON:  Radiograph earlier today. FINDINGS: Bones/Joint/Cartilage Slight cortical step-off of the femoral head neck junction, best appreciated on coronal series 7, images 35-37, suspicious for incomplete nondisplaced fracture. Femoral head is well seated in the acetabulum. No acetabular or pubic rami fractures. There is mild acetabular spurring. Ligaments Suboptimally assessed by CT. Muscles and Tendons No confluent muscle hematoma or muscle atrophy. There is an intramuscular lipoma within the obturator external muscle. Soft tissues Prominent prostate gland. IMPRESSION: Slight cortical step-off of the lateral femoral head neck junction, suspicious for incomplete nondisplaced femoral neck fracture. Electronically Signed   By: Keith Rake M.D.   On: 04/23/2021 20:50   DG Chest Port 1  View  Result Date: 04/23/2021 CLINICAL DATA:  Acute pain following fall. EXAM: PORTABLE CHEST 1 VIEW COMPARISON:  None. FINDINGS: Increased density overlying the UPPER LEFT lung may be related to the end of the LEFT 1st rib. The cardiomediastinal silhouette is unremarkable. There is no evidence of focal airspace disease, pulmonary edema, pleural effusion, or pneumothorax. No acute bony  abnormalities are identified. IMPRESSION: Increased density overlying the UPPER LEFT lung which may be related to the end of the LEFT 1st rib. Recommend PA and lateral chest x-ray for further evaluation. No other significant abnormality. Electronically Signed   By: Margarette Canada M.D.   On: 04/23/2021 19:53   DG Hip Unilat W or Wo Pelvis 2-3 Views Right  Result Date: 04/23/2021 CLINICAL DATA:  Acute RIGHT hip pain following fall yesterday. Initial encounter. EXAM: DG HIP (WITH OR WITHOUT PELVIS) 2-3V RIGHT COMPARISON:  None. FINDINGS: There is no evidence of hip fracture or dislocation. There is no evidence of arthropathy or other focal bone abnormality. IMPRESSION: Negative. Electronically Signed   By: Margarette Canada M.D.   On: 04/23/2021 19:50    EKG: Independently reviewed.  EKG shows normal sinus rhythm without acute ST changes with no acute ST elevation or depression.  QTc 427  Assessment/Plan Principal Problem:   Closed right hip fracture Mr. Aldrete is admitted to Med/Surg floor.  Orthopedics has been consulted to see patient in the morning.  Patient kept n.p.o. after midnight until orthopedic surgery sees him Pain control provided  Active Problems:   Rhabdomyolysis Mr. Bula laid on the floor for approximately 5 hours.  CK level is elevated.  Given 1 L of normal saline in the emergency room IV fluid hydration with LR at 150 ml/hr overnight. Recheck CK level in the morning    Acute kidney injury superimposed on CKD  Increased creatinine level from baseline.  IV fluid hydration with LR overnight. Recheck electrolytes renal function in morning    Type 2 diabetes mellitus without complication, with long-term current use of insulin Patient has insulin pump at home which she took off prior to coming to the hospital.  We will monitor blood sugars every 6 hours and provide sliding scale insulin as needed for glycemic control.  Patient had a hemoglobin A1c less than 2 months ago that was 6.7 so would  not be repeated at this time    Obstructive sleep apnea Continue home CPAP as he uses at home.  RT to follow    Hypokalemia Will be decreased.  Oral potassium will be given.  Check magnesium level and if low will replete    DVT prophylaxis: SCDs for DVT prophylaxis.   Code Status:   Full Code  Family Communication:  Diagnosis and plan discussed with patient.  Patient verbalized understanding agrees with plan.  Further recommendations to follow as clinically indicated Disposition Plan:   Patient is from:  Home  Anticipated DC to:  Home versus rehab which will be determined during hospitalization  Anticipated DC date:  Anticipate 2 midnight or more stay    Consults called:  Orthopedic surgery consulted by Er physician and will see in am.  Admission status:  Inpatient   Yevonne Aline Azriel Dancy MD [facility]  How to contact the [facility] Attending or Consulting provider [place] or covering provider during after hours 7P -7A, for this patient?   Check the care team in [facility] and look for a) attending/consulting [facility] provider listed and b) the [facility] team listed Log into [email or web address]  and use [place] universal password to access. If you do not have the password, please contact the hospital operator. Locate the [facility] provider you are looking for under [facility] and page to a number that you can be directly reached. If you still have difficulty reaching the provider, please page the [facility] (Director on Call) for the Hospitalists listed on amion for assistance.  04/23/2021, 10:00 PM

## 2021-04-23 NOTE — ED Provider Notes (Signed)
[facility] DEPT Provider Note   CSN: [id number] Arrival date & time: 04/23/21  [place]     History Chief Complaint  Patient presents with   Right Hip Pain    [name] is a 80 y.o. male.  80 year old male with prior medical history as detailed below presents for evaluation.  Patient reports that he had a fall last night at home in his bedroom.  He landed hard on his right hip.  He was unable to get off the floor secondary to pain.  This morning 5 department came and lifted him onto a couch.  He reports that he refused transport at that time.  He has been unable to walk or bear weight on the right hip since the fall.  He denies other significant injury.  He denies head injury or loss conscious.  He denies associated chest pain or shortness of breath.  The history is provided by the patient and medical records.  Fall This is a new problem. The current episode started 6 to 12 hours ago. The problem occurs rarely. The problem has not changed since onset.Pertinent negatives include no chest pain and no abdominal pain. The symptoms are aggravated by standing. Nothing relieves the symptoms.      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[place])    Duodenitis    Edema 05/12/2008   Esophageal stricture    GERD (gastroesophageal reflux disease)    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   Gynecomastia    Hepatic steatosis    HYPERLIPIDEMIA 05/20/2007   Hypertension    Hypogonadism male    Morbid obesity ([place]) 09/10/2009   OBSTRUCTIVE SLEEP APNEA 11/04/2007   Personal history of colonic polyps    RENAL INSUFFICIENCY 05/12/2008   Sleep apnea    uses cpap   Squamous cell carcinoma of skin 02/16/2020   left lower leg,  anterior-cx3,7f   Urolithiasis     Patient Active Problem List   Diagnosis Date Noted   Abnormality of gait 02/21/2021   Renal insufficiency 02/21/2021   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[place] 11/17/2020   Aortic atherosclerosis ([place] 11/17/2020   Coronary artery calcification 11/17/2020   Hypokalemia 11/17/2020   Obesity, diabetes, and hypertension syndrome ([place] 09/23/2020   Other hyperlipidemia 09/23/2020   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 sciatica 03/15/2020   Diabetic peripheral neuropathy ([place] 12/16/2019   Myalgia 12/16/2019   Transient visual disturbance 10/20/2019   Elevated TSH 10/09/2019   Stage 3a chronic kidney disease ([place] 10/09/2019   Neuropathic pain 09/15/2019   Elevated LDL cholesterol level 07/10/2019   B12 deficiency 07/10/2019   Iron deficiency 07/10/2019   History of fall within past 90 days 07/10/2019   History of gout 07/10/2019   Skin lesion of left leg 07/10/2019   Frequent falls 06/18/2019   Gait disturbance 06/18/2019   Fall on same level from slipping, tripping, or stumbling 06/09/2019   Lung nodules 05/08/2019   Sleep disturbance 05/01/2019   Fall (on)(from) sidewalk curb, initial encounter 04/18/2019   Vitamin D deficiency 04/18/2019   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   Excessive cerumen in left ear canal 10/29/2018   Trigger finger, right ring finger 08/13/2018   Chest wall muscle  strain 07/30/2018   Need for influenza vaccination 07/30/2018   Grieving 06/25/2018   ETD (Eustachian tube dysfunction), left 06/25/2018   Unilateral primary osteoarthritis, right knee 11/17/2016   Complex tear of lateral meniscus of right knee as current injury 10/26/2016   Radiculitis, lumbosacral 06/30/2016   Diabetes ([place]) 01/01/2016   Solitary pulmonary nodule 12/30/2015   Urolithiasis 02/23/2015   Paresthesia 09/07/2014   Routine general medical examination at a health care facility 07/07/2014   Disturbance of skin sensation 12/26/2013   UTI (urinary tract  infection) 03/12/2013   Screening for prostate cancer 02/14/2012   Right knee pain 11/15/2011   Encounter for long-term (current) use of other medications 01/17/2011   Special screening examination for neoplasm of prostate 01/17/2011   MYCOSIS FUNGOIDES 12/29/2009   HEARING LOSS 09/30/2009   ECZEMA 09/30/2009   VITAMIN B12 DEFICIENCY 09/10/2009   MORBID OBESITY 09/10/2009   SYNCOPE 74/94/4967   DIASTOLIC DYSFUNCTION 59/16/3846   CARDIAC MURMUR, SYSTOLIC 65/99/3570   Pancytopenia ([place]) 05/12/2008   Disorder resulting from impaired renal function 05/12/2008   EDEMA 05/12/2008   Obstructive sleep apnea 11/04/2007   BACK PAIN, LUMBAR 10/23/2007   Dyslipidemia 05/20/2007   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   Essential hypertension 05/20/2007    Past Surgical History:  Procedure Laterality Date   Abdominal US  09/1997   arm fracture Left 1958   with hardware   Colon cancer screening     COLONOSCOPY  08/18/2004   diverticulitis   COLONOSCOPY  09/24/2009   ELECTROCARDIOGRAM  02/2006   FLEXIBLE SIGMOIDOSCOPY  03/04/2001   polyps, anal fissure   GANGLION CYST EXCISION Left 1980   Lower Arterial  04/13/2004   POLYPECTOMY     Rest Cardiolite  03/19/2003       Family History  Problem Relation Age of Onset   Colon cancer Brother 34   Colon polyps Brother    Lung cancer Brother    Other Mother        MVA, deceased 33s   Healthy Daughter    Healthy Son    Rectal cancer Neg Hx    Stomach cancer Neg Hx     Social History   Tobacco Use   Smoking status: Former    Packs/day: 2.00    Years: 31.00    Pack years: 62.00    Types: Cigarettes    Quit date: 10/23/1982    Years since quitting: 38.5   Smokeless tobacco: Never  Vaping Use   Vaping Use: Never used  Substance Use Topics   Alcohol use: No    Alcohol/week: 0.0 standard drinks   Drug use: No    Home Medications Prior to Admission medications   Medication Sig Start Date End Date Taking? Authorizing Provider  Accu-Chek FastClix  Lancets MISC USE TO CHECK BLOOD SUGAR UP TO 6 TIMES DAILY 02/22/19   Renato Shin, MD  alfuzosin (UROXATRAL) 10 MG 24 hr tablet TAKE 1 TABLET BY MOUTH  DAILY WITH BREAKFAST 10/08/20   Libby Maw, MD  allopurinol (ZYLOPRIM) 300 MG tablet TAKE 1 TABLET BY MOUTH  DAILY 06/23/20   Libby Maw, MD  amitriptyline (ELAVIL) 25 MG tablet TAKE 1 TABLET(25 MG) BY MOUTH AT BEDTIME 03/03/20   Jamse Arn, MD  aspirin 81 MG EC tablet Take 81 mg by mouth daily.    [provider]  Blood Glucose Monitoring Suppl (ACCU-CHEK AVIVA PLUS) w/Device KIT Use as directed 04/21/16   Renato Shin, MD  cetirizine (ZYRTEC) 5 MG tablet  Take 1 tablet (5 mg total) by mouth daily. 07/30/20   Nche, Charlene Brooke, NP  Continuous Blood Gluc Sensor (DEXCOM G6 SENSOR) MISC Inject 1 Device into the skin as directed. Apply to skin SQ every 10 days 04/02/19   Renato Shin, MD  diclofenac Sodium (VOLTAREN) 1 % GEL Apply 2 g topically 4 (four) times daily. 07/06/20   Aundra Dubin, PA-C  Dulaglutide (TRULICITY) 4.5 ZC/5.8IF SOPN Inject 4.5 mg as directed once a week. 02/18/21   Renato Shin, MD  DULoxetine (CYMBALTA) 60 MG capsule TAKE 1 CAPSULE BY MOUTH  DAILY 11/01/20   Jamse Arn, MD  fluticasone [facility]) 50 MCG/ACT nasal spray Place 1 spray into the nose daily as needed for allergies.     [provider]  folic acid (FOLVITE) 1 MG tablet Take 2 mg by mouth 2 (two) times daily. 4 tabs daily    [provider]  glucose blood (ACCU-CHEK AVIVA PLUS) test strip USE TO TEST BLOOD SUGAR 6  TIMES PER DAY; E11.9 10/04/18   Renato Shin, MD  Insulin Infusion Pump (T: SLIM X2 INS PMP/CONTROL 7.4) DEVI by Does not apply route.    [provider]  Insulin Infusion Pump Supplies (AUTOSOFT XC INFUSION SET) MISC Inject 1 Act into the skin every other day. Use with 36m cannula and 23 inch tubing. *5 boxes (90 day supply) 04/02/19   ERenato Shin MD  Insulin Infusion Pump Supplies  (T:SLIM INSULIN CARTRIDGE 3ML) MISC Use with insulin pump, fill once every 2 days. 04/02/19   ERenato Shin MD  Insulin Lispro-aabc 100 UNIT/ML SOLN For use in pump, total of 120 units each morning 01/11/21   ERenato Shin MD  mometasone (ELOCON) 0.1 % lotion APPLY TOPICALLY DAILY 05/12/19   KLibby Maw MD  Multiple Vitamins-Minerals (CENTRUM SILVER PO) Take 1 tablet by mouth daily.    [provider]  Omega-3 Fatty Acids (FISH OIL) 1200 MG CAPS Take 1,200 mg by mouth daily.    [provider]  omeprazole (PRILOSEC) 20 MG capsule Take 1 capsule (20 mg total) by mouth daily as needed. 02/03/19   KLibby Maw MD  PEG-KCl-NaCl-NaSulf-Na Asc-C (PLENVU) 140 g SOLR Take 140 g by mouth as directed. Manufacturer's coupon Universal coupon code:BIN: [id number] GROUP: [id number] PCN: [id number]; ID:: [id number] PAY NO MORE $50 Patient not taking: Reported on 02/21/2021 07/23/20   DDoran Stabler MD  rosuvastatin (CRESTOR) 40 MG tablet TAKE 1 TABLET BY MOUTH AT  BEDTIME 12/28/20   KLibby Maw MD  vitamin B-12 (CYANOCOBALAMIN) 1000 MCG tablet Take 1,000 mcg by mouth daily.    [provider]    Allergies    Atorvastatin, Niacin, and Pioglitazone  Review of Systems   Review of Systems  Cardiovascular:  Negative for chest pain.  Gastrointestinal:  Negative for abdominal pain.  All other systems reviewed and are negative.  Physical Exam Updated Vital Signs BP 109/64 (BP Location: Right Arm)   Pulse 100   Temp 98.8 F (37.1 C) (Oral)   Resp 16   Ht 6' (1.829 m)   Wt 125.1 kg   SpO2 94%   BMI 37.40 kg/m   Physical Exam Vitals and nursing note reviewed.  Constitutional:      General: He is not in acute distress.    Appearance: Normal appearance. He is well-developed.  HENT:     Head: Normocephalic and atraumatic.  Eyes:     Conjunctiva/sclera: Conjunctivae normal.     Pupils:  Pupils are equal, round, and reactive to light.  Cardiovascular:      Rate and Rhythm: Normal rate and regular rhythm.     Heart sounds: Normal heart sounds.  Pulmonary:     Effort: Pulmonary effort is normal. No respiratory distress.     Breath sounds: Normal breath sounds.  Abdominal:     General: There is no distension.     Palpations: Abdomen is soft.     Tenderness: There is no abdominal tenderness.  Musculoskeletal:        General: Tenderness present. No deformity. Normal range of motion.     Cervical back: Normal range of motion and neck supple.     Comments: Tender to palpation over the lateral aspect of the right hip   Skin:    General: Skin is warm and dry.  Neurological:     General: No focal deficit present.     Mental Status: He is alert and oriented to person, place, and time.    ED Results / Procedures / Treatments   Labs (all labs ordered are listed, but only abnormal results are displayed) Labs Reviewed  BASIC METABOLIC PANEL  CBC WITH DIFFERENTIAL/PLATELET  URINALYSIS, ROUTINE W REFLEX MICROSCOPIC  CBG MONITORING, ED    EKG None  Radiology No results found.  Procedures Procedures   Medications Ordered in ED Medications  morphine 4 MG/ML injection 4 mg (has no administration in time range)    ED Course  I have reviewed the triage vital signs and the nursing notes.  Pertinent labs & imaging results that were available during my care of the patient were reviewed by me and considered in my medical decision making (see chart for details).    MDM Rules/Calculators/A&P                          MDM  MSE complete  Morton Simson was evaluated in Emergency Department on 04/23/2021 for the symptoms described in the history of present illness. He was evaluated in the context of the global COVID-19 pandemic, which necessitated consideration that the patient might be at risk for infection with the SARS-CoV-2 virus that causes COVID-19. Institutional protocols and algorithms that pertain to the evaluation of patients at risk  for COVID-19 are in a state of rapid change based on information released by regulatory bodies including the CDC and federal and state organizations. These policies and algorithms were followed during the patient's care in the ED.  Is presenting with complaint of right hip pain after fall.  Patient was unable to ambulate on the right leg secondary to pain in the right hip.  Patient has been on the floor or on a couch for the last 27 hours.  CK is mildly elevated.  Creatinine is mildly elevated.  CT imaging of the right hip suggest nondisplaced femoral neck fracture.  Dr. Rolena Infante of orthopedics is aware of case.  He will consult.  Patient to be n.p.o. after midnight.  Hospitalist service is aware of case and will evaluate for admission.   Final Clinical Impression(s) / ED Diagnoses Final diagnoses:  Closed fracture of right hip, initial encounter ([place])  Elevated CK    Rx / DC Orders ED Discharge Orders     None        Valarie Merino, MD 04/23/21 2135

## 2021-04-23 NOTE — ED Notes (Signed)
Patient transported to X-ray 

## 2021-04-23 NOTE — ED Triage Notes (Signed)
Pt BIB [facility] c/o right hip pain after a fall last night. Pt believes he fell on his right hip but is unsure. Pt slept on floor overnight and wife called EMS this morning. EMS was initially  called out this morning, they helped pt off floor transported to hospital. Pt remained on couch since 10am this morning and was unable to move due to right hip pain. EMS was called again to transport Pt. Pt Is type one diabetic and has insulin pump in place.   108/96 100-HR 18-RR 98% on RA

## 2021-04-24 DIAGNOSIS — S72001D Fracture of unspecified part of neck of right femur, subsequent encounter for closed fracture with routine healing: Secondary | ICD-10-CM

## 2021-04-24 LAB — GLUCOSE, CAPILLARY
Glucose-Capillary: 167 mg/dL — ABNORMAL HIGH (ref 70–99)
Glucose-Capillary: 173 mg/dL — ABNORMAL HIGH (ref 70–99)
Glucose-Capillary: 191 mg/dL — ABNORMAL HIGH (ref 70–99)
Glucose-Capillary: 116 mg/dL — ABNORMAL HIGH (ref 70–99)

## 2021-04-24 LAB — BASIC METABOLIC PANEL
Anion gap: 9 (ref 5–15)
Anion gap: 10 (ref 5–15)
BUN: 30 mg/dL — ABNORMAL HIGH (ref 8–23)
BUN: 33 mg/dL — ABNORMAL HIGH (ref 8–23)
CO2: 24 mmol/L (ref 22–32)
CO2: 25 mmol/L (ref 22–32)
Calcium: 8.4 mg/dL — ABNORMAL LOW (ref 8.9–10.3)
Calcium: 8.7 mg/dL — ABNORMAL LOW (ref 8.9–10.3)
Chloride: 99 mmol/L (ref 98–111)
Chloride: 98 mmol/L (ref 98–111)
Creatinine, Ser: 1.86 mg/dL — ABNORMAL HIGH (ref 0.61–1.24)
Creatinine, Ser: 1.76 mg/dL — ABNORMAL HIGH (ref 0.61–1.24)
GFR, Estimated: 36 mL/min — ABNORMAL LOW (ref >60)
GFR, Estimated: 39 mL/min — ABNORMAL LOW (ref >60)
Glucose, Bld: 138 mg/dL — ABNORMAL HIGH (ref 70–99)
Glucose, Bld: 236 mg/dL — ABNORMAL HIGH (ref 70–99)
Potassium: 3.6 mmol/L (ref 3.5–5.1)
Potassium: 3.7 mmol/L (ref 3.5–5.1)
Sodium: 132 mmol/L — ABNORMAL LOW (ref 135–145)
Sodium: 133 mmol/L — ABNORMAL LOW (ref 135–145)

## 2021-04-24 LAB — CK: Total CK: 1155 U/L — ABNORMAL HIGH (ref 49–397)

## 2021-04-24 LAB — MAGNESIUM: Magnesium: 1.5 mg/dL — ABNORMAL LOW (ref 1.7–2.4)

## 2021-04-24 LAB — SURGICAL PCR SCREEN
MRSA, PCR: NEGATIVE
Staphylococcus aureus: NEGATIVE

## 2021-04-24 MED ORDER — CHLORHEXIDINE GLUCONATE 4 % EX LIQD
60.0000 mL | Freq: Once | CUTANEOUS | Status: AC
Start: 2021-04-25 — End: 2021-04-25
  Administered 2021-04-25: 4 via TOPICAL
  Filled 2021-04-24 (×2): qty 60

## 2021-04-24 MED ORDER — MAGNESIUM SULFATE 4 GM/100ML IV SOLN
4.0000 g | Freq: Once | INTRAVENOUS | Status: AC
  Administered 2021-04-24: 4 g via INTRAVENOUS
  Filled 2021-04-24: qty 100

## 2021-04-24 MED ORDER — SODIUM CHLORIDE 0.9 % IV BOLUS
500.0000 mL | Freq: Once | INTRAVENOUS | Status: AC
  Administered 2021-04-24: 500 mL via INTRAVENOUS

## 2021-04-24 MED ORDER — TRANEXAMIC ACID-NACL 1000-0.7 MG/100ML-% IV SOLN
1000.0000 mg | INTRAVENOUS | Status: AC
  Administered 2021-04-25: 1000 mg via INTRAVENOUS
  Filled 2021-04-24: qty 100

## 2021-04-24 MED ORDER — SODIUM CHLORIDE 0.9 % IV SOLN: INTRAVENOUS | Status: DC

## 2021-04-24 MED ORDER — ACETAMINOPHEN 325 MG PO TABS
650.0000 mg | ORAL_TABLET | Freq: Four times a day (QID) | ORAL | Status: DC | PRN
  Administered 2021-04-24: 650 mg via ORAL
  Filled 2021-04-24: qty 2

## 2021-04-24 MED ORDER — POVIDONE-IODINE 10 % EX SWAB: 2.0000 "application " | Freq: Once | CUTANEOUS | Status: DC

## 2021-04-24 MED ORDER — CEFAZOLIN IN SODIUM CHLORIDE 3-0.9 GM/100ML-% IV SOLN
3.0000 g | INTRAVENOUS | Status: AC
  Administered 2021-04-25: 3 g via INTRAVENOUS
  Filled 2021-04-24: qty 100

## 2021-04-24 NOTE — Progress Notes (Signed)
Patient has a red MEWS, Temp-102.7 HR-118, on call provider (X.Blount) and charge nurse Gerald Stabs) notified. No prn orders in system.  Awaiting orders from on call provider. In the meantime, cooling measures initiated; ice packs placed under arm, room temperature lowered  and patient kept from under covers and blanket.

## 2021-04-24 NOTE — Progress Notes (Signed)
PROGRESS NOTE  Ibrahem Volkman BZJ:[id number] DOB: 1941/04/27 DOA: 04/23/2021 PCP: Libby Maw, MD  Brief History    [name] is a 80 y.o. 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 He reports that around 1 am he reached over the nightstand and was going to adjust his CPAP machine when he knocked a clock onto the floor.  He got up to pick up the clock but he lost his balance and fell over landing on his right hip.  He reports instantaneous pain in the right hip and he was not able to stand up.  He laid on the floor until approximately 5:30 in the morning when his wife found him laying on the floor.  EMS was called and firemen came to the house and were able to lift him get him back into the bed.  At that time he refused transport to the hospital for further evaluation.  He continued to have severe right hip pain was unable to stand up or bear weight on his right leg.  He had increased pain with movement of his right hip or leg.  Reports the pain was a 10 out of 10 at its worst.  States he did not hit his head and he had no loss of consciousness.  He has not had any chest pain, palpitations, cough, shortness of breath.  He denies any urinary symptoms.  Lives with his wife and brother.  He has a 62-pack-year history of smoking but quit in 1984.  Denies alcohol or illicit drug use.  He is a retired Engineer, structural   ED Course: In the emergency room patient is found to have a nondisplaced fracture of the lateral femoral neck junction on the right.  He is also found to have mild rhabdomyolysis with a CK over 1200.  He has been hemodynamically stable in the emergency room.  COVID-19 swab is negative.  Influenza swab negative.  CBC is normal.  Sodium level 132, potassium 3.2, chloride 99, bicarb 24, creatinine 2.06 is increased from baseline of 1.4-1.6.  BUN is 31.  Orthopedic surgery was consulted and  will see patient in the morning.  Hospitalist service been asked to admit for further management  The patient has been evaluated by orthopedic surgery physician's assistant, TRacy Shuford, PA-C. He has recommended that the patient and his films be evaluated by Dr. Lyla Glassing. Then Dr. Lyla Glassing will discuss surgery and alternative treatment options with the patient and his wife. The patient will be kept NPO after midnight in case the patient goes to surgery in the am.  Consultants  Orthopedic surgery  Procedures  None  Antibiotics   Anti-infectives (From admission, onward)    None      Subjective  The patient is resting comfortably. No new complaints.   Objective   Vitals:  Vitals:   04/24/21 0433 04/24/21 1325  BP: (!) 144/62 113/62  Pulse: 95 79  Resp: (!) 22 17  Temp: 99.5 F (37.5 C) 97.7 F (36.5 C)  SpO2: 95% 100%    Exam:  Constitutional:  The patient is awake, alert, and oriented x 3. No acute distress. Respiratory:  No increased work of breathing. No wheezes, rales, or rhonchi No tactile fremitus Cardiovascular:  Regular rate and rhythm No murmurs, ectopy, or gallups. No lateral PMI. No thrills. Abdomen:  Abdomen is soft, non-tender, non-distended No hernias, masses, or organomegaly Normoactive bowel  sounds.  Musculoskeletal:  No cyanosis, clubbing, or edema Skin:  No rashes, lesions, ulcers palpation of skin: no induration or nodules Neurologic:  CN 2-12 intact Sensation all 4 extremities intact Psychiatric:  Mental status Mood, affect appropriate Orientation to person, place, time  judgment and insight appear intact   I have personally reviewed the following:   Today's Data   Vitals:   04/24/21 0433 04/24/21 1325  BP: (!) 144/62 113/62  Pulse: 95 79  Resp: (!) 22 17  Temp: 99.5 F (37.5 C) 97.7 F (36.5 C)  SpO2: 95% 100%     Lab Data  CBC    Component Value Date/Time   WBC 10.0 04/23/2021 1842   RBC 4.49 04/23/2021 1842    HGB 13.7 04/23/2021 1842   HCT 40.3 04/23/2021 1842   PLT 101 (L) 04/23/2021 1842   MCV 89.8 04/23/2021 1842   MCH 30.5 04/23/2021 1842   MCHC 34.0 04/23/2021 1842   RDW 14.6 04/23/2021 1842   LYMPHSABS 0.5 (L) 04/23/2021 1842   MONOABS 0.5 04/23/2021 1842   EOSABS 0.0 04/23/2021 1842   BASOSABS 0.0 04/23/2021 1842   BMP Latest Ref Rng & Units 04/24/2021 04/23/2021 02/28/2021  Glucose 70 - 99 mg/dL 138(H) 151(H) 171(H)  BUN 8 - 23 mg/dL 30(H) 31(H) 23  Creatinine 0.61 - 1.24 mg/dL 1.86(H) 2.06(H) 1.45  BUN/Creat Ratio 10 - 24 - - -  Sodium 135 - 145 mmol/L 132(L) 132(L) 142  Potassium 3.5 - 5.1 mmol/L 3.6 3.2(L) 4.2  Chloride 98 - 111 mmol/L 99 99 103  CO2 22 - 32 mmol/L 24 24 29   Calcium 8.9 - 10.3 mg/dL 8.4(L) 8.7(L) 9.2    Micro Data  PCR for MRSA: negative  Imaging  CT right hip CXR  Cardiology Data  EKG  Scheduled Meds:  alfuzosin  10 mg Oral Q breakfast   amitriptyline  25 mg Oral QHS   aspirin EC  81 mg Oral Daily   DULoxetine  60 mg Oral Daily   insulin aspart  0-20 Units Subcutaneous Q6H   Continuous Infusions:  lactated ringers 150 mL/hr at 04/24/21 1430    Principal Problem:   Closed right hip fracture (HCC) Active Problems:   Obstructive sleep apnea   Essential hypertension   Hypokalemia   Rhabdomyolysis   Acute kidney injury superimposed on CKD ([place])   Type 2 diabetes mellitus without complication, with long-term current use of insulin ([place])   LOS: 1 day   A & P   Rhabdomyolysis Mr. Fyock laid on the floor for approximately 5 hours.  CK level is elevated.  Given 1 L of normal saline in the emergency room IV fluid hydration with LR at 150 ml/hr overnight. CK remains elevated at 1155 this morning. Change fluids to NS and continue.     Acute kidney injury superimposed on CKD Increased creatinine level from baseline.  IV fluid hydration with LR overnight. Creatinine down to 1.86 from 2.06 on admission this morning. Will continue the patient on NS  and continue to monitor creatinine, electrolytes, and volume status. In the meantime will avoid nephrotoxins and hypotension.     Type 2 diabetes mellitus without complication, with long-term current use of insulin Patient has insulin pump at home which she took off prior to coming to the hospital.  We will monitor blood sugars every 6 hours and provide sliding scale insulin as needed for glycemic control.  Patient had a hemoglobin A1c less than 2 months ago that was 6.7  so would not be repeated at this time     Obstructive sleep apnea Continue home CPAP as he uses at home.  RT to follow     Hypokalemia: Resolved. Monitor.  Hypomagnesemia: 1.5 this am. Will supplement and monitor.     DVT prophylaxis:      SCDs for DVT prophylaxis.   Code Status:              Full Code  Family Communication:       Diagnosis and plan discussed with patient.  Patient verbalized understanding agrees with plan.  Further recommendations to follow as clinically indicated Disposition Plan:              Patient is from:                        Home             Anticipated DC to:                   Home versus rehab which will be determined during hospitalization             Anticipated DC date:               Anticipate 2 midnight or more stay               Xai Frerking, DO [facility] Direct contact: see [email or web address]  7PM-7AM contact night coverage as above 04/24/2021, 3:21 PM  LOS: 1 day

## 2021-04-24 NOTE — Plan of Care (Signed)
  Problem: Coping: Goal: Level of anxiety will decrease Outcome: Progressing   Problem: Pain Managment: Goal: General experience of comfort will improve Outcome: Progressing   Problem: Safety: Goal: Ability to remain free from injury will improve Outcome: Progressing   

## 2021-04-24 NOTE — Progress Notes (Signed)
Pt placed on cpap he is tolerating it well at this time. Pt said he does wear his home cpap on regular basis. Pt was unsure about his home settings. Placed on auto Max 20 Min 5. RT will continue to monitor.

## 2021-04-24 NOTE — Plan of Care (Signed)

## 2021-04-24 NOTE — Plan of Care (Signed)
  Problem: Pain Managment: Goal: General experience of comfort will improve Outcome: Progressing   Problem: Safety: Goal: Ability to remain free from injury will improve Outcome: Progressing   

## 2021-04-24 NOTE — Consult Note (Signed)
Reason for Consult:Right hip pain Referring Physician: Hospitalist  HPI: [name] is an 80 y.o. male who was admitted through the emergency room following a fall around 1 AM after trying to pick something up that he had knocked onto the floor.  He had sudden onset of right hip pain and was not able to ambulate.  He actually laid on the floor until early in the morning when his wife found him and EMS was activated.  He initially refused transport but continued to have severe pain and was unable to stand and ultimately presented to the emergency room.  He denies any other injury.  His wife states he had been dealing with some chronic diarrhea and generalized systemic malaise a week or 2 preceding this event.  They had attributed this to his diabetes.  He was admitted to medicine and we are asked to consult.  Plain x-rays were reportedly negative and a CT scan was ordered.  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[place])    Duodenitis    Edema 05/12/2008   Esophageal stricture    GERD (gastroesophageal reflux disease)    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   Gynecomastia    Hepatic steatosis    HYPERLIPIDEMIA 05/20/2007   Hypertension    Hypogonadism male    Morbid obesity ([place]) 09/10/2009   OBSTRUCTIVE SLEEP APNEA 11/04/2007   Personal history of colonic polyps    RENAL INSUFFICIENCY 05/12/2008   Sleep apnea    uses cpap   Squamous cell carcinoma of skin 02/16/2020   left lower leg, anterior-cx3,24f   Urolithiasis     Past Surgical History:  Procedure Laterality Date   Abdominal UKorea 09/1997   arm fracture Left 1958   with hardware   Colon cancer screening     COLONOSCOPY  08/18/2004   diverticulitis   COLONOSCOPY  09/24/2009    ELECTROCARDIOGRAM  02/2006   FLEXIBLE SIGMOIDOSCOPY  03/04/2001   polyps, anal fissure   GANGLION CYST EXCISION Left 1980   Lower Arterial  04/13/2004   POLYPECTOMY     Rest Cardiolite  03/19/2003    Family History  Problem Relation Age of Onset   Colon cancer Brother 588  Colon polyps Brother    Lung cancer Brother    Other Mother        MVA, deceased 561s  Healthy Daughter    Healthy Son    Rectal cancer Neg Hx    Stomach cancer Neg Hx     Social History:  reports that he quit smoking about 38 years ago. His smoking use included cigarettes. He has a 62.00 pack-year smoking history. He has never used smokeless tobacco. He reports that he does not drink alcohol and does not use drugs.  Allergies:  Allergies  Allergen Reactions   Atorvastatin Other (See Comments)    unknown   Niacin     REACTION: Severe heartburn   Pioglitazone     REACTION: Edema    Medications: Prior to Admission:  Medications Prior to Admission  Medication Sig Dispense Refill Last Dose   alfuzosin (UROXATRAL) 10 MG 24 hr tablet TAKE 1 TABLET BY MOUTH  DAILY WITH BREAKFAST (Patient taking  differently: Take 10 mg by mouth daily with breakfast.) 90 tablet 3 04/23/2021   allopurinol (ZYLOPRIM) 300 MG tablet TAKE 1 TABLET BY MOUTH  DAILY 90 tablet 3 04/23/2021   amitriptyline (ELAVIL) 50 MG tablet Take 25 mg by mouth at bedtime.   04/22/2021   aspirin 81 MG EC tablet Take 81 mg by mouth daily.   04/23/2021   cetirizine (ZYRTEC) 10 MG tablet Take 10 mg by mouth daily as needed for allergies.   unknown   diclofenac Sodium (VOLTAREN) 1 % GEL Apply 2 g topically 4 (four) times daily. (Patient taking differently: Apply 2 g topically 2 (two) times daily as needed (pain).) 150 g 3 04/22/2021   Dulaglutide (TRULICITY) 4.5 TF/5.7DU SOPN Inject 4.5 mg as directed once a week. 6 mL 3 04/17/2021 at unknown time   DULoxetine (CYMBALTA) 60 MG capsule TAKE 1 CAPSULE BY MOUTH  DAILY 90 capsule 3 04/23/2021   fluticasone (FLONASE) 50  MCG/ACT nasal spray Place 1 spray into the nose daily as needed for allergies.    unknown   folic acid (FOLVITE) 1 MG tablet Take 1 mg by mouth daily.   04/23/2021   furosemide (LASIX) 20 MG tablet Take 20 mg by mouth See admin instructions. Take 1 tablet (20 mg) on MWF   04/22/2021   HUMALOG 100 UNIT/ML injection Inject into the skin. Insulin Pump. 300 units over 72 Hours   04/23/2021   Multiple Vitamins-Minerals (CENTRUM SILVER PO) Take 1 tablet by mouth daily.   04/23/2021   Omega-3 Fatty Acids (FISH OIL) 1200 MG CAPS Take 1,200 mg by mouth daily.   Past Week   omeprazole (PRILOSEC) 20 MG capsule Take 1 capsule (20 mg total) by mouth daily as needed. (Patient taking differently: Take 20 mg by mouth daily as needed (acid reflux).) 90 capsule 2 unknown   rosuvastatin (CRESTOR) 40 MG tablet TAKE 1 TABLET BY MOUTH AT  BEDTIME 90 tablet 3 04/22/2021   vitamin B-12 (CYANOCOBALAMIN) 1000 MCG tablet Take 1,000 mcg by mouth daily.   04/22/2021   Accu-Chek FastClix Lancets MISC USE TO CHECK BLOOD SUGAR UP TO 6 TIMES DAILY 612 each 3    amitriptyline (ELAVIL) 25 MG tablet TAKE 1 TABLET(25 MG) BY MOUTH AT BEDTIME (Patient not taking: No sig reported) 30 tablet 1 Not Taking   Blood Glucose Monitoring Suppl (ACCU-CHEK AVIVA PLUS) w/Device KIT Use as directed 1 kit 0    cetirizine (ZYRTEC) 5 MG tablet Take 1 tablet (5 mg total) by mouth daily. (Patient not taking: No sig reported)   Completed Course   Continuous Blood Gluc Sensor (DEXCOM G6 SENSOR) MISC Inject 1 Device into the skin as directed. Apply to skin SQ every 10 days 9 each 3    glucose blood (ACCU-CHEK AVIVA PLUS) test strip USE TO TEST BLOOD SUGAR 6  TIMES PER DAY; E11.9 600 each 11    Insulin Infusion Pump (T: SLIM X2 INS PMP/CONTROL 7.4) DEVI by Does not apply route.      Insulin Infusion Pump Supplies (AUTOSOFT XC INFUSION SET) MISC Inject 1 Act into the skin every other day. Use with 36m cannula and 23 inch tubing. *5 boxes (90 day supply) 5 each 3     Insulin Infusion Pump Supplies (T:SLIM INSULIN CARTRIDGE 3ML) MISC Use with insulin pump, fill once every 2 days. 5 each 3    Insulin Lispro-aabc 100 UNIT/ML SOLN For use in pump, total of 120 units each morning (Patient not taking: No sig reported) 120  mL 3 Completed Course   mometasone (ELOCON) 0.1 % lotion APPLY TOPICALLY DAILY (Patient not taking: No sig reported) 60 mL 11 Completed Course   PEG-KCl-NaCl-NaSulf-Na Asc-C (PLENVU) 140 g SOLR Take 140 g by mouth as directed. Manufacturer's coupon Universal coupon code:BIN: P2366821; GROUP: [id number]; PCN: [id number]; ID: [id number]; PAY NO MORE $50 (Patient not taking: No sig reported) 1 each 0 Completed Course    Results for orders placed or performed during the hospital encounter of 04/23/21 (from the past 48 hour(s))  Urinalysis, Routine w reflex microscopic Urine, Clean Catch     Status: Abnormal   Collection Time: 04/23/21  6:34 PM  Result Value Ref Range   Color, Urine AMBER (A) YELLOW    Comment: BIOCHEMICALS MAY BE AFFECTED BY COLOR   APPearance HAZY (A) CLEAR   Specific Gravity, Urine 1.018 1.005 - 1.030   pH 5.0 5.0 - 8.0   Glucose, UA NEGATIVE NEGATIVE mg/dL   Hgb urine dipstick NEGATIVE NEGATIVE   Bilirubin Urine NEGATIVE NEGATIVE   Ketones, ur NEGATIVE NEGATIVE mg/dL   Protein, ur 30 (A) NEGATIVE mg/dL   Nitrite NEGATIVE NEGATIVE   Leukocytes,Ua MODERATE (A) NEGATIVE   RBC / HPF 0-5 0 - 5 RBC/hpf   WBC, UA 21-50 0 - 5 WBC/hpf   Bacteria, UA NONE SEEN NONE SEEN   Squamous Epithelial / LPF 0-5 0 - 5   Mucus PRESENT    Hyaline Casts, UA PRESENT     Comment: Performed at [facility], [place] [address], [place], [place] [address]  Basic metabolic panel     Status: Abnormal   Collection Time: 04/23/21  6:42 PM  Result Value Ref Range   Sodium 132 (L) 135 - 145 mmol/L   Potassium 3.2 (L) 3.5 - 5.1 mmol/L   Chloride 99 98 - 111 mmol/L   CO2 24 22 - 32 mmol/L   Glucose, Bld 151 (H) 70 - 99 mg/dL    Comment:  Glucose reference range applies only to samples taken after fasting for at least 8 hours.   BUN 31 (H) 8 - 23 mg/dL   Creatinine, Ser 2.06 (H) 0.61 - 1.24 mg/dL   Calcium 8.7 (L) 8.9 - 10.3 mg/dL   GFR, Estimated 32 (L) >60 mL/min    Comment: (NOTE) Calculated using the CKD-EPI Creatinine Equation (2021)    Anion gap 9 5 - 15    Comment: Performed at [facility], [place] [address] [place], [place] [address]  CBC with Differential     Status: Abnormal   Collection Time: 04/23/21  6:42 PM  Result Value Ref Range   WBC 10.0 4.0 - 10.5 K/uL   RBC 4.49 4.22 - 5.81 MIL/uL   Hemoglobin 13.7 13.0 - 17.0 g/dL   HCT 40.3 39.0 - 52.0 %   MCV 89.8 80.0 - 100.0 fL   MCH 30.5 26.0 - 34.0 pg   MCHC 34.0 30.0 - 36.0 g/dL   RDW 14.6 11.5 - 15.5 %   Platelets 101 (L) 150 - 400 K/uL    Comment: PLATELET COUNT CONFIRMED BY SMEAR   nRBC 0.0 0.0 - 0.2 %   Neutrophils Relative % 89 %   Neutro Abs 8.9 (H) 1.7 - 7.7 K/uL   Lymphocytes Relative 5 %   Lymphs Abs 0.5 (L) 0.7 - 4.0 K/uL   Monocytes Relative 5 %   Monocytes Absolute 0.5 0.1 - 1.0 K/uL   Eosinophils Relative 0 %   Eosinophils Absolute 0.0 0.0 -  0.5 K/uL   Basophils Relative 0 %   Basophils Absolute 0.0 0.0 - 0.1 K/uL   WBC Morphology MILD LEFT SHIFT (1-5% METAS, OCC MYELO, OCC BANDS)    Immature Granulocytes 1 %   Abs Immature Granulocytes 0.07 0.00 - 0.07 K/uL    Comment: Performed at [facility], [place] [address], [place], [place] [address]  CK     Status: Abnormal   Collection Time: 04/23/21  6:42 PM  Result Value Ref Range   Total CK 1,208 (H) 49 - 397 U/L    Comment: Performed at [facility], [place] [address], [place], [place] [address]  CBG monitoring, ED     Status: Abnormal   Collection Time: 04/23/21  7:11 PM  Result Value Ref Range   Glucose-Capillary 152 (H) 70 - 99 mg/dL    Comment: Glucose reference range applies only to samples taken after fasting for at least 8  hours.  Resp Panel by RT-PCR (Flu A&B, Covid) Nasopharyngeal Swab     Status: None   Collection Time: 04/23/21  8:01 PM   Specimen: Nasopharyngeal Swab; Nasopharyngeal(NP) swabs in vial transport medium  Result Value Ref Range   SARS Coronavirus 2 by RT PCR NEGATIVE NEGATIVE    Comment: (NOTE) SARS-CoV-2 target nucleic acids are NOT DETECTED.  The SARS-CoV-2 RNA is generally detectable in upper respiratory specimens during the acute phase of infection. The lowest concentration of SARS-CoV-2 viral copies this assay can detect is 138 copies/mL. A negative result does not preclude SARS-Cov-2 infection and should not be used as the sole basis for treatment or other patient management decisions. A negative result may occur with  improper specimen collection/handling, submission of specimen other than nasopharyngeal swab, presence of viral mutation(s) within the areas targeted by this assay, and inadequate number of viral copies(<138 copies/mL). A negative result must be combined with clinical observations, patient history, and epidemiological information. The expected result is Negative.  Fact Sheet for Patients:  EntrepreneurPulse.com.au  Fact Sheet for Healthcare Providers:  IncredibleEmployment.be  This test is no t yet approved or cleared by the [place] FDA and  has been authorized for detection and/or diagnosis of SARS-CoV-2 by FDA under an Emergency Use Authorization (EUA). This EUA will remain  in effect (meaning this test can be used) for the duration of the COVID-19 declaration under Section 564(b)(1) of the Act, 21 U.S.C.section 360bbb-3(b)(1), unless the authorization is terminated  or revoked sooner.       Influenza A by PCR NEGATIVE NEGATIVE   Influenza B by PCR NEGATIVE NEGATIVE    Comment: (NOTE) The Xpert Xpress SARS-CoV-2/FLU/RSV plus assay is intended as an aid in the diagnosis of influenza from Nasopharyngeal swab  specimens and should not be used as a sole basis for treatment. Nasal washings and aspirates are unacceptable for Xpert Xpress SARS-CoV-2/FLU/RSV testing.  Fact Sheet for Patients: EntrepreneurPulse.com.au  Fact Sheet for Healthcare Providers: IncredibleEmployment.be  This test is not yet approved or cleared by the [place] FDA and has been authorized for detection and/or diagnosis of SARS-CoV-2 by FDA under an Emergency Use Authorization (EUA). This EUA will remain in effect (meaning this test can be used) for the duration of the COVID-19 declaration under Section 564(b)(1) of the Act, 21 U.S.C. section 360bbb-3(b)(1), unless the authorization is terminated or revoked.  Performed at [facility], [place] [address] [place], [place] [address]   Surgical pcr screen     Status: None   Collection Time:  04/23/21 11:29 PM   Specimen: Nasal Mucosa; Nasal Swab  Result Value Ref Range   MRSA, PCR NEGATIVE NEGATIVE   Staphylococcus aureus NEGATIVE NEGATIVE    Comment: (NOTE) The Xpert SA Assay (FDA approved for NASAL specimens in patients 28 years of age and older), is one component of a comprehensive surveillance program. It is not intended to diagnose infection nor to guide or monitor treatment. Performed at [facility], [place] [address] [place], [place] [address]   Glucose, capillary     Status: Abnormal   Collection Time: 04/23/21 11:36 PM  Result Value Ref Range   Glucose-Capillary 103 (H) 70 - 99 mg/dL    Comment: Glucose reference range applies only to samples taken after fasting for at least 8 hours.  Magnesium     Status: Abnormal   Collection Time: 04/24/21  3:03 AM  Result Value Ref Range   Magnesium 1.5 (L) 1.7 - 2.4 mg/dL    Comment: Performed at [facility], [place] [address] [place], [place] [address]  CK     Status: Abnormal   Collection Time: 04/24/21  3:03 AM  Result  Value Ref Range   Total CK 1,155 (H) 49 - 397 U/L    Comment: Performed at [facility], [place] [address] [place], [place] [address]  Basic metabolic panel     Status: Abnormal   Collection Time: 04/24/21  3:03 AM  Result Value Ref Range   Sodium 132 (L) 135 - 145 mmol/L   Potassium 3.6 3.5 - 5.1 mmol/L   Chloride 99 98 - 111 mmol/L   CO2 24 22 - 32 mmol/L   Glucose, Bld 138 (H) 70 - 99 mg/dL    Comment: Glucose reference range applies only to samples taken after fasting for at least 8 hours.   BUN 30 (H) 8 - 23 mg/dL   Creatinine, Ser 1.86 (H) 0.61 - 1.24 mg/dL   Calcium 8.4 (L) 8.9 - 10.3 mg/dL   GFR, Estimated 36 (L) >60 mL/min    Comment: (NOTE) Calculated using the CKD-EPI Creatinine Equation (2021)    Anion gap 9 5 - 15    Comment: Performed at [facility], [place] [address] [place], [place] [address]  Glucose, capillary     Status: Abnormal   Collection Time: 04/24/21  6:44 AM  Result Value Ref Range   Glucose-Capillary 167 (H) 70 - 99 mg/dL    Comment: Glucose reference range applies only to samples taken after fasting for at least 8 hours.    CT Hip Right Wo Contrast  Result Date: 04/23/2021 CLINICAL DATA:  Hip trauma, fracture suspected, neg xray Fall last night with right hip pain. EXAM: CT OF THE RIGHT HIP WITHOUT CONTRAST TECHNIQUE: Multidetector CT imaging of the right hip was performed according to the standard protocol. Multiplanar CT image reconstructions were also generated. COMPARISON:  Radiograph earlier today. FINDINGS: Bones/Joint/Cartilage Slight cortical step-off of the femoral head neck junction, best appreciated on coronal series 7, images 35-37, suspicious for incomplete nondisplaced fracture. Femoral head is well seated in the acetabulum. No acetabular or pubic rami fractures. There is mild acetabular spurring. Ligaments Suboptimally assessed by CT. Muscles and Tendons No confluent muscle hematoma or muscle atrophy. There is  an intramuscular lipoma within the obturator external muscle. Soft tissues Prominent prostate gland. IMPRESSION: Slight cortical step-off of the lateral femoral head neck junction, suspicious for incomplete nondisplaced femoral neck fracture. Electronically Signed   By: Threasa Beards  [place] M.D.   On: 04/23/2021 20:50   DG Chest Port 1 View  Result Date: 04/23/2021 CLINICAL DATA:  Acute pain following fall. EXAM: PORTABLE CHEST 1 VIEW COMPARISON:  None. FINDINGS: Increased density overlying the UPPER LEFT lung may be related to the end of the LEFT 1st rib. The cardiomediastinal silhouette is unremarkable. There is no evidence of focal airspace disease, pulmonary edema, pleural effusion, or pneumothorax. No acute bony abnormalities are identified. IMPRESSION: Increased density overlying the UPPER LEFT lung which may be related to the end of the LEFT 1st rib. Recommend PA and lateral chest x-ray for further evaluation. No other significant abnormality. Electronically Signed   By: Margarette Canada M.D.   On: 04/23/2021 19:53   DG Hip Unilat W or Wo Pelvis 2-3 Views Right  Result Date: 04/23/2021 CLINICAL DATA:  Acute RIGHT hip pain following fall yesterday. Initial encounter. EXAM: DG HIP (WITH OR WITHOUT PELVIS) 2-3V RIGHT COMPARISON:  None. FINDINGS: There is no evidence of hip fracture or dislocation. There is no evidence of arthropathy or other focal bone abnormality. IMPRESSION: Negative. Electronically Signed   By: Margarette Canada M.D.   On: 04/23/2021 19:50    ROS: as per HPI   Physical Exam: [name] is alert and oriented and evaluated in his bed.  His wife is present today.  At rest he denies any particular discomfort.  On clinical examination there is a subtle increased vascularity of along the lateral gluteal area that could represent a early bruise but certainly no obvious swelling bruising or ecchymosis is noted.  He is neurovascularly intact distally.  He tolerates logroll which mostly refers pain to his  knee.  Hip flexion does cause some lateral hip pain as well as pain into his knee.  Moderate discomfort also noted with hip internal and external rotation.  His radiographs as well as CT scan are reviewed by myself.  This shows a very subtle unicortical irregularity but certainly no obvious complete fracture or displacement. Vitals Temp:  [98.3 F (36.8 C)-99.5 F (37.5 C)] 99.5 F (37.5 C) (07/03 0433) Pulse Rate:  [74-103] 95 (07/03 0433) Resp:  [15-22] 22 (07/03 0433) BP: (109-173)/(60-74) 144/62 (07/03 0433) SpO2:  [91 %-100 %] 95 % (07/03 0433) Weight:  [125.1 kg] 125.1 kg (07/02 1834) Body mass index is 37.4 kg/m.  Assessment/Plan: Impression: Severe right hip pain with weightbearing following mechanical fall with CT scan demonstrating a unicortical irregularity about the femoral neck laterally.  Treatment: We will allow him to eat today, our hip specialist Dr. Lyla Glassing will be reviewing his films and discussing further treatment options with he and his wife.  In the meantime he is to be Strict nonweightbearing.  He is okay for some gentle bed mobility.  Continue as needed analgesics and ice.  I will go ahead and make him n.p.o. after midnight in case Dr. Lyla Glassing would like to proceed with any kind of surgical management to include possibility of hip pinning.  Olivia Mackie Kaesyn Johnston  PA-C 04/24/2021, 10:27 AM  Contact # [phone]

## 2021-04-24 NOTE — Anesthesia Preprocedure Evaluation (Addendum)
Anesthesia Evaluation  Patient identified by MRN, date of birth, ID band Patient awake    Reviewed: Allergy & Precautions, NPO status , Patient's Chart, lab work & pertinent test results  Airway Mallampati: III  TM Distance: >3 FB Neck ROM: Full   Comment: Large neck circumference Dental  (+) Edentulous Upper   Pulmonary sleep apnea and Continuous Positive Airway Pressure Ventilation , former smoker,    Pulmonary exam normal breath sounds clear to auscultation       Cardiovascular hypertension, + CAD  Normal cardiovascular exam Rhythm:Regular Rate:Normal  Normal sinus rhythm Rightward axis Nonspecific ST abnormality Abnormal ECG Confirmed by Dene Gentry [phone]) on 04/23/2021 8:05:26 PM   Neuro/Psych  Neuromuscular disease (diabetic peripheral neuropathy) negative psych ROS   GI/Hepatic GERD  ,  Endo/Other  diabetes, Type 1, Insulin DependentObesity BMI 37  Renal/GU ARF, CRF and Renal InsufficiencyRenal disease  negative genitourinary   Musculoskeletal  (+) Arthritis , Rhabdomyolysis with CK>1100   Abdominal (+) + obese,   Peds  Hematology  (+) anemia ,   Anesthesia Other Findings   Reproductive/Obstetrics negative OB ROS                            Anesthesia Physical Anesthesia Plan  ASA: 4  Anesthesia Plan: General   Post-op Pain Management:    Induction: Intravenous  PONV Risk Score and Plan: 2 and Treatment may vary due to age or medical condition and Ondansetron  Airway Management Planned: Oral ETT and Video Laryngoscope Planned  Additional Equipment: None  Intra-op Plan:   Post-operative Plan: Extubation in OR  Informed Consent: I have reviewed the patients History and Physical, chart, labs and discussed the procedure including the risks, benefits and alternatives for the proposed anesthesia with the patient or authorized representative who has indicated his/her  understanding and acceptance.     Dental advisory given  Plan Discussed with: CRNA and Anesthesiologist  Anesthesia Plan Comments: (Will plan for GETA to expedite patient's mobility given he currently has rhabdomyolysis. Would like to see CK trending downward. Will continue fluid resuscitation. K+ normal. )       Anesthesia Quick Evaluation

## 2021-04-24 NOTE — H&P (View-Only) (Signed)
Reason for Consult:Right hip pain Referring Physician: Hospitalist  HPI: [name] is an 80 y.o. male who was admitted through the emergency room following a fall around 1 AM after trying to pick something up that he had knocked onto the floor.  He had sudden onset of right hip pain and was not able to ambulate.  He actually laid on the floor until early in the morning when his wife found him and EMS was activated.  He initially refused transport but continued to have severe pain and was unable to stand and ultimately presented to the emergency room.  He denies any other injury.  His wife states he had been dealing with some chronic diarrhea and generalized systemic malaise a week or 2 preceding this event.  They had attributed this to his diabetes.  He was admitted to medicine and we are asked to consult.  Plain x-rays were reportedly negative and a CT scan was ordered.  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[place])    Duodenitis    Edema 05/12/2008   Esophageal stricture    GERD (gastroesophageal reflux disease)    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   Gynecomastia    Hepatic steatosis    HYPERLIPIDEMIA 05/20/2007   Hypertension    Hypogonadism male    Morbid obesity ([place]) 09/10/2009   OBSTRUCTIVE SLEEP APNEA 11/04/2007   Personal history of colonic polyps    RENAL INSUFFICIENCY 05/12/2008   Sleep apnea    uses cpap   Squamous cell carcinoma of skin 02/16/2020   left lower leg, anterior-cx3,24f   Urolithiasis     Past Surgical History:  Procedure Laterality Date   Abdominal UKorea 09/1997   arm fracture Left 1958   with hardware   Colon cancer screening     COLONOSCOPY  08/18/2004   diverticulitis   COLONOSCOPY  09/24/2009    ELECTROCARDIOGRAM  02/2006   FLEXIBLE SIGMOIDOSCOPY  03/04/2001   polyps, anal fissure   GANGLION CYST EXCISION Left 1980   Lower Arterial  04/13/2004   POLYPECTOMY     Rest Cardiolite  03/19/2003    Family History  Problem Relation Age of Onset   Colon cancer Brother 588  Colon polyps Brother    Lung cancer Brother    Other Mother        MVA, deceased 561s  Healthy Daughter    Healthy Son    Rectal cancer Neg Hx    Stomach cancer Neg Hx     Social History:  reports that he quit smoking about 38 years ago. His smoking use included cigarettes. He has a 62.00 pack-year smoking history. He has never used smokeless tobacco. He reports that he does not drink alcohol and does not use drugs.  Allergies:  Allergies  Allergen Reactions   Atorvastatin Other (See Comments)    unknown   Niacin     REACTION: Severe heartburn   Pioglitazone     REACTION: Edema    Medications: Prior to Admission:  Medications Prior to Admission  Medication Sig Dispense Refill Last Dose   alfuzosin (UROXATRAL) 10 MG 24 hr tablet TAKE 1 TABLET BY MOUTH  DAILY WITH BREAKFAST (Patient taking  differently: Take 10 mg by mouth daily with breakfast.) 90 tablet 3 04/23/2021   allopurinol (ZYLOPRIM) 300 MG tablet TAKE 1 TABLET BY MOUTH  DAILY 90 tablet 3 04/23/2021   amitriptyline (ELAVIL) 50 MG tablet Take 25 mg by mouth at bedtime.   04/22/2021   aspirin 81 MG EC tablet Take 81 mg by mouth daily.   04/23/2021   cetirizine (ZYRTEC) 10 MG tablet Take 10 mg by mouth daily as needed for allergies.   unknown   diclofenac Sodium (VOLTAREN) 1 % GEL Apply 2 g topically 4 (four) times daily. (Patient taking differently: Apply 2 g topically 2 (two) times daily as needed (pain).) 150 g 3 04/22/2021   Dulaglutide (TRULICITY) 4.5 TF/5.7DU SOPN Inject 4.5 mg as directed once a week. 6 mL 3 04/17/2021 at unknown time   DULoxetine (CYMBALTA) 60 MG capsule TAKE 1 CAPSULE BY MOUTH  DAILY 90 capsule 3 04/23/2021   fluticasone (FLONASE) 50  MCG/ACT nasal spray Place 1 spray into the nose daily as needed for allergies.    unknown   folic acid (FOLVITE) 1 MG tablet Take 1 mg by mouth daily.   04/23/2021   furosemide (LASIX) 20 MG tablet Take 20 mg by mouth See admin instructions. Take 1 tablet (20 mg) on MWF   04/22/2021   HUMALOG 100 UNIT/ML injection Inject into the skin. Insulin Pump. 300 units over 72 Hours   04/23/2021   Multiple Vitamins-Minerals (CENTRUM SILVER PO) Take 1 tablet by mouth daily.   04/23/2021   Omega-3 Fatty Acids (FISH OIL) 1200 MG CAPS Take 1,200 mg by mouth daily.   Past Week   omeprazole (PRILOSEC) 20 MG capsule Take 1 capsule (20 mg total) by mouth daily as needed. (Patient taking differently: Take 20 mg by mouth daily as needed (acid reflux).) 90 capsule 2 unknown   rosuvastatin (CRESTOR) 40 MG tablet TAKE 1 TABLET BY MOUTH AT  BEDTIME 90 tablet 3 04/22/2021   vitamin B-12 (CYANOCOBALAMIN) 1000 MCG tablet Take 1,000 mcg by mouth daily.   04/22/2021   Accu-Chek FastClix Lancets MISC USE TO CHECK BLOOD SUGAR UP TO 6 TIMES DAILY 612 each 3    amitriptyline (ELAVIL) 25 MG tablet TAKE 1 TABLET(25 MG) BY MOUTH AT BEDTIME (Patient not taking: No sig reported) 30 tablet 1 Not Taking   Blood Glucose Monitoring Suppl (ACCU-CHEK AVIVA PLUS) w/Device KIT Use as directed 1 kit 0    cetirizine (ZYRTEC) 5 MG tablet Take 1 tablet (5 mg total) by mouth daily. (Patient not taking: No sig reported)   Completed Course   Continuous Blood Gluc Sensor (DEXCOM G6 SENSOR) MISC Inject 1 Device into the skin as directed. Apply to skin SQ every 10 days 9 each 3    glucose blood (ACCU-CHEK AVIVA PLUS) test strip USE TO TEST BLOOD SUGAR 6  TIMES PER DAY; E11.9 600 each 11    Insulin Infusion Pump (T: SLIM X2 INS PMP/CONTROL 7.4) DEVI by Does not apply route.      Insulin Infusion Pump Supplies (AUTOSOFT XC INFUSION SET) MISC Inject 1 Act into the skin every other day. Use with 36m cannula and 23 inch tubing. *5 boxes (90 day supply) 5 each 3     Insulin Infusion Pump Supplies (T:SLIM INSULIN CARTRIDGE 3ML) MISC Use with insulin pump, fill once every 2 days. 5 each 3    Insulin Lispro-aabc 100 UNIT/ML SOLN For use in pump, total of 120 units each morning (Patient not taking: No sig reported) 120  mL 3 Completed Course   mometasone (ELOCON) 0.1 % lotion APPLY TOPICALLY DAILY (Patient not taking: No sig reported) 60 mL 11 Completed Course   PEG-KCl-NaCl-NaSulf-Na Asc-C (PLENVU) 140 g SOLR Take 140 g by mouth as directed. Manufacturer's coupon Universal coupon code:BIN: P2366821; GROUP: [id number]; PCN: [id number]; ID: [id number]; PAY NO MORE $50 (Patient not taking: No sig reported) 1 each 0 Completed Course    Results for orders placed or performed during the hospital encounter of 04/23/21 (from the past 48 hour(s))  Urinalysis, Routine w reflex microscopic Urine, Clean Catch     Status: Abnormal   Collection Time: 04/23/21  6:34 PM  Result Value Ref Range   Color, Urine AMBER (A) YELLOW    Comment: BIOCHEMICALS MAY BE AFFECTED BY COLOR   APPearance HAZY (A) CLEAR   Specific Gravity, Urine 1.018 1.005 - 1.030   pH 5.0 5.0 - 8.0   Glucose, UA NEGATIVE NEGATIVE mg/dL   Hgb urine dipstick NEGATIVE NEGATIVE   Bilirubin Urine NEGATIVE NEGATIVE   Ketones, ur NEGATIVE NEGATIVE mg/dL   Protein, ur 30 (A) NEGATIVE mg/dL   Nitrite NEGATIVE NEGATIVE   Leukocytes,Ua MODERATE (A) NEGATIVE   RBC / HPF 0-5 0 - 5 RBC/hpf   WBC, UA 21-50 0 - 5 WBC/hpf   Bacteria, UA NONE SEEN NONE SEEN   Squamous Epithelial / LPF 0-5 0 - 5   Mucus PRESENT    Hyaline Casts, UA PRESENT     Comment: Performed at [facility], [place] [address], [place], [place] [address]  Basic metabolic panel     Status: Abnormal   Collection Time: 04/23/21  6:42 PM  Result Value Ref Range   Sodium 132 (L) 135 - 145 mmol/L   Potassium 3.2 (L) 3.5 - 5.1 mmol/L   Chloride 99 98 - 111 mmol/L   CO2 24 22 - 32 mmol/L   Glucose, Bld 151 (H) 70 - 99 mg/dL    Comment:  Glucose reference range applies only to samples taken after fasting for at least 8 hours.   BUN 31 (H) 8 - 23 mg/dL   Creatinine, Ser 2.06 (H) 0.61 - 1.24 mg/dL   Calcium 8.7 (L) 8.9 - 10.3 mg/dL   GFR, Estimated 32 (L) >60 mL/min    Comment: (NOTE) Calculated using the CKD-EPI Creatinine Equation (2021)    Anion gap 9 5 - 15    Comment: Performed at [facility], [place] [address] [place], [place] [address]  CBC with Differential     Status: Abnormal   Collection Time: 04/23/21  6:42 PM  Result Value Ref Range   WBC 10.0 4.0 - 10.5 K/uL   RBC 4.49 4.22 - 5.81 MIL/uL   Hemoglobin 13.7 13.0 - 17.0 g/dL   HCT 40.3 39.0 - 52.0 %   MCV 89.8 80.0 - 100.0 fL   MCH 30.5 26.0 - 34.0 pg   MCHC 34.0 30.0 - 36.0 g/dL   RDW 14.6 11.5 - 15.5 %   Platelets 101 (L) 150 - 400 K/uL    Comment: PLATELET COUNT CONFIRMED BY SMEAR   nRBC 0.0 0.0 - 0.2 %   Neutrophils Relative % 89 %   Neutro Abs 8.9 (H) 1.7 - 7.7 K/uL   Lymphocytes Relative 5 %   Lymphs Abs 0.5 (L) 0.7 - 4.0 K/uL   Monocytes Relative 5 %   Monocytes Absolute 0.5 0.1 - 1.0 K/uL   Eosinophils Relative 0 %   Eosinophils Absolute 0.0 0.0 -  0.5 K/uL   Basophils Relative 0 %   Basophils Absolute 0.0 0.0 - 0.1 K/uL   WBC Morphology MILD LEFT SHIFT (1-5% METAS, OCC MYELO, OCC BANDS)    Immature Granulocytes 1 %   Abs Immature Granulocytes 0.07 0.00 - 0.07 K/uL    Comment: Performed at [facility], [place] [address], [place], [place] [address]  CK     Status: Abnormal   Collection Time: 04/23/21  6:42 PM  Result Value Ref Range   Total CK 1,208 (H) 49 - 397 U/L    Comment: Performed at [facility], [place] [address], [place], [place] [address]  CBG monitoring, ED     Status: Abnormal   Collection Time: 04/23/21  7:11 PM  Result Value Ref Range   Glucose-Capillary 152 (H) 70 - 99 mg/dL    Comment: Glucose reference range applies only to samples taken after fasting for at least 8  hours.  Resp Panel by RT-PCR (Flu A&B, Covid) Nasopharyngeal Swab     Status: None   Collection Time: 04/23/21  8:01 PM   Specimen: Nasopharyngeal Swab; Nasopharyngeal(NP) swabs in vial transport medium  Result Value Ref Range   SARS Coronavirus 2 by RT PCR NEGATIVE NEGATIVE    Comment: (NOTE) SARS-CoV-2 target nucleic acids are NOT DETECTED.  The SARS-CoV-2 RNA is generally detectable in upper respiratory specimens during the acute phase of infection. The lowest concentration of SARS-CoV-2 viral copies this assay can detect is 138 copies/mL. A negative result does not preclude SARS-Cov-2 infection and should not be used as the sole basis for treatment or other patient management decisions. A negative result may occur with  improper specimen collection/handling, submission of specimen other than nasopharyngeal swab, presence of viral mutation(s) within the areas targeted by this assay, and inadequate number of viral copies(<138 copies/mL). A negative result must be combined with clinical observations, patient history, and epidemiological information. The expected result is Negative.  Fact Sheet for Patients:  EntrepreneurPulse.com.au  Fact Sheet for Healthcare Providers:  IncredibleEmployment.be  This test is no t yet approved or cleared by the [place] FDA and  has been authorized for detection and/or diagnosis of SARS-CoV-2 by FDA under an Emergency Use Authorization (EUA). This EUA will remain  in effect (meaning this test can be used) for the duration of the COVID-19 declaration under Section 564(b)(1) of the Act, 21 U.S.C.section 360bbb-3(b)(1), unless the authorization is terminated  or revoked sooner.       Influenza A by PCR NEGATIVE NEGATIVE   Influenza B by PCR NEGATIVE NEGATIVE    Comment: (NOTE) The Xpert Xpress SARS-CoV-2/FLU/RSV plus assay is intended as an aid in the diagnosis of influenza from Nasopharyngeal swab  specimens and should not be used as a sole basis for treatment. Nasal washings and aspirates are unacceptable for Xpert Xpress SARS-CoV-2/FLU/RSV testing.  Fact Sheet for Patients: EntrepreneurPulse.com.au  Fact Sheet for Healthcare Providers: IncredibleEmployment.be  This test is not yet approved or cleared by the [place] FDA and has been authorized for detection and/or diagnosis of SARS-CoV-2 by FDA under an Emergency Use Authorization (EUA). This EUA will remain in effect (meaning this test can be used) for the duration of the COVID-19 declaration under Section 564(b)(1) of the Act, 21 U.S.C. section 360bbb-3(b)(1), unless the authorization is terminated or revoked.  Performed at [facility], [place] [address] [place], [place] [address]   Surgical pcr screen     Status: None   Collection Time:  04/23/21 11:29 PM   Specimen: Nasal Mucosa; Nasal Swab  Result Value Ref Range   MRSA, PCR NEGATIVE NEGATIVE   Staphylococcus aureus NEGATIVE NEGATIVE    Comment: (NOTE) The Xpert SA Assay (FDA approved for NASAL specimens in patients 28 years of age and older), is one component of a comprehensive surveillance program. It is not intended to diagnose infection nor to guide or monitor treatment. Performed at [facility], [place] [address] [place], [place] [address]   Glucose, capillary     Status: Abnormal   Collection Time: 04/23/21 11:36 PM  Result Value Ref Range   Glucose-Capillary 103 (H) 70 - 99 mg/dL    Comment: Glucose reference range applies only to samples taken after fasting for at least 8 hours.  Magnesium     Status: Abnormal   Collection Time: 04/24/21  3:03 AM  Result Value Ref Range   Magnesium 1.5 (L) 1.7 - 2.4 mg/dL    Comment: Performed at [facility], [place] [address] [place], [place] [address]  CK     Status: Abnormal   Collection Time: 04/24/21  3:03 AM  Result  Value Ref Range   Total CK 1,155 (H) 49 - 397 U/L    Comment: Performed at [facility], [place] [address] [place], [place] [address]  Basic metabolic panel     Status: Abnormal   Collection Time: 04/24/21  3:03 AM  Result Value Ref Range   Sodium 132 (L) 135 - 145 mmol/L   Potassium 3.6 3.5 - 5.1 mmol/L   Chloride 99 98 - 111 mmol/L   CO2 24 22 - 32 mmol/L   Glucose, Bld 138 (H) 70 - 99 mg/dL    Comment: Glucose reference range applies only to samples taken after fasting for at least 8 hours.   BUN 30 (H) 8 - 23 mg/dL   Creatinine, Ser 1.86 (H) 0.61 - 1.24 mg/dL   Calcium 8.4 (L) 8.9 - 10.3 mg/dL   GFR, Estimated 36 (L) >60 mL/min    Comment: (NOTE) Calculated using the CKD-EPI Creatinine Equation (2021)    Anion gap 9 5 - 15    Comment: Performed at [facility], [place] [address] [place], [place] [address]  Glucose, capillary     Status: Abnormal   Collection Time: 04/24/21  6:44 AM  Result Value Ref Range   Glucose-Capillary 167 (H) 70 - 99 mg/dL    Comment: Glucose reference range applies only to samples taken after fasting for at least 8 hours.    CT Hip Right Wo Contrast  Result Date: 04/23/2021 CLINICAL DATA:  Hip trauma, fracture suspected, neg xray Fall last night with right hip pain. EXAM: CT OF THE RIGHT HIP WITHOUT CONTRAST TECHNIQUE: Multidetector CT imaging of the right hip was performed according to the standard protocol. Multiplanar CT image reconstructions were also generated. COMPARISON:  Radiograph earlier today. FINDINGS: Bones/Joint/Cartilage Slight cortical step-off of the femoral head neck junction, best appreciated on coronal series 7, images 35-37, suspicious for incomplete nondisplaced fracture. Femoral head is well seated in the acetabulum. No acetabular or pubic rami fractures. There is mild acetabular spurring. Ligaments Suboptimally assessed by CT. Muscles and Tendons No confluent muscle hematoma or muscle atrophy. There is  an intramuscular lipoma within the obturator external muscle. Soft tissues Prominent prostate gland. IMPRESSION: Slight cortical step-off of the lateral femoral head neck junction, suspicious for incomplete nondisplaced femoral neck fracture. Electronically Signed   By: Threasa Beards  San[place] M.D.   On: 04/23/2021 20:50   DG Chest Port 1 View  Result Date: 04/23/2021 CLINICAL DATA:  Acute pain following fall. EXAM: PORTABLE CHEST 1 VIEW COMPARISON:  None. FINDINGS: Increased density overlying the UPPER LEFT lung may be related to the end of the LEFT 1st rib. The cardiomediastinal silhouette is unremarkable. There is no evidence of focal airspace disease, pulmonary edema, pleural effusion, or pneumothorax. No acute bony abnormalities are identified. IMPRESSION: Increased density overlying the UPPER LEFT lung which may be related to the end of the LEFT 1st rib. Recommend PA and lateral chest x-ray for further evaluation. No other significant abnormality. Electronically Signed   By: Margarette Canada M.D.   On: 04/23/2021 19:53   DG Hip Unilat W or Wo Pelvis 2-3 Views Right  Result Date: 04/23/2021 CLINICAL DATA:  Acute RIGHT hip pain following fall yesterday. Initial encounter. EXAM: DG HIP (WITH OR WITHOUT PELVIS) 2-3V RIGHT COMPARISON:  None. FINDINGS: There is no evidence of hip fracture or dislocation. There is no evidence of arthropathy or other focal bone abnormality. IMPRESSION: Negative. Electronically Signed   By: Margarette Canada M.D.   On: 04/23/2021 19:50    ROS: as per HPI   Physical Exam: Mr. Huezo is alert and oriented and evaluated in his bed.  His wife is present today.  At rest he denies any particular discomfort.  On clinical examination there is a subtle increased vascularity of along the lateral gluteal area that could represent a early bruise but certainly no obvious swelling bruising or ecchymosis is noted.  He is neurovascularly intact distally.  He tolerates logroll which mostly refers pain to his  knee.  Hip flexion does cause some lateral hip pain as well as pain into his knee.  Moderate discomfort also noted with hip internal and external rotation.  His radiographs as well as CT scan are reviewed by myself.  This shows a very subtle unicortical irregularity but certainly no obvious complete fracture or displacement. Vitals Temp:  [98.3 F (36.8 C)-99.5 F (37.5 C)] 99.5 F (37.5 C) (07/03 0433) Pulse Rate:  [74-103] 95 (07/03 0433) Resp:  [15-22] 22 (07/03 0433) BP: (109-173)/(60-74) 144/62 (07/03 0433) SpO2:  [91 %-100 %] 95 % (07/03 0433) Weight:  [125.1 kg] 125.1 kg (07/02 1834) Body mass index is 37.4 kg/m.  Assessment/Plan: Impression: Severe right hip pain with weightbearing following mechanical fall with CT scan demonstrating a unicortical irregularity about the femoral neck laterally.  Treatment: We will allow him to eat today, our hip specialist Dr. Lyla Glassing will be reviewing his films and discussing further treatment options with he and his wife.  In the meantime he is to be Strict nonweightbearing.  He is okay for some gentle bed mobility.  Continue as needed analgesics and ice.  I will go ahead and make him n.p.o. after midnight in case Dr. Lyla Glassing would like to proceed with any kind of surgical management to include possibility of hip pinning.  Olivia Mackie Reham Slabaugh  PA-C 04/24/2021, 10:27 AM  Contact # [phone]

## 2021-04-24 NOTE — Progress Notes (Signed)
Rapid response nurse made aware of patient's status subsequent to previous note. On call provider responded to page and orders placed. Same carried out. MEWS protocol in effect and patient being monitored closely while interventions take effect.

## 2021-04-25 ENCOUNTER — Encounter (HOSPITAL_COMMUNITY)
Admission: EM | Disposition: A | Discharge status: Skilled Nursing Facility | Payer: Self-pay | Source: Home / Self Care | Attending: Internal Medicine

## 2021-04-25 ENCOUNTER — Inpatient Hospital Stay (HOSPITAL_COMMUNITY): Payer: Medicare Other

## 2021-04-25 ENCOUNTER — Inpatient Hospital Stay (HOSPITAL_COMMUNITY): Payer: Medicare Other | Admitting: Certified Registered Nurse Anesthetist

## 2021-04-25 HISTORY — PX: HIP PINNING,CANNULATED: SHX1758

## 2021-04-25 LAB — CBC WITH DIFFERENTIAL/PLATELET
Abs Immature Granulocytes: 0.05 10*3/uL (ref 0.00–0.07)
Basophils Absolute: 0 10*3/uL (ref 0.0–0.1)
Basophils Relative: 0 %
Eosinophils Absolute: 0 10*3/uL (ref 0.0–0.5)
Eosinophils Relative: 0 %
HCT: 35.3 % — ABNORMAL LOW (ref 39.0–52.0)
Hemoglobin: 11.9 g/dL — ABNORMAL LOW (ref 13.0–17.0)
Immature Granulocytes: 1 %
Lymphocytes Relative: 11 %
Lymphs Abs: 0.7 10*3/uL (ref 0.7–4.0)
MCH: 30.4 pg (ref 26.0–34.0)
MCHC: 33.7 g/dL (ref 30.0–36.0)
MCV: 90.1 fL (ref 80.0–100.0)
Monocytes Absolute: 0.4 10*3/uL (ref 0.1–1.0)
Monocytes Relative: 6 %
Neutro Abs: 5.7 10*3/uL (ref 1.7–7.7)
Neutrophils Relative %: 82 %
Platelets: 86 10*3/uL — ABNORMAL LOW (ref 150–400)
RBC: 3.92 MIL/uL — ABNORMAL LOW (ref 4.22–5.81)
RDW: 14.6 % (ref 11.5–15.5)
WBC: 7 10*3/uL (ref 4.0–10.5)
nRBC: 0 % (ref 0.0–0.2)

## 2021-04-25 LAB — BASIC METABOLIC PANEL
Anion gap: 11 (ref 5–15)
BUN: 31 mg/dL — ABNORMAL HIGH (ref 8–23)
CO2: 21 mmol/L — ABNORMAL LOW (ref 22–32)
Calcium: 8.4 mg/dL — ABNORMAL LOW (ref 8.9–10.3)
Chloride: 102 mmol/L (ref 98–111)
Creatinine, Ser: 1.51 mg/dL — ABNORMAL HIGH (ref 0.61–1.24)
GFR, Estimated: 47 mL/min — ABNORMAL LOW (ref >60)
Glucose, Bld: 202 mg/dL — ABNORMAL HIGH (ref 70–99)
Potassium: 3.3 mmol/L — ABNORMAL LOW (ref 3.5–5.1)
Sodium: 134 mmol/L — ABNORMAL LOW (ref 135–145)

## 2021-04-25 LAB — GLUCOSE, CAPILLARY
Glucose-Capillary: 188 mg/dL — ABNORMAL HIGH (ref 70–99)
Glucose-Capillary: 192 mg/dL — ABNORMAL HIGH (ref 70–99)
Glucose-Capillary: 194 mg/dL — ABNORMAL HIGH (ref 70–99)
Glucose-Capillary: 221 mg/dL — ABNORMAL HIGH (ref 70–99)
Glucose-Capillary: 240 mg/dL — ABNORMAL HIGH (ref 70–99)
Glucose-Capillary: 253 mg/dL — ABNORMAL HIGH (ref 70–99)

## 2021-04-25 SURGERY — FIXATION, FEMUR, NECK, PERCUTANEOUS, USING SCREW
Anesthesia: General | Site: Hip | Laterality: Right

## 2021-04-25 MED ORDER — FENTANYL CITRATE (PF) 100 MCG/2ML IJ SOLN
INTRAMUSCULAR | Status: AC
  Filled 2021-04-25: qty 2

## 2021-04-25 MED ORDER — ROCURONIUM BROMIDE 10 MG/ML (PF) SYRINGE
PREFILLED_SYRINGE | INTRAVENOUS | Status: AC
  Filled 2021-04-25: qty 10

## 2021-04-25 MED ORDER — LACTATED RINGERS IV SOLN: INTRAVENOUS | Status: DC | PRN

## 2021-04-25 MED ORDER — METHOCARBAMOL 500 MG PO TABS
500.0000 mg | ORAL_TABLET | Freq: Four times a day (QID) | ORAL | Status: DC | PRN
  Administered 2021-04-25 – 2021-04-28 (×4): 500 mg via ORAL
  Filled 2021-04-25 (×5): qty 1

## 2021-04-25 MED ORDER — ISOPROPYL ALCOHOL 70 % SOLN
Status: AC
  Filled 2021-04-25: qty 480

## 2021-04-25 MED ORDER — MENTHOL 3 MG MT LOZG: 1.0000 | LOZENGE | OROMUCOSAL | Status: DC | PRN

## 2021-04-25 MED ORDER — LIDOCAINE 2% (20 MG/ML) 5 ML SYRINGE
INTRAMUSCULAR | Status: DC | PRN
  Administered 2021-04-25: 80 mg via INTRAVENOUS

## 2021-04-25 MED ORDER — METOCLOPRAMIDE HCL 5 MG/ML IJ SOLN: 5.0000 mg | Freq: Three times a day (TID) | INTRAMUSCULAR | Status: DC | PRN

## 2021-04-25 MED ORDER — CEFAZOLIN SODIUM-DEXTROSE 2-4 GM/100ML-% IV SOLN
2.0000 g | Freq: Four times a day (QID) | INTRAVENOUS | Status: AC
  Administered 2021-04-25 (×2): 2 g via INTRAVENOUS
  Filled 2021-04-25 (×2): qty 100

## 2021-04-25 MED ORDER — ENSURE SURGERY PO LIQD
237.0000 mL | Freq: Two times a day (BID) | ORAL | Status: DC
  Administered 2021-04-25 – 2021-04-27 (×6): 237 mL via ORAL

## 2021-04-25 MED ORDER — ONDANSETRON HCL 4 MG/2ML IJ SOLN
INTRAMUSCULAR | Status: DC | PRN
  Administered 2021-04-25: 4 mg via INTRAVENOUS

## 2021-04-25 MED ORDER — METHOCARBAMOL 500 MG IVPB - SIMPLE MED
500.0000 mg | Freq: Four times a day (QID) | INTRAVENOUS | Status: DC | PRN
  Administered 2021-04-25: 500 mg via INTRAVENOUS
  Filled 2021-04-25: qty 500

## 2021-04-25 MED ORDER — PHENOL 1.4 % MT LIQD: 1.0000 | OROMUCOSAL | Status: DC | PRN

## 2021-04-25 MED ORDER — SENNA 8.6 MG PO TABS
1.0000 | ORAL_TABLET | Freq: Two times a day (BID) | ORAL | Status: DC
  Administered 2021-04-25 – 2021-04-27 (×5): 8.6 mg via ORAL
  Filled 2021-04-25 (×5): qty 1

## 2021-04-25 MED ORDER — ONDANSETRON HCL 4 MG/2ML IJ SOLN
INTRAMUSCULAR | Status: AC
  Filled 2021-04-25: qty 2

## 2021-04-25 MED ORDER — ROCURONIUM BROMIDE 10 MG/ML (PF) SYRINGE
PREFILLED_SYRINGE | INTRAVENOUS | Status: DC | PRN
  Administered 2021-04-25: 70 mg via INTRAVENOUS

## 2021-04-25 MED ORDER — ONDANSETRON HCL 4 MG/2ML IJ SOLN: 4.0000 mg | Freq: Once | INTRAMUSCULAR | Status: DC | PRN

## 2021-04-25 MED ORDER — LIDOCAINE 2% (20 MG/ML) 5 ML SYRINGE
INTRAMUSCULAR | Status: AC
  Filled 2021-04-25: qty 5

## 2021-04-25 MED ORDER — DEXAMETHASONE SODIUM PHOSPHATE 10 MG/ML IJ SOLN
INTRAMUSCULAR | Status: AC
  Filled 2021-04-25: qty 1

## 2021-04-25 MED ORDER — FENTANYL CITRATE (PF) 100 MCG/2ML IJ SOLN: 25.0000 ug | INTRAMUSCULAR | Status: DC | PRN

## 2021-04-25 MED ORDER — FENTANYL CITRATE (PF) 100 MCG/2ML IJ SOLN
INTRAMUSCULAR | Status: DC | PRN
  Administered 2021-04-25: 50 ug via INTRAVENOUS

## 2021-04-25 MED ORDER — PHENYLEPHRINE HCL-NACL 20-0.9 MG/250ML-% IV SOLN
INTRAVENOUS | Status: DC | PRN
  Administered 2021-04-25: 25 ug/min via INTRAVENOUS

## 2021-04-25 MED ORDER — PROPOFOL 10 MG/ML IV BOLUS
INTRAVENOUS | Status: AC
  Filled 2021-04-25: qty 20

## 2021-04-25 MED ORDER — ONDANSETRON HCL 4 MG/2ML IJ SOLN: 4.0000 mg | Freq: Four times a day (QID) | INTRAMUSCULAR | Status: DC | PRN

## 2021-04-25 MED ORDER — 0.9 % SODIUM CHLORIDE (POUR BTL) OPTIME
TOPICAL | Status: DC | PRN
  Administered 2021-04-25: 1000 mL

## 2021-04-25 MED ORDER — OXYCODONE HCL 5 MG PO TABS
5.0000 mg | ORAL_TABLET | Freq: Once | ORAL | Status: DC | PRN
Start: 2021-04-25 — End: 2021-04-25

## 2021-04-25 MED ORDER — PHENYLEPHRINE HCL (PRESSORS) 10 MG/ML IV SOLN
INTRAVENOUS | Status: AC
  Filled 2021-04-25: qty 2

## 2021-04-25 MED ORDER — METOCLOPRAMIDE HCL 5 MG PO TABS: 5.0000 mg | ORAL_TABLET | Freq: Three times a day (TID) | ORAL | Status: DC | PRN

## 2021-04-25 MED ORDER — ONDANSETRON HCL 4 MG PO TABS: 4.0000 mg | ORAL_TABLET | Freq: Four times a day (QID) | ORAL | Status: DC | PRN

## 2021-04-25 MED ORDER — PROPOFOL 10 MG/ML IV BOLUS
INTRAVENOUS | Status: DC | PRN
  Administered 2021-04-25: 150 mg via INTRAVENOUS

## 2021-04-25 MED ORDER — DOCUSATE SODIUM 100 MG PO CAPS
100.0000 mg | ORAL_CAPSULE | Freq: Two times a day (BID) | ORAL | Status: DC
  Administered 2021-04-25 – 2021-04-27 (×5): 100 mg via ORAL
  Filled 2021-04-25 (×5): qty 1

## 2021-04-25 MED ORDER — SUGAMMADEX SODIUM 200 MG/2ML IV SOLN
INTRAVENOUS | Status: DC | PRN
  Administered 2021-04-25: 300 mg via INTRAVENOUS

## 2021-04-25 MED ORDER — OXYCODONE HCL 5 MG/5ML PO SOLN
5.0000 mg | Freq: Once | ORAL | Status: DC | PRN
Start: 2021-04-25 — End: 2021-04-25

## 2021-04-25 MED ORDER — ENOXAPARIN SODIUM 40 MG/0.4ML IJ SOSY
40.0000 mg | PREFILLED_SYRINGE | INTRAMUSCULAR | Status: DC
  Administered 2021-04-26 – 2021-04-28 (×3): 40 mg via SUBCUTANEOUS
  Filled 2021-04-25 (×3): qty 0.4

## 2021-04-25 SURGICAL SUPPLY — 40 items
BAG COUNTER SPONGE SURGICOUNT (BAG) IMPLANT
BAG ZIPLOCK 12X15 (MISCELLANEOUS) IMPLANT
BIT DRILL 4.8X300 (BIT) ×1 IMPLANT
CHLORAPREP W/TINT 26 (MISCELLANEOUS) ×2 IMPLANT
COVER PERINEAL POST (MISCELLANEOUS) ×2 IMPLANT
COVER SURGICAL LIGHT HANDLE (MISCELLANEOUS) ×2 IMPLANT
DERMABOND ADVANCED (GAUZE/BANDAGES/DRESSINGS) ×1
DERMABOND ADVANCED .7 DNX12 (GAUZE/BANDAGES/DRESSINGS) ×1 IMPLANT
DRAPE C-ARM 42X120 X-RAY (DRAPES) ×2 IMPLANT
DRAPE C-ARMOR (DRAPES) ×2 IMPLANT
DRAPE IMP U-DRAPE 54X76 (DRAPES) ×4 IMPLANT
DRAPE SHEET LG 3/4 BI-LAMINATE (DRAPES) ×4 IMPLANT
DRAPE STERI IOBAN 125X83 (DRAPES) ×2 IMPLANT
DRAPE U-SHAPE 47X51 STRL (DRAPES) ×4 IMPLANT
DRSG AQUACEL AG 3.5X4 (GAUZE/BANDAGES/DRESSINGS) ×1 IMPLANT
DRSG AQUACEL AG ADV 3.5X 6 (GAUZE/BANDAGES/DRESSINGS) ×2 IMPLANT
ELECT REM PT RETURN 15FT ADLT (MISCELLANEOUS) ×2 IMPLANT
GLOVE SRG 8 PF TXTR STRL LF DI (GLOVE) ×1 IMPLANT
GLOVE SURG ENC MOIS LTX SZ8.5 (GLOVE) ×4 IMPLANT
GLOVE SURG ENC TEXT LTX SZ7.5 (GLOVE) ×4 IMPLANT
GLOVE SURG UNDER POLY LF SZ8 (GLOVE) ×2
GLOVE SURG UNDER POLY LF SZ8.5 (GLOVE) ×2 IMPLANT
GOWN STRL REUS W/TWL 2XL LVL3 (GOWN DISPOSABLE) ×2 IMPLANT
JET LAVAGE IRRISEPT WOUND (IRRIGATION / IRRIGATOR)
KIT BASIN OR (CUSTOM PROCEDURE TRAY) ×2 IMPLANT
KIT TURNOVER KIT A (KITS) ×2 IMPLANT
LAVAGE JET IRRISEPT WOUND (IRRIGATION / IRRIGATOR) IMPLANT
MANIFOLD NEPTUNE II (INSTRUMENTS) ×2 IMPLANT
NS IRRIG 1000ML POUR BTL (IV SOLUTION) ×2 IMPLANT
PACK GENERAL/GYN (CUSTOM PROCEDURE TRAY) ×2 IMPLANT
PENCIL SMOKE EVACUATOR (MISCELLANEOUS) IMPLANT
PIN GUIDE DRILL TIP 2.8X300 (DRILL) ×3 IMPLANT
PROTECTOR NERVE ULNAR (MISCELLANEOUS) ×2 IMPLANT
SCREW PARTIAL THREAD 8.0X90MM (Screw) ×3 IMPLANT
SUT MNCRL AB 3-0 PS2 18 (SUTURE) ×2 IMPLANT
SUT MON AB 2-0 CT1 36 (SUTURE) ×2 IMPLANT
SUT VIC AB 2-0 CT1 27 (SUTURE) ×4
SUT VIC AB 2-0 CT1 TAPERPNT 27 (SUTURE) ×2 IMPLANT
SUT VICRYL 0 UR6 27IN ABS (SUTURE) ×2 IMPLANT
TOWEL OR 17X26 10 PK STRL BLUE (TOWEL DISPOSABLE) ×2 IMPLANT

## 2021-04-25 NOTE — Op Note (Signed)
OPERATIVE REPORT  SURGEON: Rod Can, MD   ASSISTANT: Staff.  PREOPERATIVE DIAGNOSIS: Right femoral neck fracture.   POSTOPERATIVE DIAGNOSIS: Right femoral neck fracture.   PROCEDURE: Percutaneous screw fixation, Right femoral neck.   IMPLANTS: Biomet 8.0 mm cannulated screws 3.  ANESTHESIA:  General  ESTIMATED BLOOD LOSS:-25 mL    ANTIBIOTICS: 3 g Ancef.  DRAINS: None.  COMPLICATIONS: None.   CONDITION: PACU - hemodynamically stable.  BRIEF CLINICAL NOTE: [name] is a 80 y.o. male who presented with an incomplete tension sided femoral neck fracture diagnosed by CT scan after a ground level fall at home. The patient was admitted to the hospitalist service and underwent perioperative risk stratification and medical optimization. The risks, benefits, and alternatives to the procedure were explained, and the patient elected to proceed.  PROCEDURE IN DETAIL: Surgical site was marked by myself. The patient was taken to the operating room and anesthesia was induced on the bed. The patient was then transferred to the [facility] table and the nonoperative lower extremity was scissored underneath the operative side. The hip was prepped and draped in the normal sterile surgical fashion. Timeout was called verifying side and site of surgery. Preop antibiotics were given with 60 minutes of beginning the procedure.  Fluoroscopy was used to define the patient's anatomy. A 2 cm incision was made over the lateral aspect of the proximal femur. Guidepin was placed inferiorly on the AP x-ray, and centrally on the lateral x-ray. 2 additional guide pins were placed proximal to the first pin, one anterior and one posterior. Pin position was confirmed with biplanar fluoroscopy. The pins were sequentially measured, the near cortex was drilled, and the screws were placed. Final AP and lateral fluoroscopy views were obtained to confirm fracture reduction and hardware placement. There was no chondral  penetration.  The wounds were copiously irrigated with saline. The wound was closed in layers with #1 Vicryl for the fascia, 2-0 Monocryl for the deep dermal layer, and 3-0 Monocryl subcuticular stitch. Glue was applied to the skin. Once the glue was fully hardened, sterile dressing was applied. The patient was then awakened from anesthesia and taken to the PACU in stable condition. Sponge needle and instrument counts were correct at the end of the case 2. There were no known complications.  We will readmit the patient to the hospitalist. Weightbearing status will be weightbearing as tolerated with a walker. We will begin Lovenox and dicharge on ASA 81 mg PO BID for DVT prophylaxis. The patient will work with physical therapy and undergo disposition planning.  Please note that a surgical assistant was a medical necessity for this procedure to perform it in a safe and expeditious manner. Assistant was necessary to provide appropriate retraction of vital neurovascular structures, to prevent femoral fracture, and to allow for anatomic placement of the prosthesis.

## 2021-04-25 NOTE — Transfer of Care (Signed)
Immediate Anesthesia Transfer of Care Note  Patient: [name]  Procedure(s) Performed: CANNULATED HIP PINNING (Right: Hip)  Patient Location: PACU  Anesthesia Type:General  Level of Consciousness: awake, alert , oriented and patient cooperative  Airway & Oxygen Therapy: Patient Spontanous Breathing and Patient connected to face mask oxygen  Post-op Assessment: Report given to RN, Post -op Vital signs reviewed and stable and Patient moving all extremities  Post vital signs: Reviewed and stable  Last Vitals:  Vitals Value Taken Time  BP 142/69 04/25/21 0936  Temp    Pulse 94 04/25/21 0939  Resp    SpO2 100 % 04/25/21 0939  Vitals shown include unvalidated device data.  Last Pain:  Vitals:   04/25/21 0040  TempSrc: Oral  PainSc:       Patients Stated Pain Goal: 2 (46/56/81 2751)  Complications: No notable events documented.

## 2021-04-25 NOTE — Progress Notes (Signed)
Initial Nutrition Assessment  DOCUMENTATION CODES:   Obesity unspecified  INTERVENTION:  - will order Ensure Surgery BID, each supplement provides 330 kcal and 18 grams protein.   NUTRITION DIAGNOSIS:   Increased nutrient needs related to post-op healing as evidenced by estimated needs.  GOAL:   Patient will meet greater than or equal to 90% of their needs  MONITOR:   PO intake, Supplement acceptance, Labs, Weight trends, Skin  REASON FOR ASSESSMENT:   Consult Hip fracture protocol  ASSESSMENT:   80 y.o. 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 He presented to the ED with R hip pain after a fall the night prior.  He ate 100% of breakfast and lunch and 0% of dinner yesterday. He has been NPO since midnight.   Currently out of the room for R femoral neck surgery (percutaneous screw fixation of R femoral neck) and noted to currently be in PACU.   MST score of 0. Weight on 7/2 was 276 lb and weight has been stable for at least the past 7 months.    Labs reviewed; CBGs: 153, 188, 192 mg/dl, Na: 134 mmol/l, K: 3.3 mmol/l, BUN: 31 mg/d, creatinine: 1.51 mg/dl, Ca: 8.4 mg/dl, GFR: 47 ml/min.  Medications reviewed; sliding scale novolog. IVF; NS @ 125 ml/hr.     NUTRITION - FOCUSED PHYSICAL EXAM:  Unable to complete at this time.   Diet Order:   Diet Order             Diet NPO time specified Except for: Sips with Meds  Diet effective midnight                   EDUCATION NEEDS:   No education needs have been identified at this time  Skin:  Skin Assessment: Skin Integrity Issues: Skin Integrity Issues:: Incisions Incisions: R hip (7/4)  Last BM:  PTA/unknown  Height:   Ht Readings from Last 1 Encounters:  04/23/21 6' (1.829 m)    Weight:   Wt Readings from Last 1 Encounters:  04/23/21 125.1 kg     Estimated Nutritional Needs:  Kcal:  2000-2250 kcal Protein:  110-120 grams Fluid:  >/= 2.3  L/day       Jarome Matin, MS, RD, LDN, CNSC Inpatient Clinical Dietitian RD pager # available in [place]  After hours/weekend pager # available in [facility]

## 2021-04-25 NOTE — Anesthesia Postprocedure Evaluation (Signed)
Anesthesia Post Note  Patient: [name]  Procedure(s) Performed: CANNULATED HIP PINNING (Right: Hip)     Patient location during evaluation: PACU Anesthesia Type: General Level of consciousness: awake Pain management: pain level controlled Vital Signs Assessment: post-procedure vital signs reviewed and stable Respiratory status: spontaneous breathing and respiratory function stable Cardiovascular status: stable Postop Assessment: no apparent nausea or vomiting Anesthetic complications: no   No notable events documented.  Last Vitals:  Vitals:   04/25/21 1000 04/25/21 1015  BP: 128/64 (!) 125/51  Pulse: 89 84  Resp: 16   Temp:    SpO2: 100% 97%    Last Pain:  Vitals:   04/25/21 1015  TempSrc:   PainSc: 3                  Candra R Cindie Rajagopalan

## 2021-04-25 NOTE — Progress Notes (Signed)
Patient ID: [name], male   DOB: 1940-11-17, 80 y.o.   MRN: [id number]  PROGRESS NOTE    [name]  ATF:[id number] DOB: 09/24/1941 DOA: 04/23/2021 PCP: Libby Maw, MD    Brief Narrative:  [name] is a 80 y.o. 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 He reports that around 1 am he reached over the nightstand and was going to adjust his CPAP machine when he knocked a clock onto the floor.  He got up to pick up the clock but he lost his balance and fell over landing on his right hip.  He reports instantaneous pain in the right hip and he was not able to stand up.  He laid on the floor until approximately 5:30 in the morning when his wife found him laying on the floor.  EMS was called and firemen came to the house and were able to lift him get him back into the bed.  At that time he refused transport to the hospital for further evaluation.  He continued to have severe right hip pain was unable to stand up or bear weight on his right leg.  He had increased pain with movement of his right hip or leg.  Reports the pain was a 10 out of 10 at its worst.  States he did not hit his head and he had no loss of consciousness.  He has not had any chest pain, palpitations, cough, shortness of breath.  He denies any urinary symptoms.  Lives with his wife and brother.  He has a 62-pack-year history of smoking but quit in 1984.  Denies alcohol or illicit drug use.  He is a retired Engineer, structural.   Assessment & Plan:   Principal Problem:   Closed right hip fracture [facility]) Active Problems:   Obstructive sleep apnea   Essential hypertension   Hypokalemia   Rhabdomyolysis   Acute kidney injury superimposed on CKD (HCC)   Type 2 diabetes mellitus without complication, with long-term current use of insulin (HCC)  Right femoral neck fracture Status post percutaneous screw fixation with Ortho on  04/25/2021 PT consult  Can be weightbearing as tolerated with a walker  Plan for DVT prophylaxis is Lovenox while in hospital with transition to 81 mg aspirin twice daily at discharge.  Rhabdomyolysis Initial CK is elevated given IV fluid hydration.  CK and creatinine are trending down.   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   Avoid nephrotoxic agents Trend  Type 2 diabetes mellitus without complication, with long-term current use of insulin Sliding scale insulin for use now.   Postsurgically can resume his home insulin pump.   Obstructive sleep apnea Continue home CPAP as he uses at home.  RT to follow   Hypokalemia Replete and trend   Hypomagnesemia Replete and trend     DVT prophylaxis: Lovenox SQ plan is for Lovenox while in hospital with transition to 81 mg aspirin twice daily at discharge. Code Status: Full code  Family Communication: Patient at bedside Disposition Plan: Home pending PT eval.  May be a candidate for inpatient rehab depending on how well he does postsurgically.   Consultants:  Ortho  Procedures: Percutaneous screw fixation right femoral neck  Antimicrobials: Anti-infectives (From admission, onward)    Start     Dose/Rate Route Frequency Ordered Stop  04/25/21 1400  ceFAZolin (ANCEF) IVPB 2g/100 mL premix        2 g 200 mL/hr over 30 Minutes Intravenous Every 6 hours 04/25/21 1151 04/26/21 0159   04/25/21 0600  ceFAZolin (ANCEF) IVPB 3g/100 mL premix        3 g 200 mL/hr over 30 Minutes Intravenous On call to O.R. 04/24/21 1946 04/25/21 0849        Subjective: Patient is quite sleepy today post anesthesia.  He is arousable.  Objective: Vitals:   04/25/21 1045 04/25/21 1100 04/25/21 1125 04/25/21 1337  BP: 120/63 120/62 127/66 111/69  Pulse: 83 82 80 75  Resp: 16 16 16 18   Temp:  98.2 F (36.8 C) 97.7 F (36.5 C) 97.9 F (36.6 C)  TempSrc:   Oral    SpO2: 96% 97% 94% 99%  Weight:      Height:        Intake/Output Summary (Last 24 hours) at 04/25/2021 1357 Last data filed at 04/25/2021 0940 Gross per 24 hour  Intake 4008.91 ml  Output 1256 ml  Net 2752.91 ml   Filed Weights   04/23/21 1834  Weight: 125.1 kg    Examination:  General exam: Appears calm and comfortable  Respiratory system: Clear to auscultation. Respiratory effort normal. Cardiovascular system: S1 & S2 heard, RRR.  Gastrointestinal system: Abdomen is nondistended, soft and nontender.  Central nervous system: Alert and oriented. No focal neurological deficits. Extremities: Symmetric  Skin: No rashes Psychiatry: Judgement and insight appear normal. Mood & affect appropriate.     Data Reviewed: I have personally reviewed following labs and imaging studies  CBC: Recent Labs  Lab 04/23/21 1842 04/25/21 0332  WBC 10.0 7.0  NEUTROABS 8.9* 5.7  HGB 13.7 11.9*  HCT 40.3 35.3*  MCV 89.8 90.1  PLT 101* 86*   Basic Metabolic Panel: Recent Labs  Lab 04/23/21 1842 04/24/21 0303 04/24/21 1843 04/25/21 1000  NA 132* 132* 133* 134*  K 3.2* 3.6 3.7 3.3*  CL 99 99 98 102  CO2 24 24 25  21*  GLUCOSE 151* 138* 236* 202*  BUN 31* 30* 33* 31*  CREATININE 2.06* 1.86* 1.76* 1.51*  CALCIUM 8.7* 8.4* 8.7* 8.4*  MG  --  1.5*  --   --     Cardiac Enzymes: Recent Labs  Lab 04/23/21 1842 04/24/21 0303  CKTOTAL 1,208* 1,155*    CBG: Recent Labs  Lab 04/24/21 1936 04/25/21 0030 04/25/21 0559 04/25/21 0954 04/25/21 1140  GLUCAP 116* 253* 188* 192* 194*     Recent Results (from the past 240 hour(s))  Resp Panel by RT-PCR (Flu A&B, Covid) Nasopharyngeal Swab     Status: None   Collection Time: 04/23/21  8:01 PM   Specimen: Nasopharyngeal Swab; Nasopharyngeal(NP) swabs in vial transport medium  Result Value Ref Range Status   SARS Coronavirus 2 by RT PCR NEGATIVE NEGATIVE Final    Comment: (NOTE) SARS-CoV-2 target nucleic acids are NOT  DETECTED.  The SARS-CoV-2 RNA is generally detectable in upper respiratory specimens during the acute phase of infection. The lowest concentration of SARS-CoV-2 viral copies this assay can detect is 138 copies/mL. A negative result does not preclude SARS-Cov-2 infection and should not be used as the sole basis for treatment or other patient management decisions. A negative result may occur with  improper specimen collection/handling, submission of specimen other than nasopharyngeal swab, presence of viral mutation(s) within the areas targeted by this assay, and inadequate number of viral copies(<138 copies/mL). A negative result  must be combined with clinical observations, patient history, and epidemiological information. The expected result is Negative.  Fact Sheet for Patients:  EntrepreneurPulse.com.au  Fact Sheet for Healthcare Providers:  IncredibleEmployment.be  This test is no t yet approved or cleared by the [place] FDA and  has been authorized for detection and/or diagnosis of SARS-CoV-2 by FDA under an Emergency Use Authorization (EUA). This EUA will remain  in effect (meaning this test can be used) for the duration of the COVID-19 declaration under Section 564(b)(1) of the Act, 21 U.S.C.section 360bbb-3(b)(1), unless the authorization is terminated  or revoked sooner.       Influenza A by PCR NEGATIVE NEGATIVE Final   Influenza B by PCR NEGATIVE NEGATIVE Final    Comment: (NOTE) The Xpert Xpress SARS-CoV-2/FLU/RSV plus assay is intended as an aid in the diagnosis of influenza from Nasopharyngeal swab specimens and should not be used as a sole basis for treatment. Nasal washings and aspirates are unacceptable for Xpert Xpress SARS-CoV-2/FLU/RSV testing.  Fact Sheet for Patients: EntrepreneurPulse.com.au  Fact Sheet for Healthcare Providers: IncredibleEmployment.be  This test is not yet  approved or cleared by the [place] FDA and has been authorized for detection and/or diagnosis of SARS-CoV-2 by FDA under an Emergency Use Authorization (EUA). This EUA will remain in effect (meaning this test can be used) for the duration of the COVID-19 declaration under Section 564(b)(1) of the Act, 21 U.S.C. section 360bbb-3(b)(1), unless the authorization is terminated or revoked.  Performed at [facility], [place] [address] [place], [place] [address]   Surgical pcr screen     Status: None   Collection Time: 04/23/21 11:29 PM   Specimen: Nasal Mucosa; Nasal Swab  Result Value Ref Range Status   MRSA, PCR NEGATIVE NEGATIVE Final   Staphylococcus aureus NEGATIVE NEGATIVE Final    Comment: (NOTE) The Xpert SA Assay (FDA approved for NASAL specimens in patients 54 years of age and older), is one component of a comprehensive surveillance program. It is not intended to diagnose infection nor to guide or monitor treatment. Performed at [facility], [place] [address] [place], [place] [address]       Radiology Studies: Pelvis Portable  Result Date: 04/25/2021 CLINICAL DATA:  Hip pending today.  Images done in recovery. EXAM: PORTABLE PELVIS 1-2 VIEWS COMPARISON:  Exam earlier today FINDINGS: Patient has undergone ORIF of RIGHT femoral neck fracture with 3 cannulated screws. Alignment is normal. There is no evidence for dislocation on the frontal view. Abdominal wall laxity. IMPRESSION: ORIF of the RIGHT hip.  No adverse features. Electronically Signed   By: Nolon Nations M.D.   On: 04/25/2021 11:18   CT Hip Right Wo Contrast  Result Date: 04/23/2021 CLINICAL DATA:  Hip trauma, fracture suspected, neg xray Fall last night with right hip pain. EXAM: CT OF THE RIGHT HIP WITHOUT CONTRAST TECHNIQUE: Multidetector CT imaging of the right hip was performed according to the standard protocol. Multiplanar CT image reconstructions were also generated.  COMPARISON:  Radiograph earlier today. FINDINGS: Bones/Joint/Cartilage Slight cortical step-off of the femoral head neck junction, best appreciated on coronal series 7, images 35-37, suspicious for incomplete nondisplaced fracture. Femoral head is well seated in the acetabulum. No acetabular or pubic rami fractures. There is mild acetabular spurring. Ligaments Suboptimally assessed by CT. Muscles and Tendons No confluent muscle hematoma or muscle atrophy. There is an intramuscular lipoma within the obturator external muscle. Soft tissues Prominent prostate gland. IMPRESSION: Slight cortical step-off of the  lateral femoral head neck junction, suspicious for incomplete nondisplaced femoral neck fracture. Electronically Signed   By: Keith Rake M.D.   On: 04/23/2021 20:50   DG Chest Port 1 View  Result Date: 04/23/2021 CLINICAL DATA:  Acute pain following fall. EXAM: PORTABLE CHEST 1 VIEW COMPARISON:  None. FINDINGS: Increased density overlying the UPPER LEFT lung may be related to the end of the LEFT 1st rib. The cardiomediastinal silhouette is unremarkable. There is no evidence of focal airspace disease, pulmonary edema, pleural effusion, or pneumothorax. No acute bony abnormalities are identified. IMPRESSION: Increased density overlying the UPPER LEFT lung which may be related to the end of the LEFT 1st rib. Recommend PA and lateral chest x-ray for further evaluation. No other significant abnormality. Electronically Signed   By: Margarette Canada M.D.   On: 04/23/2021 19:53   DG C-Arm 1-60 Min-No Report  Result Date: 04/25/2021 Fluoroscopy was utilized by the requesting physician.  No radiographic interpretation.   DG HIP OPERATIVE UNILAT W OR W/O PELVIS RIGHT  Result Date: 04/25/2021 CLINICAL DATA:  Intraoperative images RIGHT hip. EXAM: OPERATIVE RIGHT HIP (WITH PELVIS IF PERFORMED)  VIEWS TECHNIQUE: Fluoroscopic spot image(s) were submitted for interpretation post-operatively. COMPARISON:  04/23/2021 and  CT on 04/23/2021 FINDINGS: Two images are submitted, showing interval pinning of the RIGHT hip with 3 cannulated screws. There is no dislocation or interval fracture. IMPRESSION: Status post ORIF of the RIGHT hip. Electronically Signed   By: Nolon Nations M.D.   On: 04/25/2021 09:38   DG Hip Unilat W or Wo Pelvis 2-3 Views Right  Result Date: 04/23/2021 CLINICAL DATA:  Acute RIGHT hip pain following fall yesterday. Initial encounter. EXAM: DG HIP (WITH OR WITHOUT PELVIS) 2-3V RIGHT COMPARISON:  None. FINDINGS: There is no evidence of hip fracture or dislocation. There is no evidence of arthropathy or other focal bone abnormality. IMPRESSION: Negative. Electronically Signed   By: Margarette Canada M.D.   On: 04/23/2021 19:50     Scheduled Meds:  amitriptyline  25 mg Oral QHS   aspirin EC  81 mg Oral Daily   docusate sodium  100 mg Oral BID   [START ON 04/26/2021] enoxaparin (LOVENOX) injection  40 mg Subcutaneous Q24H   feeding supplement  237 mL Oral BID BM   insulin aspart  0-20 Units Subcutaneous Q6H   senna  1 tablet Oral BID   Continuous Infusions:  sodium chloride 125 mL/hr at 04/24/21 2319    ceFAZolin (ANCEF) IV     methocarbamol (ROBAXIN) IV Stopped (04/25/21 1113)     LOS: 2 days    Donnamae Jude, MD 04/25/2021 1:57 PM [phone] [facility] If 7PM-7AM, please contact night-coverage 04/25/2021, 1:57 PM

## 2021-04-25 NOTE — Discharge Instructions (Signed)
 Dr. Kazimierz Springborn Adult Hip & Knee Specialist [facility] [address] [place], [place] [address] [phone]   POSTOPERATIVE DIRECTIONS    Hip Rehabilitation, Guidelines Following Surgery   WEIGHT BEARING Weight bearing as tolerated with assist device (walker, cane, etc) as directed, use it as long as suggested by your surgeon or therapist, typically at least 4-6 weeks.   HOME CARE INSTRUCTIONS  Remove items at home which could result in a fall. This includes throw rugs or furniture in walking pathways.  Continue medications as instructed at time of discharge. You may have some home medications which will be placed on hold until you complete the course of blood thinner medication. 4 days after discharge, you may start showering. No tub baths or soaking your incisions. Do not put on socks or shoes without following the instructions of your caregivers.   Sit on chairs with arms. Use the chair arms to help push yourself up when arising.  Arrange for the use of a toilet seat elevator so you are not sitting low.  Walk with walker as instructed.  You may resume a sexual relationship in one month or when given the OK by your caregiver.  Use walker as long as suggested by your caregivers.  Avoid periods of inactivity such as sitting longer than an hour when not asleep. This helps prevent blood clots.  You may return to work once you are cleared by your surgeon.  Do not drive a car for 6 weeks or until released by your surgeon.  Do not drive while taking narcotics.  Wear elastic stockings for two weeks following surgery during the day but you may remove then at night.  Make sure you keep all of your appointments after your operation with all of your doctors and caregivers. You should call the office at the above phone number and make an appointment for approximately two weeks after the date of your surgery. Please pick up a stool softener and laxative for  home use as long as you are requiring pain medications. ICE to the affected hip every three hours for 30 minutes at a time and then as needed for pain and swelling. Continue to use ice on the hip for pain and swelling from surgery. You may notice swelling that will progress down to the foot and ankle.  This is normal after surgery.  Elevate the leg when you are not up walking on it.   It is important for you to complete the blood thinner medication as prescribed by your doctor. Continue to use the breathing machine which will help keep your temperature down.  It is common for your temperature to cycle up and down following surgery, especially at night when you are not up moving around and exerting yourself.  The breathing machine keeps your lungs expanded and your temperature down.  RANGE OF MOTION AND STRENGTHENING EXERCISES  These exercises are designed to help you keep full movement of your hip joint. Follow your caregiver's or physical therapist's instructions. Perform all exercises about fifteen times, three times per day or as directed. Exercise both hips, even if you have had only one joint replacement. These exercises can be done on a training (exercise) mat, on the floor, on a table or on a bed. Use whatever works the best and is most comfortable for you. Use music or television while you are exercising so that the exercises are a pleasant break in your day. This will make your life better   with the exercises acting as a break in routine you can look forward to.  Lying on your back, slowly slide your foot toward your buttocks, raising your knee up off the floor. Then slowly slide your foot back down until your leg is straight again.  Lying on your back spread your legs as far apart as you can without causing discomfort.  Lying on your side, raise your upper leg and foot straight up from the floor as far as is comfortable. Slowly lower the leg and repeat.  Lying on your back, tighten up the muscle  in the front of your thigh (quadriceps muscles). You can do this by keeping your leg straight and trying to raise your heel off the floor. This helps strengthen the largest muscle supporting your knee.  Lying on your back, tighten up the muscles of your buttocks both with the legs straight and with the knee bent at a comfortable angle while keeping your heel on the floor.   SKILLED REHAB INSTRUCTIONS: If the patient is transferred to a skilled rehab facility following release from the hospital, a list of the current medications will be sent to the facility for the patient to continue.  When discharged from the skilled rehab facility, please have the facility set up the patient's [place] prior to being released. Also, the skilled facility will be responsible for providing the patient with their medications at time of release from the facility to include their pain medication and their blood thinner medication. If the patient is still at the rehab facility at time of the two week follow up appointment, the skilled rehab facility will also need to assist the patient in arranging follow up appointment in our office and any transportation needs.  MAKE SURE YOU:  Understand these instructions.  Will watch your condition.  Will get help right away if you are not doing well or get worse.  Pick up stool softner and laxative for home use following surgery while on pain medications. Do not remove your bandage. Your bandage is waterproof. It's ok to take showers - no tub baths or soaking the bandage. Continue to use ice for pain and swelling after surgery. Do not use any lotions or creams on the incision until instructed by your surgeon.

## 2021-04-25 NOTE — Anesthesia Procedure Notes (Signed)

## 2021-04-25 NOTE — Interval H&P Note (Signed)
History and Physical Interval Note:  04/25/2021 8:09 AM  [name]  has presented today for surgery, with the diagnosis of RIGHT FEMORAL NECK FRACTURE.  The various methods of treatment have been discussed with the patient and family. After consideration of risks, benefits and other options for treatment, the patient has consented to  Procedure(s): CANNULATED HIP PINNING (Right) as a surgical intervention.  The patient's history has been reviewed, patient examined, no change in status, stable for surgery.  I have reviewed the patient's chart and labs.  Questions were answered to the patient's satisfaction.    The risks, benefits, and alternatives were discussed with the patient. There are risks associated with the surgery including, but not limited to, problems with anesthesia (death), infection, differences in leg length/angulation/rotation, fracture of bones, loosening or failure of implants, malunion, nonunion, hematoma (blood accumulation) which may require surgical drainage, blood clots, pulmonary embolism, nerve injury (foot drop), and blood vessel injury. The patient understands these risks and elects to proceed.    Hilton Cork Lavontae Cornia

## 2021-04-26 ENCOUNTER — Encounter (HOSPITAL_COMMUNITY): Payer: Self-pay | Admitting: Orthopedic Surgery

## 2021-04-26 LAB — GLUCOSE, CAPILLARY
Glucose-Capillary: 276 mg/dL — ABNORMAL HIGH (ref 70–99)
Glucose-Capillary: 263 mg/dL — ABNORMAL HIGH (ref 70–99)
Glucose-Capillary: 318 mg/dL — ABNORMAL HIGH (ref 70–99)
Glucose-Capillary: 306 mg/dL — ABNORMAL HIGH (ref 70–99)

## 2021-04-26 LAB — BASIC METABOLIC PANEL
Anion gap: 9 (ref 5–15)
BUN: 26 mg/dL — ABNORMAL HIGH (ref 8–23)
CO2: 23 mmol/L (ref 22–32)
Calcium: 8.6 mg/dL — ABNORMAL LOW (ref 8.9–10.3)
Chloride: 102 mmol/L (ref 98–111)
Creatinine, Ser: 1.43 mg/dL — ABNORMAL HIGH (ref 0.61–1.24)
GFR, Estimated: 50 mL/min — ABNORMAL LOW (ref >60)
Glucose, Bld: 332 mg/dL — ABNORMAL HIGH (ref 70–99)
Potassium: 4 mmol/L (ref 3.5–5.1)
Sodium: 134 mmol/L — ABNORMAL LOW (ref 135–145)

## 2021-04-26 LAB — CBC
HCT: 39.4 % (ref 39.0–52.0)
Hemoglobin: 12.9 g/dL — ABNORMAL LOW (ref 13.0–17.0)
MCH: 30.1 pg (ref 26.0–34.0)
MCHC: 32.7 g/dL (ref 30.0–36.0)
MCV: 91.8 fL (ref 80.0–100.0)
Platelets: 90 10*3/uL — ABNORMAL LOW (ref 150–400)
RBC: 4.29 MIL/uL (ref 4.22–5.81)
RDW: 14.8 % (ref 11.5–15.5)
WBC: 6.5 10*3/uL (ref 4.0–10.5)
nRBC: 0 % (ref 0.0–0.2)

## 2021-04-26 MED ORDER — INSULIN ASPART 100 UNIT/ML IJ SOLN
0.0000 [IU] | Freq: Three times a day (TID) | INTRAMUSCULAR | Status: DC
  Administered 2021-04-26: 8 [IU] via SUBCUTANEOUS
  Administered 2021-04-26: 11 [IU] via SUBCUTANEOUS
  Administered 2021-04-27: 5 [IU] via SUBCUTANEOUS
  Administered 2021-04-27 (×2): 8 [IU] via SUBCUTANEOUS
  Administered 2021-04-28 (×2): 3 [IU] via SUBCUTANEOUS
  Administered 2021-04-28: 8 [IU] via SUBCUTANEOUS

## 2021-04-26 MED ORDER — INSULIN GLARGINE 100 UNIT/ML ~~LOC~~ SOLN
5.0000 [IU] | Freq: Every day | SUBCUTANEOUS | Status: DC
  Administered 2021-04-26: 5 [IU] via SUBCUTANEOUS
  Filled 2021-04-26 (×2): qty 0.05

## 2021-04-26 MED ORDER — INSULIN ASPART 100 UNIT/ML IJ SOLN
0.0000 [IU] | Freq: Every day | INTRAMUSCULAR | Status: DC
  Administered 2021-04-26: 4 [IU] via SUBCUTANEOUS
  Administered 2021-04-27: 2 [IU] via SUBCUTANEOUS

## 2021-04-26 NOTE — TOC Initial Note (Addendum)
Transition of Care [facility]) - Initial/Assessment Note    Patient Details  Name: [name] MRN: [id number] Date of Birth: 1940-12-11  Transition of Care [facility]) CM/SW Contact:    Lennart Pall, LCSW Phone Number: 04/26/2021, 1:10 PM  Clinical Narrative:                 Met with pt and wife this morning to introduce self/ TOC role.  Both very pleasant and note he had surgery for hip fx yesterday and was awaiting PT visit.  Spouse reports that they have decided the best dc plan is to pursue short term SNF rehab so that his mobility is improved for his return home.  She is the sole caregiver and "need him to be doing a little better to manage."  They note their preferred SNF would be [facility].  Will begin bed search and insurance authorization.  Addendum:  I see that PT has recommended CIR.  Will await decision from CIR and, if declined, will start SNF auth.  Expected Discharge Plan: Skilled Nursing Facility Barriers to Discharge: Continued Medical Work up   Patient Goals and CMS Choice Patient states their goals for this hospitalization and ongoing recovery are:: return home following rehab CMS Medicare.gov Compare Post Acute Care list provided to:: Patient Choice offered to / list presented to : Patient  Expected Discharge Plan and Services Expected Discharge Plan: [place] In-house Referral: Clinical Social Work Discharge Planning Services: NA Post Acute Care Choice: [place] Living arrangements for the past 2 months: [place]                 DME Arranged: N/A                    Prior Living Arrangements/Services Living arrangements for the past 2 months: Single Family Home Lives with:: Spouse Patient language and need for interpreter reviewed:: Yes Do you feel safe going back to the place where you live?: Yes      Need for Family Participation in Patient Care: Yes (Comment) Care giver support system in place?: Yes (comment)    Criminal Activity/Legal Involvement Pertinent to Current Situation/Hospitalization: No - Comment as needed  Activities of Daily Living Home Assistive Devices/Equipment: None ADL Screening (condition at time of admission) Patient's cognitive ability adequate to safely complete daily activities?: Yes Is the patient deaf or have difficulty hearing?: No Does the patient have difficulty seeing, even when wearing glasses/contacts?: No Does the patient have difficulty concentrating, remembering, or making decisions?: No Patient able to express need for assistance with ADLs?: Yes Does the patient have difficulty dressing or bathing?: No Independently performs ADLs?: Yes (appropriate for developmental age) Does the patient have difficulty walking or climbing stairs?: No Weakness of Legs: Right Weakness of Arms/Hands: None  Permission Sought/Granted Permission sought to share information with : Family Supports, Chartered certified accountant granted to share information with : Yes, Verbal Permission Granted  Share Information with NAME: Kit Mollett     Permission granted to share info w Relationship: spouse  Permission granted to share info w Contact Information: [phone]  Emotional Assessment Appearance:: Appears stated age Attitude/Demeanor/Rapport: Gracious Affect (typically observed): Accepting, Pleasant Orientation: : Oriented to Self, Oriented to Place, Oriented to  Time, Oriented to Situation Alcohol / Substance Use: Not Applicable Psych Involvement: No (comment)  Admission diagnosis:  Elevated CK [R74.8] Closed right hip fracture (HCC) [S72.001A] Closed fracture of right hip, initial encounter [facility]) [S72.001A] Patient Active  Problem List   Diagnosis Date Noted   Closed right hip fracture ([place]) 04/23/2021   Rhabdomyolysis 04/23/2021   Acute kidney injury superimposed on CKD ([place]) 04/23/2021   Type 2 diabetes mellitus without complication, with long-term  current use of insulin ([place]) 04/23/2021   Abnormality of gait 02/21/2021   Renal insufficiency 02/21/2021   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[place]) 11/17/2020   Aortic atherosclerosis ([place]) 11/17/2020   Coronary artery calcification 11/17/2020   Hypokalemia 11/17/2020   Obesity, diabetes, and hypertension syndrome ([place]) 09/23/2020   Other hyperlipidemia 09/23/2020   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 sciatica 03/15/2020   Diabetic peripheral neuropathy ([place]) 12/16/2019   Myalgia 12/16/2019   Transient visual disturbance 10/20/2019   Elevated TSH 10/09/2019   Stage 3a chronic kidney disease ([place]) 10/09/2019   Neuropathic pain 09/15/2019   Elevated LDL cholesterol level 07/10/2019   B12 deficiency 07/10/2019   Iron deficiency 07/10/2019   History of fall within past 90 days 07/10/2019   History of gout 07/10/2019   Skin lesion of left leg 07/10/2019   Frequent falls 06/18/2019   Gait disturbance 06/18/2019   Fall on same level from slipping, tripping, or stumbling 06/09/2019   Lung nodules 05/08/2019   Sleep disturbance 05/01/2019   Fall (on)(from) sidewalk curb, initial encounter 04/18/2019   Vitamin D deficiency 04/18/2019   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   Excessive cerumen in left ear canal 10/29/2018   Trigger finger, right ring finger 08/13/2018   Chest wall muscle strain 07/30/2018   Need for influenza vaccination 07/30/2018   Grieving 06/25/2018   ETD (Eustachian tube dysfunction), left 06/25/2018   Unilateral primary osteoarthritis, right knee 11/17/2016   Complex tear of lateral meniscus of right knee as current injury 10/26/2016   Radiculitis, lumbosacral 06/30/2016   Diabetes ([place]) 01/01/2016   Solitary pulmonary nodule 12/30/2015   Urolithiasis 02/23/2015   Paresthesia 09/07/2014   Routine general medical examination at a health care facility 07/07/2014   Disturbance of skin sensation 12/26/2013   UTI (urinary tract infection) 03/12/2013   Screening for prostate cancer  02/14/2012   Right knee pain 11/15/2011   Encounter for long-term (current) use of other medications 01/17/2011   Special screening examination for neoplasm of prostate 01/17/2011   MYCOSIS FUNGOIDES 12/29/2009   HEARING LOSS 09/30/2009   ECZEMA 09/30/2009   VITAMIN B12 DEFICIENCY 09/10/2009   MORBID OBESITY 09/10/2009   SYNCOPE 33/38/3291   DIASTOLIC DYSFUNCTION 91/66/0600   CARDIAC MURMUR, SYSTOLIC 45/99/7741   Pancytopenia ([place]) 05/12/2008   Disorder resulting from impaired renal function 05/12/2008   EDEMA 05/12/2008   Obstructive sleep apnea 11/04/2007   BACK PAIN, LUMBAR 10/23/2007   Dyslipidemia 05/20/2007   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   Essential hypertension 05/20/2007   PCP:  Libby Maw, MD Pharmacy:   [facility] DR[facility] STORE [place], [place] AT [facility] [address] [place] [address] Phone: [phone] Fax: [phone]  [facility]  [facility]) - [place], [place] - [address] [place] [address] Phone: [phone] Fax: [phone]     Social Determinants of Health (SDOH) Interventions    Readmission Risk Interventions No flowsheet data found.

## 2021-04-26 NOTE — Progress Notes (Signed)
Inpatient Rehabilitation Admissions Coordinator   [facility] medicare payor unlikely to approve CIR/inpt rehab admit for this diagnosis therefore SNF for short term rehab is recommended. I have alerted acute team and TOC.  Danne Baxter, RN, MSN Rehab Admissions Coordinator [phone] 04/26/2021 1:48 PM

## 2021-04-26 NOTE — Evaluation (Addendum)
Physical Therapy Evaluation Patient Details Name: [name] MRN: [id number] DOB: 10-12-1941 Today's Date: 04/26/2021   History of Present Illness  80 y.o. male admitted 04/23/21 with right femoral neck fracture and s/p percutaneous screw fixation 04/25/21. Past medical history significant for DMT2 on insulin pump, HLD, OSA, OA, CKD  Clinical Impression  Patient is s/p above surgery resulting in functional limitations due to the deficits listed below (see PT Problem List). Patient will benefit from skilled PT to increase their independence and safety with mobility to allow discharge to the venue listed below.  Pt presents with decreased cognition and required max +2 assist for bed mobility and mod +2 for standing and transfer to recliner.  Pt's spouse present and agreeable to post acute rehab as she feels she cannot manage pt at home in current condition.  SpO2 86% on arrival to room (pt with nasal cannula in place however not attached to wall O2 source), applied 2L O2 [place] for mobilizing and SPO2 95% upon sitting in recliner.  RN notified and aware.     Follow Up Recommendations SNF     Equipment Recommendations  Rolling walker with 5" wheels;3in1 (PT)    Recommendations for Other Services       Precautions / Restrictions Precautions Precautions: Fall Precaution Comments: monitor sats Restrictions Weight Bearing Restrictions: No RLE Weight Bearing: Weight bearing as tolerated Other Position/Activity Restrictions: WBAT with walker      Mobility  Bed Mobility Overal bed mobility: Needs Assistance Bed Mobility: Supine to Sit     Supine to sit: Max assist;+2 for physical assistance;HOB elevated     General bed mobility comments: pt requiring assist for R LE, trunk and scooting to EOB    Transfers Overall transfer level: Needs assistance Equipment used: Rolling walker (2 wheeled) Transfers: Sit to/from [facility] Sit to Stand: From elevated surface;Mod assist;+2  physical assistance Stand pivot transfers: Mod assist;+2 physical assistance       General transfer comment: multimodal cues for safe technique, assist to rise and steady, difficulty lifting R LE to take small steps to recliner  Ambulation/Gait             General Gait Details: deferred for safety  Stairs            Wheelchair Mobility    Modified Rankin (Stroke Patients Only)       Balance Overall balance assessment: History of Falls                                           Pertinent Vitals/Pain Pain Assessment: Faces Faces Pain Scale: Hurts little more Pain Location: right hip Pain Descriptors / Indicators: Grimacing;Guarding Pain Intervention(s): Monitored during session;Repositioned    Home Living Family/patient expects to be discharged to:: Private residence Living Arrangements: Spouse/significant other   Type of Home: House Home Access: Stairs to enter Entrance Stairs-Rails: Right Entrance Stairs-Number of Steps: 2 Home Layout: One level Home Equipment: None      Prior Function Level of Independence: Independent         Comments: uses CPAP at night     Hand Dominance        Extremity/Trunk Assessment        Lower Extremity Assessment Lower Extremity Assessment: Generalized weakness;RLE deficits/detail;Difficult to assess due to impaired cognition RLE Deficits / Details: requiring assist due to pain  Communication   Communication: No difficulties  Cognition Arousal/Alertness: Awake/alert Behavior During Therapy: Impulsive Overall Cognitive Status: Impaired/Different from baseline                                 General Comments: pt's wife present and confirms pt is not at baseline, pt confused and making unrelated statements, pt also perseverating on lines; reliant on multimodal cues for technique and safety      General Comments      Exercises     Assessment/Plan    PT  Assessment Patient needs continued PT services  PT Problem List Decreased balance;Decreased range of motion;Decreased strength;Decreased mobility;Decreased coordination;Obesity;Decreased knowledge of use of DME;Pain;Decreased cognition       PT Treatment Interventions DME instruction;Gait training;Balance training;Therapeutic exercise;Functional mobility training;Therapeutic activities;Patient/family education;Wheelchair mobility training    PT Goals (Current goals can be found in the Care Plan section)  Acute Rehab PT Goals PT Goal Formulation: With patient/family Time For Goal Achievement: 05/03/21 Potential to Achieve Goals: Good    Frequency Min 3X/week   Barriers to discharge        Co-evaluation               AM-PAC PT "6 Clicks" Mobility  Outcome Measure Help needed turning from your back to your side while in a flat bed without using bedrails?: A Lot Help needed moving from lying on your back to sitting on the side of a flat bed without using bedrails?: A Lot Help needed moving to and from a bed to a chair (including a wheelchair)?: A Lot Help needed standing up from a chair using your arms (e.g., wheelchair or bedside chair)?: A Lot Help needed to walk in hospital room?: Total Help needed climbing 3-5 steps with a railing? : Total 6 Click Score: 10    End of Session Equipment Utilized During Treatment: Gait belt;Oxygen Activity Tolerance: Patient tolerated treatment well Patient left: in chair;with call bell/phone within reach;with bed alarm set;with family/visitor present Nurse Communication: Mobility status (notified of pt's confusion, on 2L O2 [place] and up in recliner) PT Visit Diagnosis: Other abnormalities of gait and mobility (R26.89)    Time: 3419-3790 PT Time Calculation (min) (ACUTE ONLY): 24 min   Charges:   PT Evaluation $PT Eval Low Complexity: 1 Low PT Treatments $Therapeutic Activity: 8-22 mins      Jannette Spanner PT, DPT Acute Rehabilitation  Services Pager: [phone] Office: [place] 04/26/2021, 12:34 PM

## 2021-04-26 NOTE — NC FL2 (Signed)
[place] LEVEL OF CARE SCREENING TOOL     IDENTIFICATION  Patient Name: [name] Birthdate: Mar 15, 1941 Sex: male Admission Date (Current Location): 04/23/2021  [facility] and [place] Number:  Herbalist and Address:  [facility] [place],  [place], [place]      Provider Number: [id number]  Attending Physician Name and Address:  Dwyane Dee, MD  Relative Name and Phone Number:  wife, Bryer Cozzolino @ [phone]    Current Level of Care: Hospital Recommended Level of Care: [place] Prior Approval Number:    Date Approved/Denied:   PASRR Number: [id number]  Discharge Plan: SNF    Current Diagnoses: Patient Active Problem List   Diagnosis Date Noted   Closed right hip fracture ([place]) 04/23/2021   Rhabdomyolysis 04/23/2021   Acute kidney injury superimposed on CKD ([place]) 04/23/2021   Type 2 diabetes mellitus without complication, with long-term current use of insulin ([place]) 04/23/2021   Abnormality of gait 02/21/2021   Renal insufficiency 02/21/2021   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[place]) 11/17/2020   Aortic atherosclerosis ([place]) 11/17/2020   Coronary artery calcification 11/17/2020   Hypokalemia 11/17/2020   Obesity, diabetes, and hypertension syndrome ([place]) 09/23/2020   Other hyperlipidemia 09/23/2020   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 sciatica 03/15/2020   Diabetic peripheral neuropathy ([place]) 12/16/2019   Myalgia 12/16/2019   Transient visual disturbance 10/20/2019   Elevated TSH 10/09/2019   Stage 3a chronic kidney disease ([place]) 10/09/2019   Neuropathic pain 09/15/2019   Elevated LDL cholesterol level 07/10/2019   B12 deficiency 07/10/2019   Iron deficiency 07/10/2019   History of fall within past 90 days 07/10/2019   History of gout 07/10/2019   Skin lesion of left leg 07/10/2019   Frequent falls 06/18/2019   Gait disturbance 06/18/2019   Fall on same level from slipping, tripping, or stumbling  06/09/2019   Lung nodules 05/08/2019   Sleep disturbance 05/01/2019   Fall (on)(from) sidewalk curb, initial encounter 04/18/2019   Vitamin D deficiency 04/18/2019   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   Excessive cerumen in left ear canal 10/29/2018   Trigger finger, right ring finger 08/13/2018   Chest wall muscle strain 07/30/2018   Need for influenza vaccination 07/30/2018   Grieving 06/25/2018   ETD (Eustachian tube dysfunction), left 06/25/2018   Unilateral primary osteoarthritis, right knee 11/17/2016   Complex tear of lateral meniscus of right knee as current injury 10/26/2016   Radiculitis, lumbosacral 06/30/2016   Diabetes ([place]) 01/01/2016   Solitary pulmonary nodule 12/30/2015   Urolithiasis 02/23/2015   Paresthesia 09/07/2014   Routine general medical examination at a health care facility 07/07/2014   Disturbance of skin sensation 12/26/2013   UTI (urinary tract infection) 03/12/2013   Screening for prostate cancer 02/14/2012   Right knee pain 11/15/2011   Encounter for long-term (current) use of other medications 01/17/2011   Special screening examination for neoplasm of prostate 01/17/2011   MYCOSIS FUNGOIDES 12/29/2009   HEARING LOSS 09/30/2009   ECZEMA 09/30/2009   VITAMIN B12 DEFICIENCY 09/10/2009   MORBID OBESITY 09/10/2009   SYNCOPE 35/36/1443   DIASTOLIC DYSFUNCTION 15/40/0867   CARDIAC MURMUR, SYSTOLIC 61/95/0932   Pancytopenia ([place]) 05/12/2008   Disorder resulting from impaired renal function 05/12/2008   EDEMA 05/12/2008   Obstructive sleep apnea 11/04/2007   BACK PAIN, LUMBAR 10/23/2007   Dyslipidemia 05/20/2007   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   Essential hypertension 05/20/2007    Orientation RESPIRATION BLADDER Height & Weight     Self, Time,  Situation, Place  O2 Continent, External catheter Weight: 275 lb 12.7 oz (125.1 kg) Height:  6' (182.9 cm)  BEHAVIORAL SYMPTOMS/MOOD NEUROLOGICAL BOWEL NUTRITION STATUS      Continent    AMBULATORY STATUS COMMUNICATION OF  NEEDS Skin   Extensive Assist Verbally Surgical wounds                       Personal Care Assistance Level of Assistance  Bathing, Dressing Bathing Assistance: Limited assistance   Dressing Assistance: Limited assistance     Functional Limitations Info             SPECIAL CARE FACTORS FREQUENCY  PT (By licensed PT), OT (By licensed OT)     PT Frequency: 5xwl OT Frequency: 5x/wk            Contractures Contractures Info: Not present    Additional Factors Info  Code Status, Allergies, Psychotropic, Insulin Sliding Scale Code Status Info: Full Allergies Info: see MAR Psychotropic Info: see MAR Insulin Sliding Scale Info: see MAR       Current Medications (04/26/2021):  This is the current hospital active medication list Current Facility-Administered Medications  Medication Dose Route Frequency Provider Last Rate Last Admin   0.9 %  sodium chloride infusion   Intravenous Continuous Rod Can, MD 125 mL/hr at 04/26/21 0327 New Bag at 04/26/21 0327   amitriptyline (ELAVIL) tablet 25 mg  25 mg Oral QHS Rod Can, MD   25 mg at 04/25/21 2159   aspirin EC tablet 81 mg  81 mg Oral Daily Rod Can, MD   81 mg at 04/26/21 7062   docusate sodium (COLACE) capsule 100 mg  100 mg Oral BID Rod Can, MD   100 mg at 04/26/21 0842   enoxaparin (LOVENOX) injection 40 mg  40 mg Subcutaneous Q24H Rod Can, MD   40 mg at 04/26/21 0842   feeding supplement (ENSURE SURGERY) liquid 237 mL  237 mL Oral BID BM Donnamae Jude, MD   237 mL at 04/26/21 0851   HYDROcodone-acetaminophen (NORCO/VICODIN) 5-325 MG per tablet 1-2 tablet  1-2 tablet Oral Q6H PRN Rod Can, MD   1 tablet at 04/24/21 1822   HYDROmorphone (DILAUDID) injection 1 mg  1 mg Intravenous Q3H PRN Rod Can, MD   1 mg at 04/26/21 3762   insulin aspart (novoLOG) injection 0-15 Units  0-15 Units Subcutaneous TID WC Dwyane Dee, MD   8 Units at 04/26/21 1235   insulin aspart (novoLOG)  injection 0-5 Units  0-5 Units Subcutaneous QHS Dwyane Dee, MD       insulin glargine (LANTUS) injection 5 Units  5 Units Subcutaneous Daily Dwyane Dee, MD   5 Units at 04/26/21 1108   menthol-cetylpyridinium (CEPACOL) lozenge 3 mg  1 lozenge Oral PRN Swinteck, Aaron Edelman, MD       Or   phenol (CHLORASEPTIC) mouth spray 1 spray  1 spray Mouth/Throat PRN Swinteck, Aaron Edelman, MD       methocarbamol (ROBAXIN) tablet 500 mg  500 mg Oral Q6H PRN Rod Can, MD   500 mg at 04/25/21 2007   Or   methocarbamol (ROBAXIN) 500 mg in dextrose 5 % 50 mL IVPB  500 mg Intravenous Q6H PRN Rod Can, MD   Stopped at 04/25/21 1113   metoCLOPramide (REGLAN) tablet 5-10 mg  5-10 mg Oral Q8H PRN Swinteck, Aaron Edelman, MD       Or   metoCLOPramide (REGLAN) injection 5-10 mg  5-10 mg Intravenous Q8H PRN  Swinteck, Aaron Edelman, MD       ondansetron [facility]) tablet 4 mg  4 mg Oral Q6H PRN Swinteck, Aaron Edelman, MD       Or   ondansetron [facility]) injection 4 mg  4 mg Intravenous Q6H PRN Swinteck, Aaron Edelman, MD       senna (SENOKOT) tablet 8.6 mg  1 tablet Oral BID Rod Can, MD   8.6 mg at 04/26/21 2800     Discharge Medications: Please see discharge summary for a list of discharge medications.  Relevant Imaging Results:  Relevant Lab Results:   Additional Information SS# [id number]  Lennart Pall, LCSW

## 2021-04-26 NOTE — Plan of Care (Signed)
  Problem: Education: Goal: Knowledge of General Education information will improve Description Including pain rating scale, medication(s)/side effects and non-pharmacologic comfort measures Outcome: Progressing   

## 2021-04-26 NOTE — Progress Notes (Addendum)
Progress Note    [name]   TDH:[id number]  DOB: 03/19/1941  DOA: 04/23/2021     3  PCP: Libby Maw, MD  CC: fall at home  Hospital Course: [name] is a 80 y.o. male with medical history significant for DMT2 on insulin pump, HLD, OSA, OA, CKD who presents with complaint of right hip pain after falling. He reports that he reached over the nightstand and was going to adjust his CPAP machine when he knocked a clock onto the floor.  He got up to pick up the clock but he lost his balance and fell over landing on his right hip.   He reported instantaneous pain in the right hip and he was not able to stand up.  He laid on the floor until approximately 5:30 in the morning when his wife found him laying on the floor.  EMS was called and firemen came to the house and were able to lift him get him back into the bed.  At that time he refused transport to the hospital for further evaluation.  He continued to have severe right hip pain was unable to stand up or bear weight on his right leg.  He had increased pain with movement of his right hip or leg.  Reports the pain was a 10 out of 10 at its worst.  States he did not hit his head and he had no loss of consciousness.  He has not had any chest pain, palpitations, cough, shortness of breath.  He denies any urinary symptoms.  Lives with his wife and brother.  He has a 62-pack-year history of smoking but quit in 1984.  Denies alcohol or illicit drug use.  He is a retired Engineer, structural  Interval History:  No events overnight.  Sitting on bedside commode when seen this morning.  Having some expected postop pain but otherwise doing okay.  ROS: Constitutional: negative for chills and fevers, Respiratory: negative for cough, Cardiovascular: negative for chest pain, and Gastrointestinal: negative for abdominal pain  Assessment & Plan: Right femoral neck fracture Status post percutaneous screw fixation with Ortho on 04/25/2021 PT consult; CIR vs SNF  for placement  Can be weightbearing as tolerated with a walker  Plan for DVT prophylaxis is Lovenox while in hospital with transition to 81 mg aspirin twice daily at discharge.   Rhabdomyolysis Initial CK is elevated given IV fluid hydration.  CK and creatinine are trending down.   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   Avoid nephrotoxic agents   Type 2 diabetes mellitus without complication, with long-term current use of insulin Sliding scale insulin for use now.   - continue lantus for now   Obstructive sleep apnea Continue home CPAP as he uses at home.  RT to follow   Hypokalemia Replete and trend   Hypomagnesemia Replete and trend  Old records reviewed in assessment of this patient  Antimicrobials:   DVT prophylaxis: enoxaparin (LOVENOX) injection 40 mg Start: 04/26/21 0800 SCDs Start: 04/25/21 1018   Code Status:   Code Status: Full Code Family Communication:   Disposition Plan: Status is: Inpatient  Remains inpatient appropriate because:Unsafe d/c plan and Inpatient level of care appropriate due to severity of illness  Dispo: The patient is from: Home              Anticipated d/c is to: SNF  Patient currently is not medically stable to d/c.   Difficult to place patient No   Risk of unplanned readmission score: Unplanned Admission- Pilot do not use: 21.7   Objective: Blood pressure 138/65, pulse 98, temperature 99.4 F (37.4 C), temperature source Oral, resp. rate 18, height 6' (1.829 m), weight 125.1 kg, SpO2 95 %.  Examination: General appearance: alert, cooperative, and no distress Head: Normocephalic, without obvious abnormality, atraumatic Eyes:  EOMI Lungs: clear to auscultation bilaterally Heart: regular rate and rhythm and S1, S2 normal Abdomen:  obese, soft, NT, distant bowel sounds Extremities:  left hip dressing in place, soft compartments Skin:  mobility and turgor normal Neurologic: Grossly normal  Consultants:  Ortho  Procedures:    Data Reviewed: I have personally reviewed following labs and imaging studies Results for orders placed or performed during the hospital encounter of 04/23/21 (from the past 24 hour(s))  Glucose, capillary     Status: Abnormal   Collection Time: 04/25/21  6:01 PM  Result Value Ref Range   Glucose-Capillary 221 (H) 70 - 99 mg/dL  Glucose, capillary     Status: Abnormal   Collection Time: 04/25/21 11:30 PM  Result Value Ref Range   Glucose-Capillary 240 (H) 70 - 99 mg/dL  CBC     Status: Abnormal   Collection Time: 04/26/21  3:17 AM  Result Value Ref Range   WBC 6.5 4.0 - 10.5 K/uL   RBC 4.29 4.22 - 5.81 MIL/uL   Hemoglobin 12.9 (L) 13.0 - 17.0 g/dL   HCT 39.4 39.0 - 52.0 %   MCV 91.8 80.0 - 100.0 fL   MCH 30.1 26.0 - 34.0 pg   MCHC 32.7 30.0 - 36.0 g/dL   RDW 14.8 11.5 - 15.5 %   Platelets 90 (L) 150 - 400 K/uL   nRBC 0.0 0.0 - 0.2 %  Basic metabolic panel     Status: Abnormal   Collection Time: 04/26/21  3:17 AM  Result Value Ref Range   Sodium 134 (L) 135 - 145 mmol/L   Potassium 4.0 3.5 - 5.1 mmol/L   Chloride 102 98 - 111 mmol/L   CO2 23 22 - 32 mmol/L   Glucose, Bld 332 (H) 70 - 99 mg/dL   BUN 26 (H) 8 - 23 mg/dL   Creatinine, Ser 1.43 (H) 0.61 - 1.24 mg/dL   Calcium 8.6 (L) 8.9 - 10.3 mg/dL   GFR, Estimated 50 (L) >60 mL/min   Anion gap 9 5 - 15  Glucose, capillary     Status: Abnormal   Collection Time: 04/26/21  6:13 AM  Result Value Ref Range   Glucose-Capillary 276 (H) 70 - 99 mg/dL  Glucose, capillary     Status: Abnormal   Collection Time: 04/26/21 11:34 AM  Result Value Ref Range   Glucose-Capillary 263 (H) 70 - 99 mg/dL    Recent Results (from the past 240 hour(s))  Resp Panel by RT-PCR (Flu A&B, Covid) Nasopharyngeal Swab     Status: None   Collection Time: 04/23/21  8:01 PM   Specimen: Nasopharyngeal Swab; Nasopharyngeal(NP) swabs in vial transport medium   Result Value Ref Range Status   SARS Coronavirus 2 by RT PCR NEGATIVE NEGATIVE Final    Comment: (NOTE) SARS-CoV-2 target nucleic acids are NOT DETECTED.  The SARS-CoV-2 RNA is generally detectable in upper respiratory specimens during the acute phase of infection. The lowest concentration of SARS-CoV-2 viral copies this assay can detect is 138 copies/mL. A negative result does not preclude  SARS-Cov-2 infection and should not be used as the sole basis for treatment or other patient management decisions. A negative result may occur with  improper specimen collection/handling, submission of specimen other than nasopharyngeal swab, presence of viral mutation(s) within the areas targeted by this assay, and inadequate number of viral copies(<138 copies/mL). A negative result must be combined with clinical observations, patient history, and epidemiological information. The expected result is Negative.  Fact Sheet for Patients:  EntrepreneurPulse.com.au  Fact Sheet for Healthcare Providers:  IncredibleEmployment.be  This test is no t yet approved or cleared by the [place] FDA and  has been authorized for detection and/or diagnosis of SARS-CoV-2 by FDA under an Emergency Use Authorization (EUA). This EUA will remain  in effect (meaning this test can be used) for the duration of the COVID-19 declaration under Section 564(b)(1) of the Act, 21 U.S.C.section 360bbb-3(b)(1), unless the authorization is terminated  or revoked sooner.       Influenza A by PCR NEGATIVE NEGATIVE Final   Influenza B by PCR NEGATIVE NEGATIVE Final    Comment: (NOTE) The Xpert Xpress SARS-CoV-2/FLU/RSV plus assay is intended as an aid in the diagnosis of influenza from Nasopharyngeal swab specimens and should not be used as a sole basis for treatment. Nasal washings and aspirates are unacceptable for Xpert Xpress SARS-CoV-2/FLU/RSV testing.  Fact Sheet for  Patients: EntrepreneurPulse.com.au  Fact Sheet for Healthcare Providers: IncredibleEmployment.be  This test is not yet approved or cleared by the [place] FDA and has been authorized for detection and/or diagnosis of SARS-CoV-2 by FDA under an Emergency Use Authorization (EUA). This EUA will remain in effect (meaning this test can be used) for the duration of the COVID-19 declaration under Section 564(b)(1) of the Act, 21 U.S.C. section 360bbb-3(b)(1), unless the authorization is terminated or revoked.  Performed at [facility], [place] [address] [place], [place] [address]   Surgical pcr screen     Status: None   Collection Time: 04/23/21 11:29 PM   Specimen: Nasal Mucosa; Nasal Swab  Result Value Ref Range Status   MRSA, PCR NEGATIVE NEGATIVE Final   Staphylococcus aureus NEGATIVE NEGATIVE Final    Comment: (NOTE) The Xpert SA Assay (FDA approved for NASAL specimens in patients 15 years of age and older), is one component of a comprehensive surveillance program. It is not intended to diagnose infection nor to guide or monitor treatment. Performed at [facility], [place] [address] [place], [place] [address]      Radiology Studies: Pelvis Portable  Result Date: 04/25/2021 CLINICAL DATA:  Hip pending today.  Images done in recovery. EXAM: PORTABLE PELVIS 1-2 VIEWS COMPARISON:  Exam earlier today FINDINGS: Patient has undergone ORIF of RIGHT femoral neck fracture with 3 cannulated screws. Alignment is normal. There is no evidence for dislocation on the frontal view. Abdominal wall laxity. IMPRESSION: ORIF of the RIGHT hip.  No adverse features. Electronically Signed   By: Nolon Nations M.D.   On: 04/25/2021 11:18   DG C-Arm 1-60 Min-No Report  Result Date: 04/25/2021 CLINICAL DATA:  Intraoperative images RIGHT hip. EXAM: OPERATIVE RIGHT HIP (WITH PELVIS IF PERFORMED)  VIEWS TECHNIQUE: Fluoroscopic spot  image(s) were submitted for interpretation post-operatively. COMPARISON:  04/23/2021 and CT on 04/23/2021 FINDINGS: Two images are submitted, showing interval pinning of the RIGHT hip with 3 cannulated screws. There is no dislocation or interval fracture. IMPRESSION: Status post ORIF of the RIGHT hip. Electronically Signed   By: Nolon Nations M.D.  On: 04/25/2021 09:38   DG HIP OPERATIVE UNILAT W OR W/O PELVIS RIGHT  Result Date: 04/25/2021 CLINICAL DATA:  Intraoperative images RIGHT hip. EXAM: OPERATIVE RIGHT HIP (WITH PELVIS IF PERFORMED)  VIEWS TECHNIQUE: Fluoroscopic spot image(s) were submitted for interpretation post-operatively. COMPARISON:  04/23/2021 and CT on 04/23/2021 FINDINGS: Two images are submitted, showing interval pinning of the RIGHT hip with 3 cannulated screws. There is no dislocation or interval fracture. IMPRESSION: Status post ORIF of the RIGHT hip. Electronically Signed   By: Nolon Nations M.D.   On: 04/25/2021 09:38   Pelvis Portable  Final Result    DG C-Arm 1-60 Min-No Report  Final Result    DG HIP OPERATIVE UNILAT W OR W/O PELVIS RIGHT  Final Result    CT Hip Right Wo Contrast  Final Result    DG Hip Unilat W or Wo Pelvis 2-3 Views Right  Final Result    DG Chest Port 1 View  Final Result      Scheduled Meds:  amitriptyline  25 mg Oral QHS   aspirin EC  81 mg Oral Daily   docusate sodium  100 mg Oral BID   enoxaparin (LOVENOX) injection  40 mg Subcutaneous Q24H   feeding supplement  237 mL Oral BID BM   insulin aspart  0-15 Units Subcutaneous TID WC   insulin aspart  0-5 Units Subcutaneous QHS   insulin glargine  5 Units Subcutaneous Daily   senna  1 tablet Oral BID   PRN Meds: HYDROcodone-acetaminophen, HYDROmorphone (DILAUDID) injection, menthol-cetylpyridinium **OR** phenol, methocarbamol **OR** methocarbamol (ROBAXIN) IV, metoCLOPramide **OR** metoCLOPramide (REGLAN) injection, ondansetron **OR** ondansetron (ZOFRAN) IV Continuous  Infusions:  sodium chloride 125 mL/hr at 04/26/21 0327   methocarbamol (ROBAXIN) IV Stopped (04/25/21 1113)     LOS: 3 days  Time spent: Greater than 50% of the 35 minute visit was spent in counseling/coordination of care for the patient as laid out in the A&P.   Dwyane Dee, MD [facility] 04/26/2021, 4:05 PM

## 2021-04-26 NOTE — Plan of Care (Signed)
  Problem: Pain Managment: Goal: General experience of comfort will improve Outcome: Progressing   Problem: Safety: Goal: Ability to remain free from injury will improve Outcome: Progressing   

## 2021-04-26 NOTE — Progress Notes (Signed)
Called [place], [place] to provide update about the patient's confusion and transition of care plan to SNF. Griffith [place], [place] stated that the ortho team will be coming to round on this patient tomorrow. She stated that if there are any concerns about confusion, to notify [facility].

## 2021-04-26 NOTE — Progress Notes (Signed)
Messaged Dr. Sabino Gasser, MD to make him aware of patient's recent CBG of 318. New orders from Dr. Sabino Gasser, MD: He stated he will increase the Lantus dose.

## 2021-04-27 LAB — GLUCOSE, CAPILLARY
Glucose-Capillary: 258 mg/dL — ABNORMAL HIGH (ref 70–99)
Glucose-Capillary: 254 mg/dL — ABNORMAL HIGH (ref 70–99)
Glucose-Capillary: 244 mg/dL — ABNORMAL HIGH (ref 70–99)
Glucose-Capillary: 262 mg/dL — ABNORMAL HIGH (ref 70–99)
Glucose-Capillary: 217 mg/dL — ABNORMAL HIGH (ref 70–99)

## 2021-04-27 LAB — CBC
HCT: 38.6 % — ABNORMAL LOW (ref 39.0–52.0)
Hemoglobin: 12.7 g/dL — ABNORMAL LOW (ref 13.0–17.0)
MCH: 29.5 pg (ref 26.0–34.0)
MCHC: 32.9 g/dL (ref 30.0–36.0)
MCV: 89.6 fL (ref 80.0–100.0)
Platelets: 107 10*3/uL — ABNORMAL LOW (ref 150–400)
RBC: 4.31 MIL/uL (ref 4.22–5.81)
RDW: 14.7 % (ref 11.5–15.5)
WBC: 6.3 10*3/uL (ref 4.0–10.5)
nRBC: 0 % (ref 0.0–0.2)

## 2021-04-27 LAB — BASIC METABOLIC PANEL
Anion gap: 9 (ref 5–15)
BUN: 21 mg/dL (ref 8–23)
CO2: 22 mmol/L (ref 22–32)
Calcium: 8.9 mg/dL (ref 8.9–10.3)
Chloride: 106 mmol/L (ref 98–111)
Creatinine, Ser: 1.24 mg/dL (ref 0.61–1.24)
GFR, Estimated: 59 mL/min — ABNORMAL LOW (ref >60)
Glucose, Bld: 269 mg/dL — ABNORMAL HIGH (ref 70–99)
Potassium: 3.4 mmol/L — ABNORMAL LOW (ref 3.5–5.1)
Sodium: 137 mmol/L (ref 135–145)

## 2021-04-27 LAB — HEMOGLOBIN A1C
Hgb A1c MFr Bld: 7 % — ABNORMAL HIGH (ref 4.8–5.6)
Mean Plasma Glucose: 154 mg/dL

## 2021-04-27 LAB — MAGNESIUM: Magnesium: 1.8 mg/dL (ref 1.7–2.4)

## 2021-04-27 MED ORDER — INSULIN GLARGINE 100 UNIT/ML ~~LOC~~ SOLN
10.0000 [IU] | Freq: Every day | SUBCUTANEOUS | Status: DC
  Administered 2021-04-27: 10 [IU] via SUBCUTANEOUS
  Filled 2021-04-27 (×2): qty 0.1

## 2021-04-27 MED ORDER — HYDROCODONE-ACETAMINOPHEN 5-325 MG PO TABS: 1.0000 | ORAL_TABLET | Freq: Four times a day (QID) | ORAL | 0 refills | Status: DC | PRN

## 2021-04-27 MED ORDER — ASPIRIN 81 MG PO TBEC: 81.0000 mg | DELAYED_RELEASE_TABLET | Freq: Two times a day (BID) | ORAL | 0 refills | Status: AC

## 2021-04-27 NOTE — TOC Progression Note (Signed)
Transition of Care [facility]) - Progression Note    Patient Details  Name: [name] MRN: [id number] Date of Birth: 1941/02/14  Transition [facility] CM/SW Contact  Lennart Pall, LCSW Phone Number: 04/27/2021, 4:03 PM  Clinical Narrative:    Have received SNF bed offer from [facility] and pt/ wife have accepted. Facility can admit tomorrow if pt is medically cleared.  Have begun insurance authorization (ref# [id number]).  Have alerted MD and requested COVID test be ordered.   Expected Discharge Plan: [place] Barriers to Discharge: Continued Medical Work up  Expected Discharge Plan and Services Expected Discharge Plan: [place] In-house Referral: Clinical Social Work Discharge Planning Services: NA Post Acute Care Choice: [place] Living arrangements for the past 2 months: Single Family Home                 DME Arranged: N/A                     Social Determinants of Health (SDOH) Interventions    Readmission Risk Interventions No flowsheet data found.

## 2021-04-27 NOTE — [place] Notes (Signed)
[place] Note    [name]   NLZ:[id number]  DOB: 1940-12-18  DOA: 04/23/2021     4  PCP: Libby Maw, MD  CC: fall at home  Hospital Course: [name] is a 80 y.o. male with medical history significant for DMT2 on insulin pump, HLD, OSA, OA, CKD who presents with complaint of right hip pain after falling. He reports that he reached over the nightstand and was going to adjust his CPAP machine when he knocked a clock onto the floor.  He got up to pick up the clock but he lost his balance and fell over landing on his right hip.   He reported instantaneous pain in the right hip and he was not able to stand up.  He laid on the floor until approximately 5:30 in the morning when his wife found him laying on the floor.  EMS was called and firemen came to the house and were able to lift him get him back into the bed.  At that time he refused transport to the hospital for further evaluation.  He continued to have severe right hip pain was unable to stand up or bear weight on his right leg.  He had increased pain with movement of his right hip or leg.  Reports the pain was a 10 out of 10 at its worst.  States he did not hit his head and he had no loss of consciousness.  He has not had any chest pain, palpitations, cough, shortness of breath.  He denies any urinary symptoms.  Lives with his wife and brother.  He has a 62-pack-year history of smoking but quit in 1984.  Denies alcohol or illicit drug use.  He is a retired Engineer, structural  Interval History:  No events overnight.  Still remains slightly delirious/confused.  Wife bedside this morning.  States this has happened to him before with prior surgeries and hospitalizations as well.  ROS: Constitutional: negative for chills and fevers, Respiratory: negative for cough, Cardiovascular: negative for chest pain, and Gastrointestinal: negative for abdominal pain  Assessment & Plan: Right femoral neck fracture Status post percutaneous screw  fixation with Ortho on 04/25/2021 PT consult; tentative plans for SNF placement Can be weightbearing as tolerated with a walker  Plan for DVT prophylaxis is Lovenox while in hospital with transition to 81 mg aspirin twice daily at discharge.  Delirium - Continue providing reassurance, reorientation, and supportive care - This is expected in setting of his advanced age, setting change, and recent anesthesia -Only if he becomes agitated when he warrants treatment with possible Haldol otherwise for his confusion, he should just be provided supportive care   Rhabdomyolysis Initial CK is elevated given IV fluid hydration.  CK and creatinine are trending down.   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   Avoid nephrotoxic agents   Type 2 diabetes mellitus without complication, with long-term current use of insulin Sliding scale insulin for use now.   - continue lantus for now   Obstructive sleep apnea Continue home CPAP as he uses at home.  RT to follow   Hypokalemia Replete and trend   Hypomagnesemia Replete and trend  Old records reviewed in assessment of this patient  Antimicrobials:   DVT prophylaxis: enoxaparin (LOVENOX) injection 40 mg Start: 04/26/21 0800 SCDs Start: 04/25/21 1018   Code Status:   Code Status: Full Code Family Communication:   Disposition Plan: Status is: Inpatient  Remains inpatient appropriate because:Unsafe d/c plan and Inpatient level of care appropriate due to severity of illness  Dispo: The patient is from: Home              Anticipated d/c is to: SNF              Patient currently is not medically stable to d/c.   Difficult to place patient No   Risk of unplanned readmission score: Unplanned Admission- Pilot do not use: 16.41   Objective: Blood pressure (!) 154/70, pulse 87, temperature 98.8 F (37.1 C), temperature source Oral, resp. rate 18, height 6' (1.829  m), weight 125.1 kg, SpO2 96 %.  Examination: General appearance: alert, cooperative, and no distress Head: Normocephalic, without obvious abnormality, atraumatic Eyes:  EOMI Lungs: clear to auscultation bilaterally Heart: regular rate and rhythm and S1, S2 normal Abdomen:  obese, soft, NT, distant bowel sounds Extremities:  right hip dressing in place, soft compartments Skin: mobility and turgor normal Neurologic: Grossly normal  Consultants:  Ortho  Procedures:    Data Reviewed: I have personally reviewed following labs and imaging studies Results for orders placed or performed during the hospital encounter of 04/23/21 (from the past 24 hour(s))  Glucose, capillary     Status: Abnormal   Collection Time: 04/26/21  4:55 PM  Result Value Ref Range   Glucose-Capillary 318 (H) 70 - 99 mg/dL  Glucose, capillary     Status: Abnormal   Collection Time: 04/26/21  9:59 PM  Result Value Ref Range   Glucose-Capillary 306 (H) 70 - 99 mg/dL  CBC     Status: Abnormal   Collection Time: 04/27/21  3:09 AM  Result Value Ref Range   WBC 6.3 4.0 - 10.5 K/uL   RBC 4.31 4.22 - 5.81 MIL/uL   Hemoglobin 12.7 (L) 13.0 - 17.0 g/dL   HCT 38.6 (L) 39.0 - 52.0 %   MCV 89.6 80.0 - 100.0 fL   MCH 29.5 26.0 - 34.0 pg   MCHC 32.9 30.0 - 36.0 g/dL   RDW 14.7 11.5 - 15.5 %   Platelets 107 (L) 150 - 400 K/uL   nRBC 0.0 0.0 - 0.2 %  Basic metabolic panel     Status: Abnormal   Collection Time: 04/27/21  3:09 AM  Result Value Ref Range   Sodium 137 135 - 145 mmol/L   Potassium 3.4 (L) 3.5 - 5.1 mmol/L   Chloride 106 98 - 111 mmol/L   CO2 22 22 - 32 mmol/L   Glucose, Bld 269 (H) 70 - 99 mg/dL   BUN 21 8 - 23 mg/dL   Creatinine, Ser 1.24 0.61 - 1.24 mg/dL   Calcium 8.9 8.9 - 10.3 mg/dL   GFR, Estimated 59 (L) >60 mL/min   Anion gap 9 5 - 15  Magnesium     Status: None   Collection Time: 04/27/21  3:09 AM  Result Value Ref Range   Magnesium 1.8 1.7 - 2.4 mg/dL  Glucose, capillary     Status:  Abnormal   Collection Time: 04/27/21  6:22 AM  Result Value Ref Range   Glucose-Capillary 258 (H) 70 - 99 mg/dL  Glucose, capillary     Status: Abnormal   Collection Time: 04/27/21  7:40 AM  Result Value Ref Range   Glucose-Capillary 254 (H) 70 - 99 mg/dL  Glucose, capillary     Status: Abnormal   Collection Time: 04/27/21 12:52 PM  Result Value Ref Range   Glucose-Capillary 244 (H)  70 - 99 mg/dL    Recent Results (from the past 240 hour(s))  Resp Panel by RT-PCR (Flu A&B, Covid) Nasopharyngeal Swab     Status: None   Collection Time: 04/23/21  8:01 PM   Specimen: Nasopharyngeal Swab; Nasopharyngeal(NP) swabs in vial transport medium  Result Value Ref Range Status   SARS Coronavirus 2 by RT PCR NEGATIVE NEGATIVE Final    Comment: (NOTE) SARS-CoV-2 target nucleic acids are NOT DETECTED.  The SARS-CoV-2 RNA is generally detectable in upper respiratory specimens during the acute phase of infection. The lowest concentration of SARS-CoV-2 viral copies this assay can detect is 138 copies/mL. A negative result does not preclude SARS-Cov-2 infection and should not be used as the sole basis for treatment or other patient management decisions. A negative result may occur with  improper specimen collection/handling, submission of specimen other than nasopharyngeal swab, presence of viral mutation(s) within the areas targeted by this assay, and inadequate number of viral copies(<138 copies/mL). A negative result must be combined with clinical observations, patient history, and epidemiological information. The expected result is Negative.  Fact Sheet for Patients:  EntrepreneurPulse.com.au  Fact Sheet for Healthcare Providers:  IncredibleEmployment.be  This test is no t yet approved or cleared by the [place] FDA and  has been authorized for detection and/or diagnosis of SARS-CoV-2 by FDA under an Emergency Use Authorization (EUA). This EUA will  remain  in effect (meaning this test can be used) for the duration of the COVID-19 declaration under Section 564(b)(1) of the Act, 21 U.S.C.section 360bbb-3(b)(1), unless the authorization is terminated  or revoked sooner.       Influenza A by PCR NEGATIVE NEGATIVE Final   Influenza B by PCR NEGATIVE NEGATIVE Final    Comment: (NOTE) The Xpert Xpress SARS-CoV-2/FLU/RSV plus assay is intended as an aid in the diagnosis of influenza from Nasopharyngeal swab specimens and should not be used as a sole basis for treatment. Nasal washings and aspirates are unacceptable for Xpert Xpress SARS-CoV-2/FLU/RSV testing.  Fact Sheet for Patients: EntrepreneurPulse.com.au  Fact Sheet for Healthcare Providers: IncredibleEmployment.be  This test is not yet approved or cleared by the [place] FDA and has been authorized for detection and/or diagnosis of SARS-CoV-2 by FDA under an Emergency Use Authorization (EUA). This EUA will remain in effect (meaning this test can be used) for the duration of the COVID-19 declaration under Section 564(b)(1) of the Act, 21 U.S.C. section 360bbb-3(b)(1), unless the authorization is terminated or revoked.  Performed at [facility], [place] [address] [place], [place] [address]   Surgical pcr screen     Status: None   Collection Time: 04/23/21 11:29 PM   Specimen: Nasal Mucosa; Nasal Swab  Result Value Ref Range Status   MRSA, PCR NEGATIVE NEGATIVE Final   Staphylococcus aureus NEGATIVE NEGATIVE Final    Comment: (NOTE) The Xpert SA Assay (FDA approved for NASAL specimens in patients 46 years of age and older), is one component of a comprehensive surveillance program. It is not intended to diagnose infection nor to guide or monitor treatment. Performed at [facility], [place] [address] [place], [place] [address]      Radiology Studies: No results found. Pelvis Portable  Final  Result    DG C-Arm 1-60 Min-No Report  Final Result    DG HIP OPERATIVE UNILAT W OR W/O PELVIS RIGHT  Final Result    CT Hip Right Wo Contrast  Final Result    DG Hip Unilat W  or Wo Pelvis 2-3 Views Right  Final Result    DG Chest Port 1 View  Final Result      Scheduled Meds:  amitriptyline  25 mg Oral QHS   aspirin EC  81 mg Oral Daily   docusate sodium  100 mg Oral BID   enoxaparin (LOVENOX) injection  40 mg Subcutaneous Q24H   feeding supplement  237 mL Oral BID BM   insulin aspart  0-15 Units Subcutaneous TID WC   insulin aspart  0-5 Units Subcutaneous QHS   insulin glargine  10 Units Subcutaneous Daily   senna  1 tablet Oral BID   PRN Meds: HYDROcodone-acetaminophen, HYDROmorphone (DILAUDID) injection, menthol-cetylpyridinium **OR** phenol, methocarbamol **OR** methocarbamol (ROBAXIN) IV, metoCLOPramide **OR** metoCLOPramide (REGLAN) injection, ondansetron **OR** ondansetron (ZOFRAN) IV Continuous Infusions:  sodium chloride 125 mL/hr at 04/26/21 0327   methocarbamol (ROBAXIN) IV Stopped (04/25/21 1113)     LOS: 4 days  Time spent: Greater than 50% of the 35 minute visit was spent in counseling/coordination of care for the patient as laid out in the A&P.   Dwyane Dee, MD [facility] 04/27/2021, 1:41 PM

## 2021-04-27 NOTE — Care Management Important Message (Signed)
Important Message  Patient Details IM Letter placed in Patient's room. Name: [name] MRN: [id number] Date of Birth: 21-Dec-1940   Medicare Important Message Given:  Yes     Kerin Salen 04/27/2021, 11:01 AM

## 2021-04-27 NOTE — [place] Notes (Signed)
    Subjective:  Patient reports pain as mild to moderate.  Denies N/V/CP/SOB.   Objective:   VITALS:   Vitals:   04/26/21 1432 04/26/21 1924 04/26/21 2157 04/27/21 0606  BP: 138/65  (!) 178/78 (!) 176/82  Pulse: 98 98 (!) 105 96  Resp: 18 17 18 18   Temp: 99.4 F (37.4 C)  98.2 F (36.8 C) 97.9 F (36.6 C)  TempSrc: Oral  Oral   SpO2: 95% 93% 92% 90%  Weight:      Height:        NAD ABD soft Neurovascular intact Sensation intact distally Intact pulses distally Dorsiflexion/Plantar flexion intact Incision: dressing C/D/I   Lab Results  Component Value Date   WBC 6.3 04/27/2021   HGB 12.7 (L) 04/27/2021   HCT 38.6 (L) 04/27/2021   MCV 89.6 04/27/2021   PLT 107 (L) 04/27/2021   BMET    Component Value Date/Time   NA 137 04/27/2021 0309   NA 146 (H) 11/17/2020 1000   K 3.4 (L) 04/27/2021 0309   CL 106 04/27/2021 0309   CO2 22 04/27/2021 0309   GLUCOSE 269 (H) 04/27/2021 0309   BUN 21 04/27/2021 0309   BUN 15 11/17/2020 1000   CREATININE 1.24 04/27/2021 0309   CALCIUM 8.9 04/27/2021 0309   GFRNONAA 59 (L) 04/27/2021 0309   GFRAA 46 (L) 11/17/2020 1000     Assessment/Plan: 2 Days Post-Op   Principal Problem:   Closed right hip fracture (HCC) Active Problems:   Obstructive sleep apnea   Essential hypertension   Hypokalemia   Rhabdomyolysis   Acute kidney injury superimposed on CKD (HCC)   Type 2 diabetes mellitus without complication, with long-term current use of insulin (HCC)   WBAT with walker DVT ppx: Aspirin, SCDs, TEDS    ASA 81mg  BID at DC PO pain control PT/OT Dispo: Pending.  Follow up with Dr.Swinteck 2 weeks post discharge for incision check and radiographs     Dorothyann Peng 04/27/2021, 8:45 AM  [facility] is now [employer] [facility] [address] [place], [place], [place] [address] Phone: [phone] [email or web address] Facebook  [employer]

## 2021-04-28 LAB — CBC
HCT: 34.8 % — ABNORMAL LOW (ref 39.0–52.0)
Hemoglobin: 11.7 g/dL — ABNORMAL LOW (ref 13.0–17.0)
MCH: 30.2 pg (ref 26.0–34.0)
MCHC: 33.6 g/dL (ref 30.0–36.0)
MCV: 89.7 fL (ref 80.0–100.0)
Platelets: 128 10*3/uL — ABNORMAL LOW (ref 150–400)
RBC: 3.88 MIL/uL — ABNORMAL LOW (ref 4.22–5.81)
RDW: 15.1 % (ref 11.5–15.5)
WBC: 4.3 10*3/uL (ref 4.0–10.5)
nRBC: 0 % (ref 0.0–0.2)

## 2021-04-28 LAB — RESP PANEL BY RT-PCR (FLU A&B, COVID) ARPGX2
Influenza A by PCR: NEGATIVE
Influenza B by PCR: NEGATIVE
SARS Coronavirus 2 by RT PCR: NEGATIVE

## 2021-04-28 LAB — MAGNESIUM: Magnesium: 1.8 mg/dL (ref 1.7–2.4)

## 2021-04-28 LAB — GLUCOSE, CAPILLARY
Glucose-Capillary: 200 mg/dL — ABNORMAL HIGH (ref 70–99)
Glucose-Capillary: 272 mg/dL — ABNORMAL HIGH (ref 70–99)
Glucose-Capillary: 178 mg/dL — ABNORMAL HIGH (ref 70–99)

## 2021-04-28 MED ORDER — INSULIN GLARGINE 100 UNIT/ML ~~LOC~~ SOLN
14.0000 [IU] | Freq: Every day | SUBCUTANEOUS | Status: DC
  Administered 2021-04-28: 14 [IU] via SUBCUTANEOUS
  Filled 2021-04-28: qty 0.14

## 2021-04-28 NOTE — Discharge Summary (Signed)
Physician Discharge Summary   [name] ZOX:[id number] DOB: 27-Jan-1941 DOA: 04/23/2021  PCP: Libby Maw, MD  Admit date: 04/23/2021 Discharge date: 04/28/2021  Admitted From: home Disposition:  SNF Discharging physician: Dwyane Dee, MD  Recommendations for Outpatient Follow-up:  Follow-up with orthopedic surgery in 2 weeks.  Patient discharged on aspirin 81 mg twice daily If unable to continue patient's home insulin pump at discharge, resume back on Lantus ~14 units daily and sliding scale  Home Health:  Equipment/Devices:   Patient discharged to SNF in Discharge Condition: stable Risk of unplanned readmission score: Unplanned Admission- Pilot do not use: 16.65  CODE STATUS: Full Diet recommendation:  Diet Orders (From admission, onward)     Start     Ordered   04/28/21 0000  Diet Carb Modified        04/28/21 1242   04/25/21 1152  Diet Carb Modified Fluid consistency: Thin; Room service appropriate? Yes  Diet effective now       Question Answer Comment  Calorie Level Medium 1600-2000   Fluid consistency: Thin   Room service appropriate? Yes      04/25/21 [facility]:  [name] is a 80 y.o. male with medical history significant for DMT2 on insulin pump, HLD, OSA, OA, CKD who presented with complaint of right hip pain after falling. He reports that he reached over the nightstand and was going to adjust his CPAP machine when he knocked a clock onto the floor.  He got up to pick up the clock but he lost his balance and fell over landing on his right hip.   He reported instantaneous pain in the right hip and he was not able to stand up.  He laid on the floor until approximately 5:30 in the morning when his wife found him laying on the floor.  EMS was called and firemen came to the house and were able to lift him get him back into the bed.  At that time he refused transport to the hospital for further evaluation.  He continued to have severe  right hip pain was unable to stand up or bear weight on his right leg.  He had increased pain with movement of his right hip or leg.  Reports the pain was a 10 out of 10 at its worst.  States he did not hit his head and he had no loss of consciousness.  He has not had any chest pain, palpitations, cough, shortness of breath.  He denies any urinary symptoms.   Right femoral neck fracture Status post percutaneous screw fixation with Ortho on 04/25/2021 PT consult; plans for SNF placement Can be weightbearing as tolerated with a walker  Plan for 81 mg aspirin twice daily at discharge.   Type 2 diabetes mellitus without complication, with long-term current use of insulin -Treated with Lantus in the hospital with sliding scale insulin also - Discharged back on home insulin pump to SNF.  Delirium -resolved - Continue providing reassurance, reorientation, and supportive care - This is expected in setting of his advanced age, setting change, and recent anesthesia   Rhabdomyolysis Initial CK is elevated given IV fluid hydration.  CK and creatinine are trending down.   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   Avoid nephrotoxic agents   Obstructive sleep apnea Continue home CPAP as he uses at home.  RT to follow  Hypokalemia Repleted   Hypomagnesemia Repleted   The patient's chronic medical conditions were treated accordingly per the patient's home medication regimen except as noted.  On day of discharge, patient was felt deemed stable for discharge. Patient/family member advised to call PCP or come back to ER if needed.   Principal Diagnosis: Closed right hip fracture [name])  Discharge Diagnoses: Active Hospital Problems   Diagnosis Date Noted   Closed right hip fracture ([place]) 04/23/2021   Rhabdomyolysis 04/23/2021   Acute kidney injury superimposed on CKD ([place]) 04/23/2021   Type 2 diabetes mellitus without  complication, with long-term current use of insulin ([name]) 04/23/2021   Hypokalemia 11/17/2020   Obstructive sleep apnea 11/04/2007   Essential hypertension 05/20/2007    Resolved Hospital Problems  No resolved problems to display.    Discharge Instructions     Diet Carb Modified   Complete by: As directed    Discharge wound care:   Complete by: As directed    Continue reinforce dressing   Increase activity slowly   Complete by: As directed       Allergies as of 04/28/2021       Reactions   Atorvastatin Other (See Comments)   unknown   Niacin    REACTION: Severe heartburn   Pioglitazone    REACTION: Edema        Medication List     STOP taking these medications    Insulin Lispro-aabc 100 UNIT/ML Soln   mometasone 0.1 % lotion Commonly known as: ELOCON   Plenvu 140 g Solr Generic drug: PEG-KCl-NaCl-NaSulf-Na Asc-C       TAKE these medications    Accu-Chek Aviva Plus w/Device Kit Use as directed   Accu-Chek FastClix Lancets Misc USE TO CHECK BLOOD SUGAR UP TO 6 TIMES DAILY   alfuzosin 10 MG 24 hr tablet Commonly known as: UROXATRAL TAKE 1 TABLET BY MOUTH  DAILY WITH BREAKFAST   allopurinol 300 MG tablet Commonly known as: ZYLOPRIM TAKE 1 TABLET BY MOUTH  DAILY   amitriptyline 50 MG tablet Commonly known as: ELAVIL Take 25 mg by mouth at bedtime. What changed: Another medication with the same name was removed. Continue taking this medication, and follow the directions you see here.   aspirin 81 MG EC tablet Take 1 tablet (81 mg total) by mouth 2 (two) times daily with a meal. Swallow whole. What changed:  when to take this additional instructions   CENTRUM SILVER PO Take 1 tablet by mouth daily.   cetirizine 10 MG tablet Commonly known as: ZYRTEC Take 10 mg by mouth daily as needed for allergies. What changed: Another medication with the same name was removed. Continue taking this medication, and follow the directions you see here.    Dexcom G6 Sensor Misc Inject 1 Device into the skin as directed. Apply to skin SQ every 10 days   diclofenac Sodium 1 % Gel Commonly known as: Voltaren Apply 2 g topically 4 (four) times daily. What changed:  when to take this reasons to take this   DULoxetine 60 MG capsule Commonly known as: CYMBALTA TAKE 1 CAPSULE BY MOUTH  DAILY   Fish Oil 1200 MG Caps Take 1,200 mg by mouth daily.   fluticasone 50 MCG/ACT nasal spray Commonly known as: FLONASE Place 1 spray into the nose daily as needed for allergies.   folic acid 1 MG tablet Commonly known as: FOLVITE Take 1 mg by mouth daily.   furosemide 20 MG tablet Commonly known as: LASIX Take  20 mg by mouth See admin instructions. Take 1 tablet (20 mg) on MWF   glucose blood test strip Commonly known as: Accu-Chek Aviva Plus USE TO TEST BLOOD SUGAR 6  TIMES PER DAY; E11.9   HumaLOG 100 UNIT/ML injection Generic drug: insulin lispro Inject into the skin. Insulin Pump. 300 units over 72 Hours   HYDROcodone-acetaminophen 5-325 MG tablet Commonly known as: NORCO/VICODIN Take 1-2 tablets by mouth every 6 (six) hours as needed for moderate pain.   omeprazole 20 MG capsule Commonly known as: PRILOSEC Take 1 capsule (20 mg total) by mouth daily as needed. What changed: reasons to take this   rosuvastatin 40 MG tablet Commonly known as: CRESTOR TAKE 1 TABLET BY MOUTH AT  BEDTIME   T: slim X2 Ins Pmp/Control 7.4 Devi by Does not apply route.   T:slim Insulin Cartridge 1m Misc Use with insulin pump, fill once every 2 days.   AutoSoft XC Infusion Set Misc Inject 1 Act into the skin every other day. Use with 936mcannula and 23 inch tubing. *5 boxes (90 day supply)   Trulicity 4.5 MGRP/5.9YVopn Generic drug: Dulaglutide Inject 4.5 mg as directed once a week.   vitamin B-12 1000 MCG tablet Commonly known as: CYANOCOBALAMIN Take 1,000 mcg by mouth daily.               Discharge Care Instructions  (From  admission, onward)           Start     Ordered   04/28/21 0000  Discharge wound care:       Comments: Continue reinforce dressing   04/28/21 1242            Contact information for follow-up providers     [place], BrAaron EdelmanMD. Schedule an appointment as soon as possible for a visit in 2 week(s).   Specialty: Orthopedic Surgery Why: For wound re-check Contact information: [phone]            Contact information for after-discharge care     Destination     [facility]erred [facility]F .   Service: Skilled Nursing Contact information: [address] [phone]                  Allergies  Allergen Reactions   Atorvastatin Other (See Comments)    unknown   Niacin     REACTION: Severe heartburn   Pioglitazone     REACTION: Edema    Consultations: Ortho  Discharge Exam: BP (!) 178/82 (BP Location: Left Arm)   Pulse 82   Temp 98.1 F (36.7 C) (Oral)   Resp 18   Ht 6' (1.829 m)   Wt 125.1 kg   SpO2 94%   BMI 37.40 kg/m  General appearance: alert, cooperative, and no distress Head: Normocephalic, without obvious abnormality, atraumatic Eyes:  EOMI Lungs: clear to auscultation bilaterally Heart: regular rate and rhythm and S1, S2 normal Abdomen:  obese, soft, NT, distant bowel sounds Extremities:  right hip dressing in place, soft compartments Skin: mobility and turgor normal Neurologic: Grossly normal; AO x 4  The results of significant diagnostics from this hospitalization (including imaging, microbiology, ancillary and laboratory) are listed below for reference.   Microbiology: Recent Results (from the past 240 hour(s))  Resp Panel by RT-PCR (Flu A&B, Covid) Nasopharyngeal Swab     Status: None   Collection Time: 04/23/21  8:01 PM  Specimen: Nasopharyngeal Swab; Nasopharyngeal(NP) swabs in vial transport medium  Result Value Ref Range  Status   SARS Coronavirus 2 by RT PCR NEGATIVE NEGATIVE Final    Comment: (NOTE) SARS-CoV-2 target nucleic acids are NOT DETECTED.  The SARS-CoV-2 RNA is generally detectable in upper respiratory specimens during the acute phase of infection. The lowest concentration of SARS-CoV-2 viral copies this assay can detect is 138 copies/mL. A negative result does not preclude SARS-Cov-2 infection and should not be used as the sole basis for treatment or other patient management decisions. A negative result may occur with  improper specimen collection/handling, submission of specimen other than nasopharyngeal swab, presence of viral mutation(s) within the areas targeted by this assay, and inadequate number of viral copies(<138 copies/mL). A negative result must be combined with clinical observations, patient history, and epidemiological information. The expected result is Negative.  Fact Sheet for Patients:  EntrepreneurPulse.com.au  Fact Sheet for Healthcare Providers:  IncredibleEmployment.be  This test is no t yet approved or cleared by the [place] FDA and  has been authorized for detection and/or diagnosis of SARS-CoV-2 by FDA under an Emergency Use Authorization (EUA). This EUA will remain  in effect (meaning this test can be used) for the duration of the COVID-19 declaration under Section 564(b)(1) of the Act, 21 U.S.C.section 360bbb-3(b)(1), unless the authorization is terminated  or revoked sooner.       Influenza A by PCR NEGATIVE NEGATIVE Final   Influenza B by PCR NEGATIVE NEGATIVE Final    Comment: (NOTE) The Xpert Xpress SARS-CoV-2/FLU/RSV plus assay is intended as an aid in the diagnosis of influenza from Nasopharyngeal swab specimens and should not be used as a sole basis for treatment. Nasal washings and aspirates are unacceptable for Xpert Xpress SARS-CoV-2/FLU/RSV testing.  Fact Sheet for  Patients: EntrepreneurPulse.com.au  Fact Sheet for Healthcare Providers: IncredibleEmployment.be  This test is not yet approved or cleared by the [place] FDA and has been authorized for detection and/or diagnosis of SARS-CoV-2 by FDA under an Emergency Use Authorization (EUA). This EUA will remain in effect (meaning this test can be used) for the duration of the COVID-19 declaration under Section 564(b)(1) of the Act, 21 U.S.C. section 360bbb-3(b)(1), unless the authorization is terminated or revoked.  Performed at [facility], [place] [address] [place], [place] [address]   Surgical pcr screen     Status: None   Collection Time: 04/23/21 11:29 PM   Specimen: Nasal Mucosa; Nasal Swab  Result Value Ref Range Status   MRSA, PCR NEGATIVE NEGATIVE Final   Staphylococcus aureus NEGATIVE NEGATIVE Final    Comment: (NOTE) The Xpert SA Assay (FDA approved for NASAL specimens in patients 27 years of age and older), is one component of a comprehensive surveillance program. It is not intended to diagnose infection nor to guide or monitor treatment. Performed at [facility], [place] [address] [place], [place] [address]   Resp Panel by RT-PCR (Flu A&B, Covid) Nasopharyngeal Swab     Status: None   Collection Time: 04/28/21  9:36 AM   Specimen: Nasopharyngeal Swab; Nasopharyngeal(NP) swabs in vial transport medium  Result Value Ref Range Status   SARS Coronavirus 2 by RT PCR NEGATIVE NEGATIVE Final    Comment: (NOTE) SARS-CoV-2 target nucleic acids are NOT DETECTED.  The SARS-CoV-2 RNA is generally detectable in upper respiratory specimens during the acute phase of infection. The lowest concentration of SARS-CoV-2 viral copies this assay can detect is 138 copies/mL. A negative result  does not preclude SARS-Cov-2 infection and should not be used as the sole basis for treatment or other patient management  decisions. A negative result may occur with  improper specimen collection/handling, submission of specimen other than nasopharyngeal swab, presence of viral mutation(s) within the areas targeted by this assay, and inadequate number of viral copies(<138 copies/mL). A negative result must be combined with clinical observations, patient history, and epidemiological information. The expected result is Negative.  Fact Sheet for Patients:  EntrepreneurPulse.com.au  Fact Sheet for Healthcare Providers:  IncredibleEmployment.be  This test is no t yet approved or cleared by the [place] FDA and  has been authorized for detection and/or diagnosis of SARS-CoV-2 by FDA under an Emergency Use Authorization (EUA). This EUA will remain  in effect (meaning this test can be used) for the duration of the COVID-19 declaration under Section 564(b)(1) of the Act, 21 U.S.C.section 360bbb-3(b)(1), unless the authorization is terminated  or revoked sooner.       Influenza A by PCR NEGATIVE NEGATIVE Final   Influenza B by PCR NEGATIVE NEGATIVE Final    Comment: (NOTE) The Xpert Xpress SARS-CoV-2/FLU/RSV plus assay is intended as an aid in the diagnosis of influenza from Nasopharyngeal swab specimens and should not be used as a sole basis for treatment. Nasal washings and aspirates are unacceptable for Xpert Xpress SARS-CoV-2/FLU/RSV testing.  Fact Sheet for Patients: EntrepreneurPulse.com.au  Fact Sheet for Healthcare Providers: IncredibleEmployment.be  This test is not yet approved or cleared by the [place] FDA and has been authorized for detection and/or diagnosis of SARS-CoV-2 by FDA under an Emergency Use Authorization (EUA). This EUA will remain in effect (meaning this test can be used) for the duration of the COVID-19 declaration under Section 564(b)(1) of the Act, 21 U.S.C. section 360bbb-3(b)(1), unless the  authorization is terminated or revoked.  Performed at [facility], [place] [address] [place], [place] [address]      Labs: BNP (last 3 results) No results for input(s): BNP in the last 8760 hours. Basic Metabolic Panel: Recent Labs  Lab 04/24/21 0303 04/24/21 1843 04/25/21 1000 04/26/21 0317 04/27/21 0309 04/28/21 0322  NA 132* 133* 134* 134* 137  --   K 3.6 3.7 3.3* 4.0 3.4*  --   CL 99 98 102 102 106  --   CO2 24 25 21* 23 22  --   GLUCOSE 138* 236* 202* 332* 269*  --   BUN 30* 33* 31* 26* 21  --   CREATININE 1.86* 1.76* 1.51* 1.43* 1.24  --   CALCIUM 8.4* 8.7* 8.4* 8.6* 8.9  --   MG 1.5*  --   --   --  1.8 1.8   Liver Function Tests: No results for input(s): AST, ALT, ALKPHOS, BILITOT, PROT, ALBUMIN in the last 168 hours. No results for input(s): LIPASE, AMYLASE in the last 168 hours. No results for input(s): AMMONIA in the last 168 hours. CBC: Recent Labs  Lab 04/23/21 1842 04/25/21 0332 04/26/21 0317 04/27/21 0309 04/28/21 0322  WBC 10.0 7.0 6.5 6.3 4.3  NEUTROABS 8.9* 5.7  --   --   --   HGB 13.7 11.9* 12.9* 12.7* 11.7*  HCT 40.3 35.3* 39.4 38.6* 34.8*  MCV 89.8 90.1 91.8 89.6 89.7  PLT 101* 86* 90* 107* 128*   Cardiac Enzymes: Recent Labs  Lab 04/23/21 1842 04/24/21 0303  CKTOTAL 1,208* 1,155*   BNP: Invalid input(s): POCBNP CBG: Recent Labs  Lab 04/27/21 1252 04/27/21 1650 04/27/21 2230 04/28/21 0741  04/28/21 1131  GLUCAP 244* 262* 217* 200* 272*   D-Dimer No results for input(s): DDIMER in the last 72 hours. Hgb A1c Recent Labs    04/26/21 0317  HGBA1C 7.0*   Lipid Profile No results for input(s): CHOL, HDL, LDLCALC, TRIG, CHOLHDL, LDLDIRECT in the last 72 hours. Thyroid function studies No results for input(s): TSH, T4TOTAL, T3FREE, THYROIDAB in the last 72 hours.  Invalid input(s): FREET3 Anemia work up No results for input(s): VITAMINB12, FOLATE, FERRITIN, TIBC, IRON, RETICCTPCT in the last 72  hours. Urinalysis    Component Value Date/Time   COLORURINE AMBER (A) 04/23/2021 1834   APPEARANCEUR HAZY (A) 04/23/2021 1834   LABSPEC 1.018 04/23/2021 1834   PHURINE 5.0 04/23/2021 1834   GLUCOSEU NEGATIVE 04/23/2021 1834   GLUCOSEU NEGATIVE 02/28/2021 0953   HGBUR NEGATIVE 04/23/2021 1834   BILIRUBINUR NEGATIVE 04/23/2021 1834   BILIRUBINUR negative 09/21/2020 1408   KETONESUR NEGATIVE 04/23/2021 1834   PROTEINUR 30 (A) 04/23/2021 1834   UROBILINOGEN 2.0 (A) 02/28/2021 0953   NITRITE NEGATIVE 04/23/2021 1834   LEUKOCYTESUR MODERATE (A) 04/23/2021 1834   Sepsis Labs Invalid input(s): PROCALCITONIN,  WBC,  LACTICIDVEN Microbiology Recent Results (from the past 240 hour(s))  Resp Panel by RT-PCR (Flu A&B, Covid) Nasopharyngeal Swab     Status: None   Collection Time: 04/23/21  8:01 PM   Specimen: Nasopharyngeal Swab; Nasopharyngeal(NP) swabs in vial transport medium  Result Value Ref Range Status   SARS Coronavirus 2 by RT PCR NEGATIVE NEGATIVE Final    Comment: (NOTE) SARS-CoV-2 target nucleic acids are NOT DETECTED.  The SARS-CoV-2 RNA is generally detectable in upper respiratory specimens during the acute phase of infection. The lowest concentration of SARS-CoV-2 viral copies this assay can detect is 138 copies/mL. A negative result does not preclude SARS-Cov-2 infection and should not be used as the sole basis for treatment or other patient management decisions. A negative result may occur with  improper specimen collection/handling, submission of specimen other than nasopharyngeal swab, presence of viral mutation(s) within the areas targeted by this assay, and inadequate number of viral copies(<138 copies/mL). A negative result must be combined with clinical observations, patient history, and epidemiological information. The expected result is Negative.  Fact Sheet for Patients:  EntrepreneurPulse.com.au  Fact Sheet for Healthcare Providers:   IncredibleEmployment.be  This test is no t yet approved or cleared by the [place] FDA and  has been authorized for detection and/or diagnosis of SARS-CoV-2 by FDA under an Emergency Use Authorization (EUA). This EUA will remain  in effect (meaning this test can be used) for the duration of the COVID-19 declaration under Section 564(b)(1) of the Act, 21 U.S.C.section 360bbb-3(b)(1), unless the authorization is terminated  or revoked sooner.       Influenza A by PCR NEGATIVE NEGATIVE Final   Influenza B by PCR NEGATIVE NEGATIVE Final    Comment: (NOTE) The Xpert Xpress SARS-CoV-2/FLU/RSV plus assay is intended as an aid in the diagnosis of influenza from Nasopharyngeal swab specimens and should not be used as a sole basis for treatment. Nasal washings and aspirates are unacceptable for Xpert Xpress SARS-CoV-2/FLU/RSV testing.  Fact Sheet for Patients: EntrepreneurPulse.com.au  Fact Sheet for Healthcare Providers: IncredibleEmployment.be  This test is not yet approved or cleared by the [place] FDA and has been authorized for detection and/or diagnosis of SARS-CoV-2 by FDA under an Emergency Use Authorization (EUA). This EUA will remain in effect (meaning this test can be used) for the duration of the COVID-19  declaration under Section 564(b)(1) of the Act, 21 U.S.C. section 360bbb-3(b)(1), unless the authorization is terminated or revoked.  Performed at [facility], [place] [address], [place], [place] [address]   Surgical pcr screen     Status: None   Collection Time: 04/23/21 11:29 PM   Specimen: Nasal Mucosa; Nasal Swab  Result Value Ref Range Status   MRSA, PCR NEGATIVE NEGATIVE Final   Staphylococcus aureus NEGATIVE NEGATIVE Final    Comment: (NOTE) The Xpert SA Assay (FDA approved for NASAL specimens in patients 68 years of age and older), is one component of a  comprehensive surveillance program. It is not intended to diagnose infection nor to guide or monitor treatment. Performed at [facility], [place] [address], [place], [place] [address]   Resp Panel by RT-PCR (Flu A&B, Covid) Nasopharyngeal Swab     Status: None   Collection Time: 04/28/21  9:36 AM   Specimen: Nasopharyngeal Swab; Nasopharyngeal(NP) swabs in vial transport medium  Result Value Ref Range Status   SARS Coronavirus 2 by RT PCR NEGATIVE NEGATIVE Final    Comment: (NOTE) SARS-CoV-2 target nucleic acids are NOT DETECTED.  The SARS-CoV-2 RNA is generally detectable in upper respiratory specimens during the acute phase of infection. The lowest concentration of SARS-CoV-2 viral copies this assay can detect is 138 copies/mL. A negative result does not preclude SARS-Cov-2 infection and should not be used as the sole basis for treatment or other patient management decisions. A negative result may occur with  improper specimen collection/handling, submission of specimen other than nasopharyngeal swab, presence of viral mutation(s) within the areas targeted by this assay, and inadequate number of viral copies(<138 copies/mL). A negative result must be combined with clinical observations, patient history, and epidemiological information. The expected result is Negative.  Fact Sheet for Patients:  EntrepreneurPulse.com.au  Fact Sheet for Healthcare Providers:  IncredibleEmployment.be  This test is no t yet approved or cleared by the [place] FDA and  has been authorized for detection and/or diagnosis of SARS-CoV-2 by FDA under an Emergency Use Authorization (EUA). This EUA will remain  in effect (meaning this test can be used) for the duration of the COVID-19 declaration under Section 564(b)(1) of the Act, 21 U.S.C.section 360bbb-3(b)(1), unless the authorization is terminated  or revoked sooner.       Influenza A by  PCR NEGATIVE NEGATIVE Final   Influenza B by PCR NEGATIVE NEGATIVE Final    Comment: (NOTE) The Xpert Xpress SARS-CoV-2/FLU/RSV plus assay is intended as an aid in the diagnosis of influenza from Nasopharyngeal swab specimens and should not be used as a sole basis for treatment. Nasal washings and aspirates are unacceptable for Xpert Xpress SARS-CoV-2/FLU/RSV testing.  Fact Sheet for Patients: EntrepreneurPulse.com.au  Fact Sheet for Healthcare Providers: IncredibleEmployment.be  This test is not yet approved or cleared by the [place] FDA and has been authorized for detection and/or diagnosis of SARS-CoV-2 by FDA under an Emergency Use Authorization (EUA). This EUA will remain in effect (meaning this test can be used) for the duration of the COVID-19 declaration under Section 564(b)(1) of the Act, 21 U.S.C. section 360bbb-3(b)(1), unless the authorization is terminated or revoked.  Performed at [facility], [place] [address], [place], [place] [address]     Procedures/Studies: Pelvis Portable  Result Date: 04/25/2021 CLINICAL DATA:  Hip pending today.  Images done in recovery. EXAM: PORTABLE PELVIS 1-2 VIEWS COMPARISON:  Exam earlier today FINDINGS: Patient has undergone ORIF of RIGHT femoral  neck fracture with 3 cannulated screws. Alignment is normal. There is no evidence for dislocation on the frontal view. Abdominal wall laxity. IMPRESSION: ORIF of the RIGHT hip.  No adverse features. Electronically Signed   By: Nolon Nations M.D.   On: 04/25/2021 11:18   CT Hip Right Wo Contrast  Result Date: 04/23/2021 CLINICAL DATA:  Hip trauma, fracture suspected, neg xray Fall last night with right hip pain. EXAM: CT OF THE RIGHT HIP WITHOUT CONTRAST TECHNIQUE: Multidetector CT imaging of the right hip was performed according to the standard protocol. Multiplanar CT image reconstructions were also generated. COMPARISON:  Radiograph  earlier today. FINDINGS: Bones/Joint/Cartilage Slight cortical step-off of the femoral head neck junction, best appreciated on coronal series 7, images 35-37, suspicious for incomplete nondisplaced fracture. Femoral head is well seated in the acetabulum. No acetabular or pubic rami fractures. There is mild acetabular spurring. Ligaments Suboptimally assessed by CT. Muscles and Tendons No confluent muscle hematoma or muscle atrophy. There is an intramuscular lipoma within the obturator external muscle. Soft tissues Prominent prostate gland. IMPRESSION: Slight cortical step-off of the lateral femoral head neck junction, suspicious for incomplete nondisplaced femoral neck fracture. Electronically Signed   By: Keith Rake M.D.   On: 04/23/2021 20:50   DG Chest Port 1 View  Result Date: 04/23/2021 CLINICAL DATA:  Acute pain following fall. EXAM: PORTABLE CHEST 1 VIEW COMPARISON:  None. FINDINGS: Increased density overlying the UPPER LEFT lung may be related to the end of the LEFT 1st rib. The cardiomediastinal silhouette is unremarkable. There is no evidence of focal airspace disease, pulmonary edema, pleural effusion, or pneumothorax. No acute bony abnormalities are identified. IMPRESSION: Increased density overlying the UPPER LEFT lung which may be related to the end of the LEFT 1st rib. Recommend PA and lateral chest x-ray for further evaluation. No other significant abnormality. Electronically Signed   By: Margarette Canada M.D.   On: 04/23/2021 19:53   DG C-Arm 1-60 Min-No Report  Result Date: 04/25/2021 CLINICAL DATA:  Intraoperative images RIGHT hip. EXAM: OPERATIVE RIGHT HIP (WITH PELVIS IF PERFORMED)  VIEWS TECHNIQUE: Fluoroscopic spot image(s) were submitted for interpretation post-operatively. COMPARISON:  04/23/2021 and CT on 04/23/2021 FINDINGS: Two images are submitted, showing interval pinning of the RIGHT hip with 3 cannulated screws. There is no dislocation or interval fracture. IMPRESSION: Status  post ORIF of the RIGHT hip. Electronically Signed   By: Nolon Nations M.D.   On: 04/25/2021 09:38   DG HIP OPERATIVE UNILAT W OR W/O PELVIS RIGHT  Result Date: 04/25/2021 CLINICAL DATA:  Intraoperative images RIGHT hip. EXAM: OPERATIVE RIGHT HIP (WITH PELVIS IF PERFORMED)  VIEWS TECHNIQUE: Fluoroscopic spot image(s) were submitted for interpretation post-operatively. COMPARISON:  04/23/2021 and CT on 04/23/2021 FINDINGS: Two images are submitted, showing interval pinning of the RIGHT hip with 3 cannulated screws. There is no dislocation or interval fracture. IMPRESSION: Status post ORIF of the RIGHT hip. Electronically Signed   By: Nolon Nations M.D.   On: 04/25/2021 09:38   DG Hip Unilat W or Wo Pelvis 2-3 Views Right  Result Date: 04/23/2021 CLINICAL DATA:  Acute RIGHT hip pain following fall yesterday. Initial encounter. EXAM: DG HIP (WITH OR WITHOUT PELVIS) 2-3V RIGHT COMPARISON:  None. FINDINGS: There is no evidence of hip fracture or dislocation. There is no evidence of arthropathy or other focal bone abnormality. IMPRESSION: Negative. Electronically Signed   By: Margarette Canada M.D.   On: 04/23/2021 19:50     Time coordinating discharge: Over 30  minutes    Dwyane Dee, MD  [facility] 04/28/2021, 12:42 PM

## 2021-04-28 NOTE — TOC Transition Note (Signed)
Transition of Care [facility]) - CM/SW Discharge Note   Patient Details  Name: [name] MRN: [id number] Date of Birth: Oct 25, 1940  Transition of Care [facility]) CM/SW Contact:  Lennart Pall, LCSW Phone Number: 04/28/2021, 1:10 PM   Clinical Narrative:    Have received insurance authorization and pt medically cleared for dc to [facility] today.  PTAR called at 1310.  RN to call report to [phone].  Pt and wife in agreement.  No further TOC needs.   Final next level of care: Skilled Nursing Facility Barriers to Discharge: Barriers Resolved   Patient Goals and CMS Choice Patient states their goals for this hospitalization and ongoing recovery are:: return home following rehab CMS Medicare.gov Compare Post Acute Care list provided to:: Patient Choice offered to / list presented to : Patient  Discharge Placement   Existing PASRR number confirmed : 04/26/21          Patient [facility] bed at: [facility] Patient to be transferred to facility by: [place] Name of family member notified: spouse Patient and family notified of of transfer: 04/28/21  Discharge Plan and Services In-house Referral: Clinical Social Work Discharge Planning Services: NA Post Acute Care Choice: [place]          DME Arranged: N/A                    Social Determinants of Health (SDOH) Interventions     Readmission Risk Interventions No flowsheet data found.

## 2021-04-28 NOTE — Progress Notes (Signed)
Inpatient Diabetes Program Recommendations  AACE/ADA: New Consensus Statement on Inpatient Glycemic Control (2015)  Target Ranges:  Prepandial:   less than 140 mg/dL      Peak postprandial:   less than 180 mg/dL (1-2 hours)      Critically ill patients:  140 - 180 mg/dL   Lab Results  Component Value Date   GLUCAP 200 (H) 04/28/2021   HGBA1C 7.0 (H) 04/26/2021    Review of Glycemic Control  Diabetes history: DM2 Outpatient Diabetes medications: Insulin pump Current orders for Inpatient glycemic control: Lantus 14 units QD, Novolog 0-15 units TID with meals and 0-5 HS  HgbA1C - 7% Post-prandials elevated Lantus increased today  Inpatient Diabetes Program Recommendations:    Consider adding Novolog 3-4 units TID with meals if eating > 50%  Pt will likely d/c to SNF for rehab prior to going back home.   Continue to follow.  Thank you. Lorenda Peck, RD, LDN, CDE Inpatient Diabetes Coordinator [phone]

## 2021-04-28 NOTE — Progress Notes (Signed)
Physical Therapy Treatment Patient Details Name: [name] MRN: [id number] DOB: Jul 30, 1941 Today's Date: 04/28/2021    History of Present Illness 80 y.o. male admitted 04/23/21 with right femoral neck fracture and s/p percutaneous screw fixation 04/25/21. Past medical history significant for DMT2 on insulin pump, HLD, OSA, OA, CKD    PT Comments    Pt's cognition much improved today.  Pt able to ambulate short distance in hallway and reports little pain.  Continue to recommend SNF upon d/c at this time.  Pt and spouse anticipate d/c possibly today.    Follow Up Recommendations  SNF     Equipment Recommendations  Rolling walker with 5" wheels;3in1 (PT)    Recommendations for Other Services       Precautions / Restrictions Precautions Precautions: Fall Restrictions RLE Weight Bearing: Weight bearing as tolerated Other Position/Activity Restrictions: WBAT with walker    Mobility  Bed Mobility Overal bed mobility: Needs Assistance Bed Mobility: Supine to Sit     Supine to sit: Min assist     General bed mobility comments: assist for untangling linen and lines    Transfers Overall transfer level: Needs assistance Equipment used: Rolling walker (2 wheeled) Transfers: Sit to/from Stand Sit to Stand: Min assist         General transfer comment: verbal cues for safe technique, assist to rise and steady as well as control descent  Ambulation/Gait Ambulation/Gait assistance: Min guard Gait Distance (Feet): 80 Feet Assistive device: Rolling walker (2 wheeled) Gait Pattern/deviations: Step-to pattern;Decreased stance time - right;Antalgic     General Gait Details: verbal cues for sequence, safety, use of RW, step length   Stairs             Wheelchair Mobility    Modified Rankin (Stroke Patients Only)       Balance                                            Cognition Arousal/Alertness: Awake/alert Behavior During Therapy: WFL for  tasks assessed/performed Overall Cognitive Status: Within Functional Limits for tasks assessed                                        Exercises      General Comments        Pertinent Vitals/Pain Pain Assessment: Faces Faces Pain Scale: Hurts a little bit Pain Location: right hip Pain Descriptors / Indicators: Grimacing;Guarding Pain Intervention(s): Repositioned;Monitored during session    Home Living                      Prior Function            PT Goals (current goals can now be found in the care plan section) Progress towards PT goals: Progressing toward goals    Frequency    Min 3X/week      PT Plan Current plan remains appropriate    Co-evaluation              AM-PAC PT "6 Clicks" Mobility   Outcome Measure  Help needed turning from your back to your side while in a flat bed without using bedrails?: A Little Help needed moving from lying on your back to sitting on the side of a flat bed without using  bedrails?: A Little Help needed moving to and from a bed to a chair (including a wheelchair)?: A Little Help needed standing up from a chair using your arms (e.g., wheelchair or bedside chair)?: A Little Help needed to walk in hospital room?: A Little Help needed climbing 3-5 steps with a railing? : A Lot 6 Click Score: 17    End of Session Equipment Utilized During Treatment: Gait belt Activity Tolerance: Patient tolerated treatment well Patient left: in chair;with call bell/phone within reach;with family/visitor present;with chair alarm set   PT Visit Diagnosis: Other abnormalities of gait and mobility (R26.89)     Time: 4235-3614 PT Time Calculation (min) (ACUTE ONLY): 16 min  Charges:  $Gait Training: 8-22 mins                     Arlyce Dice, DPT Acute Rehabilitation Services Pager: [phone] Office: [place] 04/28/2021, 12:28 PM

## 2021-05-09 ENCOUNTER — Ambulatory Visit: Payer: Medicare Other | Admitting: Internal Medicine

## 2021-05-11 ENCOUNTER — Other Ambulatory Visit: Payer: Self-pay | Admitting: Endocrinology

## 2021-05-12 ENCOUNTER — Encounter: Payer: Medicare Other | Attending: Physical Medicine & Rehabilitation | Admitting: Physical Medicine and Rehabilitation

## 2021-05-12 ENCOUNTER — Encounter: Payer: Self-pay | Admitting: Physical Medicine and Rehabilitation

## 2021-05-12 ENCOUNTER — Other Ambulatory Visit: Payer: Self-pay

## 2021-05-12 VITALS — BP 129/79 | HR 90 | Temp 97.9°F | Ht 72.0 in | Wt 262.0 lb

## 2021-05-12 DIAGNOSIS — M545 Low back pain, unspecified: Secondary | ICD-10-CM | POA: Diagnosis present

## 2021-05-12 DIAGNOSIS — G8929 Other chronic pain: Secondary | ICD-10-CM | POA: Diagnosis present

## 2021-05-12 NOTE — Progress Notes (Signed)
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     Informed consent was obtained after discussing risks and benefits of the procedure with patient these include bleeding, bruising, and infection.  The patient elects to proceed and has given written consent.   The patient was brought into the procedure room and placed in the prone position.    The right L5 paraspinal area was cleaned and prepped with povidone-iodine with a wide margin and allowed to dry.  Sterile drape was placed with fenestration over the target site.  The peripheral neurostimulator was placed outside of the sterile field over clean skin on upper back of the affected side.    The multifidus muscle was visualized using ultrasound.   The skin overlying the lead site was anesthetized with 4cc of 1% Lidocaine. Care was taken to assure that local anesthetic was not administered close to the target neurostimulator lead site to prevent an altered response to stimulation.    Test stimulation was delivered via a 17 gauge percutaneous sleeve and 19 gauge stimulating probe inside the sleeve to assist in identifying the optimal lead location.  The right lower lumbar multifidus muscle was targeted.   Once this point was located, the stimulating probe was inserted, using ultrasound guidance, at the marked point above using sterile technique.  The probe was advanced until bone was felt and then slightly retracted.      A provided test cable was connected to the probe and the intensity was adjusted until comfortable muscle contraction of the multifidus produced by the neurostimulator was detected by the patient. Once optimal location was identified, stimulation was turned off, the test cable was disconnected from the stimulating probe, and the stimulating probe was removed from the sleeve.   The 20 gauge Microlead Introducer containing the lead was inserted into the  percutaneous sleeve and advanced to the established target depth. The connector box was connected to the de-inuslated end of the lead and the cable was connected to the Stimulator. Optimized stimulation parameters were tested again beginning at slightly lower intensity. Intensity was adjusted until the desired response was obtained, while visualizing multifidus contractions. The location and depth of the electrode was noted. Stimulation was turned off. The connector box was  disconnected from the microlead, and the introducer and sleeve were then withdrawn while manual pressure was applied at the exit site at which point the lead was deployed, secured and implanted. A drop of dermabond was placed at the electrode exit site.   The connector box was reconnected and stimulation was again delivered to confirm that the lead did not move upon removal of the introducer. The lead position was confirmed by the presence of comfortable sensations in the painful areas. The lead was coiled to allow for strain relief then threaded through and secured into the connector box and secured in a cradle.  Excess lead was trimmed to length. The cable was inserted into the neurostimulator and the neurostimulator was positioned on the anterior thigh below the lead exit site at which point the final stimulation response again confirmed optimal lead placement.    The area, including the lead, connecting box and cable, was dressed with occlusive dressing and the patient and caregiver were instructed on the proper management of the site and how to operate the External Pulse Generator (EPG) and the hand held patient programmer.    Follow up scheduled

## 2021-05-18 ENCOUNTER — Ambulatory Visit (HOSPITAL_COMMUNITY): Payer: Medicare Other | Attending: Internal Medicine

## 2021-05-18 ENCOUNTER — Other Ambulatory Visit: Payer: Self-pay

## 2021-05-18 DIAGNOSIS — I712 Thoracic aortic aneurysm, without rupture: Secondary | ICD-10-CM | POA: Diagnosis not present

## 2021-05-18 DIAGNOSIS — I7121 Aneurysm of the ascending aorta, without rupture: Secondary | ICD-10-CM

## 2021-05-18 LAB — ECHOCARDIOGRAM COMPLETE
Area-P 1/2: 3.76 cm2
S' Lateral: 2.6 cm

## 2021-05-18 MED ORDER — PERFLUTREN LIPID MICROSPHERE
1.0000 mL | INTRAVENOUS | Status: AC | PRN
  Administered 2021-05-18: 2 mL via INTRAVENOUS

## 2021-05-19 ENCOUNTER — Encounter: Payer: Self-pay | Admitting: Family Medicine

## 2021-05-19 ENCOUNTER — Ambulatory Visit: Payer: Medicare Other | Admitting: Family Medicine

## 2021-05-19 VITALS — BP 124/66 | HR 94 | Temp 97.5°F | Ht 72.0 in | Wt 261.0 lb

## 2021-05-19 DIAGNOSIS — M810 Age-related osteoporosis without current pathological fracture: Secondary | ICD-10-CM

## 2021-05-19 DIAGNOSIS — R82998 Other abnormal findings in urine: Secondary | ICD-10-CM | POA: Insufficient documentation

## 2021-05-19 DIAGNOSIS — E860 Dehydration: Secondary | ICD-10-CM | POA: Diagnosis not present

## 2021-05-19 DIAGNOSIS — E559 Vitamin D deficiency, unspecified: Secondary | ICD-10-CM

## 2021-05-19 DIAGNOSIS — S72001S Fracture of unspecified part of neck of right femur, sequela: Secondary | ICD-10-CM

## 2021-05-19 DIAGNOSIS — M80059D Age-related osteoporosis with current pathological fracture, unspecified femur, subsequent encounter for fracture with routine healing: Secondary | ICD-10-CM

## 2021-05-19 DIAGNOSIS — Z9189 Other specified personal risk factors, not elsewhere classified: Secondary | ICD-10-CM

## 2021-05-19 LAB — CBC
HCT: 42.1 % (ref 39.0–52.0)
Hemoglobin: 14.1 g/dL (ref 13.0–17.0)
MCHC: 33.5 g/dL (ref 30.0–36.0)
MCV: 90.9 fl (ref 78.0–100.0)
Platelets: 117 10*3/uL — ABNORMAL LOW (ref 150.0–400.0)
RBC: 4.64 Mil/uL (ref 4.22–5.81)
RDW: 15.1 % (ref 11.5–15.5)
WBC: 4.2 10*3/uL (ref 4.0–10.5)

## 2021-05-19 LAB — BASIC METABOLIC PANEL WITH GFR
BUN: 18 mg/dL (ref 6–23)
CO2: 29 meq/L (ref 19–32)
Calcium: 9.3 mg/dL (ref 8.4–10.5)
Chloride: 101 meq/L (ref 96–112)
Creatinine, Ser: 1.55 mg/dL — ABNORMAL HIGH (ref 0.40–1.50)
GFR: 42.29 mL/min — ABNORMAL LOW
Glucose, Bld: 269 mg/dL — ABNORMAL HIGH (ref 70–99)
Potassium: 3.9 meq/L (ref 3.5–5.1)
Sodium: 140 meq/L (ref 135–145)

## 2021-05-19 LAB — URINALYSIS, ROUTINE W REFLEX MICROSCOPIC
Bilirubin Urine: NEGATIVE
Hgb urine dipstick: NEGATIVE
Ketones, ur: NEGATIVE
Leukocytes,Ua: NEGATIVE
Nitrite: NEGATIVE
RBC / HPF: NONE SEEN
Specific Gravity, Urine: 1.025 (ref 1.000–1.030)
Total Protein, Urine: NEGATIVE
Urine Glucose: NEGATIVE
Urobilinogen, UA: 1 (ref 0.0–1.0)
pH: 6 (ref 5.0–8.0)

## 2021-05-19 LAB — VITAMIN D 25 HYDROXY (VIT D DEFICIENCY, FRACTURES): VITD: 35.36 ng/mL (ref 30.00–100.00)

## 2021-05-19 NOTE — Progress Notes (Addendum)
Established Patient Office Visit  Subjective:  Patient ID: [name], male    DOB: 1941/07/02  Age: 80 y.o. MRN: [id number]  CC:  Chief Complaint  Patient presents with   [facility] follow up surgery on right hip for closed fractured hip. No concerns .     HPI [name] presents for hospital discharge follow-up.  [name] had gotten up out of bed and went to pick up a clock noted following off the nightstand and fell backwards landing on his buttock.  Sustained an incomplete right femur fracture.  Status post ORIF.  [name] is doing well and ambulating well.  [name] had gone to a SNF for rehabilitation.  Feels as though his gait is steady.  Denies palpitations.  [name] is having problems staying hydrated.  Denies labyrinthitis.  Denies ongoing lightheadedness.  Blood pressure remains in the 120s over 60s.  Takes Lasix 3 times weekly.  Diabetes is well controlled and [name] is not aware of ongoing hypoglycemia.  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   Gynecomastia    Hepatic steatosis    HYPERLIPIDEMIA 05/20/2007   Hypertension    Hypogonadism male    Morbid obesity ([place]) 09/10/2009   OBSTRUCTIVE SLEEP APNEA 11/04/2007   Personal history of colonic polyps    RENAL INSUFFICIENCY 05/12/2008   Sleep apnea    uses cpap   Squamous cell carcinoma of skin 02/16/2020   left lower leg, anterior-cx3,29fu   Urolithiasis     Past Surgical History:  Procedure Laterality Date   Abdominal US  09/1997   arm fracture Left 1958   with hardware   Colon cancer screening     COLONOSCOPY  08/18/2004   diverticulitis    COLONOSCOPY  09/24/2009   ELECTROCARDIOGRAM  02/2006   FLEXIBLE SIGMOIDOSCOPY  03/04/2001   polyps, anal fissure   GANGLION CYST EXCISION Left 1980   HIP PINNING,CANNULATED Right 04/25/2021   Procedure: CANNULATED HIP PINNING;  Surgeon: Rod Can, MD;  Location: [facility];  Service: Orthopedics;  Laterality: Right;   Lower Arterial  04/13/2004   POLYPECTOMY     Rest Cardiolite  03/19/2003    Family History  Problem Relation Age of Onset   Colon cancer Brother 79   Colon polyps Brother    Lung cancer Brother    Other Mother        MVA, deceased 19s   Healthy Daughter    Healthy Son    Rectal cancer Neg Hx    Stomach cancer Neg Hx     Social History   Socioeconomic History   Marital status: Married    Spouse name: Not on file   Number of children: Not on file   Years of education: Not on file   Highest education level: Not on file  Occupational History    Employer: [employer]  Tobacco Use   Smoking status: Former    Packs/day: 2.00    Years: 31.00    Pack years: 62.00    Types: Cigarettes  Start date: 28    Quit date: 10/23/1982    Years since quitting: 38.7   Smokeless tobacco: Never  Vaping Use   Vaping Use: Never used  Substance and Sexual Activity   Alcohol use: No    Alcohol/week: 0.0 standard drinks   Drug use: No   Sexual activity: Not on file  Other Topics Concern   Not on file  Social History Narrative   Lives with wife in a one story home.  Has a son and a daughter.     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     Education: 2 years of college.   Social Determinants of Health   Financial Resource Strain: Low Risk    Difficulty of Paying Living Expenses: Not hard at all  Food Insecurity: No Food Insecurity   Worried About Charity fundraiser in the Last Year: Never true   [place] in the Last Year: Never true  Transportation Needs: No Transportation Needs   Lack of Transportation (Medical): No   Lack of  Transportation (Non-Medical): No  Physical Activity: Inactive   Days of Exercise per Week: 0 days   Minutes of Exercise per Session: 0 min  Stress: No Stress Concern Present   Feeling of Stress : Not at all  Social Connections: Moderately Integrated   Frequency of Communication with Friends and Family: More than three times a week   Frequency of Social Gatherings with Friends and Family: More than three times a week   Attends Religious Services: Never   Marine scientist or Organizations: Yes   Attends Music therapist: More than 4 times per year   Marital Status: Married  Human resources officer Violence: Not At Risk   Fear of Current or Ex-Partner: No   Emotionally Abused: No   Physically Abused: No   Sexually Abused: No    Outpatient Medications Prior to Visit  Medication Sig Dispense Refill   Accu-Chek FastClix Lancets MISC USE TO CHECK BLOOD SUGAR UP TO 6 TIMES DAILY 612 each 3   alfuzosin (UROXATRAL) 10 MG 24 hr tablet TAKE 1 TABLET BY MOUTH  DAILY WITH BREAKFAST 90 tablet 3   amitriptyline (ELAVIL) 50 MG tablet Take 25 mg by mouth at bedtime.     aspirin EC 81 MG EC tablet Take 1 tablet (81 mg total) by mouth 2 (two) times daily with a meal. Swallow whole. 90 tablet 0   Blood Glucose Monitoring Suppl (ACCU-CHEK AVIVA PLUS) w/Device KIT Use as directed 1 kit 0   cetirizine (ZYRTEC) 10 MG tablet Take 10 mg by mouth daily as needed for allergies.     Continuous Blood Gluc Sensor (DEXCOM G6 SENSOR) MISC Inject 1 Device into the skin as directed. Apply to skin SQ every 10 days 9 each 3   diclofenac Sodium (VOLTAREN) 1 % GEL Apply 2 g topically 4 (four) times daily. 150 g 3   DULoxetine (CYMBALTA) 60 MG capsule TAKE 1 CAPSULE BY MOUTH  DAILY 90 capsule 3   fluticasone (FLONASE) 50 MCG/ACT nasal spray Place 1 spray into the nose daily as needed for allergies.      folic acid (FOLVITE) 1 MG tablet Take 1 mg by mouth daily.     glucose blood (ACCU-CHEK AVIVA PLUS) test strip  USE TO TEST BLOOD SUGAR 6  TIMES PER DAY; E11.9 600 each 11   HUMALOG 100 UNIT/ML injection INJECT SUBCUTANEOUSLY VIA  INSULIN PUMP TOTAL OF 120  UNITS PER DAY 110 mL 3   Insulin Infusion Pump (T: SLIM X2 INS PMP/CONTROL 7.4) DEVI by Does not apply route.     Insulin Infusion Pump Supplies (AUTOSOFT XC INFUSION SET) MISC Inject 1 Act into the skin every other day. Use with 59mm cannula and 23 inch tubing. *5 boxes (90 day supply) 5 each 3   Insulin Infusion Pump Supplies (T:SLIM INSULIN CARTRIDGE 3ML) MISC Use with insulin pump, fill once every 2 days. 5 each 3   losartan (COZAAR) 25 MG tablet Take 12.5 mg by mouth daily.     Multiple Vitamins-Minerals (CENTRUM SILVER PO) Take 1 tablet by mouth daily.     Omega-3 Fatty Acids (FISH OIL) 1200 MG CAPS Take 1,200 mg by mouth daily.     omeprazole (PRILOSEC) 20 MG capsule Take 1 capsule (20 mg total) by mouth daily as needed. 90 capsule 2   rosuvastatin (CRESTOR) 40 MG tablet TAKE 1 TABLET BY MOUTH AT  BEDTIME 90 tablet 3   vitamin B-12 (CYANOCOBALAMIN) 1000 MCG tablet Take 1,000 mcg by mouth daily.     allopurinol (ZYLOPRIM) 300 MG tablet TAKE 1 TABLET BY MOUTH  DAILY 90 tablet 3   Dulaglutide (TRULICITY) 4.5 GG/2.6RS SOPN Inject 4.5 mg as directed once a week. 6 mL 3   furosemide (LASIX) 20 MG tablet Take 20 mg by mouth See admin instructions. Take 1 tablet (20 mg) on MWF     HYDROcodone-acetaminophen (NORCO/VICODIN) 5-325 MG tablet Take 1-2 tablets by mouth every 6 (six) hours as needed for moderate pain. 40 tablet 0   No facility-administered medications prior to visit.    Allergies  Allergen Reactions   Lipitor [Atorvastatin] Other (See Comments)    unknown   Niacin     REACTION: Severe heartburn   Pioglitazone     REACTION: Edema    ROS Review of Systems  Constitutional: Negative.   HENT: Negative.    Eyes:  Negative for photophobia and visual disturbance.  Respiratory: Negative.    Cardiovascular:  Negative for chest pain and  palpitations.  Gastrointestinal: Negative.   Genitourinary:  Negative for decreased urine volume, dysuria, frequency and urgency.  Musculoskeletal:  Positive for back pain. Negative for gait problem.  Neurological:  Negative for dizziness, weakness and light-headedness.  Psychiatric/Behavioral: Negative.       Objective:    Physical Exam Vitals and nursing note reviewed.  Constitutional:      General: [name] is not in acute distress.    Appearance: Normal appearance. [name] is not ill-appearing, toxic-appearing or diaphoretic.  HENT:     Head: Normocephalic and atraumatic.     Right Ear: External ear normal.     Left Ear: External ear normal.     Mouth/Throat:     Mouth: Mucous membranes are moist.     Pharynx: Oropharynx is clear. No oropharyngeal exudate or posterior oropharyngeal erythema.  Eyes:     General: No scleral icterus.       Right eye: No discharge.        Left eye: No discharge.     Extraocular Movements: Extraocular movements intact.     Conjunctiva/sclera: Conjunctivae normal.     Pupils: Pupils are equal, round, and reactive to light.  Cardiovascular:     Rate and Rhythm: Normal rate and regular rhythm.  Pulmonary:     Effort: Pulmonary effort is normal.     Breath sounds: Normal breath sounds.  Abdominal:     General: Bowel sounds are normal.  Musculoskeletal:     Cervical back: No rigidity or tenderness.  Lymphadenopathy:     Cervical: No cervical adenopathy.  Skin:    General: Skin is warm and dry.  Neurological:     Mental Status: [name] is alert and oriented to person, place, and time.  Psychiatric:        Mood and Affect: Mood normal.        Behavior: Behavior normal.    BP 124/66 (BP Location: Right Arm, Patient Position: Sitting, Cuff Size: Large)   Pulse 94   Temp (!) 97.5 F (36.4 C) (Temporal)   Ht 6' (1.829 m)   Wt 261 lb (118.4 kg)   SpO2 96%   BMI 35.40 kg/m  Wt Readings from Last 3 Encounters:  06/14/21 263 lb (119.3 kg)  05/27/21 263 lb  (119.3 kg)  05/20/21 263 lb 12.8 oz (119.7 kg)     Health Maintenance Due  Topic Date Due   Hepatitis C Screening  Never done   Zoster Vaccines- Shingrix (1 of 2) Never done   TETANUS/TDAP  10/27/2018   COLONOSCOPY (Pts 45-61yrs Insurance coverage will need to be confirmed)  05/04/2020   INFLUENZA VACCINE  05/23/2021    There are no preventive care reminders to display for this patient.  Lab Results  Component Value Date   TSH 2.98 09/21/2020   Lab Results  Component Value Date   WBC 4.2 05/19/2021   HGB 14.1 05/19/2021   HCT 42.1 05/19/2021   MCV 90.9 05/19/2021   PLT 117.0 (L) 05/19/2021   Lab Results  Component Value Date   NA 140 05/19/2021   K 3.9 05/19/2021   CO2 29 05/19/2021   GLUCOSE 269 (H) 05/19/2021   BUN 18 05/19/2021   CREATININE 1.55 (H) 05/19/2021   BILITOT 0.7 02/28/2021   ALKPHOS 82 02/28/2021   AST 18 02/28/2021   ALT 20 02/28/2021   PROT 6.6 02/28/2021   ALBUMIN 4.0 02/28/2021   CALCIUM 9.3 05/19/2021   ANIONGAP 9 04/27/2021   GFR 42.29 (L) 05/19/2021   Lab Results  Component Value Date   CHOL 135 02/28/2021   Lab Results  Component Value Date   HDL 33.60 (L) 02/28/2021   Lab Results  Component Value Date   LDLCALC 66 02/28/2021   Lab Results  Component Value Date   TRIG 174.0 (H) 02/28/2021   Lab Results  Component Value Date   CHOLHDL 4 02/28/2021   Lab Results  Component Value Date   HGBA1C 7.3 (A) 05/20/2021      Assessment & Plan:   Problem List Items Addressed This Visit       Musculoskeletal and Integument   Osteoporotic fracture of hip with routine healing, subsequent encounter   Relevant Orders   VITAMIN D 25 Hydroxy (Vit-D Deficiency, Fractures) (Completed)     Other   Vitamin D deficiency - Primary   Relevant Orders   VITAMIN D 25 Hydroxy (Vit-D Deficiency, Fractures) (Completed)   Dehydration   Relevant Orders   Basic metabolic panel (Completed)   Urinalysis, Routine w reflex microscopic  (Completed)   Leukocytes in urine   Relevant Orders   CBC (Completed)   Urinalysis, Routine w reflex microscopic (Completed)   Urine Culture (Completed)   At risk of fracture due to osteoporosis   Relevant Orders   DG Bone Density (Completed)    No orders of the defined types were placed in this encounter.   Follow-up: Return in about 3 months (around 08/19/2021).  Libby Maw, MD

## 2021-05-20 ENCOUNTER — Other Ambulatory Visit: Payer: Self-pay

## 2021-05-20 ENCOUNTER — Ambulatory Visit: Payer: Medicare Other | Admitting: Endocrinology

## 2021-05-20 VITALS — BP 144/70 | HR 97 | Ht 72.0 in | Wt 263.8 lb

## 2021-05-20 DIAGNOSIS — E0800 Diabetes mellitus due to underlying condition with hyperosmolarity without nonketotic hyperglycemic-hyperosmolar coma (NKHHC): Secondary | ICD-10-CM

## 2021-05-20 DIAGNOSIS — E119 Type 2 diabetes mellitus without complications: Secondary | ICD-10-CM | POA: Diagnosis not present

## 2021-05-20 DIAGNOSIS — E669 Obesity, unspecified: Secondary | ICD-10-CM | POA: Diagnosis not present

## 2021-05-20 LAB — URINE CULTURE
MICRO NUMBER:: 12174767
Result:: NO GROWTH
SPECIMEN QUALITY:: ADEQUATE

## 2021-05-20 LAB — POCT GLYCOSYLATED HEMOGLOBIN (HGB A1C): Hemoglobin A1C: 7.3 % — AB (ref 4.0–5.6)

## 2021-05-20 MED ORDER — TRULICITY 1.5 MG/0.5ML ~~LOC~~ SOAJ: 1.5000 mg | SUBCUTANEOUS | 3 refills | Status: DC

## 2021-05-20 NOTE — Patient Instructions (Addendum)
Please take these pump settings:  basal rate of 1.5 units/hr (when not in "control-IQ" mode).   mealtime bolus of 1 unit/4 grams carbohydrate.  continue correction bolus (which some people call "sensitivity," or "insulin sensitivity ratio," or just "isr") of 1 unit for each 40 by which your glucose exceeds 100.   Please increase the goal blood sugar in control IQ to 120.   I have sent a prescription to your pharmacy, to resume the Trulicity at a lower amount.   Please come back for a follow-up appointment in 2 months.

## 2021-05-20 NOTE — Progress Notes (Signed)
Subjective:    Patient ID: [name], male    DOB: [birth year], 80 y.o.   MRN: [id number]  HPI Pt returns for f/u of diabetes mellitus:  DM type: Insulin-requiring type 2.   Dx'ed: 7209 Complications: PN, CAD, and stage 4 CRI.   Therapy: insulin since 1995.  DKA: never.  Severe hypoglycemia: never.  Pancreatitis: never.  Other: he started pump therapy (now T-slim, since mid-2015), and dexcom G-6 continuous glucose monitor.   Interval history: he takes these pump settings:   basal rate of 1.5 units/hr, (when not in "control IQ").  mealtime bolus of 1 unit/4 grams carbohydrate.  correction bolus (which some people call "sensitivity," or "insulin sensitivity ratio," or just "isr") of 1 unit for each 40 by which glucose exceeds 100.  "Control-IQ" is on 90% of the time TDD is approx 60 units (51% basal, 45% meal bolus, 1% correction bolus).   I reviewed continuous glucose monitor data.  Glucose varies from 45-400, but most are in the 100's.  It is in general highest at [facility] and 9PM.  It decreases overnight to 7AM, when it is lowest Pt was off pump x 3 weeks, after hip fx.  He says glycemic control was poor then.  He seldom has hypoglycemia, and these episodes are mild.   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   Gynecomastia    Hepatic steatosis    HYPERLIPIDEMIA 05/20/2007   Hypertension    Hypogonadism male    Morbid obesity ([place]) 09/10/2009   OBSTRUCTIVE SLEEP APNEA 11/04/2007   Personal history of colonic polyps    RENAL INSUFFICIENCY 05/12/2008   Sleep apnea    uses cpap   Squamous cell  carcinoma of skin 02/16/2020   left lower leg, anterior-cx3,48f   Urolithiasis     Past Surgical History:  Procedure Laterality Date   Abdominal [place] 09/1997   arm fracture Left 1958   with hardware   Colon cancer screening     COLONOSCOPY  08/18/2004   diverticulitis   COLONOSCOPY  09/24/2009   ELECTROCARDIOGRAM  02/2006   FLEXIBLE SIGMOIDOSCOPY  03/04/2001   polyps, anal fissure   GANGLION CYST EXCISION Left 1980   HIP PINNING,CANNULATED Right 04/25/2021   Procedure: CANNULATED HIP PINNING;  Surgeon: SRod Can MD;  Location: [facility];  Service: Orthopedics;  Laterality: Right;   Lower Arterial  04/13/2004   POLYPECTOMY     Rest Cardiolite  03/19/2003    Social History   Socioeconomic History   Marital status: Married    Spouse name: Not on file   Number of children: Not on file   Years of education: Not on file   Highest education level: Not on file  Occupational History    Employer: [employer]  Tobacco Use   Smoking status: Former    Packs/day: 2.00    Years: 31.00    Pack years: 62.00    Types: Cigarettes    Quit date: 10/23/1982    Years since quitting: 38.6   Smokeless tobacco:  Never  Vaping Use   Vaping Use: Never used  Substance and Sexual Activity   Alcohol use: No    Alcohol/week: 0.0 standard drinks   Drug use: No   Sexual activity: Not on file  Other Topics Concern   Not on file  Social History Narrative   Lives with wife in a one story home.  Has a son and a daughter.     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     Education: 2 years of college.   Social Determinants of Health   Financial Resource Strain: Low Risk    Difficulty of Paying Living Expenses: Not hard at all  Food Insecurity: No Food Insecurity   Worried About Charity fundraiser in the Last Year: Never true   [place] in the Last Year: Never true  Transportation Needs: No Transportation Needs   Lack of Transportation (Medical): No   Lack of  Transportation (Non-Medical): No  Physical Activity: Inactive   Days of Exercise per Week: 0 days   Minutes of Exercise per Session: 0 min  Stress: No Stress Concern Present   Feeling of Stress : Not at all  Social Connections: Moderately Integrated   Frequency of Communication with Friends and Family: More than three times a week   Frequency of Social Gatherings with Friends and Family: More than three times a week   Attends Religious Services: Never   Marine scientist or Organizations: Yes   Attends Music therapist: More than 4 times per year   Marital Status: Married  Human resources officer Violence: Not At Risk   Fear of Current or Ex-Partner: No   Emotionally Abused: No   Physically Abused: No   Sexually Abused: No    Current Outpatient Medications on File Prior to Visit  Medication Sig Dispense Refill   Accu-Chek FastClix Lancets MISC USE TO CHECK BLOOD SUGAR UP TO 6 TIMES DAILY 612 each 3   alfuzosin (UROXATRAL) 10 MG 24 hr tablet TAKE 1 TABLET BY MOUTH  DAILY WITH BREAKFAST 90 tablet 3   allopurinol (ZYLOPRIM) 300 MG tablet TAKE 1 TABLET BY MOUTH  DAILY 90 tablet 3   amitriptyline (ELAVIL) 50 MG tablet Take 25 mg by mouth at bedtime.     aspirin EC 81 MG EC tablet Take 1 tablet (81 mg total) by mouth 2 (two) times daily with a meal. Swallow whole. 90 tablet 0   Blood Glucose Monitoring Suppl (ACCU-CHEK AVIVA PLUS) w/Device KIT Use as directed 1 kit 0   cetirizine (ZYRTEC) 10 MG tablet Take 10 mg by mouth daily as needed for allergies.     Continuous Blood Gluc Sensor (DEXCOM G6 SENSOR) MISC Inject 1 Device into the skin as directed. Apply to skin SQ every 10 days 9 each 3   diclofenac Sodium (VOLTAREN) 1 % GEL Apply 2 g topically 4 (four) times daily. 150 g 3   DULoxetine (CYMBALTA) 60 MG capsule TAKE 1 CAPSULE BY MOUTH  DAILY 90 capsule 3   fluticasone (FLONASE) 50 MCG/ACT nasal spray Place 1 spray into the nose daily as needed for allergies.      folic acid  (FOLVITE) 1 MG tablet Take 1 mg by mouth daily.     furosemide (LASIX) 20 MG tablet Take 20 mg by mouth See admin instructions. Take 1 tablet (20 mg) on MWF     glucose blood (ACCU-CHEK AVIVA PLUS) test strip USE TO TEST BLOOD SUGAR 6  TIMES PER DAY; E11.9 600 each 11   HUMALOG 100 UNIT/ML injection INJECT SUBCUTANEOUSLY VIA  INSULIN PUMP TOTAL OF 120  UNITS PER DAY 110 mL 3   Insulin Infusion Pump (T: SLIM X2 INS PMP/CONTROL 7.4) DEVI by Does not apply route.     Insulin Infusion Pump Supplies (AUTOSOFT XC INFUSION SET) MISC Inject 1 Act into the skin every other day. Use with 78m cannula and 23 inch tubing. *5 boxes (90 day supply) 5 each 3   Insulin Infusion Pump Supplies (T:SLIM INSULIN CARTRIDGE 3ML) MISC Use with insulin pump, fill once every 2 days. 5 each 3   losartan (COZAAR) 25 MG tablet Take 12.5 mg by mouth daily.     Multiple Vitamins-Minerals (CENTRUM SILVER PO) Take 1 tablet by mouth daily.     Omega-3 Fatty Acids (FISH OIL) 1200 MG CAPS Take 1,200 mg by mouth daily.     omeprazole (PRILOSEC) 20 MG capsule Take 1 capsule (20 mg total) by mouth daily as needed. 90 capsule 2   rosuvastatin (CRESTOR) 40 MG tablet TAKE 1 TABLET BY MOUTH AT  BEDTIME 90 tablet 3   vitamin B-12 (CYANOCOBALAMIN) 1000 MCG tablet Take 1,000 mcg by mouth daily.     No current facility-administered medications on file prior to visit.    Allergies  Allergen Reactions   Atorvastatin Other (See Comments)    unknown   Niacin     REACTION: Severe heartburn   Pioglitazone     REACTION: Edema    Family History  Problem Relation Age of Onset   Colon cancer Brother 532  Colon polyps Brother    Lung cancer Brother    Other Mother        MVA, deceased 568s  Healthy Daughter    Healthy Son    Rectal cancer Neg Hx    Stomach cancer Neg Hx     BP (!) 144/70 (BP Location: Right Arm, Patient Position: Sitting, Cuff Size: Large)   Pulse 97   Ht 6' (1.829 m)   Wt 263 lb 12.8 oz (119.7 kg)   SpO2 94%    BMI 35.78 kg/m    Review of Systems He has lost 7 lbs since last ov here.      Objective:   Physical Exam Pulses: dorsalis pedis intact bilat.   MSK: no deformity of the feet CV: 1+ bilat leg edema Skin:  no ulcer on the feet.  normal color and temp on the feet.   Neuro: sensation is intact to touch on the feet, but decreased from normal.     Lab Results  Component Value Date   HGBA1C 7.3 (A) 05/20/2021      Assessment & Plan:  Type 2 DM Obesity: uncontrolled. Goal is to resume GLP rx.   Patient Instructions  Please take these pump settings:  basal rate of 1.5 units/hr (when not in "control-IQ" mode).   mealtime bolus of 1 unit/4 grams carbohydrate.  continue correction bolus (which some people call "sensitivity," or "insulin sensitivity ratio," or just "isr") of 1 unit for each 40 by which your glucose exceeds 100.   Please increase the goal blood sugar in control IQ to 120.   I have sent a prescription to your pharmacy, to resume the Trulicity at a lower amount.   Please come back for a follow-up appointment in 2 months.

## 2021-05-21 ENCOUNTER — Other Ambulatory Visit: Payer: Self-pay | Admitting: Family Medicine

## 2021-05-27 ENCOUNTER — Encounter: Payer: Self-pay | Admitting: Gastroenterology

## 2021-05-27 ENCOUNTER — Other Ambulatory Visit: Payer: Self-pay

## 2021-05-27 ENCOUNTER — Telehealth: Payer: Self-pay

## 2021-05-27 ENCOUNTER — Ambulatory Visit (INDEPENDENT_AMBULATORY_CARE_PROVIDER_SITE_OTHER): Payer: Medicare Other | Admitting: Gastroenterology

## 2021-05-27 VITALS — BP 138/60 | HR 96 | Ht 71.0 in | Wt 263.0 lb

## 2021-05-27 DIAGNOSIS — Z86010 Personal history of colonic polyps: Secondary | ICD-10-CM

## 2021-05-27 NOTE — Progress Notes (Signed)
[facility] Progress Note  Chief Complaint: History of colon polyps  Subjective  History: [name] was seen for an office visit in October 2021 for history of colon polyps, family history of colon polyps in his brother, and evaluation of his multiple medical issues.  He was scheduled for surveillance colonoscopy, however contacted [place] the following month reporting dizziness and falls.  His colonoscopy was canceled and ASAP primary care evaluation recommended. He then had a cardiology evaluation (consult note reviewed), and a reportedly normal echocardiogram and heart monitor referenced in follow-up cardiology note. [name] recently suffered a hip fracture and required surgical repair.  Fortunately, this was done with a minimally invasive surgery and a small incision.  [name] says he has recovered well from the hip fracture, his mobility is improving, but he still uses a cane to be safe.  Denies rectal bleeding or change in bowel habits since I last saw him. He has had no further episodes of syncope, and recalls being told it may have been dehydration. Last colonoscopy July 2016 - two diminutive TAs Brother had cancerous polyp requiring surgery  ROS: Cardiovascular:  no chest pain Respiratory: no dyspnea Right hip pain Remainder of systems negative except as above The patient's Past Medical, Family and Social History were reviewed and are on file in the EMR. 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[place])    Duodenitis    Edema 05/12/2008   Esophageal stricture    GERD (gastroesophageal reflux disease)    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   Gynecomastia    Hepatic steatosis    HYPERLIPIDEMIA 05/20/2007   Hypertension    Hypogonadism male     Morbid obesity ([place]) 09/10/2009   OBSTRUCTIVE SLEEP APNEA 11/04/2007   Personal history of colonic polyps    RENAL INSUFFICIENCY 05/12/2008   Sleep apnea    uses cpap   Squamous cell carcinoma of skin 02/16/2020   left lower leg, anterior-cx3,74f   Urolithiasis    Past Surgical History:  Procedure Laterality Date   Abdominal [place] 09/1997   arm fracture Left 1958   with hardware   Colon cancer screening     COLONOSCOPY  08/18/2004   diverticulitis   COLONOSCOPY  09/24/2009   ELECTROCARDIOGRAM  02/2006   FLEXIBLE SIGMOIDOSCOPY  03/04/2001   polyps, anal fissure   GANGLION CYST EXCISION Left 1980   HIP PINNING,CANNULATED Right 04/25/2021   Procedure: CANNULATED HIP PINNING;  Surgeon: SRod Can MD;  Location: [facility];  Service: Orthopedics;  Laterality: Right;   Lower Arterial  04/13/2004   POLYPECTOMY     Rest Cardiolite  03/19/2003    Objective:  Med list reviewed  Current Outpatient Medications:    Accu-Chek FastClix Lancets MISC, USE TO CHECK BLOOD SUGAR UP TO 6 TIMES DAILY, Disp: 612 each, Rfl: 3   alfuzosin (UROXATRAL) 10 MG 24 hr tablet, TAKE 1 TABLET BY MOUTH  DAILY WITH BREAKFAST, Disp: 90 tablet, Rfl: 3   allopurinol (ZYLOPRIM) 300 MG tablet, TAKE 1 TABLET BY MOUTH  DAILY, Disp: 90 tablet, Rfl: 3   amitriptyline (ELAVIL) 50 MG tablet, Take 25 mg by mouth at bedtime., Disp: , Rfl:    aspirin EC  81 MG EC tablet, Take 1 tablet (81 mg total) by mouth 2 (two) times daily with a meal. Swallow whole., Disp: 90 tablet, Rfl: 0   Blood Glucose Monitoring Suppl (ACCU-CHEK AVIVA PLUS) w/Device KIT, Use as directed, Disp: 1 kit, Rfl: 0   cetirizine (ZYRTEC) 10 MG tablet, Take 10 mg by mouth daily as needed for allergies., Disp: , Rfl:    Continuous Blood Gluc Sensor (DEXCOM G6 SENSOR) MISC, Inject 1 Device into the skin as directed. Apply to skin SQ every 10 days, Disp: 9 each, Rfl: 3   diclofenac Sodium (VOLTAREN) 1 % GEL, Apply 2 g topically 4 (four) times daily., Disp: 150 g, Rfl:  3   Dulaglutide (TRULICITY) 1.5 BD/5.3GD SOPN, Inject 1.5 mg into the skin once a week., Disp: 6 mL, Rfl: 3   DULoxetine (CYMBALTA) 60 MG capsule, TAKE 1 CAPSULE BY MOUTH  DAILY, Disp: 90 capsule, Rfl: 3   fluticasone (FLONASE) 50 MCG/ACT nasal spray, Place 1 spray into the nose daily as needed for allergies. , Disp: , Rfl:    folic acid (FOLVITE) 1 MG tablet, Take 1 mg by mouth daily., Disp: , Rfl:    furosemide (LASIX) 20 MG tablet, TAKE 1 TABLET BY MOUTH  DAILY, Disp: 90 tablet, Rfl: 3   glucose blood (ACCU-CHEK AVIVA PLUS) test strip, USE TO TEST BLOOD SUGAR 6  TIMES PER DAY; E11.9, Disp: 600 each, Rfl: 11   HUMALOG 100 UNIT/ML injection, INJECT SUBCUTANEOUSLY VIA  INSULIN PUMP TOTAL OF 120  UNITS PER DAY, Disp: 110 mL, Rfl: 3   Insulin Infusion Pump (T: SLIM X2 INS PMP/CONTROL 7.4) DEVI, by Does not apply route., Disp: , Rfl:    Insulin Infusion Pump Supplies (AUTOSOFT XC INFUSION SET) MISC, Inject 1 Act into the skin every other day. Use with 57m cannula and 23 inch tubing. *5 boxes (90 day supply), Disp: 5 each, Rfl: 3   Insulin Infusion Pump Supplies (T:SLIM INSULIN CARTRIDGE 3ML) MISC, Use with insulin pump, fill once every 2 days., Disp: 5 each, Rfl: 3   losartan (COZAAR) 25 MG tablet, Take 12.5 mg by mouth daily., Disp: , Rfl:    Multiple Vitamins-Minerals (CENTRUM SILVER PO), Take 1 tablet by mouth daily., Disp: , Rfl:    Omega-3 Fatty Acids (FISH OIL) 1200 MG CAPS, Take 1,200 mg by mouth daily., Disp: , Rfl:    omeprazole (PRILOSEC) 20 MG capsule, Take 1 capsule (20 mg total) by mouth daily as needed., Disp: 90 capsule, Rfl: 2   rosuvastatin (CRESTOR) 40 MG tablet, TAKE 1 TABLET BY MOUTH AT  BEDTIME, Disp: 90 tablet, Rfl: 3   vitamin B-12 (CYANOCOBALAMIN) 1000 MCG tablet, Take 1,000 mcg by mouth daily., Disp: , Rfl:    Vital signs in last 24 hrs: Vitals:   05/27/21 1356  BP: 138/60  Pulse: 96   Wt Readings from Last 3 Encounters:  05/27/21 263 lb (119.3 kg)  05/20/21 263 lb  12.8 oz (119.7 kg)  05/19/21 261 lb (118.4 kg)    Physical Exam  Antalgic gait, gets on exam table slowly but independently HEENT: sclera anicteric, oral mucosa moist without lesions Neck: supple, no thyromegaly, JVD or lymphadenopathy Cardiac: RRR without murmurs, S1S2 heard, no peripheral edema Pulm: clear to auscultation bilaterally, normal RR and effort noted Abdomen: soft, no tenderness, with active bowel sounds. No guarding or palpable hepatosplenomegaly.  Limited by body habitus Skin; warm and dry, no jaundice or rash Less than 2 inch long incision right hip Labs:  CBC Latest Ref Rng & Units 05/19/2021 04/28/2021 04/27/2021  WBC 4.0 - 10.5 K/uL 4.2 4.3 6.3  Hemoglobin 13.0 - 17.0 g/dL 14.1 11.7(L) 12.7(L)  Hematocrit 39.0 - 52.0 % 42.1 34.8(L) 38.6(L)  Platelets 150.0 - 400.0 K/uL 117.0(L) 128(L) 107(L)    ___________________________________________ Radiologic studies:   ____________________________________________ Other:   _____________________________________________ Assessment & Plan  Assessment: Encounter Diagnosis  Name Primary?   Personal history of colonic polyps Yes   Family history of colon cancer as well.   Plan: Surveillance colonoscopy.  He was agreeable after discussion of procedure and risks.  The benefits and risks of the planned procedure were described in detail with the patient or (when appropriate) their health care proxy.  Risks were outlined as including, but not limited to, bleeding, infection, perforation, adverse medication reaction leading to cardiac or pulmonary decompensation, pancreatitis (if ERCP).  The limitation of incomplete mucosal visualization was also discussed.  No guarantees or warranties were given.  Patient at increased risk for cardiopulmonary complications of procedure due to medical comorbidities.  We will plan for late September when he has had some more time to recover after the recent hip surgery    Nelida Meuse  III

## 2021-05-27 NOTE — Telephone Encounter (Signed)
Alric Geise Jul 25, 1941 [id number]  Dear Dr. Loanne Drilling,   Nelida Meuse, MD has scheduled the above patient for a colonoscopy on 06-23-2021.  Our records show that he/she is on insulin therapy via an insulin pump.  Our colonoscopy prep protocol requires that:  the patient must be on a clear liquid diet the entire day prior to the procedure date as well as the morning of the procedure  the patient must be NPO for 3 hours prior to the procedure   the patient must consume a PEG 3350 solution to prepare for the procedure.  Please advise [place] of any adjustments that need to be made to the patient's insulin pump therapy prior to the above procedure date.    Please route or fax back this completed form to me at [phone] .  If you have any question, please call me at [phone].  Thank you for your help with this matter.  Sincerely,   Eunie Lawn,CMA

## 2021-05-27 NOTE — Patient Instructions (Signed)
If you are age 80 or older, your body mass index should be between 23-30. Your Body mass index is 36.68 kg/m. If this is out of the aforementioned range listed, please consider follow up with your Primary Care Provider.  If you are age 63 or younger, your body mass index should be between 19-25. Your Body mass index is 36.68 kg/m. If this is out of the aformentioned range listed, please consider follow up with your Primary Care Provider.   __________________________________________________________  The [facility] providers would like to encourage you to use [facility] to communicate with providers for non-urgent requests or questions.  Due to long hold times on the telephone, sending your provider a message by [facility] may be a faster and more efficient way to get a response.  Please allow 48 business hours for a response.  Please remember that this is for non-urgent requests.   You have been scheduled for a colonoscopy. Please follow written instructions given to you at your visit today.  Please pick up your prep supplies at the pharmacy within the next 1-3 days. If you use inhalers (even only as needed), please bring them with you on the day of your procedure.  It was a pleasure to see you today!  Thank you for trusting me with your gastrointestinal care!

## 2021-05-30 NOTE — Telephone Encounter (Signed)
Instructions from Dr Loanne Drilling has been forwarded to [name]

## 2021-06-13 NOTE — Progress Notes (Signed)
Subjective:    Patient ID: [name], male    DOB: 14-Jul-1941, 80 y.o.   MRN: [id number]  HPI   male former smoker followed for OSA, complicated by obesity, HBP, DM 2, diastolic dysfunction, GERD NPSG 2004 AHI 9/hr  --------------------------------------------------------------------------------------------   05/07/20- 80 year old male former smoker followed for OSA, lung nodules,  complicated by obesity, HBP, DM 1, diastolic dysfunction, GERD CPAP 11.6/[facility] Download- compliance 97%, AHI 6.3/ hr  Mostly centrals Body weight today- 282 lbs -----using cpap, pulling on mask at night,causing leaks, DME [facility] Still doing pretty well- better off with CPAP. Did mask fitting.  Settled on full-face. Reviewed download. CXR 05/08/2019- IMPRESSION: 1. Stable scarring in the lingula. No acute cardiopulmonary disease. 2. The small BILATERAL lung nodule identified on the prior CT are too small to visualized on the chest x-ray. However, based on their appearance on the prior CT, they are likely benign. Given the patient's smoking history, a final follow-up chest CT may be helpful to confirm benignity, based on Fleischner society recommendations.  06/14/21-  80 year old male former smoker followed for OSA, lung nodules,  complicated by obesity, HBP, DM 2, diastolic dysfunction, GERD, HLD, CKD,  CPAP 11.6/[facility] Download-compliance 100%, AHI 6.1/ hr Body weight today- 263 lbs Covid vax- R Hip Fx SGY 7/4 Very comfortable with CPAP, sleeping well. Download reviewed. Machine over 31 years old. Discussed replacement. Breathing fine with little cough. Tolerated surgery/ anesthesia for hip pinning in July. Discussed CXR report with question of LUL density.   CXR 04/23/21 1V-  IMPRESSION: Increased density overlying the UPPER LEFT lung which may be related to the end of the LEFT 1st rib. Recommend PA and lateral chest x-ray for further evaluation. No other significant  abnormality.   Review of Systems + = Positive Constitutional: Negative for fever and unexpected weight change.  HENT: Negative for congestion, dental problem, ear pain, nosebleeds, postnasal drip, rhinorrhea, sinus pressure, sneezing, sore throat and trouble swallowing.   Eyes: Negative for redness and itching.  Respiratory: Negative for cough, chest tightness, shortness of breath and wheezing.   Cardiovascular: Negative for palpitations and leg swelling.  Gastrointestinal: Negative for nausea and vomiting.  Genitourinary: Negative for dysuria.  Musculoskeletal: Negative for joint swelling.  Skin: Negative for rash.  Neurological: Negative for headaches. + falls Hematological: Does not bruise/bleed easily.  Psychiatric/Behavioral: Negative for dysphoric mood. The patient is not nervous/anxious.    Objective:  OBJ- Physical Exam General- Alert, Oriented, Affect-appropriate, Distress- none acute, + obese Skin- rash-none, lesions- none, excoriation- none Lymphadenopathy- none Head- atraumatic            Eyes- Gross vision intact, PERRLA, conjunctivae and secretions clear            Ears- Hearing, canals-normal            Nose- Clear, no-Septal dev, mucus, polyps, erosion, perforation             Throat- Mallampati III , mucosa clear , drainage- none, tonsils- atrophic Neck- flexible , trachea midline, no stridor , thyroid nl, carotid no bruit Chest - symmetrical excursion , unlabored           Heart/CV- RRR , no murmur , no gallop  , no rub, nl s1 s2                           - JVD- none , edema- none, stasis changes- none, varices- none  Lung- clear to P&A, wheeze- none, cough- none , dullness-none, rub- none           Chest wall-  Abd-  Br/ Gen/ Rectal- Not done, not indicated Extrem- cyanosis- none, clubbing, none, atrophy- none, strength- nl, +cane Neuro- grossly intact to observation    Assessment & Plan:

## 2021-06-14 ENCOUNTER — Other Ambulatory Visit: Payer: Self-pay

## 2021-06-14 ENCOUNTER — Ambulatory Visit: Payer: Medicare Other | Admitting: Internal Medicine

## 2021-06-14 ENCOUNTER — Encounter: Payer: Self-pay | Admitting: Internal Medicine

## 2021-06-14 ENCOUNTER — Ambulatory Visit (INDEPENDENT_AMBULATORY_CARE_PROVIDER_SITE_OTHER): Payer: Medicare Other

## 2021-06-14 VITALS — BP 130/62 | HR 86 | Temp 97.8°F | Ht 71.0 in | Wt 263.0 lb

## 2021-06-14 DIAGNOSIS — R918 Other nonspecific abnormal finding of lung field: Secondary | ICD-10-CM

## 2021-06-14 DIAGNOSIS — G4733 Obstructive sleep apnea (adult) (pediatric): Secondary | ICD-10-CM

## 2021-06-14 NOTE — Assessment & Plan Note (Signed)
Benefits from CPAP with good compliance and fair control Plan- replace old machine, changing to auto 5-15

## 2021-06-14 NOTE — Patient Instructions (Signed)
Order- DME [facility]    please replace old CPAP machine, change to auto 5-15, mask of choice, humidifier, supplies, AirView/ card  Order- CXR   dx Lung nodule  Please call if we can help

## 2021-06-20 ENCOUNTER — Other Ambulatory Visit: Payer: Self-pay

## 2021-06-20 ENCOUNTER — Ambulatory Visit (HOSPITAL_BASED_OUTPATIENT_CLINIC_OR_DEPARTMENT_OTHER)
Admission: RE | Admit: 2021-06-20 | Discharge: 2021-06-20 | Disposition: A | Discharge status: Home / Self Care | Payer: Medicare Other | Source: Ambulatory Visit | Attending: Family Medicine | Admitting: Family Medicine

## 2021-06-20 DIAGNOSIS — E119 Type 2 diabetes mellitus without complications: Secondary | ICD-10-CM | POA: Diagnosis not present

## 2021-06-20 DIAGNOSIS — Z87891 Personal history of nicotine dependence: Secondary | ICD-10-CM | POA: Diagnosis not present

## 2021-06-20 DIAGNOSIS — S72002D Fracture of unspecified part of neck of left femur, subsequent encounter for closed fracture with routine healing: Secondary | ICD-10-CM | POA: Insufficient documentation

## 2021-06-20 DIAGNOSIS — Z09 Encounter for follow-up examination after completed treatment for conditions other than malignant neoplasm: Secondary | ICD-10-CM | POA: Diagnosis present

## 2021-06-20 DIAGNOSIS — M80059D Age-related osteoporosis with current pathological fracture, unspecified femur, subsequent encounter for fracture with routine healing: Secondary | ICD-10-CM

## 2021-06-23 ENCOUNTER — Encounter: Payer: Medicare Other | Admitting: Gastroenterology

## 2021-06-23 ENCOUNTER — Telehealth: Payer: Self-pay | Admitting: Gastroenterology

## 2021-06-23 DIAGNOSIS — N1831 Chronic kidney disease, stage 3a: Secondary | ICD-10-CM

## 2021-06-23 DIAGNOSIS — Z86010 Personal history of colonic polyps: Secondary | ICD-10-CM

## 2021-06-23 MED ORDER — GOLYTELY 236 G PO SOLR: 4000.0000 mL | Freq: Once | ORAL | 0 refills | Status: AC

## 2021-06-23 NOTE — Telephone Encounter (Signed)
This patient was scheduled for a colonoscopy with me at 8:00 this morning.  He called the overnight physician early this morning because he had had no results from the evening prep dose, and had delayed effect from the morning prep dose, so he was not ready for the procedure today.  He needs to be rescheduled for colonoscopy with me in the [facility].  Should be one of the first few a.m. slots because he is an insulin requiring diabetic.  For next procedure, prep with Dulcolax and GoLytely as follows:  Dulcolax 5 mg tablets, take 4 tablets at 2 PM  GoLytely 4 L solution a.m./p.m. split dose, starting a 4 PM, one cup every 20 minutes for 2 Liters  A.M. dose (typically 5 hours before procedure time), one cup every 20 minutes for 2 Liters

## 2021-06-23 NOTE — Telephone Encounter (Signed)
Patient has been scheduled for 07-25-2021 @ 1030am. I have spoken to [name], per his request instructions have been sent via mychart.

## 2021-07-08 DIAGNOSIS — M810 Age-related osteoporosis without current pathological fracture: Secondary | ICD-10-CM | POA: Insufficient documentation

## 2021-07-08 DIAGNOSIS — S72001A Fracture of unspecified part of neck of right femur, initial encounter for closed fracture: Secondary | ICD-10-CM | POA: Insufficient documentation

## 2021-07-12 ENCOUNTER — Encounter: Payer: Self-pay | Admitting: Gastroenterology

## 2021-07-13 ENCOUNTER — Other Ambulatory Visit: Payer: Self-pay | Admitting: Physical Medicine & Rehabilitation

## 2021-07-19 ENCOUNTER — Other Ambulatory Visit: Payer: Self-pay

## 2021-07-19 ENCOUNTER — Encounter: Payer: Self-pay | Admitting: Physical Medicine and Rehabilitation

## 2021-07-19 ENCOUNTER — Encounter: Payer: Medicare Other | Attending: Physical Medicine & Rehabilitation | Admitting: Physical Medicine and Rehabilitation

## 2021-07-19 VITALS — BP 144/82 | HR 81 | Temp 98.0°F | Ht 71.0 in | Wt 262.6 lb

## 2021-07-19 DIAGNOSIS — E1142 Type 2 diabetes mellitus with diabetic polyneuropathy: Secondary | ICD-10-CM | POA: Diagnosis present

## 2021-07-19 DIAGNOSIS — M545 Low back pain, unspecified: Secondary | ICD-10-CM | POA: Diagnosis present

## 2021-07-19 DIAGNOSIS — G8929 Other chronic pain: Secondary | ICD-10-CM | POA: Diagnosis present

## 2021-07-19 DIAGNOSIS — M461 Sacroiliitis, not elsewhere classified: Secondary | ICD-10-CM | POA: Diagnosis present

## 2021-07-19 MED ORDER — DULOXETINE HCL 60 MG PO CPEP: 60.0000 mg | ORAL_CAPSULE | Freq: Every day | ORAL | 3 refills | Status: DC

## 2021-07-19 NOTE — Progress Notes (Signed)
Subjective:    Patient ID: [name], male    DOB: 1940/11/20, 80 y.o.   MRN: [id number]   HPI  Male with pmh of OSA, CKD, HTN, DM type 1 with peripheral neuropathy presents for follow up of low back pain > neck pain.   On first visit, pt complained of b/l L>R back pain.  Started ~>20 years ago.  No inciting event.  Sitting/laying improve the pain.  Standing exacerbates the pain.  Sharp, burning pain.  Radiates to left posterior thigh.  Intermittent.  He has associated numbness/tingling.  He only tried ASA, which helps some.  Pain limits from doing ADLs and fishing.  He denies falls.  Last clinic visit on 10/21/2020.  He had trigger point injections at that time.  Since that time, patient states he had great benefit for about a week.  He uses a TENS with benefit. Patient states he feels he is getting weaker. He is taking Baclofen 1-2/week.  Sleep has improved. He is taking Elavil 25.  He states he believe he lost weight, but not exercising. Denies falls. Lightheadedness has improved.    Mr. Arvid presents today for removal of his SPRINT PNS. He found it beneficial and used it daily at a high setting. He feels it would be more beneficial at a higher level and would like to try it again bilaterally. He does not take any other medications for his pain and does not desire to. He requires a cane for ambulation and has a handicap placard.   Blood pressure elevated today.   Pain Inventory Average Pain 5 Pain Right Now 8 My pain is intermittent, sharp, burning, stabbing and tingling  In the last 24 hours, has pain interfered with the following? General activity 4 Relation with others 9 Enjoyment of life 9 What TIME of day is your pain at its worst?  all Sleep (in general) Good  Pain is worse with: walking and standing Pain improves with: rest and TENS Relief from Meds:  n/a'     Family History  Problem Relation Age of Onset   Colon cancer Brother 74   Colon polyps Brother    Lung  cancer Brother    Other Mother        MVA, deceased 38s   Healthy Daughter    Healthy Son    Rectal cancer Neg Hx    Stomach cancer Neg Hx    Social History   Socioeconomic History   Marital status: Married    Spouse name: Not on file   Number of children: Not on file   Years of education: Not on file   Highest education level: Not on file  Occupational History    Employer: [employer]  Tobacco Use   Smoking status: Former    Packs/day: 2.00    Years: 31.00    Pack years: 62.00    Types: Cigarettes    Start date: 1955    Quit date: 10/23/1982    Years since quitting: 38.7   Smokeless tobacco: Never  Vaping Use   Vaping Use: Never used  Substance and Sexual Activity   Alcohol use: No    Alcohol/week: 0.0 standard drinks   Drug use: No   Sexual activity: Not on file  Other Topics Concern   Not on file  Social History Narrative   Lives with wife in a one story home.  Has a son and a daughter.     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     Education: 2 years of college.   Social Determinants of Health   Financial Resource Strain: Low Risk    Difficulty of Paying Living Expenses: Not hard at all  Food Insecurity: No Food Insecurity   Worried About Charity fundraiser in the Last Year: Never true   [place] in the Last Year: Never true  Transportation Needs: No Transportation Needs   Lack of Transportation (Medical): No   Lack of Transportation (Non-Medical): No  Physical Activity: Inactive   Days of Exercise per Week: 0 days   Minutes of Exercise per Session: 0 min  Stress: No Stress Concern Present   Feeling of Stress : Not at all  Social Connections: Moderately Integrated   Frequency of Communication with Friends and Family: More than three times a week   Frequency of Social Gatherings with Friends and Family: More than three times a week   Attends Religious Services: Never   Marine scientist or Organizations: Yes   Attends  Music therapist: More than 4 times per year   Marital Status: Married   Past Surgical History:  Procedure Laterality Date   Abdominal US  09/1997   arm fracture Left 1958   with hardware   Colon cancer screening     COLONOSCOPY  08/18/2004   diverticulitis   COLONOSCOPY  09/24/2009   ELECTROCARDIOGRAM  02/2006   FLEXIBLE SIGMOIDOSCOPY  03/04/2001   polyps, anal fissure   GANGLION CYST EXCISION Left 1980   HIP PINNING,CANNULATED Right 04/25/2021   Procedure: CANNULATED HIP PINNING;  Surgeon: Rod Can, MD;  Location: [facility];  Service: Orthopedics;  Laterality: Right;   Lower Arterial  04/13/2004   POLYPECTOMY     Rest Cardiolite  03/19/2003   Past Medical History:  Diagnosis Date   Allergy    Anal fissure    Anemia, unspecified    Arthritis    Spinal OA   BACK PAIN, LUMBAR 10/23/2007   CARDIAC MURMUR, SYSTOLIC 12/29/7562   DIABETES MELLITUS, TYPE I 3/32/9518   DIASTOLIC DYSFUNCTION 8/41/6606   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[employer])    Duodenitis    Edema 05/12/2008   Esophageal stricture    GERD (gastroesophageal reflux disease)    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   Gynecomastia    Hepatic steatosis    HYPERLIPIDEMIA 05/20/2007   Hypertension    Hypogonadism male    Morbid obesity ([place]) 09/10/2009   OBSTRUCTIVE SLEEP APNEA 11/04/2007   Personal history of colonic polyps    RENAL INSUFFICIENCY 05/12/2008   Sleep apnea    uses cpap   Squamous cell carcinoma of skin 02/16/2020   left lower leg, anterior-cx3,71fu   Urolithiasis    BP (!) 144/82   Pulse 81   Temp 98 F (36.7 C)   Ht 5\' 11"  (1.803 m)   Wt 262 lb 9.6 oz (119.1 kg)   SpO2 94%   BMI 36.63 kg/m   Opioid Risk Score:   Fall Risk Score:  `1  Depression screen PHQ 2/9  Depression screen [facility] 2/9 07/19/2021 05/19/2021 02/21/2021 01/04/2021 10/21/2020 09/14/2020 12/31/2019  Decreased Interest 0 0 0 0 0 0 0  Down, Depressed, Hopeless 0 0 0 0 0 0 0  PHQ - 2 Score 0 0 0 0 0 0 0  Some recent data might  be hidden    Review of Systems  HENT: Negative.    Eyes: Negative.   Respiratory:  Positive  for apnea and shortness of breath.   Cardiovascular: Negative.   Gastrointestinal: Negative.   Endocrine:       Diabetic High blood sugar  Genitourinary: Negative.           Musculoskeletal:  Positive for arthralgias, back pain, gait problem, myalgias and neck pain.  Skin: Negative.   Allergic/Immunologic: Negative.   Neurological:  Positive for weakness and numbness.       Tingling  Hematological: Negative.   All other systems reviewed and are negative.     Objective:   Physical Exam  Constitutional: No distress . Vital signs reviewed. HENT: Normocephalic.  Atraumatic. Eyes: EOMI. No discharge. Cardiovascular: No JVD.   Respiratory: Normal effort.  No stridor.   GI: Non-distended.   Skin: SPRINT PNS is lead is clean/dry/intact- no bleeding upon removal. Psych: Normal mood.  Normal behavior. Musc: TTP lumbar spinous process Musc: Gait: Mildly antalgic Neuro: Alert  HOH             Strength          5/5 in all LE myotomes    Assessment & Plan:  Male with pmh of OSA, CKD, HTN, DM type 1 with peripheral neuropathy presents for follow up of low back pain > neck pain.     1. Chronic mechanical low back pain             MRIs C,L-spine early 2016 suggesting multilevel spondylosis with mild b/l multilevel foraminal narrowing C4-C7 and shallow disc bulge at L5-S1 without central canal or foraminal stenosis.              Avoid NSAIDs due to CKD             Unable to tolerate Gabapentin, Lyrica             Cont HEP  Continue TENS             Aquatic therapy, not going anymore, states ineffective             Cont tylenol  Robaxin 500 TID PRN, changed to Baclofen 10 TID PRN due to change insurance, continue   Continue Cymbalta 60mg              Cont back brace during periods of excessive activity, reminded to avoid prolonged use  Continue Lidoderm OTC, reminded              Encouraged  rest breaks             Acknowledges he can do more if he make a conscious effort             PT completed             Continue Voltaren gel   Pain over spinous process where clothing is resulting increased pressure.  Encourage change in clothing.   Repeat SPRINT PNS at a higher level bilaterally.   2. Sacroiliitis             See #1             Continue  brace             Not main pain generator at present, does not wish to proceed with injection at this time, particularly due to DM   3. Sleep disturbance             Recently new mattress             Cont CPAP  Improved   4. Myalgia  Will schedule for trigger point injections   5. Diabetic neuropathy             See #1, #3             Side effects with elavil to 50mg  qhs, continue 25mg  educated on signs/symptoms of seratonin syndrome previously  Refilled Cymbalta.  -Discussed current symptoms of pain and history of pain.  -Discussed benefits of exercise in reducing pain. -Discussed following foods that may reduce pain: 1) Ginger (especially studied for arthritis)- reduce leukotriene production to decrease inflammation 2) Blueberries- high in phytonutrients that decrease inflammation 3) Salmon- marine omega-3s reduce joint swelling and pain 4) Pumpkin seeds- reduce inflammation 5) dark chocolate- reduces inflammation 6) turmeric- reduces inflammation 7) tart cherries - reduce pain and stiffness 8) extra virgin olive oil - its compound olecanthal helps to block prostaglandins  9) chili peppers- can be eaten or applied topically via capsaicin 10) mint- helpful for headache, muscle aches, joint pain, and itching 11) garlic- reduces inflammation  Link to further information on diet for chronic pain: [email or web address]    6. Morbid obesity             Not interested in seeing dietitian at this time  Encouraged activity   No attempts at weight  loss   7. Gait abnormality             Completed therapies, cont HEP  Continue cane for safety   8. Nonhealing ulcer             Left lateral foot, improving  9. Lightheadedness  Encouraged BP monitoring at home

## 2021-07-25 ENCOUNTER — Ambulatory Visit (AMBULATORY_SURGERY_CENTER): Payer: Medicare Other | Admitting: Gastroenterology

## 2021-07-25 ENCOUNTER — Encounter: Payer: Self-pay | Admitting: Gastroenterology

## 2021-07-25 ENCOUNTER — Other Ambulatory Visit: Payer: Self-pay

## 2021-07-25 ENCOUNTER — Other Ambulatory Visit: Payer: Self-pay | Admitting: Gastroenterology

## 2021-07-25 VITALS — BP 132/60 | HR 65 | Temp 97.5°F | Resp 20 | Ht 71.0 in | Wt 262.0 lb

## 2021-07-25 DIAGNOSIS — D123 Benign neoplasm of transverse colon: Secondary | ICD-10-CM

## 2021-07-25 DIAGNOSIS — Z86010 Personal history of colonic polyps: Secondary | ICD-10-CM

## 2021-07-25 DIAGNOSIS — D122 Benign neoplasm of ascending colon: Secondary | ICD-10-CM

## 2021-07-25 MED ORDER — SODIUM CHLORIDE 0.9 % IV SOLN: 500.0000 mL | Freq: Once | INTRAVENOUS | Status: DC

## 2021-07-25 NOTE — Patient Instructions (Signed)
YOU HAD AN ENDOSCOPIC PROCEDURE TODAY AT THE [facility]:   Refer to the procedure report that was given to you for any specific questions about what was found during the examination.  If the procedure report does not answer your questions, please call your gastroenterologist to clarify.  If you requested that your care partner not be given the details of your procedure findings, then the procedure report has been included in a sealed envelope for you to review at your convenience later.  YOU SHOULD EXPECT: Some feelings of bloating in the abdomen. Passage of more gas than usual.  Walking can help get rid of the air that was put into your GI tract during the procedure and reduce the bloating. If you had a lower endoscopy (such as a colonoscopy or flexible sigmoidoscopy) you may notice spotting of blood in your stool or on the toilet paper. If you underwent a bowel prep for your procedure, you may not have a normal bowel movement for a few days.  Please Note:  You might notice some irritation and congestion in your nose or some drainage.  This is from the oxygen used during your procedure.  There is no need for concern and it should clear up in a day or so.  SYMPTOMS TO REPORT IMMEDIATELY:   Following lower endoscopy (colonoscopy or flexible sigmoidoscopy):  Excessive amounts of blood in the stool  Significant tenderness or worsening of abdominal pains  Swelling of the abdomen that is new, acute  Fever of 100F or higher    For urgent or emergent issues, a gastroenterologist can be reached at any hour by calling [phone]. Do not use MyChart messaging for urgent concerns.    DIET:  We do recommend a small meal at first, but then you may proceed to your regular diet.  Drink plenty of fluids but you should avoid alcoholic beverages for 24 hours.  ACTIVITY:  You should plan to take it easy for the rest of today and you should NOT DRIVE or use heavy machinery until tomorrow  (because of the sedation medicines used during the test).    FOLLOW UP: Our staff will call the number listed on your records 48-72 hours following your procedure to check on you and address any questions or concerns that you may have regarding the information given to you following your procedure. If we do not reach you, we will leave a message.  We will attempt to reach you two times.  During this call, we will ask if you have developed any symptoms of COVID 19. If you develop any symptoms (ie: fever, flu-like symptoms, shortness of breath, cough etc.) before then, please call [phone].  If you test positive for Covid 19 in the 2 weeks post procedure, please call and report this information to us.    If any biopsies were taken you will be contacted by phone or by letter within the next 1-3 weeks.  Please call us at [phone] if you have not heard about the biopsies in 3 weeks.    SIGNATURES/CONFIDENTIALITY: You and/or your care partner have signed paperwork which will be entered into your electronic medical record.  These signatures attest to the fact that that the information above on your After Visit Summary has been reviewed and is understood.  Full responsibility of the confidentiality of this discharge information lies with you and/or your care-partner.   Resume medications. Information given on polyps and diverticulosis. 

## 2021-07-25 NOTE — Op Note (Signed)
[place] Patient Name: [name] Procedure Date: 07/25/2021 10:46 AM MRN: [id number] Endoscopist: Mallie Mussel L. Loletha Carrow , MD Age: 80 Referring MD:  Date of Birth: 08/25/1941 Gender: Male Account #: [id number] Procedure:                Colonoscopy Indications:              Surveillance: Personal history of adenomatous                            polyps on last colonoscopy > 5 years ago                           <64mm TA last colonoscopy 04/2015 Medicines:                Monitored Anesthesia Care Procedure:                Pre-Anesthesia Assessment:                           - Prior to the procedure, a History and Physical                            was performed, and patient medications and                            allergies were reviewed. The patient's tolerance of                            previous anesthesia was also reviewed. The risks                            and benefits of the procedure and the sedation                            options and risks were discussed with the patient.                            All questions were answered, and informed consent                            was obtained. Prior Anticoagulants: The patient has                            taken no previous anticoagulant or antiplatelet                            agents. ASA Grade Assessment: III - A patient with                            severe systemic disease. After reviewing the risks                            and benefits, the patient was deemed in  satisfactory condition to undergo the procedure.                           After obtaining informed consent, the colonoscope                            was passed under direct vision. Throughout the                            procedure, the patient's blood pressure, pulse, and                            oxygen saturations were monitored continuously. The                            CF [id number] #[id number] was introduced through  the anus                            and advanced to the the cecum, identified by                            appendiceal orifice and ileocecal valve. The                            colonoscopy was somewhat difficult due to initially                            fair bowel prep and a redundant colon. Successful                            completion of the procedure was aided by using                            manual pressure and straightening and shortening                            the scope to obtain bowel loop reduction. The                            patient tolerated the procedure well. The quality                            of the bowel preparation was initially fair, then                            improved to good with lavage. The ileocecal valve,                            appendiceal orifice, and rectum were photographed.                            The bowel preparation used was GoLYTELY via split  dose instruction. Scope In: 10:52:23 AM Scope Out: 11:22:55 AM Scope Withdrawal Time: 0 hours 24 minutes 28 seconds  Total Procedure Duration: 0 hours 30 minutes 32 seconds  Findings:                 The perianal and digital rectal examinations were                            normal.                           Multiple diverticula were found in the left colon                            and right colon.                           Two sessile polyps were found in the ascending                            colon. The polyps were 3 to 5 mm in size. These                            polyps were removed with a cold snare. Resection                            and retrieval were complete. (Jar 1)                           A 12 mm polyp was found in the proximal ascending                            colon. The polyp was semi-pedunculated. The polyp                            was removed with a hot snare. Resection and                            retrieval were complete. (Jar  1)                           Two sessile polyps were found in the ascending                            colon. The polyps were 3 to 5 mm in size. These                            polyps were removed with a cold snare. Resection                            and retrieval were complete. (Jar 2)                           A 10 mm polyp was found in the ascending colon.  The                            polyp was semi-pedunculated. The polyp was removed                            with a hot snare. Resection and retrieval were                            complete. (Jar 2)                           A diminutive polyp was found in the transverse                            colon. The polyp was sessile. The polyp was removed                            with a cold snare. Resection and retrieval were                            complete. (Jar 3)                           Two pedunculated polyps were found in the                            transverse colon. The polyps were 8 to 10 mm in                            size. These polyps were removed with a hot snare.                            Resection and retrieval were complete. (Jar 3)                           A 12 mm polyp was found in the transverse colon.                            The polyp was pedunculated. The polyp was removed                            with a hot snare. Resection and retrieval were                            complete.                           The exam was otherwise without abnormality on                            direct and retroflexion views. Complications:            No immediate complications. Estimated Blood Loss:     Estimated blood loss was minimal.  Impression:               - Diverticulosis in the left colon and in the right                            colon.                           - Two 3 to 5 mm polyps in the ascending colon,                            removed with a cold snare. Resected and retrieved.                            - One 12 mm polyp in the proximal ascending colon,                            removed with a hot snare. Resected and retrieved.                           - Two 3 to 5 mm polyps in the ascending colon,                            removed with a cold snare. Resected and retrieved.                           - One 10 mm polyp in the ascending colon, removed                            with a hot snare. Resected and retrieved.                           - One diminutive polyp in the transverse colon,                            removed with a cold snare. Resected and retrieved.                           - Two 8 to 10 mm polyps in the transverse colon,                            removed with a hot snare. Resected and retrieved.                           - One 12 mm polyp in the transverse colon, removed                            with a hot snare. Resected and retrieved.                           - The examination was otherwise normal on direct  and retroflexion views. Recommendation:           - Patient has a contact number available for                            emergencies. The signs and symptoms of potential                            delayed complications were discussed with the                            patient. Return to normal activities tomorrow.                            Written discharge instructions were provided to the                            patient.                           - Resume previous diet.                           - Continue present medications.                           - Await pathology results.                           - No repeat surveillance colonoscopy due to age and                            medical conditions. Hansen Carino L. Loletha Carrow, MD 07/25/2021 11:39:14 AM This report has been signed electronically.

## 2021-07-25 NOTE — Progress Notes (Signed)
Pt's states no medical or surgical changes since previsit or office visit. 

## 2021-07-25 NOTE — Progress Notes (Signed)
Pt Drowsy. VSS. To PACU, report to RN. No anesthetic complications noted. Bovie site clear

## 2021-07-25 NOTE — Progress Notes (Signed)
Called to room to assist during endoscopic procedure.  Patient ID and intended procedure confirmed with present staff. Received instructions for my participation in the procedure from the performing physician.  

## 2021-07-25 NOTE — Progress Notes (Signed)
C.W. vital signs. 

## 2021-07-25 NOTE — Progress Notes (Signed)
History and Physical:  This patient presents for endoscopic testing for: Encounter Diagnosis  Name Primary?   Personal history of colonic polyps Yes    Patient denies chest pain or dyspnea. 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[place])    Duodenitis    Edema 05/12/2008   Esophageal stricture    GERD (gastroesophageal reflux disease)    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   Gynecomastia    Hepatic steatosis    HYPERLIPIDEMIA 05/20/2007   Hypertension    Hypogonadism male    Morbid obesity ([place]) 09/10/2009   OBSTRUCTIVE SLEEP APNEA 11/04/2007   Personal history of colonic polyps    RENAL INSUFFICIENCY 05/12/2008   Sleep apnea    uses cpap   Squamous cell carcinoma of skin 02/16/2020   left lower leg, anterior-cx3,66fu   Urolithiasis      Past Surgical History: Past Surgical History:  Procedure Laterality Date   Abdominal US  09/1997   arm fracture Left 1958   with hardware   Colon cancer screening     COLONOSCOPY  08/18/2004   diverticulitis   COLONOSCOPY  09/24/2009   ELECTROCARDIOGRAM  02/2006   FLEXIBLE SIGMOIDOSCOPY  03/04/2001   polyps, anal fissure   GANGLION CYST EXCISION Left 1980   HIP PINNING,CANNULATED Right 04/25/2021   Procedure: CANNULATED HIP PINNING;  Surgeon: Rod Can, MD;  Location: [facility];  Service: Orthopedics;  Laterality: Right;   Lower Arterial  04/13/2004   POLYPECTOMY     Rest Cardiolite  03/19/2003    Allergies: Allergies  Allergen Reactions   Lipitor [Atorvastatin] Other (See Comments)    unknown   Niacin     REACTION: Severe heartburn   Pioglitazone     REACTION: Edema    Outpatient  Meds: Current Outpatient Medications  Medication Sig Dispense Refill   Accu-Chek FastClix Lancets MISC USE TO CHECK BLOOD SUGAR UP TO 6 TIMES DAILY 612 each 3   Blood Glucose Monitoring Suppl (ACCU-CHEK AVIVA PLUS) w/Device KIT Use as directed 1 kit 0   Continuous Blood Gluc Sensor (DEXCOM G6 SENSOR) MISC Inject 1 Device into the skin as directed. Apply to skin SQ every 10 days 9 each 3   fluticasone (FLONASE) 50 MCG/ACT nasal spray Place 1 spray into the nose daily as needed for allergies.      furosemide (LASIX) 20 MG tablet TAKE 1 TABLET BY MOUTH  DAILY 90 tablet 3   glucose blood (ACCU-CHEK AVIVA PLUS) test strip USE TO TEST BLOOD SUGAR 6  TIMES PER DAY; E11.9 600 each 11   HUMALOG 100 UNIT/ML injection INJECT SUBCUTANEOUSLY VIA  INSULIN PUMP TOTAL OF 120  UNITS PER DAY 110 mL 3   Insulin Infusion Pump (T: SLIM X2 INS PMP/CONTROL 7.4) DEVI by Does not apply route.     Insulin Infusion Pump Supplies (AUTOSOFT XC INFUSION SET) MISC Inject 1 Act into the skin every other day. Use with 31mm cannula and 23 inch tubing. *5 boxes (90 day supply) 5 each 3   Insulin Infusion Pump Supplies (T:SLIM INSULIN CARTRIDGE 3ML) MISC Use with insulin pump, fill  once every 2 days. 5 each 3   Multiple Vitamins-Minerals (CENTRUM SILVER PO) Take 1 tablet by mouth daily.     Omega-3 Fatty Acids (FISH OIL) 1200 MG CAPS Take 1,200 mg by mouth daily.     alfuzosin (UROXATRAL) 10 MG 24 hr tablet TAKE 1 TABLET BY MOUTH  DAILY WITH BREAKFAST 90 tablet 3   allopurinol (ZYLOPRIM) 300 MG tablet TAKE 1 TABLET BY MOUTH  DAILY 90 tablet 3   amitriptyline (ELAVIL) 50 MG tablet TAKE ONE-HALF TABLET BY  MOUTH AT BEDTIME 45 tablet 3   cetirizine (ZYRTEC) 10 MG tablet Take 10 mg by mouth daily as needed for allergies.     diclofenac Sodium (VOLTAREN) 1 % GEL Apply 2 g topically 4 (four) times daily. 150 g 3   Dulaglutide (TRULICITY) 1.5 TI/4.5YK SOPN Inject 1.5 mg into the skin once a week. 6 mL 3   DULoxetine (CYMBALTA) 60 MG  capsule Take 1 capsule (60 mg total) by mouth daily. 90 capsule 3   folic acid (FOLVITE) 1 MG tablet Take 1 mg by mouth daily.     losartan (COZAAR) 25 MG tablet Take 12.5 mg by mouth daily.     omeprazole (PRILOSEC) 20 MG capsule Take 1 capsule (20 mg total) by mouth daily as needed. 90 capsule 2   rosuvastatin (CRESTOR) 40 MG tablet TAKE 1 TABLET BY MOUTH AT  BEDTIME 90 tablet 3   vitamin B-12 (CYANOCOBALAMIN) 1000 MCG tablet Take 1,000 mcg by mouth daily.     Current Facility-Administered Medications  Medication Dose Route Frequency Provider Last Rate Last Admin   0.9 %  sodium chloride infusion  500 mL Intravenous Once Nelida Meuse III, MD          ___________________________________________________________________ Objective   Exam:  BP (!) 160/57   Pulse 83   Temp (!) 97.5 F (36.4 C)   Ht _0  (1.803 m)   Wt 262 lb (118.8 kg)   SpO2 98%   BMI 36.54 kg/m   CV: RRR without murmur, S1/S2 Resp: clear to auscultation bilaterally, normal RR and effort noted GI: soft, no tenderness, with active bowel sounds.   Assessment: Encounter Diagnosis  Name Primary?   Personal history of colonic polyps Yes     Plan: Colonoscopy  The benefits and risks of the planned procedure were described in detail with the patient or (when appropriate) their health care proxy.  Risks were outlined as including, but not limited to, bleeding, infection, perforation, adverse medication reaction leading to cardiac or pulmonary decompensation, pancreatitis (if ERCP).  The limitation of incomplete mucosal visualization was also discussed.  No guarantees or warranties were given.    The patient is appropriate for an endoscopic procedure in the ambulatory setting.   - Wilfrid Lund, MD

## 2021-07-27 ENCOUNTER — Telehealth: Payer: Self-pay

## 2021-07-27 NOTE — Telephone Encounter (Signed)
Unable to leave message mailbox is full.

## 2021-07-29 ENCOUNTER — Encounter: Payer: Self-pay | Admitting: Gastroenterology

## 2021-08-04 ENCOUNTER — Other Ambulatory Visit: Payer: Self-pay

## 2021-08-04 ENCOUNTER — Ambulatory Visit
Admission: EM | Admit: 2021-08-04 | Discharge: 2021-08-04 | Disposition: A | Discharge status: Other Acute Inpatient Hospital | Payer: Medicare Other

## 2021-08-06 ENCOUNTER — Other Ambulatory Visit: Payer: Self-pay | Admitting: Physical Medicine & Rehabilitation

## 2021-08-10 ENCOUNTER — Telehealth: Payer: Self-pay | Admitting: Pharmacy Technician

## 2021-08-10 ENCOUNTER — Other Ambulatory Visit (HOSPITAL_COMMUNITY): Payer: Self-pay

## 2021-08-10 NOTE — Telephone Encounter (Signed)
Patient Advocate Encounter   Received notification from CoverMyMeds that prior authorization for Trulicity 4.5mg  is required.   From what I can find in his chart, pt is now on Turlicity 1.5mg . No PA for 4.5mg  needed at this time.  Per Test caim, it also goes through to the ins without telling me it needs a PA. High Copay. $236.96.

## 2021-08-19 ENCOUNTER — Ambulatory Visit: Payer: Medicare Other | Admitting: Family Medicine

## 2021-08-21 ENCOUNTER — Encounter: Payer: Self-pay | Admitting: Endocrinology

## 2021-08-22 ENCOUNTER — Ambulatory Visit (INDEPENDENT_AMBULATORY_CARE_PROVIDER_SITE_OTHER): Payer: Medicare Other | Admitting: Endocrinology

## 2021-08-22 ENCOUNTER — Encounter: Payer: Medicare Other | Admitting: Physical Medicine and Rehabilitation

## 2021-08-22 ENCOUNTER — Other Ambulatory Visit: Payer: Self-pay

## 2021-08-22 VITALS — BP 110/64 | HR 96 | Ht 71.0 in | Wt 266.8 lb

## 2021-08-22 DIAGNOSIS — E0800 Diabetes mellitus due to underlying condition with hyperosmolarity without nonketotic hyperglycemic-hyperosmolar coma (NKHHC): Secondary | ICD-10-CM | POA: Diagnosis not present

## 2021-08-22 LAB — POCT GLYCOSYLATED HEMOGLOBIN (HGB A1C): Hemoglobin A1C: 6.6 % — AB (ref 4.0–5.6)

## 2021-08-22 MED ORDER — TRULICITY 4.5 MG/0.5ML ~~LOC~~ SOAJ: 4.5000 mg | SUBCUTANEOUS | 3 refills | Status: DC

## 2021-08-22 NOTE — Progress Notes (Signed)
Subjective:    Patient ID: [name], male    DOB: 12/22/1940, 80 y.o.   MRN: [id number]  HPI Pt returns for f/u of diabetes mellitus:  DM type: Insulin-requiring type 2.   Dx'ed: 3500 Complications: PN, CAD, and stage 4 CRI.   Therapy: insulin since 1995.  DKA: never.  Severe hypoglycemia: never.  Pancreatitis: never.  Other: he started pump therapy (now T-slim, since mid-2015), and dexcom G-6 continuous glucose monitor.   Interval history: he takes these pump settings:   basal rate of 1.7 units/hr (when not in "control-IQ" mode).   mealtime bolus of 1 unit/4 grams carbohydrate.  continue correction bolus (which some people call "sensitivity," or "insulin sensitivity ratio," or just "isr") of 1 unit for each 40 by which your glucose exceeds 100.   "Control-IQ" is on 90% of the time TDD is approx 88 units (52% basal, 42% meal bolus, 1% correction bolus).   I reviewed continuous glucose monitor data.  Glucose varies from 45-330, but most are in the 100's.  It is in general highest at 11AM and 3PM.  it is lowest 1AM-8AM He seldom has hypoglycemia, and these episodes are mild.   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[place])    Duodenitis    Edema 05/12/2008   Esophageal stricture    GERD (gastroesophageal reflux disease)    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   Gynecomastia    Hepatic steatosis    HYPERLIPIDEMIA 05/20/2007   Hypertension    Hypogonadism male    Morbid obesity ([place]) 09/10/2009   OBSTRUCTIVE SLEEP APNEA 11/04/2007   Personal history of colonic polyps    RENAL INSUFFICIENCY 05/12/2008   Sleep apnea    uses cpap   Squamous cell carcinoma of skin 02/16/2020   left lower leg, anterior-cx3,41f   Urolithiasis     Past  Surgical History:  Procedure Laterality Date   Abdominal [place] 09/1997   arm fracture Left 1958   with hardware   Colon cancer screening     COLONOSCOPY  08/18/2004   diverticulitis   COLONOSCOPY  09/24/2009   ELECTROCARDIOGRAM  02/2006   FLEXIBLE SIGMOIDOSCOPY  03/04/2001   polyps, anal fissure   GANGLION CYST EXCISION Left 1980   HIP PINNING,CANNULATED Right 04/25/2021   Procedure: CANNULATED HIP PINNING;  Surgeon: SRod Can MD;  Location: [facility];  Service: Orthopedics;  Laterality: Right;   Lower Arterial  04/13/2004   POLYPECTOMY     Rest Cardiolite  03/19/2003    Social History   Socioeconomic History   Marital status: Married    Spouse name: Not on file   Number of children: Not on file   Years of education: Not on file   Highest education level: Not on file  Occupational History    Employer: [employer]  Tobacco Use   Smoking status: Former    Packs/day: 2.00    Years: 31.00    Pack years: 62.00    Types: Cigarettes    Start date: 112   Quit date: 10/23/1982    Years since quitting: 38.8   Smokeless tobacco: Never  Vaping Use   Vaping Use: Never used  Substance and Sexual  Activity   Alcohol use: No    Alcohol/week: 0.0 standard drinks   Drug use: No   Sexual activity: Not on file  Other Topics Concern   Not on file  Social History Narrative   Lives with wife in a one story home.  Has a son and a daughter.     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     Education: 2 years of college.   Social Determinants of Health   Financial Resource Strain: Low Risk    Difficulty of Paying Living Expenses: Not hard at all  Food Insecurity: No Food Insecurity   Worried About Charity fundraiser in the Last Year: Never true   [place] in the Last Year: Never true  Transportation Needs: No Transportation Needs   Lack of Transportation (Medical): No   Lack of Transportation (Non-Medical): No  Physical Activity: Inactive   Days of  Exercise per Week: 0 days   Minutes of Exercise per Session: 0 min  Stress: No Stress Concern Present   Feeling of Stress : Not at all  Social Connections: Moderately Integrated   Frequency of Communication with Friends and Family: More than three times a week   Frequency of Social Gatherings with Friends and Family: More than three times a week   Attends Religious Services: Never   Marine scientist or Organizations: Yes   Attends Music therapist: More than 4 times per year   Marital Status: Married  Human resources officer Violence: Not At Risk   Fear of Current or Ex-Partner: No   Emotionally Abused: No   Physically Abused: No   Sexually Abused: No    Current Outpatient Medications on File Prior to Visit  Medication Sig Dispense Refill   Accu-Chek FastClix Lancets MISC USE TO CHECK BLOOD SUGAR UP TO 6 TIMES DAILY 612 each 3   alfuzosin (UROXATRAL) 10 MG 24 hr tablet TAKE 1 TABLET BY MOUTH  DAILY WITH BREAKFAST 90 tablet 3   allopurinol (ZYLOPRIM) 300 MG tablet TAKE 1 TABLET BY MOUTH  DAILY 90 tablet 3   amitriptyline (ELAVIL) 50 MG tablet TAKE ONE-HALF TABLET BY  MOUTH AT BEDTIME 45 tablet 3   Blood Glucose Monitoring Suppl (ACCU-CHEK AVIVA PLUS) w/Device KIT Use as directed 1 kit 0   cetirizine (ZYRTEC) 10 MG tablet Take 10 mg by mouth daily as needed for allergies.     Continuous Blood Gluc Sensor (DEXCOM G6 SENSOR) MISC Inject 1 Device into the skin as directed. Apply to skin SQ every 10 days 9 each 3   diclofenac Sodium (VOLTAREN) 1 % GEL Apply 2 g topically 4 (four) times daily. 150 g 3   DULoxetine (CYMBALTA) 60 MG capsule TAKE 1 CAPSULE BY MOUTH  DAILY 90 capsule 3   fluticasone (FLONASE) 50 MCG/ACT nasal spray Place 1 spray into the nose daily as needed for allergies.      folic acid (FOLVITE) 1 MG tablet Take 1 mg by mouth daily.     furosemide (LASIX) 20 MG tablet TAKE 1 TABLET BY MOUTH  DAILY 90 tablet 3   glucose blood (ACCU-CHEK AVIVA PLUS) test strip USE  TO TEST BLOOD SUGAR 6  TIMES PER DAY; E11.9 600 each 11   HUMALOG 100 UNIT/ML injection INJECT SUBCUTANEOUSLY VIA  INSULIN PUMP TOTAL OF 120  UNITS PER DAY 110 mL 3   Insulin Infusion Pump (T: SLIM X2 INS PMP/CONTROL 7.4) DEVI by Does not apply route.  Insulin Infusion Pump Supplies (AUTOSOFT XC INFUSION SET) MISC Inject 1 Act into the skin every other day. Use with 69m cannula and 23 inch tubing. *5 boxes (90 day supply) 5 each 3   Insulin Infusion Pump Supplies (T:SLIM INSULIN CARTRIDGE 3ML) MISC Use with insulin pump, fill once every 2 days. 5 each 3   losartan (COZAAR) 25 MG tablet Take 12.5 mg by mouth daily.     Multiple Vitamins-Minerals (CENTRUM SILVER PO) Take 1 tablet by mouth daily.     Omega-3 Fatty Acids (FISH OIL) 1200 MG CAPS Take 1,200 mg by mouth daily.     omeprazole (PRILOSEC) 20 MG capsule Take 1 capsule (20 mg total) by mouth daily as needed. 90 capsule 2   rosuvastatin (CRESTOR) 40 MG tablet TAKE 1 TABLET BY MOUTH AT  BEDTIME 90 tablet 3   vitamin B-12 (CYANOCOBALAMIN) 1000 MCG tablet Take 1,000 mcg by mouth daily.     No current facility-administered medications on file prior to visit.    Allergies  Allergen Reactions   Lipitor [Atorvastatin] Other (See Comments)    unknown   Niacin     REACTION: Severe heartburn   Pioglitazone     REACTION: Edema    Family History  Problem Relation Age of Onset   Colon cancer Brother 522  Colon polyps Brother    Lung cancer Brother    Other Mother        MVA, deceased 553s  Healthy Daughter    Healthy Son    Rectal cancer Neg Hx    Stomach cancer Neg Hx     BP 110/64 (BP Location: Right Arm, Patient Position: Sitting, Cuff Size: Large)   Pulse 96   Ht '5\' 11"'  (1.803 m)   Wt 266 lb 12.8 oz (121 kg)   SpO2 97%   BMI 37.21 kg/m    Review of Systems Denies N/V/HB    Objective:   Physical Exam Pulses: dorsalis pedis intact bilat.   MSK: no deformity of the feet CV: 1+ bilat leg edema Skin:  no ulcer on the  feet.  normal color and temp on the feet.   Neuro: sensation is intact to touch on the feet, but decreased from normal.     Lab Results  Component Value Date   HGBA1C 6.6 (A) 08/22/2021      Assessment & Plan:  Insulin-requiring type 2 DM Hypoglycemia, due to insulin, especially control-IQ function   Patient Instructions  Please take these pump settings:  basal rate of 1.5 units/hr (when not in "control-IQ" mode).   mealtime bolus of 1 unit/4 grams carbohydrate.  continue correction bolus (which some people call "sensitivity," or "insulin sensitivity ratio," or just "isr") of 1 unit for each 60 by which your glucose exceeds 100.   Please increase the goal blood sugar in control IQ to 130, and continue the same Trulicity  Please come back for a follow-up appointment in 3 months.

## 2021-08-22 NOTE — Patient Instructions (Addendum)
Please take these pump settings:  basal rate of 1.5 units/hr (when not in "control-IQ" mode).   mealtime bolus of 1 unit/4 grams carbohydrate.  continue correction bolus (which some people call "sensitivity," or "insulin sensitivity ratio," or just "isr") of 1 unit for each 60 by which your glucose exceeds 100.   Please increase the goal blood sugar in control IQ to 130, and continue the same Trulicity  Please come back for a follow-up appointment in 3 months.

## 2021-08-23 ENCOUNTER — Other Ambulatory Visit: Payer: Self-pay

## 2021-08-24 ENCOUNTER — Encounter: Payer: Self-pay | Admitting: Family Medicine

## 2021-08-24 ENCOUNTER — Ambulatory Visit (INDEPENDENT_AMBULATORY_CARE_PROVIDER_SITE_OTHER): Payer: Medicare Other | Admitting: Family Medicine

## 2021-08-24 VITALS — BP 124/70 | HR 88 | Temp 98.0°F | Ht 71.0 in | Wt 265.8 lb

## 2021-08-24 DIAGNOSIS — E538 Deficiency of other specified B group vitamins: Secondary | ICD-10-CM | POA: Diagnosis not present

## 2021-08-24 DIAGNOSIS — Z8739 Personal history of other diseases of the musculoskeletal system and connective tissue: Secondary | ICD-10-CM

## 2021-08-24 DIAGNOSIS — E860 Dehydration: Secondary | ICD-10-CM | POA: Diagnosis not present

## 2021-08-24 DIAGNOSIS — E7800 Pure hypercholesterolemia, unspecified: Secondary | ICD-10-CM | POA: Diagnosis not present

## 2021-08-24 DIAGNOSIS — E559 Vitamin D deficiency, unspecified: Secondary | ICD-10-CM | POA: Diagnosis not present

## 2021-08-24 DIAGNOSIS — N1831 Chronic kidney disease, stage 3a: Secondary | ICD-10-CM | POA: Diagnosis not present

## 2021-08-24 LAB — URINALYSIS, ROUTINE W REFLEX MICROSCOPIC
Bilirubin Urine: NEGATIVE
Hgb urine dipstick: NEGATIVE
Ketones, ur: NEGATIVE
Nitrite: NEGATIVE
RBC / HPF: NONE SEEN (ref 0–?)
Specific Gravity, Urine: 1.015 (ref 1.000–1.030)
Urine Glucose: NEGATIVE
Urobilinogen, UA: 1 (ref 0.0–1.0)
pH: 6 (ref 5.0–8.0)

## 2021-08-24 LAB — VITAMIN D 25 HYDROXY (VIT D DEFICIENCY, FRACTURES): VITD: 35.84 ng/mL (ref 30.00–100.00)

## 2021-08-24 LAB — CBC
HCT: 42.7 % (ref 39.0–52.0)
Hemoglobin: 14.3 g/dL (ref 13.0–17.0)
MCHC: 33.5 g/dL (ref 30.0–36.0)
MCV: 91.5 fl (ref 78.0–100.0)
Platelets: 135 10*3/uL — ABNORMAL LOW (ref 150.0–400.0)
RBC: 4.67 Mil/uL (ref 4.22–5.81)
RDW: 15.3 % (ref 11.5–15.5)
WBC: 5.1 10*3/uL (ref 4.0–10.5)

## 2021-08-24 LAB — BASIC METABOLIC PANEL
BUN: 17 mg/dL (ref 6–23)
CO2: 29 mEq/L (ref 19–32)
Calcium: 9 mg/dL (ref 8.4–10.5)
Chloride: 103 mEq/L (ref 96–112)
Creatinine, Ser: 1.52 mg/dL — ABNORMAL HIGH (ref 0.40–1.50)
GFR: 43.21 mL/min — ABNORMAL LOW (ref >60.00)
Glucose, Bld: 108 mg/dL — ABNORMAL HIGH (ref 70–99)
Potassium: 3.7 mEq/L (ref 3.5–5.1)
Sodium: 141 mEq/L (ref 135–145)

## 2021-08-24 LAB — MICROALBUMIN / CREATININE URINE RATIO
Creatinine,U: 155.7 mg/dL
Microalb Creat Ratio: 2.8 mg/g (ref 0.0–30.0)
Microalb, Ur: 4.3 mg/dL — ABNORMAL HIGH (ref 0.0–1.9)

## 2021-08-24 LAB — VITAMIN B12: Vitamin B-12: 392 pg/mL (ref 211–911)

## 2021-08-24 LAB — LDL CHOLESTEROL, DIRECT: Direct LDL: 76 mg/dL

## 2021-08-24 NOTE — Progress Notes (Signed)
Established Patient Office Visit  Subjective:  Patient ID: [name], male    DOB: 1941/06/30  Age: 80 y.o. MRN: [id number]  CC:  Chief Complaint  Patient presents with   Follow-up    6 month follow up, no concerns.     HPI [name] presents for follow-up of vitamin D and B12 deficiencies, CKD with recent history of dehydration, and elevated ldl cholesterol and history of gout.  Continues allopurinol for control of uric acid.  On low-dose losartan for renal preservation.  In his rosuvastatin for cholesterol.  Has been trying to hydrate more.  Continues follow-up with urology for BPH.  Continues follow-up with physical medicine for chronic lower back pain various arthritides.  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[place])    Duodenitis    Edema 05/12/2008   Esophageal stricture    GERD (gastroesophageal reflux disease)    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   Gynecomastia    Hepatic steatosis    HYPERLIPIDEMIA 05/20/2007   Hypertension    Hypogonadism male    Morbid obesity ([place]) 09/10/2009   OBSTRUCTIVE SLEEP APNEA 11/04/2007   Personal history of colonic polyps    RENAL INSUFFICIENCY 05/12/2008   Sleep apnea    uses cpap   Squamous cell carcinoma of skin 02/16/2020   left lower leg, anterior-cx3,61f   Urolithiasis     Past Surgical History:  Procedure Laterality Date   Abdominal UKorea 09/1997   arm fracture Left 1958   with hardware   Colon cancer screening     COLONOSCOPY  08/18/2004   diverticulitis   COLONOSCOPY  09/24/2009   ELECTROCARDIOGRAM  02/2006   FLEXIBLE SIGMOIDOSCOPY  03/04/2001   polyps, anal fissure   GANGLION CYST EXCISION Left 1980   HIP PINNING,CANNULATED Right 04/25/2021   Procedure: CANNULATED HIP PINNING;   Surgeon: SRod Can MD;  Location: [facility];  Service: Orthopedics;  Laterality: Right;   Lower Arterial  04/13/2004   POLYPECTOMY     Rest Cardiolite  03/19/2003    Family History  Problem Relation Age of Onset   Colon cancer Brother 53  Colon polyps Brother    Lung cancer Brother    Other Mother        MVA, deceased 517s  Healthy Daughter    Healthy Son    Rectal cancer Neg Hx    Stomach cancer Neg Hx     Social History   Socioeconomic History   Marital status: Married    Spouse name: Not on file   Number of children: Not on file   Years of education: Not on file   Highest education level: Not on file  Occupational History    Employer: [employer]  Tobacco Use   Smoking status: Former    Packs/day: 2.00    Years: 31.00    Pack years: 62.00    Types: Cigarettes    Start date: 1955    Quit date: 10/23/1982    Years since quitting: 38.8   Smokeless tobacco: Never  Vaping Use   Vaping Use: Never used  Substance and Sexual Activity   Alcohol use: No    Alcohol/week: 0.0 standard drinks  Drug use: No   Sexual activity: Not on file  Other Topics Concern   Not on file  Social History Narrative   Lives with wife in a one story home.  Has a son and a daughter.     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     Education: 2 years of college.   Social Determinants of Health   Financial Resource Strain: Low Risk    Difficulty of Paying Living Expenses: Not hard at all  Food Insecurity: No Food Insecurity   Worried About Charity fundraiser in the Last Year: Never true   [place] in the Last Year: Never true  Transportation Needs: No Transportation Needs   Lack of Transportation (Medical): No   Lack of Transportation (Non-Medical): No  Physical Activity: Inactive   Days of Exercise per Week: 0 days   Minutes of Exercise per Session: 0 min  Stress: No Stress Concern Present   Feeling of Stress : Not at all  Social Connections:  Moderately Integrated   Frequency of Communication with Friends and Family: More than three times a week   Frequency of Social Gatherings with Friends and Family: More than three times a week   Attends Religious Services: Never   Marine scientist or Organizations: Yes   Attends Music therapist: More than 4 times per year   Marital Status: Married  Human resources officer Violence: Not At Risk   Fear of Current or Ex-Partner: No   Emotionally Abused: No   Physically Abused: No   Sexually Abused: No    Outpatient Medications Prior to Visit  Medication Sig Dispense Refill   Accu-Chek FastClix Lancets MISC USE TO CHECK BLOOD SUGAR UP TO 6 TIMES DAILY 612 each 3   alfuzosin (UROXATRAL) 10 MG 24 hr tablet TAKE 1 TABLET BY MOUTH  DAILY WITH BREAKFAST 90 tablet 3   allopurinol (ZYLOPRIM) 300 MG tablet TAKE 1 TABLET BY MOUTH  DAILY 90 tablet 3   amitriptyline (ELAVIL) 50 MG tablet TAKE ONE-HALF TABLET BY  MOUTH AT BEDTIME 45 tablet 3   Blood Glucose Monitoring Suppl (ACCU-CHEK AVIVA PLUS) w/Device KIT Use as directed 1 kit 0   cetirizine (ZYRTEC) 10 MG tablet Take 10 mg by mouth daily as needed for allergies.     Continuous Blood Gluc Sensor (DEXCOM G6 SENSOR) MISC Inject 1 Device into the skin as directed. Apply to skin SQ every 10 days 9 each 3   diclofenac Sodium (VOLTAREN) 1 % GEL Apply 2 g topically 4 (four) times daily. 150 g 3   Dulaglutide (TRULICITY) 4.5 EU/2.3NT SOPN Inject 4.5 mg as directed once a week. 6 mL 3   DULoxetine (CYMBALTA) 60 MG capsule TAKE 1 CAPSULE BY MOUTH  DAILY 90 capsule 3   fluticasone (FLONASE) 50 MCG/ACT nasal spray Place 1 spray into the nose daily as needed for allergies.      folic acid (FOLVITE) 1 MG tablet Take 1 mg by mouth daily.     furosemide (LASIX) 20 MG tablet TAKE 1 TABLET BY MOUTH  DAILY 90 tablet 3   glucose blood (ACCU-CHEK AVIVA PLUS) test strip USE TO TEST BLOOD SUGAR 6  TIMES PER DAY; E11.9 600 each 11   HUMALOG 100 UNIT/ML  injection INJECT SUBCUTANEOUSLY VIA  INSULIN PUMP TOTAL OF 120  UNITS PER DAY 110 mL 3   Insulin Infusion Pump (T: SLIM X2 INS PMP/CONTROL 7.4) DEVI by Does not apply route.  Insulin Infusion Pump Supplies (AUTOSOFT XC INFUSION SET) MISC Inject 1 Act into the skin every other day. Use with 49m cannula and 23 inch tubing. *5 boxes (90 day supply) 5 each 3   Insulin Infusion Pump Supplies (T:SLIM INSULIN CARTRIDGE 3ML) MISC Use with insulin pump, fill once every 2 days. 5 each 3   losartan (COZAAR) 25 MG tablet Take 12.5 mg by mouth daily.     Multiple Vitamins-Minerals (CENTRUM SILVER PO) Take 1 tablet by mouth daily.     Omega-3 Fatty Acids (FISH OIL) 1200 MG CAPS Take 1,200 mg by mouth daily.     omeprazole (PRILOSEC) 20 MG capsule Take 1 capsule (20 mg total) by mouth daily as needed. 90 capsule 2   rosuvastatin (CRESTOR) 40 MG tablet TAKE 1 TABLET BY MOUTH AT  BEDTIME 90 tablet 3   vitamin B-12 (CYANOCOBALAMIN) 1000 MCG tablet Take 1,000 mcg by mouth daily.     No facility-administered medications prior to visit.    Allergies  Allergen Reactions   Lipitor [Atorvastatin] Other (See Comments)    unknown   Niacin     REACTION: Severe heartburn   Pioglitazone     REACTION: Edema    ROS Review of Systems  Constitutional:  Negative for chills, diaphoresis, fatigue, fever and unexpected weight change.  HENT: Negative.    Eyes:  Negative for photophobia and visual disturbance.  Respiratory: Negative.    Cardiovascular: Negative.   Gastrointestinal: Negative.   Genitourinary:  Negative for difficulty urinating, frequency and urgency.  Musculoskeletal:  Positive for arthralgias, back pain and gait problem.  Neurological:  Negative for speech difficulty and weakness.  Psychiatric/Behavioral: Negative.       Objective:    Physical Exam Vitals and nursing note reviewed.  Constitutional:      General: He is not in acute distress.    Appearance: Normal appearance. He is not  ill-appearing, toxic-appearing or diaphoretic.  HENT:     Head: Normocephalic and atraumatic.     Right Ear: Tympanic membrane, ear canal and external ear normal.     Left Ear: Tympanic membrane, ear canal and external ear normal.     Mouth/Throat:     Mouth: Mucous membranes are moist.     Pharynx: Oropharynx is clear. No oropharyngeal exudate or posterior oropharyngeal erythema.  Eyes:     General: No scleral icterus.       Right eye: No discharge.        Left eye: No discharge.     Extraocular Movements: Extraocular movements intact.     Conjunctiva/sclera: Conjunctivae normal.     Pupils: Pupils are equal, round, and reactive to light.  Neck:     Vascular: No carotid bruit.  Cardiovascular:     Rate and Rhythm: Normal rate and regular rhythm.  Pulmonary:     Effort: Pulmonary effort is normal.     Breath sounds: Normal breath sounds.  Abdominal:     General: Bowel sounds are normal.  Musculoskeletal:     Cervical back: No rigidity or tenderness.     Right lower leg: No edema.     Left lower leg: No edema.  Lymphadenopathy:     Cervical: No cervical adenopathy.  Neurological:     Mental Status: He is alert and oriented to person, place, and time.  Psychiatric:        Mood and Affect: Mood normal.        Behavior: Behavior normal.    BP 124/70 (BP  Location: Right Arm, Patient Position: Sitting, Cuff Size: Large)   Pulse 88   Temp 98 F (36.7 C) (Temporal)   Ht '5\' 11"'  (1.803 m)   Wt 265 lb 12.8 oz (120.6 kg)   SpO2 96%   BMI 37.07 kg/m  Wt Readings from Last 3 Encounters:  08/24/21 265 lb 12.8 oz (120.6 kg)  08/22/21 266 lb 12.8 oz (121 kg)  07/25/21 262 lb (118.8 kg)     Health Maintenance Due  Topic Date Due   Hepatitis C Screening  Never done   Zoster Vaccines- Shingrix (1 of 2) Never done   TETANUS/TDAP  10/27/2018   OPHTHALMOLOGY EXAM  08/20/2021    There are no preventive care reminders to display for this patient.  Lab Results  Component Value  Date   TSH 2.98 09/21/2020   Lab Results  Component Value Date   WBC 4.2 05/19/2021   HGB 14.1 05/19/2021   HCT 42.1 05/19/2021   MCV 90.9 05/19/2021   PLT 117.0 (L) 05/19/2021   Lab Results  Component Value Date   NA 140 05/19/2021   K 3.9 05/19/2021   CO2 29 05/19/2021   GLUCOSE 269 (H) 05/19/2021   BUN 18 05/19/2021   CREATININE 1.55 (H) 05/19/2021   BILITOT 0.7 02/28/2021   ALKPHOS 82 02/28/2021   AST 18 02/28/2021   ALT 20 02/28/2021   PROT 6.6 02/28/2021   ALBUMIN 4.0 02/28/2021   CALCIUM 9.3 05/19/2021   ANIONGAP 9 04/27/2021   GFR 42.29 (L) 05/19/2021   Lab Results  Component Value Date   CHOL 135 02/28/2021   Lab Results  Component Value Date   HDL 33.60 (L) 02/28/2021   Lab Results  Component Value Date   LDLCALC 66 02/28/2021   Lab Results  Component Value Date   TRIG 174.0 (H) 02/28/2021   Lab Results  Component Value Date   CHOLHDL 4 02/28/2021   Lab Results  Component Value Date   HGBA1C 6.6 (A) 08/22/2021      Assessment & Plan:   Problem List Items Addressed This Visit       Genitourinary   Stage 3a chronic kidney disease ([place])   Relevant Orders   Basic metabolic panel   CBC   Microalbumin / creatinine urine ratio     Other   Vitamin D deficiency - Primary   Relevant Orders   VITAMIN D 25 Hydroxy (Vit-D Deficiency, Fractures)   Elevated LDL cholesterol level   Relevant Orders   LDL cholesterol, direct   B12 deficiency   Relevant Orders   CBC   Vitamin B12   History of gout   Relevant Orders   Uric acid   Dehydration   Relevant Orders   Urinalysis, Routine w reflex microscopic    No orders of the defined types were placed in this encounter.   Follow-up: Return in about 6 months (around 02/21/2022).    Libby Maw, MD

## 2021-09-01 ENCOUNTER — Encounter: Payer: Medicare Other | Attending: Physical Medicine & Rehabilitation | Admitting: Physical Medicine and Rehabilitation

## 2021-09-01 ENCOUNTER — Encounter: Payer: Self-pay | Admitting: Physical Medicine and Rehabilitation

## 2021-09-01 ENCOUNTER — Other Ambulatory Visit: Payer: Self-pay

## 2021-09-01 VITALS — BP 121/74 | HR 89 | Temp 98.2°F | Ht 71.0 in | Wt 266.4 lb

## 2021-09-01 DIAGNOSIS — M47819 Spondylosis without myelopathy or radiculopathy, site unspecified: Secondary | ICD-10-CM | POA: Diagnosis not present

## 2021-09-01 MED ORDER — AMITRIPTYLINE HCL 50 MG PO TABS: 25.0000 mg | ORAL_TABLET | Freq: Every day | ORAL | 3 refills | Status: DC

## 2021-09-01 MED ORDER — DULOXETINE HCL 60 MG PO CPEP
60.0000 mg | ORAL_CAPSULE | Freq: Every day | ORAL | 3 refills | Status: DC
Start: 2021-09-01 — End: 2023-04-19

## 2021-09-01 MED ORDER — DICLOFENAC SODIUM 1 % EX GEL: 2.0000 g | Freq: Four times a day (QID) | CUTANEOUS | 3 refills | Status: DC

## 2021-09-01 NOTE — Progress Notes (Addendum)
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     Informed consent was obtained after discussing risks and benefits of the procedure with patient these include bleeding, bruising, and infection.  The patient elects to proceed and has given written consent.   The patient was brought into the procedure room and placed in the prone position.    The left L5 paraspinal area was cleaned and prepped with povidone-iodine with a wide margin and allowed to dry.  Sterile drape was placed with fenestration over the target site.  The peripheral neurostimulator was placed outside of the sterile field over clean skin on upper back of the affected side.    The multifidus muscle was visualized using ultrasound.   The skin overlying the lead site was anesthetized with 4cc of 1% Lidocaine. Care was taken to assure that local anesthetic was not administered close to the target neurostimulator lead site to prevent an altered response to stimulation.    Test stimulation was delivered via a 17 gauge percutaneous sleeve and 19 gauge stimulating probe inside the sleeve to assist in identifying the optimal lead location.  The right lower lumbar multifidus muscle was targeted.   Once this point was located, the stimulating probe was inserted, using ultrasound guidance, at the marked point above using sterile technique.  The probe was advanced until bone was felt and then slightly retracted.      A provided test cable was connected to the probe and the intensity was adjusted until comfortable muscle contraction of the multifidus produced by the neurostimulator was detected by the patient. Once optimal location was identified, stimulation was turned off, the test cable was disconnected from the stimulating probe, and the stimulating probe was removed from the sleeve.   The 20 gauge Microlead Introducer containing the lead was inserted into the  percutaneous sleeve and advanced to the established target depth. The connector box was connected to the de-inuslated end of the lead and the cable was connected to the Stimulator. Optimized stimulation parameters were tested again beginning at slightly lower intensity. Intensity was adjusted until the desired response was obtained, while visualizing multifidus contractions. The location and depth of the electrode was noted. Stimulation was turned off. The connector box was  disconnected from the microlead, and the introducer and sleeve were then withdrawn while manual pressure was applied at the exit site at which point the lead was deployed, secured and implanted. A drop of dermabond was placed at the electrode exit site.   The connector box was reconnected and stimulation was again delivered to confirm that the lead did not move upon removal of the introducer. The lead position was confirmed by the presence of comfortable sensations in the painful areas. The lead was coiled to allow for strain relief then threaded through and secured into the connector box and secured in a cradle.  Excess lead was trimmed to length. The cable was inserted into the neurostimulator and the neurostimulator was positioned on the anterior thigh below the lead exit site at which point the final stimulation response again confirmed optimal lead placement.    The area, including the lead, connecting box and cable, was dressed with occlusive dressing and the patient and caregiver were instructed on the proper management of the site and how to operate the External Pulse Generator (EPG) and the hand held patient programmer.    Follow up scheduled

## 2021-09-10 ENCOUNTER — Other Ambulatory Visit: Payer: Self-pay | Admitting: Family Medicine

## 2021-09-10 DIAGNOSIS — N401 Enlarged prostate with lower urinary tract symptoms: Secondary | ICD-10-CM

## 2021-09-20 ENCOUNTER — Other Ambulatory Visit: Payer: Self-pay

## 2021-09-20 ENCOUNTER — Encounter: Payer: Medicare Other | Admitting: Physical Medicine and Rehabilitation

## 2021-09-20 ENCOUNTER — Encounter: Payer: Self-pay | Admitting: Physical Medicine and Rehabilitation

## 2021-09-20 VITALS — BP 171/88 | HR 86 | Temp 97.9°F | Ht 71.0 in | Wt 269.0 lb

## 2021-09-20 DIAGNOSIS — M47819 Spondylosis without myelopathy or radiculopathy, site unspecified: Secondary | ICD-10-CM | POA: Diagnosis not present

## 2021-09-20 NOTE — Progress Notes (Signed)
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     Informed consent was obtained after discussing risks and benefits of the procedure with patient these include bleeding, bruising, and infection.  The patient elects to proceed and has given written consent.   The patient was brought into the procedure room and placed in the prone position.    The right L5 paraspinal area was cleaned and prepped with povidone-iodine with a wide margin and allowed to dry.  Sterile drape was placed with fenestration over the target site.  The peripheral neurostimulator was placed outside of the sterile field over clean skin on upper back of the affected side.    The multifidus muscle was visualized using ultrasound.   The skin overlying the lead site was anesthetized with 4cc of 1% Lidocaine. Care was taken to assure that local anesthetic was not administered close to the target neurostimulator lead site to prevent an altered response to stimulation.    Test stimulation was delivered via a 17 gauge percutaneous sleeve and 19 gauge stimulating probe inside the sleeve to assist in identifying the optimal lead location.  The right lower lumbar multifidus muscle was targeted.   Once this point was located, the stimulating probe was inserted, using ultrasound guidance, at the marked point above using sterile technique.  The probe was advanced until bone was felt and then slightly retracted.      A provided test cable was connected to the probe and the intensity was adjusted until comfortable muscle contraction of the multifidus produced by the neurostimulator was detected by the patient. Once optimal location was identified, stimulation was turned off, the test cable was disconnected from the stimulating probe, and the stimulating probe was removed from the sleeve.   The 20 gauge Microlead Introducer containing the lead was inserted into the  percutaneous sleeve and advanced to the established target depth. The connector box was connected to the de-inuslated end of the lead and the cable was connected to the Stimulator. Optimized stimulation parameters were tested again beginning at slightly lower intensity. Intensity was adjusted until the desired response was obtained, while visualizing multifidus contractions. The location and depth of the electrode was noted. Stimulation was turned off. The connector box was  disconnected from the microlead, and the introducer and sleeve were then withdrawn while manual pressure was applied at the exit site at which point the lead was deployed, secured and implanted. A drop of dermabond was placed at the electrode exit site.   The connector box was reconnected and stimulation was again delivered to confirm that the lead did not move upon removal of the introducer. The lead position was confirmed by the presence of comfortable sensations in the painful areas. The lead was coiled to allow for strain relief then threaded through and secured into the connector box and secured in a cradle.  Excess lead was trimmed to length. The cable was inserted into the neurostimulator and the neurostimulator was positioned on the anterior thigh below the lead exit site at which point the final stimulation response again confirmed optimal lead placement.    The area, including the lead, connecting box and cable, was dressed with occlusive dressing and the patient and caregiver were instructed on the proper management of the site and how to operate the External Pulse Generator (EPG) and the hand held patient programmer.    Follow up scheduled

## 2021-10-30 ENCOUNTER — Emergency Department (HOSPITAL_COMMUNITY)
Admission: EM | Admit: 2021-10-30 | Discharge: 2021-10-30 | Disposition: A | Discharge status: Nursing Home (Includes ICF) | Payer: Medicare Other | Attending: Student | Admitting: Student

## 2021-10-30 ENCOUNTER — Emergency Department (HOSPITAL_COMMUNITY): Payer: Medicare Other

## 2021-10-30 DIAGNOSIS — S0531XA Ocular laceration without prolapse or loss of intraocular tissue, right eye, initial encounter: Secondary | ICD-10-CM

## 2021-10-30 DIAGNOSIS — Z79899 Other long term (current) drug therapy: Secondary | ICD-10-CM | POA: Diagnosis not present

## 2021-10-30 DIAGNOSIS — Z794 Long term (current) use of insulin: Secondary | ICD-10-CM | POA: Insufficient documentation

## 2021-10-30 DIAGNOSIS — E119 Type 2 diabetes mellitus without complications: Secondary | ICD-10-CM | POA: Insufficient documentation

## 2021-10-30 DIAGNOSIS — S01121A Laceration with foreign body of right eyelid and periocular area, initial encounter: Secondary | ICD-10-CM | POA: Insufficient documentation

## 2021-10-30 DIAGNOSIS — Y9384 Activity, sleeping: Secondary | ICD-10-CM | POA: Diagnosis not present

## 2021-10-30 DIAGNOSIS — S0591XA Unspecified injury of right eye and orbit, initial encounter: Secondary | ICD-10-CM | POA: Diagnosis present

## 2021-10-30 DIAGNOSIS — S0990XA Unspecified injury of head, initial encounter: Secondary | ICD-10-CM | POA: Insufficient documentation

## 2021-10-30 DIAGNOSIS — I2510 Atherosclerotic heart disease of native coronary artery without angina pectoris: Secondary | ICD-10-CM | POA: Diagnosis not present

## 2021-10-30 DIAGNOSIS — W06XXXA Fall from bed, initial encounter: Secondary | ICD-10-CM | POA: Diagnosis not present

## 2021-10-30 DIAGNOSIS — I10 Essential (primary) hypertension: Secondary | ICD-10-CM | POA: Insufficient documentation

## 2021-10-30 DIAGNOSIS — S058X1A Other injuries of right eye and orbit, initial encounter: Secondary | ICD-10-CM | POA: Diagnosis not present

## 2021-10-30 DIAGNOSIS — H2101 Hyphema, right eye: Secondary | ICD-10-CM | POA: Diagnosis not present

## 2021-10-30 DIAGNOSIS — H1131 Conjunctival hemorrhage, right eye: Secondary | ICD-10-CM | POA: Diagnosis not present

## 2021-10-30 MED ORDER — FENTANYL CITRATE PF 50 MCG/ML IJ SOSY
50.0000 ug | PREFILLED_SYRINGE | Freq: Once | INTRAMUSCULAR | Status: AC
  Administered 2021-10-30: 50 ug via INTRAVENOUS
  Filled 2021-10-30: qty 1

## 2021-10-30 MED ORDER — LIDOCAINE HCL 2 % IJ SOLN
INTRAMUSCULAR | Status: AC
  Filled 2021-10-30: qty 20

## 2021-10-30 MED ORDER — TETRACAINE HCL 0.5 % OP SOLN
1.0000 [drp] | Freq: Once | OPHTHALMIC | Status: AC
Start: 2021-10-30 — End: 2021-10-30
  Administered 2021-10-30: 1 [drp] via OPHTHALMIC
  Filled 2021-10-30: qty 4

## 2021-10-30 MED ORDER — LIDOCAINE HCL (PF) 1 % IJ SOLN: 5.0000 mL | Freq: Once | INTRAMUSCULAR | Status: DC

## 2021-10-30 MED ORDER — ONDANSETRON HCL 4 MG/2ML IJ SOLN
4.0000 mg | Freq: Once | INTRAMUSCULAR | Status: AC
  Administered 2021-10-30: 4 mg via INTRAVENOUS
  Filled 2021-10-30: qty 2

## 2021-10-30 MED ORDER — DIPHENHYDRAMINE HCL 50 MG/ML IJ SOLN
12.5000 mg | Freq: Once | INTRAMUSCULAR | Status: AC
  Administered 2021-10-30: 12.5 mg via INTRAVENOUS
  Filled 2021-10-30: qty 1

## 2021-10-30 MED ORDER — ONDANSETRON 4 MG PO TBDP
4.0000 mg | ORAL_TABLET | Freq: Once | ORAL | Status: AC
  Administered 2021-10-30: 4 mg via ORAL
  Filled 2021-10-30 (×2): qty 1

## 2021-10-30 MED ORDER — SODIUM CHLORIDE 0.9 % IV SOLN
2.0000 g | Freq: Once | INTRAVENOUS | Status: AC
  Administered 2021-10-30: 2 g via INTRAVENOUS
  Filled 2021-10-30: qty 2

## 2021-10-30 MED ORDER — VANCOMYCIN HCL 500 MG/100ML IV SOLN
500.0000 mg | Freq: Once | INTRAVENOUS | Status: DC
  Filled 2021-10-30: qty 100

## 2021-10-30 MED ORDER — LIDOCAINE-EPINEPHRINE 2 %-1:100000 IJ SOLN
INTRAMUSCULAR | Status: AC
  Filled 2021-10-30: qty 1

## 2021-10-30 MED ORDER — PROCHLORPERAZINE EDISYLATE 10 MG/2ML IJ SOLN
10.0000 mg | Freq: Once | INTRAMUSCULAR | Status: AC
  Administered 2021-10-30: 10 mg via INTRAVENOUS
  Filled 2021-10-30: qty 2

## 2021-10-30 MED ORDER — VANCOMYCIN HCL 2000 MG/400ML IV SOLN
2000.0000 mg | Freq: Once | INTRAVENOUS | Status: AC
  Administered 2021-10-30: 2000 mg via INTRAVENOUS
  Filled 2021-10-30: qty 400

## 2021-10-30 MED ORDER — FLUORESCEIN SODIUM 1 MG OP STRP
1.0000 | ORAL_STRIP | Freq: Once | OPHTHALMIC | Status: AC
  Administered 2021-10-30: 1 via OPHTHALMIC
  Filled 2021-10-30: qty 1

## 2021-10-30 NOTE — Consult Note (Signed)
OPHTHALMOLOGY CONSULT NOTE   HPI: 81 yo WM presents to ED following trauma to right eye. Pt fell out of bed and right eye struck corner of bedside table. Pt reports decreased vision. CT shows open globe injury OD.  OHx: pseudophakia OU  ORx: none   No current facility-administered medications on file prior to encounter.   Current Outpatient Medications on File Prior to Encounter  Medication Sig Dispense Refill   Accu-Chek FastClix Lancets MISC USE TO CHECK BLOOD SUGAR UP TO 6 TIMES DAILY 612 each 3   alfuzosin (UROXATRAL) 10 MG 24 hr tablet TAKE 1 TABLET BY MOUTH  DAILY WITH BREAKFAST 90 tablet 3   allopurinol (ZYLOPRIM) 300 MG tablet TAKE 1 TABLET BY MOUTH  DAILY 90 tablet 3   amitriptyline (ELAVIL) 50 MG tablet Take 0.5 tablets (25 mg total) by mouth at bedtime. 45 tablet 3   Blood Glucose Monitoring Suppl (ACCU-CHEK AVIVA PLUS) w/Device KIT Use as directed 1 kit 0   cetirizine (ZYRTEC) 10 MG tablet Take 10 mg by mouth daily as needed for allergies.     Continuous Blood Gluc Sensor (DEXCOM G6 SENSOR) MISC Inject 1 Device into the skin as directed. Apply to skin SQ every 10 days 9 each 3   diclofenac Sodium (VOLTAREN) 1 % GEL Apply 2 g topically 4 (four) times daily. 150 g 3   Dulaglutide (TRULICITY) 4.5 PI/9.5JO SOPN Inject 4.5 mg as directed once a week. 6 mL 3   DULoxetine (CYMBALTA) 60 MG capsule Take 1 capsule (60 mg total) by mouth daily. 90 capsule 3   fluticasone (FLONASE) 50 MCG/ACT nasal spray Place 1 spray into the nose daily as needed for allergies.      folic acid (FOLVITE) 1 MG tablet Take 1 mg by mouth daily.     furosemide (LASIX) 20 MG tablet TAKE 1 TABLET BY MOUTH  DAILY 90 tablet 3   glucose blood (ACCU-CHEK AVIVA PLUS) test strip USE TO TEST BLOOD SUGAR 6  TIMES PER DAY; E11.9 600 each 11   HUMALOG 100 UNIT/ML injection INJECT SUBCUTANEOUSLY VIA  INSULIN PUMP TOTAL OF 120  UNITS PER DAY 110 mL 3   Insulin Infusion Pump (T: SLIM X2 INS PMP/CONTROL 7.4) DEVI by Does  not apply route.     Insulin Infusion Pump Supplies (AUTOSOFT XC INFUSION SET) MISC Inject 1 Act into the skin every other day. Use with 34m cannula and 23 inch tubing. *5 boxes (90 day supply) 5 each 3   Insulin Infusion Pump Supplies (T:SLIM INSULIN CARTRIDGE 3ML) MISC Use with insulin pump, fill once every 2 days. 5 each 3   losartan (COZAAR) 25 MG tablet Take 12.5 mg by mouth daily.     Multiple Vitamins-Minerals (CENTRUM SILVER PO) Take 1 tablet by mouth daily.     Omega-3 Fatty Acids (FISH OIL) 1200 MG CAPS Take 1,200 mg by mouth daily.     omeprazole (PRILOSEC) 20 MG capsule Take 1 capsule (20 mg total) by mouth daily as needed. 90 capsule 2   rosuvastatin (CRESTOR) 40 MG tablet TAKE 1 TABLET BY MOUTH AT  BEDTIME 90 tablet 3   vitamin B-12 (CYANOCOBALAMIN) 1000 MCG tablet Take 1,000 mcg by mouth daily.      Past Medical History:  Diagnosis Date   Allergy    Anal fissure    Anemia, unspecified    Arthritis    Spinal OA   BACK PAIN, LUMBAR 10/23/2007   CARDIAC MURMUR, SYSTOLIC 18/01/1659  DIABETES MELLITUS, TYPE  I 11/24/5425   DIASTOLIC DYSFUNCTION 0/62/3762   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[facility])    Duodenitis    Edema 05/12/2008   Esophageal stricture    GERD (gastroesophageal reflux disease)    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   Gynecomastia    Hepatic steatosis    HYPERLIPIDEMIA 05/20/2007   Hypertension    Hypogonadism male    Morbid obesity ([place]) 09/10/2009   OBSTRUCTIVE SLEEP APNEA 11/04/2007   Personal history of colonic polyps    RENAL INSUFFICIENCY 05/12/2008   Sleep apnea    uses cpap   Squamous cell carcinoma of skin 02/16/2020   left lower leg, anterior-cx3,30f   Urolithiasis     family history includes Colon cancer (age of onset: 556 in his brother; Colon polyps in his brother; Healthy in his daughter and son; Lung cancer in his brother; Other in his mother.  Social History   Occupational History    Employer: [employer]  Tobacco Use   Smoking status:  Former    Packs/day: 2.00    Years: 31.00    Pack years: 62.00    Types: Cigarettes    Start date: 1955    Quit date: 10/23/1982    Years since quitting: 39.0   Smokeless tobacco: Never  Vaping Use   Vaping Use: Never used  Substance and Sexual Activity   Alcohol use: No    Alcohol/week: 0.0 standard drinks   Drug use: No   Sexual activity: Not on file    Allergies  Allergen Reactions   Lipitor [Atorvastatin] Other (See Comments)    unknown   Niacin     REACTION: Severe heartburn   Pioglitazone     REACTION: Edema    EXAM  Mental Status: A&Ox3   Base Exam  OD  OS   VA (near card)  LP   Pupils  Round; reactive; 4-293m no rAPD Round; reactive; 4-62m75mIOP     Motility  Full Full  External  Closed forehead laceration Normal    Anterior Exam  OD  OS   Lids / Lashes 4+ hematoma, edema upper lid   Conj / Sclera 360 subconj heme; prolapsed uvea temporal   Cornea  Clear   Ant Chamber  Subtotal hyphema   Iris    Lens No view    MAXILLOFACIAL CT (01.08.23)   1. Ruptured right globe, globe containing both air and hemorrhage. 2. Right periorbital and forehead soft tissue hemorrhage. No radiopaque foreign body. 3. No fractures.  Assessment/Plan:  1. Open globe injury OD  - mechanism of injury - fell out of bed and struck right eye on bedside table  - 360 subconj heme + uveal prolapse temporal quad  - subtotal hyphema  - CT shows globe rupture OD with air and hemorrhage in globe  - recommend transfer to tertiary facility for likely globe exploration and management  - prophylactic antibiotics administered in ED and eye shield over OD   BriGardiner Sleeper.D., Ph.D. Diseases & Surgery of the Retina and [facility]

## 2021-10-30 NOTE — ED Triage Notes (Signed)
BIBA from home. Patient fell out of bed while sleeping, hitting head on corner of nightstand. 2" lac above R eye. No LOC. Patient endorses pain in neck, C Collar in place. AOX4  BP: 180/100 CBG: 124 HR: 80

## 2021-10-30 NOTE — ED Provider Notes (Signed)
[facility] DEPT Provider Note   CSN: [id number] Arrival date & time: 10/30/21  1448     History  Chief Complaint  Patient presents with   Fall   Laceration    [name] is a 81 y.o. male with PMH HTN, obesity, ascending aortic aneurysm, diabetes, OSA, CAD, previous right-sided cataract surgery who presents emergency department for evaluation of a fall and a head injury as well as an eye injury.  Patient states that this morning he rolled out of bed and struck his eye and face against the corner of a bedside table.  He arrives with copious bleeding from a laceration above his eyebrow and complaints of loss of vision in the right eye.  He states that he can only see light perception but cannot make out shapes.  Denies chest pain, shortness of breath, abdominal pain,  or other systemic symptoms.  No additional external evidence of trauma.  Patient does endorse nausea.   Fall  Laceration     Home Medications Prior to Admission medications   Medication Sig Start Date End Date Taking? Authorizing Provider  Accu-Chek FastClix Lancets MISC USE TO CHECK BLOOD SUGAR UP TO 6 TIMES DAILY 02/22/19   Renato Shin, MD  alfuzosin (UROXATRAL) 10 MG 24 hr tablet TAKE 1 TABLET BY MOUTH  DAILY WITH BREAKFAST 09/12/21   Libby Maw, MD  allopurinol (ZYLOPRIM) 300 MG tablet TAKE 1 TABLET BY MOUTH  DAILY 05/23/21   Libby Maw, MD  amitriptyline (ELAVIL) 50 MG tablet Take 0.5 tablets (25 mg total) by mouth at bedtime. 09/01/21   Raulkar, Clide Deutscher, MD  Blood Glucose Monitoring Suppl (ACCU-CHEK AVIVA PLUS) w/Device KIT Use as directed 04/21/16   Renato Shin, MD  cetirizine (ZYRTEC) 10 MG tablet Take 10 mg by mouth daily as needed for allergies.    [provider]  Continuous Blood Gluc Sensor (DEXCOM G6 SENSOR) MISC Inject 1 Device into the skin as directed. Apply to skin SQ every 10 days 04/02/19   Renato Shin, MD  diclofenac Sodium (VOLTAREN) 1  % GEL Apply 2 g topically 4 (four) times daily. 09/01/21   Raulkar, Clide Deutscher, MD  Dulaglutide (TRULICITY) 4.5 JE/5.6DJ SOPN Inject 4.5 mg as directed once a week. 08/22/21   Renato Shin, MD  DULoxetine (CYMBALTA) 60 MG capsule Take 1 capsule (60 mg total) by mouth daily. 09/01/21   Raulkar, Clide Deutscher, MD  fluticasone (FLONASE) 50 MCG/ACT nasal spray Place 1 spray into the nose daily as needed for allergies.     [provider]  folic acid (FOLVITE) 1 MG tablet Take 1 mg by mouth daily.    [provider]  furosemide (LASIX) 20 MG tablet TAKE 1 TABLET BY MOUTH  DAILY 05/23/21   Libby Maw, MD  glucose blood (ACCU-CHEK AVIVA PLUS) test strip USE TO TEST BLOOD SUGAR 6  TIMES PER DAY; E11.9 10/04/18   Renato Shin, MD  HUMALOG 100 UNIT/ML injection INJECT SUBCUTANEOUSLY VIA  INSULIN PUMP TOTAL OF 120  UNITS PER DAY 05/15/21   Renato Shin, MD  Insulin Infusion Pump (T: SLIM X2 INS PMP/CONTROL 7.4) DEVI by Does not apply route.    [provider]  Insulin Infusion Pump Supplies (AUTOSOFT XC INFUSION SET) MISC Inject 1 Act into the skin every other day. Use with 68m cannula and 23 inch tubing. *5 boxes (90 day supply) 04/02/19   ERenato Shin MD  Insulin Infusion Pump Supplies (T:SLIM INSULIN CARTRIDGE 3ML) MISC Use  with insulin pump, fill once every 2 days. 04/02/19   Renato Shin, MD  losartan (COZAAR) 25 MG tablet Take 12.5 mg by mouth daily. 05/03/21   [provider]  Multiple Vitamins-Minerals (CENTRUM SILVER PO) Take 1 tablet by mouth daily.    [provider]  Omega-3 Fatty Acids (FISH OIL) 1200 MG CAPS Take 1,200 mg by mouth daily.    [provider]  omeprazole (PRILOSEC) 20 MG capsule Take 1 capsule (20 mg total) by mouth daily as needed. 02/03/19   Libby Maw, MD  rosuvastatin (CRESTOR) 40 MG tablet TAKE 1 TABLET BY MOUTH AT  BEDTIME 12/28/20   Libby Maw, MD  vitamin B-12 (CYANOCOBALAMIN) 1000 MCG tablet  Take 1,000 mcg by mouth daily.    [provider]      Allergies    Lipitor [atorvastatin], Niacin, and Pioglitazone    Review of Systems   Review of Systems  Eyes:  Positive for visual disturbance.  Gastrointestinal:  Positive for nausea.  Skin:  Positive for wound.   Physical Exam Updated Vital Signs BP (!) 181/97 (BP Location: Right Arm)    Pulse 92    Resp 18    SpO2 96%  Physical Exam Vitals and nursing note reviewed.  Constitutional:      General: He is not in acute distress.    Appearance: He is well-developed.  HENT:     Head: Normocephalic.     Comments: 5 cm laceration over the eyebrow on the right Eyes:     Comments: Left conjunctive a unremarkable, right eye with scleral hematoma and possible scleral laceration, total anterior hyphema  Cardiovascular:     Rate and Rhythm: Normal rate and regular rhythm.     Heart sounds: No murmur heard. Pulmonary:     Effort: Pulmonary effort is normal. No respiratory distress.     Breath sounds: Normal breath sounds.  Abdominal:     Palpations: Abdomen is soft.     Tenderness: There is no abdominal tenderness.  Musculoskeletal:        General: No swelling.     Cervical back: Neck supple.  Skin:    General: Skin is warm and dry.     Capillary Refill: Capillary refill takes less than 2 seconds.  Neurological:     Mental Status: He is alert.  Psychiatric:        Mood and Affect: Mood normal.    ED Results / Procedures / Treatments   Labs (all labs ordered are listed, but only abnormal results are displayed) Labs Reviewed - No data to display  EKG None  Radiology CT Head Wo Contrast  Result Date: 10/30/2021 CLINICAL DATA:  Patient fell out of bed and hit the right side of his head the night stand. Right eye hematoma. Laceration. EXAM: CT HEAD WITHOUT CONTRAST CT MAXILLOFACIAL WITHOUT CONTRAST CT CERVICAL SPINE WITHOUT CONTRAST TECHNIQUE: Multidetector CT imaging of the head, cervical spine, and maxillofacial  structures were performed using the standard protocol without intravenous contrast. Multiplanar CT image reconstructions of the cervical spine and maxillofacial structures were also generated. COMPARISON:  None. FINDINGS: CT HEAD FINDINGS Brain: No evidence of acute infarction, hemorrhage, hydrocephalus, extra-axial collection or mass lesion/mass effect. Vascular: No hyperdense vessel or unexpected calcification. Skull: Normal. Negative for fracture or focal lesion. Other: Right frontal/periorbital hematoma described further under the maxillofacial CT. CT MAXILLOFACIAL FINDINGS Osseous: No fracture or mandibular dislocation. No destructive process. Orbits: Right globe demonstrates air and increased attenuation consistent with  hemorrhage, findings consistent with globe rupture. Overlying right periorbital preseptal hematoma extending to the inferior forehead. Postseptal orbit with no mass, hemorrhage or inflammation. No radiopaque foreign body. Left globe and orbit are unremarkable. Sinuses: Clear. Soft tissues: No other soft tissue abnormality. CT CERVICAL SPINE FINDINGS Alignment: Straightened cervical lordosis.  No spondylolisthesis. Skull base and vertebrae: No acute fracture. No primary bone lesion or focal pathologic process. Soft tissues and spinal canal: No prevertebral fluid or swelling. No visible canal hematoma. Disc levels: Moderate loss of disc height with mild endplate sclerosis, disc bulging and endplate spurring at Z6-X0, C5-C6 and C6-C7. No convincing disc herniation. Facet degenerative changes most evident on the left at C2-C3 and C3-C4. Upper chest: Negative. Other: None. IMPRESSION: HEAD CT 1. No acute intracranial abnormalities. MAXILLOFACIAL CT 1. Ruptured right globe, globe containing both air and hemorrhage. 2. Right periorbital and forehead soft tissue hemorrhage. No radiopaque foreign body. 3. No fractures. CERVICAL CT 1. No fracture or acute finding. Electronically Signed   By: Lajean Manes M.D.   On: 10/30/2021 09:21   CT Cervical Spine Wo Contrast  Result Date: 10/30/2021 CLINICAL DATA:  Patient fell out of bed and hit the right side of his head the night stand. Right eye hematoma. Laceration. EXAM: CT HEAD WITHOUT CONTRAST CT MAXILLOFACIAL WITHOUT CONTRAST CT CERVICAL SPINE WITHOUT CONTRAST TECHNIQUE: Multidetector CT imaging of the head, cervical spine, and maxillofacial structures were performed using the standard protocol without intravenous contrast. Multiplanar CT image reconstructions of the cervical spine and maxillofacial structures were also generated. COMPARISON:  None. FINDINGS: CT HEAD FINDINGS Brain: No evidence of acute infarction, hemorrhage, hydrocephalus, extra-axial collection or mass lesion/mass effect. Vascular: No hyperdense vessel or unexpected calcification. Skull: Normal. Negative for fracture or focal lesion. Other: Right frontal/periorbital hematoma described further under the maxillofacial CT. CT MAXILLOFACIAL FINDINGS Osseous: No fracture or mandibular dislocation. No destructive process. Orbits: Right globe demonstrates air and increased attenuation consistent with hemorrhage, findings consistent with globe rupture. Overlying right periorbital preseptal hematoma extending to the inferior forehead. Postseptal orbit with no mass, hemorrhage or inflammation. No radiopaque foreign body. Left globe and orbit are unremarkable. Sinuses: Clear. Soft tissues: No other soft tissue abnormality. CT CERVICAL SPINE FINDINGS Alignment: Straightened cervical lordosis.  No spondylolisthesis. Skull base and vertebrae: No acute fracture. No primary bone lesion or focal pathologic process. Soft tissues and spinal canal: No prevertebral fluid or swelling. No visible canal hematoma. Disc levels: Moderate loss of disc height with mild endplate sclerosis, disc bulging and endplate spurring at R6-E4, C5-C6 and C6-C7. No convincing disc herniation. Facet degenerative changes most  evident on the left at C2-C3 and C3-C4. Upper chest: Negative. Other: None. IMPRESSION: HEAD CT 1. No acute intracranial abnormalities. MAXILLOFACIAL CT 1. Ruptured right globe, globe containing both air and hemorrhage. 2. Right periorbital and forehead soft tissue hemorrhage. No radiopaque foreign body. 3. No fractures. CERVICAL CT 1. No fracture or acute finding. Electronically Signed   By: Lajean Manes M.D.   On: 10/30/2021 09:21   CT Maxillofacial Wo Contrast  Result Date: 10/30/2021 CLINICAL DATA:  Patient fell out of bed and hit the right side of his head the night stand. Right eye hematoma. Laceration. EXAM: CT HEAD WITHOUT CONTRAST CT MAXILLOFACIAL WITHOUT CONTRAST CT CERVICAL SPINE WITHOUT CONTRAST TECHNIQUE: Multidetector CT imaging of the head, cervical spine, and maxillofacial structures were performed using the standard protocol without intravenous contrast. Multiplanar CT image reconstructions of the cervical spine and maxillofacial structures were also generated. COMPARISON:  None. FINDINGS: CT HEAD FINDINGS Brain: No evidence of acute infarction, hemorrhage, hydrocephalus, extra-axial collection or mass lesion/mass effect. Vascular: No hyperdense vessel or unexpected calcification. Skull: Normal. Negative for fracture or focal lesion. Other: Right frontal/periorbital hematoma described further under the maxillofacial CT. CT MAXILLOFACIAL FINDINGS Osseous: No fracture or mandibular dislocation. No destructive process. Orbits: Right globe demonstrates air and increased attenuation consistent with hemorrhage, findings consistent with globe rupture. Overlying right periorbital preseptal hematoma extending to the inferior forehead. Postseptal orbit with no mass, hemorrhage or inflammation. No radiopaque foreign body. Left globe and orbit are unremarkable. Sinuses: Clear. Soft tissues: No other soft tissue abnormality. CT CERVICAL SPINE FINDINGS Alignment: Straightened cervical lordosis.  No  spondylolisthesis. Skull base and vertebrae: No acute fracture. No primary bone lesion or focal pathologic process. Soft tissues and spinal canal: No prevertebral fluid or swelling. No visible canal hematoma. Disc levels: Moderate loss of disc height with mild endplate sclerosis, disc bulging and endplate spurring at G2-R4, C5-C6 and C6-C7. No convincing disc herniation. Facet degenerative changes most evident on the left at C2-C3 and C3-C4. Upper chest: Negative. Other: None. IMPRESSION: HEAD CT 1. No acute intracranial abnormalities. MAXILLOFACIAL CT 1. Ruptured right globe, globe containing both air and hemorrhage. 2. Right periorbital and forehead soft tissue hemorrhage. No radiopaque foreign body. 3. No fractures. CERVICAL CT 1. No fracture or acute finding. Electronically Signed   By: Lajean Manes M.D.   On: 10/30/2021 09:21    Procedures .Critical Care Performed by: Teressa Lower, MD Authorized by: Teressa Lower, MD   Critical care provider statement:    Critical care time (minutes):  30   Critical care was necessary to treat or prevent imminent or life-threatening deterioration of the following conditions: Open globe with multiple antibiotics used.   Critical care was time spent personally by me on the following activities:  Development of treatment plan with patient or surrogate, discussions with consultants, evaluation of patient's response to treatment, examination of patient, ordering and review of laboratory studies, ordering and review of radiographic studies, ordering and performing treatments and interventions, pulse oximetry, re-evaluation of patient's condition and review of old charts .Marland KitchenLaceration Repair  Date/Time: 10/30/2021 3:08 PM Performed by: Teressa Lower, MD Authorized by: Teressa Lower, MD   Anesthesia:    Anesthesia method:  Local infiltration   Local anesthetic:  Lidocaine 1% WITH epi Laceration details:    Location:  Face   Face location:  R eyebrow    Length (cm):  5 Pre-procedure details:    Preparation:  Patient was prepped and draped in usual sterile fashion Exploration:    Hemostasis achieved with:  Direct pressure   Imaging outcome: foreign body noted   Treatment:    Area cleansed with:  Saline   Amount of cleaning:  Extensive   Irrigation solution:  Sterile saline   Irrigation method:  Pressure wash Skin repair:    Repair method:  Sutures   Suture size:  5-0   Suture material:  Prolene   Suture technique:  Figure eight and simple interrupted Approximation:    Approximation:  Close Repair type:    Repair type:  Intermediate Post-procedure details:    Dressing:  Open (no dressing)   Procedure completion:  Tolerated well, no immediate complications .1-3 Lead EKG Interpretation Performed by: Teressa Lower, MD Authorized by: Teressa Lower, MD     Interpretation: normal     ECG rate assessment: normal     Rhythm: sinus rhythm     Ectopy: none  Conduction: normal      Medications Ordered in ED Medications  lidocaine (PF) (XYLOCAINE) 1 % injection 5 mL (has no administration in time range)  lidocaine (XYLOCAINE) 2 % (with pres) injection (has no administration in time range)  ceFEPIme (MAXIPIME) 2 g in sodium chloride 0.9 % 100 mL IVPB (2 g Intravenous New Bag/Given 10/30/21 0948)  vancomycin (VANCOREADY) IVPB 2000 mg/400 mL (has no administration in time range)    Followed by  vancomycin (VANCOREADY) IVPB 500 mg/100 mL (has no administration in time range)  prochlorperazine (COMPAZINE) injection 10 mg (has no administration in time range)  diphenhydrAMINE (BENADRYL) injection 12.5 mg (has no administration in time range)  fluorescein ophthalmic strip 1 strip (1 strip Right Eye Given 10/30/21 0948)  tetracaine (PONTOCAINE) 0.5 % ophthalmic solution 1 drop (1 drop Right Eye Given 10/30/21 0949)  ondansetron (ZOFRAN-ODT) disintegrating tablet 4 mg (4 mg Oral Given 10/30/21 0949)  ondansetron (ZOFRAN) injection 4 mg (4 mg  Intravenous Given 10/30/21 0949)  lidocaine-EPINEPHrine (XYLOCAINE W/EPI) 2 %-1:100000 (with pres) injection (  Given 10/30/21 0949)  fentaNYL (SUBLIMAZE) injection 50 mcg (50 mcg Intravenous Given 10/30/21 1003)    ED Course/ Medical Decision Making/ A&P Clinical Course as of 10/30/21 1505  Sun Oct 30, 2021  1033 [phone] [MK]    Clinical Course User Index [MK] Teressa Lower, MD                           Medical Decision Making  Patient seen emergency department for evaluation of a head injury.  Physical exam reveals a 5 cm laceration superior to the eyebrow on the right as well as a scleral hematoma with possible scleral laceration on the right, total anterior hyphema on the right.  CT of the head max face and C-spine revealing no additional traumatic injury outside of a open globe on the right.  I independently reviewed the scans and my findings agree with radiology findings.  Fluorescein exam performed with no obvious Seidel sign, and ocular pressures were not obtained in the setting of an open globe.  Ophthalmology consulted immediately who came and evaluated the patient at bedside.  Ophthalmology recommending transfer to [facility] for surgical repair.  Patient did have nausea and vomiting here in the emergency department that required multiple doses of Zofran and ultimately required Compazine and Benadryl.  He was placed on broad-spectrum antibiotics with vancomycin and cefepime.  Large gaping facial laceration repaired at bedside requiring figure-of-eight stitches to control bleeding.  Patient then transferred to [facility] for definitive care.         Final Clinical Impression(s) / ED Diagnoses Final diagnoses:  None    Rx / DC Orders ED Discharge Orders     None         Darcia Lampi, MD 10/30/21 1513

## 2021-10-30 NOTE — Progress Notes (Signed)
A consult was received from an ED physician for cefepime & vancomycin per pharmacy dosing.  The patient's profile has been reviewed for  ht/wt/allergies/indication/available labs.    A one time order has been placed for Cefepime 2 gm & Vancomcyin 2500 mg ( 2 gm followed by 500 mg)    Further antibiotics/pharmacy consults should be ordered by admitting physician if indicated.                       Thank you,  Eudelia Bunch, Pharm.D 10/30/2021 9:31 AM

## 2021-10-31 ENCOUNTER — Ambulatory Visit: Payer: Medicare Other | Admitting: Physical Medicine and Rehabilitation

## 2021-11-01 ENCOUNTER — Encounter: Payer: Medicare Other | Admitting: Physical Medicine and Rehabilitation

## 2021-11-15 ENCOUNTER — Encounter: Payer: Self-pay | Admitting: Family Medicine

## 2021-11-15 ENCOUNTER — Other Ambulatory Visit: Payer: Self-pay

## 2021-11-15 ENCOUNTER — Ambulatory Visit: Payer: Medicare Other | Admitting: Family Medicine

## 2021-11-15 VITALS — BP 128/66 | HR 99 | Temp 97.6°F | Ht 71.0 in | Wt 263.0 lb

## 2021-11-15 DIAGNOSIS — H5711 Ocular pain, right eye: Secondary | ICD-10-CM

## 2021-11-15 DIAGNOSIS — G44309 Post-traumatic headache, unspecified, not intractable: Secondary | ICD-10-CM

## 2021-11-15 DIAGNOSIS — Z4802 Encounter for removal of sutures: Secondary | ICD-10-CM | POA: Diagnosis not present

## 2021-11-15 DIAGNOSIS — H9201 Otalgia, right ear: Secondary | ICD-10-CM

## 2021-11-15 MED ORDER — HYDROCODONE-ACETAMINOPHEN 5-325 MG PO TABS: 1.0000 | ORAL_TABLET | Freq: Four times a day (QID) | ORAL | 0 refills | Status: AC | PRN

## 2021-11-15 NOTE — Progress Notes (Signed)
Established Patient Office Visit  Subjective:  Patient ID: [name], male    DOB: [birth year]  Age: 81 y.o. MRN: [id number]  CC:  Chief Complaint  Patient presents with   Ear Pain    C/O right ear pain and headaches x 2 weeks. Sinuses bothering him x 3 days. Would like stiches removed above right eye     HPI [name] presents for follow-up of laceration of right eyebrow and right globe on the eighth of this month.  Transported to [facility] for repair of globe on same day.  Vision remains severely impaired and said right.  Eyebrow sutures need to be removed.  Complains of right ear pain and bitemporal headache.  Denies recent cold symptoms.  There is no change in hearing.  There is some maxillary pain.  He has upper plates.  CT of maxilla obtained in ER visit was negative for any fracture.  He reports a bitemporal headache that has been nonprogressive.  CT of head was negative for fracture or subdural hematoma.  Patient has been sleeping more.  2 Tylenol tend to slow it down.  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[place])    Duodenitis    Edema 05/12/2008   Esophageal stricture    GERD (gastroesophageal reflux disease)    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   Gynecomastia    Hepatic steatosis    HYPERLIPIDEMIA 05/20/2007   Hypertension    Hypogonadism male    Morbid obesity ([place]) 09/10/2009   OBSTRUCTIVE SLEEP APNEA 11/04/2007   Personal history of colonic polyps    RENAL INSUFFICIENCY 05/12/2008   Sleep apnea    uses cpap   Squamous cell carcinoma of skin 02/16/2020   left lower leg, anterior-cx3,14f   Urolithiasis     Past Surgical History:  Procedure Laterality Date   Abdominal UKorea 09/1997   arm fracture Left 1958   with  hardware   Colon cancer screening     COLONOSCOPY  08/18/2004   diverticulitis   COLONOSCOPY  09/24/2009   ELECTROCARDIOGRAM  02/2006   FLEXIBLE SIGMOIDOSCOPY  03/04/2001   polyps, anal fissure   GANGLION CYST EXCISION Left 1980   HIP PINNING,CANNULATED Right 04/25/2021   Procedure: CANNULATED HIP PINNING;  Surgeon: SRod Can MD;  Location: [facility];  Service: Orthopedics;  Laterality: Right;   Lower Arterial  04/13/2004   POLYPECTOMY     Rest Cardiolite  03/19/2003    Family History  Problem Relation Age of Onset   Colon cancer Brother 528  Colon polyps Brother    Lung cancer Brother    Other Mother        MVA, deceased 581s  Healthy Daughter    Healthy Son    Rectal cancer Neg Hx    Stomach cancer Neg Hx     Social History   Socioeconomic History   Marital status: Married    Spouse name: Not on file   Number of children: Not on file   Years of education: Not on file   Highest education level: Not on file  Occupational History    Employer: [employer]  Tobacco Use   Smoking status: Former  Packs/day: 2.00    Years: 31.00    Pack years: 62.00    Types: Cigarettes    Start date: 84    Quit date: 10/23/1982    Years since quitting: 39.0   Smokeless tobacco: Never  Vaping Use   Vaping Use: Never used  Substance and Sexual Activity   Alcohol use: No    Alcohol/week: 0.0 standard drinks   Drug use: No   Sexual activity: Not on file  Other Topics Concern   Not on file  Social History Narrative   Lives with wife in a one story home.  Has a son and a daughter.     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     Education: 2 years of college.   Social Determinants of Health   Financial Resource Strain: Low Risk    Difficulty of Paying Living Expenses: Not hard at all  Food Insecurity: No Food Insecurity   Worried About Charity fundraiser in the Last Year: Never true   [place] in the Last Year: Never true  Transportation Needs: No  Transportation Needs   Lack of Transportation (Medical): No   Lack of Transportation (Non-Medical): No  Physical Activity: Inactive   Days of Exercise per Week: 0 days   Minutes of Exercise per Session: 0 min  Stress: No Stress Concern Present   Feeling of Stress : Not at all  Social Connections: Moderately Integrated   Frequency of Communication with Friends and Family: More than three times a week   Frequency of Social Gatherings with Friends and Family: More than three times a week   Attends Religious Services: Never   Marine scientist or Organizations: Yes   Attends Music therapist: More than 4 times per year   Marital Status: Married  Human resources officer Violence: Not At Risk   Fear of Current or Ex-Partner: No   Emotionally Abused: No   Physically Abused: No   Sexually Abused: No    Outpatient Medications Prior to Visit  Medication Sig Dispense Refill   Accu-Chek FastClix Lancets MISC USE TO CHECK BLOOD SUGAR UP TO 6 TIMES DAILY 612 each 3   alfuzosin (UROXATRAL) 10 MG 24 hr tablet TAKE 1 TABLET BY MOUTH  DAILY WITH BREAKFAST 90 tablet 3   allopurinol (ZYLOPRIM) 300 MG tablet TAKE 1 TABLET BY MOUTH  DAILY 90 tablet 3   amitriptyline (ELAVIL) 50 MG tablet Take 0.5 tablets (25 mg total) by mouth at bedtime. 45 tablet 3   Blood Glucose Monitoring Suppl (ACCU-CHEK AVIVA PLUS) w/Device KIT Use as directed 1 kit 0   cetirizine (ZYRTEC) 10 MG tablet Take 10 mg by mouth daily as needed for allergies.     Continuous Blood Gluc Sensor (DEXCOM G6 SENSOR) MISC Inject 1 Device into the skin as directed. Apply to skin SQ every 10 days 9 each 3   diclofenac Sodium (VOLTAREN) 1 % GEL Apply 2 g topically 4 (four) times daily. 150 g 3   DULoxetine (CYMBALTA) 60 MG capsule Take 1 capsule (60 mg total) by mouth daily. 90 capsule 3   fluticasone (FLONASE) 50 MCG/ACT nasal spray Place 1 spray into the nose daily as needed for allergies.      folic acid (FOLVITE) 1 MG tablet Take 1  mg by mouth daily.     furosemide (LASIX) 20 MG tablet TAKE 1 TABLET BY MOUTH  DAILY 90 tablet 3   glucose blood (ACCU-CHEK AVIVA  PLUS) test strip USE TO TEST BLOOD SUGAR 6  TIMES PER DAY; E11.9 600 each 11   HUMALOG 100 UNIT/ML injection INJECT SUBCUTANEOUSLY VIA  INSULIN PUMP TOTAL OF 120  UNITS PER DAY 110 mL 3   hypromellose (GENTEAL) 0.3 % GEL ophthalmic ointment      Insulin Infusion Pump (T: SLIM X2 INS PMP/CONTROL 7.4) DEVI by Does not apply route.     Insulin Infusion Pump Supplies (AUTOSOFT XC INFUSION SET) MISC Inject 1 Act into the skin every other day. Use with 45m cannula and 23 inch tubing. *5 boxes (90 day supply) 5 each 3   Insulin Infusion Pump Supplies (T:SLIM INSULIN CARTRIDGE 3ML) MISC Use with insulin pump, fill once every 2 days. 5 each 3   moxifloxacin (VIGAMOX) 0.5 % ophthalmic solution Place 1 drop into the right eye 4 times daily.     Multiple Vitamins-Minerals (CENTRUM SILVER PO) Take 1 tablet by mouth daily.     Omega-3 Fatty Acids (FISH OIL) 1200 MG CAPS Take 1,200 mg by mouth daily.     omeprazole (PRILOSEC) 20 MG capsule Take 1 capsule (20 mg total) by mouth daily as needed. 90 capsule 2   rosuvastatin (CRESTOR) 40 MG tablet TAKE 1 TABLET BY MOUTH AT  BEDTIME 90 tablet 3   vitamin B-12 (CYANOCOBALAMIN) 1000 MCG tablet Take 1,000 mcg by mouth daily.     atropine 1 % ophthalmic solution Place 1 drop into the right eye 2 (two) times daily.     Dulaglutide (TRULICITY) 4.5 MVC/9.4WHSOPN Inject 4.5 mg as directed once a week. (Patient not taking: Reported on 11/15/2021) 6 mL 3   losartan (COZAAR) 25 MG tablet Take 12.5 mg by mouth daily. (Patient not taking: Reported on 11/15/2021)     moxifloxacin (VIGAMOX) 0.5 % ophthalmic solution Place 1 drop into the right eye 4 (four) times daily.     prednisoLONE acetate (PRED FORTE) 1 % ophthalmic suspension Place 1 drop into the right eye every 2 (two) hours.     No facility-administered medications prior to visit.    Allergies   Allergen Reactions   Lipitor [Atorvastatin] Other (See Comments)    unknown   Niacin     REACTION: Severe heartburn   Pioglitazone     REACTION: Edema    ROS Review of Systems  Constitutional:  Positive for fatigue. Negative for chills, diaphoresis, fever and unexpected weight change.  HENT:  Negative for congestion, postnasal drip, rhinorrhea and sneezing.   Eyes:  Positive for pain and visual disturbance. Negative for photophobia.  Respiratory: Negative.    Cardiovascular: Negative.   Gastrointestinal: Negative.   Endocrine: Negative for polyphagia and polyuria.  Genitourinary: Negative.   Musculoskeletal:  Positive for arthralgias.  Skin:  Positive for wound.  Neurological:  Positive for headaches. Negative for weakness and numbness.  Psychiatric/Behavioral: Negative.       Objective:    Physical Exam Vitals and nursing note reviewed.  Constitutional:      General: He is not in acute distress.    Appearance: Normal appearance. He is not ill-appearing, toxic-appearing or diaphoretic.  HENT:     Head: Normocephalic.     Right Ear: Tympanic membrane, ear canal and external ear normal. There is no impacted cerumen.     Left Ear: Tympanic membrane, ear canal and external ear normal. There is no impacted cerumen.  Eyes:     General: No scleral icterus.       Right eye: No discharge.  Left eye: No discharge.     Conjunctiva/sclera: Conjunctivae normal.   Musculoskeletal:     Cervical back: No rigidity or tenderness.  Lymphadenopathy:     Cervical: No cervical adenopathy.  Skin:    General: Skin is warm and dry.       Neurological:     Mental Status: He is alert and oriented to person, place, and time.  Psychiatric:        Mood and Affect: Mood normal.        Behavior: Behavior normal.   History of Present Illness   Patient Identification [name] is a 81 y.o. male.  Patient information was obtained from patient. History/Exam limitations:  none. Patient presented to the Emergency Department by private vehicle.  Chief Complaint    Patient presents for suture removal. Injury occurred 17 days ago.  The patient denies wound problems or concerns.   5 cm laceration through the right eyebrow healing without erythema or discharge.  There was overlying scab that was delicately dissected and removed.  6 Prolene sutures were then removed.  Patient tolerated well. Family History  Problem Relation Age of Onset   Colon cancer Brother 51   Colon polyps Brother    Lung cancer Brother    Other Mother        MVA, deceased 56s   Healthy Daughter    Healthy Son    Rectal cancer Neg Hx    Stomach cancer Neg Hx    Current Outpatient Medications  Medication Sig Dispense Refill   Accu-Chek FastClix Lancets MISC USE TO CHECK BLOOD SUGAR UP TO 6 TIMES DAILY 612 each 3   alfuzosin (UROXATRAL) 10 MG 24 hr tablet TAKE 1 TABLET BY MOUTH  DAILY WITH BREAKFAST 90 tablet 3   allopurinol (ZYLOPRIM) 300 MG tablet TAKE 1 TABLET BY MOUTH  DAILY 90 tablet 3   amitriptyline (ELAVIL) 50 MG tablet Take 0.5 tablets (25 mg total) by mouth at bedtime. 45 tablet 3   Blood Glucose Monitoring Suppl (ACCU-CHEK AVIVA PLUS) w/Device KIT Use as directed 1 kit 0   cetirizine (ZYRTEC) 10 MG tablet Take 10 mg by mouth daily as needed for allergies.     Continuous Blood Gluc Sensor (DEXCOM G6 SENSOR) MISC Inject 1 Device into the skin as directed. Apply to skin SQ every 10 days 9 each 3   diclofenac Sodium (VOLTAREN) 1 % GEL Apply 2 g topically 4 (four) times daily. 150 g 3   DULoxetine (CYMBALTA) 60 MG capsule Take 1 capsule (60 mg total) by mouth daily. 90 capsule 3   fluticasone (FLONASE) 50 MCG/ACT nasal spray Place 1 spray into the nose daily as needed for allergies.      folic acid (FOLVITE) 1 MG tablet Take 1 mg by mouth daily.     furosemide (LASIX) 20 MG tablet TAKE 1 TABLET BY MOUTH  DAILY 90 tablet 3   glucose blood (ACCU-CHEK AVIVA PLUS) test strip USE TO  TEST BLOOD SUGAR 6  TIMES PER DAY; E11.9 600 each 11   HUMALOG 100 UNIT/ML injection INJECT SUBCUTANEOUSLY VIA  INSULIN PUMP TOTAL OF 120  UNITS PER DAY 110 mL 3   HYDROcodone-acetaminophen (NORCO) 5-325 MG tablet Take 1 tablet by mouth every 6 (six) hours as needed for up to 7 days for moderate pain. 30 tablet 0   hypromellose (GENTEAL) 0.3 % GEL ophthalmic ointment      Insulin Infusion Pump (T: SLIM X2 INS PMP/CONTROL 7.4) DEVI by Does not apply  route.     Insulin Infusion Pump Supplies (AUTOSOFT XC INFUSION SET) MISC Inject 1 Act into the skin every other day. Use with 36m cannula and 23 inch tubing. *5 boxes (90 day supply) 5 each 3   Insulin Infusion Pump Supplies (T:SLIM INSULIN CARTRIDGE 3ML) MISC Use with insulin pump, fill once every 2 days. 5 each 3   moxifloxacin (VIGAMOX) 0.5 % ophthalmic solution Place 1 drop into the right eye 4 times daily.     Multiple Vitamins-Minerals (CENTRUM SILVER PO) Take 1 tablet by mouth daily.     Omega-3 Fatty Acids (FISH OIL) 1200 MG CAPS Take 1,200 mg by mouth daily.     omeprazole (PRILOSEC) 20 MG capsule Take 1 capsule (20 mg total) by mouth daily as needed. 90 capsule 2   rosuvastatin (CRESTOR) 40 MG tablet TAKE 1 TABLET BY MOUTH AT  BEDTIME 90 tablet 3   vitamin B-12 (CYANOCOBALAMIN) 1000 MCG tablet Take 1,000 mcg by mouth daily.     atropine 1 % ophthalmic solution Place 1 drop into the right eye 2 (two) times daily.     Dulaglutide (TRULICITY) 4.5 MBV/6.9IHSOPN Inject 4.5 mg as directed once a week. (Patient not taking: Reported on 11/15/2021) 6 mL 3   losartan (COZAAR) 25 MG tablet Take 12.5 mg by mouth daily. (Patient not taking: Reported on 11/15/2021)     moxifloxacin (VIGAMOX) 0.5 % ophthalmic solution Place 1 drop into the right eye 4 (four) times daily.     prednisoLONE acetate (PRED FORTE) 1 % ophthalmic suspension Place 1 drop into the right eye every 2 (two) hours.     No current facility-administered medications for this visit.    Allergies  Allergen Reactions   Lipitor [Atorvastatin] Other (See Comments)    unknown   Niacin     REACTION: Severe heartburn   Pioglitazone     REACTION: Edema   Social History   Socioeconomic History   Marital status: Married    Spouse name: Not on file   Number of children: Not on file   Years of education: Not on file   Highest education level: Not on file  Occupational History    Employer: [employer]  Tobacco Use   Smoking status: Former    Packs/day: 2.00    Years: 31.00    Pack years: 62.00    Types: Cigarettes    Start date: 89   Quit date: 10/23/1982    Years since quitting: 39.0   Smokeless tobacco: Never  Vaping Use   Vaping Use: Never used  Substance and Sexual Activity   Alcohol use: No    Alcohol/week: 0.0 standard drinks   Drug use: No   Sexual activity: Not on file  Other Topics Concern   Not on file  Social History Narrative   Lives with wife in a one story home.  Has a son and a daughter.     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   Social Determinants of Health   Financial Resource Strain: Low Risk    Difficulty of Paying Living Expenses: Not hard at all  Food Insecurity: No Food Insecurity   Worried About RCharity fundraiserin the Last Year: Never true   RRot[place] the Last Year: Never true  Transportation Needs: No Transportation Needs   Lack of Transportation (Medical): No   Lack of Transportation (Non-Medical): No  Physical Activity: Inactive   Days of Exercise per Week: 0 days   Minutes of Exercise per Session: 0 min  Stress: No Stress Concern Present   Feeling of Stress : Not at all  Social Connections: Moderately Integrated   Frequency of Communication with Friends and Family: More than three times a week   Frequency of Social Gatherings with Friends and Family: More than three times a week   Attends Religious Services: Never   Marine scientist or  Organizations: Yes   Attends Music therapist: More than 4 times per year   Marital Status: Married  Human resources officer Violence: Not At Risk   Fear of Current or Ex-Partner: No   Emotionally Abused: No   Physically Abused: No   Sexually Abused: No   Review of Systems Pertinent items are noted in HPI.   Physical Exam   BP 128/66 (BP Location: Right Arm, Patient Position: Sitting, Cuff Size: Large)    Pulse 99    Temp 97.6 F (36.4 C) (Temporal)    Ht '5\' 11"'  (1.803 m)    Wt 263 lb (119.3 kg)    SpO2 95%    BMI 36.68 kg/m  Wound is healing adequately. There is not signs of infection.  ED Course   Studies: Imaging: CT scan: Head:  no bony fractures with rupture of right globe  Treatments: Sutures were removed. Continued wound care discussed.  Disposition: Diagnostic tests were reviewed and questions answered. Diagnosis, care plan and treatment options were discussed. The spouse/SO understand instructions and will follow up as directed.  BP 128/66 (BP Location: Right Arm, Patient Position: Sitting, Cuff Size: Large)    Pulse 99    Temp 97.6 F (36.4 C) (Temporal)    Ht '5\' 11"'  (1.803 m)    Wt 263 lb (119.3 kg)    SpO2 95%    BMI 36.68 kg/m  Wt Readings from Last 3 Encounters:  11/15/21 263 lb (119.3 kg)  09/20/21 269 lb (122 kg)  09/01/21 266 lb 6.4 oz (120.8 kg)     Health Maintenance Due  Topic Date Due   Zoster Vaccines- Shingrix (1 of 2) Never done   OPHTHALMOLOGY EXAM  08/20/2021    There are no preventive care reminders to display for this patient.  Lab Results  Component Value Date   TSH 2.98 09/21/2020   Lab Results  Component Value Date   WBC 5.1 08/24/2021   HGB 14.3 08/24/2021   HCT 42.7 08/24/2021   MCV 91.5 08/24/2021   PLT 135.0 (L) 08/24/2021   Lab Results  Component Value Date   NA 141 08/24/2021   K 3.7 08/24/2021   CO2 29 08/24/2021   GLUCOSE 108 (H) 08/24/2021   BUN 17 08/24/2021   CREATININE 1.52 (H) 08/24/2021   BILITOT 0.7  02/28/2021   ALKPHOS 82 02/28/2021   AST 18 02/28/2021   ALT 20 02/28/2021   PROT 6.6 02/28/2021   ALBUMIN 4.0 02/28/2021   CALCIUM 9.0 08/24/2021   ANIONGAP 9 04/27/2021   GFR 43.21 (L) 08/24/2021   Lab Results  Component Value Date   CHOL 135 02/28/2021   Lab Results  Component Value Date   HDL 33.60 (L) 02/28/2021   Lab Results  Component Value Date   LDLCALC 66 02/28/2021   Lab Results  Component Value Date   TRIG 174.0 (H) 02/28/2021   Lab Results  Component Value Date   CHOLHDL 4 02/28/2021   Lab Results  Component  Value Date   HGBA1C 6.6 (A) 08/22/2021      Assessment & Plan:   Problem List Items Addressed This Visit       Other   Eye pain, right   Relevant Medications   HYDROcodone-acetaminophen (NORCO) 5-325 MG tablet   Ear pain, referred, right   Relevant Medications   HYDROcodone-acetaminophen (NORCO) 5-325 MG tablet   Post-traumatic headache, not intractable - Primary   Relevant Medications   HYDROcodone-acetaminophen (NORCO) 5-325 MG tablet   Visit for suture removal    Meds ordered this encounter  Medications   HYDROcodone-acetaminophen (NORCO) 5-325 MG tablet    Sig: Take 1 tablet by mouth every 6 (six) hours as needed for up to 7 days for moderate pain.    Dispense:  30 tablet    Refill:  0    Follow-up: Return in about 2 weeks (around 11/29/2021), or if symptoms worsen or fail to improve.  Between review of the medical record, history and exam and suture removal spent over 40 minutes with this patient.  Suture removal was complicated by overlying scab that required debridement.  Libby Maw, MD

## 2021-11-22 ENCOUNTER — Other Ambulatory Visit: Payer: Self-pay

## 2021-11-22 ENCOUNTER — Encounter: Payer: Self-pay | Admitting: Physical Medicine and Rehabilitation

## 2021-11-22 ENCOUNTER — Encounter: Payer: Medicare Other | Attending: Physical Medicine & Rehabilitation | Admitting: Physical Medicine and Rehabilitation

## 2021-11-22 VITALS — BP 128/81 | HR 103 | Temp 97.8°F | Ht 71.0 in | Wt 263.0 lb

## 2021-11-22 DIAGNOSIS — G8929 Other chronic pain: Secondary | ICD-10-CM | POA: Diagnosis not present

## 2021-11-22 DIAGNOSIS — M545 Low back pain, unspecified: Secondary | ICD-10-CM | POA: Insufficient documentation

## 2021-11-22 DIAGNOSIS — E1142 Type 2 diabetes mellitus with diabetic polyneuropathy: Secondary | ICD-10-CM | POA: Insufficient documentation

## 2021-11-22 MED ORDER — TOPIRAMATE 25 MG PO TABS: 25.0000 mg | ORAL_TABLET | Freq: Every evening | ORAL | 3 refills | Status: DC

## 2021-11-22 NOTE — Progress Notes (Addendum)
Subjective:    Patient ID: [name], male    DOB: 1940/12/01, 81 y.o.   MRN: [id number]   HPI  Male with pmh of OSA, CKD, HTN, DM type 1 with peripheral neuropathy presents for follow-up of low back pain > neck pain.   On first visit, pt complained of b/l L>R back pain.  Started ~>20 years ago.  No inciting event.  Sitting/laying improve the pain.  Standing exacerbates the pain.  Sharp, burning pain.  Radiates to left posterior thigh.  Intermittent.  He has associated numbness/tingling.  He only tried ASA, which helps some.  Pain limits from doing ADLs and fishing.  He denies falls.  Last clinic visit on 10/21/2020.  He had trigger point injections at that time.  Since that time, patient states he had great benefit for about a week.  He uses a TENS with benefit. Patient states he feels he is getting weaker. He is taking Baclofen 1-2/week.  Sleep has improved. He is taking Elavil 25.  He states he believe he lost weight, but not exercising. Denies falls. Lightheadedness has improved.    [name] presents today for removal of his SPRINT PNS. He found it beneficial and used it daily at a high setting. He feels it would be more beneficial at a higher level and would like to try it again bilaterally. He does not take any other medications for his pain and does not desire to. He requires a cane for ambulation and has a handicap placard.   Blood pressure elevated today.  Had a fall at home.  Hurt his right eye He has been to multiple speciialists and was told he will not be able to see from this eye. Has not been using SPRINT PNS but it did help when he used it.  Has been having headaches.    Pain Inventory Average Pain 5 Pain Right Now 8 My pain is intermittent, sharp, burning, stabbing and tingling  In the last 24 hours, has pain interfered with the following? General activity 4 Relation with others 9 Enjoyment of life 9 What TIME of day is your pain at its worst?  all Sleep (in general)  Good  Pain is worse with: walking and standing Pain improves with: rest and TENS Relief from Meds:  n/a'     Family History  Problem Relation Age of Onset   Colon cancer Brother 44   Colon polyps Brother    Lung cancer Brother    Other Mother        MVA, deceased 70s   Healthy Daughter    Healthy Son    Rectal cancer Neg Hx    Stomach cancer Neg Hx    Social History   Socioeconomic History   Marital status: Married    Spouse name: Not on file   Number of children: Not on file   Years of education: Not on file   Highest education level: Not on file  Occupational History    Employer: [employer]  Tobacco Use   Smoking status: Former    Packs/day: 2.00    Years: 31.00    Pack years: 62.00    Types: Cigarettes    Start date: 1955    Quit date: 10/23/1982    Years since quitting: 39.1   Smokeless tobacco: Never  Vaping Use   Vaping Use: Never used  Substance and Sexual Activity   Alcohol use: No    Alcohol/week: 0.0 standard drinks   Drug use:  No   Sexual activity: Not on file  Other Topics Concern   Not on file  Social History Narrative   Lives with wife in a one story home.  Has a son and a daughter.     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     Education: 2 years of college.   Social Determinants of Health   Financial Resource Strain: Low Risk    Difficulty of Paying Living Expenses: Not hard at all  Food Insecurity: No Food Insecurity   Worried About Charity fundraiser in the Last Year: Never true   [place] in the Last Year: Never true  Transportation Needs: No Transportation Needs   Lack of Transportation (Medical): No   Lack of Transportation (Non-Medical): No  Physical Activity: Inactive   Days of Exercise per Week: 0 days   Minutes of Exercise per Session: 0 min  Stress: No Stress Concern Present   Feeling of Stress : Not at all  Social Connections: Moderately Integrated   Frequency of Communication with Friends  and Family: More than three times a week   Frequency of Social Gatherings with Friends and Family: More than three times a week   Attends Religious Services: Never   Marine scientist or Organizations: Yes   Attends Music therapist: More than 4 times per year   Marital Status: Married   Past Surgical History:  Procedure Laterality Date   Abdominal US  09/1997   arm fracture Left 1958   with hardware   Colon cancer screening     COLONOSCOPY  08/18/2004   diverticulitis   COLONOSCOPY  09/24/2009   ELECTROCARDIOGRAM  02/2006   FLEXIBLE SIGMOIDOSCOPY  03/04/2001   polyps, anal fissure   GANGLION CYST EXCISION Left 1980   HIP PINNING,CANNULATED Right 04/25/2021   Procedure: CANNULATED HIP PINNING;  Surgeon: Rod Can, MD;  Location: [facility];  Service: Orthopedics;  Laterality: Right;   Lower Arterial  04/13/2004   POLYPECTOMY     Rest Cardiolite  03/19/2003   Past Medical History:  Diagnosis Date   Allergy    Anal fissure    Anemia, unspecified    Arthritis    Spinal OA   BACK PAIN, LUMBAR 10/23/2007   CARDIAC MURMUR, SYSTOLIC 01/26/6255   DIABETES MELLITUS, TYPE I 3/89/3734   DIASTOLIC DYSFUNCTION 2/87/6811   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[place])    Duodenitis    Edema 05/12/2008   Esophageal stricture    GERD (gastroesophageal reflux disease)    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  Opioid Risk Score:   Fall Risk Score:  `1  Depression screen PHQ 2/9  Depression screen [facility] 2/9 11/15/2021 08/24/2021 07/19/2021 05/19/2021 02/21/2021 01/04/2021 10/21/2020   Decreased Interest 0 0 0 0 0 0 0  Down, Depressed, Hopeless 0 0 0 0 0 0 0  PHQ - 2 Score 0 0 0 0 0 0 0  Some recent data might be hidden  Review of Systems  HENT: Negative.    Eyes: Negative.   Respiratory:  Positive for apnea and shortness of breath.   Cardiovascular: Negative.   Gastrointestinal: Negative.   Endocrine:       Diabetic High blood sugar  Genitourinary: Negative.           Musculoskeletal:  Positive for arthralgias, back pain, gait problem, myalgias and neck pain.  Skin: Negative.   Allergic/Immunologic: Negative.   Neurological:  Positive for weakness and numbness.       Tingling  Hematological: Negative.   All other systems reviewed and are negative.     Objective:   Physical Exam  Constitutional: No distress . Vital signs reviewed. BMI 36.68, weight 263 lbs HENT: Normocephalic.  Atraumatic. Eyes: EOMI. No discharge. Cardiovascular: No JVD.   Respiratory: Normal effort.  No stridor.   GI: Non-distended.   Skin: SPRINT PNS is lead is clean/dry/intact- no bleeding upon removal. Psych: Normal mood.  Normal behavior. Musc: TTP lumbar spinous process Musc: Gait: Mildly antalgic Neuro: Alert  HOH             Strength          5/5 in all LE myotomes    Assessment & Plan:  Male with pmh of OSA, CKD, HTN, DM type 1 with peripheral neuropathy presents for follow up of low back pain > neck pain.     1. Chronic mechanical low back pain             MRIs C,L-spine early 2016 suggesting multilevel spondylosis with mild b/l multilevel foraminal narrowing C4-C7 and shallow disc bulge at L5-S1 without central canal or foraminal stenosis.              Avoid NSAIDs due to CKD             Unable to tolerate Gabapentin, Lyrica             Cont HEP  Continue TENS             Aquatic therapy, not going anymore, states ineffective             Cont tylenol  Robaxin 500 TID PRN, changed to Baclofen 10 TID PRN due to change insurance, continue   Continue Cymbalta  60mg              Cont back brace during periods of excessive activity, reminded to avoid prolonged use  Continue Lidoderm OTC, reminded              Encouraged rest breaks             Acknowledges he can do more if he make a conscious effort             PT completed             Continue Voltaren gel   Pain over spinous process where clothing is resulting increased pressure.  Encourage change in clothing.   Sprint PNS removed today.   -Provided with a pain relief journal and discussed that it contains foods and lifestyle tips to naturally help to improve pain. Discussed that these lifestyle strategies are also very good for health unlike some medications which can have negative side effects. Discussed that the act of keeping a journal can be therapeutic and helpful to realize patterns what helps to trigger and alleviate pain.     2. Sacroiliitis             See #  1             Continue brace             Not main pain generator at present, does not wish to proceed with injection at this time, particularly due to DM   3. Sleep disturbance             Recently new mattress             Cont CPAP             Improved   4. Myalgia  Will schedule for trigger point injections   5. Diabetic neuropathy             See #1, #3             Side effects with elavil to 50mg  qhs, continue 25mg  educated on signs/symptoms of seratonin syndrome previously  Refilled Cymbalta.  -Discussed current symptoms of pain and history of pain.  -Discussed benefits of exercise in reducing pain. -Discussed following foods that may reduce pain: 1) Ginger (especially studied for arthritis)- reduce leukotriene production to decrease inflammation 2) Blueberries- high in phytonutrients that decrease inflammation 3) Salmon- marine omega-3s reduce joint swelling and pain 4) Pumpkin seeds- reduce inflammation 5) dark chocolate- reduces inflammation 6) turmeric- reduces inflammation 7) tart cherries - reduce pain and  stiffness 8) extra virgin olive oil - its compound olecanthal helps to block prostaglandins  9) chili peppers- can be eaten or applied topically via capsaicin 10) mint- helpful for headache, muscle aches, joint pain, and itching 11) garlic- reduces inflammation  Link to further information on diet for chronic pain: [email or web address].[email or web address]   -Discussed Qutenza as an option for neuropathic pain control. Discussed that this is a capsaicin patch, stronger than capsaicin cream. Discussed that it is currently approved for diabetic peripheral neuropathy and post-herpetic neuralgia, but that it has also shown benefit in treating other forms of neuropathy. Provided patient with link to site to learn more about the patch: CinemaBonus.fr. Discussed that the patch would be placed in office and benefits usually last 3 months. Discussed that unintended exposure to capsaicin can cause severe irritation of eyes, mucous membranes, respiratory tract, and skin, but that Qutenza is a local treatment and does not have the systemic side effects of other nerve medications. Discussed that there may be pain, itching, erythema, and decreased sensory function associated with the application of Qutenza. Side effects usually subside within 1 week. A cold pack of analgesic medications can help with these side effects. Blood pressure can also be increased due to pain associated with administration of the patch.    6. Morbid obesity             Not interested in seeing dietitian at this time  Encouraged activity   No attempts at weight loss   7. Gait abnormality             Completed therapies, cont HEP  Continue cane for safety   8. Nonhealing ulcer             Left lateral foot, improving  9. Lightheadedness  Encouraged BP monitoring at home

## 2021-11-22 NOTE — Addendum Note (Signed)
Addended by: Izora Ribas on: 11/22/2021 02:14 PM   Modules accepted: Orders

## 2021-11-23 ENCOUNTER — Ambulatory Visit: Payer: Medicare Other | Admitting: Endocrinology

## 2021-11-23 ENCOUNTER — Encounter: Payer: Self-pay | Admitting: Endocrinology

## 2021-11-23 VITALS — BP 132/76 | HR 94 | Ht 71.0 in | Wt 263.4 lb

## 2021-11-23 DIAGNOSIS — E1122 Type 2 diabetes mellitus with diabetic chronic kidney disease: Secondary | ICD-10-CM | POA: Diagnosis not present

## 2021-11-23 DIAGNOSIS — E1159 Type 2 diabetes mellitus with other circulatory complications: Secondary | ICD-10-CM | POA: Diagnosis not present

## 2021-11-23 DIAGNOSIS — E1142 Type 2 diabetes mellitus with diabetic polyneuropathy: Secondary | ICD-10-CM

## 2021-11-23 DIAGNOSIS — E1165 Type 2 diabetes mellitus with hyperglycemia: Secondary | ICD-10-CM

## 2021-11-23 DIAGNOSIS — E0800 Diabetes mellitus due to underlying condition with hyperosmolarity without nonketotic hyperglycemic-hyperosmolar coma (NKHHC): Secondary | ICD-10-CM

## 2021-11-23 DIAGNOSIS — N184 Chronic kidney disease, stage 4 (severe): Secondary | ICD-10-CM | POA: Diagnosis not present

## 2021-11-23 LAB — POCT GLYCOSYLATED HEMOGLOBIN (HGB A1C): Hemoglobin A1C: 6.8 % — AB (ref 4.0–5.6)

## 2021-11-23 MED ORDER — OZEMPIC (0.25 OR 0.5 MG/DOSE) 2 MG/1.5ML ~~LOC~~ SOPN: 0.5000 mg | PEN_INJECTOR | SUBCUTANEOUS | 3 refills | Status: DC

## 2021-11-23 NOTE — Progress Notes (Signed)
Subjective:    Patient ID: [name], male    DOB: 01-Aug-1941, 81 y.o.   MRN: [id number]  HPI Pt returns for f/u of diabetes mellitus:  DM type: Insulin-requiring type 2.   Dx'ed: 5456 Complications: PN, CAD, and stage 4 CRI.   Therapy: insulin since 1995.  DKA: never.  Severe hypoglycemia: never.  Pancreatitis: never.  Other: he started pump therapy (now T-slim, since mid-2015), and dexcom G-6 continuous glucose monitor.   Interval history: he takes these pump settings:   basal rate of 1.5 units/hr (when not in "control-IQ" mode).   mealtime bolus of 1 unit/4 grams carbohydrate.  continue correction bolus (which some people call "sensitivity," or "insulin sensitivity ratio," or just "isr") of 1 unit for each 60 by which your glucose exceeds 100.  "Control-IQ" is on 62% of the time TDD is approx 89 units (53% basal, 44% meal bolus, 2% correction bolus).   I reviewed continuous glucose monitor data.  Glucose varies from 55-390, but most are in the 100's.  It is in general highest at 11AM and 9PM.  it is lowest 1AM-9AM.   He seldom has hypoglycemia, and these episodes are mild.  He has been unable to obtain Trulicity.  He says there can be a 2-HR delay in pump giving a mealtime bolus.  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   Gynecomastia    Hepatic steatosis    HYPERLIPIDEMIA 05/20/2007   Hypertension    Hypogonadism male    Morbid obesity ([place]) 09/10/2009   OBSTRUCTIVE SLEEP APNEA 11/04/2007   Personal history of colonic polyps    RENAL INSUFFICIENCY 05/12/2008   Sleep apnea    uses cpap    Squamous cell carcinoma of skin 02/16/2020   left lower leg, anterior-cx3,41f   Urolithiasis     Past Surgical History:  Procedure Laterality Date   Abdominal [place] 09/1997   arm fracture Left 1958   with hardware   Colon cancer screening     COLONOSCOPY  08/18/2004   diverticulitis   COLONOSCOPY  09/24/2009   ELECTROCARDIOGRAM  02/2006   FLEXIBLE SIGMOIDOSCOPY  03/04/2001   polyps, anal fissure   GANGLION CYST EXCISION Left 1980   HIP PINNING,CANNULATED Right 04/25/2021   Procedure: CANNULATED HIP PINNING;  Surgeon: SRod Can MD;  Location: [facility];  Service: Orthopedics;  Laterality: Right;   Lower Arterial  04/13/2004   POLYPECTOMY     Rest Cardiolite  03/19/2003    Social History   Socioeconomic History   Marital status: Married    Spouse name: Not on file   Number of children: Not on file   Years of education: Not on file   Highest education level: Not on file  Occupational History    Employer: [employer]  Tobacco Use   Smoking status: Former    Packs/day: 2.00    Years: 31.00    Pack years: 62.00    Types: Cigarettes    Start date: 1955    Quit date: 10/23/1982  Years since quitting: 39.1   Smokeless tobacco: Never  Vaping Use   Vaping Use: Never used  Substance and Sexual Activity   Alcohol use: No    Alcohol/week: 0.0 standard drinks   Drug use: No   Sexual activity: Not on file  Other Topics Concern   Not on file  Social History Narrative   Lives with wife in a one story home.  Has a son and a daughter.     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     Education: 2 years of college.   Social Determinants of Health   Financial Resource Strain: Low Risk    Difficulty of Paying Living Expenses: Not hard at all  Food Insecurity: No Food Insecurity   Worried About Charity fundraiser in the Last Year: Never true   [place] in the Last Year: Never true  Transportation Needs: No Transportation Needs   Lack of Transportation  (Medical): No   Lack of Transportation (Non-Medical): No  Physical Activity: Inactive   Days of Exercise per Week: 0 days   Minutes of Exercise per Session: 0 min  Stress: No Stress Concern Present   Feeling of Stress : Not at all  Social Connections: Moderately Integrated   Frequency of Communication with Friends and Family: More than three times a week   Frequency of Social Gatherings with Friends and Family: More than three times a week   Attends Religious Services: Never   Marine scientist or Organizations: Yes   Attends Music therapist: More than 4 times per year   Marital Status: Married  Human resources officer Violence: Not At Risk   Fear of Current or Ex-Partner: No   Emotionally Abused: No   Physically Abused: No   Sexually Abused: No    Current Outpatient Medications on File Prior to Visit  Medication Sig Dispense Refill   Accu-Chek FastClix Lancets MISC USE TO CHECK BLOOD SUGAR UP TO 6 TIMES DAILY 612 each 3   alfuzosin (UROXATRAL) 10 MG 24 hr tablet TAKE 1 TABLET BY MOUTH  DAILY WITH BREAKFAST 90 tablet 3   allopurinol (ZYLOPRIM) 300 MG tablet TAKE 1 TABLET BY MOUTH  DAILY 90 tablet 3   amitriptyline (ELAVIL) 50 MG tablet Take 0.5 tablets (25 mg total) by mouth at bedtime. 45 tablet 3   atropine 1 % ophthalmic solution Place 1 drop into the right eye 2 (two) times daily.     Blood Glucose Monitoring Suppl (ACCU-CHEK AVIVA PLUS) w/Device KIT Use as directed 1 kit 0   cetirizine (ZYRTEC) 10 MG tablet Take 10 mg by mouth daily as needed for allergies.     Continuous Blood Gluc Sensor (DEXCOM G6 SENSOR) MISC Inject 1 Device into the skin as directed. Apply to skin SQ every 10 days 9 each 3   diclofenac Sodium (VOLTAREN) 1 % GEL Apply 2 g topically 4 (four) times daily. 150 g 3   DULoxetine (CYMBALTA) 60 MG capsule Take 1 capsule (60 mg total) by mouth daily. 90 capsule 3   fluticasone (FLONASE) 50 MCG/ACT nasal spray Place 1 spray into the nose daily as needed  for allergies.      folic acid (FOLVITE) 1 MG tablet Take 1 mg by mouth daily.     furosemide (LASIX) 20 MG tablet TAKE 1 TABLET BY MOUTH  DAILY 90 tablet 3   glucose blood (ACCU-CHEK AVIVA PLUS) test strip USE TO TEST BLOOD SUGAR 6  TIMES PER DAY; E11.9 600 each 11   HUMALOG 100 UNIT/ML injection INJECT SUBCUTANEOUSLY VIA  INSULIN PUMP TOTAL OF 120  UNITS PER DAY 110 mL 3   hypromellose (GENTEAL) 0.3 % GEL ophthalmic ointment      hypromellose (GENTEAL) 0.3 % GEL ophthalmic ointment      Insulin Infusion Pump (T: SLIM X2 INS PMP/CONTROL 7.4) DEVI by Does not apply route.     Insulin Infusion Pump Supplies (AUTOSOFT XC INFUSION SET) MISC Inject 1 Act into the skin every other day. Use with 41m cannula and 23 inch tubing. *5 boxes (90 day supply) 5 each 3   Insulin Infusion Pump Supplies (T:SLIM INSULIN CARTRIDGE 3ML) MISC Use with insulin pump, fill once every 2 days. 5 each 3   losartan (COZAAR) 25 MG tablet Take 12.5 mg by mouth daily.     moxifloxacin (VIGAMOX) 0.5 % ophthalmic solution Place 1 drop into the right eye 4 (four) times daily.     moxifloxacin (VIGAMOX) 0.5 % ophthalmic solution Place 1 drop into the right eye 4 times daily.     Multiple Vitamins-Minerals (CENTRUM SILVER PO) Take 1 tablet by mouth daily.     Omega-3 Fatty Acids (FISH OIL) 1200 MG CAPS Take 1,200 mg by mouth daily.     omeprazole (PRILOSEC) 20 MG capsule Take 1 capsule (20 mg total) by mouth daily as needed. 90 capsule 2   prednisoLONE acetate (PRED FORTE) 1 % ophthalmic suspension Place 1 drop into the right eye every 2 (two) hours.     rosuvastatin (CRESTOR) 40 MG tablet TAKE 1 TABLET BY MOUTH AT  BEDTIME 90 tablet 3   tetracaine (PONTOCAINE) 0.5 % ophthalmic solution      topiramate (TOPAMAX) 25 MG tablet Take 1 tablet (25 mg total) by mouth at bedtime. 30 tablet 3   vitamin B-12 (CYANOCOBALAMIN) 1000 MCG tablet Take 1,000 mcg by mouth daily.     No current facility-administered medications on file prior to  visit.    Allergies  Allergen Reactions   Lipitor [Atorvastatin] Other (See Comments)    unknown   Niacin     REACTION: Severe heartburn   Pioglitazone     REACTION: Edema    Family History  Problem Relation Age of Onset   Colon cancer Brother 547  Colon polyps Brother    Lung cancer Brother    Other Mother        MVA, deceased 537s  Healthy Daughter    Healthy Son    Rectal cancer Neg Hx    Stomach cancer Neg Hx     BP 132/76 (BP Location: Left Arm, Patient Position: Sitting, Cuff Size: Normal)    Pulse 94    Ht '5\' 11"'  (1.803 m)    Wt 263 lb 6.4 oz (119.5 kg)    SpO2 97%    BMI 36.74 kg/m    Review of Systems     Objective:   Physical Exam   Lab Results  Component Value Date   HGBA1C 6.8 (A) 11/23/2021      Assessment & Plan:  Insulin-requiring type 2 DM: overcontrolled.  Hypoglycemia, due to insulin: this limits aggressiveness of glycemic control.    Patient Instructions  I have sent a prescription to your pharmacy, to change Trulicity to Ozempic.   Please ask LVaughan Basta why pump takes so long to give bolus.    Please take these pump settings:  basal rate of 1.3 units/hr (when not in "control-IQ" mode).  mealtime bolus of 1 unit/4 grams carbohydrate.  continue correction bolus (which some people call "sensitivity," or "insulin sensitivity ratio," or just "isr") of 1 unit for each 60 by which your glucose exceeds 100.   Please come back for a follow-up appointment in 3 months.

## 2021-11-23 NOTE — Patient Instructions (Addendum)
I have sent a prescription to your pharmacy, to change Trulicity to Ozempic.   Please ask Vaughan Basta, why pump takes so long to give bolus.    Please take these pump settings:  basal rate of 1.3 units/hr (when not in "control-IQ" mode).   mealtime bolus of 1 unit/4 grams carbohydrate.  continue correction bolus (which some people call "sensitivity," or "insulin sensitivity ratio," or just "isr") of 1 unit for each 60 by which your glucose exceeds 100.   Please come back for a follow-up appointment in 3 months.

## 2021-11-28 ENCOUNTER — Other Ambulatory Visit: Payer: Self-pay

## 2021-11-29 ENCOUNTER — Ambulatory Visit: Payer: Medicare Other | Admitting: Family Medicine

## 2021-11-29 ENCOUNTER — Encounter: Payer: Self-pay | Admitting: Family Medicine

## 2021-11-29 VITALS — BP 134/66 | HR 83 | Temp 97.0°F | Ht 71.0 in | Wt 261.2 lb

## 2021-11-29 DIAGNOSIS — H5711 Ocular pain, right eye: Secondary | ICD-10-CM | POA: Diagnosis not present

## 2021-11-29 DIAGNOSIS — T887XXA Unspecified adverse effect of drug or medicament, initial encounter: Secondary | ICD-10-CM

## 2021-11-29 MED ORDER — OXYCODONE-ACETAMINOPHEN 2.5-325 MG PO TABS: 1.0000 | ORAL_TABLET | Freq: Three times a day (TID) | ORAL | 0 refills | Status: DC | PRN

## 2021-11-29 NOTE — Progress Notes (Signed)
Established Patient Office Visit  Subjective:  Patient ID: [name], male    DOB: 09/01/1941  Age: 81 y.o. MRN: [id number]  CC:  Chief Complaint  Patient presents with   Follow-up    2 week follow up, patient seems to be dizzy a lot would like a different pain medication.     HPI [name] presents for follow-up of pain of OD radiating to the temporal area of his head.  Continues antibiotic and steroid drops for the.  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  There is been no discharge from the eye or morning matting.  He has had no fevers.  He did not tolerate the hydrocodone.  It caused lightheadedness and mental status changes.  He has discontinued it.  He had the same response to all tramadol in the past.  Seen by neurology recently and they have recommended Topamax.  He and his wife did not fill the medication out of concern for side effects.  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[place])    Duodenitis    Edema 05/12/2008   Esophageal stricture    GERD (gastroesophageal reflux disease)    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   Gynecomastia    Hepatic steatosis    HYPERLIPIDEMIA 05/20/2007   Hypertension    Hypogonadism male    Morbid obesity ([place]) 09/10/2009   OBSTRUCTIVE SLEEP APNEA 11/04/2007   Personal history of colonic polyps    RENAL INSUFFICIENCY 05/12/2008   Sleep apnea    uses cpap   Squamous cell carcinoma of skin 02/16/2020   left lower leg, anterior-cx3,24f   Urolithiasis     Past Surgical History:  Procedure Laterality Date   Abdominal UKorea 09/1997   arm fracture Left 1958   with hardware   Colon cancer screening     COLONOSCOPY  08/18/2004   diverticulitis   COLONOSCOPY  09/24/2009   ELECTROCARDIOGRAM  02/2006    FLEXIBLE SIGMOIDOSCOPY  03/04/2001   polyps, anal fissure   GANGLION CYST EXCISION Left 1980   HIP PINNING,CANNULATED Right 04/25/2021   Procedure: CANNULATED HIP PINNING;  Surgeon: SRod Can MD;  Location: [facility];  Service: Orthopedics;  Laterality: Right;   Lower Arterial  04/13/2004   POLYPECTOMY     Rest Cardiolite  03/19/2003    Family History  Problem Relation Age of Onset   Colon cancer Brother 588  Colon polyps Brother    Lung cancer Brother    Other Mother        MVA, deceased 531s  Healthy Daughter    Healthy Son    Rectal cancer Neg Hx    Stomach cancer Neg Hx     Social History   Socioeconomic History   Marital status: Married    Spouse name: Not on file   Number of children: Not on file   Years of education: Not on file   Highest education level: Not on file  Occupational History    Employer: [employer]  Tobacco Use   Smoking status: Former    Packs/day: 2.00    Years: 31.00    Pack years: 62.00    Types: Cigarettes  Start date: 1    Quit date: 10/23/1982    Years since quitting: 39.1   Smokeless tobacco: Never  Vaping Use   Vaping Use: Never used  Substance and Sexual Activity   Alcohol use: No    Alcohol/week: 0.0 standard drinks   Drug use: No   Sexual activity: Not on file  Other Topics Concern   Not on file  Social History Narrative   Lives with wife in a one story home.  Has a son and a daughter.     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     Education: 2 years of college.   Social Determinants of Health   Financial Resource Strain: Low Risk    Difficulty of Paying Living Expenses: Not hard at all  Food Insecurity: No Food Insecurity   Worried About Charity fundraiser in the Last Year: Never true   [place] in the Last Year: Never true  Transportation Needs: No Transportation Needs   Lack of Transportation (Medical): No   Lack of Transportation (Non-Medical): No  Physical Activity: Inactive    Days of Exercise per Week: 0 days   Minutes of Exercise per Session: 0 min  Stress: No Stress Concern Present   Feeling of Stress : Not at all  Social Connections: Moderately Integrated   Frequency of Communication with Friends and Family: More than three times a week   Frequency of Social Gatherings with Friends and Family: More than three times a week   Attends Religious Services: Never   Marine scientist or Organizations: Yes   Attends Music therapist: More than 4 times per year   Marital Status: Married  Human resources officer Violence: Not At Risk   Fear of Current or Ex-Partner: No   Emotionally Abused: No   Physically Abused: No   Sexually Abused: No    Outpatient Medications Prior to Visit  Medication Sig Dispense Refill   Accu-Chek FastClix Lancets MISC USE TO CHECK BLOOD SUGAR UP TO 6 TIMES DAILY 612 each 3   allopurinol (ZYLOPRIM) 300 MG tablet TAKE 1 TABLET BY MOUTH  DAILY 90 tablet 3   amitriptyline (ELAVIL) 50 MG tablet Take 0.5 tablets (25 mg total) by mouth at bedtime. 45 tablet 3   atropine 1 % ophthalmic solution Place 1 drop into the right eye 2 (two) times daily.     Blood Glucose Monitoring Suppl (ACCU-CHEK AVIVA PLUS) w/Device KIT Use as directed 1 kit 0   cetirizine (ZYRTEC) 10 MG tablet Take 10 mg by mouth daily as needed for allergies.     Continuous Blood Gluc Sensor (DEXCOM G6 SENSOR) MISC Inject 1 Device into the skin as directed. Apply to skin SQ every 10 days 9 each 3   diclofenac Sodium (VOLTAREN) 1 % GEL Apply 2 g topically 4 (four) times daily. 150 g 3   DULoxetine (CYMBALTA) 60 MG capsule Take 1 capsule (60 mg total) by mouth daily. 90 capsule 3   fluticasone (FLONASE) 50 MCG/ACT nasal spray Place 1 spray into the nose daily as needed for allergies.      folic acid (FOLVITE) 1 MG tablet Take 1 mg by mouth daily.     furosemide (LASIX) 20 MG tablet TAKE 1 TABLET BY MOUTH  DAILY 90 tablet 3   glucose blood (ACCU-CHEK AVIVA PLUS) test strip  USE TO TEST BLOOD SUGAR 6  TIMES PER DAY; E11.9 600 each 11   HUMALOG 100 UNIT/ML  injection INJECT SUBCUTANEOUSLY VIA  INSULIN PUMP TOTAL OF 120  UNITS PER DAY 110 mL 3   hypromellose (GENTEAL) 0.3 % GEL ophthalmic ointment      hypromellose (GENTEAL) 0.3 % GEL ophthalmic ointment      Insulin Infusion Pump (T: SLIM X2 INS PMP/CONTROL 7.4) DEVI by Does not apply route.     Insulin Infusion Pump Supplies (AUTOSOFT XC INFUSION SET) MISC Inject 1 Act into the skin every other day. Use with 56m cannula and 23 inch tubing. *5 boxes (90 day supply) 5 each 3   Insulin Infusion Pump Supplies (T:SLIM INSULIN CARTRIDGE 3ML) MISC Use with insulin pump, fill once every 2 days. 5 each 3   losartan (COZAAR) 25 MG tablet Take 12.5 mg by mouth daily.     moxifloxacin (VIGAMOX) 0.5 % ophthalmic solution Place 1 drop into the right eye 4 (four) times daily.     moxifloxacin (VIGAMOX) 0.5 % ophthalmic solution Place 1 drop into the right eye 4 times daily.     Multiple Vitamins-Minerals (CENTRUM SILVER PO) Take 1 tablet by mouth daily.     Omega-3 Fatty Acids (FISH OIL) 1200 MG CAPS Take 1,200 mg by mouth daily.     omeprazole (PRILOSEC) 20 MG capsule Take 1 capsule (20 mg total) by mouth daily as needed. 90 capsule 2   prednisoLONE acetate (PRED FORTE) 1 % ophthalmic suspension Place 1 drop into the right eye every 2 (two) hours.     rosuvastatin (CRESTOR) 40 MG tablet TAKE 1 TABLET BY MOUTH AT  BEDTIME 90 tablet 3   Semaglutide,0.25 or 0.5MG/DOS, (OZEMPIC, 0.25 OR 0.5 MG/DOSE,) 2 MG/1.5ML SOPN Inject 0.5 mg into the skin once a week. 4.5 mL 3   tetracaine (PONTOCAINE) 0.5 % ophthalmic solution      vitamin B-12 (CYANOCOBALAMIN) 1000 MCG tablet Take 1,000 mcg by mouth daily.     alfuzosin (UROXATRAL) 10 MG 24 hr tablet TAKE 1 TABLET BY MOUTH  DAILY WITH BREAKFAST 90 tablet 3   topiramate (TOPAMAX) 25 MG tablet Take 1 tablet (25 mg total) by mouth at bedtime. (Patient not taking: Reported on 11/29/2021) 30 tablet 3    No facility-administered medications prior to visit.    Allergies  Allergen Reactions   Lipitor [Atorvastatin] Other (See Comments)    unknown   Niacin     REACTION: Severe heartburn   Pioglitazone     REACTION: Edema    ROS Review of Systems  Constitutional: Negative.  Negative for diaphoresis, fatigue, fever and unexpected weight change.  HENT: Negative.    Eyes:  Positive for pain, redness and visual disturbance. Negative for discharge.  Respiratory: Negative.    Cardiovascular: Negative.   Gastrointestinal: Negative.   Neurological:  Positive for light-headedness. Negative for dizziness, facial asymmetry, speech difficulty, weakness, numbness and headaches.  Psychiatric/Behavioral: Negative.       Objective:    Physical Exam Vitals and nursing note reviewed.  Constitutional:      General: He is not in acute distress.    Appearance: Normal appearance. He is not ill-appearing, toxic-appearing or diaphoretic.  HENT:     Head: Normocephalic and atraumatic.     Right Ear: External ear normal.     Left Ear: External ear normal.  Eyes:     General: No scleral icterus.  Cardiovascular:     Rate and Rhythm: Normal rate and regular rhythm.  Pulmonary:     Effort: Pulmonary effort is normal.  Abdominal:     General:  Bowel sounds are normal.  Skin:    General: Skin is warm and dry.  Neurological:     Mental Status: He is alert and oriented to person, place, and time.  Psychiatric:        Mood and Affect: Mood normal.        Behavior: Behavior normal.    BP 134/66 (BP Location: Right Arm, Patient Position: Sitting, Cuff Size: Large)    Pulse 83    Temp (!) 97 F (36.1 C) (Temporal)    Ht _0  (1.803 m)    Wt 261 lb 3.2 oz (118.5 kg)    SpO2 97%    BMI 36.43 kg/m  Wt Readings from Last 3 Encounters:  11/29/21 261 lb 3.2 oz (118.5 kg)  11/23/21 263 lb 6.4 oz (119.5 kg)  11/22/21 263 lb (119.3 kg)     Health Maintenance Due  Topic Date Due   Zoster  Vaccines- Shingrix (1 of 2) Never done    There are no preventive care reminders to display for this patient.  Lab Results  Component Value Date   TSH 2.98 09/21/2020   Lab Results  Component Value Date   WBC 5.1 08/24/2021   HGB 14.3 08/24/2021   HCT 42.7 08/24/2021   MCV 91.5 08/24/2021   PLT 135.0 (L) 08/24/2021   Lab Results  Component Value Date   NA 141 08/24/2021   K 3.7 08/24/2021   CO2 29 08/24/2021   GLUCOSE 108 (H) 08/24/2021   BUN 17 08/24/2021   CREATININE 1.52 (H) 08/24/2021   BILITOT 0.7 02/28/2021   ALKPHOS 82 02/28/2021   AST 18 02/28/2021   ALT 20 02/28/2021   PROT 6.6 02/28/2021   ALBUMIN 4.0 02/28/2021   CALCIUM 9.0 08/24/2021   ANIONGAP 9 04/27/2021   GFR 43.21 (L) 08/24/2021   Lab Results  Component Value Date   CHOL 135 02/28/2021   Lab Results  Component Value Date   HDL 33.60 (L) 02/28/2021   Lab Results  Component Value Date   LDLCALC 66 02/28/2021   Lab Results  Component Value Date   TRIG 174.0 (H) 02/28/2021   Lab Results  Component Value Date   CHOLHDL 4 02/28/2021   Lab Results  Component Value Date   HGBA1C 6.8 (A) 11/23/2021      Assessment & Plan:   Problem List Items Addressed This Visit       Other   Eye pain, right - Primary   Relevant Medications   oxycodone-acetaminophen (PERCOCET) 2.5-325 MG tablet   Medication side effect    Meds ordered this encounter  Medications   oxycodone-acetaminophen (PERCOCET) 2.5-325 MG tablet    Sig: Take 1 tablet by mouth every 8 (eight) hours as needed for pain.    Dispense:  30 tablet    Refill:  0    Follow-up: Return in about 4 weeks (around 12/27/2021).  Suggested that they go ahead and try the Topamax. May not tolerate oxycodone either but will see. Advised patient to call opthalmology for further guidance.   Libby Maw, MD

## 2021-12-05 LAB — BASIC METABOLIC PANEL
BUN: 21 (ref 4–21)
Chloride: 101 (ref 99–108)
Creatinine: 1.8 — AB (ref <=1.3)
Glucose: 130
Potassium: 3.8 (ref 3.4–5.3)
Sodium: 141 (ref 137–147)

## 2021-12-05 LAB — CBC AND DIFFERENTIAL: Hemoglobin: 16.3 (ref 13.5–17.5)

## 2021-12-05 LAB — COMPREHENSIVE METABOLIC PANEL
Albumin: 4.3 (ref 3.5–5.0)
Calcium: 10 (ref 8.7–10.7)
eGFR: 37

## 2021-12-06 ENCOUNTER — Other Ambulatory Visit: Payer: Self-pay | Admitting: Family Medicine

## 2021-12-08 IMAGING — DX DG CHEST 2V
2 series · 2 of 2 positions shown · non-contrast
Comparison: 04/23/2021

CLINICAL DATA: 79-year-old male with a history of lung nodules

EXAM:
CHEST - 2 VIEW

[chest pa]
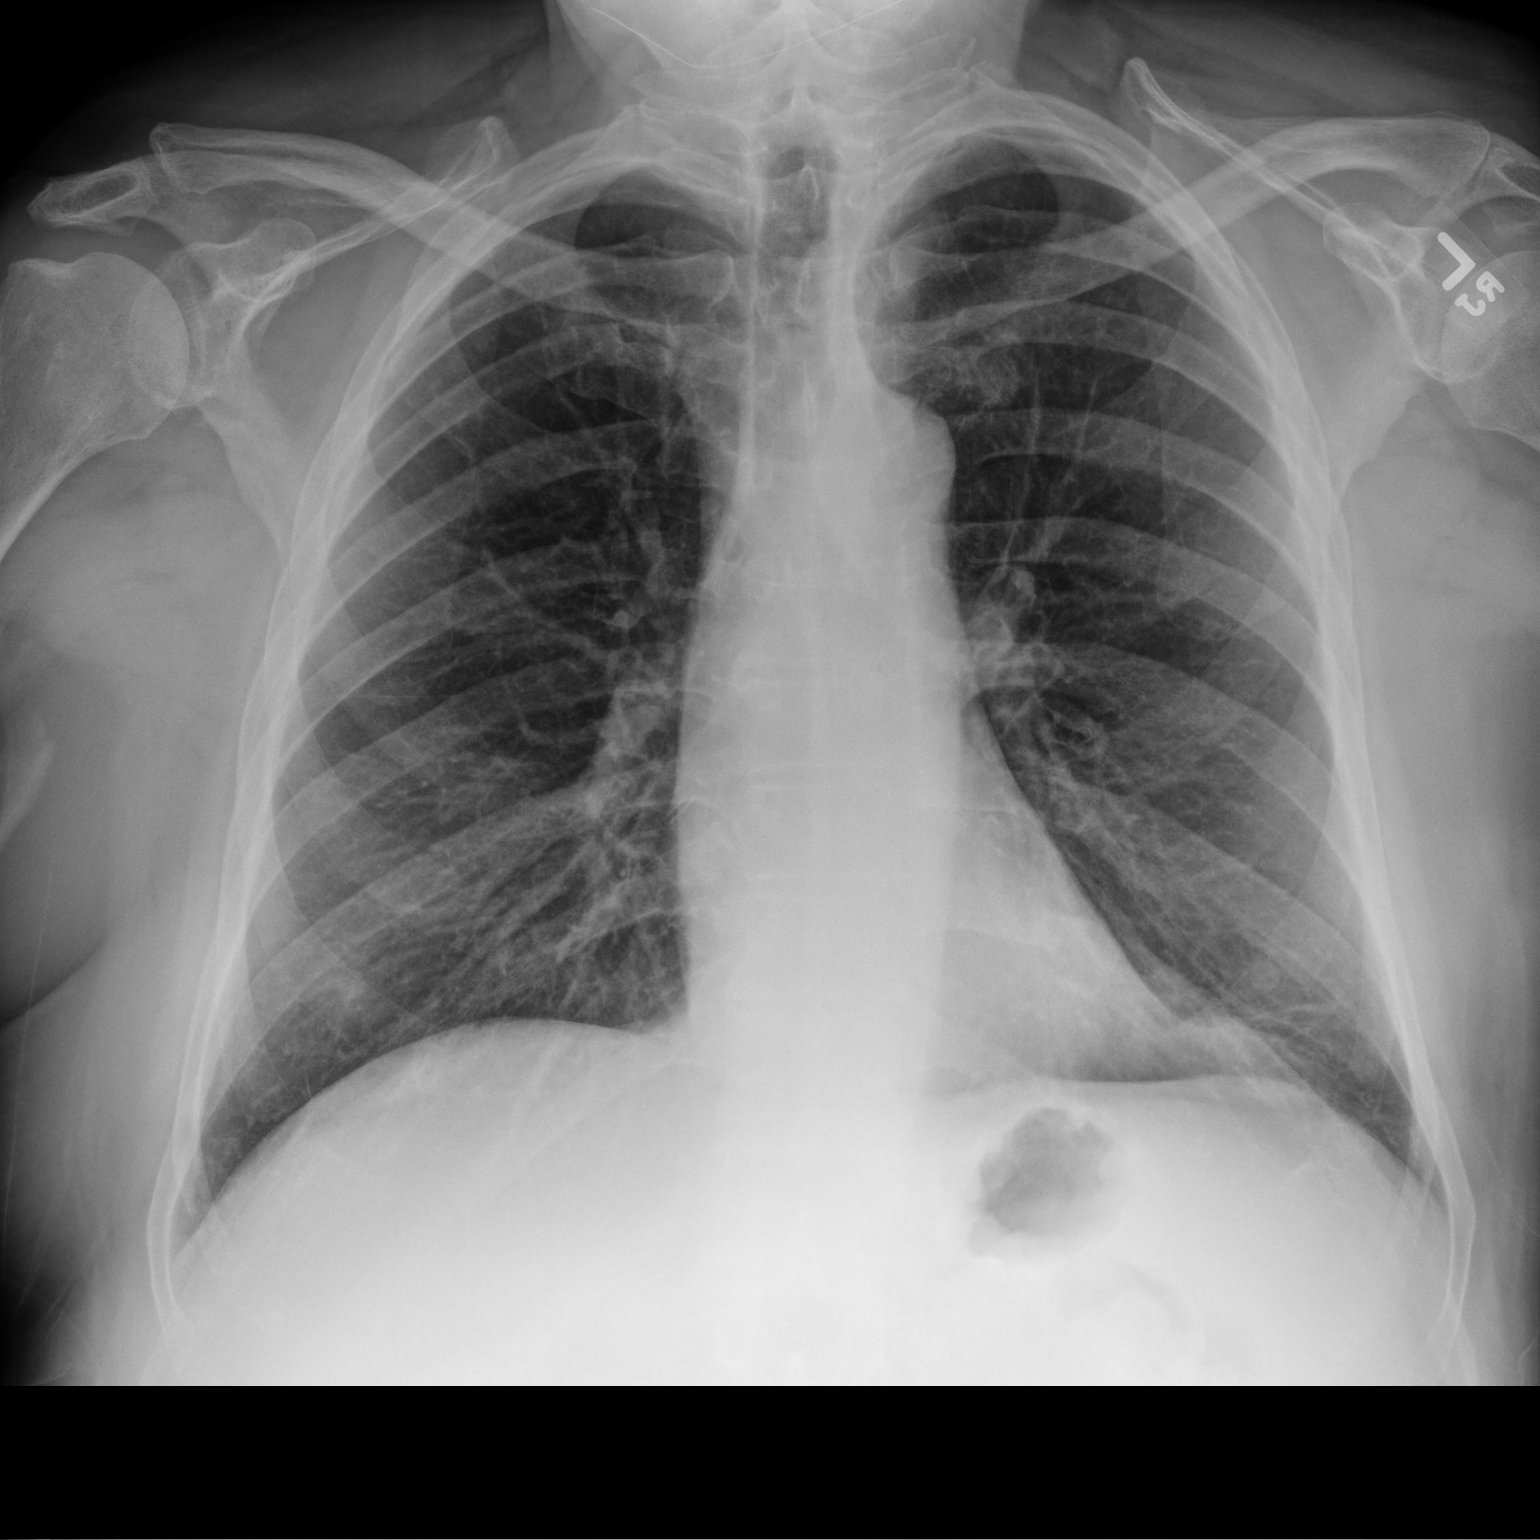

[chest lat]
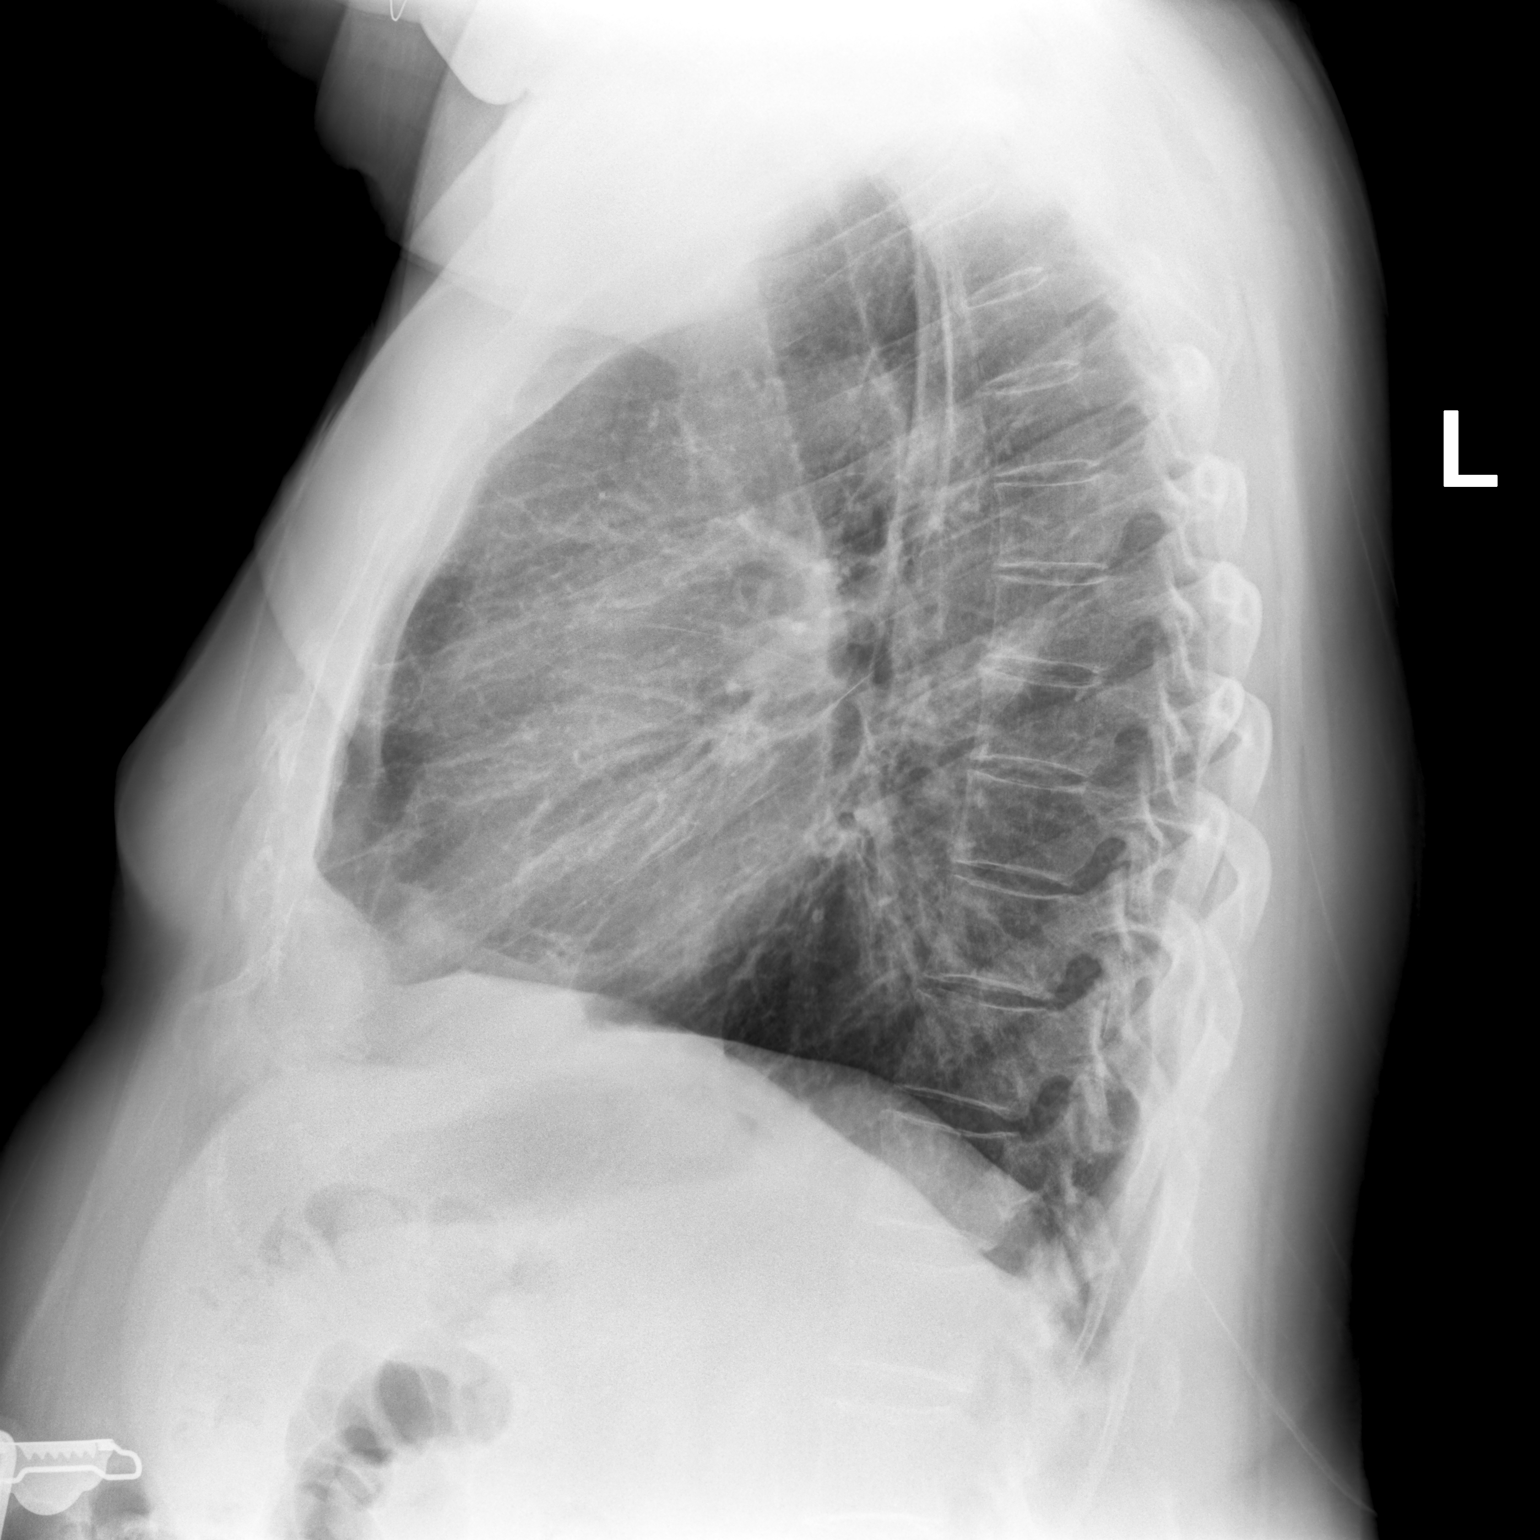

[2 of 2 positions shown; findings below may reference images not displayed]

FINDINGS: Cardiomediastinal silhouette unchanged in size and contour. No
evidence of central vascular congestion. No interlobular septal
thickening.

Nodular density at the left apex is favored to be related to the end
of the rib.

No pneumothorax or pleural effusion. Coarsened interstitial
markings, with no confluent airspace disease.

No acute displaced fracture. Degenerative changes of the spine.
IMPRESSION: Negative for acute cardiopulmonary disease.

No nodule identified on the current plain film, which is favored
previously to have been related to the rib

## 2021-12-14 ENCOUNTER — Encounter: Payer: Self-pay | Admitting: Endocrinology

## 2021-12-15 ENCOUNTER — Ambulatory Visit: Payer: Medicare Other | Admitting: Physician Assistant

## 2021-12-16 LAB — HM DIABETES EYE EXAM

## 2021-12-21 ENCOUNTER — Encounter: Payer: Self-pay | Admitting: Family Medicine

## 2021-12-23 ENCOUNTER — Other Ambulatory Visit: Payer: Self-pay | Admitting: Internal Medicine

## 2021-12-23 NOTE — Progress Notes (Signed)
Received notes from Dr. Royce Macadamia.  Creatinine now 1.84.  Now on 20 mg Twice per week. ?

## 2021-12-26 ENCOUNTER — Other Ambulatory Visit: Payer: Self-pay

## 2021-12-27 ENCOUNTER — Encounter: Payer: Self-pay | Admitting: Family Medicine

## 2021-12-27 ENCOUNTER — Ambulatory Visit: Payer: Medicare Other | Admitting: Family Medicine

## 2021-12-27 VITALS — BP 122/70 | HR 91 | Temp 98.6°F | Ht 71.0 in | Wt 262.0 lb

## 2021-12-27 DIAGNOSIS — R55 Syncope and collapse: Secondary | ICD-10-CM

## 2021-12-27 DIAGNOSIS — H5711 Ocular pain, right eye: Secondary | ICD-10-CM | POA: Diagnosis not present

## 2021-12-27 NOTE — Progress Notes (Signed)
Established Patient Office Visit  Subjective:  Patient ID: [name], male    DOB: 1941/02/25  Age: 81 y.o. MRN: [id number]  CC:  Chief Complaint  Patient presents with   Follow-up    4 week follow up, no concerns.     HPI [name] presents for follow-up of a near syncopal spells.  Fortunately pain is resolved.  There is no clear reason for his near syncopal/syncopal spells.  Denies associated palpitations status post negative cardiac work-up.  Has continuous glucose monitoring without low blood sugars associated with spells.  Denies complete loss of consciousness with the spells.  He is alert enough to feel himself falling but is unable to stop it.  There have been no associated convulsions, loss of bowel or bladder function, postictal state.  He is no longer taking the losartan.  Takes the Lasix twice weekly.  Taking 25 mg of Elavil at night.  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   Gynecomastia    Hepatic steatosis    HYPERLIPIDEMIA 05/20/2007   Hypertension    Hypogonadism male    Morbid obesity ([place]) 09/10/2009   OBSTRUCTIVE SLEEP APNEA 11/04/2007   Personal history of colonic polyps    RENAL INSUFFICIENCY 05/12/2008   Sleep apnea    uses cpap   Squamous cell carcinoma of skin 02/16/2020   left lower leg, anterior-cx3,76f   Urolithiasis     Past Surgical History:  Procedure Laterality Date   Abdominal UKorea 09/1997   arm fracture Left 1958   with hardware   Colon cancer screening     COLONOSCOPY  08/18/2004   diverticulitis   COLONOSCOPY  09/24/2009   ELECTROCARDIOGRAM  02/2006    FLEXIBLE SIGMOIDOSCOPY  03/04/2001   polyps, anal fissure   GANGLION CYST EXCISION Left 1980   HIP PINNING,CANNULATED Right 04/25/2021   Procedure: CANNULATED HIP PINNING;  Surgeon: SRod Can MD;  Location: [facility];  Service: Orthopedics;  Laterality: Right;   Lower Arterial  04/13/2004   POLYPECTOMY     Rest Cardiolite  03/19/2003    Family History  Problem Relation Age of Onset   Colon cancer Brother 540  Colon polyps Brother    Lung cancer Brother    Other Mother        MVA, deceased 568s  Healthy Daughter    Healthy Son    Rectal cancer Neg Hx    Stomach cancer Neg Hx     Social History   Socioeconomic History   Marital status: Married    Spouse name: Not on file   Number of children: Not on file   Years of education: Not on file   Highest education level: Not on file  Occupational History    Employer: [employer]  Tobacco Use   Smoking status: Former    Packs/day: 2.00    Years: 31.00    Pack years: 62.00    Types: Cigarettes    Start date: 1955    Quit date: 10/23/1982  Years since quitting: 39.2   Smokeless tobacco: Never  Vaping Use   Vaping Use: Never used  Substance and Sexual Activity   Alcohol use: No    Alcohol/week: 0.0 standard drinks   Drug use: No   Sexual activity: Not on file  Other Topics Concern   Not on file  Social History Narrative   Lives with wife in a one story home.  Has a son and a daughter.     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     Education: 2 years of college.   Social Determinants of Health   Financial Resource Strain: Low Risk    Difficulty of Paying Living Expenses: Not hard at all  Food Insecurity: No Food Insecurity   Worried About Charity fundraiser in the Last Year: Never true   [place] in the Last Year: Never true  Transportation Needs: No Transportation Needs   Lack of Transportation (Medical): No   Lack of Transportation (Non-Medical): No  Physical Activity: Inactive    Days of Exercise per Week: 0 days   Minutes of Exercise per Session: 0 min  Stress: No Stress Concern Present   Feeling of Stress : Not at all  Social Connections: Moderately Integrated   Frequency of Communication with Friends and Family: More than three times a week   Frequency of Social Gatherings with Friends and Family: More than three times a week   Attends Religious Services: Never   Marine scientist or Organizations: Yes   Attends Music therapist: More than 4 times per year   Marital Status: Married  Human resources officer Violence: Not At Risk   Fear of Current or Ex-Partner: No   Emotionally Abused: No   Physically Abused: No   Sexually Abused: No    Outpatient Medications Prior to Visit  Medication Sig Dispense Refill   Accu-Chek FastClix Lancets MISC USE TO CHECK BLOOD SUGAR UP TO 6 TIMES DAILY 612 each 3   alfuzosin (UROXATRAL) 10 MG 24 hr tablet TAKE 1 TABLET BY MOUTH  DAILY WITH BREAKFAST 90 tablet 3   allopurinol (ZYLOPRIM) 300 MG tablet TAKE 1 TABLET BY MOUTH  DAILY 90 tablet 3   amitriptyline (ELAVIL) 50 MG tablet Take 0.5 tablets (25 mg total) by mouth at bedtime. 45 tablet 3   atropine 1 % ophthalmic solution Place 1 drop into the right eye 2 (two) times daily.     Blood Glucose Monitoring Suppl (ACCU-CHEK AVIVA PLUS) w/Device KIT Use as directed 1 kit 0   cetirizine (ZYRTEC) 10 MG tablet Take 10 mg by mouth daily as needed for allergies.     Continuous Blood Gluc Sensor (DEXCOM G6 SENSOR) MISC Inject 1 Device into the skin as directed. Apply to skin SQ every 10 days 9 each 3   diclofenac Sodium (VOLTAREN) 1 % GEL Apply 2 g topically 4 (four) times daily. 150 g 3   DULoxetine (CYMBALTA) 60 MG capsule Take 1 capsule (60 mg total) by mouth daily. 90 capsule 3   fluticasone (FLONASE) 50 MCG/ACT nasal spray Place 1 spray into the nose daily as needed for allergies.      folic acid (FOLVITE) 1 MG tablet Take 1 mg by mouth daily.     furosemide (LASIX) 20  MG tablet TAKE 1 TABLET BY MOUTH  DAILY 90 tablet 3   glucose blood (ACCU-CHEK AVIVA PLUS) test strip USE TO TEST BLOOD SUGAR 6  TIMES PER DAY;  E11.9 600 each 11   HUMALOG 100 UNIT/ML injection INJECT SUBCUTANEOUSLY VIA  INSULIN PUMP TOTAL OF 120  UNITS PER DAY 110 mL 3   Insulin Infusion Pump Supplies (AUTOSOFT XC INFUSION SET) MISC Inject 1 Act into the skin every other day. Use with 25m cannula and 23 inch tubing. *5 boxes (90 day supply) 5 each 3   Insulin Infusion Pump Supplies (T:SLIM INSULIN CARTRIDGE 3ML) MISC Use with insulin pump, fill once every 2 days. 5 each 3   losartan (COZAAR) 25 MG tablet Take 12.5 mg by mouth daily.     Multiple Vitamins-Minerals (CENTRUM SILVER PO) Take 1 tablet by mouth daily.     Omega-3 Fatty Acids (FISH OIL) 1200 MG CAPS Take 1,200 mg by mouth daily.     omeprazole (PRILOSEC) 20 MG capsule Take 1 capsule (20 mg total) by mouth daily as needed. 90 capsule 2   rosuvastatin (CRESTOR) 40 MG tablet TAKE 1 TABLET BY MOUTH AT  BEDTIME 90 tablet 3   Semaglutide,0.25 or 0.5MG/DOS, (OZEMPIC, 0.25 OR 0.5 MG/DOSE,) 2 MG/1.5ML SOPN Inject 0.5 mg into the skin once a week. 4.5 mL 3   vitamin B-12 (CYANOCOBALAMIN) 1000 MCG tablet Take 1,000 mcg by mouth daily.     tetracaine (PONTOCAINE) 0.5 % ophthalmic solution      hypromellose (GENTEAL) 0.3 % GEL ophthalmic ointment      hypromellose (GENTEAL) 0.3 % GEL ophthalmic ointment      Insulin Infusion Pump (T: SLIM X2 INS PMP/CONTROL 7.4) DEVI by Does not apply route.     moxifloxacin (VIGAMOX) 0.5 % ophthalmic solution Place 1 drop into the right eye 4 (four) times daily.     moxifloxacin (VIGAMOX) 0.5 % ophthalmic solution Place 1 drop into the right eye 4 times daily.     oxycodone-acetaminophen (PERCOCET) 2.5-325 MG tablet Take 1 tablet by mouth every 8 (eight) hours as needed for pain. 30 tablet 0   prednisoLONE acetate (PRED FORTE) 1 % ophthalmic suspension Place 1 drop into the right eye every 2 (two) hours.      topiramate (TOPAMAX) 25 MG tablet Take 1 tablet (25 mg total) by mouth at bedtime. (Patient not taking: Reported on 11/29/2021) 30 tablet 3   No facility-administered medications prior to visit.    Allergies  Allergen Reactions   Lipitor [Atorvastatin] Other (See Comments)    unknown   Niacin     REACTION: Severe heartburn   Pioglitazone     REACTION: Edema    ROS Review of Systems  Constitutional:  Negative for chills, diaphoresis, fatigue, fever and unexpected weight change.  HENT: Negative.    Eyes:  Negative for photophobia and visual disturbance.  Respiratory: Negative.  Negative for choking, shortness of breath and wheezing.   Cardiovascular: Negative.  Negative for chest pain and palpitations.  Gastrointestinal: Negative.  Negative for abdominal pain, diarrhea and vomiting.  Endocrine: Negative for polyphagia and polyuria.  Genitourinary: Negative.   Skin:  Negative for pallor and rash.  Neurological:  Positive for light-headedness. Negative for dizziness, speech difficulty and weakness.  Psychiatric/Behavioral: Negative.       Objective:    Physical Exam  BP 122/70 (BP Location: Right Arm, Patient Position: Sitting, Cuff Size: Large)    Pulse 91    Temp 98.6 F (37 C) (Temporal)    Ht '5\' 11"'  (1.803 m)    Wt 262 lb (118.8 kg)    SpO2 95%    BMI 36.54 kg/m  Wt Readings from Last  3 Encounters:  12/27/21 262 lb (118.8 kg)  11/29/21 261 lb 3.2 oz (118.5 kg)  11/23/21 263 lb 6.4 oz (119.5 kg)     Health Maintenance Due  Topic Date Due   Zoster Vaccines- Shingrix (2 of 2) 10/06/2021    There are no preventive care reminders to display for this patient.  Lab Results  Component Value Date   TSH 2.98 09/21/2020   Lab Results  Component Value Date   WBC 5.1 08/24/2021   HGB 16.3 12/05/2021   HCT 42.7 08/24/2021   MCV 91.5 08/24/2021   PLT 135.0 (L) 08/24/2021   Lab Results  Component Value Date   NA 141 12/05/2021   K 3.8 12/05/2021   CO2 29 08/24/2021    GLUCOSE 108 (H) 08/24/2021   BUN 21 12/05/2021   CREATININE 1.8 (A) 12/05/2021   BILITOT 0.7 02/28/2021   ALKPHOS 82 02/28/2021   AST 18 02/28/2021   ALT 20 02/28/2021   PROT 6.6 02/28/2021   ALBUMIN 4.3 12/05/2021   CALCIUM 10.0 12/05/2021   ANIONGAP 9 04/27/2021   EGFR 37 12/05/2021   GFR 43.21 (L) 08/24/2021   Lab Results  Component Value Date   CHOL 135 02/28/2021   Lab Results  Component Value Date   HDL 33.60 (L) 02/28/2021   Lab Results  Component Value Date   LDLCALC 66 02/28/2021   Lab Results  Component Value Date   TRIG 174.0 (H) 02/28/2021   Lab Results  Component Value Date   CHOLHDL 4 02/28/2021   Lab Results  Component Value Date   HGBA1C 6.8 (A) 11/23/2021      Assessment & Plan:   Problem List Items Addressed This Visit       Other   Eye pain, right - Primary   Other Visit Diagnoses     Near syncope           No orders of the defined types were placed in this encounter.   Follow-up: Return in about 3 months (around 03/29/2022).  Consider holding Elavil.  May consider Florinef.  Continue follow-up with eye doctors.  Libby Maw, MD

## 2022-01-10 ENCOUNTER — Ambulatory Visit (INDEPENDENT_AMBULATORY_CARE_PROVIDER_SITE_OTHER): Payer: Medicare Other

## 2022-01-10 DIAGNOSIS — Z0000 Encounter for general adult medical examination without abnormal findings: Secondary | ICD-10-CM | POA: Diagnosis not present

## 2022-01-10 NOTE — Progress Notes (Signed)
? ?Subjective:  ? [name] is a 81 y.o. male who presents for an Subsequent Medicare Annual Wellness Visit. ? ?Review of Systems    ? ?Cardiac Risk Factors include: advanced age (>8mn, >>43women);diabetes mellitus;dyslipidemia;male gender;hypertension ? ?   ?Objective:  ?  ?Today's Vitals  ? ?There is no height or weight on file to calculate BMI. ? ?Advanced Directives 01/10/2022 04/23/2021 04/23/2021 01/04/2021 12/31/2019 05/05/2019 07/03/2017  ?Does Patient Have a Medical Advance Directive? Yes No No Yes No No No  ?Type of [place] - - Living will;Healthcare Power of Attorney - - -  ?Does patient want to make changes to medical advance directive? - No - Patient declined - - - - -  ?Copy of [place]? No - copy requested - - No - copy requested - - -  ?Would patient like information on creating a medical advance directive? - No - Patient declined - - No - Patient declined No - Patient declined -  ? ? ?Current Medications (verified) ?Outpatient Encounter Medications as of 01/10/2022  ?Medication Sig  ? Accu-Chek FastClix Lancets MISC USE TO CHECK BLOOD SUGAR UP TO 6 TIMES DAILY  ? alfuzosin (UROXATRAL) 10 MG 24 hr tablet TAKE 1 TABLET BY MOUTH  DAILY WITH BREAKFAST  ? allopurinol (ZYLOPRIM) 300 MG tablet TAKE 1 TABLET BY MOUTH  DAILY  ? amitriptyline (ELAVIL) 50 MG tablet Take 0.5 tablets (25 mg total) by mouth at bedtime.  ? atropine 1 % ophthalmic solution Place 1 drop into the right eye 2 (two) times daily.  ? Blood Glucose Monitoring Suppl (ACCU-CHEK AVIVA PLUS) w/Device KIT Use as directed  ? cetirizine (ZYRTEC) 10 MG tablet Take 10 mg by mouth daily as needed for allergies.  ? Continuous Blood Gluc Sensor (DEXCOM G6 SENSOR) MISC Inject 1 Device into the skin as directed. Apply to skin SQ every 10 days  ? diclofenac Sodium (VOLTAREN) 1 % GEL Apply 2 g topically 4 (four) times daily.  ? DULoxetine (CYMBALTA) 60 MG capsule Take 1 capsule (60  mg total) by mouth daily.  ? fluticasone (FLONASE) 50 MCG/ACT nasal spray Place 1 spray into the nose daily as needed for allergies.   ? folic acid (FOLVITE) 1 MG tablet Take 1 mg by mouth daily.  ? furosemide (LASIX) 20 MG tablet TAKE 1 TABLET BY MOUTH  DAILY  ? glucose blood (ACCU-CHEK AVIVA PLUS) test strip USE TO TEST BLOOD SUGAR 6  TIMES PER DAY; E11.9  ? HUMALOG 100 UNIT/ML injection INJECT SUBCUTANEOUSLY VIA  INSULIN PUMP TOTAL OF 120  UNITS PER DAY  ? Insulin Infusion Pump Supplies (AUTOSOFT XC INFUSION SET) MISC Inject 1 Act into the skin every other day. Use with 948mcannula and 23 inch tubing. *5 boxes (90 day supply)  ? Insulin Infusion Pump Supplies (T:SLIM INSULIN CARTRIDGE 3ML) MISC Use with insulin pump, fill once every 2 days.  ? losartan (COZAAR) 25 MG tablet Take 12.5 mg by mouth daily.  ? Multiple Vitamins-Minerals (CENTRUM SILVER PO) Take 1 tablet by mouth daily.  ? Omega-3 Fatty Acids (FISH OIL) 1200 MG CAPS Take 1,200 mg by mouth daily.  ? omeprazole (PRILOSEC) 20 MG capsule Take 1 capsule (20 mg total) by mouth daily as needed.  ? rosuvastatin (CRESTOR) 40 MG tablet TAKE 1 TABLET BY MOUTH AT  BEDTIME  ? Semaglutide,0.25 or 0.5MG/DOS, (OZEMPIC, 0.25 OR 0.5 MG/DOSE,) 2 MG/1.5ML SOPN Inject 0.5 mg into the skin once a  week.  ? vitamin B-12 (CYANOCOBALAMIN) 1000 MCG tablet Take 1,000 mcg by mouth daily.  ? ?No facility-administered encounter medications on file as of 01/10/2022.  ? ? ?Allergies (verified) ?Lipitor [atorvastatin], Niacin, and Pioglitazone  ? ?History: ?Past Medical History:  ?Diagnosis Date  ? Allergy   ? Anal fissure   ? Anemia, unspecified   ? Arthritis   ? Spinal OA  ? BACK PAIN, LUMBAR 10/23/2007  ? CARDIAC MURMUR, SYSTOLIC 04/27/7340  ? DIABETES MELLITUS, TYPE I 05/20/2007  ? DIASTOLIC DYSFUNCTION 9/37/9024  ? 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 Duodenitis   ? Edema 05/12/2008  ? Esophageal stricture   ? GERD (gastroesophageal reflux disease)   ? 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   ? Gynecomastia   ? Hepatic steatosis   ? HYPERLIPIDEMIA 05/20/2007  ? Hypertension   ? Hypogonadism male   ? Morbid obesity ([place]) 09/10/2009  ? OBSTRUCTIVE SLEEP APNEA 11/04/2007  ? Personal history of colonic polyps   ? RENAL INSUFFICIENCY 05/12/2008  ? Sleep apnea   ? uses cpap  ? Squamous cell carcinoma of skin 02/16/2020  ? left lower leg, anterior-cx3,83f  ? Urolithiasis   ? ?Past Surgical History:  ?Procedure Laterality Date  ? 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 arm fracture Left 1958  ? with hardware  ? Colon cancer screening    ? COLONOSCOPY  08/18/2004  ? diverticulitis  ? COLONOSCOPY  09/24/2009  ? ELECTROCARDIOGRAM  02/2006  ? FLEXIBLE SIGMOIDOSCOPY  03/04/2001  ? polyps, anal fissure  ? GANGLION CYST EXCISION Left 1980  ? HIP PINNING,CANNULATED Right 04/25/2021  ? Procedure: CANNULATED HIP PINNING;  Surgeon: SRod Can MD;  Location: [facility];  Service: Orthopedics;  Laterality: Right;  ? Lower Arterial  04/13/2004  ? POLYPECTOMY    ? Rest Cardiolite  03/19/2003  ? ?Family History  ?Problem Relation Age of Onset  ? Colon cancer Brother 523 ? Colon polyps Brother   ? Lung cancer Brother   ? Other Mother   ?     MVA, deceased 530s ? Healthy Daughter   ? Healthy Son   ? Rectal cancer Neg Hx   ? Stomach cancer Neg Hx   ? ?Social History  ? ?Socioeconomic History  ? Marital status: Married  ?  Spouse name: Not on file  ? Number of children: Not on file  ? Years of education: Not on file  ? Highest education level: Not on file  ?Occupational History  ?  Employer: [employer]  ?Tobacco Use  ? Smoking status: Former  ?  Packs/day: 2.00  ?  Years: 31.00  ?  Pack years: 62.00  ?  Types: Cigarettes  ?  Start date: 89 ?  Quit date: 10/23/1982  ?  Years since quitting: 39.2  ? Smokeless tobacco: Never  ?Vaping Use  ? Vaping Use: Never used  ?Substance and Sexual Activity  ? Alcohol use: No  ?  Alcohol/week: 0.0 standard drinks  ? Drug use: No  ? Sexual activity: Not on file  ?Other Topics Concern  ? Not on file  ?Social  History Narrative  ? Lives with wife in a one story home.  Has a son and a daughter.    ? 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 Education: 2 years of college.  ? ?Social Determinants of Health  ? ?Financial Resource Strain: Low Risk   ? Difficulty of Paying Living Expenses: Not hard  at all  ?Food Insecurity: No Food Insecurity  ? Worried About Charity fundraiser in the Last Year: Never true  ? Ran Out of Food in the Last Year: Never true  ?Transportation Needs: No Transportation Needs  ? Lack of Transportation (Medical): No  ? Lack of Transportation (Non-Medical): No  ?Physical Activity: Inactive  ? Days of Exercise per Week: 0 days  ? Minutes of Exercise per Session: 0 min  ?Stress: No Stress Concern Present  ? Feeling of Stress : Not at all  ?Social Connections: Socially Integrated  ? Frequency of Communication with Friends and Family: Twice a week  ? Frequency of Social Gatherings with Friends and Family: Twice a week  ? Attends Religious Services: 1 to 4 times per year  ? Active Member of Clubs or Organizations: Yes  ? Attends Archivist Meetings: 1 to 4 times per year  ? Marital Status: Married  ? ? ?Tobacco Counseling ?Counseling given: Not Answered ? ? ?Clinical Intake: ? ?Pre-visit preparation completed: Yes ? ?Pain : No/denies pain ? ?  ? ?Nutritional Risks: None ?Diabetes: No ? ?How often do you need to have someone help you when you read instructions, pamphlets, or other written materials from your doctor or pharmacy?: 1 - Never ?What is the last grade level you completed in school?: college ? ?Diabetic?yes ?Nutrition Risk Assessment: ? ?Has the patient had any N/V/D within the last 2 months?  No  ?Does the patient have any non-healing wounds?  No  ?Has the patient had any unintentional weight loss or weight gain?  No  ? ?Diabetes: ? ?Is the patient diabetic?  Yes  ?If diabetic, was a CBG obtained today?  No  ?Did the patient bring in their glucometer from home?  No   ?How often do you monitor your CBG's? 3 x day .  ? ?Financial Strains and Diabetes Management: ? ?Are you having any financial strains with the device, your supplies or your medication? No .  ?Does the patie

## 2022-01-10 NOTE — Patient Instructions (Signed)
[name] , ?Thank you for taking time to come for your Medicare Wellness Visit. I appreciate your ongoing commitment to your health goals. Please review the following plan we discussed and let me know if I can assist you in the future.  ? ?Screening recommendations/referrals: ?Colonoscopy: no longer required  ?Recommended yearly ophthalmology/optometry visit for glaucoma screening and checkup ?Recommended yearly dental visit for hygiene and checkup ? ?Vaccinations: ?Influenza vaccine: completed  ?Pneumococcal vaccine: completed  ?Tdap vaccine: 10/30/2021 ?Shingles vaccine: completed    ? ?Advanced directives: yes  ? ?Conditions/risks identified: none  ? ?Next appointment: none  ? ?Preventive Care 81 Years and Older, Male ?Preventive care refers to lifestyle choices and visits with your health care provider that can promote health and wellness. ?What does preventive care include? ?A yearly physical exam. This is also called an annual well check. ?Dental exams once or twice a year. ?Routine eye exams. Ask your health care provider how often you should have your eyes checked. ?Personal lifestyle choices, including: ?Daily care of your teeth and gums. ?Regular physical activity. ?Eating a healthy diet. ?Avoiding tobacco and drug use. ?Limiting alcohol use. ?Practicing safe sex. ?Taking low doses of aspirin every day. ?Taking vitamin and mineral supplements as recommended by your health care provider. ?What happens during an annual well check? ?The services and screenings done by your health care provider during your annual well check will depend on your age, overall health, lifestyle risk factors, and family history of disease. ?Counseling  ?Your health care provider may ask you questions about your: ?Alcohol use. ?Tobacco use. ?Drug use. ?Emotional well-being. ?Home and relationship well-being. ?Sexual activity. ?Eating habits. ?History of falls. ?Memory and ability to understand (cognition). ?Work and work  Statistician. ?Screening  ?You may have the following tests or measurements: ?Height, weight, and BMI. ?Blood pressure. ?Lipid and cholesterol levels. These may be checked every 5 years, or more frequently if you are over 73 years old. ?Skin check. ?Lung cancer screening. You may have this screening every year starting at age 38 if you have a 30-pack-year history of smoking and currently smoke or have quit within the past 15 years. ?Fecal occult blood test (FOBT) of the stool. You may have this test every year starting at age 16. ?Flexible sigmoidoscopy or colonoscopy. You may have a sigmoidoscopy every 5 years or a colonoscopy every 10 years starting at age 50. ?Prostate cancer screening. Recommendations will vary depending on your family history and other risks. ?Hepatitis C blood test. ?Hepatitis B blood test. ?Sexually transmitted disease (STD) testing. ?Diabetes screening. This is done by checking your blood sugar (glucose) after you have not eaten for a while (fasting). You may have this done every 1-3 years. ?Abdominal aortic aneurysm (AAA) screening. You may need this if you are a current or former smoker. ?Osteoporosis. You may be screened starting at age 64 if you are at high risk. ?Talk with your health care provider about your test results, treatment options, and if necessary, the need for more tests. ?Vaccines  ?Your health care provider may recommend certain vaccines, such as: ?Influenza vaccine. This is recommended every year. ?Tetanus, diphtheria, and acellular pertussis (Tdap, Td) vaccine. You may need a Td booster every 10 years. ?Zoster vaccine. You may need this after age 43. ?Pneumococcal 13-valent conjugate (PCV13) vaccine. One dose is recommended after age 57. ?Pneumococcal polysaccharide (PPSV23) vaccine. One dose is recommended after age 75. ?Talk to your health care provider about which screenings and vaccines you need and how  often you need them. ?This information is not intended to replace  advice given to you by your health care provider. Make sure you discuss any questions you have with your health care provider. ?Document Released: 11/05/2015 Document Revised: 06/28/2016 Document Reviewed: 08/10/2015 ?Elsevier Interactive Patient Education ? 2017 [place]. ? ?Fall Prevention in the Home ?Falls can cause injuries. They can happen to people of all ages. There are many things you can do to make your home safe and to help prevent falls. ?What can I do on the outside of my home? ?Regularly fix the edges of walkways and driveways and fix any cracks. ?Remove anything that might make you trip as you walk through a door, such as a raised step or threshold. ?Trim any bushes or trees on the path to your home. ?Use bright outdoor lighting. ?Clear any walking paths of anything that might make someone trip, such as rocks or tools. ?Regularly check to see if handrails are loose or broken. Make sure that both sides of any steps have handrails. ?Any raised decks and porches should have guardrails on the edges. ?Have any leaves, snow, or ice cleared regularly. ?Use sand or salt on walking paths during winter. ?Clean up any spills in your garage right away. This includes oil or grease spills. ?What can I do in the bathroom? ?Use night lights. ?Install grab bars by the toilet and in the tub and shower. Do not use towel bars as grab bars. ?Use non-skid mats or decals in the tub or shower. ?If you need to sit down in the shower, use a plastic, non-slip stool. ?Keep the floor dry. Clean up any water that spills on the floor as soon as it happens. ?Remove soap buildup in the tub or shower regularly. ?Attach bath mats securely with double-sided non-slip rug tape. ?Do not have throw rugs and other things on the floor that can make you trip. ?What can I do in the bedroom? ?Use night lights. ?Make sure that you have a light by your bed that is easy to reach. ?Do not use any sheets or blankets that are too big for your bed.  They should not hang down onto the floor. ?Have a firm chair that has side arms. You can use this for support while you get dressed. ?Do not have throw rugs and other things on the floor that can make you trip. ?What can I do in the kitchen? ?Clean up any spills right away. ?Avoid walking on wet floors. ?Keep items that you use a lot in easy-to-reach places. ?If you need to reach something above you, use a strong step stool that has a grab bar. ?Keep electrical cords out of the way. ?Do not use floor polish or wax that makes floors slippery. If you must use wax, use non-skid floor wax. ?Do not have throw rugs and other things on the floor that can make you trip. ?What can I do with my stairs? ?Do not leave any items on the stairs. ?Make sure that there are handrails on both sides of the stairs and use them. Fix handrails that are broken or loose. Make sure that handrails are as long as the stairways. ?Check any carpeting to make sure that it is firmly attached to the stairs. Fix any carpet that is loose or worn. ?Avoid having throw rugs at the top or bottom of the stairs. If you do have throw rugs, attach them to the floor with carpet tape. ?Make sure that you have a  light switch at the top of the stairs and the bottom of the stairs. If you do not have them, ask someone to add them for you. ?What else can I do to help prevent falls? ?Wear shoes that: ?Do not have high heels. ?Have rubber bottoms. ?Are comfortable and fit you well. ?Are closed at the toe. Do not wear sandals. ?If you use a stepladder: ?Make sure that it is fully opened. Do not climb a closed stepladder. ?Make sure that both sides of the stepladder are locked into place. ?Ask someone to hold it for you, if possible. ?Clearly mark and make sure that you can see: ?Any grab bars or handrails. ?First and last steps. ?Where the edge of each step is. ?Use tools that help you move around (mobility aids) if they are needed. These  include: ?Canes. ?Walkers. ?Scooters. ?Crutches. ?Turn on the lights when you go into a dark area. Replace any light bulbs as soon as they burn out. ?Set up your furniture so you have a clear path. Avoid moving your furniture around. ?If

## 2022-02-16 ENCOUNTER — Encounter
Payer: Medicare Other | Attending: Physical Medicine and Rehabilitation | Admitting: Physical Medicine and Rehabilitation

## 2022-02-16 VITALS — BP 120/74 | HR 94 | Ht 71.0 in | Wt 264.0 lb

## 2022-02-16 DIAGNOSIS — E1142 Type 2 diabetes mellitus with diabetic polyneuropathy: Secondary | ICD-10-CM | POA: Diagnosis present

## 2022-02-16 DIAGNOSIS — M47819 Spondylosis without myelopathy or radiculopathy, site unspecified: Secondary | ICD-10-CM | POA: Diagnosis present

## 2022-02-16 NOTE — Progress Notes (Signed)
? ?Subjective:  ? ? Patient ID: [name], male    DOB: [birth year], 81 y.o.   MRN: [id number]  ? ?HPI  ?Male with pmh of OSA, CKD, HTN, DM type 1 who presents for follow-up of diabetic peripheral neuropathy presents for follow-up of low back pain > neck pain.   ?On first visit, pt complained of b/l L>R back pain.  Started ~>20 years ago.  No inciting event.  Sitting/laying improve the pain.  Standing exacerbates the pain.  Sharp, burning pain.  Radiates to left posterior thigh.  Intermittent.  He has associated numbness/tingling.  He only tried ASA, which helps some.  Pain limits from doing ADLs and fishing.  He denies falls. ? ?Last clinic visit on 10/21/2020.  He had trigger point injections at that time.  Since that time, patient states he had great benefit for about a week.  He uses a TENS with benefit. Patient states he feels he is getting weaker. He is taking Baclofen 1-2/week.  Sleep has improved. He is taking Elavil 25.  He states he believe he lost weight, but not exercising. Denies falls. Lightheadedness has improved.  ?  ?He does not take any other medications for his pain and does not desire to. He requires a cane for ambulation and has a handicap placard.  ? ?Blood pressure is better controlled today at 120/74 ? ?He has been to multiple speciialists and was told he will not be able to see from this eye after her had a fall on this eye ?Has not been using SPRINT PNS but it did help when he used it.  ?Has been having headaches.  ?-he is ready to try Qutenza today ?-he has numbness and tingling from his shins down to his feet and it is constant and feels uncomfortable ?-his back pain has also been severe ?-wife accompanies him today ?-he has decreased sensation in both feet ? ? ?Pain Inventory ?Average Pain 5 ?Pain Right Now 8 ?My pain is intermittent, sharp, burning, stabbing and tingling ? ?In the last 24 hours, has pain interfered with the following? ?General activity 4 ?Relation with others  9 ?Enjoyment of life 9 ?What TIME of day is your pain at its worst?  all ?Sleep (in general) Good ? ?Pain is worse with: walking and standing ?Pain improves with: rest and TENS ?Relief from Meds:  n/a' ? ? ? ? ?Family History  ?Problem Relation Age of Onset  ? Colon cancer Brother 77  ? Colon polyps Brother   ? Lung cancer Brother   ? Other Mother   ?     MVA, deceased 31s  ? Healthy Daughter   ? Healthy Son   ? Rectal cancer Neg Hx   ? Stomach cancer Neg Hx   ? ?Social History  ? ?Socioeconomic History  ? Marital status: Married  ?  Spouse name: Not on file  ? Number of children: Not on file  ? Years of education: Not on file  ? Highest education level: Not on file  ?Occupational History  ?  Employer: [employer]  ?Tobacco Use  ? Smoking status: Former  ?  Packs/day: 2.00  ?  Years: 31.00  ?  Pack years: 62.00  ?  Types: Cigarettes  ?  Start date: 69  ?  Quit date: 10/23/1982  ?  Years since quitting: 39.3  ? Smokeless tobacco: Never  ?Vaping Use  ? Vaping Use: Never used  ?Substance and Sexual Activity  ? Alcohol use: No  ?  Alcohol/week: 0.0 standard drinks  ? Drug use: No  ? Sexual activity: Not on file  ?Other Topics Concern  ? Not on file  ?Social History Narrative  ? Lives with wife in a one story home.  Has a son and a daughter.    ? 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    ? Education: 2 years of college.  ? ?Social Determinants of Health  ? ?Financial Resource Strain: Low Risk   ? Difficulty of Paying Living Expenses: Not hard at all  ?Food Insecurity: No Food Insecurity  ? Worried About Charity fundraiser in the Last Year: Never true  ? Ran Out of Food in the Last Year: Never true  ?Transportation Needs: No Transportation Needs  ? Lack of Transportation (Medical): No  ? Lack of Transportation (Non-Medical): No  ?Physical Activity: Inactive  ? Days of Exercise per Week: 0 days  ? Minutes of Exercise per Session: 0 min  ?Stress: No Stress Concern Present  ? Feeling of Stress : Not  at all  ?Social Connections: Socially Integrated  ? Frequency of Communication with Friends and Family: Twice a week  ? Frequency of Social Gatherings with Friends and Family: Twice a week  ? Attends Religious Services: 1 to 4 times per year  ? Active Member of Clubs or Organizations: Yes  ? Attends Archivist Meetings: 1 to 4 times per year  ? Marital Status: Married  ? ?Past Surgical History:  ?Procedure Laterality Date  ? Abdominal US  09/1997  ? arm fracture Left 1958  ? with hardware  ? Colon cancer screening    ? COLONOSCOPY  08/18/2004  ? diverticulitis  ? COLONOSCOPY  09/24/2009  ? ELECTROCARDIOGRAM  02/2006  ? FLEXIBLE SIGMOIDOSCOPY  03/04/2001  ? polyps, anal fissure  ? GANGLION CYST EXCISION Left 1980  ? HIP PINNING,CANNULATED Right 04/25/2021  ? Procedure: CANNULATED HIP PINNING;  Surgeon: Rod Can, MD;  Location: [facility];  Service: Orthopedics;  Laterality: Right;  ? Lower Arterial  04/13/2004  ? POLYPECTOMY    ? Rest Cardiolite  03/19/2003  ? ?Past Medical History:  ?Diagnosis Date  ? Allergy   ? Anal fissure   ? Anemia, unspecified   ? Arthritis   ? Spinal OA  ? BACK PAIN, LUMBAR 10/23/2007  ? CARDIAC MURMUR, SYSTOLIC 0/10/7791  ? DIABETES MELLITUS, TYPE I 05/20/2007  ? DIASTOLIC DYSFUNCTION 06/26/91  ? 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[place])   ? Duodenitis   ? Edema 05/12/2008  ? Esophageal stricture   ? GERD (gastroesophageal reflux disease)   ? 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 Gynecomastia   ? Hepatic steatosis   ? HYPERLIPIDEMIA 05/20/2007  ? Hypertension   ? Hypogonadism male   ? Morbid obesity ([place]) 09/10/2009  ? OBSTRUCTIVE SLEEP APNEA 11/04/2007  ? Personal history of colonic polyps   ? RENAL INSUFFICIENCY 05/12/2008  ? Sleep apnea   ? uses cpap  ? Squamous cell carcinoma of skin 02/16/2020  ? left lower leg, anterior-cx3,30f  ? Urolithiasis   ? ?BP 120/74   Pulse 94   Ht '5\' 11"'$  (1.803 m)   Wt 264 lb (119.7 kg)   SpO2 98%   BMI 36.82 kg/m?  ? ?Opioid Risk Score:   ?Fall Risk Score:   `1 ? ?Depression screen PHQ 2/9 ? ? ?  02/16/2022  ?  9:48 AM 01/10/2022  ?  1:43 PM 01/10/2022  ?  1:42 PM 01/10/2022  ?  1:40 PM 12/27/2021  ?  3:09 PM 11/15/2021  ?  4:04 PM 08/24/2021  ? 10:11 AM  ?Depression screen PHQ 2/9  ?Decreased Interest 0 0 0 0 0 0 0  ?Down, Depressed, Hopeless 0 0 0 0 0 0 0  ?PHQ - 2 Score 0 0 0 0 0 0 0  ? ? ?Review of Systems  ?HENT: Negative.    ?Eyes: Negative.   ?Respiratory:  Positive for apnea and shortness of breath.   ?Cardiovascular: Negative.   ?Gastrointestinal: Negative.   ?Endocrine:  ?     Diabetic ?High blood sugar  ?Genitourinary: Negative.   ?      ?  ?Musculoskeletal:  Positive for arthralgias, back pain, gait problem, myalgias and neck pain.  ?Skin: Negative.   ?Allergic/Immunologic: Negative.   ?Neurological:  Positive for weakness and numbness.  ?     Tingling  ?Hematological: Negative.   ?All other systems reviewed and are negative. ? ?   ?Objective:  ? Physical Exam  ?Constitutional: No distress . Vital signs reviewed. BMI 36.82, weight 264 lbs ?HENT: Normocephalic.  Atraumatic. ?Eyes: EOMI. No discharge. ?Cardiovascular: No JVD.   ?Respiratory: Normal effort.  No stridor.   ?GI: Non-distended.   ?Skin: No open lesions on feet ?Psych: Normal mood.  Normal behavior. ?Musc: TTP lumbar spinous process ?Musc: Gait: Mildly antalgic ?Neuro: Alert ? HOH ?            Strength          5/5 in all LE myotomes ?Decreased sensation in bilateral feet ?   ?Assessment & Plan:  ?Male with pmh of OSA, CKD, HTN, DM type 1 with peripheral neuropathy presents for follow up of low back pain > neck pain.   ?  ?1. Chronic mechanical low back pain ?            MRIs C,L-spine early 2016 suggesting multilevel spondylosis with mild b/l multilevel foraminal narrowing C4-C7 and shallow disc bulge at L5-S1 without central canal or foraminal stenosis.  ?            Avoid NSAIDs due to CKD ?            Unable to tolerate Gabapentin, Lyrica ?            Cont HEP ? Continue TENS ?            Aquatic  therapy, not going anymore, states ineffective ?            Cont tylenol ? Robaxin 500 TID PRN, changed to Baclofen 10 TID PRN due to change insurance, continue  ? Continue Cymbalta '60mg'$  ?            Cont back brace during per

## 2022-02-21 ENCOUNTER — Encounter: Payer: Self-pay | Admitting: Family Medicine

## 2022-02-21 ENCOUNTER — Ambulatory Visit: Payer: Medicare Other | Admitting: Family Medicine

## 2022-02-21 VITALS — BP 128/68 | HR 85 | Temp 97.6°F | Ht 71.0 in | Wt 266.0 lb

## 2022-02-21 DIAGNOSIS — M1A9XX0 Chronic gout, unspecified, without tophus (tophi): Secondary | ICD-10-CM

## 2022-02-21 DIAGNOSIS — E538 Deficiency of other specified B group vitamins: Secondary | ICD-10-CM

## 2022-02-21 DIAGNOSIS — E7800 Pure hypercholesterolemia, unspecified: Secondary | ICD-10-CM | POA: Diagnosis not present

## 2022-02-21 DIAGNOSIS — N1831 Chronic kidney disease, stage 3a: Secondary | ICD-10-CM

## 2022-02-21 LAB — BASIC METABOLIC PANEL
BUN: 18 mg/dL (ref 6–23)
CO2: 29 mEq/L (ref 19–32)
Calcium: 9.4 mg/dL (ref 8.4–10.5)
Chloride: 105 mEq/L (ref 96–112)
Creatinine, Ser: 1.87 mg/dL — ABNORMAL HIGH (ref 0.40–1.50)
GFR: 33.58 mL/min — ABNORMAL LOW (ref >60.00)
Glucose, Bld: 131 mg/dL — ABNORMAL HIGH (ref 70–99)
Potassium: 3.5 mEq/L (ref 3.5–5.1)
Sodium: 140 mEq/L (ref 135–145)

## 2022-02-21 LAB — URIC ACID: Uric Acid, Serum: 3.9 mg/dL — ABNORMAL LOW (ref 4.0–7.8)

## 2022-02-21 LAB — VITAMIN B12: Vitamin B-12: 409 pg/mL (ref 211–911)

## 2022-02-21 LAB — LDL CHOLESTEROL, DIRECT: Direct LDL: 66 mg/dL

## 2022-02-21 NOTE — Progress Notes (Addendum)
? ?Established Patient Office Visit ? ?Subjective   ?Patient ID: [name], male    DOB: 07/26/1941  Age: 81 y.o. MRN: [id number] ? ?Chief Complaint  ?Patient presents with  ? Follow-up  ?  6 month follow up no concerns. Patient not fasting.   ? ? ?HPI follow-up of elevated cholesterol, history of gout, stage IIIb CKD, and B12 deficiency.  B12 levels remain low normal oral supplementation.  Continues rosuvastatin for LDL cholesterol.  Difficult for him to fast.  He has been declared an insulin-dependent diabetic by his endocrinologist.  Has not had a gouty attack since being on allopurinol.  He did consider renal dosing.  Decreasing pain in OD.  Follow-up is scheduled with ophthalmology in [place] soon. ? ? ? ?Review of Systems  ?Constitutional: Negative.   ?HENT: Negative.    ?Eyes:  Positive for pain. Negative for blurred vision, discharge and redness.  ?Respiratory: Negative.    ?Cardiovascular: Negative.   ?Gastrointestinal:  Negative for abdominal pain.  ?Genitourinary: Negative.   ?Musculoskeletal: Negative.  Negative for myalgias.  ?Skin:  Negative for rash.  ?Neurological:  Negative for tingling, loss of consciousness and weakness.  ?Endo/Heme/Allergies:  Negative for polydipsia.  ? ?  ?Objective:  ?  ? ?BP 128/68 (BP Location: Right Arm, Patient Position: Sitting, Cuff Size: Large)   Pulse 85   Temp 97.6 ?F (36.4 ?C) (Temporal)   Ht '5\' 11"'$  (1.803 m)   Wt 266 lb (120.7 kg)   SpO2 95%   BMI 37.10 kg/m?  ? ? ?Physical Exam ?Constitutional:   ?   General: He is not in acute distress. ?   Appearance: Normal appearance. He is not ill-appearing, toxic-appearing or diaphoretic.  ?HENT:  ?   Head: Normocephalic and atraumatic.  ?   Right Ear: External ear normal.  ?   Left Ear: External ear normal.  ?   Mouth/Throat:  ?   Mouth: Mucous membranes are moist.  ?   Pharynx: Oropharynx is clear. No oropharyngeal exudate or posterior oropharyngeal erythema.  ?Eyes:  ?   General: No scleral icterus.    ?   Left  eye: No discharge.  ?Cardiovascular:  ?   Rate and Rhythm: Normal rate and regular rhythm.  ?Pulmonary:  ?   Effort: Pulmonary effort is normal. No respiratory distress.  ?   Breath sounds: Normal breath sounds.  ?Abdominal:  ?   General: Bowel sounds are normal.  ?Musculoskeletal:  ?   Cervical back: No rigidity or tenderness.  ?Skin: ?   General: Skin is warm and dry.  ?Neurological:  ?   Mental Status: He is alert and oriented to person, place, and time.  ?Psychiatric:     ?   Mood and Affect: Mood normal.     ?   Behavior: Behavior normal.  ? ? ? ?Results for orders placed or performed in visit on 02/21/22  ?Vitamin B12  ?Result Value Ref Range  ? Vitamin B-12 409 211 - 911 pg/mL  ?Basic metabolic panel  ?Result Value Ref Range  ? Sodium 140 135 - 145 mEq/L  ? Potassium 3.5 3.5 - 5.1 mEq/L  ? Chloride 105 96 - 112 mEq/L  ? CO2 29 19 - 32 mEq/L  ? Glucose, Bld 131 (H) 70 - 99 mg/dL  ? BUN 18 6 - 23 mg/dL  ? Creatinine, Ser 1.87 (H) 0.40 - 1.50 mg/dL  ? GFR 33.58 (L) >60.00 mL/min  ? Calcium 9.4 8.4 - 10.5 mg/dL  ?LDL  cholesterol, direct  ?Result Value Ref Range  ? Direct LDL 66.0 mg/dL  ?Uric acid  ?Result Value Ref Range  ? Uric Acid, Serum 3.9 (L) 4.0 - 7.8 mg/dL  ? ? ? ? ?The ASCVD Risk score (Arnett DK, et al., 2019) failed to calculate for the following reasons: ?  The 2019 ASCVD risk score is only valid for ages 57 to 67 ? ? Estimated Creatinine Clearance: 41.7 mL/min (A) (by C-G formula based on SCr of 1.87 mg/dL (H)).  ?Assessment & Plan:  ? ?Problem List Items Addressed This Visit   ? ?  ? Genitourinary  ? Stage 3a chronic kidney disease ([place]) - Primary  ? Relevant Orders  ? Basic metabolic panel (Completed)  ?  ? Other  ? GOUT  ? Relevant Orders  ? Uric acid (Completed)  ? Elevated LDL cholesterol level  ? Relevant Orders  ? LDL cholesterol, direct (Completed)  ? B12 deficiency  ? Relevant Orders  ? Vitamin B12 (Completed)  ? ? ?Return in about 3 months (around 05/24/2022).  ? ? ?Libby Maw,  MD ?Continue all medications as above.  Discussed decreasing allopurinol with renal dosing. ? ? ?

## 2022-02-22 ENCOUNTER — Ambulatory Visit: Payer: Medicare Other | Admitting: Endocrinology

## 2022-02-22 VITALS — BP 130/78 | HR 82 | Ht 71.0 in | Wt 269.2 lb

## 2022-02-22 DIAGNOSIS — E1159 Type 2 diabetes mellitus with other circulatory complications: Secondary | ICD-10-CM | POA: Diagnosis not present

## 2022-02-22 DIAGNOSIS — E1142 Type 2 diabetes mellitus with diabetic polyneuropathy: Secondary | ICD-10-CM

## 2022-02-22 DIAGNOSIS — E1122 Type 2 diabetes mellitus with diabetic chronic kidney disease: Secondary | ICD-10-CM

## 2022-02-22 DIAGNOSIS — E0800 Diabetes mellitus due to underlying condition with hyperosmolarity without nonketotic hyperglycemic-hyperosmolar coma (NKHHC): Secondary | ICD-10-CM

## 2022-02-22 DIAGNOSIS — N184 Chronic kidney disease, stage 4 (severe): Secondary | ICD-10-CM

## 2022-02-22 DIAGNOSIS — E669 Obesity, unspecified: Secondary | ICD-10-CM

## 2022-02-22 LAB — POCT GLYCOSYLATED HEMOGLOBIN (HGB A1C): Hemoglobin A1C: 6.6 % — AB (ref 4.0–5.6)

## 2022-02-22 MED ORDER — SEMAGLUTIDE (1 MG/DOSE) 4 MG/3ML ~~LOC~~ SOPN: 1.0000 mg | PEN_INJECTOR | SUBCUTANEOUS | 1 refills | Status: DC

## 2022-02-22 NOTE — Patient Instructions (Addendum)
I have sent a prescription to your pharmacy, to increase the Ozempic.   ?Please take these pump settings:  ?basal rate of 1 unit/hr (when not in "control-IQ" mode).   ?mealtime bolus of 1 unit/4 grams carbohydrate.  ?continue correction bolus (which some people call "sensitivity," or "insulin sensitivity ratio," or just "isr") of 1 unit for each 60 by which your glucose exceeds 130.   ?You should have an endocrinology follow-up appointment in 3 months.   ? ?

## 2022-02-22 NOTE — Progress Notes (Signed)
? ?Subjective:  ? ? Patient ID: [name], male    DOB: 1941/08/18, 81 y.o.   MRN: [id number] ? ?HPI ?Pt returns for f/u of diabetes mellitus:  ?DM type: Insulin-requiring type 2.   ?Dx'ed: 1986 ?Complications: PN, CAD, and stage 4 CRI.   ?Therapy: insulin since 1995, and Ozempic ?DKA: never.  ?Severe hypoglycemia: never.  ?Pancreatitis: never.  ?Other: he started pump therapy (now T-slim, since mid-2015), and dexcom G-6 continuous glucose monitor.   ?Interval history: he takes these pump settings:   ?basal rate of 1.5 units/hr (when not in "control-IQ" mode).   ?mealtime bolus of 1 unit/4 grams carbohydrate.  ?continue correction bolus (which some people call "sensitivity," or "insulin sensitivity ratio," or just "isr") of 1 unit for each 60 by which glucose exceeds 100.   ?"Control-IQ" is on 99% of the time ?TDD is approx 80 units (52% basal, 45% meal bolus, 1% correction bolus).   ?I reviewed continuous glucose monitor data.  Glucose varies from 80-310, but most are in the 100's.  It is in general highest at 11AM and 10PM.  it is lowest 3AM-8AM.  It is flat overnight.   ?He has hypoglycemia just 1/month, and these episodes are mild.   ?Past Medical History:  ?Diagnosis Date  ? Allergy   ? Anal fissure   ? Anemia, unspecified   ? Arthritis   ? Spinal OA  ? BACK PAIN, LUMBAR 10/23/2007  ? CARDIAC MURMUR, SYSTOLIC 02/24/4919  ? DIABETES MELLITUS, TYPE I 05/20/2007  ? DIASTOLIC DYSFUNCTION 1/00/7121  ? 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[place])   ? Duodenitis   ? Edema 05/12/2008  ? Esophageal stricture   ? GERD (gastroesophageal reflux disease)   ? 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 Gynecomastia   ? Hepatic steatosis   ? HYPERLIPIDEMIA 05/20/2007  ? Hypertension   ? Hypogonadism male   ? Morbid obesity ([place]) 09/10/2009  ? OBSTRUCTIVE SLEEP APNEA 11/04/2007  ? Personal history of colonic polyps   ? RENAL INSUFFICIENCY 05/12/2008  ? Sleep apnea   ? uses cpap  ? Squamous cell carcinoma of skin 02/16/2020  ? left lower leg,  anterior-cx3,28f  ? Urolithiasis   ? ? ?Past Surgical History:  ?Procedure Laterality Date  ? 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 arm fracture Left 1958  ? with hardware  ? Colon cancer screening    ? COLONOSCOPY  08/18/2004  ? diverticulitis  ? COLONOSCOPY  09/24/2009  ? ELECTROCARDIOGRAM  02/2006  ? FLEXIBLE SIGMOIDOSCOPY  03/04/2001  ? polyps, anal fissure  ? GANGLION CYST EXCISION Left 1980  ? HIP PINNING,CANNULATED Right 04/25/2021  ? Procedure: CANNULATED HIP PINNING;  Surgeon: SRod Can MD;  Location: [facility];  Service: Orthopedics;  Laterality: Right;  ? Lower Arterial  04/13/2004  ? POLYPECTOMY    ? Rest Cardiolite  03/19/2003  ? ? ?Social History  ? ?Socioeconomic History  ? Marital status: Married  ?  Spouse name: Not on file  ? Number of children: Not on file  ? Years of education: Not on file  ? Highest education level: Not on file  ?Occupational History  ?  Employer: [employer]  ?Tobacco Use  ? Smoking status: Former  ?  Packs/day: 2.00  ?  Years: 31.00  ?  Pack years: 62.00  ?  Types: Cigarettes  ?  Start date: 144 ?  Quit date: 10/23/1982  ?  Years since quitting: 39.3  ? Smokeless tobacco: Never  ?Vaping Use  ?  Vaping Use: Never used  ?Substance and Sexual Activity  ? Alcohol use: No  ?  Alcohol/week: 0.0 standard drinks  ? Drug use: No  ? Sexual activity: Not on file  ?Other Topics Concern  ? Not on file  ?Social History Narrative  ? Lives with wife in a one story home.  Has a son and a daughter.    ? 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    ? Education: 2 years of college.  ? ?Social Determinants of Health  ? ?Financial Resource Strain: Low Risk   ? Difficulty of Paying Living Expenses: Not hard at all  ?Food Insecurity: No Food Insecurity  ? Worried About Charity fundraiser in the Last Year: Never true  ? Ran Out of Food in the Last Year: Never true  ?Transportation Needs: No Transportation Needs  ? Lack of Transportation (Medical): No  ? Lack of Transportation (Non-Medical): No   ?Physical Activity: Inactive  ? Days of Exercise per Week: 0 days  ? Minutes of Exercise per Session: 0 min  ?Stress: No Stress Concern Present  ? Feeling of Stress : Not at all  ?Social Connections: Socially Integrated  ? Frequency of Communication with Friends and Family: Twice a week  ? Frequency of Social Gatherings with Friends and Family: Twice a week  ? Attends Religious Services: 1 to 4 times per year  ? Active Member of Clubs or Organizations: Yes  ? Attends Archivist Meetings: 1 to 4 times per year  ? Marital Status: Married  ?Intimate Partner Violence: Not At Risk  ? Fear of Current or Ex-Partner: No  ? Emotionally Abused: No  ? Physically Abused: No  ? Sexually Abused: No  ? ? ?Current Outpatient Medications on File Prior to Visit  ?Medication Sig Dispense Refill  ? Accu-Chek FastClix Lancets MISC USE TO CHECK BLOOD SUGAR UP TO 6 TIMES DAILY 612 each 3  ? alfuzosin (UROXATRAL) 10 MG 24 hr tablet TAKE 1 TABLET BY MOUTH  DAILY WITH BREAKFAST 90 tablet 3  ? amitriptyline (ELAVIL) 50 MG tablet Take 0.5 tablets (25 mg total) by mouth at bedtime. 45 tablet 3  ? Blood Glucose Monitoring Suppl (ACCU-CHEK AVIVA PLUS) w/Device KIT Use as directed 1 kit 0  ? cetirizine (ZYRTEC) 10 MG tablet Take 10 mg by mouth daily as needed for allergies.    ? Continuous Blood Gluc Sensor (DEXCOM G6 SENSOR) MISC Inject 1 Device into the skin as directed. Apply to skin SQ every 10 days 9 each 3  ? diclofenac Sodium (VOLTAREN) 1 % GEL Apply 2 g topically 4 (four) times daily. 150 g 3  ? DULoxetine (CYMBALTA) 60 MG capsule Take 1 capsule (60 mg total) by mouth daily. 90 capsule 3  ? fluticasone (FLONASE) 50 MCG/ACT nasal spray Place 1 spray into the nose daily as needed for allergies.     ? folic acid (FOLVITE) 1 MG tablet Take 1 mg by mouth daily.    ? furosemide (LASIX) 20 MG tablet TAKE 1 TABLET BY MOUTH  DAILY 90 tablet 3  ? glucose blood (ACCU-CHEK AVIVA PLUS) test strip USE TO TEST BLOOD SUGAR 6  TIMES PER DAY;  E11.9 600 each 11  ? HUMALOG 100 UNIT/ML injection INJECT SUBCUTANEOUSLY VIA  INSULIN PUMP TOTAL OF 120  UNITS PER DAY 110 mL 3  ? Insulin Infusion Pump Supplies (AUTOSOFT XC INFUSION SET) MISC Inject 1 Act into the skin every other day. Use with 63m  cannula and 23 inch tubing. *5 boxes (90 day supply) 5 each 3  ? Insulin Infusion Pump Supplies (T:SLIM INSULIN CARTRIDGE 3ML) MISC Use with insulin pump, fill once every 2 days. 5 each 3  ? losartan (COZAAR) 25 MG tablet Take 12.5 mg by mouth daily.    ? Multiple Vitamins-Minerals (CENTRUM SILVER PO) Take 1 tablet by mouth daily.    ? Omega-3 Fatty Acids (FISH OIL) 1200 MG CAPS Take 1,200 mg by mouth daily.    ? omeprazole (PRILOSEC) 20 MG capsule Take 1 capsule (20 mg total) by mouth daily as needed. 90 capsule 2  ? rosuvastatin (CRESTOR) 40 MG tablet TAKE 1 TABLET BY MOUTH AT  BEDTIME 90 tablet 3  ? vitamin B-12 (CYANOCOBALAMIN) 1000 MCG tablet Take 1,000 mcg by mouth daily.    ? ?No current facility-administered medications on file prior to visit.  ? ? ?Allergies  ?Allergen Reactions  ? Atorvastatin Calcium   ? Lipitor [Atorvastatin] Other (See Comments)  ?  unknown  ? Niacin   ?  REACTION: Severe heartburn  ? Pioglitazone   ?  REACTION: Edema  ? ? ?Family History  ?Problem Relation Age of Onset  ? Colon cancer Brother 55  ? Colon polyps Brother   ? Lung cancer Brother   ? Other Mother   ?     MVA, deceased 26s  ? Healthy Daughter   ? Healthy Son   ? Rectal cancer Neg Hx   ? Stomach cancer Neg Hx   ? ? ?BP 130/78 (BP Location: Left Arm, Patient Position: Sitting, Cuff Size: Normal)   Pulse 82   Ht '5\' 11"'  (1.803 m)   Wt 269 lb 3.2 oz (122.1 kg)   SpO2 96%   BMI 37.55 kg/m?  ? ? ?Review of Systems ?Denies N/HB.   ?   ?Objective:  ? Physical Exam ?VITAL SIGNS:  See vs page.    ?GENERAL: no distress.   ? ? ?A1c=6.6% ? ?   ?Assessment & Plan:  ?Insulin-requiring type 2 DM: overcontrolled.   ?Obesity: uncontrolled ? ? ?Patient Instructions  ?I have sent a  prescription to your pharmacy, to increase the Ozempic.   ?Please take these pump settings:  ?basal rate of 1 unit/hr (when not in "control-IQ" mode).   ?mealtime bolus of 1 unit/4 grams carbohydrate.  ?continue correction bolus (wh

## 2022-02-24 MED ORDER — ALLOPURINOL 100 MG PO TABS: 150.0000 mg | ORAL_TABLET | Freq: Every day | ORAL | 3 refills | Status: DC

## 2022-02-24 NOTE — Addendum Note (Signed)
Addended by: Abelino Derrick A on: 02/24/2022 12:50 PM ? ? Modules accepted: Orders ? ?

## 2022-03-02 ENCOUNTER — Encounter: Payer: Self-pay | Admitting: Endocrinology

## 2022-05-01 ENCOUNTER — Other Ambulatory Visit: Payer: Self-pay

## 2022-05-01 DIAGNOSIS — E0800 Diabetes mellitus due to underlying condition with hyperosmolarity without nonketotic hyperglycemic-hyperosmolar coma (NKHHC): Secondary | ICD-10-CM

## 2022-05-01 MED ORDER — INSULIN LISPRO 100 UNIT/ML IJ SOLN: INTRAMUSCULAR | 3 refills | Status: DC

## 2022-05-17 ENCOUNTER — Other Ambulatory Visit: Payer: Self-pay | Admitting: Endocrinology

## 2022-05-17 DIAGNOSIS — E1165 Type 2 diabetes mellitus with hyperglycemia: Secondary | ICD-10-CM

## 2022-05-17 DIAGNOSIS — E0800 Diabetes mellitus due to underlying condition with hyperosmolarity without nonketotic hyperglycemic-hyperosmolar coma (NKHHC): Secondary | ICD-10-CM

## 2022-05-18 ENCOUNTER — Other Ambulatory Visit (INDEPENDENT_AMBULATORY_CARE_PROVIDER_SITE_OTHER): Payer: Medicare Other

## 2022-05-18 DIAGNOSIS — Z794 Long term (current) use of insulin: Secondary | ICD-10-CM | POA: Diagnosis not present

## 2022-05-18 DIAGNOSIS — E1165 Type 2 diabetes mellitus with hyperglycemia: Secondary | ICD-10-CM | POA: Diagnosis not present

## 2022-05-18 LAB — BASIC METABOLIC PANEL
BUN: 25 mg/dL — ABNORMAL HIGH (ref 6–23)
CO2: 32 mEq/L (ref 19–32)
Calcium: 9.5 mg/dL (ref 8.4–10.5)
Chloride: 104 mEq/L (ref 96–112)
Creatinine, Ser: 1.7 mg/dL — ABNORMAL HIGH (ref 0.40–1.50)
GFR: 37.59 mL/min — ABNORMAL LOW (ref >60.00)
Glucose, Bld: 169 mg/dL — ABNORMAL HIGH (ref 70–99)
Potassium: 3.8 mEq/L (ref 3.5–5.1)
Sodium: 142 mEq/L (ref 135–145)

## 2022-05-18 LAB — HEMOGLOBIN A1C: Hgb A1c MFr Bld: 7.6 % — ABNORMAL HIGH (ref 4.6–6.5)

## 2022-05-23 ENCOUNTER — Encounter: Payer: Self-pay | Admitting: Endocrinology

## 2022-05-23 ENCOUNTER — Ambulatory Visit: Payer: Medicare Other | Admitting: Endocrinology

## 2022-05-23 VITALS — BP 128/66 | HR 86 | Ht 71.0 in | Wt 268.2 lb

## 2022-05-23 DIAGNOSIS — N1831 Chronic kidney disease, stage 3a: Secondary | ICD-10-CM

## 2022-05-23 DIAGNOSIS — E669 Obesity, unspecified: Secondary | ICD-10-CM

## 2022-05-23 DIAGNOSIS — E1122 Type 2 diabetes mellitus with diabetic chronic kidney disease: Secondary | ICD-10-CM | POA: Diagnosis not present

## 2022-05-23 DIAGNOSIS — I10 Essential (primary) hypertension: Secondary | ICD-10-CM

## 2022-05-23 DIAGNOSIS — N189 Chronic kidney disease, unspecified: Secondary | ICD-10-CM | POA: Diagnosis not present

## 2022-05-23 DIAGNOSIS — E1165 Type 2 diabetes mellitus with hyperglycemia: Secondary | ICD-10-CM

## 2022-05-23 DIAGNOSIS — E1169 Type 2 diabetes mellitus with other specified complication: Secondary | ICD-10-CM | POA: Diagnosis not present

## 2022-05-23 DIAGNOSIS — E7849 Other hyperlipidemia: Secondary | ICD-10-CM

## 2022-05-23 DIAGNOSIS — E1159 Type 2 diabetes mellitus with other circulatory complications: Secondary | ICD-10-CM

## 2022-05-23 DIAGNOSIS — Z794 Long term (current) use of insulin: Secondary | ICD-10-CM

## 2022-05-23 NOTE — Patient Instructions (Addendum)
Settings   Basal 12-8 am = 1.65 and carb ratio 1: 4  8-12 noon= 1.8 Carb ratio 1:3  Noon to 12 MN = 1.75 Carb 1:4  Stop Ozempic; try 0.5 next dose  Bolus at dinner daily

## 2022-05-23 NOTE — Progress Notes (Signed)
Patient ID: [name], male   DOB: 10/24/1940, 81 y.o.   MRN: [id number]           Reason for Appointment: Type I Diabetes follow-up   History of Present Illness   Diagnosis date: 1986  Previous history:  Insulin was started in 1995  A1c range in the last few years is:  Recent history:     Non-insulin hypoglycemic drugs: Ozempic 0.84m weekly     Insulin pump: T-slim control IQ       BASAL rate 1.5 Carbohydrate coverage 1: 4 Active insulin 3 hours, correction factor I: 60  Current self management, blood sugar patterns and problems identified:  A1c is relatively higher at 7.6 compared to 6.6 His blood sugars are averaging over 200 at any given segment of the day and mostly in the low 200 average range However blood sugar patterns are quite inconsistent from day-to-day Currently he is not set up for the T-contact app and his pump was downloaded with difficulty  Also not clear why he has turned off his control IQ 26% of the time He also has a few days her blood sugars are markedly high for extended periods of time, not clear if this is related to any infusion site problems With his basal rate being the same on 24 hours his blood sugars are not as well controlled during the daytime compared to early morning hours BOLUSES: Although he is reporting boluses before eating he appears to have some boluses that are relatively late and possibly missed boluses at dinnertime at times Also correction factor does not appear to be adequate Usually not adjusting for any fat content in his meals He does not think he has benefited from [place] also he did have some diarrhea when he tried the 0.5 dose, he says he has a 0.5 device at home although his medication list indicates he is taking 1 mg  Exercise: Minimal, limited by chronic back pain Diet management: Sometimes will have appropriate or that breakfast otherwise oatmeal     Hypoglycemia:  none   CGM data interpretation for 2 weeks  His  target blood sugar has been set between 70-200  his blood sugars are relatively better overnight although starting off over 200 average and then generally below 200 average except lowest average is only about 180 Overnight blood sugars are more fluctuating as much but usually well above target and no hypoglycemia POSTPRANDIAL blood sugars tend to be relatively higher after breakfast on some days and also sporadically at other meals especially dinner He has much more variability in his blood sugars between midday and midnight with highest readings overall around 11 AM and mid afternoon   CGM use % of time 96  2-week average/SD 214/52  Time in range 70-200        46%  % Time Above 200 54  % Time above 250   % Time Below 70 0      PRE-MEAL  overnight  mornings  afternoon  evening Overall  Glucose range:       Averages:  204  209 227  216 214    Weight history:  Wt Readings from Last 3 Encounters:  05/23/22 268 lb 3.2 oz (121.7 kg)  02/22/22 269 lb 3.2 oz (122.1 kg)  02/21/22 266 lb (120.7 kg)            Diabetes labs:  Lab Results  Component Value Date   HGBA1C 7.6 (H) 05/18/2022   HGBA1C 6.6 (A)  02/22/2022   HGBA1C 6.8 (A) 11/23/2021   Lab Results  Component Value Date   MICROALBUR 4.3 (H) 08/24/2021   LDLCALC 66 02/28/2021   CREATININE 1.70 (H) 05/18/2022    No results found for: "FRUCTOSAMINE"   Allergies as of 05/23/2022       Reactions   Atorvastatin Calcium    Lipitor [atorvastatin] Other (See Comments)   unknown   Niacin    REACTION: Severe heartburn   Pioglitazone    REACTION: Edema        Medication List        Accurate as of May 23, 2022  4:58 PM. If you have any questions, ask your nurse or doctor.          Accu-Chek Aviva Plus w/Device Kit Use as directed   Accu-Chek FastClix Lancets Misc USE TO CHECK BLOOD SUGAR UP TO 6 TIMES DAILY   alfuzosin 10 MG 24 hr tablet Commonly known as: UROXATRAL TAKE 1 TABLET BY MOUTH  DAILY WITH  BREAKFAST   amitriptyline 50 MG tablet Commonly known as: ELAVIL Take 0.5 tablets (25 mg total) by mouth at bedtime.   CENTRUM SILVER PO Take 1 tablet by mouth daily.   cetirizine 10 MG tablet Commonly known as: ZYRTEC Take 10 mg by mouth daily as needed for allergies.   cyanocobalamin 1000 MCG tablet Commonly known as: VITAMIN B12 Take 1,000 mcg by mouth daily.   Dexcom G6 Sensor Misc Inject 1 Device into the skin as directed. Apply to skin SQ every 10 days   diclofenac Sodium 1 % Gel Commonly known as: Voltaren Apply 2 g topically 4 (four) times daily.   DULoxetine 60 MG capsule Commonly known as: CYMBALTA Take 1 capsule (60 mg total) by mouth daily.   Fish Oil 1200 MG Caps Take 1,200 mg by mouth daily.   fluticasone 50 MCG/ACT nasal spray Commonly known as: FLONASE Place 1 spray into the nose daily as needed for allergies.   folic acid 1 MG tablet Commonly known as: FOLVITE Take 1 mg by mouth daily.   furosemide 20 MG tablet Commonly known as: LASIX TAKE 1 TABLET BY MOUTH  DAILY   glucose blood test strip Commonly known as: Accu-Chek Aviva Plus USE TO TEST BLOOD SUGAR 6  TIMES PER DAY; E11.9   insulin lispro 100 UNIT/ML injection Commonly known as: HumaLOG INJECT SUBCUTANEOUSLY VIA  INSULIN PUMP TOTAL OF 120  UNITS PER DAY   losartan 25 MG tablet Commonly known as: COZAAR Take 12.5 mg by mouth daily.   omeprazole 20 MG capsule Commonly known as: PRILOSEC Take 1 capsule (20 mg total) by mouth daily as needed.   rosuvastatin 40 MG tablet Commonly known as: CRESTOR TAKE 1 TABLET BY MOUTH AT  BEDTIME   Semaglutide (1 MG/DOSE) 4 MG/3ML Sopn Inject 1 mg as directed once a week.   T:slim Insulin Cartridge 40m Misc Use with insulin pump, fill once every 2 days.   AutoSoft XC Infusion Set Misc Inject 1 Act into the skin every other day. Use with 976mcannula and 23 inch tubing. 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[place])    Duodenitis    Edema 05/12/2008   Esophageal stricture    GERD (gastroesophageal reflux disease)    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   Gynecomastia    Hepatic steatosis    HYPERLIPIDEMIA 05/20/2007   Hypertension    Hypogonadism male    Morbid obesity ([place]) 09/10/2009   OBSTRUCTIVE SLEEP APNEA 11/04/2007   Personal history of colonic polyps    RENAL INSUFFICIENCY 05/12/2008   Sleep apnea    uses cpap   Squamous cell carcinoma of skin 02/16/2020   left lower leg, anterior-cx3,70f   Urolithiasis     Past Surgical History:  Procedure Laterality Date   Abdominal [place] 09/1997   arm fracture Left 1958   with hardware   Colon cancer screening     COLONOSCOPY  08/18/2004   diverticulitis   COLONOSCOPY  09/24/2009   ELECTROCARDIOGRAM  02/2006   FLEXIBLE SIGMOIDOSCOPY  03/04/2001   polyps, anal fissure   GANGLION CYST EXCISION Left 1980   HIP PINNING,CANNULATED Right 04/25/2021   Procedure: CANNULATED HIP PINNING;  Surgeon: SRod Can MD;  Location: [facility];  Service: Orthopedics;  Laterality: Right;   Lower Arterial  04/13/2004   POLYPECTOMY     Rest Cardiolite  03/19/2003    Family History  Problem Relation Age of Onset   Colon cancer Brother 528  Colon polyps Brother    Lung cancer Brother    Other Mother        MVA, deceased 532s  Healthy Daughter    Healthy Son    Rectal cancer Neg Hx    Stomach cancer Neg Hx     Social History:  reports that he quit smoking about 39 years ago. His smoking use included  cigarettes. He started smoking about 68 years ago. He has a 62.00 pack-year smoking history. He has never used smokeless tobacco. He reports that he does not drink alcohol and does not use drugs.  Review of Systems:  Last diabetic eye exam date 2/23, reportedly no retinopathy  Last foot exam date: 7/22  History neuropathy: Present, treated with duloxetine and amitriptyline  Hypertension:   Treatment includes losartan 12.5 mg daily  BP Readings from Last 3 Encounters:  05/23/22 128/66  02/22/22 130/78  02/21/22 128/68    Lipid management: Has been treated with Crestor 40 mg daily by his PCP    Lab Results  Component Value Date   CHOL 135 02/28/2021   CHOL 116 07/10/2019   CHOL 127 05/09/2018   Lab Results  Component Value Date   HDL 33.60 (L) 02/28/2021   HDL 32.20 (L) 07/10/2019   HDL 34.30 (L) 05/09/2018   Lab Results  Component Value Date   LDLCALC 66 02/28/2021   LDLCALC 57 07/10/2019   LDLCALC 59 05/09/2018   Lab Results  Component Value Date   TRIG 174.0 (H) 02/28/2021   TRIG 137.0 07/10/2019   TRIG 170.0 (H) 05/09/2018   Lab Results  Component Value Date   CHOLHDL 4 02/28/2021   CHOLHDL 4 07/10/2019   CHOLHDL 4 05/09/2018   Lab Results  Component Value Date   LDLDIRECT 66.0 02/21/2022   LDLDIRECT 76.0 08/24/2021   LDLDIRECT 63.0 07/12/2020     Examination:  BP 128/66   Pulse 86   Ht '5\' 11"'  (1.803 m)   Wt 268 lb 3.2 oz (121.7 kg)   SpO2 98%   BMI 37.41 kg/m   Body mass index is 37.41 kg/m.    ASSESSMENT/ PLAN:    Diabetes type 2 insulin-dependent:   Current regimen: T-insulin insulin pump with control IQ  See history of present illness for detailed discussion of current diabetes management, blood sugar patterns and problems identified  A1c is 7.6 However his recent blood sugar averages 214 and an A1c may be falsely low He does have renal insufficiency  Blood glucose control is fair considering his age but can be improved as his  time in range is more adequate at only 46% This is despite taking nearly 100 units of insulin a day on his pump  Recommendations:  Increase basal rates at the time where he appears to be most hyperglycemic; will increase this stepwise to avoid hypoglycemia He was given instructions to increase his basal rates as follows  Settings for 12-8 am = 1.65 and carb ratio 1: 4 8-12 noon= 1.8 Carb ratio 1:3 Noon to 12 MN = 1.75 Carb 1:4  Also likely may benefit from changing his correction factor to 1: 40  Discussed importance of bolusing for all meals and snacks as he may be missing his dinnertime boluses periodically Also he likely needs to add another 10 to 15 g to his carbohydrate entry when he is eating any high fat meals such as apple fitters in the morning  Stop Ozempic when finished since he cannot afford this, however to see if he has any benefit he can try 0.5 mg dosage on his next injection; if he sees any benefit he may be tried this again when he goes out of the donut hole Encouraged him to exercise or walk as tolerated He should also be followed up by the diabetes educator to see how he is progressing and fine-tune his management  CHRONIC KIDNEY disease: Discussed that this is not related to diabetes but he still may benefit from an SGLT2 drug such as [place] and he will discuss this with his nephrologist today Given him patient information brochure on chronic kidney disease and Farxiga  Patient Instructions  Settings   Basal 12-8 am = 1.65 and carb ratio 1: 4  8-12 noon= 1.8 Carb ratio 1:3  Noon to 12 MN = 1.75 Carb 1:4  Stop Ozempic; try 0.5 next dose  Bolus at dinner daily   Elayne Snare 05/23/2022, 4:58 PM   Total visit time for evaluation and management, review of relevant records and labs, counseling = 45 minutes

## 2022-05-24 ENCOUNTER — Encounter: Payer: Self-pay | Admitting: Family Medicine

## 2022-05-24 ENCOUNTER — Ambulatory Visit: Payer: Medicare Other | Admitting: Family Medicine

## 2022-05-24 ENCOUNTER — Other Ambulatory Visit: Payer: Self-pay | Admitting: Endocrinology

## 2022-05-24 VITALS — BP 118/68 | HR 83 | Temp 97.1°F | Ht 71.0 in | Wt 269.8 lb

## 2022-05-24 DIAGNOSIS — E7800 Pure hypercholesterolemia, unspecified: Secondary | ICD-10-CM | POA: Diagnosis not present

## 2022-05-24 DIAGNOSIS — N1831 Chronic kidney disease, stage 3a: Secondary | ICD-10-CM | POA: Diagnosis not present

## 2022-05-24 DIAGNOSIS — E538 Deficiency of other specified B group vitamins: Secondary | ICD-10-CM | POA: Diagnosis not present

## 2022-05-24 DIAGNOSIS — M1A9XX0 Chronic gout, unspecified, without tophus (tophi): Secondary | ICD-10-CM | POA: Diagnosis not present

## 2022-05-24 DIAGNOSIS — E1165 Type 2 diabetes mellitus with hyperglycemia: Secondary | ICD-10-CM

## 2022-05-24 NOTE — Progress Notes (Signed)
   Established Patient Office Visit  Subjective   Patient ID: [name], male    DOB: 10/02/1941  Age: 81 y.o. MRN: [id number]  Chief Complaint  Patient presents with   Follow-up    3 month follow up, no concerns. Patient not fasting.     HPI for follow-up of depression, B12 deficiency and history of gout.  Continues with duloxetine with good effect.  Continue supplementing with B12 allopurinol.    Review of Systems  Constitutional: Negative.   HENT: Negative.    Eyes:  Negative for blurred vision, discharge and redness.  Respiratory: Negative.    Cardiovascular: Negative.   Gastrointestinal:  Negative for abdominal pain.  Genitourinary: Negative.   Musculoskeletal: Negative.  Negative for myalgias.  Skin:  Negative for rash.  Neurological:  Negative for tingling, loss of consciousness and weakness.  Endo/Heme/Allergies:  Negative for polydipsia.      05/24/2022   10:30 AM 02/16/2022    9:48 AM 01/10/2022    1:43 PM  Depression screen PHQ 2/9  Decreased Interest 0 0 0  Down, Depressed, Hopeless 0 0 0  PHQ - 2 Score 0 0 0       Objective:     BP 118/68 (BP Location: Right Arm, Patient Position: Sitting, Cuff Size: Normal)   Pulse 83   Temp (!) 97.1 F (36.2 C) (Temporal)   Ht '5\' 11"'$  (1.803 m)   Wt 269 lb 12.8 oz (122.4 kg)   SpO2 96%   BMI 37.63 kg/m    Physical Exam Constitutional:      General: He is not in acute distress.    Appearance: Normal appearance. He is not ill-appearing, toxic-appearing or diaphoretic.  HENT:     Head: Normocephalic and atraumatic.     Right Ear: External ear normal.     Left Ear: External ear normal.     Mouth/Throat:     Pharynx: No oropharyngeal exudate or posterior oropharyngeal erythema.  Eyes:     General: No scleral icterus.       Right eye: No discharge.        Left eye: No discharge.  Pulmonary:     Effort: Pulmonary effort is normal. No respiratory distress.  Abdominal:     General: Bowel sounds are normal.      Tenderness: There is no abdominal tenderness. There is no guarding.  Musculoskeletal:     Cervical back: No rigidity or tenderness.  Skin:    General: Skin is warm and dry.  Neurological:     Mental Status: He is alert and oriented to person, place, and time.      No results found for any visits on 05/24/22.    The ASCVD Risk score (Arnett DK, et al., 2019) failed to calculate for the following reasons:   The 2019 ASCVD risk score is only valid for ages 53 to 68    Assessment & Plan:   Problem List Items Addressed This Visit   None   No follow-ups on file.  Continue current medications  Libby Maw, MD

## 2022-06-01 ENCOUNTER — Encounter: Payer: Self-pay | Admitting: Endocrinology

## 2022-06-06 ENCOUNTER — Encounter: Payer: Medicare Other | Attending: Family Medicine | Admitting: Nutrition

## 2022-06-06 DIAGNOSIS — E1142 Type 2 diabetes mellitus with diabetic polyneuropathy: Secondary | ICD-10-CM | POA: Diagnosis present

## 2022-06-07 NOTE — Progress Notes (Signed)
[name] is here to day because he wanted to make sure he had made the correct changes to he settings.  Also he did not know how to make the changes to his "correction factor".  The basal rate changes and carb ratios were changed correctly.  He was shown how to make the changes from 60 to 40 on the correction factors, and this was done at each basal rate change.   We reviewed carb counting, and it appears he is doing this correctly.  I will review his pump download in one week and call him to discuss this.   He had deleted his clarity app by mistake and this was reinstalled to his phone and connected to [place].   He had no final questions.

## 2022-06-07 NOTE — Patient Instructions (Signed)
Call office in one week

## 2022-06-13 ENCOUNTER — Telehealth: Payer: Self-pay

## 2022-06-13 ENCOUNTER — Encounter: Payer: Self-pay | Admitting: Internal Medicine

## 2022-06-13 NOTE — Telephone Encounter (Signed)
Inbound fax requesting recent clinical notes. Clinical notes routed to [facility] at [phone] via epic

## 2022-06-14 ENCOUNTER — Ambulatory Visit: Payer: Medicare Other | Admitting: Internal Medicine

## 2022-06-14 NOTE — Progress Notes (Signed)
Subjective:    Patient ID: [name], male    DOB: 1941/07/22, 81 y.o.   MRN: [id number]  HPI   male former smoker followed for OSA, complicated by obesity, HBP, DM 2, diastolic dysfunction, GERD NPSG 2004 AHI 9/hr  --------------------------------------------------------------------------------------------   06/14/21-  81 year old male former smoker followed for OSA, lung nodules,  complicated by obesity, HBP, DM 2, diastolic dysfunction, GERD, HLD, CKD,  CPAP 11.6/[facility] Download-compliance 100%, AHI 6.1/ hr Body weight today- 263 lbs Covid vax- R Hip Fx SGY 7/4 Very comfortable with CPAP, sleeping well. Download reviewed. Machine over 32 years old. Discussed replacement. Breathing fine with little cough. Tolerated surgery/ anesthesia for hip pinning in July. Discussed CXR report with question of LUL density.   CXR 04/23/21 1V-  IMPRESSION: Increased density overlying the UPPER LEFT lung which may be related to the end of the LEFT 1st rib. Recommend PA and lateral chest x-ray for further evaluation. No other significant abnormality.  06/15/22-  81 year old male former smoker followed for OSA, Lung Nodules,  complicated by obesity, HBP, DM 2, diastolic dysfunction, GERD, HLD, CKD,  CPAP auto 5-15/ [facility]       AirSense 11 AutoSet Download-compliance  33%, AHI 8.2/ hr Body weight today- 269 lbs Covid vax- Download reviewed.  He claims he feels sleeping well without CPAP.  Baseline NPSG 2004 was only AHI 9.  He agrees to update sleep study. CXR last year suggested?  Nodule was only rib shadow.  We will recheck. CXR 06/14/21-  IMPRESSION: Negative for acute cardiopulmonary disease.  No nodule identified on the current plain film, which is favored previously to have been related to the rib    Review of Systems + = Positive Constitutional: Negative for fever and unexpected weight change.  HENT: Negative for congestion, dental problem, ear pain, nosebleeds,  postnasal drip, rhinorrhea, sinus pressure, sneezing, sore throat and trouble swallowing.   Eyes: Negative for redness and itching.  Respiratory: Negative for cough, chest tightness, shortness of breath and wheezing.   Cardiovascular: Negative for palpitations and leg swelling.  Gastrointestinal: Negative for nausea and vomiting.  Genitourinary: Negative for dysuria.  Musculoskeletal: Negative for joint swelling.  Skin: Negative for rash.  Neurological: Negative for headaches. + falls Hematological: Does not bruise/bleed easily.  Psychiatric/Behavioral: Negative for dysphoric mood. The patient is not nervous/anxious.    Objective:  OBJ- Physical Exam General- Alert, Oriented, Affect-appropriate/ pleasant , Distress- none acute, + obese Skin- rash-none, lesions- none, excoriation- none Lymphadenopathy- none Head- atraumatic            Eyes- +squint R eye closed            Ears- Hearing, canals-normal            Nose- Clear, no-Septal dev, mucus, polyps, erosion, perforation             Throat- Mallampati III , mucosa clear , drainage- none, tonsils- atrophic Neck- flexible , trachea midline, no stridor , thyroid nl, carotid no bruit Chest - symmetrical excursion , unlabored           Heart/CV- RRR , no murmur , no gallop  , no rub, nl s1 s2                           - JVD- none , edema- none, stasis changes- none, varices- none           Lung- clear to P&A,  wheeze- none, cough- none , dullness-none, rub- none           Chest wall-  Abd-  Br/ Gen/ Rectal- Not done, not indicated Extrem- cyanosis- none, clubbing, none, atrophy- none, strength- nl, +cane Neuro- grossly intact to observation    Assessment & Plan:

## 2022-06-15 ENCOUNTER — Ambulatory Visit: Payer: Medicare Other | Admitting: Internal Medicine

## 2022-06-15 ENCOUNTER — Other Ambulatory Visit: Payer: Self-pay | Admitting: Physical Medicine and Rehabilitation

## 2022-06-15 ENCOUNTER — Encounter: Payer: Self-pay | Admitting: Internal Medicine

## 2022-06-15 VITALS — BP 142/68 | HR 90 | Ht 71.0 in | Wt 269.0 lb

## 2022-06-15 DIAGNOSIS — R918 Other nonspecific abnormal finding of lung field: Secondary | ICD-10-CM | POA: Diagnosis not present

## 2022-06-15 DIAGNOSIS — G4733 Obstructive sleep apnea (adult) (pediatric): Secondary | ICD-10-CM

## 2022-06-15 NOTE — Patient Instructions (Signed)
Order- schedule home sleep test     dx OSA  Please call us about 2 weeks after your sleep tet for result and recommendations. We can decide then if your apnea is so mild that it would be ok to stop using CPAP.

## 2022-06-22 ENCOUNTER — Encounter
Payer: Medicare Other | Attending: Physical Medicine and Rehabilitation | Admitting: Physical Medicine and Rehabilitation

## 2022-06-22 VITALS — BP 122/68 | HR 84 | Ht 71.0 in | Wt 269.6 lb

## 2022-06-22 DIAGNOSIS — G4701 Insomnia due to medical condition: Secondary | ICD-10-CM | POA: Diagnosis not present

## 2022-06-22 DIAGNOSIS — M47819 Spondylosis without myelopathy or radiculopathy, site unspecified: Secondary | ICD-10-CM | POA: Insufficient documentation

## 2022-06-22 MED ORDER — TOPIRAMATE 25 MG PO TABS: 25.0000 mg | ORAL_TABLET | Freq: Every day | ORAL | 3 refills | Status: DC

## 2022-06-22 NOTE — Progress Notes (Signed)
Subjective:    Patient ID: [name], male    DOB: 09-Dec-1940, 81 y.o.   MRN: [id number]   HPI  Male with pmh of OSA, CKD, HTN, DM type 1 who presents for follow-up of diabetic peripheral neuropathy presents for follow-up of low back pain > neck pain.   On first visit, pt complained of b/l L>R back pain.  Started ~>20 years ago.  No inciting event.  Sitting/laying improve the pain.  Standing exacerbates the pain.  Sharp, burning pain.  Radiates to left posterior thigh.  Intermittent.  He has associated numbness/tingling.  He only tried ASA, which helps some.  Pain limits from doing ADLs and fishing.  He denies falls.  Qutenza patches helped somewhat, but not enough.  His insurance did not cover it and he was charged $700 Voltaren cream helps the back pain but does not stop it When he fishes he feels pain from the [place] made it worse -He takes advil and this helps a little.  Sometimes it moves into his legs.   Last clinic visit on 10/21/2020.  He had trigger point injections at that time.  Since that time, patient states he had great benefit for about a week.  He uses a TENS with benefit. Patient states he feels he is getting weaker. He is taking Baclofen 1-2/week.  Sleep has improved. He is taking Elavil 25.  He states he believe he lost weight, but not exercising. Denies falls. Lightheadedness has improved.    He does not take any other medications for his pain and does not desire to. He requires a cane for ambulation and has a handicap placard.   Blood pressure is better controlled today at 120/74  He has been to multiple speciialists and was told he will not be able to see from this eye after her had a fall on this eye Has not been using SPRINT PNS but it did help when he used it.  Has been having headaches.  -he is ready to try Qutenza today -he has numbness and tingling from his shins down to his feet and it is constant and feels uncomfortable -his back pain has also been  severe -wife accompanies him today -he has decreased sensation in both feet   Pain Inventory Average Pain 5 Pain Right Now 3 My pain is intermittent, sharp, burning, stabbing and tingling  In the last 24 hours, has pain interfered with the following? General activity 4 Relation with others 9 Enjoyment of life 9 What TIME of day is your pain at its worst?  all Sleep (in general) Good  Pain is worse with: walking and standing Pain improves with: rest and TENS Relief from Meds:  n/a'     Family History  Problem Relation Age of Onset   Colon cancer Brother 14   Colon polyps Brother    Lung cancer Brother    Other Mother        MVA, deceased 29s   Healthy Daughter    Healthy Son    Rectal cancer Neg Hx    Stomach cancer Neg Hx    Social History   Socioeconomic History   Marital status: Married    Spouse name: Not on file   Number of children: Not on file   Years of education: Not on file   Highest education level: Not on file  Occupational History    Employer: [employer]  Tobacco Use   Smoking status: Former    Packs/day: 2.00  Years: 31.00    Total pack years: 62.00    Types: Cigarettes    Start date: 95    Quit date: 10/23/1982    Years since quitting: 39.6   Smokeless tobacco: Never  Vaping Use   Vaping Use: Never used  Substance and Sexual Activity   Alcohol use: No    Alcohol/week: 0.0 standard drinks of alcohol   Drug use: No   Sexual activity: Not on file  Other Topics Concern   Not on file  Social History Narrative   Lives with wife in a one story home.  Has a son and a daughter.     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     Education: 2 years of college.   Social Determinants of Health   Financial Resource Strain: Low Risk  (01/10/2022)   Overall Financial Resource Strain (CARDIA)    Difficulty of Paying Living Expenses: Not hard at all  Food Insecurity: No Food Insecurity (01/10/2022)   Hunger Vital Sign     Worried About Running Out of Food in the Last Year: Never true    Ran Out of Food in the Last Year: Never true  Transportation Needs: No Transportation Needs (01/10/2022)   PRAPARE - Hydrologist (Medical): No    Lack of Transportation (Non-Medical): No  Physical Activity: Inactive (01/10/2022)   Exercise Vital Sign    Days of Exercise per Week: 0 days    Minutes of Exercise per Session: 0 min  Stress: No Stress Concern Present (01/10/2022)   [place]    Feeling of Stress : Not at all  Social Connections: [place] (01/10/2022)   Social Connection and Isolation Panel [NHANES]    Frequency of Communication with Friends and Family: Twice a week    Frequency of Social Gatherings with Friends and Family: Twice a week    Attends Religious Services: 1 to 4 times per year    Active Member of [employer] or Organizations: Yes    Attends Archivist Meetings: 1 to 4 times per year    Marital Status: Married   Past Surgical History:  Procedure Laterality Date   Abdominal US  09/1997   arm fracture Left 1958   with hardware   Colon cancer screening     COLONOSCOPY  08/18/2004   diverticulitis   COLONOSCOPY  09/24/2009   ELECTROCARDIOGRAM  02/2006   FLEXIBLE SIGMOIDOSCOPY  03/04/2001   polyps, anal fissure   GANGLION CYST EXCISION Left 1980   HIP PINNING,CANNULATED Right 04/25/2021   Procedure: CANNULATED HIP PINNING;  Surgeon: Rod Can, MD;  Location: [facility];  Service: Orthopedics;  Laterality: Right;   Lower Arterial  04/13/2004   POLYPECTOMY     Rest Cardiolite  03/19/2003   Past Medical History:  Diagnosis Date   Allergy    Anal fissure    Anemia, unspecified    Arthritis    Spinal OA   BACK PAIN, LUMBAR 10/23/2007   CARDIAC MURMUR, SYSTOLIC 12/27/1060   DIABETES MELLITUS, TYPE I 6/94/8546   DIASTOLIC DYSFUNCTION 2/70/3500   DM type 1 with diabetic peripheral neuropathy  ([place])    Duodenitis    Edema 05/12/2008   Esophageal stricture    GERD (gastroesophageal reflux disease)    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   Gynecomastia    Hepatic steatosis    HYPERLIPIDEMIA 05/20/2007   Hypertension    Hypogonadism male  Morbid obesity ([place]) 09/10/2009   OBSTRUCTIVE SLEEP APNEA 11/04/2007   Personal history of colonic polyps    RENAL INSUFFICIENCY 05/12/2008   Sleep apnea    uses cpap   Squamous cell carcinoma of skin 02/16/2020   left lower leg, anterior-cx3,80f   Urolithiasis    BP 122/68   Pulse 84   Ht '5\' 11"'$  (1.803 m)   Wt 269 lb 9.6 oz (122.3 kg)   SpO2 96%   BMI 37.60 kg/m   Opioid Risk Score:   Fall Risk Score:  `1  Depression screen PHQ 2/9     05/24/2022   10:30 AM 02/16/2022    9:48 AM 01/10/2022    1:43 PM 01/10/2022    1:42 PM 01/10/2022    1:40 PM 12/27/2021    3:09 PM 11/15/2021    4:04 PM  Depression screen PHQ 2/9  Decreased Interest 0 0 0 0 0 0 0  Down, Depressed, Hopeless 0 0 0 0 0 0 0  PHQ - 2 Score 0 0 0 0 0 0 0    Review of Systems  HENT: Negative.    Eyes: Negative.   Respiratory:  Positive for apnea and shortness of breath.   Cardiovascular: Negative.   Gastrointestinal: Negative.   Endocrine:       Diabetic High blood sugar  Genitourinary: Negative.           Musculoskeletal:  Positive for arthralgias, back pain, gait problem, myalgias and neck pain.  Skin: Negative.   Allergic/Immunologic: Negative.   Neurological:  Positive for weakness and numbness.       Tingling  Hematological: Negative.   All other systems reviewed and are negative.     Objective:   Physical Exam  Constitutional: No distress . Vital signs reviewed. BMI 36.82, weight 264 lbs HENT: Normocephalic.  Atraumatic. Eyes: EOMI. No discharge. Cardiovascular: No JVD.   Respiratory: Normal effort.  No stridor.   GI: Non-distended.   Skin: No open lesions on feet Psych: Normal mood.  Normal behavior. Musc: TTP lumbar spinous process Musc: Gait: Mildly  antalgic. Ambulating with cane at all times.  Neuro: Alert  HOH             Strength          5/5 in all LE myotomes Decreased sensation in bilateral feet    Assessment & Plan:  Male with pmh of OSA, CKD, HTN, DM type 1 with peripheral neuropathy presents for follow up of low back pain > neck pain.     1. Chronic mechanical low back pain             MRIs C,L-spine early 2016 suggesting multilevel spondylosis with mild b/l multilevel foraminal narrowing C4-C7 and shallow disc bulge at L5-S1 without central canal or foraminal stenosis.              Avoid NSAIDs due to CKD, discussed with patient since he takes Advil  Continue voltaren gel, advised that he could use up to 4 times per day.              Unable to tolerate Gabapentin, Lyrica             Cont HEP  Continue TENS             Aquatic therapy, not going anymore, states ineffective             Cont tylenol  Robaxin 500 TID PRN, changed to Baclofen 10 TID PRN  due to change insurance, continue   Continue Cymbalta '60mg'$              Cont back brace during periods of excessive activity, reminded to avoid prolonged use  Continue Lidoderm OTC, reminded              Encouraged rest breaks             Acknowledges he can do more if he make a conscious effort             PT completed    Pain over spinous process where clothing is resulting increased pressure.  Encourage change in clothing.   Sprint PNS removed today.   -Provided with a pain relief journal and discussed that it contains foods and lifestyle tips to naturally help to improve pain. Discussed that these lifestyle strategies are also very good for health unlike some medications which can have negative side effects. Discussed that the act of keeping a journal can be therapeutic and helpful to realize patterns what helps to trigger and alleviate pain.  Discussed can try Qutenza for lower back pain as well    2. Sacroiliitis             See #1             Continue brace              Not main pain generator at present, does not wish to proceed with injection at this time, particularly due to DM   3. Sleep disturbance             Recently new mattress             Continue CPAP             Improved  Insomnia: -Try to go outside near sunrise -Get exercise during the day.  -Turn off all devices an hour before bedtime.  -Teas that can benefit: chamomile, valerian root, Brahmi (Bacopa) -Can consider over the counter melatonin, magnesium, and/or L-theanine. Melatonin is an anti-oxidant with multiple health benefits. Magnesium is involved in greater than 300 enzymatic reactions in the body and most of [place] are deficient as our soil is often depleted. There are 7 different types of magnesium- Bioptemizer's is a supplement with all 7 types, and each has unique benefits. Magnesium can also help with constipation and anxiety.  -Pistachios naturally increase the production of melatonin -Cozy Earth bamboo bed sheets are free from toxic chemicals.  -Tart cherry juice or a tart cherry supplement can improve sleep and soreness post-workout     4. Myalgia  Will schedule for trigger point injections   5. Diabetic neuropathy             See #1, #3             Side effects with elavil to '50mg'$  qhs, continue '25mg'$  educated on signs/symptoms of seratonin syndrome previously  Refilled Cymbalta.  -Discussed current symptoms of pain and history of pain.  -Discussed benefits of exercise in reducing pain. -Discussed following foods that may reduce pain: 1) Ginger (especially studied for arthritis)- reduce leukotriene production to decrease inflammation 2) Blueberries- high in phytonutrients that decrease inflammation 3) Salmon- marine omega-3s reduce joint swelling and pain 4) Pumpkin seeds- reduce inflammation 5) dark chocolate- reduces inflammation 6) turmeric- reduces inflammation 7) tart cherries - reduce pain and stiffness 8) extra virgin olive oil - its compound olecanthal helps to  block prostaglandins  9) chili peppers- can  be eaten or applied topically via capsaicin 10) mint- helpful for headache, muscle aches, joint pain, and itching 11) garlic- reduces inflammation  Link to further information on diet for chronic pain: http://www.randall.com/   -Discussed Qutenza as an option for neuropathic pain control. Discussed that this is a capsaicin patch, stronger than capsaicin cream. Discussed that it is currently approved for diabetic peripheral neuropathy and post-herpetic neuralgia, but that it has also shown benefit in treating other forms of neuropathy. Provided patient with link to site to learn more about the patch: CinemaBonus.fr. Discussed that the patch would be placed in office and benefits usually last 3 months. Discussed that unintended exposure to capsaicin can cause severe irritation of eyes, mucous membranes, respiratory tract, and skin, but that Qutenza is a local treatment and does not have the systemic side effects of other nerve medications. Discussed that there may be pain, itching, erythema, and decreased sensory function associated with the application of Qutenza. Side effects usually subside within 1 week. A cold pack of analgesic medications can help with these side effects. Blood pressure can also be increased due to pain associated with administration of the patch.    6. Morbid obesity             Not interested in seeing dietitian at this time  Encouraged activity   No attempts at weight loss  Discussed Topamax.    7. Gait abnormality             Completed therapies, cont HEP  Continue cane for safety   8. Nonhealing ulcer             Left lateral foot, improving  9. Lightheadedness  Encouraged BP monitoring at home

## 2022-06-22 NOTE — Patient Instructions (Signed)
Foods that may reduce pain: 1) Ginger (especially studied for arthritis)- reduce leukotriene production to decrease inflammation 2) Blueberries- high in phytonutrients that decrease inflammation 3) Salmon- marine omega-3s reduce joint swelling and pain 4) Pumpkin seeds- reduce inflammation 5) dark chocolate- reduces inflammation 6) turmeric- reduces inflammation 7) tart cherries - reduce pain and stiffness 8) extra virgin olive oil - its compound olecanthal helps to block prostaglandins  9) chili peppers- can be eaten or applied topically via capsaicin 10) mint- helpful for headache, muscle aches, joint pain, and itching 11) garlic- reduces inflammation  Link to further information on diet for chronic pain: https://www.practicalpainmanagement.com/treatments/complementary/diet-patients-chronic-pain    Insomnia: -Try to go outside near sunrise -Get exercise during the day.  -Turn off all devices an hour before bedtime.  -Teas that can benefit: chamomile, valerian root, Brahmi (Bacopa) -Can consider over the counter melatonin, magnesium, and/or L-theanine. Melatonin is an anti-oxidant with multiple health benefits. Magnesium is involved in greater than 300 enzymatic reactions in the body and most of us are deficient as our soil is often depleted. There are 7 different types of magnesium- Bioptemizer's is a supplement with all 7 types, and each has unique benefits. Magnesium can also help with constipation and anxiety.  -Pistachios naturally increase the production of melatonin -Cozy Earth bamboo bed sheets are free from toxic chemicals.  -Tart cherry juice or a tart cherry supplement can improve sleep and soreness post-workout   

## 2022-06-28 NOTE — Assessment & Plan Note (Signed)
Probably benign.  We will track this conservatively with updated CXR

## 2022-06-28 NOTE — Assessment & Plan Note (Signed)
Going to update sleep study in hopes he may no longer need CPAP enough to bother with. Plan-schedule sleep study

## 2022-07-04 ENCOUNTER — Ambulatory Visit: Payer: Medicare Other | Admitting: Orthopaedic Surgery

## 2022-07-04 ENCOUNTER — Ambulatory Visit (INDEPENDENT_AMBULATORY_CARE_PROVIDER_SITE_OTHER): Payer: Medicare Other

## 2022-07-04 DIAGNOSIS — M1712 Unilateral primary osteoarthritis, left knee: Secondary | ICD-10-CM

## 2022-07-04 DIAGNOSIS — M79605 Pain in left leg: Secondary | ICD-10-CM

## 2022-07-04 MED ORDER — TIZANIDINE HCL 4 MG PO TABS: 4.0000 mg | ORAL_TABLET | Freq: Two times a day (BID) | ORAL | 0 refills | Status: DC | PRN

## 2022-07-04 MED ORDER — PREDNISONE 5 MG (21) PO TBPK: ORAL_TABLET | ORAL | 0 refills | Status: DC
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
## 2022-08-04 ENCOUNTER — Ambulatory Visit: Payer: Medicare Other

## 2022-08-10 ENCOUNTER — Encounter
Payer: Medicare Other | Attending: Physical Medicine and Rehabilitation | Admitting: Physical Medicine and Rehabilitation

## 2022-08-10 VITALS — BP 142/75 | HR 86 | Ht 71.0 in | Wt 273.0 lb

## 2022-08-10 DIAGNOSIS — E1142 Type 2 diabetes mellitus with diabetic polyneuropathy: Secondary | ICD-10-CM | POA: Diagnosis present

## 2022-08-10 DIAGNOSIS — M47819 Spondylosis without myelopathy or radiculopathy, site unspecified: Secondary | ICD-10-CM | POA: Diagnosis present

## 2022-08-10 NOTE — Progress Notes (Signed)
Subjective:    Patient ID: [name], male    DOB: 11-Sep-1941, 81 y.o.   MRN: [id number]   HPI  Male with pmh of OSA, CKD, HTN, DM type 1 who presents for f/u of diabetic peripheral neuropathy presents for follow-up of low back pain > neck pain.   On first visit, pt complained of b/l L>R back pain.  Started ~>20 years ago.  No inciting event.  Sitting/laying improve the pain.  Standing exacerbates the pain.  Sharp, burning pain.  Radiates to left posterior thigh.  Intermittent.  He has associated numbness/tingling.  He only tried ASA, which helps some.  Pain limits from doing ADLs and fishing.  He denies falls.  Qutenza patches helped somewhat, but not enough for his feet, they did help more for his back last time His insurance did not cover it and he was charged $700 the firs time -back is worst today, he would like to try Qutenza patch here -he felt good after Qutenza administration today -he usually snacks at night on potato chips after dinner Voltaren cream helps the back pain but does not stop it When he fishes he feels pain from the jolting- fishing made it worse -He takes advil and this helps a little.  Sometimes it moves into his legs.   Last clinic visit on 10/21/2020.  He had trigger point injections at that time.  Since that time, patient states he had great benefit for about a week.  He uses a TENS with benefit. Patient states he feels he is getting weaker. He is taking Baclofen 1-2/week.  Sleep has improved. He is taking Elavil 25.  He states he believe he lost weight, but not exercising. Denies falls. Lightheadedness has improved.    He does not take any other medications for his pain and does not desire to. He requires a cane for ambulation and has a handicap placard.   Blood pressure is better controlled today at 120/74  He has been to multiple speciialists and was told he will not be able to see from this eye after her had a fall on this eye Has not been using SPRINT PNS  but it did help when he used it.  Has been having headaches.  -he is ready to try Qutenza today -he has numbness and tingling from his shins down to his feet and it is constant and feels uncomfortable -his back pain has also been severe -wife accompanies him today -he has decreased sensation in both feet   Pain Inventory Average Pain 5 Pain Right Now 3 My pain is intermittent, sharp, burning, stabbing and tingling  In the last 24 hours, has pain interfered with the following? General activity 4 Relation with others 9 Enjoyment of life 9 What TIME of day is your pain at its worst?  all Sleep (in general) Good  Pain is worse with: walking and standing Pain improves with: rest and TENS Relief from Meds:  n/a'     Family History  Problem Relation Age of Onset   Colon cancer Brother 48   Colon polyps Brother    Lung cancer Brother    Other Mother        MVA, deceased 67s   Healthy Daughter    Healthy Son    Rectal cancer Neg Hx    Stomach cancer Neg Hx    Social History   Socioeconomic History   Marital status: Married    Spouse name: Not on file   Number  of children: Not on file   Years of education: Not on file   Highest education level: Not on file  Occupational History    Employer: [employer]  Tobacco Use   Smoking status: Former    Packs/day: 2.00    Years: 31.00    Total pack years: 62.00    Types: Cigarettes    Start date: 42    Quit date: 10/23/1982    Years since quitting: 39.8   Smokeless tobacco: Never  Vaping Use   Vaping Use: Never used  Substance and Sexual Activity   Alcohol use: No    Alcohol/week: 0.0 standard drinks of alcohol   Drug use: No   Sexual activity: Not on file  Other Topics Concern   Not on file  Social History Narrative   Lives with wife in a one story home.  Has a son and a daughter.     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     Education: 2 years of college.   Social Determinants of Health    Financial Resource Strain: Low Risk  (01/10/2022)   Overall Financial Resource Strain (CARDIA)    Difficulty of Paying Living Expenses: Not hard at all  Food Insecurity: No Food Insecurity (01/10/2022)   Hunger Vital Sign    Worried About Running Out of Food in the Last Year: Never true    Ran Out of Food in the Last Year: Never true  Transportation Needs: No Transportation Needs (01/10/2022)   PRAPARE - Hydrologist (Medical): No    Lack of Transportation (Non-Medical): No  Physical Activity: Inactive (01/10/2022)   Exercise Vital Sign    Days of Exercise per Week: 0 days    Minutes of Exercise per Session: 0 min  Stress: No Stress Concern Present (01/10/2022)   [place]    Feeling of Stress : Not at all  Social Connections: [place] (01/10/2022)   Social Connection and Isolation Panel [NHANES]    Frequency of Communication with Friends and Family: Twice a week    Frequency of Social Gatherings with Friends and Family: Twice a week    Attends Religious Services: 1 to 4 times per year    Active Member of [employer] or Organizations: Yes    Attends Archivist Meetings: 1 to 4 times per year    Marital Status: Married   Past Surgical History:  Procedure Laterality Date   Abdominal US  09/1997   arm fracture Left 1958   with hardware   Colon cancer screening     COLONOSCOPY  08/18/2004   diverticulitis   COLONOSCOPY  09/24/2009   ELECTROCARDIOGRAM  02/2006   FLEXIBLE SIGMOIDOSCOPY  03/04/2001   polyps, anal fissure   GANGLION CYST EXCISION Left 1980   HIP PINNING,CANNULATED Right 04/25/2021   Procedure: CANNULATED HIP PINNING;  Surgeon: Rod Can, MD;  Location: [facility];  Service: Orthopedics;  Laterality: Right;   Lower Arterial  04/13/2004   POLYPECTOMY     Rest Cardiolite  03/19/2003   Past Medical History:  Diagnosis Date   Allergy    Anal fissure    Anemia,  unspecified    Arthritis    Spinal OA   BACK PAIN, LUMBAR 10/23/2007   CARDIAC MURMUR, SYSTOLIC 06/25/8181   DIABETES MELLITUS, TYPE I 9/93/7169   DIASTOLIC DYSFUNCTION 6/78/9381   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[place])  Duodenitis    Edema 05/12/2008   Esophageal stricture    GERD (gastroesophageal reflux disease)    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  Opioid Risk Score:   Fall Risk Score:  `1  Depression screen PHQ 2/9     05/24/2022   10:30 AM 02/16/2022    9:48 AM 01/10/2022    1:43 PM 01/10/2022    1:42 PM 01/10/2022    1:40 PM 12/27/2021    3:09 PM 11/15/2021    4:04 PM  Depression screen PHQ 2/9  Decreased Interest 0 0 0 0 0 0 0  Down, Depressed, Hopeless 0 0 0 0 0 0 0  PHQ - 2 Score 0 0 0 0 0 0 0    Review of Systems  HENT: Negative.    Eyes: Negative.   Respiratory:  Positive for apnea and shortness of breath.   Cardiovascular: Negative.   Gastrointestinal: Negative.   Endocrine:       Diabetic High blood sugar  Genitourinary: Negative.           Musculoskeletal:  Positive for arthralgias, back pain, gait problem, myalgias and neck pain.  Skin: Negative.   Allergic/Immunologic: Negative.   Neurological:  Positive for weakness and numbness.       Tingling  Hematological: Negative.   All other systems reviewed and are negative.     Objective:   Physical Exam  Constitutional: No distress . Vital signs reviewed. BMI 38.08, weight 273 lbs Gen: no distress, normal appearing HEENT: oral mucosa pink and moist, NCAT Cardio: Reg rate Chest: normal effort,  normal rate of breathing Abd: soft, non-distended Ext: no edema Psych: pleasant, normal affect Skin: intact Musc: TTP lumbar spinous process Musc: Gait: Mildly antalgic. Ambulating with cane at all times.  Neuro: Alert  HOH             Strength          5/5 in all LE myotomes Decreased sensation in bilateral feet    Assessment & Plan:  Male with pmh of OSA, CKD, HTN, DM type 1 with peripheral neuropathy presents for follow up of low back pain > neck pain.     1. Chronic mechanical low back pain             MRIs C,L-spine early 2016 suggesting multilevel spondylosis with mild b/l multilevel foraminal narrowing C4-C7 and shallow disc bulge at L5-S1 without central canal or foraminal stenosis.              Avoid NSAIDs due to CKD, discussed with patient since he takes Advil  Continue voltaren gel, advised that he could use up to 4 times per day.              Unable to tolerate Gabapentin, Lyrica             Cont HEP  Continue TENS             Aquatic therapy, not going anymore, states ineffective  Cont tylenol  Robaxin 500 TID PRN, changed to Baclofen 10 TID PRN due to change insurance, continue   Continue Cymbalta '60mg'$              Cont back brace during periods of excessive activity, reminded to avoid prolonged use  Continue Lidoderm OTC, reminded              Encouraged rest breaks             Acknowledges he can do more if he make a conscious effort             PT completed    Pain over spinous process where clothing is resulting increased pressure.  Encourage change in clothing.   Sprint PNS removed today.   -Provided with a pain relief journal and discussed that it contains foods and lifestyle tips to naturally help to improve pain. Discussed that these lifestyle strategies are also very good for health unlike some medications which can have negative side effects. Discussed that the act of keeping a journal can be therapeutic and helpful to realize patterns what helps  to trigger and alleviate pain.  -Discussed Qutenza as an option for neuropathic pain control. Discussed that this is a capsaicin patch, stronger than capsaicin cream. Discussed that it is currently approved for diabetic peripheral neuropathy and post-herpetic neuralgia, but that it has also shown benefit in treating other forms of neuropathy. Provided patient with link to site to learn more about the patch: [employer]. Discussed that the patch would be placed in office and benefits usually last 3 months. Discussed that unintended exposure to capsaicin can cause severe irritation of eyes, mucous membranes, respiratory tract, and skin, but that Qutenza is a local treatment and does not have the systemic side effects of other nerve medications. Discussed that there may be pain, itching, erythema, and decreased sensory function associated with the application of Qutenza. Side effects usually subside within 1 week. A cold pack of analgesic medications can help with these side effects. Blood pressure can also be increased due to pain associated with administration of the patch.  1 patch of Qutenza was applied to the area of pain. Ice packs were applied during the procedure to ensure patient comfort. Blood pressure was monitored every 15 minutes. The patient tolerated the procedure well. Post-procedure instructions were given and follow-up has been scheduled.     2. Sacroiliitis             See #1             Continue brace             Not main pain generator at present, does not wish to proceed with injection at this time, particularly due to DM   3. Sleep disturbance             Recently new mattress             Continue CPAP             Improved  Insomnia: -Try to go outside near sunrise -Get exercise during the day.  -Turn off all devices an hour before bedtime.  -Teas that can benefit: chamomile, valerian root, Brahmi (Bacopa) -Can consider over the counter melatonin, magnesium, and/or  L-theanine. Melatonin is an anti-oxidant with multiple health benefits. Magnesium is involved in greater than 300 enzymatic reactions in the body and most of [place] are deficient as our soil is often depleted. There are 7 different types of magnesium-  Bioptemizer's is a supplement with all 7 types, and each has unique benefits. Magnesium can also help with constipation and anxiety.  -Pistachios naturally increase the production of melatonin -[employer] bamboo bed sheets are free from toxic chemicals.  -Tart cherry juice or a tart cherry supplement can improve sleep and soreness post-workout     4. Myalgia  Will schedule for trigger point injections   5. Diabetic neuropathy             See #1, #3             Side effects with elavil to '50mg'$  qhs, continue '25mg'$  educated on signs/symptoms of seratonin syndrome previously  Refilled Cymbalta.   Recommended no snacking after dinner -Discussed current symptoms of pain and history of pain.  -Discussed benefits of exercise in reducing pain. -Discussed following foods that may reduce pain: 1) Ginger (especially studied for arthritis)- reduce leukotriene production to decrease inflammation 2) Blueberries- high in phytonutrients that decrease inflammation 3) Salmon- marine omega-3s reduce joint swelling and pain 4) Pumpkin seeds- reduce inflammation 5) dark chocolate- reduces inflammation 6) turmeric- reduces inflammation 7) tart cherries - reduce pain and stiffness 8) extra virgin olive oil - its compound olecanthal helps to block prostaglandins  9) chili peppers- can be eaten or applied topically via capsaicin 10) mint- helpful for headache, muscle aches, joint pain, and itching 11) garlic- reduces inflammation  Link to further information on diet for chronic pain: http://www.randall.com/   -Discussed Qutenza as an option for neuropathic pain control. Discussed that this is a  capsaicin patch, stronger than capsaicin cream. Discussed that it is currently approved for diabetic peripheral neuropathy and post-herpetic neuralgia, but that it has also shown benefit in treating other forms of neuropathy. Provided patient with link to site to learn more about the patch: [employer]fr. Discussed that the patch would be placed in office and benefits usually last 3 months. Discussed that unintended exposure to capsaicin can cause severe irritation of eyes, mucous membranes, respiratory tract, and skin, but that Qutenza is a local treatment and does not have the systemic side effects of other nerve medications. Discussed that there may be pain, itching, erythema, and decreased sensory function associated with the application of Qutenza. Side effects usually subside within 1 week. A cold pack of analgesic medications can help with these side effects. Blood pressure can also be increased due to pain associated with administration of the patch.    6. Morbid obesity             Not interested in seeing dietitian at this time  Encouraged activity   No attempts at weight loss  Discussed Topamax.    7. Gait abnormality             Completed therapies, cont HEP  Continue cane for safety   8. Nonhealing ulcer             Left lateral foot, improving  9. Lightheadedness  Encouraged BP monitoring at home

## 2022-08-22 ENCOUNTER — Ambulatory Visit: Payer: Medicare Other | Attending: Physician Assistant | Admitting: Physical Therapy

## 2022-08-22 ENCOUNTER — Encounter: Payer: Self-pay | Admitting: Physical Therapy

## 2022-08-22 DIAGNOSIS — M6281 Muscle weakness (generalized): Secondary | ICD-10-CM | POA: Diagnosis present

## 2022-08-22 DIAGNOSIS — R278 Other lack of coordination: Secondary | ICD-10-CM | POA: Insufficient documentation

## 2022-08-22 DIAGNOSIS — R293 Abnormal posture: Secondary | ICD-10-CM | POA: Insufficient documentation

## 2022-08-22 DIAGNOSIS — R2681 Unsteadiness on feet: Secondary | ICD-10-CM | POA: Diagnosis present

## 2022-08-22 DIAGNOSIS — R262 Difficulty in walking, not elsewhere classified: Secondary | ICD-10-CM | POA: Insufficient documentation

## 2022-08-22 DIAGNOSIS — M25562 Pain in left knee: Secondary | ICD-10-CM | POA: Diagnosis present

## 2022-08-22 DIAGNOSIS — M79605 Pain in left leg: Secondary | ICD-10-CM | POA: Diagnosis not present
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
## 2022-08-23 ENCOUNTER — Ambulatory Visit: Payer: Medicare Other | Admitting: Endocrinology

## 2022-08-23 ENCOUNTER — Encounter: Payer: Self-pay | Admitting: Endocrinology

## 2022-08-23 VITALS — BP 120/70 | HR 81 | Ht 71.0 in | Wt 273.8 lb

## 2022-08-23 DIAGNOSIS — N1831 Chronic kidney disease, stage 3a: Secondary | ICD-10-CM

## 2022-08-23 DIAGNOSIS — E782 Mixed hyperlipidemia: Secondary | ICD-10-CM | POA: Diagnosis not present

## 2022-08-23 DIAGNOSIS — Z794 Long term (current) use of insulin: Secondary | ICD-10-CM

## 2022-08-23 DIAGNOSIS — E1165 Type 2 diabetes mellitus with hyperglycemia: Secondary | ICD-10-CM | POA: Diagnosis not present

## 2022-08-23 LAB — POCT GLYCOSYLATED HEMOGLOBIN (HGB A1C): Hemoglobin A1C: 6.8 % — AB (ref 4.0–5.6)

## 2022-08-23 NOTE — Progress Notes (Unsigned)
Patient ID: [name], male   DOB: 11/23/1940, 81 y.o.   MRN: [id number]           Reason for Appointment: Type II Diabetes follow-up   History of Present Illness   Diagnosis date: 1986  Previous history:  Insulin was started in 1995  A1c range in the last few years is: 6.8-8.4  Recent history:     Non-insulin hypoglycemic drugs: None     Insulin pump: T-slim control IQ        BASAL settings: 12-8 am = 1.65 8-12 noon= 1.8 Noon to 12 MN = 1.75 and carb ratio 1: 4  Carb 1:4 lunch and dinner Carb ratio 1:3 breakfast   Active insulin 3 hours, correction factor I: 60  Current self management, blood sugar patterns and problems identified:  A1c is back down to 6.8 His blood sugars are improving with adjusting his basal rates, previously only on 1.5 units/h at all times However blood sugars are frequently going up in the evenings with or without food and he is not adequately correcting the high readings He also has done better with keeping his control IQ on all the time Still has mild hypoglycemia overnight but not significant He is still not consistently taking his bolus before eating and this may cause higher readings Also he is likely not getting enough insulin if he is getting any higher fat meals Only once had an issue with the infusion set being occluded causing high sugars He thought he may have had diarrhea from [place] and is not taking it Also was recommended [place] but his nephrologist felt that he had type 1 diabetes and should not take it  Exercise: Minimal, limited by chronic back pain Diet management: Sometimes will have appropriate or that breakfast otherwise oatmeal     Hypoglycemia:  none   CGM data interpretation for 2 weeks  His target blood sugar has been set between 70-200  Average blood sugar is the lowest overnight and much improved compared to last visit but still mildly increased at all times above 150 average Blood sugars are frequently getting  higher after breakfast and dinner but variably after lunch Also Premeal blood sugars are generally in the upper 100s at lunch and dinner  POSTPRANDIAL readings periodically will go up excessively after all the meals as much is 300 However blood sugars appear to be generally persistently high in the evening after about 6 PM through 1 AM and evening blood sugars are within target range only 31% of the time No hypoglycemia except rarely early morning but not recently  Statistics:   CGM use % of time 100  2-week average/SD 188  Time in range       63%  % Time Above 200 36  % Time above 250   % Time Below 70 0      PRE-MEAL  overnight  mornings  afternoon  evening Overall  Glucose range:       Averages: 162 172  202 219    Previously  CGM use % of time 96  2-week average/SD 214/52  Time in range 70-200        46%  % Time Above 200 54  % Time above 250   % Time Below 70 0      PRE-MEAL  overnight  mornings  afternoon  evening Overall  Glucose range:       Averages:  204  209 227  216 214    Weight history:  Wt Readings from Last 3 Encounters:  08/23/22 273 lb 12.8 oz (124.2 kg)  08/10/22 273 lb (123.8 kg)  06/22/22 269 lb 9.6 oz (122.3 kg)            Diabetes labs:  Lab Results  Component Value Date   HGBA1C 6.8 (A) 08/23/2022   HGBA1C 7.6 (H) 05/18/2022   HGBA1C 6.6 (A) 02/22/2022   Lab Results  Component Value Date   MICROALBUR 4.3 (H) 08/24/2021   LDLCALC 66 02/28/2021   CREATININE 1.70 (H) 05/18/2022    No results found for: "FRUCTOSAMINE"   Allergies as of 08/23/2022       Reactions   Atorvastatin Calcium    Lipitor [atorvastatin] Other (See Comments)   unknown   Niacin    REACTION: Severe heartburn   Pioglitazone    REACTION: Edema        Medication List        Accurate as of August 23, 2022 11:59 PM. If you have any questions, ask your nurse or doctor.          Accu-Chek Aviva Plus w/Device Kit Use as directed   Accu-Chek  FastClix Lancets Misc USE TO CHECK BLOOD SUGAR UP TO 6 TIMES DAILY   alfuzosin 10 MG 24 hr tablet Commonly known as: UROXATRAL TAKE 1 TABLET BY MOUTH  DAILY WITH BREAKFAST   amitriptyline 50 MG tablet Commonly known as: ELAVIL TAKE ONE-HALF TABLET BY  MOUTH AT BEDTIME   CENTRUM SILVER PO Take 1 tablet by mouth daily.   cetirizine 10 MG tablet Commonly known as: ZYRTEC Take 10 mg by mouth daily as needed for allergies.   cyanocobalamin 1000 MCG tablet Commonly known as: VITAMIN B12 Take 1,000 mcg by mouth daily.   Dexcom G6 Sensor Misc Inject 1 Device into the skin as directed. Apply to skin SQ every 10 days   diclofenac Sodium 1 % Gel Commonly known as: Voltaren Apply 2 g topically 4 (four) times daily.   DULoxetine 60 MG capsule Commonly known as: CYMBALTA Take 1 capsule (60 mg total) by mouth daily.   Fish Oil 1200 MG Caps Take 1,200 mg by mouth daily.   fluticasone 50 MCG/ACT nasal spray Commonly known as: FLONASE Place 1 spray into the nose daily as needed for allergies.   folic acid 1 MG tablet Commonly known as: FOLVITE Take 1 mg by mouth daily.   furosemide 20 MG tablet Commonly known as: LASIX TAKE 1 TABLET BY MOUTH  DAILY   glucose blood test strip Commonly known as: Accu-Chek Aviva Plus USE TO TEST BLOOD SUGAR 6  TIMES PER DAY; E11.9   insulin lispro 100 UNIT/ML injection Commonly known as: HumaLOG INJECT SUBCUTANEOUSLY VIA  INSULIN PUMP TOTAL OF 120  UNITS PER DAY   losartan 25 MG tablet Commonly known as: COZAAR Take 12.5 mg by mouth daily.   omeprazole 20 MG capsule Commonly known as: PRILOSEC Take 1 capsule (20 mg total) by mouth daily as needed.   predniSONE 5 MG (21) Tbpk tablet Commonly known as: STERAPRED UNI-PAK 21 TAB Take as directed   rosuvastatin 40 MG tablet Commonly known as: CRESTOR TAKE 1 TABLET BY MOUTH AT  BEDTIME   Semaglutide (1 MG/DOSE) 4 MG/3ML Sopn Inject 1 mg as directed once a week.   T:slim Insulin  Cartridge 77m Misc Use with insulin pump, fill once every 2 days.   AutoSoft XC Infusion Set Misc Inject 1 Act into the skin every other day. Use with 940mcannula  and 23 inch tubing. *5 boxes (90 day supply)   tiZANidine 4 MG tablet Commonly known as: Zanaflex Take 1 tablet (4 mg total) by mouth 2 (two) times daily as needed for muscle spasms.   topiramate 25 MG tablet Commonly known as: Topamax Take 1 tablet (25 mg total) by mouth at bedtime.        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[place])    Duodenitis    Edema 05/12/2008   Esophageal stricture    GERD (gastroesophageal reflux disease)    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   Gynecomastia    Hepatic steatosis    HYPERLIPIDEMIA 05/20/2007   Hypertension    Hypogonadism male    Morbid obesity ([place]) 09/10/2009   OBSTRUCTIVE SLEEP APNEA 11/04/2007   Personal history of colonic polyps    RENAL INSUFFICIENCY 05/12/2008   Sleep apnea    uses cpap   Squamous cell carcinoma of skin 02/16/2020   left lower leg, anterior-cx3,92f   Urolithiasis     Past Surgical History:  Procedure Laterality Date   Abdominal UKorea 09/1997   arm fracture Left 1958   with hardware   Colon cancer screening     COLONOSCOPY  08/18/2004   diverticulitis   COLONOSCOPY  09/24/2009   ELECTROCARDIOGRAM  02/2006   FLEXIBLE SIGMOIDOSCOPY  03/04/2001   polyps, anal fissure   GANGLION CYST EXCISION Left 1980   HIP PINNING,CANNULATED Right 04/25/2021   Procedure: CANNULATED HIP PINNING;  Surgeon:  SRod Can MD;  Location: [facility];  Service: Orthopedics;  Laterality: Right;   Lower Arterial  04/13/2004   POLYPECTOMY     Rest Cardiolite  03/19/2003    Family History  Problem Relation Age of Onset   Colon cancer Brother 565  Colon polyps Brother    Lung cancer Brother    Other Mother        MVA, deceased 560s  Healthy Daughter    Healthy Son    Rectal cancer Neg Hx    Stomach cancer Neg Hx     Social History:  reports that he quit smoking about 39 years ago. His smoking use included cigarettes. He started smoking about 68 years ago. He has a 62.00 pack-year smoking history. He has never used smokeless tobacco. He reports that he does not drink alcohol and does not use drugs.  Review of Systems:  Last diabetic eye exam date 2/23, reportedly no retinopathy  Last foot exam date: 7/22  History neuropathy: Present, treated with duloxetine and amitriptyline  Hypertension:   Treatment includes losartan 12.5 mg daily  BP Readings from Last 3 Encounters:  08/23/22 120/70  08/10/22 (!) 142/75  06/22/22 122/68    Lipid management: Has been treated with Crestor 40 mg daily by his PCP    Lab Results  Component Value Date   CHOL 135 02/28/2021   CHOL 116 07/10/2019   CHOL 127 05/09/2018  Lab Results  Component Value Date   HDL 33.60 (L) 02/28/2021   HDL 32.20 (L) 07/10/2019   HDL 34.30 (L) 05/09/2018   Lab Results  Component Value Date   LDLCALC 66 02/28/2021   LDLCALC 57 07/10/2019   LDLCALC 59 05/09/2018   Lab Results  Component Value Date   TRIG 174.0 (H) 02/28/2021   TRIG 137.0 07/10/2019   TRIG 170.0 (H) 05/09/2018   Lab Results  Component Value Date   CHOLHDL 4 02/28/2021   CHOLHDL 4 07/10/2019   CHOLHDL 4 05/09/2018   Lab Results  Component Value Date   LDLDIRECT 66.0 02/21/2022   LDLDIRECT 76.0 08/24/2021   LDLDIRECT 63.0 07/12/2020     Examination:   BP 120/70 (BP Location: Left Arm, Patient Position: Sitting, Cuff Size: Normal)    Pulse 81   Ht _0  (1.803 m)   Wt 273 lb 12.8 oz (124.2 kg)   SpO2 95%   BMI 38.19 kg/m   Body mass index is 38.19 kg/m.    ASSESSMENT/ PLAN:    Diabetes type 2 insulin-dependent:   Current regimen: T-insulin insulin pump with control IQ  See history of present illness for detailed discussion of current diabetes management, blood sugar patterns and problems identified  A1c is 6.8 and better  However his recent Dexcom is averaging 188 and nearly 40% readings over 200 Does likely need more basal and also boluses   Recommendations:  Increase basal rates at the time where he appears to be most hyperglycemic; will increase this stepwise to avoid hypoglycemia He was given instructions to increase his basal rates as follows  Settings for 12-8 am = 1.65 and carb ratio 1: 4 8-12 noon= 1.8 Carb ratio 1:3 Noon to 12 MN = 1.75 Carb 1:4  Also likely may benefit from changing his correction factor to 1: 40  Discussed importance of bolusing before starting to eat all meals and snacks  Also not to forget bolusing He can set up the reminder on his pump for at least dinnertime and this was done for 6-7 PM  For now will not change his basal rates in the morning but go up to 1.9 between 6 PM-12 AM and 1.70 overnight  May retry Ozempic 0.25 and after 3 to 4 weeks go back to 0.5 If having diarrhea or GI side effects will switch to [place]; discussed that since he has type 2 diabetes with renal disease he should be able to take [place] safely To call if having difficulty with control He should also be followed up by the diabetes educator to see how he is progressing and fine-tune his management  CHRONIC KIDNEY disease: Also followed by nephrologist  Patient Instructions  Ozempic 0.25 weekly for 3 weeks then 0.5 weekly   Elayne Snare 08/24/2022, 2:23 PM   Total visit time for evaluation and management, review of relevant records and labs, counseling = 45 minutes

## 2022-08-23 NOTE — Patient Instructions (Signed)
Ozempic 0.25 weekly for 3 weeks then 0.5 weekly

## 2022-08-25 ENCOUNTER — Encounter: Payer: Self-pay | Admitting: Endocrinology

## 2022-08-25 DIAGNOSIS — E1165 Type 2 diabetes mellitus with hyperglycemia: Secondary | ICD-10-CM

## 2022-08-28 ENCOUNTER — Ambulatory Visit: Payer: Medicare Other | Admitting: Podiatry

## 2022-08-28 DIAGNOSIS — Z794 Long term (current) use of insulin: Secondary | ICD-10-CM | POA: Diagnosis not present

## 2022-08-28 DIAGNOSIS — E1142 Type 2 diabetes mellitus with diabetic polyneuropathy: Secondary | ICD-10-CM

## 2022-08-28 DIAGNOSIS — E119 Type 2 diabetes mellitus without complications: Secondary | ICD-10-CM

## 2022-08-29 NOTE — Progress Notes (Signed)
  Subjective:  Patient ID: [name], male    DOB: Jun 23, 1941,  MRN: [id number]  Chief Complaint  Patient presents with   Diabetes    NP  diabetic foot exam    81 y.o. male presents with the above complaint. History confirmed with patient.  Complains of tingling and burning in toes occasionally, says his A1c is well controlled at 6.8%, it is usually under 7  Objective:  Physical Exam: warm, good capillary refill, no trophic changes or ulcerative lesions, normal DP and PT pulses, normal monofilament exam, and normal sensory exam.  Assessment:   1. Encounter for comprehensive diabetic foot examination, type 2 diabetes mellitus ([place])   2. Type 2 diabetes mellitus with diabetic polyneuropathy, with long-term current use of insulin ([place])      Plan:  Patient was evaluated and treated and all questions answered.  Patient educated on diabetes. Discussed proper diabetic foot care and discussed risks and complications of disease. Educated patient in depth on reasons to return to the office immediately should he/she discover anything concerning or new on the feet. All questions answered. Discussed proper shoes as well.   Suspect the tingling he is getting is early diabetic neuropathy is not consistent.  Discussed treatment of this with medication such as gabapentin if it worsens.  He will return to see me as needed for this otherwise return in 1 year for annual diabetic foot exam  Return in about 1 year (around 08/29/2023) for annual diabetic foot examination .

## 2022-08-30 ENCOUNTER — Ambulatory Visit: Payer: Medicare Other | Attending: Family Medicine | Admitting: Physical Therapy

## 2022-08-30 ENCOUNTER — Ambulatory Visit: Payer: Medicare Other | Admitting: Endocrinology

## 2022-08-30 DIAGNOSIS — R278 Other lack of coordination: Secondary | ICD-10-CM | POA: Insufficient documentation

## 2022-08-30 DIAGNOSIS — M25562 Pain in left knee: Secondary | ICD-10-CM | POA: Diagnosis present

## 2022-08-30 DIAGNOSIS — R262 Difficulty in walking, not elsewhere classified: Secondary | ICD-10-CM | POA: Diagnosis present

## 2022-08-30 DIAGNOSIS — R293 Abnormal posture: Secondary | ICD-10-CM | POA: Diagnosis not present

## 2022-08-30 DIAGNOSIS — M6281 Muscle weakness (generalized): Secondary | ICD-10-CM | POA: Insufficient documentation

## 2022-08-30 MED ORDER — SEMAGLUTIDE(0.25 OR 0.5MG/DOS) 2 MG/3ML ~~LOC~~ SOPN: PEN_INJECTOR | SUBCUTANEOUS | 1 refills | Status: DC
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
## 2022-08-31 ENCOUNTER — Encounter: Payer: Self-pay | Admitting: Endocrinology

## 2022-09-01 ENCOUNTER — Ambulatory Visit: Payer: Medicare Other | Admitting: Physical Therapy

## 2022-09-01 DIAGNOSIS — R293 Abnormal posture: Secondary | ICD-10-CM

## 2022-09-01 DIAGNOSIS — M6281 Muscle weakness (generalized): Secondary | ICD-10-CM

## 2022-09-01 DIAGNOSIS — R262 Difficulty in walking, not elsewhere classified: Secondary | ICD-10-CM
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
## 2022-09-04 ENCOUNTER — Ambulatory Visit: Payer: Medicare Other | Admitting: Physical Therapy

## 2022-09-06 ENCOUNTER — Ambulatory Visit: Payer: Medicare Other | Admitting: Physical Therapy

## 2022-09-11 ENCOUNTER — Ambulatory Visit: Payer: Medicare Other | Admitting: Physical Therapy

## 2022-09-13 ENCOUNTER — Ambulatory Visit: Payer: Medicare Other | Admitting: Physical Therapy

## 2022-09-18 ENCOUNTER — Ambulatory Visit: Payer: Medicare Other

## 2022-09-18 DIAGNOSIS — G4733 Obstructive sleep apnea (adult) (pediatric): Secondary | ICD-10-CM | POA: Diagnosis not present

## 2022-09-19 ENCOUNTER — Encounter
Payer: Medicare Other | Attending: Physical Medicine and Rehabilitation | Admitting: Physical Medicine and Rehabilitation

## 2022-09-19 ENCOUNTER — Encounter: Payer: Self-pay | Admitting: Physical Medicine and Rehabilitation

## 2022-09-19 VITALS — HR 65 | Ht 71.0 in | Wt 272.8 lb

## 2022-09-19 DIAGNOSIS — M792 Neuralgia and neuritis, unspecified: Secondary | ICD-10-CM | POA: Diagnosis not present

## 2022-09-19 DIAGNOSIS — M545 Low back pain, unspecified: Secondary | ICD-10-CM | POA: Diagnosis not present

## 2022-09-19 DIAGNOSIS — G8929 Other chronic pain: Secondary | ICD-10-CM | POA: Diagnosis not present

## 2022-09-19 NOTE — Progress Notes (Signed)
Subjective:    Patient ID: [name], male    DOB: 25-Oct-1940, 81 y.o.   MRN: [id number]   HPI  Male with pmh of OSA, CKD, HTN, DM type 1 who presents for f/u of diabetic peripheral neuropathy and low back pain > neck pain.   On first visit, pt complained of b/l L>R back pain.  Started ~>20 years ago.  No inciting event.  Sitting/laying improve the pain.  Standing exacerbates the pain.  Sharp, burning pain.  Radiates to left posterior thigh.  Intermittent.  He has associated numbness/tingling.  He only tried ASA, which helps some.  Pain limits from doing ADLs and fishing.  He denies falls.  Last Qutenza patch helped a lot for his back.  Qutenza patches helped on feet but not much [place] since he was ere last, landed on his knees He is doing well decreasing snacking after dinner 169 CBG right now.  -he usually snacks at night on potato chips after dinner -does not eat much more baked potatoes Voltaren cream helps the back pain but does not stop it When he fishes he feels pain from the jolting- fishing made it worse -He takes advil and this helps a little.  Sometimes it moves into his legs.   Last clinic visit on 10/21/2020.  He had trigger point injections at that time.  Since that time, patient states he had great benefit for about a week.  He uses a TENS with benefit. Patient states he feels he is getting weaker. He is taking Baclofen 1-2/week.  Sleep has improved. He is taking Elavil 25.  He states he believe he lost weight, but not exercising. Denies falls. Lightheadedness has improved.    He does not take any other medications for his pain and does not desire to. He requires a cane for ambulation and has a handicap placard.   Blood pressure is better controlled today at 120/74  He has been to multiple speciialists and was told he will not be able to see from this eye after her had a fall on this eye Has not been using SPRINT PNS but it did help when he used it.  Has been having  headaches.  -he is ready to try Qutenza today -he has numbness and tingling from his shins down to his feet and it is constant and feels uncomfortable -his back pain has also been severe -wife accompanies him today -he has decreased sensation in both feet   Pain Inventory Average Pain 4 Pain Right Now 0 My pain is intermittent, sharp, and tingling  In the last 24 hours, has pain interfered with the following? General activity 3 Relation with others 0 Enjoyment of life 3 What TIME of day is your pain at its worst? daytime Sleep (in general) Fair  Pain is worse with: walking and standing Pain improves with: heat/ice Relief from Meds: 0     Family History  Problem Relation Age of Onset   Colon cancer Brother 44   Colon polyps Brother    Lung cancer Brother    Other Mother        MVA, deceased 52s   Healthy Daughter    Healthy Son    Rectal cancer Neg Hx    Stomach cancer Neg Hx    Social History   Socioeconomic History   Marital status: Married    Spouse name: Not on file   Number of children: Not on file   Years of education: Not on file  Highest education level: Not on file  Occupational History    Employer: [employer]  Tobacco Use   Smoking status: Former    Packs/day: 2.00    Years: 31.00    Total pack years: 62.00    Types: Cigarettes    Start date: 79    Quit date: 10/23/1982    Years since quitting: 39.9   Smokeless tobacco: Never  Vaping Use   Vaping Use: Never used  Substance and Sexual Activity   Alcohol use: No    Alcohol/week: 0.0 standard drinks of alcohol   Drug use: No   Sexual activity: Not on file  Other Topics Concern   Not on file  Social History Narrative   Lives with wife in a one story home.  Has a son and a daughter.     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     Education: 2 years of college.   Social Determinants of Health   Financial Resource Strain: Low Risk  (01/10/2022)   Overall Financial  Resource Strain (CARDIA)    Difficulty of Paying Living Expenses: Not hard at all  Food Insecurity: No Food Insecurity (01/10/2022)   Hunger Vital Sign    Worried About Running Out of Food in the Last Year: Never true    Ran Out of Food in the Last Year: Never true  Transportation Needs: No Transportation Needs (01/10/2022)   PRAPARE - Hydrologist (Medical): No    Lack of Transportation (Non-Medical): No  Physical Activity: Inactive (01/10/2022)   Exercise Vital Sign    Days of Exercise per Week: 0 days    Minutes of Exercise per Session: 0 min  Stress: No Stress Concern Present (01/10/2022)   [place]    Feeling of Stress : Not at all  Social Connections: [place] (01/10/2022)   Social Connection and Isolation Panel [NHANES]    Frequency of Communication with Friends and Family: Twice a week    Frequency of Social Gatherings with Friends and Family: Twice a week    Attends Religious Services: 1 to 4 times per year    Active Member of [employer] or Organizations: Yes    Attends Archivist Meetings: 1 to 4 times per year    Marital Status: Married   Past Surgical History:  Procedure Laterality Date   Abdominal US  09/1997   arm fracture Left 1958   with hardware   Colon cancer screening     COLONOSCOPY  08/18/2004   diverticulitis   COLONOSCOPY  09/24/2009   ELECTROCARDIOGRAM  02/2006   FLEXIBLE SIGMOIDOSCOPY  03/04/2001   polyps, anal fissure   GANGLION CYST EXCISION Left 1980   HIP PINNING,CANNULATED Right 04/25/2021   Procedure: CANNULATED HIP PINNING;  Surgeon: Rod Can, MD;  Location: [facility];  Service: Orthopedics;  Laterality: Right;   Lower Arterial  04/13/2004   POLYPECTOMY     Rest Cardiolite  03/19/2003   Past Medical History:  Diagnosis Date   Allergy    Anal fissure    Anemia, unspecified    Arthritis    Spinal OA   BACK PAIN, LUMBAR 10/23/2007    CARDIAC MURMUR, SYSTOLIC 0/05/6577   DIABETES MELLITUS, TYPE I 4/69/6295   DIASTOLIC DYSFUNCTION 2/84/1324   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[place])    Duodenitis    Edema 05/12/2008   Esophageal stricture    GERD (gastroesophageal  reflux disease)    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   Gynecomastia    Hepatic steatosis    HYPERLIPIDEMIA 05/20/2007   Hypertension    Hypogonadism male    Morbid obesity ([place]) 09/10/2009   OBSTRUCTIVE SLEEP APNEA 11/04/2007   Personal history of colonic polyps    RENAL INSUFFICIENCY 05/12/2008   Sleep apnea    uses cpap   Squamous cell carcinoma of skin 02/16/2020   left lower leg, anterior-cx3,19f   Urolithiasis    Ht '5\' 11"'$  (1.803 m)   BMI 38.19 kg/m   Opioid Risk Score:   Fall Risk Score:  `1  Depression screen PHQ 2/9     05/24/2022   10:30 AM 02/16/2022    9:48 AM 01/10/2022    1:43 PM 01/10/2022    1:42 PM 01/10/2022    1:40 PM 12/27/2021    3:09 PM 11/15/2021    4:04 PM  Depression screen PHQ 2/9  Decreased Interest 0 0 0 0 0 0 0  Down, Depressed, Hopeless 0 0 0 0 0 0 0  PHQ - 2 Score 0 0 0 0 0 0 0    Review of Systems  HENT: Negative.    Eyes: Negative.   Respiratory:  Positive for apnea and shortness of breath.   Cardiovascular: Negative.   Gastrointestinal: Negative.   Endocrine:       Diabetic High blood sugar  Genitourinary: Negative.           Musculoskeletal:  Positive for arthralgias, back pain, gait problem, myalgias and neck pain.  Skin: Negative.   Allergic/Immunologic: Negative.   Neurological:  Positive for weakness and numbness.       Tingling  Hematological: Negative.   All other systems reviewed and are negative.     Objective:   Physical Exam  Constitutional: No distress . Vital signs reviewed. BMI 38.05, weight 272 lbs Gen: no distress, normal appearing HEENT: oral mucosa pink and moist, NCAT Cardio: Reg rate Chest: normal effort, normal rate of breathing Abd: soft, non-distended Ext: no edema Psych:  pleasant, normal affect Skin: intact Musc: TTP lumbar spinous process Musc: Gait: Mildly antalgic. Ambulating with cane at all times.  Neuro: Alert  HOH             Strength          5/5 in all LE myotomes Decreased sensation in bilateral feet    Assessment & Plan:  Male with pmh of OSA, CKD, HTN, DM type 1 with peripheral neuropathy presents for follow up of low back pain > neck pain.     1. Chronic mechanical low back pain             MRIs C,L-spine early 2016 suggesting multilevel spondylosis with mild b/l multilevel foraminal narrowing C4-C7 and shallow disc bulge at L5-S1 without central canal or foraminal stenosis.              Avoid NSAIDs due to CKD, discussed with patient since he takes Advil  Continue voltaren gel, advised that he could use up to 4 times per day.              Unable to tolerate Gabapentin, Lyrica             Cont HEP  Continue TENS             Aquatic therapy, not going anymore, states ineffective             Cont tylenol  Robaxin 500 TID  PRN, changed to Baclofen 10 TID PRN due to change insurance, continue   Continue Cymbalta '60mg'$              Cont back brace during periods of excessive activity, reminded to avoid prolonged use  Continue Lidoderm OTC, reminded              Encouraged rest breaks             Acknowledges he can do more if he make a conscious effort             PT completed    Pain over spinous process where clothing is resulting increased pressure.  Encourage change in clothing.   Sprint PNS removed today.   -Provided with a pain relief journal and discussed that it contains foods and lifestyle tips to naturally help to improve pain. Discussed that these lifestyle strategies are also very good for health unlike some medications which can have negative side effects. Discussed that the act of keeping a journal can be therapeutic and helpful to realize patterns what helps to trigger and alleviate pain.  -Discussed Qutenza as an option for  neuropathic pain control. Discussed that this is a capsaicin patch, stronger than capsaicin cream. Discussed that it is currently approved for diabetic peripheral neuropathy and post-herpetic neuralgia, but that it has also shown benefit in treating other forms of neuropathy. Provided patient with link to site to learn more about the patch: [employer]. Discussed that the patch would be placed in office and benefits usually last 3 months. Discussed that unintended exposure to capsaicin can cause severe irritation of eyes, mucous membranes, respiratory tract, and skin, but that Qutenza is a local treatment and does not have the systemic side effects of other nerve medications. Discussed that there may be pain, itching, erythema, and decreased sensory function associated with the application of Qutenza. Side effects usually subside within 1 week. A cold pack of analgesic medications can help with these side effects. Blood pressure can also be increased due to pain associated with administration of the patch.     2. Sacroiliitis             See #1             Continue brace             Not main pain generator at present, does not wish to proceed with injection at this time, particularly due to DM   3. Sleep disturbance             Recently new mattress             Continue CPAP             Improved  Insomnia: -Try to go outside near sunrise -Get exercise during the day.  -Turn off all devices an hour before bedtime.  -Teas that can benefit: chamomile, valerian root, Brahmi (Bacopa) -Can consider over the counter melatonin, magnesium, and/or L-theanine. Melatonin is an anti-oxidant with multiple health benefits. Magnesium is involved in greater than 300 enzymatic reactions in the body and most of [place] are deficient as our soil is often depleted. There are 7 different types of magnesium- Bioptemizer's is a supplement with all 7 types, and each has unique benefits. Magnesium can also help with  constipation and anxiety.  -Pistachios naturally increase the production of melatonin -[employer] bamboo bed sheets are free from toxic chemicals.  -Tart cherry juice or a tart cherry supplement can improve sleep  and soreness post-workout     4. Myalgia  Will schedule for trigger point injections   5. Diabetic neuropathy             See #1, #3             Side effects with elavil to '50mg'$  qhs, continue '25mg'$  educated on signs/symptoms of seratonin syndrome previously  Provided dietary education  Refilled Cymbalta.   Recommended no snacking after dinner -Discussed current symptoms of pain and history of pain.  -Discussed benefits of exercise in reducing pain. -Discussed following foods that may reduce pain: 1) Ginger (especially studied for arthritis)- reduce leukotriene production to decrease inflammation 2) Blueberries- high in phytonutrients that decrease inflammation 3) Salmon- marine omega-3s reduce joint swelling and pain 4) Pumpkin seeds- reduce inflammation 5) dark chocolate- reduces inflammation 6) turmeric- reduces inflammation 7) tart cherries - reduce pain and stiffness 8) extra virgin olive oil - its compound olecanthal helps to block prostaglandins  9) chili peppers- can be eaten or applied topically via capsaicin 10) mint- helpful for headache, muscle aches, joint pain, and itching 11) garlic- reduces inflammation  Link to further information on diet for chronic pain: http://www.randall.com/   -Discussed Qutenza as an option for neuropathic pain control. Discussed that this is a capsaicin patch, stronger than capsaicin cream. Discussed that it is currently approved for diabetic peripheral neuropathy and post-herpetic neuralgia, but that it has also shown benefit in treating other forms of neuropathy. Provided patient with link to site to learn more about the patch: [employer]fr. Discussed that  the patch would be placed in office and benefits usually last 3 months. Discussed that unintended exposure to capsaicin can cause severe irritation of eyes, mucous membranes, respiratory tract, and skin, but that Qutenza is a local treatment and does not have the systemic side effects of other nerve medications. Discussed that there may be pain, itching, erythema, and decreased sensory function associated with the application of Qutenza. Side effects usually subside within 1 week. A cold pack of analgesic medications can help with these side effects. Blood pressure can also be increased due to pain associated with administration of the patch.    6. Morbid obesity             Not interested in seeing dietitian at this time  Encouraged activity   No attempts at weight loss  Discussed Topamax.    7. Gait abnormality             Completed therapies, cont HEP  Continue cane for safety   8. Nonhealing ulcer             Left lateral foot, improving  9. Lightheadedness  Encouraged BP monitoring at home

## 2022-09-19 NOTE — Patient Instructions (Signed)
Hot water with ginger, honey, and lime

## 2022-09-27 ENCOUNTER — Ambulatory Visit: Payer: Medicare Other | Attending: Family Medicine | Admitting: Physical Therapy

## 2022-09-27 ENCOUNTER — Encounter: Payer: Self-pay | Admitting: Physical Therapy

## 2022-09-27 DIAGNOSIS — M25562 Pain in left knee: Secondary | ICD-10-CM | POA: Diagnosis present

## 2022-09-27 DIAGNOSIS — R293 Abnormal posture: Secondary | ICD-10-CM | POA: Diagnosis present

## 2022-09-27 DIAGNOSIS — M6281 Muscle weakness (generalized): Secondary | ICD-10-CM | POA: Diagnosis present

## 2022-09-27 DIAGNOSIS — R278 Other lack of coordination: Secondary | ICD-10-CM | POA: Insufficient documentation

## 2022-09-27 DIAGNOSIS — R2681 Unsteadiness on feet: Secondary | ICD-10-CM | POA: Insufficient documentation

## 2022-09-27 DIAGNOSIS — G4733 Obstructive sleep apnea (adult) (pediatric): Secondary | ICD-10-CM | POA: Diagnosis not present

## 2022-09-27 DIAGNOSIS — R262 Difficulty in walking, not elsewhere classified: Secondary | ICD-10-CM | POA: Insufficient documentation
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
## 2022-10-05 ENCOUNTER — Ambulatory Visit: Payer: Medicare Other | Admitting: Physical Therapy

## 2022-10-12 ENCOUNTER — Ambulatory Visit: Payer: Medicare Other | Admitting: Physical Therapy

## 2022-10-12 ENCOUNTER — Encounter: Payer: Self-pay | Admitting: Physical Therapy

## 2022-10-12 DIAGNOSIS — R293 Abnormal posture: Secondary | ICD-10-CM | POA: Diagnosis not present

## 2022-10-12 DIAGNOSIS — M25562 Pain in left knee: Secondary | ICD-10-CM

## 2022-10-12 DIAGNOSIS — R262 Difficulty in walking, not elsewhere classified: Secondary | ICD-10-CM

## 2022-10-12 DIAGNOSIS — R278 Other lack of coordination: Secondary | ICD-10-CM

## 2022-10-12 DIAGNOSIS — R2681 Unsteadiness on feet: Secondary | ICD-10-CM

## 2022-10-12 DIAGNOSIS — M6281 Muscle weakness (generalized): Secondary | ICD-10-CM
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
## 2022-10-16 NOTE — Progress Notes (Signed)
Subjective:    Patient ID: [name], male    DOB: 1941-10-11, 81 y.o.   MRN: [id number]  HPI   male former smoker followed for OSA, complicated by obesity, HBP, DM 2, diastolic dysfunction, GERD NPSG 2004 AHI 9/hr  --------------------------------------------------------------------------------------------    06/15/22-  81 year old male former smoker followed for OSA, Lung Nodules,  complicated by obesity, HBP, DM 2, diastolic dysfunction, GERD, HLD, CKD,  CPAP auto 5-15/ [facility]       AirSense 11 AutoSet Download-compliance  33%, AHI 8.2/ hr Body weight today- 269 lbs Covid vax- Download reviewed.  He claims he feels sleeping well without CPAP.  Baseline NPSG 2004 was only AHI 9.  He agrees to update sleep study. CXR last year suggested?  Nodule was only rib shadow.  We will recheck. CXR 06/14/21-  IMPRESSION: Negative for acute cardiopulmonary disease.  No nodule identified on the current plain film, which is favored previously to have been related to the rib  10/17/22-  81 year old male former smoker followed for OSA, Lung Nodules,  complicated by obesity, HBP, DM 2, diastolic dysfunction, GERD, HLD, CKD,  CPAP auto 5-15/ [facility]       AirSense 11 AutoSet Download-compliance   Body weight today- 268 lbs Covid vax-3 Phizer Flu vax-had HST 09/14/22- AHI 12.3/ hr, desaturation to 83%, body weight 273 lbs Updated sleep study- treatment decision. He felt he was sleeping well without CPAP. He has a good machine, so I suggested he resume using it. Will need to change from [facility] as they are stopping CPAP work. He is aware of occasional rapid heartbeat without dizziness or chest pain.He is to discuss with his cardiologist.   ROS-see HPI   + = positive Constitutional:    weight loss, night sweats, fevers, chills, fatigue, lassitude. HEENT:    headaches, difficulty swallowing, tooth/dental problems, sore throat,       sneezing, itching, ear ache, nasal  congestion, post nasal drip, snoring CV:    chest pain, orthopnea, PND, swelling in lower extremities, anasarca,                                   dizziness, +palpitations Resp:   shortness of breath with exertion or at rest.                productive cough,   non-productive cough, coughing up of blood.              change in color of mucus.  wheezing.   Skin:    rash or lesions. GI:  No-   heartburn, indigestion, abdominal pain, nausea, vomiting, diarrhea,                 change in bowel habits, loss of appetite GU: dysuria, change in color of urine, no urgency or frequency.   flank pain. MS:   joint pain, stiffness, decreased range of motion, back pain. Neuro-     nothing unusual Psych:  change in mood or affect.  depression or anxiety.   memory loss. .    Objective:  OBJ- Physical Exam General- Alert, Oriented, Affect-appropriate/ pleasant , Distress- none acute, + obese Skin- rash-none, lesions- none, excoriation- none Lymphadenopathy- none Head- atraumatic            Eyes- +squint R eye closed            Ears- Hearing, canals-normal  Nose- Clear, no-Septal dev, mucus, polyps, erosion, perforation             Throat- Mallampati III , mucosa clear , drainage- none, tonsils- atrophic Neck- flexible , trachea midline, no stridor , thyroid nl, carotid no bruit Chest - symmetrical excursion , unlabored           Heart/CV- RRR , no murmur , no gallop  , no rub, nl s1 s2                           - JVD- none , edema- none, stasis changes- none, varices- none           Lung- clear to P&A, wheeze- none, cough- none , dullness-none, rub- none           Chest wall-  Abd-  Br/ Gen/ Rectal- Not done, not indicated Extrem- cyanosis- none, clubbing, none, atrophy- none, strength- nl, +cane Neuro- grossly intact to observation    Assessment & Plan:

## 2022-10-17 ENCOUNTER — Ambulatory Visit (INDEPENDENT_AMBULATORY_CARE_PROVIDER_SITE_OTHER): Payer: Medicare Other | Admitting: Internal Medicine

## 2022-10-17 ENCOUNTER — Encounter: Payer: Self-pay | Admitting: Internal Medicine

## 2022-10-17 VITALS — BP 128/70 | HR 80 | Ht 71.0 in | Wt 268.0 lb

## 2022-10-17 DIAGNOSIS — G4733 Obstructive sleep apnea (adult) (pediatric): Secondary | ICD-10-CM | POA: Diagnosis not present

## 2022-10-17 NOTE — Patient Instructions (Signed)
Order- [facility]- please change from [facility] to [place]   continue CPAP auto 5-15, mask of choice, humidifier, supplies, AirView/ card  As discussed- I do think you will be best off if you continue CPAP.  Please call if we can help

## 2022-10-19 ENCOUNTER — Ambulatory Visit: Payer: Medicare Other | Admitting: Physical Therapy

## 2022-10-24 ENCOUNTER — Ambulatory Visit: Payer: Medicare Other | Admitting: Family Medicine

## 2022-10-24 ENCOUNTER — Encounter: Payer: Self-pay | Admitting: Family Medicine

## 2022-10-24 VITALS — BP 128/78 | HR 79 | Temp 97.9°F | Ht 71.0 in | Wt 267.2 lb

## 2022-10-24 DIAGNOSIS — Z8739 Personal history of other diseases of the musculoskeletal system and connective tissue: Secondary | ICD-10-CM | POA: Diagnosis not present

## 2022-10-24 DIAGNOSIS — E538 Deficiency of other specified B group vitamins: Secondary | ICD-10-CM

## 2022-10-24 DIAGNOSIS — R58 Hemorrhage, not elsewhere classified: Secondary | ICD-10-CM

## 2022-10-24 DIAGNOSIS — N1831 Chronic kidney disease, stage 3a: Secondary | ICD-10-CM | POA: Diagnosis not present

## 2022-10-24 LAB — CBC WITH DIFFERENTIAL/PLATELET
Basophils Absolute: 0 10*3/uL (ref 0.0–0.1)
Basophils Relative: 0.3 % (ref 0.0–3.0)
Eosinophils Absolute: 0.1 10*3/uL (ref 0.0–0.7)
Eosinophils Relative: 1.3 % (ref 0.0–5.0)
HCT: 45.2 % (ref 39.0–52.0)
Hemoglobin: 15.4 g/dL (ref 13.0–17.0)
Lymphocytes Relative: 35.8 % (ref 12.0–46.0)
Lymphs Abs: 1.7 10*3/uL (ref 0.7–4.0)
MCHC: 34 g/dL (ref 30.0–36.0)
MCV: 93 fl (ref 78.0–100.0)
Monocytes Absolute: 0.3 10*3/uL (ref 0.1–1.0)
Monocytes Relative: 6 % (ref 3.0–12.0)
Neutro Abs: 2.7 10*3/uL (ref 1.4–7.7)
Neutrophils Relative %: 56.6 % (ref 43.0–77.0)
Platelets: 178 10*3/uL (ref 150.0–400.0)
RBC: 4.87 Mil/uL (ref 4.22–5.81)
RDW: 14.5 % (ref 11.5–15.5)
WBC: 4.8 10*3/uL (ref 4.0–10.5)

## 2022-10-24 LAB — BASIC METABOLIC PANEL
BUN: 18 mg/dL (ref 6–23)
CO2: 30 mEq/L (ref 19–32)
Calcium: 9.7 mg/dL (ref 8.4–10.5)
Chloride: 102 mEq/L (ref 96–112)
Creatinine, Ser: 1.43 mg/dL (ref 0.40–1.50)
GFR: 46.11 mL/min — ABNORMAL LOW (ref >60.00)
Glucose, Bld: 110 mg/dL — ABNORMAL HIGH (ref 70–99)
Potassium: 4.1 mEq/L (ref 3.5–5.1)
Sodium: 142 mEq/L (ref 135–145)

## 2022-10-24 LAB — VITAMIN B12: Vitamin B-12: 359 pg/mL (ref 211–911)

## 2022-10-24 LAB — URIC ACID: Uric Acid, Serum: 5.7 mg/dL (ref 4.0–7.8)

## 2022-10-24 NOTE — Progress Notes (Signed)
Established Patient Office Visit   Subjective:  Patient ID: [name], male    DOB: Sep 20, 1941  Age: 82 y.o. MRN: [id number]  Chief Complaint  Patient presents with  . Acute Visit     Bruise on right side x 2 weeks     HPI Encounter Diagnoses  Name Primary?  . Stage 3a chronic kidney disease ([place]) Yes  . B12 deficiency   . History of gout   . Ecchymosis on examination    Follow-up of CKD, B12 deficiency, history of gout.  Had walked into a doorknob a few weeks ago and developed a bruise on his right abdomen.  It has been painful for several days but is now improving.  Continues  Uroxatrol and of 1000 mcg of cyanocobalamin mean for history of gout and B12 deficiency.  Feels well.   Review of Systems  Constitutional: Negative.   HENT: Negative.    Eyes:  Negative for blurred vision, discharge and redness.  Respiratory: Negative.    Cardiovascular: Negative.   Gastrointestinal:  Negative for abdominal pain.  Genitourinary: Negative.   Musculoskeletal: Negative.  Negative for myalgias.  Skin:  Negative for rash.  Neurological:  Negative for tingling, loss of consciousness and weakness.  Endo/Heme/Allergies:  Negative for polydipsia.     Current Outpatient Medications:  .  Accu-Chek FastClix Lancets MISC, USE TO CHECK BLOOD SUGAR UP TO 6 TIMES DAILY, Disp: 612 each, Rfl: 3 .  alfuzosin (UROXATRAL) 10 MG 24 hr tablet, TAKE 1 TABLET BY MOUTH  DAILY WITH BREAKFAST, Disp: 90 tablet, Rfl: 3 .  amitriptyline (ELAVIL) 50 MG tablet, TAKE ONE-HALF TABLET BY  MOUTH AT BEDTIME, Disp: 45 tablet, Rfl: 3 .  Blood Glucose Monitoring Suppl (ACCU-CHEK AVIVA PLUS) w/Device KIT, Use as directed, Disp: 1 kit, Rfl: 0 .  cetirizine (ZYRTEC) 10 MG tablet, Take 10 mg by mouth daily as needed for allergies., Disp: , Rfl:  .  Continuous Blood Gluc Sensor (DEXCOM G6 SENSOR) MISC, Inject 1 Device into the skin as directed. Apply to skin SQ every 10 days, Disp: 9 each, Rfl: 3 .  diclofenac Sodium  (VOLTAREN) 1 % GEL, Apply 2 g topically 4 (four) times daily., Disp: 150 g, Rfl: 3 .  DULoxetine (CYMBALTA) 60 MG capsule, Take 1 capsule (60 mg total) by mouth daily., Disp: 90 capsule, Rfl: 3 .  fluticasone (FLONASE) 50 MCG/ACT nasal spray, Place 1 spray into the nose daily as needed for allergies. , Disp: , Rfl:  .  folic acid (FOLVITE) 1 MG tablet, Take 1 mg by mouth daily., Disp: , Rfl:  .  furosemide (LASIX) 20 MG tablet, TAKE 1 TABLET BY MOUTH  DAILY, Disp: 90 tablet, Rfl: 3 .  glucose blood (ACCU-CHEK AVIVA PLUS) test strip, USE TO TEST BLOOD SUGAR 6  TIMES PER DAY; E11.9, Disp: 600 each, Rfl: 11 .  Insulin Infusion Pump Supplies (AUTOSOFT XC INFUSION SET) MISC, Inject 1 Act into the skin every other day. Use with 4m cannula and 23 inch tubing. *5 boxes (90 day supply), Disp: 5 each, Rfl: 3 .  Insulin Infusion Pump Supplies (T:SLIM INSULIN CARTRIDGE 3ML) MISC, Use with insulin pump, fill once every 2 days., Disp: 5 each, Rfl: 3 .  insulin lispro (HUMALOG) 100 UNIT/ML injection, INJECT SUBCUTANEOUSLY VIA  INSULIN PUMP TOTAL OF 120  UNITS PER DAY, Disp: 110 mL, Rfl: 3 .  losartan (COZAAR) 25 MG tablet, Take 12.5 mg by mouth daily., Disp: , Rfl:  .  Multiple Vitamins-Minerals (CENTRUM  SILVER PO), Take 1 tablet by mouth daily., Disp: , Rfl:  .  Omega-3 Fatty Acids (FISH OIL) 1200 MG CAPS, Take 1,200 mg by mouth daily., Disp: , Rfl:  .  omeprazole (PRILOSEC) 20 MG capsule, Take 1 capsule (20 mg total) by mouth daily as needed., Disp: 90 capsule, Rfl: 2 .  predniSONE (STERAPRED UNI-PAK 21 TAB) 5 MG (21) TBPK tablet, Take as directed, Disp: 21 tablet, Rfl: 0 .  rosuvastatin (CRESTOR) 40 MG tablet, TAKE 1 TABLET BY MOUTH AT  BEDTIME, Disp: 90 tablet, Rfl: 3 .  tiZANidine (ZANAFLEX) 4 MG tablet, Take 1 tablet (4 mg total) by mouth 2 (two) times daily as needed for muscle spasms., Disp: 30 tablet, Rfl: 0 .  topiramate (TOPAMAX) 25 MG tablet, Take 1 tablet (25 mg total) by mouth at bedtime., Disp: 90  tablet, Rfl: 3 .  vitamin B-12 (CYANOCOBALAMIN) 1000 MCG tablet, Take 1,000 mcg by mouth daily., Disp: , Rfl:  .  Semaglutide,0.25 or 0.5MG/DOS, 2 MG/3ML SOPN, Inject 0.50m once a week. (Patient not taking: Reported on 10/24/2022), Disp: 3 mL, Rfl: 1   Objective:     BP 128/78 (BP Location: Right Arm, Patient Position: Sitting, Cuff Size: Normal)   Pulse 79   Temp 97.9 F (36.6 C) (Temporal)   Ht _0  (1.803 m)   Wt 267 lb 3.2 oz (121.2 kg)   SpO2 94%   BMI 37.27 kg/m    Physical Exam Constitutional:      General: He is not in acute distress.    Appearance: Normal appearance. He is not ill-appearing, toxic-appearing or diaphoretic.  HENT:     Head: Normocephalic and atraumatic.     Right Ear: External ear normal.     Left Ear: External ear normal.  Eyes:     General: No scleral icterus.       Right eye: No discharge.        Left eye: No discharge.  Pulmonary:     Effort: Pulmonary effort is normal. No respiratory distress.  Skin:    General: Skin is warm and dry.       Neurological:     Mental Status: He is alert and oriented to person, place, and time.  Psychiatric:        Mood and Affect: Mood normal.        Behavior: Behavior normal.     No results found for any visits on 10/24/22.    The ASCVD Risk score (Arnett DK, et al., 2019) failed to calculate for the following reasons:   The 2019 ASCVD risk score is only valid for ages 463to 748   Assessment & Plan:   Stage 3a chronic kidney disease ([place] -     Basic metabolic panel  BY24deficiency -     Vitamin B12  History of gout -     Uric acid  Ecchymosis on examination -     CBC with Differential/Platelet    Return in about 6 months (around 04/24/2023).  As above.  Follow-up sooner if ecchymosis does not continue to resolve. WLibby Maw MD

## 2022-10-31 ENCOUNTER — Ambulatory Visit: Payer: Medicare Other | Admitting: Family Medicine

## 2022-11-08 NOTE — Assessment & Plan Note (Signed)
Weight management remains a priority

## 2022-11-08 NOTE — Assessment & Plan Note (Signed)
Benefits from CPAP. Plan- continue auto 5-15. Needs to change DME from [facility]

## 2022-11-09 ENCOUNTER — Other Ambulatory Visit: Payer: Self-pay | Admitting: Family Medicine

## 2022-11-21 ENCOUNTER — Ambulatory Visit: Payer: Medicare Other | Admitting: Orthopaedic Surgery

## 2022-11-21 ENCOUNTER — Encounter
Payer: Medicare Other | Attending: Physical Medicine and Rehabilitation | Admitting: Physical Medicine and Rehabilitation

## 2022-11-21 ENCOUNTER — Encounter: Payer: Self-pay | Admitting: Orthopaedic Surgery

## 2022-11-21 ENCOUNTER — Encounter: Payer: Self-pay | Admitting: Physical Medicine and Rehabilitation

## 2022-11-21 VITALS — BP 146/81 | HR 83 | Temp 97.5°F | Ht 71.0 in | Wt 270.0 lb

## 2022-11-21 VITALS — Ht 71.0 in | Wt 268.0 lb

## 2022-11-21 DIAGNOSIS — R269 Unspecified abnormalities of gait and mobility: Secondary | ICD-10-CM | POA: Diagnosis present

## 2022-11-21 DIAGNOSIS — M25562 Pain in left knee: Secondary | ICD-10-CM

## 2022-11-21 DIAGNOSIS — M545 Low back pain, unspecified: Secondary | ICD-10-CM | POA: Insufficient documentation

## 2022-11-21 DIAGNOSIS — G8929 Other chronic pain: Secondary | ICD-10-CM | POA: Diagnosis present

## 2022-11-21 MED ORDER — LIDOCAINE HCL 1 % IJ SOLN
2.0000 mL | INTRAMUSCULAR | Status: AC | PRN
  Administered 2022-11-21: 2 mL

## 2022-11-21 MED ORDER — BUPIVACAINE HCL 0.5 % IJ SOLN
2.0000 mL | INTRAMUSCULAR | Status: AC | PRN
  Administered 2022-11-21: 2 mL via INTRA_ARTICULAR

## 2022-11-21 MED ORDER — METHYLPREDNISOLONE ACETATE 40 MG/ML IJ SUSP
40.0000 mg | INTRAMUSCULAR | Status: AC | PRN
  Administered 2022-11-21: 40 mg via INTRA_ARTICULAR

## 2022-11-21 NOTE — Progress Notes (Signed)
Subjective:    Patient ID: [name], male    DOB: 1941/01/11, 82 y.o.   MRN: [id number]   HPI  Male with pmh of OSA, CKD, HTN, DM type 1 who presents for f/u of diabetic peripheral neuropathy and low back pain > neck pain.   On first visit, pt complained of b/l L>R back pain.  Started ~>20 years ago.  No inciting event.  Sitting/laying improve the pain.  Standing exacerbates the pain.  Sharp, burning pain.  Radiates to left posterior thigh.  Intermittent.  He has associated numbness/tingling.  He only tried ASA, which helps some.  Pain limits from doing ADLs and fishing.  He denies falls. Back pain has been ok especially if sitting down Can't stand more than 5 or 10 minutes Tried PT and cannot feel difference -pain is worst on left.  Last Qutenza patch helped a lot for his back but he was charged $700 Qutenza patches helped on feet but not much [place] since he was ere last, landed on his knees He is doing well decreasing snacking after dinner 169 CBG right now.  -he usually snacks at night on potato chips after dinner -does not eat much more baked potatoes Voltaren cream helps the back pain but does not stop it When he fishes he feels pain from the jolting- fishing made it worse -He takes advil and this helps a little.  Sometimes it moves into his legs.   Last clinic visit on 10/21/2020.  He had trigger point injections at that time.  Since that time, patient states he had great benefit for about a week.  He uses a TENS with benefit. Patient states he feels he is getting weaker. He is taking Baclofen 1-2/week.  Sleep has improved. He is taking Elavil 25.  He states he believe he lost weight, but not exercising. Denies falls. Lightheadedness has improved.    He does not take any other medications for his pain and does not desire to. He requires a cane for ambulation and has a handicap placard.   Blood pressure is better controlled today at 120/74  He has been to multiple speciialists  and was told he will not be able to see from this eye after her had a fall on this eye Has not been using SPRINT PNS but it did help when he used it.  Has been having headaches.  -he is ready to try Qutenza today -he has numbness and tingling from his shins down to his feet and it is constant and feels uncomfortable -his back pain has also been severe -wife accompanies him today -he has decreased sensation in both feet   Pain Inventory Average Pain 4 Pain Right Now 0 My pain is intermittent, sharp, and tingling  In the last 24 hours, has pain interfered with the following? General activity 3 Relation with others 0 Enjoyment of life 3 What TIME of day is your pain at its worst? daytime Sleep (in general) Fair  Pain is worse with: walking and standing Pain improves with: heat/ice Relief from Meds: 0     Family History  Problem Relation Age of Onset   Colon cancer Brother 67   Colon polyps Brother    Lung cancer Brother    Other Mother        MVA, deceased 7s   Healthy Daughter    Healthy Son    Rectal cancer Neg Hx    Stomach cancer Neg Hx    Social History  Socioeconomic History   Marital status: Married    Spouse name: Not on file   Number of children: Not on file   Years of education: Not on file   Highest education level: Not on file  Occupational History    Employer: [employer]  Tobacco Use   Smoking status: Former    Packs/day: 2.00    Years: 31.00    Total pack years: 62.00    Types: Cigarettes    Start date: 33    Quit date: 10/23/1982    Years since quitting: 40.1   Smokeless tobacco: Never  Vaping Use   Vaping Use: Never used  Substance and Sexual Activity   Alcohol use: No    Alcohol/week: 0.0 standard drinks of alcohol   Drug use: No   Sexual activity: Not on file  Other Topics Concern   Not on file  Social History Narrative   Lives with wife in a one story home.  Has a son and a daughter.     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     Education: 2 years of college.   Social Determinants of Health   Financial Resource Strain: Low Risk  (01/10/2022)   Overall Financial Resource Strain (CARDIA)    Difficulty of Paying Living Expenses: Not hard at all  Food Insecurity: No Food Insecurity (01/10/2022)   Hunger Vital Sign    Worried About Running Out of Food in the Last Year: Never true    Ran Out of Food in the Last Year: Never true  Transportation Needs: No Transportation Needs (01/10/2022)   PRAPARE - Hydrologist (Medical): No    Lack of Transportation (Non-Medical): No  Physical Activity: Inactive (01/10/2022)   Exercise Vital Sign    Days of Exercise per Week: 0 days    Minutes of Exercise per Session: 0 min  Stress: No Stress Concern Present (01/10/2022)   [place]    Feeling of Stress : Not at all  Social Connections: [place] (01/10/2022)   Social Connection and Isolation Panel [NHANES]    Frequency of Communication with Friends and Family: Twice a week    Frequency of Social Gatherings with Friends and Family: Twice a week    Attends Religious Services: 1 to 4 times per year    Active Member of [employer] or Organizations: Yes    Attends Archivist Meetings: 1 to 4 times per year    Marital Status: Married   Past Surgical History:  Procedure Laterality Date   Abdominal US  09/1997   arm fracture Left 1958   with hardware   Colon cancer screening     COLONOSCOPY  08/18/2004   diverticulitis   COLONOSCOPY  09/24/2009   ELECTROCARDIOGRAM  02/2006   FLEXIBLE SIGMOIDOSCOPY  03/04/2001   polyps, anal fissure   GANGLION CYST EXCISION Left 1980   HIP PINNING,CANNULATED Right 04/25/2021   Procedure: CANNULATED HIP PINNING;  Surgeon: Rod Can, MD;  Location: [facility];  Service: Orthopedics;  Laterality: Right;   Lower Arterial  04/13/2004   POLYPECTOMY     Rest Cardiolite   03/19/2003   Past Medical History:  Diagnosis Date   Allergy    Anal fissure    Anemia, unspecified    Arthritis    Spinal OA   BACK PAIN, LUMBAR 10/23/2007   CARDIAC MURMUR, SYSTOLIC 03/29/1244   DIABETES MELLITUS, TYPE I 05/20/2007  DIASTOLIC DYSFUNCTION 5/00/9381   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[place])    Duodenitis    Edema 05/12/2008   Esophageal stricture    GERD (gastroesophageal reflux disease)    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   Gynecomastia    Hepatic steatosis    HYPERLIPIDEMIA 05/20/2007   Hypertension    Hypogonadism male    Morbid obesity ([place]) 09/10/2009   OBSTRUCTIVE SLEEP APNEA 11/04/2007   Personal history of colonic polyps    RENAL INSUFFICIENCY 05/12/2008   Sleep apnea    uses cpap   Squamous cell carcinoma of skin 02/16/2020   left lower leg, anterior-cx3,32f   Urolithiasis    BP (!) 146/81   Pulse 83   Temp (!) 97.5 F (36.4 C)   Ht '5\' 11"'$  (1.803 m)   Wt 270 lb (122.5 kg)   SpO2 98%   BMI 37.66 kg/m   Opioid Risk Score:   Fall Risk Score:  `1  Dep[facility]     11/21/2022   10:42 AM 09/19/2022   11:45 AM 05/24/2022   10:30 AM 02/16/2022    9:48 AM 01/10/2022    1:43 PM 01/10/2022    1:42 PM 01/10/2022    1:40 PM  Depression screen PHQ 2/9  Decreased Interest 0 0 0 0 0 0 0  Down, Depressed, Hopeless 0 0 0 0 0 0 0  PHQ - 2 Score 0 0 0 0 0 0 0    Review of Systems  HENT: Negative.    Eyes: Negative.   Respiratory:  Positive for apnea and shortness of breath.   Cardiovascular: Negative.   Gastrointestinal: Negative.   Endocrine:       Diabetic High blood sugar  Genitourinary: Negative.           Musculoskeletal:  Positive for arthralgias, back pain, gait problem, myalgias and neck pain.  Skin: Negative.   Allergic/Immunologic: Negative.   Neurological:  Positive for weakness and numbness.       Tingling  Hematological: Negative.   All other systems reviewed and are negative.     Objective:   Physical Exam   Constitutional: No distress . Vital signs reviewed. BMI 37.66, weight 270 lbs, 146/81 Gen: no distress, normal appearing HEENT: oral mucosa pink and moist, NCAT Cardio: Reg rate Chest: normal effort, normal rate of breathing Abd: soft, non-distended Ext: no edema Psych: pleasant, normal affect Skin: intact, no open lesions Musc: TTP lumbar spinous process Musc: Gait: Mildly antalgic. Ambulating with cane at all times.  Neuro: Alert  HOH             Strength          5/5 in all LE myotomes Decreased sensation in bilateral feet    Assessment & Plan:  Male with pmh of OSA, CKD, HTN, DM type 1 with peripheral neuropathy presents for follow up of low back pain > neck pain.     1. Chronic mechanical low back pain             MRIs C,L-spine early 2016 suggesting multilevel spondylosis with mild b/l multilevel foraminal narrowing C4-C7 and shallow disc bulge at L5-S1 without central canal or foraminal stenosis.              Avoid NSAIDs due to CKD, discussed with patient since he takes Advil  Continue voltaren gel, advised that he could use up to 4 times per day.  Unable to tolerate Gabapentin, Lyrica             Cont HEP  Discussed repeat MIR of lumbar spine  Continue TENS             Aquatic therapy, not going anymore, states ineffective             Cont tylenol  Robaxin 500 TID PRN, changed to Baclofen 10 TID PRN due to change insurance, continue   Continue Cymbalta '60mg'$              Cont back brace during periods of excessive activity, reminded to avoid prolonged use  Continue Lidoderm OTC, reminded              Encouraged rest breaks             Acknowledges he can do more if he make a conscious effort             PT completed    Pain over spinous process where clothing is resulting increased pressure.  Encourage change in clothing.   Sprint PNS removed today.   -Provided with a pain relief journal and discussed that it contains foods and lifestyle tips to naturally  help to improve pain. Discussed that these lifestyle strategies are also very good for health unlike some medications which can have negative side effects. Discussed that the act of keeping a journal can be therapeutic and helpful to realize patterns what helps to trigger and alleviate pain.  -Discussed Qutenza as an option for neuropathic pain control. Discussed that this is a capsaicin patch, stronger than capsaicin cream. Discussed that it is currently approved for diabetic peripheral neuropathy and post-herpetic neuralgia, but that it has also shown benefit in treating other forms of neuropathy. Provided patient with link to site to learn more about the patch: CinemaBonus.fr. Discussed that the patch would be placed in office and benefits usually last 3 months. Discussed that unintended exposure to capsaicin can cause severe irritation of eyes, mucous membranes, respiratory tract, and skin, but that Qutenza is a local treatment and does not have the systemic side effects of other nerve medications. Discussed that there may be pain, itching, erythema, and decreased sensory function associated with the application of Qutenza. Side effects usually subside within 1 week. A cold pack of analgesic medications can help with these side effects. Blood pressure can also be increased due to pain associated with administration of the patch.   2. Sacroiliitis             See #1             Continue brace             Not main pain generator at present, does not wish to proceed with injection at this time, particularly due to DM   3. Sleep disturbance             Recently new mattress             Continue CPAP             Improved  Insomnia: -Try to go outside near sunrise -Get exercise during the day.  -Turn off all devices an hour before bedtime.  -Teas that can benefit: chamomile, valerian root, Brahmi (Bacopa) -Can consider over the counter melatonin, magnesium, and/or L-theanine. Melatonin is an  anti-oxidant with multiple health benefits. Magnesium is involved in greater than 300 enzymatic reactions in the body and most of [place] are deficient  as our soil is often depleted. There are 7 different types of magnesium- Bioptemizer's is a supplement with all 7 types, and each has unique benefits. Magnesium can also help with constipation and anxiety.  -Pistachios naturally increase the production of melatonin -Cozy Earth bamboo bed sheets are free from toxic chemicals.  -Tart cherry juice or a tart cherry supplement can improve sleep and soreness post-workout     4. Myalgia  Will schedule for trigger point injections   5. Diabetic neuropathy             See #1, #3             Side effects with elavil to '50mg'$  qhs, continue '25mg'$  educated on signs/symptoms of seratonin syndrome previously  Provided dietary education  Refilled Cymbalta.   Recommended no snacking after dinner -Discussed current symptoms of pain and history of pain.  -Discussed benefits of exercise in reducing pain. -Discussed following foods that may reduce pain: 1) Ginger (especially studied for arthritis)- reduce leukotriene production to decrease inflammation 2) Blueberries- high in phytonutrients that decrease inflammation 3) Salmon- marine omega-3s reduce joint swelling and pain 4) Pumpkin seeds- reduce inflammation 5) dark chocolate- reduces inflammation 6) turmeric- reduces inflammation 7) tart cherries - reduce pain and stiffness 8) extra virgin olive oil - its compound olecanthal helps to block prostaglandins  9) chili peppers- can be eaten or applied topically via capsaicin 10) mint- helpful for headache, muscle aches, joint pain, and itching 11) garlic- reduces inflammation  Link to further information on diet for chronic pain: http://www.randall.com/   -Discussed Qutenza as an option for neuropathic pain control. Discussed that this is a  capsaicin patch, stronger than capsaicin cream. Discussed that it is currently approved for diabetic peripheral neuropathy and post-herpetic neuralgia, but that it has also shown benefit in treating other forms of neuropathy. Provided patient with link to site to learn more about the patch: CinemaBonus.fr. Discussed that the patch would be placed in office and benefits usually last 3 months. Discussed that unintended exposure to capsaicin can cause severe irritation of eyes, mucous membranes, respiratory tract, and skin, but that Qutenza is a local treatment and does not have the systemic side effects of other nerve medications. Discussed that there may be pain, itching, erythema, and decreased sensory function associated with the application of Qutenza. Side effects usually subside within 1 week. A cold pack of analgesic medications can help with these side effects. Blood pressure can also be increased due to pain associated with administration of the patch.    6. Morbid obesity             Not interested in seeing dietitian at this time  Encouraged activity   No attempts at weight loss  Discussed Topamax.    7. Gait abnormality             Completed therapies, cont HEP  Continue cane for safety   8. Nonhealing ulcer             Left lateral foot, improving  9. Lightheadedness  Encouraged BP monitoring at home  40 minutes spent in discussion of risks and benefits of Qutenza, location of his pain, CBGs, weight loss, current diet.

## 2022-11-21 NOTE — Progress Notes (Signed)
Office Visit Note   Patient: [name]           Date of Birth: Nov 08, 1940           MRN: [id number] Visit Date: 11/21/2022              Requested by: Libby Maw, MD [address] [place],  [place] [address] PCP: Libby Maw, MD   Assessment & Plan: Visit Diagnoses:  1. Chronic pain of left knee     Plan: Impression is left knee pain.  It is still somewhat hard to distinguish whether his symptoms are coming from his knee or his back but we have agreed to proceed with diagnostic and hopefully therapeutic intra-articular cortisone injection of the left knee.  He will follow-up with [place] as needed.  Follow-Up Instructions: Return if symptoms worsen or fail to improve.   Orders:  No orders of the defined types were placed in this encounter.  No orders of the defined types were placed in this encounter.     Procedures: Large Joint Inj: L knee on 11/21/2022 7:49 PM Details: 22 G needle Medications: 2 mL bupivacaine 0.5 %; 2 mL lidocaine 1 %; 40 mg methylPREDNISolone acetate 40 MG/ML Outcome: tolerated well, no immediate complications Patient was prepped and draped in the usual sterile fashion.       Clinical Data: No additional findings.   Subjective: Chief Complaint  Patient presents with   Left Knee - Pain    HPI patient is a pleasant 82 year old gentleman who comes in today with left knee pain and swelling for the past year which is progressively worsened.  The pain he has is mild into the popliteal fossa.  Symptoms are worse with walking.  He has noticed recent instability.  He does not take any medication for the pain.  He does note paresthesias to the left lower extremity but has a history of lumbar radiculopathy as well as diabetic peripheral neuropathy.  He denies any injury or change in activity.  He has been seeing [place] for his left lower extremity as he has a history of radiculopathy.  He notes that he saw his back doctor this morning  he thinks he may also have symptoms coming from his knee.  Review of Systems as detailed in HPI.  All others reviewed and are negative.   Objective: Vital Signs: Ht '5\' 11"'$  (1.803 m)   Wt 268 lb (121.6 kg)   BMI 37.38 kg/m   Physical Exam well-developed well-nourished gentleman in no acute distress.  Alert and oriented x 3.  Ortho Exam left knee exam shows no effusion.  Range of motion 0 to 125 degrees.  No joint line tenderness.  Very mild tenderness popliteal fossa.  Ligaments are stable.  He is neurovascular intact distally.  Specialty Comments:  No specialty comments available.  Imaging: No new imaging   PMFS History: Patient Active Problem List   Diagnosis Date Noted   Medication side effect 11/29/2021   Eye pain, right 11/15/2021   Ear pain, referred, right 11/15/2021   Post-traumatic headache, not intractable 11/15/2021   Visit for suture removal 11/15/2021   At risk of fracture due to osteoporosis 07/08/2021   Closed fracture of neck of right femur ([place]) 07/08/2021   Dehydration 05/19/2021   Leukocytes in urine 05/19/2021   Osteoporotic fracture of hip with routine healing, subsequent encounter 04/23/2021   Rhabdomyolysis 04/23/2021   Acute kidney injury superimposed on CKD ([place]) 04/23/2021   Type 2  diabetes mellitus without complication, with long-term current use of insulin ([place]) 04/23/2021   Abnormality of gait 02/21/2021   Renal insufficiency 02/21/2021   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[place]) 11/17/2020   Aortic atherosclerosis ([place]) 11/17/2020   Coronary artery calcification 11/17/2020   Hypokalemia 11/17/2020   Obesity, diabetes, and hypertension syndrome ([place]) 09/23/2020   Other hyperlipidemia 09/23/2020   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 sciatica 03/15/2020   Diabetic peripheral neuropathy ([place]) 12/16/2019   Myalgia 12/16/2019   Transient visual disturbance 10/20/2019   Elevated TSH 10/09/2019   Stage 3a chronic kidney disease ([place]) 10/09/2019    Neuropathic pain 09/15/2019   Elevated LDL cholesterol level 07/10/2019   B12 deficiency 07/10/2019   Iron deficiency 07/10/2019   History of fall within past 90 days 07/10/2019   History of gout 07/10/2019   Skin lesion of left leg 07/10/2019   Frequent falls 06/18/2019   Gait disturbance 06/18/2019   Fall on same level from slipping, tripping, or stumbling 06/09/2019   Lung nodules 05/08/2019   Sleep disturbance 05/01/2019   Fall (on)(from) sidewalk curb, initial encounter 04/18/2019   Vitamin D deficiency 04/18/2019   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   Excessive cerumen in left ear canal 10/29/2018   Trigger finger, right ring finger 08/13/2018   Chest wall muscle strain 07/30/2018   Need for influenza vaccination 07/30/2018   Grieving 06/25/2018   ETD (Eustachian tube dysfunction), left 06/25/2018   Unilateral primary osteoarthritis, right knee 11/17/2016   Complex tear of lateral meniscus of right knee as current injury 10/26/2016   Radiculitis, lumbosacral 06/30/2016   Diabetes ([place]) 01/01/2016   Solitary pulmonary nodule 12/30/2015   Urolithiasis 02/23/2015   Paresthesia 09/07/2014   Routine general medical examination at a health care facility 07/07/2014   Disturbance of skin sensation 12/26/2013   UTI (urinary tract infection) 03/12/2013   Screening for prostate cancer 02/14/2012   Right knee pain 11/15/2011   Encounter for long-term (current) use of other medications 01/17/2011   Special screening examination for neoplasm of prostate 01/17/2011   MYCOSIS FUNGOIDES 12/29/2009   HEARING LOSS 09/30/2009   ECZEMA 09/30/2009   VITAMIN B12 DEFICIENCY 09/10/2009   MORBID OBESITY 09/10/2009   SYNCOPE 16/07/9603   DIASTOLIC DYSFUNCTION 54/06/8118   CARDIAC MURMUR, SYSTOLIC 14/78/2956   Pancytopenia ([place]) 05/12/2008   Disorder resulting from impaired renal function 05/12/2008   EDEMA 05/12/2008   Obstructive sleep apnea 11/04/2007   BACK PAIN, LUMBAR 10/23/2007   Dyslipidemia  05/20/2007   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   Essential hypertension 05/20/2007   Past Medical History:  Diagnosis Date   Allergy    Anal fissure    Anemia, unspecified    Arthritis    Spinal OA   BACK PAIN, LUMBAR 10/23/2007   CARDIAC MURMUR, SYSTOLIC 11/23/3084   DIABETES MELLITUS, TYPE I 5/78/4696   DIASTOLIC DYSFUNCTION 2/95/2841   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[place])    Duodenitis    Edema 05/12/2008   Esophageal stricture    GERD (gastroesophageal reflux disease)    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   Gynecomastia    Hepatic steatosis    HYPERLIPIDEMIA 05/20/2007   Hypertension    Hypogonadism male    Morbid obesity ([place]) 09/10/2009   OBSTRUCTIVE SLEEP APNEA 11/04/2007   Personal history of colonic polyps    RENAL INSUFFICIENCY 05/12/2008   Sleep apnea    uses cpap   Squamous cell carcinoma of skin 02/16/2020   left lower leg, anterior-cx3,62f   Urolithiasis     Family  History  Problem Relation Age of Onset   Colon cancer Brother 79   Colon polyps Brother    Lung cancer Brother    Other Mother        MVA, deceased 93s   Healthy Daughter    Healthy Son    Rectal cancer Neg Hx    Stomach cancer Neg Hx     Past Surgical History:  Procedure Laterality Date   Abdominal US  09/1997   arm fracture Left 1958   with hardware   Colon cancer screening     COLONOSCOPY  08/18/2004   diverticulitis   COLONOSCOPY  09/24/2009   ELECTROCARDIOGRAM  02/2006   FLEXIBLE SIGMOIDOSCOPY  03/04/2001   polyps, anal fissure   GANGLION CYST EXCISION Left 1980   HIP PINNING,CANNULATED Right 04/25/2021   Procedure: CANNULATED HIP PINNING;  Surgeon: Rod Can, MD;  Location: [facility];  Service: Orthopedics;  Laterality: Right;   Lower Arterial  04/13/2004   POLYPECTOMY     Rest Cardiolite  03/19/2003   Social History   Occupational History    Employer: [employer]  Tobacco Use   Smoking status: Former    Packs/day: 2.00    Years: 31.00    Total pack years: 62.00    Types: Cigarettes     Start date: 1955    Quit date: 10/23/1982    Years since quitting: 40.1   Smokeless tobacco: Never  Vaping Use   Vaping Use: Never used  Substance and Sexual Activity   Alcohol use: No    Alcohol/week: 0.0 standard drinks of alcohol   Drug use: No   Sexual activity: Not on file

## 2022-11-24 ENCOUNTER — Ambulatory Visit: Payer: Medicare Other | Admitting: Family Medicine

## 2022-11-27 ENCOUNTER — Encounter: Payer: Medicare Other | Admitting: Family Medicine

## 2022-11-29 ENCOUNTER — Ambulatory Visit: Payer: Medicare Other | Attending: Internal Medicine | Admitting: Internal Medicine

## 2022-11-29 ENCOUNTER — Encounter: Payer: Self-pay | Admitting: Internal Medicine

## 2022-11-29 VITALS — BP 142/80 | HR 77 | Ht 71.0 in | Wt 265.0 lb

## 2022-11-29 DIAGNOSIS — I10 Essential (primary) hypertension: Secondary | ICD-10-CM | POA: Diagnosis not present

## 2022-11-29 DIAGNOSIS — E1169 Type 2 diabetes mellitus with other specified complication: Secondary | ICD-10-CM

## 2022-11-29 DIAGNOSIS — I700 Atherosclerosis of aorta: Secondary | ICD-10-CM

## 2022-11-29 DIAGNOSIS — I152 Hypertension secondary to endocrine disorders: Secondary | ICD-10-CM

## 2022-11-29 DIAGNOSIS — I2584 Coronary atherosclerosis due to calcified coronary lesion: Secondary | ICD-10-CM

## 2022-11-29 DIAGNOSIS — M791 Myalgia, unspecified site: Secondary | ICD-10-CM

## 2022-11-29 DIAGNOSIS — G4733 Obstructive sleep apnea (adult) (pediatric): Secondary | ICD-10-CM | POA: Diagnosis not present

## 2022-11-29 DIAGNOSIS — N1831 Chronic kidney disease, stage 3a: Secondary | ICD-10-CM

## 2022-11-29 DIAGNOSIS — I2510 Atherosclerotic heart disease of native coronary artery without angina pectoris: Secondary | ICD-10-CM

## 2022-11-29 DIAGNOSIS — E669 Obesity, unspecified: Secondary | ICD-10-CM

## 2022-11-29 DIAGNOSIS — E1159 Type 2 diabetes mellitus with other circulatory complications: Secondary | ICD-10-CM

## 2022-11-29 DIAGNOSIS — T466X5A Adverse effect of antihyperlipidemic and antiarteriosclerotic drugs, initial encounter: Secondary | ICD-10-CM

## 2022-11-29 DIAGNOSIS — E782 Mixed hyperlipidemia: Secondary | ICD-10-CM | POA: Diagnosis not present

## 2022-11-29 LAB — LIPID PANEL
Chol/HDL Ratio: 4.3 ratio (ref 0.0–5.0)
Cholesterol, Total: 198 mg/dL (ref 100–199)
HDL: 46 mg/dL (ref >39)
LDL Chol Calc (NIH): 128 mg/dL — ABNORMAL HIGH (ref 0–99)
Triglycerides: 134 mg/dL (ref 0–149)
VLDL Cholesterol Cal: 24 mg/dL (ref 5–40)

## 2022-11-29 MED ORDER — AMLODIPINE BESYLATE 2.5 MG PO TABS: 2.5000 mg | ORAL_TABLET | Freq: Every day | ORAL | 3 refills | Status: DC

## 2022-11-29 NOTE — Patient Instructions (Signed)
Medication Instructions:  Your physician has recommended you make the following change in your medication:  START: amlodipine (Norvasc) 2.5 mg by mouth once daily  *If you need a refill on your cardiac medications before your next appointment, please call your pharmacy*   Lab Work: TODAY: FLP  If you have labs (blood work) drawn today and your tests are completely normal, you will receive your results only by: [place] (if you have MyChart) OR A paper copy in the mail If you have any lab test that is abnormal or we need to change your treatment, we will call you to review the results.   Testing/Procedures: NONE   Follow-Up: At [facility], you and your health needs are our priority.  As part of our continuing mission to provide you with exceptional heart care, we have created designated Provider Care Teams.  These Care Teams include your primary Cardiologist (physician) and Advanced Practice Providers (APPs -  Physician Assistants and Nurse Practitioners) who all work together to provide you with the care you need, when you need it.   Your next appointment:   1 year(s)  Provider:   Werner Lean, MD

## 2022-11-29 NOTE — Progress Notes (Signed)
Cardiology Office Note:    Date:  11/29/2022   ID:  [name], DOB 1941/10/22, MRN [id number]  PCP:  Libby Maw, MD  [facility] Cardiologist:  Werner Lean, MD  [place] Electrophysiologist:  None   CC: HTN follow up  History of Present Illness:    [name] is a 82 y.o. male with a hx of Type 1 Diabetes with HTN, HLD, Morbid Obesity, OSA on CPAP, CKD IIIa, known systolic heart murmur, who presents for evaluation in 2021. Prior to establishing care: Patient reports prior cardiac testing including 2010 echo, 2004 stress test (reportedly ok). 2022: Had benign syncope work up. 2023: Received notes from Dr. Royce Macadamia.  Creatinine now 1.84.  Now on 20 mg Twice per week.  Patient notes that he is doing OK.   Notes that he has been relative sedentary.   There are no interval hospital/ED visit.    No chest pain or pressure .  No SOB/DOE and no PND/Orthopnea.  No palpitations or syncope.  Notes leg swelling on long drive (before she passed when they drove to see her in [place])  Ambulatory blood pressure not done.   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   Gynecomastia    Hepatic steatosis    HYPERLIPIDEMIA 05/20/2007   Hypertension    Hypogonadism male    Morbid obesity ([place]) 09/10/2009   OBSTRUCTIVE SLEEP APNEA 11/04/2007   Personal history of colonic polyps    RENAL INSUFFICIENCY 05/12/2008   Sleep apnea    uses cpap   Squamous cell carcinoma of skin 02/16/2020   left lower leg, anterior-cx3,69f   Urolithiasis     Past Surgical History:  Procedure  Laterality Date   Abdominal UKorea 09/1997   arm fracture Left 1958   with hardware   Colon cancer screening     COLONOSCOPY  08/18/2004   diverticulitis   COLONOSCOPY  09/24/2009   ELECTROCARDIOGRAM  02/2006   FLEXIBLE SIGMOIDOSCOPY  03/04/2001   polyps, anal fissure   GANGLION CYST EXCISION Left 1980   HIP PINNING,CANNULATED Right 04/25/2021   Procedure: CANNULATED HIP PINNING;  Surgeon: SRod Can MD;  Location: [facility];  Service: Orthopedics;  Laterality: Right;   Lower Arterial  04/13/2004   POLYPECTOMY     Rest Cardiolite  03/19/2003    Current Medications: Current Meds  Medication Sig   Accu-Chek FastClix Lancets MISC USE TO CHECK BLOOD SUGAR UP TO 6 TIMES DAILY   alfuzosin (UROXATRAL) 10 MG 24 hr tablet TAKE 1 TABLET BY MOUTH  DAILY WITH BREAKFAST   amitriptyline (ELAVIL) 50 MG tablet TAKE ONE-HALF TABLET BY  MOUTH AT BEDTIME   amLODipine (NORVASC) 2.5 MG tablet Take 1 tablet (2.5 mg total) by mouth daily.   Blood Glucose Monitoring Suppl (ACCU-CHEK AVIVA PLUS) w/Device KIT Use as directed   cetirizine (ZYRTEC) 10 MG tablet Take 10 mg by mouth daily as needed for allergies.   Continuous Blood Gluc Sensor (DEXCOM G6 SENSOR) MISC Inject 1 Device into the skin as directed.  Apply to skin SQ every 10 days   diclofenac Sodium (VOLTAREN) 1 % GEL Apply 2 g topically 4 (four) times daily.   DULoxetine (CYMBALTA) 60 MG capsule Take 1 capsule (60 mg total) by mouth daily.   fluticasone (FLONASE) 50 MCG/ACT nasal spray Place 1 spray into the nose daily as needed for allergies.    folic acid (FOLVITE) 1 MG tablet Take 1 mg by mouth daily.   furosemide (LASIX) 20 MG tablet TAKE 1 TABLET BY MOUTH  DAILY   glucose blood (ACCU-CHEK AVIVA PLUS) test strip USE TO TEST BLOOD SUGAR 6  TIMES PER DAY; E11.9   Insulin Infusion Pump Supplies (AUTOSOFT XC INFUSION SET) MISC Inject 1 Act into the skin every other day. Use with 75m cannula and 23 inch tubing. *5 boxes (90 day supply)   Insulin Infusion  Pump Supplies (T:SLIM INSULIN CARTRIDGE 3ML) MISC Use with insulin pump, fill once every 2 days.   insulin lispro (HUMALOG) 100 UNIT/ML injection INJECT SUBCUTANEOUSLY VIA  INSULIN PUMP TOTAL OF 120  UNITS PER DAY   losartan (COZAAR) 25 MG tablet Take 12.5 mg by mouth daily.   Multiple Vitamins-Minerals (CENTRUM SILVER PO) Take 1 tablet by mouth daily.   Omega-3 Fatty Acids (FISH OIL) 1200 MG CAPS Take 1,200 mg by mouth daily.   omeprazole (PRILOSEC) 20 MG capsule Take 1 capsule (20 mg total) by mouth daily as needed.   rosuvastatin (CRESTOR) 40 MG tablet TAKE 1 TABLET BY MOUTH AT  BEDTIME   tiZANidine (ZANAFLEX) 4 MG tablet Take 1 tablet (4 mg total) by mouth 2 (two) times daily as needed for muscle spasms.   topiramate (TOPAMAX) 25 MG tablet Take 1 tablet (25 mg total) by mouth at bedtime.   vitamin B-12 (CYANOCOBALAMIN) 1000 MCG tablet Take 1,000 mcg by mouth daily.     Allergies:   Atorvastatin calcium, Lipitor [atorvastatin], Niacin, and Pioglitazone   Social History   Socioeconomic History   Marital status: Married    Spouse name: Not on file   Number of children: Not on file   Years of education: Not on file   Highest education level: Not on file  Occupational History    Employer: [employer]  Tobacco Use   Smoking status: Former    Packs/day: 2.00    Years: 31.00    Total pack years: 62.00    Types: Cigarettes    Start date: 89   Quit date: 10/23/1982    Years since quitting: 40.1   Smokeless tobacco: Never  Vaping Use   Vaping Use: Never used  Substance and Sexual Activity   Alcohol use: No    Alcohol/week: 0.0 standard drinks of alcohol   Drug use: No   Sexual activity: Not on file  Other Topics Concern   Not on file  Social History Narrative   Lives with wife in a one story home.  Has a son and a daughter.     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   Social Determinants of Health   Financial  Resource Strain: Low Risk  (01/10/2022)   Overall Financial Resource Strain (CARDIA)    Difficulty of Paying Living Expenses: Not hard at all  Food Insecurity: No Food Insecurity (01/10/2022)   Hunger Vital Sign    Worried About Running Out of Food in the Last Year: Never true    Ran Out of Food in the Last Year: Never  true  Transportation Needs: No Transportation Needs (01/10/2022)   PRAPARE - Hydrologist (Medical): No    Lack of Transportation (Non-Medical): No  Physical Activity: Inactive (01/10/2022)   Exercise Vital Sign    Days of Exercise per Week: 0 days    Minutes of Exercise per Session: 0 min  Stress: No Stress Concern Present (01/10/2022)   [place]    Feeling of Stress : Not at all  Social Connections: [place] (01/10/2022)   Social Connection and Isolation Panel [NHANES]    Frequency of Communication with Friends and Family: Twice a week    Frequency of Social Gatherings with Friends and Family: Twice a week    Attends Religious Services: 1 to 4 times per year    Active Member of [employer] or Organizations: Yes    Attends Archivist Meetings: 1 to 4 times per year    Marital Status: Married    Family History: The patient's family history includes Colon cancer (age of onset: 70) in his brother; Colon polyps in his brother; Healthy in his daughter and son; Lung cancer in his brother; Other in his mother. There is no history of Rectal cancer or Stomach cancer. No heart disease in the family  ROS:   Please see the history of present illness.    All other systems reviewed and are negative.  EKGs/Labs/Other Studies Reviewed:    The following studies were reviewed today:  EKG:   11/29/22: SR no heart block 09/21/2020:  SR 1st degree HB (PR 236) RBBB; BiFB  Cardiac Studies & Procedures       ECHOCARDIOGRAM  ECHOCARDIOGRAM COMPLETE  05/18/2021  Narrative ECHOCARDIOGRAM REPORT    Patient Name:   JUANITA DEVINCENT     Date of Exam: 05/18/2021 Medical Rec #:  [id number]     Height:       72.0 in Accession #:    [id number]    Weight:       262.0 lb Date of Birth:  [birth year]    BSA:          2.390 m Patient Age:    33 years      BP:           141/79 mmHg Patient Gender: M             HR:           86 bpm. 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 Risk Factors:Sleep Apnea, Hypertension and HLD.  Sonographer:    Marygrace Drought RCS Referring Phys: [id number] [place]  IMPRESSIONS   1. Left ventricular ejection fraction, by estimation, is 55 to 60%. The left ventricle has normal function. The left ventricle has no regional wall motion abnormalities. There is mild concentric left ventricular hypertrophy. Left ventricular diastolic parameters are consistent with Grade I diastolic dysfunction (impaired relaxation). 2. Right ventricular systolic function is normal. The right ventricular size is normal. Tricuspid regurgitation signal is inadequate for assessing PA pressure. 3. The mitral valve is normal in structure. No evidence of mitral valve regurgitation. 4. The aortic valve is tricuspid. Aortic valve regurgitation is not visualized. No aortic stenosis is present. 5. The inferior vena cava is normal  in size with greater than 50% respiratory variability, suggesting right atrial pressure of 3 mmHg.  Comparison(s): A prior study was performed on 10/17/2020. No significant change from prior study. Prior images reviewed side by side.  FINDINGS Left Ventricle: Left ventricular ejection fraction, by estimation, is 55 to 60%. The left ventricle has normal function. The left ventricle has no regional  wall motion abnormalities. The left ventricular internal cavity size was small. There is mild concentric left ventricular hypertrophy. Left ventricular diastolic parameters are consistent with Grade I diastolic dysfunction (impaired relaxation).  Right Ventricle: The right ventricular size is normal. Right vetricular wall thickness was not well visualized. Right ventricular systolic function is normal. Tricuspid regurgitation signal is inadequate for assessing PA pressure.  Left Atrium: Left atrial size was normal in size.  Right Atrium: Right atrial size was normal in size.  Pericardium: There is no evidence of pericardial effusion.  Mitral Valve: The mitral valve is normal in structure. No evidence of mitral valve regurgitation.  Tricuspid Valve: The tricuspid valve is grossly normal. Tricuspid valve regurgitation is not demonstrated. No evidence of tricuspid stenosis.  Aortic Valve: The aortic valve is tricuspid. Aortic valve regurgitation is not visualized. No aortic stenosis is present.  Pulmonic Valve: The pulmonic valve was not well visualized. Pulmonic valve regurgitation is not visualized. No evidence of pulmonic stenosis.  Aorta: The aortic root and ascending aorta are structurally normal, with no evidence of dilitation.  Venous: The inferior vena cava is normal in size with greater than 50% respiratory variability, suggesting right atrial pressure of 3 mmHg.  IAS/Shunts: The atrial septum is grossly normal.   LEFT VENTRICLE PLAX 2D LVIDd:         3.60 cm  Diastology LVIDs:         2.60 cm  LV e' medial:    3.15 cm/s LV PW:         1.10 cm  LV E/e' medial:  15.4 LV IVS:        1.10 cm  LV e' lateral:   5.33 cm/s LVOT diam:     2.00 cm  LV E/e' lateral: 9.1 LV SV:         51 LV SV Index:   21 LVOT Area:     3.14 cm   RIGHT VENTRICLE RV Basal diam:  2.90 cm RV S prime:     12.40 cm/s TAPSE (M-mode): 1.8 cm  LEFT ATRIUM             Index       RIGHT ATRIUM            Index LA diam:        3.40 cm 1.42 cm/m  RA Area:     12.40 cm LA Vol (A2C):   49.1 ml 20.55 ml/m RA Volume:   27.30 ml  11.42 ml/m LA Vol (A4C):   26.1 ml 10.92 ml/m LA Biplane Vol: 36.9 ml 15.44 ml/m AORTIC VALVE LVOT Vmax:   90.70 cm/s LVOT Vmean:  56.400 cm/s LVOT VTI:    0.162 m  AORTA Ao Root diam: 3.30 cm Ao Asc diam:  3.60 cm  MITRAL VALVE SHUNTS MV Decel Time:             Systemic VTI:  0.16 m MV E velocity: 48.40 cm/s  Systemic Diam: 2.00 cm MV A velocity: 86.10 cm/s MV E/A ratio:  0.56  Rudean Haskell MD Electronically signed by Rudean Haskell MD Signature Date/Time: 05/18/2021/1:38:09 PM  Final    MONITORS  CARDIAC EVENT MONITOR 11/07/2020  Narrative  Patient had a minimum heart rate of 59 bpm, maximum heart rate of 136 bpm, and average heart rate of 76 bpm.  Predominant underlying rhythm was sinus rhythm.  Isolated PACs were rare (<1.0%).  Isolated PVCs were rare (<1.0%).  No evidence of complete heart block or atrial fibrillation.  Triggered and diary events associated with sinus rhythm and sinus tachycardia.  No malignant arrhythmias.            Recent Labs: 10/24/2022: BUN 18; Creatinine, Ser 1.43; Hemoglobin 15.4; Platelets 178.0; Potassium 4.1; Sodium 142  Recent Lipid Panel    Component Value Date/Time   CHOL 135 02/28/2021 0953   TRIG 174.0 (H) 02/28/2021 0953   HDL 33.60 (L) 02/28/2021 0953   CHOLHDL 4 02/28/2021 0953   VLDL 34.8 02/28/2021 0953   LDLCALC 66 02/28/2021 0953   LDLDIRECT 66.0 02/21/2022 1116    Physical Exam:    VS:  BP (!) 142/80   Pulse 77   Ht '5\' 11"'$  (1.803 m)   Wt 265 lb (120.2 kg)   SpO2 98%   BMI 36.96 kg/m     Wt Readings from Last 3 Encounters:  11/29/22 265 lb (120.2 kg)  11/21/22 268 lb (121.6 kg)  11/21/22 270 lb (122.5 kg)    GEN:  Morbid obesity no acute distress HEENT: Normal NECK: No JVD CARDIAC: RRR, no murmurs, rubs, gallops RESPIRATORY:  Clear to auscultation  without rales, wheezing or rhonchi  ABDOMEN: Soft, non-tender, non-distended MUSCULOSKELETAL:  Trace bilateral edema; No deformity  SKIN: Warm and dry, insulin pump site c/d/i NEUROLOGIC:  Alert and oriented x 3 PSYCHIATRIC:  Normal affect   ASSESSMENT:    1. Essential hypertension   2. Mixed hyperlipidemia   3. Obstructive sleep apnea   4. Obesity, diabetes, and hypertension syndrome ([place])   5. Coronary artery calcification   6. Aortic atherosclerosis ([place])   7. Myalgia due to statin     PLAN:    HTN Morbid Obesity CKD stage IIIB OSA on CPAP - continue ARB - adding low dose norvasc and he will monitor amb BP and leg swelling - discussed exercise at the [facility] in the pool given knee pain, love of fishing but it being to cold to fish - low threshold to add SGLT2i (cost would be the issues) - lasix managed primarily by Dr. Royce Macadamia [facility])  HLD CAC and aortic atherosclerosis FHX of CAD - discussed is rosuvastatin and atorvastatin myalgias in the past - fasting lipids today - discuss dietary changes around eating out and health fish preparation - may add zetia 10 mg  Last aortic dimensions were WNL for age, gender, and BSA  One year f/u me or APP  Medication Adjustments/Labs and Tests Ordered: Current medicines are reviewed at length with the patient today.  Concerns regarding medicines are outlined above.  Orders Placed This Encounter  Procedures   Lipid panel   EKG 12-Lead   Meds ordered this encounter  Medications   amLODipine (NORVASC) 2.5 MG tablet    Sig: Take 1 tablet (2.5 mg total) by mouth daily.    Dispense:  90 tablet    Refill:  3    Patient Instructions  Medication Instructions:  Your physician has recommended you make the following change in your medication:  START: amlodipine (Norvasc) 2.5 mg by mouth once daily  *If you need a refill on your cardiac medications before your next appointment,  please call your pharmacy*   Lab  Work: TODAY: FLP  If you have labs (blood work) drawn today and your tests are completely normal, you will receive your results only by: [place] (if you have MyChart) OR A paper copy in the mail If you have any lab test that is abnormal or we need to change your treatment, we will call you to review the results.   Testing/Procedures: NONE   Follow-Up: At [facility], you and your health needs are our priority.  As part of our continuing mission to provide you with exceptional heart care, we have created designated Provider Care Teams.  These Care Teams include your primary Cardiologist (physician) and Advanced Practice Providers (APPs -  Physician Assistants and Nurse Practitioners) who all work together to provide you with the care you need, when you need it.   Your next appointment:   1 year(s)  Provider:   Werner Lean, MD       Signed, Werner Lean, MD  11/29/2022 10:48 AM    [place]

## 2022-12-01 ENCOUNTER — Telehealth: Payer: Self-pay | Admitting: Internal Medicine

## 2022-12-01 DIAGNOSIS — E782 Mixed hyperlipidemia: Secondary | ICD-10-CM

## 2022-12-01 DIAGNOSIS — I2510 Atherosclerotic heart disease of native coronary artery without angina pectoris: Secondary | ICD-10-CM

## 2022-12-01 DIAGNOSIS — I700 Atherosclerosis of aorta: Secondary | ICD-10-CM

## 2022-12-01 MED ORDER — EZETIMIBE 10 MG PO TABS: 10.0000 mg | ORAL_TABLET | Freq: Every day | ORAL | 3 refills | Status: DC

## 2022-12-01 NOTE — Telephone Encounter (Signed)
The patient has been notified of the result and verbalized understanding.  All questions (if any) were answered. Precious Gilding, RN 12/01/2022 4:46 PM   Pt will come in for [place] on 03/13/23.

## 2022-12-01 NOTE — Telephone Encounter (Signed)
Patient is returning RN's call for lab results. Please advise.

## 2022-12-01 NOTE — Telephone Encounter (Signed)
-----   Message from Werner Lean, MD sent at 12/01/2022  1:46 PM EST ----- Results: LDL above goal off statin Plan: Zetia 10 mg  Werner Lean, MD

## 2022-12-06 NOTE — Progress Notes (Signed)
This encounter was created in error - please disregard.

## 2022-12-15 ENCOUNTER — Other Ambulatory Visit (INDEPENDENT_AMBULATORY_CARE_PROVIDER_SITE_OTHER): Payer: Medicare Other

## 2022-12-15 DIAGNOSIS — E1165 Type 2 diabetes mellitus with hyperglycemia: Secondary | ICD-10-CM | POA: Diagnosis not present

## 2022-12-15 DIAGNOSIS — E7849 Other hyperlipidemia: Secondary | ICD-10-CM | POA: Diagnosis not present

## 2022-12-15 DIAGNOSIS — Z794 Long term (current) use of insulin: Secondary | ICD-10-CM | POA: Diagnosis not present

## 2022-12-15 LAB — COMPREHENSIVE METABOLIC PANEL
ALT: 24 U/L (ref 0–53)
AST: 20 U/L (ref 0–37)
Albumin: 4.4 g/dL (ref 3.5–5.2)
Alkaline Phosphatase: 98 U/L (ref 39–117)
BUN: 26 mg/dL — ABNORMAL HIGH (ref 6–23)
CO2: 27 mEq/L (ref 19–32)
Calcium: 10 mg/dL (ref 8.4–10.5)
Chloride: 100 mEq/L (ref 96–112)
Creatinine, Ser: 2.19 mg/dL — ABNORMAL HIGH (ref 0.40–1.50)
GFR: 27.62 mL/min — ABNORMAL LOW (ref >60.00)
Glucose, Bld: 171 mg/dL — ABNORMAL HIGH (ref 70–99)
Potassium: 3.8 mEq/L (ref 3.5–5.1)
Sodium: 140 mEq/L (ref 135–145)
Total Bilirubin: 0.5 mg/dL (ref 0.2–1.2)
Total Protein: 7.7 g/dL (ref 6.0–8.3)

## 2022-12-15 LAB — LDL CHOLESTEROL, DIRECT: Direct LDL: 73 mg/dL

## 2022-12-15 LAB — MICROALBUMIN / CREATININE URINE RATIO
Creatinine,U: 113.9 mg/dL
Microalb Creat Ratio: 6.6 mg/g (ref 0.0–30.0)
Microalb, Ur: 7.5 mg/dL — ABNORMAL HIGH (ref 0.0–1.9)

## 2022-12-15 LAB — HEMOGLOBIN A1C: Hgb A1c MFr Bld: 7.1 % — ABNORMAL HIGH (ref 4.6–6.5)

## 2022-12-19 ENCOUNTER — Encounter: Payer: Self-pay | Admitting: Endocrinology

## 2022-12-19 ENCOUNTER — Ambulatory Visit: Payer: Medicare Other | Admitting: Endocrinology

## 2022-12-19 VITALS — BP 118/66 | HR 104 | Ht 71.0 in | Wt 261.8 lb

## 2022-12-19 DIAGNOSIS — N1831 Chronic kidney disease, stage 3a: Secondary | ICD-10-CM

## 2022-12-19 DIAGNOSIS — Z794 Long term (current) use of insulin: Secondary | ICD-10-CM

## 2022-12-19 DIAGNOSIS — E1165 Type 2 diabetes mellitus with hyperglycemia: Secondary | ICD-10-CM | POA: Diagnosis not present

## 2022-12-19 DIAGNOSIS — E782 Mixed hyperlipidemia: Secondary | ICD-10-CM

## 2022-12-19 NOTE — Patient Instructions (Signed)
[place] 5-10 min before each meals

## 2022-12-19 NOTE — Progress Notes (Signed)
Patient ID: [name], male   DOB: April 02, 1941, 82 y.o.   MRN: [id number]           Reason for Appointment: Type II Diabetes follow-up   History of Present Illness   Diagnosis date: 1986  Previous history:  Insulin was started in 1995  A1c range in the last few years is: 6.8-8.4  Recent history:     Non-insulin hypoglycemic drugs: None     Insulin pump: T-slim control IQ        BASAL settings: 12-8 am = 1.65 8-12 noon= 1.8 Noon to 12 MN = 1.75  Carb 1:4 lunch and dinner Carb ratio 1:3 breakfast   Active insulin 3 hours, correction factor I: 40  Current self management, blood sugar patterns and problems identified:  A1c is 7.1, previously was down to 6.8 His blood sugars are overall reasonably well-controlled However as before he has high postprandial readings at times He does admit that he may not take his boluses consistently at mealtimes Several high sugars after meals are related to missed boluses Also appears to be mostly getting higher after lunch despite boluses  He is still requiring relatively large amount of bolus insulin with carbohydrate ratios of 1: 3 and 4  Currently not using sleep mode overnight and overnight sugars are well-controlled Also no hypoglycemia at all He did not have his sensor on consistently but mostly is in control IQ mode when he has the Dexcom active, overall control IQ of 26% of the time  Exercise: Minimal, limited by chronic back pain  Side effects from medications: Diarrhea from Ozempic     CGM data from the Dexcom sensor on his home shows the following interpretation for 2 weeks  His time in range has been set to glucose range of 70-200  Overall blood sugars are within the target range on an average except early afternoon and early evening  Overnight blood sugars are mildly variable but on an average very consistently around 120-140 average and overall overnight average 133 only.  No hypoglycemia POSTPRANDIAL readings are  generally variable but most consistently higher after lunch and variably high after dinner Some of the blood sugars postprandially are not preceded by boluses His blood sugars occasionally may rise higher in the evenings compared to after lunch but this is inconsistent also  No hypoglycemia seen at any time   Statistics:     CGM use % of time   2-week average/SD 167/49  Time in range     77   %  % Time Above 200 23  % Time above 250   % Time Below 70       PRE-MEAL  overnight  mornings  afternoon  evening Overall  Glucose range:       Averages: 133 148 192 189 167   Previously  CGM use % of time 100  2-week average/SD 188  Time in range       63%  % Time Above 200 36  % Time above 250   % Time Below 70 0      PRE-MEAL  overnight  mornings  afternoon  evening Overall  Glucose range:       Averages: 162 172  202 219    Weight history:  Wt Readings from Last 3 Encounters:  12/19/22 261 lb 12.8 oz (118.8 kg)  11/29/22 265 lb (120.2 kg)  11/21/22 268 lb (121.6 kg)            Diabetes labs:  Lab Results  Component Value Date   HGBA1C 7.1 (H) 12/15/2022   HGBA1C 6.8 (A) 08/23/2022   HGBA1C 7.6 (H) 05/18/2022   Lab Results  Component Value Date   MICROALBUR 7.5 (H) 12/15/2022   LDLCALC 128 (H) 11/29/2022   CREATININE 2.19 (H) 12/15/2022    No results found for: "FRUCTOSAMINE"   Allergies as of 12/19/2022       Reactions   Atorvastatin Calcium    Lipitor [atorvastatin] Other (See Comments)   unknown   Niacin    REACTION: Severe heartburn   Pioglitazone    REACTION: Edema        Medication List        Accurate as of December 19, 2022 11:12 AM. If you have any questions, ask your nurse or doctor.          Accu-Chek Aviva Plus w/Device Kit Use as directed   Accu-Chek FastClix Lancets Misc USE TO CHECK BLOOD SUGAR UP TO 6 TIMES DAILY   alfuzosin 10 MG 24 hr tablet Commonly known as: UROXATRAL TAKE 1 TABLET BY MOUTH  DAILY WITH  BREAKFAST   amitriptyline 50 MG tablet Commonly known as: ELAVIL TAKE ONE-HALF TABLET BY  MOUTH AT BEDTIME   amLODipine 2.5 MG tablet Commonly known as: NORVASC Take 1 tablet (2.5 mg total) by mouth daily.   CENTRUM SILVER PO Take 1 tablet by mouth daily.   cetirizine 10 MG tablet Commonly known as: ZYRTEC Take 10 mg by mouth daily as needed for allergies.   cyanocobalamin 1000 MCG tablet Commonly known as: VITAMIN B12 Take 1,000 mcg by mouth daily.   Dexcom G6 Sensor Misc Inject 1 Device into the skin as directed. Apply to skin SQ every 10 days   diclofenac Sodium 1 % Gel Commonly known as: Voltaren Apply 2 g topically 4 (four) times daily.   DULoxetine 60 MG capsule Commonly known as: CYMBALTA Take 1 capsule (60 mg total) by mouth daily.   ezetimibe 10 MG tablet Commonly known as: ZETIA Take 1 tablet (10 mg total) by mouth daily.   Fish Oil 1200 MG Caps Take 1,200 mg by mouth daily.   fluticasone 50 MCG/ACT nasal spray Commonly known as: FLONASE Place 1 spray into the nose daily as needed for allergies.   folic acid 1 MG tablet Commonly known as: FOLVITE Take 1 mg by mouth daily.   furosemide 20 MG tablet Commonly known as: LASIX TAKE 1 TABLET BY MOUTH  DAILY   glucose blood test strip Commonly known as: Accu-Chek Aviva Plus USE TO TEST BLOOD SUGAR 6  TIMES PER DAY; E11.9   insulin lispro 100 UNIT/ML injection Commonly known as: HumaLOG INJECT SUBCUTANEOUSLY VIA  INSULIN PUMP TOTAL OF 120  UNITS PER DAY   losartan 25 MG tablet Commonly known as: COZAAR Take 12.5 mg by mouth daily.   omeprazole 20 MG capsule Commonly known as: PRILOSEC Take 1 capsule (20 mg total) by mouth daily as needed.   rosuvastatin 40 MG tablet Commonly known as: CRESTOR TAKE 1 TABLET BY MOUTH AT  BEDTIME   T:slim Insulin Cartridge 60m Misc Use with insulin pump, fill once every 2 days.   AutoSoft XC Infusion Set Misc Inject 1 Act into the skin every other day. Use  with 980mcannula and 23 inch tubing. *5 boxes (90 day supply)   tiZANidine 4 MG tablet Commonly known as: Zanaflex Take 1 tablet (4 mg total) by mouth 2 (two) times daily as needed for muscle spasms.  topiramate 25 MG tablet Commonly known as: Topamax Take 1 tablet (25 mg total) by mouth at bedtime.        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[place])    Duodenitis    Edema 05/12/2008   Esophageal stricture    GERD (gastroesophageal reflux disease)    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   Gynecomastia    Hepatic steatosis    HYPERLIPIDEMIA 05/20/2007   Hypertension    Hypogonadism male    Morbid obesity ([place]) 09/10/2009   OBSTRUCTIVE SLEEP APNEA 11/04/2007   Personal history of colonic polyps    RENAL INSUFFICIENCY 05/12/2008   Sleep apnea    uses cpap   Squamous cell carcinoma of skin 02/16/2020   left lower leg, anterior-cx3,64f   Urolithiasis     Past Surgical History:  Procedure Laterality Date   Abdominal [place] 09/1997   arm fracture Left 1958   with hardware   Colon cancer screening     COLONOSCOPY  08/18/2004   diverticulitis   COLONOSCOPY  09/24/2009   ELECTROCARDIOGRAM  02/2006   FLEXIBLE SIGMOIDOSCOPY  03/04/2001   polyps, anal fissure   GANGLION CYST EXCISION Left 1980   HIP PINNING,CANNULATED Right 04/25/2021   Procedure: CANNULATED HIP PINNING;  Surgeon: SRod Can MD;  Location: [facility];  Service: Orthopedics;  Laterality: Right;   Lower Arterial  04/13/2004   POLYPECTOMY     Rest Cardiolite  03/19/2003     Family History  Problem Relation Age of Onset   Colon cancer Brother 59  Colon polyps Brother    Lung cancer Brother    Other Mother        MVA, deceased 587s  Healthy Daughter    Healthy Son    Rectal cancer Neg Hx    Stomach cancer Neg Hx     Social History:  reports that he quit smoking about 40 years ago. His smoking use included cigarettes. He started smoking about 69 years ago. He has a 62.00 pack-year smoking history. He has never used smokeless tobacco. He reports that he does not drink alcohol and does not use drugs.  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  Lab Results  Component Value Date   CREATININE 2.19 (H) 12/15/2022   CREATININE 1.43 10/24/2022   CREATININE 1.70 (H) 05/18/2022     Lipid management: Has been treated with Crestor 40 mg daily by his cardiologist and recently Zetia added, not clear why LDL was much higher than usual With starting Zetia LDL is down to 73    Lab Results  Component Value Date  CHOL 198 11/29/2022   CHOL 135 02/28/2021   CHOL 116 07/10/2019   Lab Results  Component Value Date   HDL 46 11/29/2022   HDL 33.60 (L) 02/28/2021   HDL 32.20 (L) 07/10/2019   Lab Results  Component Value Date   LDLCALC 128 (H) 11/29/2022   LDLCALC 66 02/28/2021   LDLCALC 57 07/10/2019   Lab Results  Component Value Date   TRIG 134 11/29/2022   TRIG 174.0 (H) 02/28/2021   TRIG 137.0 07/10/2019   Lab Results  Component Value Date   CHOLHDL 4.3 11/29/2022   CHOLHDL 4 02/28/2021   CHOLHDL 4 07/10/2019   Lab Results  Component Value Date   LDLDIRECT 73.0 12/15/2022   LDLDIRECT 66.0 02/21/2022    LDLDIRECT 76.0 08/24/2021     Examination:   BP 118/66 (BP Location: Left Arm, Patient Position: Sitting, Cuff Size: Normal)   Pulse (!) 104   Ht '5\' 11"'$  (1.803 m)   Wt 261 lb 12.8 oz (118.8 kg)   SpO2 96%   BMI 36.51 kg/m   Body mass index is 36.51 kg/m.    ASSESSMENT/ PLAN:    Diabetes type 2 insulin-dependent:   Current regimen: T-insulin insulin pump with control IQ  See history of present illness for detailed discussion of current diabetes management, blood sugar patterns and problems identified  A1c is 7.1  Blood sugars are better than his last visit although A1c is not as good However most of his hyperglycemia is postprandial as discussed above from late or inadequate boluses both at lunch and dinner  Again discussed timing of boluses and need for remembering to do this  Recommendations:  No change in basal rate settings Carbohydrate ratio 1:3 at lunch also, this was changed for him as he is unable to make changes in his settings himself He will set a reminder on his phone to bolus for lunch consistently Otherwise try to bolus 10 minutes before eating He can leave off to sleep mode as this will allow correction when needed To call if he is having any significant hypoglycemia  CHRONIC KIDNEY disease: Still may be a candidate for Jardiance or Farxiga, defer to nephrologist  LIPIDS: LDL much better with adding Zetia   There are no Patient Instructions on file for this visit.   Elayne Snare 12/19/2022, 11:12 AM

## 2022-12-22 ENCOUNTER — Encounter: Payer: Self-pay | Admitting: Endocrinology

## 2022-12-22 ENCOUNTER — Telehealth: Payer: Self-pay

## 2022-12-22 DIAGNOSIS — E1142 Type 2 diabetes mellitus with diabetic polyneuropathy: Secondary | ICD-10-CM

## 2022-12-22 LAB — HM DIABETES EYE EXAM

## 2022-12-22 MED ORDER — QUTENZA (4 PATCH) 8 % EX KIT: 4.0000 | PACK | Freq: Once | CUTANEOUS | 0 refills | Status: AC

## 2023-01-02 ENCOUNTER — Encounter: Payer: Self-pay | Admitting: Family Medicine

## 2023-01-03 NOTE — Telephone Encounter (Signed)
PA for Gilman Schmidt (Key: [id number])

## 2023-01-05 NOTE — Telephone Encounter (Signed)
Appeal Approved for patches, Will be delivered to our office on Monday 01/08/23

## 2023-01-18 ENCOUNTER — Encounter
Payer: Medicare Other | Attending: Physical Medicine and Rehabilitation | Admitting: Physical Medicine and Rehabilitation

## 2023-01-18 ENCOUNTER — Telehealth: Payer: Self-pay | Admitting: Family Medicine

## 2023-01-18 ENCOUNTER — Encounter: Payer: Self-pay | Admitting: Physical Medicine and Rehabilitation

## 2023-01-18 VITALS — BP 144/79 | HR 86 | Temp 97.0°F | Ht 71.0 in | Wt 264.0 lb

## 2023-01-18 DIAGNOSIS — E1142 Type 2 diabetes mellitus with diabetic polyneuropathy: Secondary | ICD-10-CM | POA: Diagnosis not present

## 2023-01-18 DIAGNOSIS — M1A9XX0 Chronic gout, unspecified, without tophus (tophi): Secondary | ICD-10-CM | POA: Diagnosis present

## 2023-01-18 DIAGNOSIS — M47819 Spondylosis without myelopathy or radiculopathy, site unspecified: Secondary | ICD-10-CM | POA: Diagnosis not present

## 2023-01-18 MED ORDER — CAPSAICIN-CLEANSING GEL 8 % EX KIT
4.0000 | PACK | Freq: Once | CUTANEOUS | Status: AC
  Administered 2023-01-18: 4 via TOPICAL

## 2023-01-18 NOTE — Progress Notes (Signed)
Subjective:    Patient ID: [name], male    DOB: Mar 17, 1941, 82 y.o.   MRN: [id number]   HPI  Male with pmh of OSA, CKD, HTN, DM type 1 who presents for f/u of diabetic peripheral neuropathy and low back pain > neck pain.   On first visit, pt complained of b/l L>R back pain.  Started ~>20 years ago.  No inciting event.  Sitting/laying improve the pain.  Standing exacerbates the pain.  Sharp, burning pain.  Radiates to left posterior thigh.  Intermittent.  He has associated numbness/tingling.  He only tried ASA, which helps some.  Pain limits from doing ADLs and fishing.  He denies falls. Back pain has been ok especially if sitting down Can't stand more than 5 or 10 minutes Tried PT and cannot feel difference -pain is worst on left.  Last Qutenza patch helped a lot for his back but he was charged $700, [place] sent the patches to speciality pharmacy this time and they were 100% covered Qutenza patches helped on feet where he has tingling. He has less pain in his feet except for his toes which he thinks is due to gout [place] since he was ere last, landed on his knees He is doing well decreasing snacking after dinner 169 CBG right now.  -he usually snacks at night on potato chips after dinner -does not eat much more baked potatoes Voltaren cream helps the back pain but does not stop it When he fishes he feels pain from the jolting- fishing made it worse -He takes advil and this helps a little.  Sometimes it moves into his legs.   Last clinic visit on 10/21/2020.  He had trigger point injections at that time.  Since that time, patient states he had great benefit for about a week.  He uses a TENS with benefit. Patient states he feels he is getting weaker. He is taking Baclofen 1-2/week.  Sleep has improved. He is taking Elavil 25.  He states he believe he lost weight, but not exercising. Denies falls. Lightheadedness has improved.    He does not take any other medications for his pain and does  not desire to. He requires a cane for ambulation and has a handicap placard.   Blood pressure is better controlled today at 120/74  He has been to multiple speciialists and was told he will not be able to see from this eye after her had a fall on this eye Has not been using SPRINT PNS but it did help when he used it.  Has been having headaches.  -he is ready to try Qutenza today -he has numbness and tingling from his shins down to his feet and it is constant and feels uncomfortable -his back pain has also been severe -wife accompanies him today -he has decreased sensation in both feet   Pain Inventory Average Pain 4 Pain Right Now 0 My pain is intermittent, sharp, and tingling  In the last 24 hours, has pain interfered with the following? General activity 3 Relation with others 0 Enjoyment of life 3 What TIME of day is your pain at its worst? daytime Sleep (in general) Fair  Pain is worse with: walking and standing Pain improves with: heat/ice Relief from Meds: 0     Family History  Problem Relation Age of Onset   Colon cancer Brother 23   Colon polyps Brother    Lung cancer Brother    Other Mother  MVA, deceased 36s   Healthy Daughter    Healthy Son    Rectal cancer Neg Hx    Stomach cancer Neg Hx    Social History   Socioeconomic History   Marital status: Married    Spouse name: Not on file   Number of children: Not on file   Years of education: Not on file   Highest education level: Not on file  Occupational History    Employer: [employer]  Tobacco Use   Smoking status: Former    Packs/day: 2.00    Years: 31.00    Additional pack years: 0.00    Total pack years: 62.00    Types: Cigarettes    Start date: 6    Quit date: 10/23/1982    Years since quitting: 40.2   Smokeless tobacco: Never  Vaping Use   Vaping Use: Never used  Substance and Sexual Activity   Alcohol use: No    Alcohol/week: 0.0 standard drinks of alcohol   Drug use:  No   Sexual activity: Not on file  Other Topics Concern   Not on file  Social History Narrative   Lives with wife in a one story home.  Has a son and a daughter.     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     Education: 2 years of college.   Social Determinants of Health   Financial Resource Strain: Low Risk  (01/10/2022)   Overall Financial Resource Strain (CARDIA)    Difficulty of Paying Living Expenses: Not hard at all  Food Insecurity: No Food Insecurity (01/10/2022)   Hunger Vital Sign    Worried About Running Out of Food in the Last Year: Never true    Ran Out of Food in the Last Year: Never true  Transportation Needs: No Transportation Needs (01/10/2022)   PRAPARE - Hydrologist (Medical): No    Lack of Transportation (Non-Medical): No  Physical Activity: Inactive (01/10/2022)   Exercise Vital Sign    Days of Exercise per Week: 0 days    Minutes of Exercise per Session: 0 min  Stress: No Stress Concern Present (01/10/2022)   [place]    Feeling of Stress : Not at all  Social Connections: [place] (01/10/2022)   Social Connection and Isolation Panel [NHANES]    Frequency of Communication with Friends and Family: Twice a week    Frequency of Social Gatherings with Friends and Family: Twice a week    Attends Religious Services: 1 to 4 times per year    Active Member of [employer] or Organizations: Yes    Attends Archivist Meetings: 1 to 4 times per year    Marital Status: Married   Past Surgical History:  Procedure Laterality Date   Abdominal US  09/1997   arm fracture Left 1958   with hardware   Colon cancer screening     COLONOSCOPY  08/18/2004   diverticulitis   COLONOSCOPY  09/24/2009   ELECTROCARDIOGRAM  02/2006   FLEXIBLE SIGMOIDOSCOPY  03/04/2001   polyps, anal fissure   GANGLION CYST EXCISION Left 1980   HIP PINNING,CANNULATED Right  04/25/2021   Procedure: CANNULATED HIP PINNING;  Surgeon: Rod Can, MD;  Location: [facility];  Service: Orthopedics;  Laterality: Right;   Lower Arterial  04/13/2004   POLYPECTOMY     Rest Cardiolite  03/19/2003   Past Medical History:  Diagnosis Date  Allergy    Anal fissure    Anemia, unspecified    Arthritis    Spinal OA   BACK PAIN, LUMBAR 10/23/2007   CARDIAC MURMUR, SYSTOLIC [id number]   DIABETES MELLITUS, TYPE I [id number]   DIASTOLIC DYSFUNCTION [id number]   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[place])    Duodenitis    Edema 05/12/2008   Esophageal stricture    GERD (gastroesophageal reflux disease)    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   Gynecomastia    Hepatic steatosis    HYPERLIPIDEMIA 05/20/2007   Hypertension    Hypogonadism male    Morbid obesity ([place]) 09/10/2009   OBSTRUCTIVE SLEEP APNEA 11/04/2007   Personal history of colonic polyps    RENAL INSUFFICIENCY 05/12/2008   Sleep apnea    uses cpap   Squamous cell carcinoma of skin 02/16/2020   left lower leg, anterior-cx3,19fu   Urolithiasis    BP (!) 144/79   Pulse 86   Temp (!) 97 F (36.1 C)   Ht 5\' 11"  (1.803 m)   Wt 264 lb (119.7 kg)   SpO2 98%   BMI 36.82 kg/m   Opioid Risk Score:   Fall Risk Score:  `1  Depression screen [facility] 2/9     01/18/2023   10:17 AM 11/21/2022   10:42 AM 09/19/2022   11:45 AM 05/24/2022   10:30 AM 02/16/2022    9:48 AM 01/10/2022    1:43 PM 01/10/2022    1:42 PM  Depression screen PHQ 2/9  Decreased Interest 0 0 0 0 0 0 0  Down, Depressed, Hopeless 0 0 0 0 0 0 0  PHQ - 2 Score 0 0 0 0 0 0 0    Review of Systems  HENT: Negative.    Eyes: Negative.   Respiratory:  Positive for apnea and shortness of breath.   Cardiovascular: Negative.   Gastrointestinal: Negative.   Endocrine:       Diabetic High blood sugar  Genitourinary: Negative.           Musculoskeletal:  Positive for arthralgias, back pain, gait problem, myalgias and neck pain.  Skin: Negative.    Allergic/Immunologic: Negative.   Neurological:  Positive for weakness and numbness.       Tingling  Hematological: Negative.   All other systems reviewed and are negative.     Objective:   Physical Exam  Constitutional: No distress . Vital signs reviewed. BMI 36.82, weight 264 lbs, 144/79 Gen: no distress, normal appearing HEENT: oral mucosa pink and moist, NCAT Cardio: Reg rate Chest: normal effort, normal rate of breathing Abd: soft, non-distended Ext: no edema Psych: pleasant, normal affect Skin: intact Musc: TTP lumbar spinous process Musc: Gait: Mildly antalgic. Ambulating with cane at all times.  Neuro: Alert  HOH             Strength          5/5 in all LE myotomes Decreased sensation in bilateral feet    Assessment & Plan:  Male with pmh of OSA, CKD, HTN, DM type 1 with peripheral neuropathy presents for follow up of low back pain > neck pain.     1. Chronic mechanical low back pain             MRIs C,L-spine early 2016 suggesting multilevel spondylosis with mild b/l multilevel foraminal narrowing C4-C7 and shallow disc bulge at L5-S1 without central canal or foraminal stenosis.              Avoid  NSAIDs due to CKD, discussed with patient since he takes Advil  Continue voltaren gel, advised that he could use up to 4 times per day.              Unable to tolerate Gabapentin, Lyrica             Cont HEP  Discussed repeat MIR of lumbar spine  Continue TENS             Aquatic therapy, not going anymore, states ineffective             Cont tylenol  Robaxin 500 TID PRN, changed to Baclofen 10 TID PRN due to change insurance, continue   Continue Cymbalta 60mg              Cont back brace during periods of excessive activity, reminded to avoid prolonged use  Continue Lidoderm OTC, reminded              Encouraged rest breaks             Acknowledges he can do more if he make a conscious effort             PT completed    Pain over spinous process where clothing is  resulting increased pressure.  Encourage change in clothing.   Sprint PNS removed today.   -Provided with a pain relief journal and discussed that it contains foods and lifestyle tips to naturally help to improve pain. Discussed that these lifestyle strategies are also very good for health unlike some medications which can have negative side effects. Discussed that the act of keeping a journal can be therapeutic and helpful to realize patterns what helps to trigger and alleviate pain.  -Discussed Qutenza as an option for neuropathic pain control. Discussed that this is a capsaicin patch, stronger than capsaicin cream. Discussed that it is currently approved for diabetic peripheral neuropathy and post-herpetic neuralgia, but that it has also shown benefit in treating other forms of neuropathy. Provided patient with link to site to learn more about the patch: [employer]. Discussed that the patch would be placed in office and benefits usually last 3 months. Discussed that unintended exposure to capsaicin can cause severe irritation of eyes, mucous membranes, respiratory tract, and skin, but that Qutenza is a local treatment and does not have the systemic side effects of other nerve medications. Discussed that there may be pain, itching, erythema, and decreased sensory function associated with the application of Qutenza. Side effects usually subside within 1 week. A cold pack of analgesic medications can help with these side effects. Blood pressure can also be increased due to pain associated with administration of the patch.   2. Sacroiliitis             See #1             Continue brace             Not main pain generator at present, does not wish to proceed with injection at this time, particularly due to DM   3. Sleep disturbance             Recently new mattress             Continue CPAP             Improved  Insomnia: -Try to go outside near sunrise -Get exercise during the day.   -Turn off all devices an hour before bedtime.  -Teas that can benefit: chamomile,  valerian root, Brahmi (Bacopa) -Can consider over the counter melatonin, magnesium, and/or L-theanine. Melatonin is an anti-oxidant with multiple health benefits. Magnesium is involved in greater than 300 enzymatic reactions in the body and most of [place] are deficient as our soil is often depleted. There are 7 different types of magnesium- Bioptemizer's is a supplement with all 7 types, and each has unique benefits. Magnesium can also help with constipation and anxiety.  -Pistachios naturally increase the production of melatonin -[employer] bamboo bed sheets are free from toxic chemicals.  -Tart cherry juice or a tart cherry supplement can improve sleep and soreness post-workout     4. Myalgia  Will schedule for trigger point injections   5. Diabetic neuropathy             See #1, #3             Side effects with elavil to 50mg  qhs, continue 25mg  educated on signs/symptoms of seratonin syndrome previously  Provided dietary education  Refilled Cymbalta.   Recommended no snacking after dinner -Discussed current symptoms of pain and history of pain.  -Discussed benefits of exercise in reducing pain. -Discussed following foods that may reduce pain: 1) Ginger (especially studied for arthritis)- reduce leukotriene production to decrease inflammation 2) Blueberries- high in phytonutrients that decrease inflammation 3) Salmon- marine omega-3s reduce joint swelling and pain 4) Pumpkin seeds- reduce inflammation 5) dark chocolate- reduces inflammation 6) turmeric- reduces inflammation 7) tart cherries - reduce pain and stiffness 8) extra virgin olive oil - its compound olecanthal helps to block prostaglandins  9) chili peppers- can be eaten or applied topically via capsaicin 10) mint- helpful for headache, muscle aches, joint pain, and itching 11) garlic- reduces inflammation  Link to further information on diet  for chronic pain: http://www.randall.com/   --Discussed Qutenza as an option for neuropathic pain control. Discussed that this is a capsaicin patch, stronger than capsaicin cream. Discussed that it is currently approved for diabetic peripheral neuropathy and post-herpetic neuralgia, but that it has also shown benefit in treating other forms of neuropathy. Provided patient with link to site to learn more about the patch: [employer]fr. Discussed that the patch would be placed in office and benefits usually last 3 months. Discussed that unintended exposure to capsaicin can cause severe irritation of eyes, mucous membranes, respiratory tract, and skin, but that Qutenza is a local treatment and does not have the systemic side effects of other nerve medications. Discussed that there may be pain, itching, erythema, and decreased sensory function associated with the application of Qutenza. Side effects usually subside within 1 week. A cold pack of analgesic medications can help with these side effects. Blood pressure can also be increased due to pain associated with administration of the patch.    6. Morbid obesity             Not interested in seeing dietitian at this time  Encouraged activity   No attempts at weight loss  Discussed Topamax.   Commended on improvement!   7. Gait abnormality             Completed therapies, cont HEP  Continue cane for safety   8. Nonhealing ulcer             Left lateral foot, improving  9. Lightheadedness  Encouraged BP monitoring at home  10. Knee pain: -recommended applying voltaren gel  11. Chronic gout: continue allopurinol, recommended to avoid fructose  40 minutes spent in discussion of risks and  benefits of Qutenza and obtaining informed consent, discussion of q90 day follow-up and expectation of improvement in pain with each repeat application

## 2023-01-18 NOTE — Patient Instructions (Signed)
Voltaren gel 

## 2023-01-18 NOTE — Telephone Encounter (Signed)
Contacted Gilman Schmidt to schedule their annual wellness visit. Appointment made for 01/26/23.  [facility] [employer] AW[facility] direct phone # [phone]

## 2023-01-26 ENCOUNTER — Ambulatory Visit (INDEPENDENT_AMBULATORY_CARE_PROVIDER_SITE_OTHER): Payer: Medicare Other

## 2023-01-26 VITALS — BP 138/78 | HR 75 | Temp 97.5°F | Ht 72.5 in | Wt 260.4 lb

## 2023-01-26 DIAGNOSIS — Z0000 Encounter for general adult medical examination without abnormal findings: Secondary | ICD-10-CM

## 2023-01-26 NOTE — Patient Instructions (Signed)
[name] , Thank you for taking time to come for your Medicare Wellness Visit. I appreciate your ongoing commitment to your health goals. Please review the following plan we discussed and let me know if I can assist you in the future.   These are the goals we discussed:  Goals      Increase physical activity     Patient Stated     Would like to lose some weight     Patient Stated     01/26/2023, get to the gym by July        This is a list of the screening recommended for you and due dates:  Health Maintenance  Topic Date Due   Zoster (Shingles) Vaccine (2 of 2) 06/06/2023*   Flu Shot  05/24/2023   Hemoglobin A1C  06/15/2023   Complete foot exam   08/29/2023   Yearly kidney function blood test for diabetes  12/16/2023   Yearly kidney health urinalysis for diabetes  12/16/2023   Eye exam for diabetics  12/22/2023   Medicare Annual Wellness Visit  01/26/2024   DTaP/Tdap/Td vaccine (8 - Td or Tdap) 10/31/2031   Pneumonia Vaccine  Completed   HPV Vaccine  Aged Out   Colon Cancer Screening  Discontinued   COVID-19 Vaccine  Discontinued  *Topic was postponed. The date shown is not the original due date.    Advanced directives: Advance directive discussed with you today. Even though you declined this today please call our office should you change your mind and we can give you the proper paperwork for you to fill out.  Conditions/risks identified: none  Next appointment: Follow up in one year for your annual wellness visit.   Preventive Care 16 Years and Older, Male  Preventive care refers to lifestyle choices and visits with your health care provider that can promote health and wellness. What does preventive care include? A yearly physical exam. This is also called an annual well check. Dental exams once or twice a year. Routine eye exams. Ask your health care provider how often you should have your eyes checked. Personal lifestyle choices, including: Daily care of your teeth  and gums. Regular physical activity. Eating a healthy diet. Avoiding tobacco and drug use. Limiting alcohol use. Practicing safe sex. Taking low doses of aspirin every day. Taking vitamin and mineral supplements as recommended by your health care provider. What happens during an annual well check? The services and screenings done by your health care provider during your annual well check will depend on your age, overall health, lifestyle risk factors, and family history of disease. Counseling  Your health care provider may ask you questions about your: Alcohol use. Tobacco use. Drug use. Emotional well-being. Home and relationship well-being. Sexual activity. Eating habits. History of falls. Memory and ability to understand (cognition). Work and work Astronomer. Screening  You may have the following tests or measurements: Height, weight, and BMI. Blood pressure. Lipid and cholesterol levels. These may be checked every 5 years, or more frequently if you are over 56 years old. Skin check. Lung cancer screening. You may have this screening every year starting at age 50 if you have a 30-pack-year history of smoking and currently smoke or have quit within the past 15 years. Fecal occult blood test (FOBT) of the stool. You may have this test every year starting at age 50. Flexible sigmoidoscopy or colonoscopy. You may have a sigmoidoscopy every 5 years or a colonoscopy every 10 years starting at age  50. Prostate cancer screening. Recommendations will vary depending on your family history and other risks. Hepatitis C blood test. Hepatitis B blood test. Sexually transmitted disease (STD) testing. Diabetes screening. This is done by checking your blood sugar (glucose) after you have not eaten for a while (fasting). You may have this done every 1-3 years. Abdominal aortic aneurysm (AAA) screening. You may need this if you are a current or former smoker. Osteoporosis. You may be screened  starting at age 14 if you are at high risk. Talk with your health care provider about your test results, treatment options, and if necessary, the need for more tests. Vaccines  Your health care provider may recommend certain vaccines, such as: Influenza vaccine. This is recommended every year. Tetanus, diphtheria, and acellular pertussis (Tdap, Td) vaccine. You may need a Td booster every 10 years. Zoster vaccine. You may need this after age 20. Pneumococcal 13-valent conjugate (PCV13) vaccine. One dose is recommended after age 52. Pneumococcal polysaccharide (PPSV23) vaccine. One dose is recommended after age 37. Talk to your health care provider about which screenings and vaccines you need and how often you need them. This information is not intended to replace advice given to you by your health care provider. Make sure you discuss any questions you have with your health care provider. Document Released: 11/05/2015 Document Revised: 06/28/2016 Document Reviewed: 08/10/2015 Elsevier Interactive Patient Education  2017 [place] Prevention in the Home Falls can cause injuries. They can happen to people of all ages. There are many things you can do to make your home safe and to help prevent falls. What can I do on the outside of my home? Regularly fix the edges of walkways and driveways and fix any cracks. Remove anything that might make you trip as you walk through a door, such as a raised step or threshold. Trim any bushes or trees on the path to your home. Use bright outdoor lighting. Clear any walking paths of anything that might make someone trip, such as rocks or tools. Regularly check to see if handrails are loose or broken. Make sure that both sides of any steps have handrails. Any raised decks and porches should have guardrails on the edges. Have any leaves, snow, or ice cleared regularly. Use sand or salt on walking paths during winter. Clean up any spills in your garage  right away. This includes oil or grease spills. What can I do in the bathroom? Use night lights. Install grab bars by the toilet and in the tub and shower. Do not use towel bars as grab bars. Use non-skid mats or decals in the tub or shower. If you need to sit down in the shower, use a plastic, non-slip stool. Keep the floor dry. Clean up any water that spills on the floor as soon as it happens. Remove soap buildup in the tub or shower regularly. Attach bath mats securely with double-sided non-slip rug tape. Do not have throw rugs and other things on the floor that can make you trip. What can I do in the bedroom? Use night lights. Make sure that you have a light by your bed that is easy to reach. Do not use any sheets or blankets that are too big for your bed. They should not hang down onto the floor. Have a firm chair that has side arms. You can use this for support while you get dressed. Do not have throw rugs and other things on the floor that can make you  trip. What can I do in the kitchen? Clean up any spills right away. Avoid walking on wet floors. Keep items that you use a lot in easy-to-reach places. If you need to reach something above you, use a strong step stool that has a grab bar. Keep electrical cords out of the way. Do not use floor polish or wax that makes floors slippery. If you must use wax, use non-skid floor wax. Do not have throw rugs and other things on the floor that can make you trip. What can I do with my stairs? Do not leave any items on the stairs. Make sure that there are handrails on both sides of the stairs and use them. Fix handrails that are broken or loose. Make sure that handrails are as long as the stairways. Check any carpeting to make sure that it is firmly attached to the stairs. Fix any carpet that is loose or worn. Avoid having throw rugs at the top or bottom of the stairs. If you do have throw rugs, attach them to the floor with carpet tape. Make  sure that you have a light switch at the top of the stairs and the bottom of the stairs. If you do not have them, ask someone to add them for you. What else can I do to help prevent falls? Wear shoes that: Do not have high heels. Have rubber bottoms. Are comfortable and fit you well. Are closed at the toe. Do not wear sandals. If you use a stepladder: Make sure that it is fully opened. Do not climb a closed stepladder. Make sure that both sides of the stepladder are locked into place. Ask someone to hold it for you, if possible. Clearly mark and make sure that you can see: Any grab bars or handrails. First and last steps. Where the edge of each step is. Use tools that help you move around (mobility aids) if they are needed. These include: Canes. Walkers. Scooters. Crutches. Turn on the lights when you go into a dark area. Replace any light bulbs as soon as they burn out. Set up your furniture so you have a clear path. Avoid moving your furniture around. If any of your floors are uneven, fix them. If there are any pets around you, be aware of where they are. Review your medicines with your doctor. Some medicines can make you feel dizzy. This can increase your chance of falling. Ask your doctor what other things that you can do to help prevent falls. This information is not intended to replace advice given to you by your health care provider. Make sure you discuss any questions you have with your health care provider. Document Released: 08/05/2009 Document Revised: 03/16/2016 Document Reviewed: 11/13/2014 [employer] Interactive Patient Education  2017 [employer]

## 2023-01-26 NOTE — Progress Notes (Signed)
Subjective:   [name] is a 82 y.o. male who presents for Medicare Annual/Subsequent preventive examination.  Review of Systems     Cardiac Risk Factors include: advanced age (>3355men, 63>65 women);diabetes mellitus;dyslipidemia;hypertension;male gender;obesity (BMI >30kg/m2)     Objective:    Today's Vitals   01/26/23 1404  BP: 138/78  Pulse: 75  Temp: (!) 97.5 F (36.4 C)  TempSrc: Oral  SpO2: 97%  Weight: 260 lb 6.4 oz (118.1 kg)  Height: 6' 0.5" (1.842 m)   Body mass index is 34.83 kg/m.     01/26/2023    2:15 PM 01/10/2022    1:42 PM 04/23/2021   11:00 PM 04/23/2021    6:35 PM 01/04/2021    1:21 PM 12/31/2019   11:03 AM 05/05/2019    6:21 PM  Advanced Directives  Does Patient Have a Medical Advance Directive? No Yes No No Yes No No  Type of Special educational needs teacherAdvance Directive  Healthcare Power of [place];Living will   Living will;Healthcare Power of Attorney    Does patient want to make changes to medical advance directive?   No - Patient declined      Copy of Healthcare Power of Attorney in Chart?  No - copy requested   No - copy requested    Would patient like information on creating a medical advance directive?   No - Patient declined   No - Patient declined No - Patient declined    Current Medications (verified) Outpatient Encounter Medications as of 01/26/2023  Medication Sig   Accu-Chek FastClix Lancets MISC USE TO CHECK BLOOD SUGAR UP TO 6 TIMES DAILY   alfuzosin (UROXATRAL) 10 MG 24 hr tablet TAKE 1 TABLET BY MOUTH  DAILY WITH BREAKFAST   amitriptyline (ELAVIL) 50 MG tablet TAKE ONE-HALF TABLET BY  MOUTH AT BEDTIME   amLODipine (NORVASC) 2.5 MG tablet Take 1 tablet (2.5 mg total) by mouth daily.   Blood Glucose Monitoring Suppl (ACCU-CHEK AVIVA PLUS) w/Device KIT Use as directed   cetirizine (ZYRTEC) 10 MG tablet Take 10 mg by mouth daily as needed for allergies.   Continuous Blood Gluc Sensor (DEXCOM G6 SENSOR) MISC Inject 1 Device into the skin as directed. Apply to skin SQ  every 10 days   diclofenac Sodium (VOLTAREN) 1 % GEL Apply 2 g topically 4 (four) times daily.   DULoxetine (CYMBALTA) 60 MG capsule Take 1 capsule (60 mg total) by mouth daily.   ezetimibe (ZETIA) 10 MG tablet Take 1 tablet (10 mg total) by mouth daily.   fluticasone (FLONASE) 50 MCG/ACT nasal spray Place 1 spray into the nose daily as needed for allergies.    folic acid (FOLVITE) 1 MG tablet Take 1 mg by mouth daily.   furosemide (LASIX) 20 MG tablet TAKE 1 TABLET BY MOUTH  DAILY   glucose blood (ACCU-CHEK AVIVA PLUS) test strip USE TO TEST BLOOD SUGAR 6  TIMES PER DAY; E11.9   Insulin Infusion Pump Supplies (AUTOSOFT XC INFUSION SET) MISC Inject 1 Act into the skin every other day. Use with 9mm cannula and 23 inch tubing. *5 boxes (90 day supply)   Insulin Infusion Pump Supplies (T:SLIM INSULIN CARTRIDGE 3ML) MISC Use with insulin pump, fill once every 2 days.   insulin lispro (HUMALOG) 100 UNIT/ML injection INJECT SUBCUTANEOUSLY VIA  INSULIN PUMP TOTAL OF 120  UNITS PER DAY   losartan (COZAAR) 25 MG tablet Take 12.5 mg by mouth daily.   Multiple Vitamins-Minerals (CENTRUM SILVER PO) Take 1 tablet by mouth daily.  Omega-3 Fatty Acids (FISH OIL) 1200 MG CAPS Take 1,200 mg by mouth daily.   omeprazole (PRILOSEC) 20 MG capsule Take 1 capsule (20 mg total) by mouth daily as needed.   rosuvastatin (CRESTOR) 40 MG tablet TAKE 1 TABLET BY MOUTH AT  BEDTIME   tiZANidine (ZANAFLEX) 4 MG tablet Take 1 tablet (4 mg total) by mouth 2 (two) times daily as needed for muscle spasms.   topiramate (TOPAMAX) 25 MG tablet Take 1 tablet (25 mg total) by mouth at bedtime.   vitamin B-12 (CYANOCOBALAMIN) 1000 MCG tablet Take 1,000 mcg by mouth daily.   No facility-administered encounter medications on file as of 01/26/2023.    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   Gynecomastia    Hepatic steatosis    HYPERLIPIDEMIA 05/20/2007   Hypertension    Hypogonadism male    Morbid obesity 09/10/2009   OBSTRUCTIVE SLEEP APNEA 11/04/2007   Personal history of colonic polyps    RENAL INSUFFICIENCY 05/12/2008   Sleep apnea    uses cpap   Squamous cell carcinoma of skin 02/16/2020   left lower leg, anterior-cx3,25fu   Urolithiasis    Past Surgical History:  Procedure Laterality Date   Abdominal US  09/1997   arm fracture Left 1958   with hardware   Colon cancer screening     COLONOSCOPY  08/18/2004   diverticulitis   COLONOSCOPY  09/24/2009   ELECTROCARDIOGRAM  02/2006   FLEXIBLE SIGMOIDOSCOPY  03/04/2001   polyps, anal fissure   GANGLION CYST EXCISION Left 1980   HIP PINNING,CANNULATED Right 04/25/2021   Procedure: CANNULATED HIP PINNING;  Surgeon: Samson Frederic, MD;  Location: [facility];  Service: Orthopedics;  Laterality: Right;   Lower Arterial  04/13/2004   POLYPECTOMY     Rest Cardiolite  03/19/2003   Family History  Problem Relation Age of Onset   Colon cancer Brother 10   Colon polyps Brother    Lung cancer Brother    Other Mother        MVA, deceased 36s   Healthy Daughter    Healthy Son    Rectal cancer Neg Hx    Stomach cancer Neg Hx    Social History   Socioeconomic History   Marital status: Married    Spouse name: Not on file   Number of children: Not on file   Years of education: Not on file   Highest education level: Not on file  Occupational History    Employer: [employer]  Tobacco Use   Smoking status: Former    Packs/day: 2.00    Years: 31.00    Additional pack years:  0.00    Total pack years: 62.00    Types: Cigarettes    Start date: 19    Quit date: 10/23/1982    Years since quitting: 40.2   Smokeless tobacco: Never  Vaping Use   Vaping Use: Never used  Substance and Sexual Activity   Alcohol use:  No    Alcohol/week: 0.0 standard drinks of alcohol   Drug use: No   Sexual activity: Not on file  Other Topics Concern   Not on file  Social History Narrative   Lives with wife in a one story home.  Has a son and a daughter.     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     Education: 2 years of college.   Social Determinants of Health   Financial Resource Strain: Low Risk  (01/26/2023)   Overall Financial Resource Strain (CARDIA)    Difficulty of Paying Living Expenses: Not hard at all  Food Insecurity: No Food Insecurity (01/26/2023)   Hunger Vital Sign    Worried About Running Out of Food in the Last Year: Never true    Ran Out of Food in the Last Year: Never true  Transportation Needs: No Transportation Needs (01/26/2023)   PRAPARE - Administrator, Civil Service (Medical): No    Lack of Transportation (Non-Medical): No  Physical Activity: Inactive (01/26/2023)   Exercise Vital Sign    Days of Exercise per Week: 0 days    Minutes of Exercise per Session: 0 min  Stress: No Stress Concern Present (01/26/2023)   [employer] of Occupational Health - Occupational Stress Questionnaire    Feeling of Stress : Not at all  Social Connections: Socially Integrated (01/10/2022)   Social Connection and Isolation Panel [NHANES]    Frequency of Communication with Friends and Family: Twice a week    Frequency of Social Gatherings with Friends and Family: Twice a week    Attends Religious Services: 1 to 4 times per year    Active Member of [employer] or Organizations: Yes    Attends Banker Meetings: 1 to 4 times per year    Marital Status: Married    Tobacco Counseling Counseling given: Not Answered   Clinical  Intake:  Pre-visit preparation completed: Yes  Pain : No/denies pain     Nutritional Status: BMI > 30  Obese Nutritional Risks: None Diabetes: Yes  How often do you need to have someone help you when you read instructions, pamphlets, or other written materials from your doctor or pharmacy?: 1 - Never  Diabetic? Yes Nutrition Risk Assessment:  Has the patient had any N/V/D within the last 2 months?  No  Does the patient have any non-healing wounds?  No  Has the patient had any unintentional weight loss or weight gain?  No   Diabetes:  Is the patient diabetic?  Yes  If diabetic, was a CBG obtained today?  No  Did the patient bring in their glucometer from home?  No  How often do you monitor your CBG's? frequently.   Financial Strains and Diabetes Management:  Are you having any financial strains with the device, your supplies or your medication? No .  Does the patient want to be seen by Chronic Care Management for management of their diabetes?  No  Would the patient like to be referred to a Nutritionist or for Diabetic Management?  No   Diabetic Exams:  Diabetic Eye Exam: Completed 12/22/2022 Diabetic Foot Exam: Completed 08/28/2022   Interpreter Needed?: No  Information entered by :: NAllen LPN   Activities of Daily Living    01/26/2023    2:18 PM  In your present state of health, do you have any difficulty performing the following activities:  Hearing? 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 0  Walking or climbing stairs? 0  Dressing or bathing? 0  Doing errands, shopping? 0  Preparing Food and eating ? N  Using the Toilet? N  In the past six months, have you accidently leaked urine? N  Do you have problems with loss of bowel control? N  Managing your Medications? N  Managing your Finances? N  Housekeeping or managing your Housekeeping? N    Patient Care Team: Mliss Sax, MD as PCP - General (Family Medicine) Christell Constant, MD as PCP - Cardiology (Cardiology) Glendale Chard, DO as Consulting Physician (Neurology) Clance, Maree Krabbe, MD as Consulting Physician (Pulmonary Disease) Rodrigo Ran, OD (Ophthalmology) Glyn Ade, PA-C as Physician Assistant (Dermatology)  Indicate any recent Medical Services you may have received from other than [facility] providers in the past year (date may be approximate).     Assessment:   This is a routine wellness examination for [place].  Hearing/Vision screen Vision Screening - Comments:: Regular eye exams, Dr. Parke Simmers  Dietary issues and exercise activities discussed: Current Exercise Habits: The patient does not participate in regular exercise at present   Goals Addressed             This Visit's Progress    Patient Stated       01/26/2023, get to the gym by July       Depression Screen    01/26/2023    2:18 PM 01/18/2023   10:17 AM 11/21/2022   10:42 AM 09/19/2022   11:45 AM 05/24/2022   10:30 AM 02/16/2022    9:48 AM 01/10/2022    1:43 PM  PHQ 2/9 Scores  PHQ - 2 Score 0 0 0 0 0 0 0    Fall Risk    01/26/2023    2:18 PM 01/18/2023   10:17 AM 11/21/2022   10:42 AM 09/19/2022   11:45 AM 06/22/2022   10:08 AM  Fall Risk   Falls in the past year? 0 0 0  0  Number falls in past yr: 0 0 0 0   Injury with Fall? 0 0 0 0   Risk for fall due to : Medication side effect      Follow up Falls prevention discussed;Education provided;Falls evaluation completed        FALL RISK PREVENTION PERTAINING TO THE HOME:  Any stairs in or around the home? No  If so, are there any without handrails?  Has a rail Home free of loose throw rugs in walkways, pet beds, electrical cords, etc? Yes  Adequate lighting in your home to reduce risk of falls? Yes   ASSISTIVE DEVICES UTILIZED TO PREVENT FALLS:  Life alert? No  Use of a cane, walker or w/c? No  Grab bars in the bathroom? Yes  Shower chair or  bench in shower? Yes  Elevated toilet seat or a handicapped toilet? Yes   TIMED UP AND GO:  Was the test performed? Yes .  Length of time to ambulate 10 feet: 5 sec.   Gait steady and fast without use of assistive device  Cognitive Function:        01/26/2023    2:19 PM 01/04/2021    1:36 PM  6CIT Screen  What Year? 0 points 0 points  What month? 0 points 0 points  What time? 0 points 0 points  Count back from 20 0 points 0 points  Months in reverse 0  points 2 points  Repeat phrase 8 points 0 points  Total Score 8 points 2 points    Immunizations Immunization History  Administered Date(s) Administered   Fluad Quad(high Dose 65+) 07/10/2019   Influenza Split 07/23/2009, 06/23/2010   Influenza Whole 07/23/2009, 06/23/2010   Influenza, High Dose Seasonal PF 07/03/2017, 07/30/2018   Influenza,inj,Quad PF,6+ Mos 09/26/2013, 07/07/2014, 10/01/2015, 06/30/2016   Influenza-Unspecified 07/07/2021, 07/31/2022   PFIZER(Purple Top)SARS-COV-2 Vaccination 11/04/2019, 11/24/2019, 07/25/2020   Pneumococcal Conjugate-13 11/25/2014   Pneumococcal Polysaccharide-23 02/21/1995, 03/20/2013   Td 02/20/1998, 10/27/2008   Td (Adult), 2 Lf Tetanus Toxid, Preservative Free 02/20/1998, 10/27/2008   Tdap 02/20/1998, 10/27/2008, 10/30/2021   Zoster Recombinat (Shingrix) 08/11/2021   Zoster, Live 07/07/2014    TDAP status: Up to date  Flu Vaccine status: Up to date  Pneumococcal vaccine status: Up to date  Covid-19 vaccine status: Completed vaccines  Qualifies for Shingles Vaccine? Yes   Zostavax completed Yes   Shingrix Completed?: Yes  Screening Tests Health Maintenance  Topic Date Due   Medicare Annual Wellness (AWV)  01/11/2023   Zoster Vaccines- Shingrix (2 of 2) 06/06/2023 (Originally 10/06/2021)   INFLUENZA VACCINE  05/24/2023   HEMOGLOBIN A1C  06/15/2023   FOOT EXAM  08/29/2023   Diabetic kidney evaluation - eGFR measurement  12/16/2023   Diabetic kidney evaluation - Urine  ACR  12/16/2023   OPHTHALMOLOGY EXAM  12/22/2023   DTaP/Tdap/Td (8 - Td or Tdap) 10/31/2031   Pneumonia Vaccine 1965+ Years old  Completed   HPV VACCINES  Aged Out   COLONOSCOPY (Pts 45-252yrs Insurance coverage will need to be confirmed)  Discontinued   COVID-19 Vaccine  Discontinued    Health Maintenance  Health Maintenance Due  Topic Date Due   Medicare Annual Wellness (AWV)  01/11/2023    Colorectal cancer screening: No longer required.   Lung Cancer Screening: (Low Dose CT Chest recommended if Age 21-80 years, 30 pack-year currently smoking OR have quit w/in 15years.) does not qualify.   Lung Cancer Screening Referral: no  Additional Screening:  Hepatitis C Screening: does not qualify;  Vision Screening: Recommended annual ophthalmology exams for early detection of glaucoma and other disorders of the eye. Is the patient up to date with their annual eye exam?  Yes  Who is the provider or what is the name of the office in which the patient attends annual eye exams? Dr. Parke SimmersMartinek If pt is not established with a provider, would they like to be referred to a provider to establish care? No .   Dental Screening: Recommended annual dental exams for proper oral hygiene  Community Resource Referral / Chronic Care Management: CRR required this visit?  No   CCM required this visit?  No      Plan:     I have personally reviewed and noted the following in the patient's chart:   Medical and social history Use of alcohol, tobacco or illicit drugs  Current medications and supplements including opioid prescriptions. Patient is not currently taking opioid prescriptions. Functional ability and status Nutritional status Physical activity Advanced directives List of other physicians Hospitalizations, surgeries, and ER visits in previous 12 months Vitals Screenings to include cognitive, depression, and falls Referrals and appointments  In addition, I have reviewed and discussed  with patient certain preventive protocols, quality metrics, and best practice recommendations. A written personalized care plan for preventive services as well as general preventive health recommendations were provided to patient.     Barb MerinoNickeah E Laney Louderback, LPN  01/26/2023   Nurse Notes: none

## 2023-02-09 ENCOUNTER — Telehealth: Payer: Self-pay

## 2023-02-09 DIAGNOSIS — E1142 Type 2 diabetes mellitus with diabetic polyneuropathy: Secondary | ICD-10-CM

## 2023-02-09 MED ORDER — QUTENZA (4 PATCH) 8 % EX KIT: 4.0000 | PACK | Freq: Once | CUTANEOUS | 0 refills | Status: DC

## 2023-02-09 NOTE — Telephone Encounter (Signed)
Qutenza reordered for patient

## 2023-02-20 ENCOUNTER — Telehealth: Payer: Self-pay

## 2023-02-20 NOTE — Telephone Encounter (Signed)
Inbound fax requesting forms be completed and faxed with recent clinical notes. Clinical notes routed via Epic to [place].

## 2023-03-08 ENCOUNTER — Telehealth: Payer: Self-pay | Admitting: Family Medicine

## 2023-03-08 NOTE — Telephone Encounter (Signed)
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 He is wanting a cb at [phone]

## 2023-03-09 ENCOUNTER — Encounter: Payer: Self-pay | Admitting: Family

## 2023-03-09 ENCOUNTER — Ambulatory Visit (INDEPENDENT_AMBULATORY_CARE_PROVIDER_SITE_OTHER)
Admission: RE | Admit: 2023-03-09 | Discharge: 2023-03-09 | Disposition: A | Discharge status: Home / Self Care | Payer: Medicare Other | Source: Ambulatory Visit | Attending: Family | Admitting: Family

## 2023-03-09 ENCOUNTER — Ambulatory Visit: Payer: Medicare Other | Admitting: Family

## 2023-03-09 VITALS — BP 102/64 | HR 97 | Temp 98.5°F | Resp 16 | Ht 72.5 in | Wt 261.0 lb

## 2023-03-09 DIAGNOSIS — D489 Neoplasm of uncertain behavior, unspecified: Secondary | ICD-10-CM

## 2023-03-09 DIAGNOSIS — M5416 Radiculopathy, lumbar region: Secondary | ICD-10-CM

## 2023-03-09 MED ORDER — TRAMADOL HCL 50 MG PO TABS: 50.0000 mg | ORAL_TABLET | Freq: Three times a day (TID) | ORAL | 0 refills | Status: AC | PRN

## 2023-03-09 MED ORDER — METHYLPREDNISOLONE 4 MG PO TBPK: ORAL_TABLET | ORAL | 0 refills | Status: DC

## 2023-03-09 NOTE — Telephone Encounter (Signed)
Appointment scheduled for evaluation.  

## 2023-03-09 NOTE — Progress Notes (Signed)
Acute Office Visit  Subjective:     Patient ID: [name], male    DOB: Sep 22, 1941, 82 y.o.   MRN: [id number]  Chief Complaint  Patient presents with  . Back Pain    With pain shooting down right leg     HPI Patient is in today with c/o a sharp shooting pain down the left leg with low back pain x 1 week but worsening the last 2 days. Pain is sharp and worse when standing up straight. Better when bending. Has taken one dose of Hydrocodone from an eye procedure and used a lidocaine patch that helped temporarily. Has a history of low back pain.   Patient is also requesting a referral to dermatology to have a lesion checked that has been on his right lower leg for year but recently started growing and changing colors.   Review of Systems  Constitutional: Negative.   Respiratory: Negative.    Cardiovascular: Negative.   Genitourinary:  Positive for hematuria.  Musculoskeletal:  Positive for back pain.       Radiates down the left left  Skin:        Lesion right lower leg  Neurological: Negative.   Psychiatric/Behavioral: Negative.     Past Medical History:  Diagnosis Date  . Allergy   . Anal fissure   . Anemia, unspecified   . Arthritis    Spinal OA  . BACK PAIN, LUMBAR 10/23/2007  . CARDIAC MURMUR, SYSTOLIC 10/27/2008  . DIABETES MELLITUS, TYPE I 05/20/2007  . DIASTOLIC DYSFUNCTION 11/04/2008  . 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HCC)   . Duodenitis   . Edema 05/12/2008  . Esophageal stricture   . GERD (gastroesophageal reflux disease)   . 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 Gynecomastia   . Hepatic steatosis   . HYPERLIPIDEMIA 05/20/2007  . Hypertension   . Hypogonadism male   . Morbid obesity (HCC) 09/10/2009  . OBSTRUCTIVE SLEEP APNEA 11/04/2007  . Personal history of colonic polyps   . RENAL INSUFFICIENCY 05/12/2008  . Sleep apnea    uses cpap  . Squamous cell carcinoma of skin 02/16/2020   left lower leg, anterior-cx3,70fu  . Urolithiasis     Social History    Socioeconomic History  . Marital status: Married    Spouse name: Not on file  . Number of children: Not on file  . Years of education: Not on file  . Highest education level: Not on file  Occupational History    Employer: [employer]  Tobacco Use  . Smoking status: Former    Packs/day: 2.00    Years: 31.00    Additional pack years: 0.00    Total pack years: 62.00    Types: Cigarettes    Start date: 30    Quit date: 10/23/1982    Years since quitting: 40.4  . Smokeless tobacco: Never  Vaping Use  . Vaping Use: Never used  Substance and Sexual Activity  . Alcohol use: No    Alcohol/week: 0.0 standard drinks of alcohol  . Drug use: No  . Sexual activity: Not on file  Other Topics Concern  . Not on file  Social History Narrative   Lives with wife in a one story home.  Has a son and a daughter.     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     Education: 2 years of college.   Social Determinants of Health   Financial Resource Strain: Low Risk  (01/26/2023)  Overall Physicist, medical Strain (CARDIA)   . Difficulty of Paying Living Expenses: Not hard at all  Food Insecurity: No Food Insecurity (01/26/2023)   Hunger Vital Sign   . Worried About Programme researcher, broadcasting/film/video in the Last Year: Never true   . Ran Out of Food in the Last Year: Never true  Transportation Needs: No Transportation Needs (01/26/2023)   PRAPARE - Transportation   . Lack of Transportation (Medical): No   . Lack of Transportation (Non-Medical): No  Physical Activity: Inactive (01/26/2023)   Exercise Vital Sign   . Days of Exercise per Week: 0 days   . Minutes of Exercise per Session: 0 min  Stress: No Stress Concern Present (01/26/2023)   [employer] of Occupational Health - Occupational Stress Questionnaire   . Feeling of Stress : Not at all  Social Connections: Socially Integrated (01/10/2022)   Social Connection and Isolation Panel [NHANES]   . Frequency of Communication with Friends  and Family: Twice a week   . Frequency of Social Gatherings with Friends and Family: Twice a week   . Attends Religious Services: 1 to 4 times per year   . Active Member of Clubs or Organizations: Yes   . Attends Banker Meetings: 1 to 4 times per year   . Marital Status: Married  Catering manager Violence: Not At Risk (01/10/2022)   Humiliation, Afraid, Rape, and Kick questionnaire   . Fear of Current or Ex-Partner: No   . Emotionally Abused: No   . Physically Abused: No   . Sexually Abused: No    Past Surgical History:  Procedure Laterality Date  . Abdominal US  09/1997  . arm fracture Left 1958   with hardware  . Colon cancer screening    . COLONOSCOPY  08/18/2004   diverticulitis  . COLONOSCOPY  09/24/2009  . ELECTROCARDIOGRAM  02/2006  . FLEXIBLE SIGMOIDOSCOPY  03/04/2001   polyps, anal fissure  . GANGLION CYST EXCISION Left 1980  . HIP PINNING,CANNULATED Right 04/25/2021   Procedure: CANNULATED HIP PINNING;  Surgeon: Samson Frederic, MD;  Location: [facility];  Service: Orthopedics;  Laterality: Right;  . Lower Arterial  04/13/2004  . POLYPECTOMY    . Rest Cardiolite  03/19/2003    Family History  Problem Relation Age of Onset  . Colon cancer Brother 69  . Colon polyps Brother   . Lung cancer Brother   . Other Mother        MVA, deceased 51s  . Healthy Daughter   . Healthy Son   . Rectal cancer Neg Hx   . Stomach cancer Neg Hx     Allergies  Allergen Reactions  . Atorvastatin Calcium   . Lipitor [Atorvastatin] Other (See Comments)    unknown  . Niacin     REACTION: Severe heartburn  . Pioglitazone     REACTION: Edema    Current Outpatient Medications on File Prior to Visit  Medication Sig Dispense Refill  . Accu-Chek FastClix Lancets MISC USE TO CHECK BLOOD SUGAR UP TO 6 TIMES DAILY 612 each 3  . alfuzosin (UROXATRAL) 10 MG 24 hr tablet TAKE 1 TABLET BY MOUTH  DAILY WITH BREAKFAST 90 tablet 3  . amitriptyline (ELAVIL) 50 MG tablet TAKE ONE-HALF  TABLET BY  MOUTH AT BEDTIME 45 tablet 3  . amLODipine (NORVASC) 2.5 MG tablet Take 1 tablet (2.5 mg total) by mouth daily. 90 tablet 3  . Blood Glucose Monitoring Suppl (ACCU-CHEK AVIVA PLUS) w/Device KIT Use as directed  1 kit 0  . [START ON 04/19/2023] capsaicin topical system (QUTENZA, 4 PATCH,) 8 % Apply 4 patches topically once for 1 dose. Applied to affected area every 3 months as needed by provider for neuropathy 4 patch 0  . cetirizine (ZYRTEC) 10 MG tablet Take 10 mg by mouth daily as needed for allergies.    . Continuous Blood Gluc Sensor (DEXCOM G6 SENSOR) MISC Inject 1 Device into the skin as directed. Apply to skin SQ every 10 days 9 each 3  . diclofenac Sodium (VOLTAREN) 1 % GEL Apply 2 g topically 4 (four) times daily. 150 g 3  . DULoxetine (CYMBALTA) 60 MG capsule Take 1 capsule (60 mg total) by mouth daily. 90 capsule 3  . ezetimibe (ZETIA) 10 MG tablet Take 1 tablet (10 mg total) by mouth daily. 90 tablet 3  . fluticasone (FLONASE) 50 MCG/ACT nasal spray Place 1 spray into the nose daily as needed for allergies.     . folic acid (FOLVITE) 1 MG tablet Take 1 mg by mouth daily.    . furosemide (LASIX) 20 MG tablet TAKE 1 TABLET BY MOUTH  DAILY 90 tablet 3  . glucose blood (ACCU-CHEK AVIVA PLUS) test strip USE TO TEST BLOOD SUGAR 6  TIMES PER DAY; E11.9 600 each 11  . Insulin Infusion Pump Supplies (AUTOSOFT XC INFUSION SET) MISC Inject 1 Act into the skin every other day. Use with 9mm cannula and 23 inch tubing. *5 boxes (90 day supply) 5 each 3  . Insulin Infusion Pump Supplies (T:SLIM INSULIN CARTRIDGE ) MISC Use with insulin pump, fill once every 2 days. 5 each 3  . insulin lispro (HUMALOG) 100 UNIT/ML injection INJECT SUBCUTANEOUSLY VIA  INSULIN PUMP TOTAL OF 120  UNITS PER DAY 110 mL 3  . losartan (COZAAR) 25 MG tablet Take 12.5 mg by mouth daily.    . Multiple Vitamins-Minerals (CENTRUM SILVER PO) Take 1 tablet by mouth daily.    . Omega-3 Fatty Acids (FISH OIL) 1200 MG CAPS  Take 1,200 mg by mouth daily.    Marland Kitchen omeprazole (PRILOSEC) 20 MG capsule Take 1 capsule (20 mg total) by mouth daily as needed. 90 capsule 2  . rosuvastatin (CRESTOR) 40 MG tablet TAKE 1 TABLET BY MOUTH AT  BEDTIME 90 tablet 3  . tiZANidine (ZANAFLEX) 4 MG tablet Take 1 tablet (4 mg total) by mouth 2 (two) times daily as needed for muscle spasms. 30 tablet 0  . topiramate (TOPAMAX) 25 MG tablet Take 1 tablet (25 mg total) by mouth at bedtime. 90 tablet 3  . vitamin B-12 (CYANOCOBALAMIN) 1000 MCG tablet Take 1,000 mcg by mouth daily.     No current facility-administered medications on file prior to visit.    BP 102/64 (BP Location: Right Arm, Patient Position: Sitting, Cuff Size: Large)   Pulse 97   Temp 98.5 F (36.9 C) (Temporal)   Resp 16   Ht 6' 0.5" (1.842 m)   Wt 261 lb (118.4 kg)   SpO2 100%   BMI 34.91 kg/m chart     Objective:    BP 102/64 (BP Location: Right Arm, Patient Position: Sitting, Cuff Size: Large)   Pulse 97   Temp 98.5 F (36.9 C) (Temporal)   Resp 16   Ht 6' 0.5" (1.842 m)   Wt 261 lb (118.4 kg)   SpO2 100%   BMI 34.91 kg/m    Physical Exam Vitals reviewed.  Constitutional:      Appearance: He is obese.  Cardiovascular:     Rate and Rhythm: Normal rate and regular rhythm.     Pulses: Normal pulses.     Heart sounds: Normal heart sounds.  Pulmonary:     Effort: Pulmonary effort is normal.     Breath sounds: Normal breath sounds.  Abdominal:     General: Bowel sounds are normal.     Palpations: Abdomen is soft.  Musculoskeletal:     Cervical back: Normal range of motion and neck supple.       Legs:     Comments: Pain elicited to the lower back when lying flat and standing straight. No pain with rotation of the torso or SLR  Skin:    General: Skin is warm and dry.     Findings: Lesion present.     Comments: 1.5cm skin lesion that is raised in some areas, blueish/gray, rough texture  Neurological:     General: No focal deficit present.      Mental Status: He is alert and oriented to person, place, and time. Mental status is at baseline.  Psychiatric:        Mood and Affect: Mood normal.        Behavior: Behavior normal.   No results found for any visits on 03/09/23.      Assessment & Plan:   Problem List Items Addressed This Visit   None Visit Diagnoses     Lumbar radiculopathy    -  Primary   Relevant Orders   DG Lumbar Spine Complete   Neoplasm, uncertain whether benign or malignant       Relevant Orders   Ambulatory referral to Dermatology       Meds ordered this encounter  Medications  . methylPREDNISolone (MEDROL DOSEPAK) 4 MG TBPK tablet    Sig: As directed    Dispense:  21 tablet    Refill:  0  . traMADol (ULTRAM) 50 MG tablet    Sig: Take 1 tablet (50 mg total) by mouth every 8 (eight) hours as needed for up to 5 days.    Dispense:  15 tablet    Refill:  0   Referral placed.  Will send to [facility] for an xray. Medrol DP sent and Tramadol to help with pain. Call the office if symptoms worsen or persist. Recheck as scheduled  No follow-ups on file.  Eulis Foster, FNP

## 2023-03-09 NOTE — Patient Instructions (Signed)
Please go to [address] to have your Xray done.

## 2023-03-13 ENCOUNTER — Ambulatory Visit: Payer: Medicare Other | Attending: Internal Medicine

## 2023-03-13 DIAGNOSIS — I700 Atherosclerosis of aorta: Secondary | ICD-10-CM

## 2023-03-13 DIAGNOSIS — I2510 Atherosclerotic heart disease of native coronary artery without angina pectoris: Secondary | ICD-10-CM

## 2023-03-13 DIAGNOSIS — E782 Mixed hyperlipidemia: Secondary | ICD-10-CM

## 2023-03-13 LAB — LIPID PANEL
Chol/HDL Ratio: 4.7 ratio (ref 0.0–5.0)
Cholesterol, Total: 203 mg/dL — ABNORMAL HIGH (ref 100–199)
HDL: 43 mg/dL (ref >39)
LDL Chol Calc (NIH): 127 mg/dL — ABNORMAL HIGH (ref 0–99)
Triglycerides: 188 mg/dL — ABNORMAL HIGH (ref 0–149)
VLDL Cholesterol Cal: 33 mg/dL (ref 5–40)

## 2023-03-18 ENCOUNTER — Other Ambulatory Visit: Payer: Self-pay | Admitting: Endocrinology

## 2023-03-18 DIAGNOSIS — E0800 Diabetes mellitus due to underlying condition with hyperosmolarity without nonketotic hyperglycemic-hyperosmolar coma (NKHHC): Secondary | ICD-10-CM

## 2023-03-20 ENCOUNTER — Telehealth: Payer: Self-pay

## 2023-03-20 DIAGNOSIS — I2510 Atherosclerotic heart disease of native coronary artery without angina pectoris: Secondary | ICD-10-CM

## 2023-03-20 DIAGNOSIS — I700 Atherosclerosis of aorta: Secondary | ICD-10-CM

## 2023-03-20 DIAGNOSIS — E782 Mixed hyperlipidemia: Secondary | ICD-10-CM

## 2023-03-20 NOTE — Telephone Encounter (Signed)
-----   Message from Christell Constant, MD sent at 03/18/2023  2:05 PM EDT ----- Results: LDL with no significant change Plan: If he is taking his medications; lipid clinic  Christell Constant, MD

## 2023-03-20 NOTE — Telephone Encounter (Signed)
The patient has been notified of the result and verbalized understanding.  All questions (if any) were answered. Arvid Right Hermenegildo Clausen, RN 03/20/2023 1:00 PM   Pt scheduled for lipid clinic on 04/11/23.

## 2023-03-21 ENCOUNTER — Ambulatory Visit: Payer: Medicare Other | Admitting: Family Medicine

## 2023-03-21 ENCOUNTER — Other Ambulatory Visit (HOSPITAL_COMMUNITY): Payer: Self-pay

## 2023-03-21 ENCOUNTER — Encounter: Payer: Self-pay | Admitting: Family Medicine

## 2023-03-21 VITALS — BP 115/64 | HR 89 | Temp 98.0°F | Ht 72.0 in | Wt 236.2 lb

## 2023-03-21 DIAGNOSIS — L989 Disorder of the skin and subcutaneous tissue, unspecified: Secondary | ICD-10-CM

## 2023-03-21 DIAGNOSIS — E538 Deficiency of other specified B group vitamins: Secondary | ICD-10-CM

## 2023-03-21 MED ORDER — QUTENZA (4 PATCH) 8 % EX KIT
4.0000 | PACK | Freq: Once | CUTANEOUS | 0 refills | Status: DC
  Filled 2023-03-21: qty 4, 84d supply, fill #0

## 2023-03-21 NOTE — Addendum Note (Signed)
Addended by: Sydnee Cabal D on: 03/21/2023 12:13 PM   Modules accepted: Orders

## 2023-03-21 NOTE — Progress Notes (Signed)
Established Patient Office Visit   Subjective:  Patient ID: [name], male    DOB: Sep 08, 1941  Age: 82 y.o. MRN: [id number]  Chief Complaint  Patient presents with   Medical Management of Chronic Issues    Routine follow patient would like sore on right leg checked. Patient fasting.     HPI Encounter Diagnoses  Name Primary?   Skin lesion of right leg Yes   B12 deficiency    Presents for evaluation of a skin lesion on right shin that has been present over the last 6 weeks.  There is an associated area of darkened pigmentation that is new as well.  Lesion is asymptomatic.  It does not bleed.  Reports compliance with B12.  Using omeprazole on a as needed basis only, perhaps only once a month.   Review of Systems  Constitutional: Negative.   HENT: Negative.    Eyes:  Negative for blurred vision, discharge and redness.  Respiratory: Negative.    Cardiovascular: Negative.   Gastrointestinal:  Negative for abdominal pain.  Genitourinary: Negative.   Musculoskeletal: Negative.  Negative for myalgias.  Skin:  Negative for rash.  Neurological:  Negative for tingling, loss of consciousness and weakness.  Endo/Heme/Allergies:  Negative for polydipsia.     Current Outpatient Medications:    Accu-Chek FastClix Lancets MISC, USE TO CHECK BLOOD SUGAR UP TO 6 TIMES DAILY, Disp: 612 each, Rfl: 3   alfuzosin (UROXATRAL) 10 MG 24 hr tablet, TAKE 1 TABLET BY MOUTH  DAILY WITH BREAKFAST, Disp: 90 tablet, Rfl: 3   amitriptyline (ELAVIL) 50 MG tablet, TAKE ONE-HALF TABLET BY  MOUTH AT BEDTIME, Disp: 45 tablet, Rfl: 3   amLODipine (NORVASC) 2.5 MG tablet, Take 1 tablet (2.5 mg total) by mouth daily., Disp: 90 tablet, Rfl: 3   Blood Glucose Monitoring Suppl (ACCU-CHEK AVIVA PLUS) w/Device KIT, Use as directed, Disp: 1 kit, Rfl: 0   [START ON 04/19/2023] capsaicin topical system (QUTENZA, 4 PATCH,) 8 %, Apply 4 patches topically once for 1 dose. Applied to affected area every 3 months as needed by  provider for neuropathy, Disp: 4 patch, Rfl: 0   cetirizine (ZYRTEC) 10 MG tablet, Take 10 mg by mouth daily as needed for allergies., Disp: , Rfl:    Continuous Blood Gluc Sensor (DEXCOM G6 SENSOR) MISC, Inject 1 Device into the skin as directed. Apply to skin SQ every 10 days, Disp: 9 each, Rfl: 3   diclofenac Sodium (VOLTAREN) 1 % GEL, Apply 2 g topically 4 (four) times daily., Disp: 150 g, Rfl: 3   DULoxetine (CYMBALTA) 60 MG capsule, Take 1 capsule (60 mg total) by mouth daily., Disp: 90 capsule, Rfl: 3   ezetimibe (ZETIA) 10 MG tablet, Take 1 tablet (10 mg total) by mouth daily., Disp: 90 tablet, Rfl: 3   fluticasone (FLONASE) 50 MCG/ACT nasal spray, Place 1 spray into the nose daily as needed for allergies. , Disp: , Rfl:    folic acid (FOLVITE) 1 MG tablet, Take 1 mg by mouth daily., Disp: , Rfl:    furosemide (LASIX) 20 MG tablet, TAKE 1 TABLET BY MOUTH  DAILY, Disp: 90 tablet, Rfl: 3   glucose blood (ACCU-CHEK AVIVA PLUS) test strip, USE TO TEST BLOOD SUGAR 6  TIMES PER DAY; E11.9, Disp: 600 each, Rfl: 11   Insulin Infusion Pump Supplies (AUTOSOFT XC INFUSION SET) MISC, Inject 1 Act into the skin every other day. Use with 9mm cannula and 23 inch tubing. *5 boxes (90 day supply), Disp:  5 each, Rfl: 3   Insulin Infusion Pump Supplies (T:SLIM INSULIN CARTRIDGE ) MISC, Use with insulin pump, fill once every 2 days., Disp: 5 each, Rfl: 3   insulin lispro (HUMALOG) 100 UNIT/ML injection, INJECT SUBCUTANEOUSLY VIA  INSULIN PUMP TOTAL OF 120 UNITS  DAILY, Disp: 110 mL, Rfl: 0   losartan (COZAAR) 25 MG tablet, Take 12.5 mg by mouth daily., Disp: , Rfl:    Multiple Vitamins-Minerals (CENTRUM SILVER PO), Take 1 tablet by mouth daily., Disp: , Rfl:    omeprazole (PRILOSEC) 20 MG capsule, Take 1 capsule (20 mg total) by mouth daily as needed., Disp: 90 capsule, Rfl: 2   rosuvastatin (CRESTOR) 40 MG tablet, TAKE 1 TABLET BY MOUTH AT  BEDTIME, Disp: 90 tablet, Rfl: 3   vitamin B-12 (CYANOCOBALAMIN)  1000 MCG tablet, Take 1,000 mcg by mouth daily., Disp: , Rfl:    Omega-3 Fatty Acids (FISH OIL) 1200 MG CAPS, Take 1,200 mg by mouth daily. (Patient not taking: Reported on 03/21/2023), Disp: , Rfl:    tiZANidine (ZANAFLEX) 4 MG tablet, Take 1 tablet (4 mg total) by mouth 2 (two) times daily as needed for muscle spasms., Disp: 30 tablet, Rfl: 0   topiramate (TOPAMAX) 25 MG tablet, Take 1 tablet (25 mg total) by mouth at bedtime. (Patient not taking: Reported on 03/21/2023), Disp: 90 tablet, Rfl: 3   Objective:     BP 115/64 (BP Location: Right Arm, Patient Position: Sitting, Cuff Size: Large)   Pulse 89   Temp 98 F (36.7 C) (Temporal)   Ht 6' (1.829 m)   Wt 236 lb 3.2 oz (107.1 kg)   SpO2 96%   BMI 32.03 kg/m    Physical Exam Constitutional:      General: He is not in acute distress.    Appearance: Normal appearance. He is not ill-appearing, toxic-appearing or diaphoretic.  HENT:     Head: Normocephalic and atraumatic.     Right Ear: External ear normal.     Left Ear: External ear normal.  Eyes:     General: No scleral icterus.       Right eye: No discharge.        Left eye: No discharge.     Extraocular Movements: Extraocular movements intact.     Conjunctiva/sclera: Conjunctivae normal.  Pulmonary:     Effort: Pulmonary effort is normal. No respiratory distress.  Skin:    General: Skin is warm and dry.       Neurological:     Mental Status: He is alert and oriented to person, place, and time.  Psychiatric:        Mood and Affect: Mood normal.        Behavior: Behavior normal.      No results found for any visits on 03/21/23.    The ASCVD Risk score (Arnett DK, et al., 2019) failed to calculate for the following reasons:   The 2019 ASCVD risk score is only valid for ages 20 to 79    Assessment & Plan:   Skin lesion of right leg -     Ambulatory referral to Dermatology  B12 deficiency    Return Has scheduled follow-up in July.Carlyon Prows that he may be  able to discontinue omeprazole altogether.  Concerned that he may not be absorbing B12 in the longer.  Will continue daily high-dose B12.  Dermatology referral for skin lesion.  Would have to consider actinic keratoses, SCC, superficial spreading melanoma, psoriasis.  Mliss Sax, MD

## 2023-03-23 ENCOUNTER — Telehealth: Payer: Self-pay | Admitting: *Deleted

## 2023-03-23 NOTE — Telephone Encounter (Signed)
Willette Alma (Key: [id number]) PA Case ID #: [id number] Rx #: [id number]

## 2023-03-26 ENCOUNTER — Other Ambulatory Visit (HOSPITAL_COMMUNITY): Payer: Self-pay

## 2023-03-27 ENCOUNTER — Other Ambulatory Visit (HOSPITAL_COMMUNITY): Payer: Self-pay

## 2023-04-04 ENCOUNTER — Encounter: Payer: Self-pay | Admitting: "Endocrinology

## 2023-04-04 ENCOUNTER — Ambulatory Visit: Payer: Medicare Other | Admitting: "Endocrinology

## 2023-04-04 VITALS — BP 120/60 | HR 96 | Ht 73.0 in | Wt 264.0 lb

## 2023-04-04 DIAGNOSIS — Z9641 Presence of insulin pump (external) (internal): Secondary | ICD-10-CM | POA: Diagnosis not present

## 2023-04-04 DIAGNOSIS — Z794 Long term (current) use of insulin: Secondary | ICD-10-CM

## 2023-04-04 DIAGNOSIS — E1165 Type 2 diabetes mellitus with hyperglycemia: Secondary | ICD-10-CM

## 2023-04-04 DIAGNOSIS — E782 Mixed hyperlipidemia: Secondary | ICD-10-CM | POA: Diagnosis not present

## 2023-04-04 LAB — POCT GLYCOSYLATED HEMOGLOBIN (HGB A1C): Hemoglobin A1C: 7 % — AB (ref 4.0–5.6)

## 2023-04-04 NOTE — Progress Notes (Signed)
[facility] Note  [name] 06/29/1941 [id number]  Referring Provider: @REFPROVFULL @ Primary Care Provider: Mliss Sax, MD Reason for consultation: Chief Complaint  Patient presents with   Diabetes    Foot exam not due until 08/29/23    Assessment & Plan Cane was seen today for diabetes.  Diagnoses and all orders for this visit:  Uncontrolled type 2 diabetes mellitus with hyperglycemia (HCC) -     POCT glycosylated hemoglobin (Hb A1C)  Insulin pump in place  Mixed hypercholesterolemia and hypertriglyceridemia    1.Type 2 diabetes mellitus Hba1c goal less than 7.0, current Hba1c is 7. Will recommend for the following change of medications to:  Insulin Pump Settings: recommend as follows: Insulin pump: T-slim control IQ, DexCom G6  BASAL settings: 12-8 am = 1.65 8-12 noon= 1.8 Noon to 12 MN = 1.75  Carb 1:3  correction factor I: 25>100 Active insulin 3 hours  -check blood sugars before each meals and at bedtime -enter blood sugars in the pump and calibrate three times a day. Calibrate when fasting and at bedtime and one calibration before the meal -avoid calibrations when the blood sugar is rising rapidly or falling -pre bolus carbs when eating. Pre bolus needs to be 5 minutes before the meal -do not enter false or fake carbohydrates -do not enter carbohydratess after you eat and try to catch up with a high blood sugar -change sets on time to have consistent insulin absorption and prevent scar formation -call the office /answering service for blood sugar questions regarding hyper or hypoglycemia or sensor and transmitter issues -insulin settings updated and scanned in chart  No known contraindications to any of above medications  Hyperlipidemia -Last LDL off goal: 127 -on rosuvastatin 40 mg QD, ezetimibe 10 mg qd -Follow low fat diet and exercise, pt likes fried foods   -Blood pressure goal <140/90 - Microalbumin/creatinine goal <  30 -On losartan 25 mg qd -diet changes including salt restriction -limit eating outside -counseled BP targets per standards of diabetes care -Uncontrolled blood pressure can lead to retinopathy, nephropathy and cardiovascular and atherosclerotic heart disease  Reviewed and counseled on: -A1C target -Blood sugar targets -Complications of uncontrolled diabetes  -Checking blood sugar before meals and bedtime and bring log next visit -All medications with mechanism of action and side effects -Hypoglycemia management: rule of 15's, Glucagon Emergency Kit and medical alert ID -low-carb low-fat plate-method diet -At least 20 minutes of physical activity per day -Annual dilated retinal eye exam and foot exam -compliance and follow up needs -follow up as scheduled or earlier if problem gets worse  Call if blood sugar is less than 70 or consistently above 250    Take a 15 gm snack of carbohydrate at bedtime before you go to sleep if your blood sugar is less than 100.    If you are going to fast after midnight for a test or procedure, ask your physician for instructions on how to reduce/decrease your insulin dose.    Call if blood sugar is less than 70 or consistently above 250  -Treating a low sugar by rule of 15  (15 gms of sugar every 15 min until sugar is more than 70) If you feel your sugar is low, test your sugar to be sure If your sugar is low (less than 70), then take 15 grams of a fast acting Carbohydrate (3-4 glucose tablets or glucose gel or 4 ounces of juice or regular soda) Recheck your sugar 15 min after  treating low to make sure it is more than 70 If sugar is still less than 70, treat again with 15 grams of carbohydrate          Don't drive the hour of hypoglycemia  If unconscious/unable to eat or drink by mouth, use glucagon injection or nasal spray baqsimi and call 911. Can repeat again in 15 min if still unconscious.    I have reviewed current medications, nurse's notes,  allergies, vital signs, past medical and surgical history, family medical history, and social history for this encounter. Counseled patient on symptoms, examination findings, lab findings, imaging results, treatment decisions and monitoring and prognosis. The patient understood the recommendations and agrees with the treatment plan. All questions regarding treatment plan were fully answered.  Return in about 3 months (around 07/05/2023).   Altamese Makoti, MD 04/04/23    History of Present Illness [name] is a 82 y.o. year old male who presents to our clinic for a new referral for Type 2 diabetes mellitus. [name] was first diagnosed in 1986.   Previous history: Insulin was started in 1995  Recent history:     Non-insulin hypoglycemic drugs: None     Insulin pump: T-slim control IQ       BASAL settings: 12-8 am = 1.65 8-12 noon= 1.8 Noon to 12 MN = 1.75  Carb 1:4 dinner Carb ratio 1:3 breakfast, lunch  Active insulin 3 hours, correction factor I: 40>100   Ambulatory BG Monitoring     CGM interpretation: At today's visit, we reviewed her CGM downloads. The full report is scanned in the media. Reviewing the CGM trends, Bg trend high after meals    Insulin pump  Pump downloaded and reviewed. Blood sugars reviewed Basal rates reviewed Auto mode % reviewed Manual mode % reviewed Sensor wear % reviewed Readings above target % reviewed Readings at target % reviewed Readings below target % reviewed TDD units of insulin /day reviewed Changing sets on time reviewed Calibration reviewed Carb counting reviewed Basal bolus ratio reviewed Basal % reviewed Bolus % reviewed Duration of pump suspends reviewed No reaction at insulin pump sites Set change=every 3 days Reservoir change = every 3 days   Physical Exam  BP 120/60   Pulse 96   Ht 6\' 1"  (1.854 m)   Wt 264 lb (119.7 kg)   SpO2 96%   BMI 34.83 kg/m    Constitutional: well developed, well nourished Head:  normocephalic, atraumatic Eyes: sclera anicteric, no redness Neck: supple Lungs: normal respiratory effort Neurology: alert and oriented Skin: dry, no appreciable rashes Musculoskeletal: no appreciable defects Psychiatric: normal mood and affect  Current Medications Patient's Medications  New Prescriptions   No medications on file  Previous Medications   ACCU-CHEK FASTCLIX LANCETS MISC    USE TO CHECK BLOOD SUGAR UP TO 6 TIMES DAILY   ALFUZOSIN (UROXATRAL) 10 MG 24 HR TABLET    TAKE 1 TABLET BY MOUTH  DAILY WITH BREAKFAST   AMITRIPTYLINE (ELAVIL) 50 MG TABLET    TAKE ONE-HALF TABLET BY  MOUTH AT BEDTIME   AMLODIPINE (NORVASC) 2.5 MG TABLET    Take 1 tablet (2.5 mg total) by mouth daily.   BLOOD GLUCOSE MONITORING SUPPL (ACCU-CHEK AVIVA PLUS) W/DEVICE KIT    Use as directed   CAPSAICIN TOPICAL SYSTEM (QUTENZA, 4 PATCH,) 8 %    Apply 4 patches topically once for 1 dose. Applied to affected area every 3 months as needed by provider for neuropathy   CETIRIZINE (ZYRTEC) 10  MG TABLET    Take 10 mg by mouth daily as needed for allergies.   CONTINUOUS BLOOD GLUC SENSOR (DEXCOM G6 SENSOR) MISC    Inject 1 Device into the skin as directed. Apply to skin SQ every 10 days   DICLOFENAC SODIUM (VOLTAREN) 1 % GEL    Apply 2 g topically 4 (four) times daily.   DULOXETINE (CYMBALTA) 60 MG CAPSULE    Take 1 capsule (60 mg total) by mouth daily.   EZETIMIBE (ZETIA) 10 MG TABLET    Take 1 tablet (10 mg total) by mouth daily.   FLUTICASONE (FLONASE) 50 MCG/ACT NASAL SPRAY    Place 1 spray into the nose daily as needed for allergies.    FOLIC ACID (FOLVITE) 1 MG TABLET    Take 1 mg by mouth daily.   FUROSEMIDE (LASIX) 20 MG TABLET    TAKE 1 TABLET BY MOUTH  DAILY   GLUCOSE BLOOD (ACCU-CHEK AVIVA PLUS) TEST STRIP    USE TO TEST BLOOD SUGAR 6  TIMES PER DAY; E11.9   INSULIN INFUSION PUMP SUPPLIES (AUTOSOFT XC INFUSION SET) MISC    Inject 1 Act into the skin every other day. Use with 9mm cannula and 23 inch  tubing. *5 boxes (90 day supply)   INSULIN INFUSION PUMP SUPPLIES (T:SLIM INSULIN CARTRIDGE ) MISC    Use with insulin pump, fill once every 2 days.   INSULIN LISPRO (HUMALOG) 100 UNIT/ML INJECTION    INJECT SUBCUTANEOUSLY VIA  INSULIN PUMP TOTAL OF 120 UNITS  DAILY   LOSARTAN (COZAAR) 25 MG TABLET    Take 12.5 mg by mouth daily.   MULTIPLE VITAMINS-MINERALS (CENTRUM SILVER PO)    Take 1 tablet by mouth daily.   OMEGA-3 FATTY ACIDS (FISH OIL) 1200 MG CAPS    Take 1,200 mg by mouth daily.   OMEPRAZOLE (PRILOSEC) 20 MG CAPSULE    Take 1 capsule (20 mg total) by mouth daily as needed.   ROSUVASTATIN (CRESTOR) 40 MG TABLET    TAKE 1 TABLET BY MOUTH AT  BEDTIME   TIZANIDINE (ZANAFLEX) 4 MG TABLET    Take 1 tablet (4 mg total) by mouth 2 (two) times daily as needed for muscle spasms.   TOPIRAMATE (TOPAMAX) 25 MG TABLET    Take 1 tablet (25 mg total) by mouth at bedtime.   VITAMIN B-12 (CYANOCOBALAMIN) 1000 MCG TABLET    Take 1,000 mcg by mouth daily.  Modified Medications   No medications on file  Discontinued Medications   No medications on file    Allergies Allergies  Allergen Reactions   Atorvastatin Calcium    Lipitor [Atorvastatin] Other (See Comments)    unknown   Niacin     REACTION: Severe heartburn   Pioglitazone     REACTION: Edema    Past Medical History Past Medical History:  Diagnosis Date   Allergy    Anal fissure    Anemia, unspecified    Arthritis    Spinal OA   BACK PAIN, LUMBAR 10/23/2007   CARDIAC MURMUR, SYSTOLIC 10/27/2008   DIABETES MELLITUS, TYPE I 05/20/2007   DIASTOLIC DYSFUNCTION 11/04/2008   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HCC)    Duodenitis    Edema 05/12/2008   Esophageal stricture    GERD (gastroesophageal reflux disease)    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   Gynecomastia    Hepatic steatosis    HYPERLIPIDEMIA 05/20/2007   Hypertension    Hypogonadism male    Morbid obesity (HCC) 09/10/2009  OBSTRUCTIVE SLEEP APNEA 11/04/2007   Personal history  of colonic polyps    RENAL INSUFFICIENCY 05/12/2008   Sleep apnea    uses cpap   Squamous cell carcinoma of skin 02/16/2020   left lower leg, anterior-cx3,2fu   Urolithiasis     Past Surgical History Past Surgical History:  Procedure Laterality Date   Abdominal US  09/1997   arm fracture Left 1958   with hardware   Colon cancer screening     COLONOSCOPY  08/18/2004   diverticulitis   COLONOSCOPY  09/24/2009   ELECTROCARDIOGRAM  02/2006   FLEXIBLE SIGMOIDOSCOPY  03/04/2001   polyps, anal fissure   GANGLION CYST EXCISION Left 1980   HIP PINNING,CANNULATED Right 04/25/2021   Procedure: CANNULATED HIP PINNING;  Surgeon: Samson Frederic, MD;  Location: [facility];  Service: Orthopedics;  Laterality: Right;   Lower Arterial  04/13/2004   POLYPECTOMY     Rest Cardiolite  03/19/2003    Family History family history includes Colon cancer (age of onset: 91) in his brother; Colon polyps in his brother; Healthy in his daughter and son; Lung cancer in his brother; Other in his mother.  Social History Social History   Socioeconomic History   Marital status: Married    Spouse name: Not on file   Number of children: Not on file   Years of education: Not on file   Highest education level: Not on file  Occupational History    Employer: [employer]  Tobacco Use   Smoking status: Former    Packs/day: 2.00    Years: 31.00    Additional pack years: 0.00    Total pack years: 62.00    Types: Cigarettes    Start date: 26    Quit date: 10/23/1982    Years since quitting: 40.4   Smokeless tobacco: Never  Vaping Use   Vaping Use: Never used  Substance and Sexual Activity   Alcohol use: No    Alcohol/week: 0.0 standard drinks of alcohol   Drug use: No   Sexual activity: Not on file  Other Topics Concern   Not on file  Social History Narrative   Lives with wife in a one story home.  Has a son and a daughter.     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     Education: 2  years of college.   Social Determinants of Health   Financial Resource Strain: Low Risk  (01/26/2023)   Overall Financial Resource Strain (CARDIA)    Difficulty of Paying Living Expenses: Not hard at all  Food Insecurity: No Food Insecurity (01/26/2023)   Hunger Vital Sign    Worried About Running Out of Food in the Last Year: Never true    Ran Out of Food in the Last Year: Never true  Transportation Needs: No Transportation Needs (01/26/2023)   PRAPARE - Administrator, Civil Service (Medical): No    Lack of Transportation (Non-Medical): No  Physical Activity: Inactive (01/26/2023)   Exercise Vital Sign    Days of Exercise per Week: 0 days    Minutes of Exercise per Session: 0 min  Stress: No Stress Concern Present (01/26/2023)   [employer] of Occupational Health - Occupational Stress Questionnaire    Feeling of Stress : Not at all  Social Connections: Socially Integrated (01/10/2022)   Social Connection and Isolation Panel [NHANES]    Frequency of Communication with Friends and Family: Twice a week    Frequency of Social  Gatherings with Friends and Family: Twice a week    Attends Religious Services: 1 to 4 times per year    Active Member of Clubs or Organizations: Yes    Attends Banker Meetings: 1 to 4 times per year    Marital Status: Married  Catering manager Violence: Not At Risk (01/10/2022)   Humiliation, Afraid, Rape, and Kick questionnaire    Fear of Current or Ex-Partner: No    Emotionally Abused: No    Physically Abused: No    Sexually Abused: No    Lab Results  Component Value Date   HGBA1C 7.0 (A) 04/04/2023   Lab Results  Component Value Date   CHOL 203 (H) 03/13/2023   Lab Results  Component Value Date   HDL 43 03/13/2023   Lab Results  Component Value Date   LDLCALC 127 (H) 03/13/2023   Lab Results  Component Value Date   TRIG 188 (H) 03/13/2023   Lab Results  Component Value Date   CHOLHDL 4.7 03/13/2023   Lab Results   Component Value Date   CREATININE 2.19 (H) 12/15/2022   Lab Results  Component Value Date   MICROALBUR 7.5 (H) 12/15/2022    Parts of this note may have been dictated using voice recognition software. There may be variances in spelling and vocabulary which are unintentional. Not all errors are proofread. Please notify the Thereasa Parkin if any discrepancies are noted or if the meaning of any statement is not clear.

## 2023-04-05 ENCOUNTER — Encounter: Payer: Self-pay | Admitting: "Endocrinology

## 2023-04-11 ENCOUNTER — Ambulatory Visit: Payer: Medicare Other | Attending: Internal Medicine | Admitting: Pharmacist

## 2023-04-11 ENCOUNTER — Telehealth: Payer: Self-pay | Admitting: Pharmacist

## 2023-04-11 DIAGNOSIS — E785 Hyperlipidemia, unspecified: Secondary | ICD-10-CM

## 2023-04-11 MED ORDER — REPATHA SURECLICK 140 MG/ML ~~LOC~~ SOAJ: 140.0000 mg | SUBCUTANEOUS | 11 refills | Status: DC

## 2023-04-11 NOTE — Progress Notes (Signed)
Patient ID: [name]                 DOB: 1941/01/14                    MRN: [id number]     HPI: [name] is a 82 y.o. male patient referred to lipid clinic by Dr Izora Ribas. PMH is significant for T1DM, HTN, HLD, CKD IIIa, obesity, and OSA on CPAP. LDL previously well controlled in the 50s-60s on rosuvastatin 40mg  daily. Then LDL went up to 128, ezetimibe was added, and LDL remained unchanged at 409. Pt referred to lipid clinic for further management.  Pt presents today for follow up with his wife. Confirms he is adherent to rosuvastatin and ezetimibe. Not clear why his LDL doubled with no change in his rosuvastatin, or why his LDL did not drop at all on ezetimibe. Doesn't recall prior issue with atorvastatin, does mention aches but states he has these all the time. Comfortable with injections. Diet is poor, minimal exercise.  Current Medications: rosuvastatin 40mg  daily, ezetimibe 10mg  daily Intolerances: atorvastatin - not sure Risk Factors: DM, CKD, HTN, age LDL goal: 70mg /dL  Diet:  Breakfast - muffin or bagel Lunch - goes out to eat, cracker barrel, likes fried food Dinner - sandwich or cereal Snacks on ice cream and hot dogs  Exercise: Minimal  Family History: Colon cancer (age of onset: 16) in his brother; Colon polyps in his brother; Healthy in his daughter and son; Lung cancer in his brother.  Social History: Former tobacco use 2 PPD x 31 years, quit in 1984. No alcohol or drug use.  Labs: 03/13/23: TC 203, TG 188, HDL 43, LDL 127 - rosuvastatin 40mg  daily, ezetimibe 10mg  daily 11/29/22: TC 198, TG 134, HDL 46, LDL 128 - rosuvastatin 40mg  daily 02/28/21: TC 135, TG 174, HDL 33.6, LDL 66 - rosuvastatin 40mg  daily 07/10/19: TC 116, TG 137, HDL 32.2, LDL 57 - rosuvastatin 40mg  daily  Past Medical History:  Diagnosis Date   Allergy    Anal fissure    Anemia, unspecified    Arthritis    Spinal OA   BACK PAIN, LUMBAR 10/23/2007   CARDIAC MURMUR, SYSTOLIC 10/27/2008    DIABETES MELLITUS, TYPE I 05/20/2007   DIASTOLIC DYSFUNCTION 11/04/2008   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HCC)    Duodenitis    Edema 05/12/2008   Esophageal stricture    GERD (gastroesophageal reflux disease)    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   Gynecomastia    Hepatic steatosis    HYPERLIPIDEMIA 05/20/2007   Hypertension    Hypogonadism male    Morbid obesity (HCC) 09/10/2009   OBSTRUCTIVE SLEEP APNEA 11/04/2007   Personal history of colonic polyps    RENAL INSUFFICIENCY 05/12/2008   Sleep apnea    uses cpap   Squamous cell carcinoma of skin 02/16/2020   left lower leg, anterior-cx3,10fu   Urolithiasis     Current Outpatient Medications on File Prior to Visit  Medication Sig Dispense Refill   Accu-Chek FastClix Lancets MISC USE TO CHECK BLOOD SUGAR UP TO 6 TIMES DAILY 612 each 3   alfuzosin (UROXATRAL) 10 MG 24 hr tablet TAKE 1 TABLET BY MOUTH  DAILY WITH BREAKFAST 90 tablet 3   amitriptyline (ELAVIL) 50 MG tablet TAKE ONE-HALF TABLET BY  MOUTH AT BEDTIME 45 tablet 3   amLODipine (NORVASC) 2.5 MG tablet Take 1 tablet (2.5 mg total) by mouth daily. 90 tablet 3   Blood Glucose Monitoring Suppl (  ACCU-CHEK AVIVA PLUS) w/Device KIT Use as directed 1 kit 0   [START ON 04/19/2023] capsaicin topical system (QUTENZA, 4 PATCH,) 8 % Apply 4 patches topically once for 1 dose. Applied to affected area every 3 months as needed by provider for neuropathy 4 patch 0   cetirizine (ZYRTEC) 10 MG tablet Take 10 mg by mouth daily as needed for allergies.     Continuous Blood Gluc Sensor (DEXCOM G6 SENSOR) MISC Inject 1 Device into the skin as directed. Apply to skin SQ every 10 days 9 each 3   diclofenac Sodium (VOLTAREN) 1 % GEL Apply 2 g topically 4 (four) times daily. 150 g 3   DULoxetine (CYMBALTA) 60 MG capsule Take 1 capsule (60 mg total) by mouth daily. 90 capsule 3   ezetimibe (ZETIA) 10 MG tablet Take 1 tablet (10 mg total) by mouth daily. 90 tablet 3   fluticasone (FLONASE) 50 MCG/ACT nasal spray  Place 1 spray into the nose daily as needed for allergies.      folic acid (FOLVITE) 1 MG tablet Take 1 mg by mouth daily.     furosemide (LASIX) 20 MG tablet TAKE 1 TABLET BY MOUTH  DAILY 90 tablet 3   glucose blood (ACCU-CHEK AVIVA PLUS) test strip USE TO TEST BLOOD SUGAR 6  TIMES PER DAY; E11.9 600 each 11   Insulin Infusion Pump Supplies (AUTOSOFT XC INFUSION SET) MISC Inject 1 Act into the skin every other day. Use with 9mm cannula and 23 inch tubing. *5 boxes (90 day supply) 5 each 3   Insulin Infusion Pump Supplies (T:SLIM INSULIN CARTRIDGE ) MISC Use with insulin pump, fill once every 2 days. 5 each 3   insulin lispro (HUMALOG) 100 UNIT/ML injection INJECT SUBCUTANEOUSLY VIA  INSULIN PUMP TOTAL OF 120 UNITS  DAILY 110 mL 0   losartan (COZAAR) 25 MG tablet Take 12.5 mg by mouth daily.     Multiple Vitamins-Minerals (CENTRUM SILVER PO) Take 1 tablet by mouth daily.     Omega-3 Fatty Acids (FISH OIL) 1200 MG CAPS Take 1,200 mg by mouth daily.     omeprazole (PRILOSEC) 20 MG capsule Take 1 capsule (20 mg total) by mouth daily as needed. 90 capsule 2   rosuvastatin (CRESTOR) 40 MG tablet TAKE 1 TABLET BY MOUTH AT  BEDTIME 90 tablet 3   tiZANidine (ZANAFLEX) 4 MG tablet Take 1 tablet (4 mg total) by mouth 2 (two) times daily as needed for muscle spasms. 30 tablet 0   topiramate (TOPAMAX) 25 MG tablet Take 1 tablet (25 mg total) by mouth at bedtime. 90 tablet 3   vitamin B-12 (CYANOCOBALAMIN) 1000 MCG tablet Take 1,000 mcg by mouth daily.     No current facility-administered medications on file prior to visit.    Allergies  Allergen Reactions   Atorvastatin Calcium    Lipitor [Atorvastatin] Other (See Comments)    unknown   Niacin     REACTION: Severe heartburn   Pioglitazone     REACTION: Edema    Assessment/Plan:  1. Hyperlipidemia - LDL 127 on rosuvastatin 40mg  daily and ezetimibe 10mg  daily. LDL was 128 before ezetimibe was added. Confirmed med adherence, will stop ezetimibe  due to lack of response. LDL previously in the 50s-60s on rosuvastatin 40mg  daily, unclear why LDL has increased this year on the same med, pt reports adherence with this as well. Diet and exercise are poor, may be contributing. He will continue on his rosuvastatin. Discussed adding Repatha which pt is  agreeable to, ok with $45 copay. Will submit prior auth and follow up with pt once approved. Will schedule follow up labs at that time.  [name] E. Lousie Calico, PharmD, BCACP, CPP [facility] [address], [place], [place] [address] Phone: [phone]; Fax: [phone] 04/11/2023 2:57 PM

## 2023-04-11 NOTE — Telephone Encounter (Signed)
Repatha PA submitted, Key: [id number].

## 2023-04-11 NOTE — Telephone Encounter (Signed)
Prior auth approved through 10/11/23. Rx sent to pharmacy, f/u labs scheduled. Pt appreciative for the call.

## 2023-04-11 NOTE — Patient Instructions (Signed)
Your LDL cholesterol is 127 and your goal is < 70  Stop taking ezetimibe (Zetia)  Continue taking rosuvastatin (Crestor)  I will submit information to your insurance for Repatha and let you know when I hear back.    Repatha is a subcutaneous injection given once every 2 weeks in the fatty tissue of your stomach or upper outer thigh. Store the medication in the fridge. You can let your dose warm up to room temperature for 30 minutes before injecting if you prefer. Repatha will lower your LDL cholesterol by 60% and helps to lower your chance of having a heart attack or stroke.

## 2023-04-13 ENCOUNTER — Other Ambulatory Visit: Payer: Self-pay | Admitting: Endocrinology

## 2023-04-13 DIAGNOSIS — E0800 Diabetes mellitus due to underlying condition with hyperosmolarity without nonketotic hyperglycemic-hyperosmolar coma (NKHHC): Secondary | ICD-10-CM

## 2023-04-18 NOTE — Progress Notes (Signed)
Subjective:    Patient ID: [name], male    DOB: Mar 17, 1941, 82 y.o.   MRN: [id number]   HPI  Male with pmh of OSA, CKD, HTN, DM type 1 who presents for f/u of diabetic peripheral neuropathy and low back pain > neck pain.   On first visit, pt complained of b/l L>R back pain.  Started ~>20 years ago.  No inciting event.  Sitting/laying improve the pain.  Standing exacerbates the pain.  Sharp, burning pain.  Radiates to left posterior thigh.  Intermittent.  He has associated numbness/tingling.  He only tried ASA, which helps some.  Pain limits from doing ADLs and fishing.  He denies falls. Back pain has been ok especially if sitting down Can't stand more than 5 or 10 minutes Tried PT and cannot feel difference -pain is worst on left.  Last Qutenza patch helped a lot for his back but he was charged $700, [place] sent the patches to speciality pharmacy this time and they were 100% covered Qutenza patches helped on feet where he has tingling. He has less pain in his feet except for his toes which he thinks is due to gout [place] since he was ere last, landed on his knees He is doing well decreasing snacking after dinner 169 CBG right now.  -he usually snacks at night on potato chips after dinner -does not eat much more baked potatoes Voltaren cream helps the back pain but does not stop it When he fishes he feels pain from the jolting- fishing made it worse -He takes advil and this helps a little.  Sometimes it moves into his legs.   Last clinic visit on 10/21/2020.  He had trigger point injections at that time.  Since that time, patient states he had great benefit for about a week.  He uses a TENS with benefit. Patient states he feels he is getting weaker. He is taking Baclofen 1-2/week.  Sleep has improved. He is taking Elavil 25.  He states he believe he lost weight, but not exercising. Denies falls. Lightheadedness has improved.    He does not take any other medications for his pain and does  not desire to. He requires a cane for ambulation and has a handicap placard.   Blood pressure is better controlled today at 120/74  He has been to multiple speciialists and was told he will not be able to see from this eye after her had a fall on this eye Has not been using SPRINT PNS but it did help when he used it.  Has been having headaches.  -he is ready to try Qutenza today -he has numbness and tingling from his shins down to his feet and it is constant and feels uncomfortable -his back pain has also been severe -wife accompanies him today -he has decreased sensation in both feet   Pain Inventory Average Pain 4 Pain Right Now 0 My pain is intermittent, sharp, and tingling  In the last 24 hours, has pain interfered with the following? General activity 3 Relation with others 0 Enjoyment of life 3 What TIME of day is your pain at its worst? daytime Sleep (in general) Fair  Pain is worse with: walking and standing Pain improves with: heat/ice Relief from Meds: 0     Family History  Problem Relation Age of Onset   Colon cancer Brother 23   Colon polyps Brother    Lung cancer Brother    Other Mother  MVA, deceased 31s   Healthy Daughter    Healthy Son    Rectal cancer Neg Hx    Stomach cancer Neg Hx    Social History   Socioeconomic History   Marital status: Married    Spouse name: Not on file   Number of children: Not on file   Years of education: Not on file   Highest education level: Not on file  Occupational History    Employer: [employer]  Tobacco Use   Smoking status: Former    Packs/day: 2.00    Years: 31.00    Additional pack years: 0.00    Total pack years: 62.00    Types: Cigarettes    Start date: 30    Quit date: 10/23/1982    Years since quitting: 40.5   Smokeless tobacco: Never  Vaping Use   Vaping Use: Never used  Substance and Sexual Activity   Alcohol use: No    Alcohol/week: 0.0 standard drinks of alcohol   Drug use:  No   Sexual activity: Not on file  Other Topics Concern   Not on file  Social History Narrative   Lives with wife in a one story home.  Has a son and a daughter.     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     Education: 2 years of college.   Social Determinants of Health   Financial Resource Strain: Low Risk  (01/26/2023)   Overall Financial Resource Strain (CARDIA)    Difficulty of Paying Living Expenses: Not hard at all  Food Insecurity: No Food Insecurity (01/26/2023)   Hunger Vital Sign    Worried About Running Out of Food in the Last Year: Never true    Ran Out of Food in the Last Year: Never true  Transportation Needs: No Transportation Needs (01/26/2023)   PRAPARE - Administrator, Civil Service (Medical): No    Lack of Transportation (Non-Medical): No  Physical Activity: Inactive (01/26/2023)   Exercise Vital Sign    Days of Exercise per Week: 0 days    Minutes of Exercise per Session: 0 min  Stress: No Stress Concern Present (01/26/2023)   [employer] of Occupational Health - Occupational Stress Questionnaire    Feeling of Stress : Not at all  Social Connections: Socially Integrated (01/10/2022)   Social Connection and Isolation Panel [NHANES]    Frequency of Communication with Friends and Family: Twice a week    Frequency of Social Gatherings with Friends and Family: Twice a week    Attends Religious Services: 1 to 4 times per year    Active Member of [employer] or Organizations: Yes    Attends Banker Meetings: 1 to 4 times per year    Marital Status: Married   Past Surgical History:  Procedure Laterality Date   Abdominal US  09/1997   arm fracture Left 1958   with hardware   Colon cancer screening     COLONOSCOPY  08/18/2004   diverticulitis   COLONOSCOPY  09/24/2009   ELECTROCARDIOGRAM  02/2006   FLEXIBLE SIGMOIDOSCOPY  03/04/2001   polyps, anal fissure   GANGLION CYST EXCISION Left 1980   HIP PINNING,CANNULATED Right  04/25/2021   Procedure: CANNULATED HIP PINNING;  Surgeon: Samson Frederic, MD;  Location: [facility];  Service: Orthopedics;  Laterality: Right;   Lower Arterial  04/13/2004   POLYPECTOMY     Rest Cardiolite  03/19/2003   Past Medical History:  Diagnosis Date  Allergy    Anal fissure    Anemia, unspecified    Arthritis    Spinal OA   BACK PAIN, LUMBAR 10/23/2007   CARDIAC MURMUR, SYSTOLIC 10/27/2008   DIABETES MELLITUS, TYPE I 05/20/2007   DIASTOLIC DYSFUNCTION 11/04/2008   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HCC)    Duodenitis    Edema 05/12/2008   Esophageal stricture    GERD (gastroesophageal reflux disease)    GOUT 05/20/2007   Gynecomastia    Hepatic steatosis    HYPERLIPIDEMIA 05/20/2007   Hypertension    Hypogonadism male    Morbid obesity (HCC) 09/10/2009   OBSTRUCTIVE SLEEP APNEA 11/04/2007   Personal history of colonic polyps    RENAL INSUFFICIENCY 05/12/2008   Sleep apnea    uses cpap   Squamous cell carcinoma of skin 02/16/2020   left lower leg, anterior-cx3,76fu   Urolithiasis    There were no vitals taken for this visit.  Opioid Risk Score:   Fall Risk Score:  `1  Dep[facility] screen [facility] 2/9     03/21/2023    8:40 AM 01/26/2023    2:18 PM 01/18/2023   10:17 AM 11/21/2022   10:42 AM 09/19/2022   11:45 AM 05/24/2022   10:30 AM 02/16/2022    9:48 AM  Depression screen PHQ 2/9  Decreased Interest 0 0 0 0 0 0 0  Down, Depressed, Hopeless 0 0 0 0 0 0 0  PHQ - 2 Score 0 0 0 0 0 0 0    Review of Systems  HENT: Negative.    Eyes: Negative.   Respiratory:  Positive for apnea and shortness of breath.   Cardiovascular: Negative.   Gastrointestinal: Negative.   Endocrine:       Diabetic High blood sugar  Genitourinary: Negative.           Musculoskeletal:  Positive for arthralgias, back pain, gait problem, myalgias and neck pain.  Skin: Negative.   Allergic/Immunologic: Negative.   Neurological:  Positive for weakness and numbness.       Tingling   Hematological: Negative.   All other systems reviewed and are negative.     Objective:   Physical Exam  Constitutional: No distress . Vital signs reviewed. BMI 36.82, weight 264 lbs, 144/79 Gen: no distress, normal appearing HEENT: oral mucosa pink and moist, NCAT Cardio: Reg rate Chest: normal effort, normal rate of breathing Abd: soft, non-distended Ext: no edema Psych: pleasant, normal affect Skin: intact Musc: TTP lumbar spinous process Musc: Gait: Mildly antalgic. Ambulating with cane at all times.  Neuro: Alert  HOH             Strength          5/5 in all LE myotomes Decreased sensation in bilateral feet    Assessment & Plan:  Male with pmh of OSA, CKD, HTN, DM type 1 with peripheral neuropathy presents for follow up of low back pain > neck pain.     1. Chronic mechanical low back pain             MRIs C,L-spine early 2016 suggesting multilevel spondylosis with mild b/l multilevel foraminal narrowing C4-C7 and shallow disc bulge at L5-S1 without central canal or foraminal stenosis.              Avoid NSAIDs due to CKD, discussed with patient since he takes Advil  Continue voltaren gel, advised that he could use up to 4 times per day.  Unable to tolerate Gabapentin, Lyrica             Cont HEP  Discussed repeat MIR of lumbar spine  Continue TENS             Aquatic therapy, not going anymore, states ineffective             Cont tylenol  Robaxin 500 TID PRN, changed to Baclofen 10 TID PRN due to change insurance, continue   Continue Cymbalta 60mg              Cont back brace during periods of excessive activity, reminded to avoid prolonged use  Continue Lidoderm OTC, reminded              Encouraged rest breaks             Acknowledges he can do more if he make a conscious effort             PT completed    Pain over spinous process where clothing is resulting increased pressure.  Encourage change in clothing.   Sprint PNS removed today.   -Provided with  a pain relief journal and discussed that it contains foods and lifestyle tips to naturally help to improve pain. Discussed that these lifestyle strategies are also very good for health unlike some medications which can have negative side effects. Discussed that the act of keeping a journal can be therapeutic and helpful to realize patterns what helps to trigger and alleviate pain.  -Discussed Qutenza as an option for neuropathic pain control. Discussed that this is a capsaicin patch, stronger than capsaicin cream. Discussed that it is currently approved for diabetic peripheral neuropathy and post-herpetic neuralgia, but that it has also shown benefit in treating other forms of neuropathy. Provided patient with link to site to learn more about the patch: https://www.clark.biz/. Discussed that the patch would be placed in office and benefits usually last 3 months. Discussed that unintended exposure to capsaicin can cause severe irritation of eyes, mucous membranes, respiratory tract, and skin, but that Qutenza is a local treatment and does not have the systemic side effects of other nerve medications. Discussed that there may be pain, itching, erythema, and decreased sensory function associated with the application of Qutenza. Side effects usually subside within 1 week. A cold pack of analgesic medications can help with these side effects. Blood pressure can also be increased due to pain associated with administration of the patch.   2. Sacroiliitis             See #1             Continue brace             Not main pain generator at present, does not wish to proceed with injection at this time, particularly due to DM   3. Sleep disturbance             Recently new mattress             Continue CPAP             Improved  Insomnia: -Try to go outside near sunrise -Get exercise during the day.  -Turn off all devices an hour before bedtime.  -Teas that can benefit: chamomile, valerian root, Brahmi  (Bacopa) -Can consider over the counter melatonin, magnesium, and/or L-theanine. Melatonin is an anti-oxidant with multiple health benefits. Magnesium is involved in greater than 300 enzymatic reactions in the body and most of [place] are deficient  as our soil is often depleted. There are 7 different types of magnesium- Bioptemizer's is a supplement with all 7 types, and each has unique benefits. Magnesium can also help with constipation and anxiety.  -Pistachios naturally increase the production of melatonin -Cozy Earth bamboo bed sheets are free from toxic chemicals.  -Tart cherry juice or a tart cherry supplement can improve sleep and soreness post-workout     4. Myalgia  Will schedule for trigger point injections   5. Diabetic neuropathy             See #1, #3             Side effects with elavil to 50mg  qhs, continue 25mg  educated on signs/symptoms of seratonin syndrome previously  Provided dietary education  Refilled Cymbalta.   Recommended no snacking after dinner -Discussed current symptoms of pain and history of pain.  -Discussed benefits of exercise in reducing pain. -Discussed following foods that may reduce pain: 1) Ginger (especially studied for arthritis)- reduce leukotriene production to decrease inflammation 2) Blueberries- high in phytonutrients that decrease inflammation 3) Salmon- marine omega-3s reduce joint swelling and pain 4) Pumpkin seeds- reduce inflammation 5) dark chocolate- reduces inflammation 6) turmeric- reduces inflammation 7) tart cherries - reduce pain and stiffness 8) extra virgin olive oil - its compound olecanthal helps to block prostaglandins  9) chili peppers- can be eaten or applied topically via capsaicin 10) mint- helpful for headache, muscle aches, joint pain, and itching 11) garlic- reduces inflammation  Link to further information on diet for chronic pain: http://www.bray.com/    --Discussed Qutenza as an option for neuropathic pain control. Discussed that this is a capsaicin patch, stronger than capsaicin cream. Discussed that it is currently approved for diabetic peripheral neuropathy and post-herpetic neuralgia, but that it has also shown benefit in treating other forms of neuropathy. Provided patient with link to site to learn more about the patch: https://www.clark.biz/. Discussed that the patch would be placed in office and benefits usually last 3 months. Discussed that unintended exposure to capsaicin can cause severe irritation of eyes, mucous membranes, respiratory tract, and skin, but that Qutenza is a local treatment and does not have the systemic side effects of other nerve medications. Discussed that there may be pain, itching, erythema, and decreased sensory function associated with the application of Qutenza. Side effects usually subside within 1 week. A cold pack of analgesic medications can help with these side effects. Blood pressure can also be increased due to pain associated with administration of the patch.    6. Morbid obesity             Not interested in seeing dietitian at this time  Encouraged activity   No attempts at weight loss  Discussed Topamax.   Commended on improvement!   7. Gait abnormality             Completed therapies, cont HEP  Continue cane for safety   8. Nonhealing ulcer             Left lateral foot, improving  9. Lightheadedness  Encouraged BP monitoring at home  10. Knee pain: -recommended applying voltaren gel  11. Chronic gout: continue allopurinol, recommended to avoid fructose  40 minutes spent in discussion of risks and benefits of Qutenza and obtaining informed consent, discussion of q90 day follow-up and expectation of improvement in pain with each repeat application

## 2023-04-19 ENCOUNTER — Encounter
Payer: Medicare Other | Attending: Physical Medicine and Rehabilitation | Admitting: Physical Medicine and Rehabilitation

## 2023-04-19 VITALS — BP 120/72 | HR 87 | Ht 73.0 in | Wt 263.0 lb

## 2023-04-19 DIAGNOSIS — R42 Dizziness and giddiness: Secondary | ICD-10-CM | POA: Diagnosis present

## 2023-04-19 DIAGNOSIS — Z794 Long term (current) use of insulin: Secondary | ICD-10-CM

## 2023-04-19 DIAGNOSIS — M47819 Spondylosis without myelopathy or radiculopathy, site unspecified: Secondary | ICD-10-CM

## 2023-04-19 DIAGNOSIS — E1142 Type 2 diabetes mellitus with diabetic polyneuropathy: Secondary | ICD-10-CM | POA: Diagnosis present

## 2023-04-19 DIAGNOSIS — R269 Unspecified abnormalities of gait and mobility: Secondary | ICD-10-CM | POA: Insufficient documentation

## 2023-04-19 DIAGNOSIS — M461 Sacroiliitis, not elsewhere classified: Secondary | ICD-10-CM

## 2023-04-19 DIAGNOSIS — G4701 Insomnia due to medical condition: Secondary | ICD-10-CM

## 2023-04-19 DIAGNOSIS — M1A9XX0 Chronic gout, unspecified, without tophus (tophi): Secondary | ICD-10-CM | POA: Diagnosis present

## 2023-04-19 MED ORDER — GABAPENTIN 300 MG PO CAPS: 300.0000 mg | ORAL_CAPSULE | Freq: Three times a day (TID) | ORAL | 3 refills | Status: DC

## 2023-04-24 ENCOUNTER — Ambulatory Visit: Payer: Medicare Other | Admitting: Family Medicine

## 2023-05-04 ENCOUNTER — Other Ambulatory Visit: Payer: Self-pay | Admitting: Orthopedic Surgery

## 2023-05-04 DIAGNOSIS — M25562 Pain in left knee: Secondary | ICD-10-CM

## 2023-05-09 ENCOUNTER — Encounter: Payer: Self-pay | Admitting: Orthopedic Surgery

## 2023-05-15 ENCOUNTER — Ambulatory Visit
Admission: RE | Admit: 2023-05-15 | Discharge: 2023-05-15 | Disposition: A | Discharge status: Home / Self Care | Payer: Medicare Other | Source: Ambulatory Visit | Attending: Orthopedic Surgery | Admitting: Orthopedic Surgery

## 2023-05-15 DIAGNOSIS — M25562 Pain in left knee: Secondary | ICD-10-CM

## 2023-05-17 ENCOUNTER — Other Ambulatory Visit: Payer: Self-pay | Admitting: Physical Medicine and Rehabilitation

## 2023-05-19 ENCOUNTER — Other Ambulatory Visit: Payer: Medicare Other

## 2023-05-22 ENCOUNTER — Ambulatory Visit: Payer: Medicare Other | Admitting: Family Medicine

## 2023-05-23 ENCOUNTER — Ambulatory Visit: Payer: Medicare Other | Admitting: Family Medicine

## 2023-05-23 ENCOUNTER — Encounter: Payer: Self-pay | Admitting: Family Medicine

## 2023-05-23 VITALS — BP 140/70 | HR 61 | Temp 97.8°F | Ht 73.0 in | Wt 262.3 lb

## 2023-05-23 DIAGNOSIS — N1831 Chronic kidney disease, stage 3a: Secondary | ICD-10-CM

## 2023-05-23 DIAGNOSIS — E538 Deficiency of other specified B group vitamins: Secondary | ICD-10-CM | POA: Diagnosis not present

## 2023-05-23 DIAGNOSIS — Z8739 Personal history of other diseases of the musculoskeletal system and connective tissue: Secondary | ICD-10-CM | POA: Diagnosis not present

## 2023-05-23 LAB — BASIC METABOLIC PANEL
BUN: 21 mg/dL (ref 6–23)
CO2: 31 mEq/L (ref 19–32)
Calcium: 9.4 mg/dL (ref 8.4–10.5)
Chloride: 103 mEq/L (ref 96–112)
Creatinine, Ser: 1.75 mg/dL — ABNORMAL HIGH (ref 0.40–1.50)
GFR: 36.04 mL/min — ABNORMAL LOW (ref >60.00)
Glucose, Bld: 138 mg/dL — ABNORMAL HIGH (ref 70–99)
Potassium: 3.8 mEq/L (ref 3.5–5.1)
Sodium: 144 mEq/L (ref 135–145)

## 2023-05-23 LAB — URIC ACID: Uric Acid, Serum: 6.8 mg/dL (ref 4.0–7.8)

## 2023-05-23 LAB — VITAMIN B12: Vitamin B-12: 1009 pg/mL — ABNORMAL HIGH (ref 211–911)

## 2023-05-23 NOTE — Progress Notes (Signed)
Established Patient Office Visit   Subjective:  Patient ID: [name], male    DOB: 1941/04/27  Age: 82 y.o. MRN: [id number]  Chief Complaint  Patient presents with   Medical Management of Chronic Issues    2 month follow up the patient states he is doing well. Took himself off of omeprazole.     HPI Encounter Diagnoses  Name Primary?   B12 deficiency Yes   History of gout    Stage 3a chronic kidney disease (HCC)    Doing okay.  Status post MRI of the left knee that confirmed internal derangement and severe medial loss of joint space.  He has been able to discontinue omeprazole entirely.  Denies any current reflux.  Continues with multivitamin.  Continues with Uroxatrol.  Blood pressure well-controlled on current regimen.  Continues follow-up with endocrinology.  Recently started Repatha having no issues taking it.   Review of Systems  Constitutional: Negative.   HENT: Negative.    Eyes:  Negative for blurred vision, discharge and redness.  Respiratory: Negative.    Cardiovascular: Negative.   Gastrointestinal:  Negative for abdominal pain.  Genitourinary: Negative.   Musculoskeletal:  Positive for joint pain. Negative for myalgias.  Skin:  Negative for rash.  Neurological:  Negative for tingling, loss of consciousness and weakness.  Endo/Heme/Allergies:  Negative for polydipsia.     Current Outpatient Medications:    Accu-Chek FastClix Lancets MISC, USE TO CHECK BLOOD SUGAR UP TO 6 TIMES DAILY, Disp: 612 each, Rfl: 3   alfuzosin (UROXATRAL) 10 MG 24 hr tablet, TAKE 1 TABLET BY MOUTH  DAILY WITH BREAKFAST, Disp: 90 tablet, Rfl: 3   amitriptyline (ELAVIL) 50 MG tablet, TAKE ONE-HALF TABLET BY MOUTH AT BEDTIME, Disp: 45 tablet, Rfl: 3   amLODipine (NORVASC) 2.5 MG tablet, Take 1 tablet (2.5 mg total) by mouth daily., Disp: 90 tablet, Rfl: 3   Blood Glucose Monitoring Suppl (ACCU-CHEK AVIVA PLUS) w/Device KIT, Use as directed, Disp: 1 kit, Rfl: 0   cetirizine (ZYRTEC) 10 MG  tablet, Take 10 mg by mouth daily as needed for allergies., Disp: , Rfl:    Continuous Blood Gluc Sensor (DEXCOM G6 SENSOR) MISC, Inject 1 Device into the skin as directed. Apply to skin SQ every 10 days, Disp: 9 each, Rfl: 3   diclofenac Sodium (VOLTAREN) 1 % GEL, Apply 2 g topically 4 (four) times daily., Disp: 150 g, Rfl: 3   Evolocumab (REPATHA SURECLICK) 140 MG/ML SOAJ, Inject 140 mg into the skin every 14 (fourteen) days., Disp: 2 mL, Rfl: 11   fluticasone (FLONASE) 50 MCG/ACT nasal spray, Place 1 spray into the nose daily as needed for allergies. , Disp: , Rfl:    folic acid (FOLVITE) 1 MG tablet, Take 1 mg by mouth daily., Disp: , Rfl:    furosemide (LASIX) 20 MG tablet, TAKE 1 TABLET BY MOUTH  DAILY, Disp: 90 tablet, Rfl: 3   glucose blood (ACCU-CHEK AVIVA PLUS) test strip, USE TO TEST BLOOD SUGAR 6  TIMES PER DAY; E11.9, Disp: 600 each, Rfl: 11   Insulin Infusion Pump Supplies (AUTOSOFT XC INFUSION SET) MISC, Inject 1 Act into the skin every other day. Use with 9mm cannula and 23 inch tubing. *5 boxes (90 day supply), Disp: 5 each, Rfl: 3   Insulin Infusion Pump Supplies (T:SLIM INSULIN CARTRIDGE ) MISC, Use with insulin pump, fill once every 2 days., Disp: 5 each, Rfl: 3   insulin lispro (HUMALOG) 100 UNIT/ML injection, INJECT SUBCUTANEOUSLY VIA  INSULIN  PUMP TOTAL OF 120 UNITS  DAILY, Disp: 110 mL, Rfl: 0   Multiple Vitamins-Minerals (CENTRUM SILVER PO), Take 1 tablet by mouth daily., Disp: , Rfl:    Omega-3 Fatty Acids (FISH OIL) 1200 MG CAPS, Take 1,200 mg by mouth daily., Disp: , Rfl:    rosuvastatin (CRESTOR) 40 MG tablet, TAKE 1 TABLET BY MOUTH AT  BEDTIME, Disp: 90 tablet, Rfl: 3   vitamin B-12 (CYANOCOBALAMIN) 1000 MCG tablet, Take 1,000 mcg by mouth daily., Disp: , Rfl:    gabapentin (NEURONTIN) 300 MG capsule, Take 1 capsule (300 mg total) by mouth 3 (three) times daily. (Patient not taking: Reported on 05/23/2023), Disp: 270 capsule, Rfl: 3   Objective:     BP (!) 140/70  (BP Location: Right Arm, Patient Position: Sitting, Cuff Size: Large)   Pulse 61   Temp 97.8 F (36.6 C)   Ht 6\' 1"  (1.854 m)   Wt 262 lb 4.8 oz (119 kg)   SpO2 98%   BMI 34.61 kg/m    Physical Exam Constitutional:      General: He is not in acute distress.    Appearance: Normal appearance. He is not ill-appearing, toxic-appearing or diaphoretic.  HENT:     Head: Normocephalic and atraumatic.     Right Ear: External ear normal.     Left Ear: External ear normal.  Eyes:     General: No scleral icterus.       Right eye: No discharge.        Left eye: No discharge.     Extraocular Movements: Extraocular movements intact.     Conjunctiva/sclera: Conjunctivae normal.  Pulmonary:     Effort: Pulmonary effort is normal. No respiratory distress.  Skin:    General: Skin is warm and dry.  Neurological:     Mental Status: He is alert and oriented to person, place, and time.  Psychiatric:        Mood and Affect: Mood normal.        Behavior: Behavior normal.      No results found for any visits on 05/23/23.    The ASCVD Risk score (Arnett DK, et al., 2019) failed to calculate for the following reasons:   The 2019 ASCVD risk score is only valid for ages 66 to 39    Assessment & Plan:   B12 deficiency -     Vitamin B12  History of gout -     Uric acid  Stage 3a chronic kidney disease (HCC) -     Basic metabolic panel    Return in about 6 months (around 11/23/2023).  Continue current medications.  Exercise to the extent that you were able.  Discussed a possible nephrology consultation with further decline in GFR.  Rechecking B12 after discontinuation of PPI.  Continue working [employer].  Mliss Sax, MD

## 2023-06-11 ENCOUNTER — Ambulatory Visit: Payer: Medicare Other

## 2023-06-13 ENCOUNTER — Ambulatory Visit: Payer: Medicare Other | Attending: Internal Medicine

## 2023-06-13 DIAGNOSIS — E785 Hyperlipidemia, unspecified: Secondary | ICD-10-CM

## 2023-06-14 LAB — HEPATIC FUNCTION PANEL
ALT: 24 IU/L (ref 0–44)
AST: 20 IU/L (ref 0–40)
Albumin: 4 g/dL (ref 3.7–4.7)
Alkaline Phosphatase: 93 IU/L (ref 44–121)
Bilirubin Total: 0.4 mg/dL (ref 0.0–1.2)
Bilirubin, Direct: 0.15 mg/dL (ref 0.00–0.40)
Total Protein: 6.4 g/dL (ref 6.0–8.5)

## 2023-06-14 LAB — LIPID PANEL
Chol/HDL Ratio: 4.2 ratio (ref 0.0–5.0)
Cholesterol, Total: 161 mg/dL (ref 100–199)
HDL: 38 mg/dL — ABNORMAL LOW (ref >39)
LDL Chol Calc (NIH): 94 mg/dL (ref 0–99)
Triglycerides: 164 mg/dL — ABNORMAL HIGH (ref 0–149)
VLDL Cholesterol Cal: 29 mg/dL (ref 5–40)

## 2023-06-15 ENCOUNTER — Telehealth: Payer: Self-pay | Admitting: Pharmacist

## 2023-06-15 NOTE — Telephone Encounter (Signed)
Called pt to discuss recent cholesterol labs. Labs reflect pt on rosuvastatin 40mg  daily and Repatha 140mg  Q2W. Suboptimal decrease in LDL of only 26%. Prior labs showed pt on rosuvastatin 40mg  daily and ezetimibe 10mg  daily (ezetimibe stopped as he did not respond to med - LDL dropped 1 point from 128 to 127 after starting it and med adherence was confirmed). Spoke with pt, he reports adherence to rosuvastatin and Repatha. Will continue current meds, he sees his PCP in the next month or two. Asked him to have cholesterol rechecked, hopefully will have further LDL drop the longer he's on Repatha although he reports he did already give 5-6 injections.

## 2023-06-19 ENCOUNTER — Encounter: Payer: Medicare Other | Admitting: Physical Medicine and Rehabilitation

## 2023-06-26 ENCOUNTER — Encounter
Payer: Medicare Other | Attending: Physical Medicine and Rehabilitation | Admitting: Physical Medicine and Rehabilitation

## 2023-06-26 ENCOUNTER — Encounter: Payer: Self-pay | Admitting: Physical Medicine and Rehabilitation

## 2023-06-26 VITALS — BP 156/85 | HR 79 | Ht 73.0 in | Wt 268.0 lb

## 2023-06-26 DIAGNOSIS — M25362 Other instability, left knee: Secondary | ICD-10-CM | POA: Diagnosis present

## 2023-06-26 DIAGNOSIS — E1142 Type 2 diabetes mellitus with diabetic polyneuropathy: Secondary | ICD-10-CM | POA: Insufficient documentation

## 2023-06-26 DIAGNOSIS — C4490 Unspecified malignant neoplasm of skin, unspecified: Secondary | ICD-10-CM | POA: Insufficient documentation

## 2023-06-26 DIAGNOSIS — Z794 Long term (current) use of insulin: Secondary | ICD-10-CM

## 2023-06-26 DIAGNOSIS — M47819 Spondylosis without myelopathy or radiculopathy, site unspecified: Secondary | ICD-10-CM | POA: Insufficient documentation

## 2023-06-26 MED ORDER — LIDOCAINE 5 % EX PTCH: 1.0000 | MEDICATED_PATCH | CUTANEOUS | 0 refills | Status: DC

## 2023-06-26 NOTE — Progress Notes (Signed)
Subjective:    Patient ID: [name], male    DOB: January 23, 1941, 82 y.o.   MRN: [id number]   HPI  1) Left sided sciatica: -did not topamax because of potential side effect -is not taken gabapentin but would be interested in this -the pain radiates down his left leg to past his left knee  Male with pmh of OSA, CKD, HTN, DM type 1 who presents for f/u of diabetic peripheral neuropathy and low back pain > neck pain.   On first visit, pt complained of b/l L>R back pain.  Started ~>20 years ago.  No inciting event.  Sitting/laying improve the pain.  Standing exacerbates the pain.  Sharp, burning pain.  Radiates to left posterior thigh.  Intermittent.  He has associated numbness/tingling.  He only tried ASA, which helps some.  Pain limits from doing ADLs and fishing.  He denies falls. Back pain has been ok especially if sitting down Can't stand more than 5 or 10 minutes Tried PT and cannot feel difference -pain is worst on left.  Last Qutenza patch helped a lot for his back but he was charged $700, [place] sent the patches to speciality pharmacy this time and they were 100% covered Qutenza patches helped on feet where he has tingling. He has less pain in his feet except for his toes which he thinks is due to gout Larey Seat since he was ere last, landed on his knees He is doing well decreasing snacking after dinner 169 CBG right now.  -he usually snacks at night on potato chips after dinner -does not eat much more baked potatoes Voltaren cream helps the back pain but does not stop it When he fishes he feels pain from the jolting- fishing made it worse -He takes advil and this helps a little.  Sometimes it moves into his legs.   Last clinic visit on 10/21/2020.  He had trigger point injections at that time.  Since that time, patient states he had great benefit for about a week.  He uses a TENS with benefit. Patient states he feels he is getting weaker. He is taking Baclofen 1-2/week.  Sleep has  improved. He is taking Elavil 25.  He states he believe he lost weight, but not exercising. Denies falls. Lightheadedness has improved.    He does not take any other medications for his pain and does not desire to. He requires a cane for ambulation and has a handicap placard.   Blood pressure is better controlled today at 120/74  He has been to multiple speciialists and was told he will not be able to see from this eye after her had a fall on this eye Has not been using SPRINT PNS but it did help when he used it.  Has been having headaches.  -he is ready to try Qutenza today -he has numbness and tingling from his shins down to his feet and it is constant and feels uncomfortable -his back pain has also been severe -wife accompanies him today -he has decreased sensation in both feet   Pain Inventory Average Pain 5 Pain Right Now 3 My pain is intermittent, burning, and tingling  In the last 24 hours, has pain interfered with the following? General activity 3 Relation with others 0 Enjoyment of life 3 What TIME of day is your pain at its worst? daytime Sleep (in general) Good  Pain is worse with: walking and standing Pain improves with: rest Relief from Meds:  none taken  Family History  Problem Relation Age of Onset   Colon cancer Brother 89   Colon polyps Brother    Lung cancer Brother    Other Mother        MVA, deceased 31s   Healthy Daughter    Healthy Son    Rectal cancer Neg Hx    Stomach cancer Neg Hx    Social History   Socioeconomic History   Marital status: Married    Spouse name: Not on file   Number of children: Not on file   Years of education: Not on file   Highest education level: Not on file  Occupational History    Employer: [employer]  Tobacco Use   Smoking status: Former    Current packs/day: 0.00    Average packs/day: 2.0 packs/day for 31.0 years (62.0 ttl pk-yrs)    Types: Cigarettes    Start date: 58    Quit date: 10/23/1982     Years since quitting: 40.7   Smokeless tobacco: Never  Vaping Use   Vaping status: Never Used  Substance and Sexual Activity   Alcohol use: No    Alcohol/week: 0.0 standard drinks of alcohol   Drug use: No   Sexual activity: Not on file  Other Topics Concern   Not on file  Social History Narrative   Lives with wife in a one story home.  Has a son and a daughter.     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     Education: 2 years of college.   Social Determinants of Health   Financial Resource Strain: Low Risk  (01/26/2023)   Overall Financial Resource Strain (CARDIA)    Difficulty of Paying Living Expenses: Not hard at all  Food Insecurity: No Food Insecurity (01/26/2023)   Hunger Vital Sign    Worried About Running Out of Food in the Last Year: Never true    Ran Out of Food in the Last Year: Never true  Transportation Needs: No Transportation Needs (01/26/2023)   PRAPARE - Administrator, Civil Service (Medical): No    Lack of Transportation (Non-Medical): No  Physical Activity: Inactive (01/26/2023)   Exercise Vital Sign    Days of Exercise per Week: 0 days    Minutes of Exercise per Session: 0 min  Stress: No Stress Concern Present (01/26/2023)   [employer] of Occupational Health - Occupational Stress Questionnaire    Feeling of Stress : Not at all  Social Connections: Socially Integrated (01/10/2022)   Social Connection and Isolation Panel [NHANES]    Frequency of Communication with Friends and Family: Twice a week    Frequency of Social Gatherings with Friends and Family: Twice a week    Attends Religious Services: 1 to 4 times per year    Active Member of [employer] or Organizations: Yes    Attends Banker Meetings: 1 to 4 times per year    Marital Status: Married   Past Surgical History:  Procedure Laterality Date   Abdominal US  09/1997   arm fracture Left 1958   with hardware   Colon cancer screening     COLONOSCOPY   08/18/2004   diverticulitis   COLONOSCOPY  09/24/2009   ELECTROCARDIOGRAM  02/2006   FLEXIBLE SIGMOIDOSCOPY  03/04/2001   polyps, anal fissure   GANGLION CYST EXCISION Left 1980   HIP PINNING,CANNULATED Right 04/25/2021   Procedure: CANNULATED HIP PINNING;  Surgeon: Samson Frederic, MD;  Location: [facility];  Service: Orthopedics;  Laterality: Right;   Lower Arterial  04/13/2004   POLYPECTOMY     Rest Cardiolite  03/19/2003   Past Medical History:  Diagnosis Date   Allergy    Anal fissure    Anemia, unspecified    Arthritis    Spinal OA   BACK PAIN, LUMBAR 10/23/2007   CARDIAC MURMUR, SYSTOLIC 10/27/2008   DIABETES MELLITUS, TYPE I 05/20/2007   DIASTOLIC DYSFUNCTION 11/04/2008   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HCC)    Duodenitis    Edema 05/12/2008   Esophageal stricture    GERD (gastroesophageal reflux disease)    GOUT 05/20/2007   Gynecomastia    Hepatic steatosis    HYPERLIPIDEMIA 05/20/2007   Hypertension    Hypogonadism male    Morbid obesity (HCC) 09/10/2009   OBSTRUCTIVE SLEEP APNEA 11/04/2007   Personal history of colonic polyps    RENAL INSUFFICIENCY 05/12/2008   Sleep apnea    uses cpap   Squamous cell carcinoma of skin 02/16/2020   left lower leg, anterior-cx3,37fu   Urolithiasis    BP (!) 156/85   Pulse 79   Ht 6\' 1"  (1.854 m)   Wt 268 lb (121.6 kg)   SpO2 97%   BMI 35.36 kg/m   Opioid Risk Score:   Fall Risk Score:  `1  Depression screen [facility] 2/9     06/26/2023    2:38 PM 05/23/2023    9:24 AM 03/21/2023    8:40 AM 01/26/2023    2:18 PM 01/18/2023   10:17 AM 11/21/2022   10:42 AM 09/19/2022   11:45 AM  Depression screen PHQ 2/9  Decreased Interest 0 0 0 0 0 0 0  Down, Depressed, Hopeless 0 0 0 0 0 0 0  PHQ - 2 Score 0 0 0 0 0 0 0    Review of Systems  HENT: Negative.    Eyes: Negative.   Respiratory:  Positive for apnea and shortness of breath.   Cardiovascular: Negative.   Gastrointestinal: Negative.   Endocrine:       Diabetic High  blood sugar  Genitourinary: Negative.           Musculoskeletal:  Positive for arthralgias, back pain, gait problem, myalgias and neck pain.  Skin: Negative.   Allergic/Immunologic: Negative.   Neurological:  Positive for weakness and numbness.       Tingling  Hematological: Negative.   All other systems reviewed and are negative.     Objective:   Physical Exam  Constitutional: No distress . Vital signs reviewed. BMI 34.70, weight 263 lbs, 120/72 Gen: no distress, normal appearing HEENT: oral mucosa pink and moist, NCAT Cardio: Reg rate Chest: normal effort, normal rate of breathing Abd: soft, non-distended Ext: no edema Psych: pleasant, normal affect Skin: intact Musc: TTP lumbar spinous process Musc: Gait: Mildly antalgic. Ambulating with cane at all times.  Neuro: Alert  HOH             Strength          5/5 in all LE myotomes Decreased sensation in bilateral feet Pain is worst with walking    Assessment & Plan:  Male with pmh of OSA, CKD, HTN, DM type 1 with peripheral neuropathy presents for follow up of low back pain > neck pain.     1. Chronic mechanical low back pain             MRIs C,L-spine early 2016 suggesting multilevel spondylosis with mild b/l  multilevel foraminal narrowing C4-C7 and shallow disc bulge at L5-S1 without central canal or foraminal stenosis.   Discussed physical therapy  Discussed prednisone  Gabapentin 300mg  TID PRN prescribed  Discussed core strengthening exercises             Avoid NSAIDs due to CKD, discussed with patient since he takes Advil  Continue voltaren gel, advised that he could use up to 4 times per day.              Unable to tolerate Gabapentin, Lyrica             Cont HEP  Discussed repeat MIR of lumbar spine  Continue TENS             Aquatic therapy, not going anymore, states ineffective             Cont tylenol  Robaxin 500 TID PRN, changed to Baclofen 10 TID PRN due to change insurance, continue   Continue Cymbalta  60mg              Cont back brace during periods of excessive activity, reminded to avoid prolonged use  Continue Lidoderm OTC, reminded              Encouraged rest breaks             Acknowledges he can do more if he make a conscious effort             PT completed    Pain over spinous process where clothing is resulting increased pressure.  Encourage change in clothing.   Sprint PNS removed today.   -Provided with a pain relief journal and discussed that it contains foods and lifestyle tips to naturally help to improve pain. Discussed that these lifestyle strategies are also very good for health unlike some medications which can have negative side effects. Discussed that the act of keeping a journal can be therapeutic and helpful to realize patterns what helps to trigger and alleviate pain.  -Discussed Qutenza as an option for neuropathic pain control. Discussed that this is a capsaicin patch, stronger than capsaicin cream. Discussed that it is currently approved for diabetic peripheral neuropathy and post-herpetic neuralgia, but that it has also shown benefit in treating other forms of neuropathy. Provided patient with link to site to learn more about the patch: https://www.clark.biz/. Discussed that the patch would be placed in office and benefits usually last 3 months. Discussed that unintended exposure to capsaicin can cause severe irritation of eyes, mucous membranes, respiratory tract, and skin, but that Qutenza is a local treatment and does not have the systemic side effects of other nerve medications. Discussed that there may be pain, itching, erythema, and decreased sensory function associated with the application of Qutenza. Side effects usually subside within 1 week. A cold pack of analgesic medications can help with these side effects. Blood pressure can also be increased due to pain associated with administration of the patch.   2. Sacroiliitis             See #1             Continue  brace             Not main pain generator at present, does not wish to proceed with injection at this time, particularly due to DM   3. Sleep disturbance             Recently new mattress  Continue gabapentin 300mg  TID PRN  Continue CPAP             Improved  Insomnia: -Try to go outside near sunrise -Get exercise during the day.  -Turn off all devices an hour before bedtime.  -Teas that can benefit: chamomile, valerian root, Brahmi (Bacopa) -Can consider over the counter melatonin, magnesium, and/or L-theanine. Melatonin is an anti-oxidant with multiple health benefits. Magnesium is involved in greater than 300 enzymatic reactions in the body and most of [place] are deficient as our soil is often depleted. There are 7 different types of magnesium- Bioptemizer's is a supplement with all 7 types, and each has unique benefits. Magnesium can also help with constipation and anxiety.  -Pistachios naturally increase the production of melatonin -Cozy Earth bamboo bed sheets are free from toxic chemicals.  -Tart cherry juice or a tart cherry supplement can improve sleep and soreness post-workout     4. Myalgia  Will schedule for trigger point injections   5. Diabetic neuropathy             See #1, #3            Continue elavil to 50mg  qhs, continue 25mg  educated on signs/symptoms of seratonin syndrome previously  Provided dietary education  Refilled Cymbalta.   Recommended no snacking after dinner -Discussed current symptoms of pain and history of pain.  -Discussed benefits of exercise in reducing pain. -Discussed following foods that may reduce pain: 1) Ginger (especially studied for arthritis)- reduce leukotriene production to decrease inflammation 2) Blueberries- high in phytonutrients that decrease inflammation 3) Salmon- marine omega-3s reduce joint swelling and pain 4) Pumpkin seeds- reduce inflammation 5) dark chocolate- reduces inflammation 6) turmeric- reduces  inflammation 7) tart cherries - reduce pain and stiffness 8) extra virgin olive oil - its compound olecanthal helps to block prostaglandins  9) chili peppers- can be eaten or applied topically via capsaicin 10) mint- helpful for headache, muscle aches, joint pain, and itching 11) garlic- reduces inflammation  Link to further information on diet for chronic pain: http://www.bray.com/   --Discussed Qutenza as an option for neuropathic pain control. Discussed that this is a capsaicin patch, stronger than capsaicin cream. Discussed that it is currently approved for diabetic peripheral neuropathy and post-herpetic neuralgia, but that it has also shown benefit in treating other forms of neuropathy. Provided patient with link to site to learn more about the patch: https://www.clark.biz/. Discussed that the patch would be placed in office and benefits usually last 3 months. Discussed that unintended exposure to capsaicin can cause severe irritation of eyes, mucous membranes, respiratory tract, and skin, but that Qutenza is a local treatment and does not have the systemic side effects of other nerve medications. Discussed that there may be pain, itching, erythema, and decreased sensory function associated with the application of Qutenza. Side effects usually subside within 1 week. A cold pack of analgesic medications can help with these side effects. Blood pressure can also be increased due to pain associated with administration of the patch.    6. Morbid obesity             Not interested in seeing dietitian at this time  Encouraged activity   No attempts at weight loss  Discussed Topamax.   Commended on improvement!   7. Gait abnormality             Completed therapies, cont HEP  Continue cane for safety   8. Nonhealing ulcer  Left lateral foot, improving  9. Lightheadedness  Encouraged BP monitoring at home  10.  Knee pain: -recommended applying voltaren gel  11. Chronic gout: continue allopurinol, recommended to avoid fructose

## 2023-06-26 NOTE — Progress Notes (Signed)
Subjective:    Patient ID: [name], male    DOB: 19-Sep-1941, 82 y.o.   MRN: [id number]   HPI  1) Left sided sciatica: -did not topamax because of potential side effect -is not taken gabapentin but would be interested in this -the pain radiates down his left leg to past his left knee -pain ranges from 3-7 -has a back brace but does not like to use it  2) Knee buckling -hurt severely -had surgery for torn meniscus -his surgeon said his meniscus ripped in two -he walked out from the surgery  Male with pmh of OSA, CKD, HTN, DM type 1 who presents for f/u of diabetic peripheral neuropathy and low back pain > neck pain.   On first visit, pt complained of b/l L>R back pain.  Started ~>20 years ago.  No inciting event.  Sitting/laying improve the pain.  Standing exacerbates the pain.  Sharp, burning pain.  Radiates to left posterior thigh.  Intermittent.  He has associated numbness/tingling.  He only tried ASA, which helps some.  Pain limits from doing ADLs and fishing.  He denies falls. Back pain has been ok especially if sitting down Can't stand more than 5 or 10 minutes Tried PT and cannot feel difference -pain is worst on left.  Last Qutenza patch helped a lot for his back but he was charged $700, [place] sent the patches to speciality pharmacy this time and they were 100% covered Qutenza patches helped on feet where he has tingling. He has less pain in his feet except for his toes which he thinks is due to gout Larey Seat since he was ere last, landed on his knees He is doing well decreasing snacking after dinner 169 CBG right now.  -he usually snacks at night on potato chips after dinner -does not eat much more baked potatoes Voltaren cream helps the back pain but does not stop it When he fishes he feels pain from the jolting- fishing made it worse -He takes advil and this helps a little.  Sometimes it moves into his legs.   Last clinic visit on 10/21/2020.  He had trigger point  injections at that time.  Since that time, patient states he had great benefit for about a week.  He uses a TENS with benefit. Patient states he feels he is getting weaker. He is taking Baclofen 1-2/week.  Sleep has improved. He is taking Elavil 25.  He states he believe he lost weight, but not exercising. Denies falls. Lightheadedness has improved.    He does not take any other medications for his pain and does not desire to. He requires a cane for ambulation and has a handicap placard.   Blood pressure is better controlled today at 120/74  He has been to multiple speciialists and was told he will not be able to see from this eye after her had a fall on this eye Has not been using SPRINT PNS but it did help when he used it.  Has been having headaches.  -he is ready to try Qutenza today -he has numbness and tingling from his shins down to his feet and it is constant and feels uncomfortable -his back pain has also been severe -wife accompanies him today -he has decreased sensation in both feet   Pain Inventory Average Pain 4 Pain Right Now 0 My pain is intermittent, sharp, and tingling  In the last 24 hours, has pain interfered with the following? General activity 3 Relation with others 0  Enjoyment of life 3 What TIME of day is your pain at its worst? daytime Sleep (in general) Fair  Pain is worse with: walking and standing Pain improves with: heat/ice Relief from Meds: 0     Family History  Problem Relation Age of Onset   Colon cancer Brother 67   Colon polyps Brother    Lung cancer Brother    Other Mother        MVA, deceased 25s   Healthy Daughter    Healthy Son    Rectal cancer Neg Hx    Stomach cancer Neg Hx    Social History   Socioeconomic History   Marital status: Married    Spouse name: Not on file   Number of children: Not on file   Years of education: Not on file   Highest education level: Not on file  Occupational History    Employer: [employer]  Tobacco Use   Smoking status: Former    Current packs/day: 0.00    Average packs/day: 2.0 packs/day for 31.0 years (62.0 ttl pk-yrs)    Types: Cigarettes    Start date: 70    Quit date: 10/23/1982    Years since quitting: 40.7   Smokeless tobacco: Never  Vaping Use   Vaping status: Never Used  Substance and Sexual Activity   Alcohol use: No    Alcohol/week: 0.0 standard drinks of alcohol   Drug use: No   Sexual activity: Not on file  Other Topics Concern   Not on file  Social History Narrative   Lives with wife in a one story home.  Has a son and a daughter.     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     Education: 2 years of college.   Social Determinants of Health   Financial Resource Strain: Low Risk  (01/26/2023)   Overall Financial Resource Strain (CARDIA)    Difficulty of Paying Living Expenses: Not hard at all  Food Insecurity: No Food Insecurity (01/26/2023)   Hunger Vital Sign    Worried About Running Out of Food in the Last Year: Never true    Ran Out of Food in the Last Year: Never true  Transportation Needs: No Transportation Needs (01/26/2023)   PRAPARE - Administrator, Civil Service (Medical): No    Lack of Transportation (Non-Medical): No  Physical Activity: Inactive (01/26/2023)   Exercise Vital Sign    Days of Exercise per Week: 0 days    Minutes of Exercise per Session: 0 min  Stress: No Stress Concern Present (01/26/2023)   [employer] of Occupational Health - Occupational Stress Questionnaire    Feeling of Stress : Not at all  Social Connections: Socially Integrated (01/10/2022)   Social Connection and Isolation Panel [NHANES]    Frequency of Communication with Friends and Family: Twice a week    Frequency of Social Gatherings with Friends and Family: Twice a week    Attends Religious Services: 1 to 4 times per year    Active Member of [employer] or Organizations: Yes    Attends Banker Meetings: 1 to 4  times per year    Marital Status: Married   Past Surgical History:  Procedure Laterality Date   Abdominal US  09/1997   arm fracture Left 1958   with hardware   Colon cancer screening     COLONOSCOPY  08/18/2004   diverticulitis   COLONOSCOPY  09/24/2009   ELECTROCARDIOGRAM  02/2006  FLEXIBLE SIGMOIDOSCOPY  03/04/2001   polyps, anal fissure   GANGLION CYST EXCISION Left 1980   HIP PINNING,CANNULATED Right 04/25/2021   Procedure: CANNULATED HIP PINNING;  Surgeon: Samson Frederic, MD;  Location: [facility];  Service: Orthopedics;  Laterality: Right;   Lower Arterial  04/13/2004   POLYPECTOMY     Rest Cardiolite  03/19/2003   Past Medical History:  Diagnosis Date   Allergy    Anal fissure    Anemia, unspecified    Arthritis    Spinal OA   BACK PAIN, LUMBAR 10/23/2007   CARDIAC MURMUR, SYSTOLIC 10/27/2008   DIABETES MELLITUS, TYPE I 05/20/2007   DIASTOLIC DYSFUNCTION 11/04/2008   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HCC)    Duodenitis    Edema 05/12/2008   Esophageal stricture    GERD (gastroesophageal reflux disease)    GOUT 05/20/2007   Gynecomastia    Hepatic steatosis    HYPERLIPIDEMIA 05/20/2007   Hypertension    Hypogonadism male    Morbid obesity (HCC) 09/10/2009   OBSTRUCTIVE SLEEP APNEA 11/04/2007   Personal history of colonic polyps    RENAL INSUFFICIENCY 05/12/2008   Sleep apnea    uses cpap   Squamous cell carcinoma of skin 02/16/2020   left lower leg, anterior-cx3,49fu   Urolithiasis    There were no vitals taken for this visit.  Opioid Risk Score:   Fall Risk Score:  `1  Depression screen [facility] 2/9     05/23/2023    9:24 AM 03/21/2023    8:40 AM 01/26/2023    2:18 PM 01/18/2023   10:17 AM 11/21/2022   10:42 AM 09/19/2022   11:45 AM 05/24/2022   10:30 AM  Depression screen PHQ 2/9  Decreased Interest 0 0 0 0 0 0 0  Down, Depressed, Hopeless 0 0 0 0 0 0 0  PHQ - 2 Score 0 0 0 0 0 0 0    Review of Systems  HENT: Negative.    Eyes: Negative.    Respiratory:  Positive for apnea and shortness of breath.   Cardiovascular: Negative.   Gastrointestinal: Negative.   Endocrine:       Diabetic High blood sugar  Genitourinary: Negative.           Musculoskeletal:  Positive for arthralgias, back pain, gait problem, myalgias and neck pain.  Skin: Negative.   Allergic/Immunologic: Negative.   Neurological:  Positive for weakness and numbness.       Tingling  Hematological: Negative.   All other systems reviewed and are negative.     Objective:   Physical Exam  Constitutional: No distress . Vital signs reviewed. BMI 35.36, weight 263 lbs, 156/85 Gen: no distress, normal appearing HEENT: oral mucosa pink and moist, NCAT Cardio: Reg rate Chest: normal effort, normal rate of breathing Abd: soft, non-distended Ext: no edema Psych: pleasant, normal affect Skin: intact Musc: TTP lumbar spinous process Musc: Gait: Mildly antalgic. Ambulating with cane at all times.  Neuro: Alert  HOH             Strength          5/5 in all LE myotomes Decreased sensation in bilateral feet Pain is worst with walking    Assessment & Plan:  Male with pmh of OSA, CKD, HTN, DM type 1 with peripheral neuropathy presents for follow up of low back pain > neck pain.     1. Chronic mechanical low back pain  MRIs C,L-spine early 2016 suggesting multilevel spondylosis with mild b/l multilevel foraminal narrowing C4-C7 and shallow disc bulge at L5-S1 without central canal or foraminal stenosis.   Discussed physical therapy  Discussed prednisone  Gabapentin 300mg  TID PRN prescribed  -discussed medial branch blocks  Discussed core strengthening exercises             Avoid NSAIDs due to CKD, discussed with patient since he takes Advil  Continue voltaren gel, advised that he could use up to 4 times per day.              Unable to tolerate Gabapentin, Lyrica             Cont HEP  Discussed steroid injections but they do not seem to  help  Discussed repeat MIR of lumbar spine  Continue TENS             Aquatic therapy, not going anymore, states ineffective             Cont tylenol  Robaxin 500 TID PRN, changed to Baclofen 10 TID PRN due to change insurance, continue   Continue Cymbalta 60mg              Cont back brace during periods of excessive activity, reminded to avoid prolonged use  Continue Lidoderm OTC, reminded              Encouraged rest breaks             Acknowledges he can do more if he make a conscious effort             PT completed    Pain over spinous process where clothing is resulting increased pressure.  Encourage change in clothing.   Sprint PNS removed today.   -Provided with a pain relief journal and discussed that it contains foods and lifestyle tips to naturally help to improve pain. Discussed that these lifestyle strategies are also very good for health unlike some medications which can have negative side effects. Discussed that the act of keeping a journal can be therapeutic and helpful to realize patterns what helps to trigger and alleviate pain.  -Discussed Qutenza as an option for neuropathic pain control. Discussed that this is a capsaicin patch, stronger than capsaicin cream. Discussed that it is currently approved for diabetic peripheral neuropathy and post-herpetic neuralgia, but that it has also shown benefit in treating other forms of neuropathy. Provided patient with link to site to learn more about the patch: https://www.clark.biz/. Discussed that the patch would be placed in office and benefits usually last 3 months. Discussed that unintended exposure to capsaicin can cause severe irritation of eyes, mucous membranes, respiratory tract, and skin, but that Qutenza is a local treatment and does not have the systemic side effects of other nerve medications. Discussed that there may be pain, itching, erythema, and decreased sensory function associated with the application of Qutenza. Side  effects usually subside within 1 week. A cold pack of analgesic medications can help with these side effects. Blood pressure can also be increased due to pain associated with administration of the patch.   2. Sacroiliitis             See #1             Continue brace             Not main pain generator at present, does not wish to proceed with injection at this time, particularly due to DM  3. Sleep disturbance             Recently new mattress  Continue gabapentin 300mg  TID PRN             Continue CPAP             Improved  Insomnia: -Try to go outside near sunrise -Get exercise during the day.  -Turn off all devices an hour before bedtime.  -Teas that can benefit: chamomile, valerian root, Brahmi (Bacopa) -Can consider over the counter melatonin, magnesium, and/or L-theanine. Melatonin is an anti-oxidant with multiple health benefits. Magnesium is involved in greater than 300 enzymatic reactions in the body and most of [place] are deficient as our soil is often depleted. There are 7 different types of magnesium- Bioptemizer's is a supplement with all 7 types, and each has unique benefits. Magnesium can also help with constipation and anxiety.  -Pistachios naturally increase the production of melatonin -Cozy Earth bamboo bed sheets are free from toxic chemicals.  -Tart cherry juice or a tart cherry supplement can improve sleep and soreness post-workout     4. Myalgia  Will schedule for trigger point injections   5. Diabetic neuropathy             See #1, #3            Continue elavil to 50mg  qhs, continue 25mg  educated on signs/symptoms of seratonin syndrome previously  Provided dietary education  Refilled Cymbalta.   -Discussed Qutenza as an option for neuropathic pain control. Discussed that this is a capsaicin patch, stronger than capsaicin cream. Discussed that it is currently approved for diabetic peripheral neuropathy and post-herpetic neuralgia, but that it has also shown benefit  in treating other forms of neuropathy. Provided patient with link to site to learn more about the patch: https://www.clark.biz/. Discussed that the patch would be placed in office and benefits usually last 3 months. Discussed that unintended exposure to capsaicin can cause severe irritation of eyes, mucous membranes, respiratory tract, and skin, but that Qutenza is a local treatment and does not have the systemic side effects of other nerve medications. Discussed that there may be pain, itching, erythema, and decreased sensory function associated with the application of Qutenza. Side effects usually subside within 1 week. A cold pack of analgesic medications can help with these side effects. Blood pressure can also be increased due to pain associated with administration of the patch.   Recommended no snacking after dinner -Discussed current symptoms of pain and history of pain.  -Discussed benefits of exercise in reducing pain. -Discussed following foods that may reduce pain: 1) Ginger (especially studied for arthritis)- reduce leukotriene production to decrease inflammation 2) Blueberries- high in phytonutrients that decrease inflammation 3) Salmon- marine omega-3s reduce joint swelling and pain 4) Pumpkin seeds- reduce inflammation 5) dark chocolate- reduces inflammation 6) turmeric- reduces inflammation 7) tart cherries - reduce pain and stiffness 8) extra virgin olive oil - its compound olecanthal helps to block prostaglandins  9) chili peppers- can be eaten or applied topically via capsaicin 10) mint- helpful for headache, muscle aches, joint pain, and itching 11) garlic- reduces inflammation  Link to further information on diet for chronic pain: http://www.bray.com/   --Discussed Qutenza as an option for neuropathic pain control. Discussed that this is a capsaicin patch, stronger than capsaicin cream. Discussed that it  is currently approved for diabetic peripheral neuropathy and post-herpetic neuralgia, but that it has also shown benefit in treating other forms of neuropathy. Provided patient  with link to site to learn more about the patch: https://www.clark.biz/. Discussed that the patch would be placed in office and benefits usually last 3 months. Discussed that unintended exposure to capsaicin can cause severe irritation of eyes, mucous membranes, respiratory tract, and skin, but that Qutenza is a local treatment and does not have the systemic side effects of other nerve medications. Discussed that there may be pain, itching, erythema, and decreased sensory function associated with the application of Qutenza. Side effects usually subside within 1 week. A cold pack of analgesic medications can help with these side effects. Blood pressure can also be increased due to pain associated with administration of the patch.   -discussed lidocaine patches can be applied on the feet as well  -discussed that he is no longer having pain, just the tingling   6. Morbid obesity             Not interested in seeing dietitian at this time  Encouraged activity   No attempts at weight loss  Discussed Topamax.   Commended on improvement!   7. Gait abnormality             Completed therapies, cont HEP  Continue cane for safety   8. Skin cancer of left lateral foot: discussed that this was skin cancer and it was removed  9. Lightheadedness  Encouraged BP monitoring at home  10. Knee pain: -recommended applying voltaren gel -discussed that ne needed meniscal surgery  11. Chronic gout: continue allopurinol, recommended to avoid fructose

## 2023-07-20 ENCOUNTER — Telehealth (INDEPENDENT_AMBULATORY_CARE_PROVIDER_SITE_OTHER): Payer: Medicare Other | Admitting: "Endocrinology

## 2023-07-20 ENCOUNTER — Encounter: Payer: Self-pay | Admitting: "Endocrinology

## 2023-07-20 DIAGNOSIS — E1165 Type 2 diabetes mellitus with hyperglycemia: Secondary | ICD-10-CM | POA: Diagnosis not present

## 2023-07-20 DIAGNOSIS — Z9641 Presence of insulin pump (external) (internal): Secondary | ICD-10-CM

## 2023-07-20 DIAGNOSIS — E782 Mixed hyperlipidemia: Secondary | ICD-10-CM

## 2023-07-20 NOTE — Progress Notes (Signed)
The patient reports they are currently: [place]. I spent 7-8 minutes on the video with the patient on the date of service. I spent an additional 15 minutes on pre- and post-visit activities on the date of service.   The patient was physically located in [place] or a state in which I am permitted to provide care. The patient and/or parent/guardian understood that s/he may incur co-pays and cost sharing, and agreed to the telemedicine visit. The visit was reasonable and appropriate under the circumstances given the patient's presentation at the time.  The patient and/or parent/guardian has been advised of the potential risks and limitations of this mode of treatment (including, but not limited to, the absence of in-person examination) and has agreed to be treated using telemedicine. The patient's/patient's family's questions regarding telemedicine have been answered.   The patient and/or parent/guardian has also been advised to contact their provider's office for worsening conditions, and seek emergency medical treatment and/or call 911 if the patient deems either necessary.      [facility] Note  [name] [birth year] [id number]  Referring Provider: @REFPROVFULL @ Primary Care Provider: Mliss Sax, MD Reason for consultation: No chief complaint on file.   Assessment & Plan Diagnoses and all orders for this visit:  Uncontrolled type 2 diabetes mellitus with hyperglycemia (HCC) -     Hemoglobin A1c; Future  Insulin pump in place  Mixed hypercholesterolemia and hypertriglyceridemia     1.Type 2 diabetes mellitus Hba1c goal less than 7.0, current Hba1c is 7. Will recommend:   Insulin Pump Settings: recommend as follows: Insulin pump: T-slim control IQ, DexCom G6  BASAL settings: 12-8 am = 1.65 8-12 noon= 1.8 Noon to 12 MN = 1.75  Carb 1:3  correction factor I: 25>100 Active insulin 3 hours  Start bolusing 5 min before each meal  A1C due  -check  blood sugars before each meals and at bedtime -enter blood sugars in the pump and calibrate three times a day. Calibrate when fasting and at bedtime and one calibration before the meal -avoid calibrations when the blood sugar is rising rapidly or falling -pre bolus carbs when eating. Pre bolus needs to be 5 minutes before the meal -do not enter false or fake carbohydrates -do not enter carbohydratess after you eat and try to catch up with a high blood sugar -change sets on time to have consistent insulin absorption and prevent scar formation -call the office /answering service for blood sugar questions regarding hyper or hypoglycemia or sensor and transmitter issues -insulin settings updated and scanned in chart  No known contraindications to any of above medications  Hyperlipidemia -Last LDL off goal: 127 -on rosuvastatin 40 mg every day and repatha 140 mg q14 days -Follow low fat diet and exercise, pt likes fried foods   -Blood pressure goal <140/90 - Microalbumin/creatinine goal < 30 -was on losartan 25 mg every day, taken off? -diet changes including salt restriction -limit eating outside -counseled BP targets per standards of diabetes care -Uncontrolled blood pressure can lead to retinopathy, nephropathy and cardiovascular and atherosclerotic heart disease  Reviewed and counseled on: -A1C target -Blood sugar targets -Complications of uncontrolled diabetes  -Checking blood sugar before meals and bedtime and bring log next visit -All medications with mechanism of action and side effects -Hypoglycemia management: rule of 15's, Glucagon Emergency Kit and medical alert ID -low-carb low-fat plate-method diet -At least 20 minutes of physical activity per day -Annual dilated retinal eye exam and foot exam -compliance and follow  up needs -follow up as scheduled or earlier if problem gets worse  Call if blood sugar is less than 70 or consistently above 250    Take a 15 gm snack of  carbohydrate at bedtime before you go to sleep if your blood sugar is less than 100.    If you are going to fast after midnight for a test or procedure, ask your physician for instructions on how to reduce/decrease your insulin dose.    Call if blood sugar is less than 70 or consistently above 250  -Treating a low sugar by rule of 15  (15 gms of sugar every 15 min until sugar is more than 70) If you feel your sugar is low, test your sugar to be sure If your sugar is low (less than 70), then take 15 grams of a fast acting Carbohydrate (3-4 glucose tablets or glucose gel or 4 ounces of juice or regular soda) Recheck your sugar 15 min after treating low to make sure it is more than 70 If sugar is still less than 70, treat again with 15 grams of carbohydrate          Don't drive the hour of hypoglycemia  If unconscious/unable to eat or drink by mouth, use glucagon injection or nasal spray baqsimi and call 911. Can repeat again in 15 min if still unconscious.    I have reviewed current medications, nurse's notes, allergies, vital signs, past medical and surgical history, family medical history, and social history for this encounter. Counseled patient on symptoms, examination findings, lab findings, imaging results, treatment decisions and monitoring and prognosis. The patient understood the recommendations and agrees with the treatment plan. All questions regarding treatment plan were fully answered.  Return in about 4 weeks (around 08/17/2023).   Altamese Hamilton, MD 07/20/23    History of Present Illness [name] is a 82 y.o. year old male who presents to our clinic for a new referral for Type 2 diabetes mellitus. [name] was first diagnosed in 1986.   Previous history: Insulin was started in 1995  Recent history:     Non-insulin hypoglycemic drugs: None     Insulin pump: T-slim control IQ       BASAL settings: 12-8 am = 1.65 8-12 noon= 1.8 Noon to 12 MN = 1.75  Carb 1:4  dinner Carb ratio 1:3 breakfast, lunch  Active insulin 3 hours, correction factor I: 40>100   Ambulatory BG Monitoring     CGM interpretation: At today's visit, we reviewed her CGM downloads. The full report is scanned in the media. Reviewing the CGM trends, Bg trend high after meals    Insulin pump  Pump downloaded and reviewed. Blood sugars reviewed Basal rates reviewed Auto mode % reviewed Manual mode % reviewed Sensor wear % reviewed Readings above target % reviewed Readings at target % reviewed Readings below target % reviewed TDD units of insulin /day reviewed Changing sets on time reviewed Calibration reviewed Carb counting reviewed Basal bolus ratio reviewed Basal % reviewed Bolus % reviewed Duration of pump suspends reviewed No reaction at insulin pump sites Set change=every 3 days Reservoir change = every 3 days   Physical Exam  There were no vitals taken for this visit.   Constitutional: well developed, well nourished Head: normocephalic, atraumatic Eyes: sclera anicteric, no redness Neck: supple Lungs: normal respiratory effort Neurology: alert and oriented Skin: dry, no appreciable rashes Musculoskeletal: no appreciable defects Psychiatric: normal mood and affect  Current Medications Patient's Medications  New Prescriptions   No medications on file  Previous Medications   ACCU-CHEK FASTCLIX LANCETS MISC    USE TO CHECK BLOOD SUGAR UP TO 6 TIMES DAILY   ALFUZOSIN (UROXATRAL) 10 MG 24 HR TABLET    TAKE 1 TABLET BY MOUTH  DAILY WITH BREAKFAST   AMITRIPTYLINE (ELAVIL) 50 MG TABLET    TAKE ONE-HALF TABLET BY MOUTH AT BEDTIME   AMLODIPINE (NORVASC) 2.5 MG TABLET    Take 1 tablet (2.5 mg total) by mouth daily.   BLOOD GLUCOSE MONITORING SUPPL (ACCU-CHEK AVIVA PLUS) W/DEVICE KIT    Use as directed   CETIRIZINE (ZYRTEC) 10 MG TABLET    Take 10 mg by mouth daily as needed for allergies.   CONTINUOUS BLOOD GLUC SENSOR (DEXCOM G6 SENSOR) MISC    Inject 1  Device into the skin as directed. Apply to skin SQ every 10 days   DICLOFENAC SODIUM (VOLTAREN) 1 % GEL    Apply 2 g topically 4 (four) times daily.   EVOLOCUMAB (REPATHA SURECLICK) 140 MG/ML SOAJ    Inject 140 mg into the skin every 14 (fourteen) days.   FLUTICASONE (FLONASE) 50 MCG/ACT NASAL SPRAY    Place 1 spray into the nose daily as needed for allergies.    FOLIC ACID (FOLVITE) 1 MG TABLET    Take 1 mg by mouth daily.   FUROSEMIDE (LASIX) 20 MG TABLET    TAKE 1 TABLET BY MOUTH  DAILY   GABAPENTIN (NEURONTIN) 300 MG CAPSULE    Take 1 capsule (300 mg total) by mouth 3 (three) times daily.   GLUCOSE BLOOD (ACCU-CHEK AVIVA PLUS) TEST STRIP    USE TO TEST BLOOD SUGAR 6  TIMES PER DAY; E11.9   INSULIN INFUSION PUMP SUPPLIES (AUTOSOFT XC INFUSION SET) MISC    Inject 1 Act into the skin every other day. Use with 9mm cannula and 23 inch tubing. *5 boxes (90 day supply)   INSULIN INFUSION PUMP SUPPLIES (T:SLIM INSULIN CARTRIDGE ) MISC    Use with insulin pump, fill once every 2 days.   INSULIN LISPRO (HUMALOG) 100 UNIT/ML INJECTION    INJECT SUBCUTANEOUSLY VIA  INSULIN PUMP TOTAL OF 120 UNITS  DAILY   LIDOCAINE (LIDODERM) 5 %    Place 1 patch onto the skin daily. Remove & Discard patch within 12 hours or as directed by MD   MULTIPLE VITAMINS-MINERALS (CENTRUM SILVER PO)    Take 1 tablet by mouth daily.   OMEGA-3 FATTY ACIDS (FISH OIL) 1200 MG CAPS    Take 1,200 mg by mouth daily.   ROSUVASTATIN (CRESTOR) 40 MG TABLET    TAKE 1 TABLET BY MOUTH AT  BEDTIME   VITAMIN B-12 (CYANOCOBALAMIN) 1000 MCG TABLET    Take 1,000 mcg by mouth daily.  Modified Medications   No medications on file  Discontinued Medications   No medications on file    Allergies Allergies  Allergen Reactions   Atorvastatin Calcium    Lipitor [Atorvastatin] Other (See Comments)    unknown   Niacin     REACTION: Severe heartburn   Pioglitazone     REACTION: Edema    Past Medical History Past Medical History:  Diagnosis  Date   Allergy    Anal fissure    Anemia, unspecified    Arthritis    Spinal OA   BACK PAIN, LUMBAR 10/23/2007   CARDIAC MURMUR, SYSTOLIC 10/27/2008   DIABETES MELLITUS, TYPE I 05/20/2007   DIASTOLIC DYSFUNCTION 11/04/2008   DM type 1  with diabetic peripheral neuropathy (HCC)    Duodenitis    Edema 05/12/2008   Esophageal stricture    GERD (gastroesophageal reflux disease)    GOUT 05/20/2007   Gynecomastia    Hepatic steatosis    HYPERLIPIDEMIA 05/20/2007   Hypertension    Hypogonadism male    Morbid obesity (HCC) 09/10/2009   OBSTRUCTIVE SLEEP APNEA 11/04/2007   Personal history of colonic polyps    RENAL INSUFFICIENCY 05/12/2008   Sleep apnea    uses cpap   Squamous cell carcinoma of skin 02/16/2020   left lower leg, anterior-cx3,57fu   Urolithiasis     Past Surgical History Past Surgical History:  Procedure Laterality Date   Abdominal US  09/1997   arm fracture Left 1958   with hardware   Colon cancer screening     COLONOSCOPY  08/18/2004   diverticulitis   COLONOSCOPY  09/24/2009   ELECTROCARDIOGRAM  02/2006   FLEXIBLE SIGMOIDOSCOPY  03/04/2001   polyps, anal fissure   GANGLION CYST EXCISION Left 1980   HIP PINNING,CANNULATED Right 04/25/2021   Procedure: CANNULATED HIP PINNING;  Surgeon: Samson Frederic, MD;  Location: [facility];  Service: Orthopedics;  Laterality: Right;   Lower Arterial  04/13/2004   POLYPECTOMY     Rest Cardiolite  03/19/2003    Family History family history includes Colon cancer (age of onset: 46) in his brother; Colon polyps in his brother; Healthy in his daughter and son; Lung cancer in his brother; Other in his mother.  Social History Social History   Socioeconomic History   Marital status: Married    Spouse name: Not on file   Number of children: Not on file   Years of education: Not on file   Highest education level: Not on file  Occupational History    Employer: [employer]  Tobacco Use   Smoking status: Former    Current  packs/day: 0.00    Average packs/day: 2.0 packs/day for 31.0 years (62.0 ttl pk-yrs)    Types: Cigarettes    Start date: 46    Quit date: 10/23/1982    Years since quitting: 40.7   Smokeless tobacco: Never  Vaping Use   Vaping status: Never Used  Substance and Sexual Activity   Alcohol use: No    Alcohol/week: 0.0 standard drinks of alcohol   Drug use: No   Sexual activity: Not on file  Other Topics Concern   Not on file  Social History Narrative   Lives with wife in a one story home.  Has a son and a daughter.     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     Education: 2 years of college.   Social Determinants of Health   Financial Resource Strain: Low Risk  (01/26/2023)   Overall Financial Resource Strain (CARDIA)    Difficulty of Paying Living Expenses: Not hard at all  Food Insecurity: No Food Insecurity (01/26/2023)   Hunger Vital Sign    Worried About Running Out of Food in the Last Year: Never true    Ran Out of Food in the Last Year: Never true  Transportation Needs: No Transportation Needs (01/26/2023)   PRAPARE - Administrator, Civil Service (Medical): No    Lack of Transportation (Non-Medical): No  Physical Activity: Inactive (01/26/2023)   Exercise Vital Sign    Days of Exercise per Week: 0 days    Minutes of Exercise per Session: 0 min  Stress: No Stress Concern Present (01/26/2023)  [name] [employer] Occupational [employer] - Occupational Stress Questionnaire    Feeling of Stress : Not at all  Social Connections: Socially Integrated (01/10/2022)   Social Connection and Isolation Panel [NHANES]    Frequency of Communication with Friends and Family: Twice a week    Frequency of Social Gatherings with Friends and Family: Twice a week    Attends Religious Services: 1 to 4 times per year    Active Member of [employer] or Organizations: Yes    Attends Banker Meetings: 1 to 4 times per year    Marital Status: Married  Catering manager  Violence: Not At Risk (01/10/2022)   Humiliation, Afraid, Rape, and Kick questionnaire    Fear of Current or Ex-Partner: No    Emotionally Abused: No    Physically Abused: No    Sexually Abused: No    Lab Results  Component Value Date   HGBA1C 7.0 (A) 04/04/2023   Lab Results  Component Value Date   CHOL 161 06/13/2023   Lab Results  Component Value Date   HDL 38 (L) 06/13/2023   Lab Results  Component Value Date   LDLCALC 94 06/13/2023   Lab Results  Component Value Date   TRIG 164 (H) 06/13/2023   Lab Results  Component Value Date   CHOLHDL 4.2 06/13/2023   Lab Results  Component Value Date   CREATININE 1.75 (H) 05/23/2023   Lab Results  Component Value Date   MICROALBUR 7.5 (H) 12/15/2022    Parts of this note may have been dictated using voice recognition software. There may be variances in spelling and vocabulary which are unintentional. Not all errors are proofread. Please notify the Thereasa Parkin if any discrepancies are noted or if the meaning of any statement is not clear.

## 2023-08-24 ENCOUNTER — Ambulatory Visit: Payer: Medicare Other | Admitting: "Endocrinology

## 2023-08-24 ENCOUNTER — Encounter: Payer: Self-pay | Admitting: "Endocrinology

## 2023-08-24 VITALS — BP 130/50 | HR 74 | Ht 73.0 in | Wt 269.0 lb

## 2023-08-24 DIAGNOSIS — E782 Mixed hyperlipidemia: Secondary | ICD-10-CM | POA: Diagnosis not present

## 2023-08-24 DIAGNOSIS — Z9641 Presence of insulin pump (external) (internal): Secondary | ICD-10-CM | POA: Diagnosis not present

## 2023-08-24 DIAGNOSIS — Z794 Long term (current) use of insulin: Secondary | ICD-10-CM

## 2023-08-24 DIAGNOSIS — E1165 Type 2 diabetes mellitus with hyperglycemia: Secondary | ICD-10-CM

## 2023-08-24 LAB — POCT GLYCOSYLATED HEMOGLOBIN (HGB A1C): Hemoglobin A1C: 6.9 % — AB (ref 4.0–5.6)

## 2023-08-24 NOTE — Progress Notes (Signed)
[facility] Note  [name] April 04, 82 1942 [id number]  Referring Provider: @REFPROVFULL @ Primary Care Provider: Mliss Sax, MD Reason for consultation: No chief complaint on file.   Assessment & Plan Diagnoses and all orders for this visit:  Uncontrolled type 2 diabetes mellitus with hyperglycemia (HCC) -     POCT glycosylated hemoglobin (Hb A1C)  Insulin pump in place  Mixed hypercholesterolemia and hypertriglyceridemia    Type 2 diabetes mellitus Hba1c goal less than 7.0, current Hba1c is 6.9. Will recommend:  Insulin Pump Settings: recommend as follows: HUMALOG Insulin with pump: T-slim control IQ, DexCom G6  BASAL settings: 12-8 am = 1.65 8-12 noon= 1.8 Noon to 12 MN = 1.75 Carb 1:2.5 (from 1:3) correction factor I: 25>100 Active insulin 3 hours  Start bolusing 5 min before each meal   -check blood sugars before each meals and at bedtime -enter blood sugars in the pump and calibrate three times a day. Calibrate when fasting and at bedtime and one calibration before the meal -avoid calibrations when the blood sugar is rising rapidly or falling -pre bolus carbs when eating. Pre bolus needs to be 5 minutes before the meal -do not enter false or fake carbohydrates -do not enter carbohydratess after you eat and try to catch up with a high blood sugar -change sets on time to have consistent insulin absorption and prevent scar formation -call the office /answering service for blood sugar questions regarding hyper or hypoglycemia or sensor and transmitter issues -insulin settings updated and scanned in chart  No known contraindications to any of above medications  Hyperlipidemia -Last LDL off goal: 94 -on rosuvastatin 40 mg every day and repatha 140 mg q14 days -Follow low fat diet and exercise, pt likes fried foods   -Blood pressure goal <140/90 - Microalbumin/creatinine goal < 30 -on losartan 25 mg every day -diet changes including  salt restriction -limit eating outside -counseled BP targets per standards of diabetes care -Uncontrolled blood pressure can lead to retinopathy, nephropathy and cardiovascular and atherosclerotic heart disease  Reviewed and counseled on: -A1C target -Blood sugar targets -Complications of uncontrolled diabetes  -Checking blood sugar before meals and bedtime and bring log next visit -All medications with mechanism of action and side effects -Hypoglycemia management: rule of 15's, Glucagon Emergency Kit and medical alert ID -low-carb low-fat plate-method diet -At least 20 minutes of physical activity per day -Annual dilated retinal eye exam and foot exam -compliance and follow up needs -follow up as scheduled or earlier if problem gets worse  Call if blood sugar is less than 70 or consistently above 250    Take a 15 gm snack of carbohydrate at bedtime before you go to sleep if your blood sugar is less than 100.    If you are going to fast after midnight for a test or procedure, ask your physician for instructions on how to reduce/decrease your insulin dose.    Call if blood sugar is less than 70 or consistently above 250  -Treating a low sugar by rule of 15  (15 gms of sugar every 15 min until sugar is more than 70) If you feel your sugar is low, test your sugar to be sure If your sugar is low (less than 70), then take 15 grams of a fast acting Carbohydrate (3-4 glucose tablets or glucose gel or 4 ounces of juice or regular soda) Recheck your sugar 15 min after treating low to make sure it is more than 70 If  sugar is still less than 70, treat again with 15 grams of carbohydrate          Don't drive the hour of hypoglycemia  If unconscious/unable to eat or drink by mouth, use glucagon injection or nasal spray baqsimi and call 911. Can repeat again in 15 min if still unconscious.    I have reviewed current medications, nurse's notes, allergies, vital signs, past medical and surgical  history, family medical history, and social history for this encounter. Counseled patient on symptoms, examination findings, lab findings, imaging results, treatment decisions and monitoring and prognosis. The patient understood the recommendations and agrees with the treatment plan. All questions regarding treatment plan were fully answered.  Return in about 82 months (around 11/24/2023).   Altamese Brinsmade, MD 08/24/23   History of Present Illness [name] is a 82 y.o. year old male who presents to our clinic for a follow up for Type 2 diabetes mellitus. [name] was first diagnosed in 1986.   Previous history: Insulin was started in 1995  Recent history:     Non-insulin hypoglycemic drugs: None     Insulin pump: T-slim control IQ       BASAL settings: 12-8 am = 1.65 8-12 noon= 1.8 Noon to 12 MN = 1.75  Carb 1:4 dinner Carb ratio 1:25 for breakfast, lunch and dinner  Active insulin 3 hours, correction factor I: 40>100   Ambulatory BG Monitoring     CGM interpretation: At today's visit, we reviewed her CGM downloads. The full report is scanned in the media. Reviewing the CGM trends, Bg trend high after meals.    Insulin pump  Pump downloaded and reviewed. Blood sugars reviewed Basal rates reviewed Auto mode % reviewed Manual mode % reviewed Sensor wear % reviewed Readings above target % reviewed Readings at target % reviewed Readings below target % reviewed TDD units of insulin /day reviewed Changing sets on time reviewed Calibration reviewed Carb counting reviewed Basal bolus ratio reviewed Basal % reviewed Bolus % reviewed Duration of pump suspends reviewed No reaction at insulin pump sites Set change=every 3 days Reservoir change = every 3 days   Physical Exam  BP (!) 130/50   Pulse 74   Ht 6\' 1"  (1.854 m)   Wt 269 lb (122 kg)   SpO2 98%   BMI 35.49 kg/m    Constitutional: well developed, well nourished Head: normocephalic, atraumatic Eyes:  sclera anicteric, no redness Neck: supple Lungs: normal respiratory effort Neurology: alert and oriented Skin: dry, no appreciable rashes Musculoskeletal: no appreciable defects Psychiatric: normal mood and affect  Current Medications Patient's Medications  New Prescriptions   No medications on file  Previous Medications   ACCU-CHEK FASTCLIX LANCETS MISC    USE TO CHECK BLOOD SUGAR UP TO 6 TIMES DAILY   ALFUZOSIN (UROXATRAL) 10 MG 24 HR TABLET    TAKE 1 TABLET BY MOUTH  DAILY WITH BREAKFAST   AMITRIPTYLINE (ELAVIL) 50 MG TABLET    TAKE ONE-HALF TABLET BY MOUTH AT BEDTIME   AMLODIPINE (NORVASC) 2.5 MG TABLET    Take 1 tablet (2.5 mg total) by mouth daily.   BLOOD GLUCOSE MONITORING SUPPL (ACCU-CHEK AVIVA PLUS) W/DEVICE KIT    Use as directed   CETIRIZINE (ZYRTEC) 10 MG TABLET    Take 10 mg by mouth daily as needed for allergies.   CONTINUOUS BLOOD GLUC SENSOR (DEXCOM G6 SENSOR) MISC    Inject 1 Device into the skin as directed. Apply to skin SQ every 10 days  DICLOFENAC SODIUM (VOLTAREN) 1 % GEL    Apply 2 g topically 4 (four) times daily.   EVOLOCUMAB (REPATHA SURECLICK) 140 MG/ML SOAJ    Inject 140 mg into the skin every 14 (fourteen) days.   FLUTICASONE (FLONASE) 50 MCG/ACT NASAL SPRAY    Place 1 spray into the nose daily as needed for allergies.    FOLIC ACID (FOLVITE) 1 MG TABLET    Take 1 mg by mouth daily.   FUROSEMIDE (LASIX) 20 MG TABLET    TAKE 1 TABLET BY MOUTH  DAILY   GABAPENTIN (NEURONTIN) 300 MG CAPSULE    Take 1 capsule (300 mg total) by mouth 3 (three) times daily.   GLUCOSE BLOOD (ACCU-CHEK AVIVA PLUS) TEST STRIP    USE TO TEST BLOOD SUGAR 6  TIMES PER DAY; E11.9   INSULIN INFUSION PUMP SUPPLIES (AUTOSOFT XC INFUSION SET) MISC    Inject 1 Act into the skin every other day. Use with 9mm cannula and 23 inch tubing. *5 boxes (90 day supply)   INSULIN INFUSION PUMP SUPPLIES (T:SLIM INSULIN CARTRIDGE ) MISC    Use with insulin pump, fill once every 2 days.   INSULIN  LISPRO (HUMALOG) 100 UNIT/ML INJECTION    INJECT SUBCUTANEOUSLY VIA  INSULIN PUMP TOTAL OF 120 UNITS  DAILY   LIDOCAINE (LIDODERM) 5 %    Place 1 patch onto the skin daily. Remove & Discard patch within 12 hours or as directed by MD   MULTIPLE VITAMINS-MINERALS (CENTRUM SILVER PO)    Take 1 tablet by mouth daily.   OMEGA-3 FATTY ACIDS (FISH OIL) 1200 MG CAPS    Take 1,200 mg by mouth daily.   ROSUVASTATIN (CRESTOR) 40 MG TABLET    TAKE 1 TABLET BY MOUTH AT  BEDTIME   VITAMIN B-12 (CYANOCOBALAMIN) 1000 MCG TABLET    Take 1,000 mcg by mouth daily.  Modified Medications   No medications on file  Discontinued Medications   No medications on file    Allergies Allergies  Allergen Reactions   Atorvastatin Calcium    Lipitor [Atorvastatin] Other (See Comments)    unknown   Niacin     REACTION: Severe heartburn   Pioglitazone     REACTION: Edema    Past Medical History Past Medical History:  Diagnosis Date   Allergy    Anal fissure    Anemia, unspecified    Arthritis    Spinal OA   BACK PAIN, LUMBAR 10/23/2007   CARDIAC MURMUR, SYSTOLIC 10/27/2008   DIABETES MELLITUS, TYPE I 05/20/2007   DIASTOLIC DYSFUNCTION 11/04/2008   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HCC)    Duodenitis    Edema 05/12/2008   Esophageal stricture    GERD (gastroesophageal reflux disease)    GOUT 05/20/2007   Gynecomastia    Hepatic steatosis    HYPERLIPIDEMIA 05/20/2007   Hypertension    Hypogonadism male    Morbid obesity (HCC) 09/10/2009   OBSTRUCTIVE SLEEP APNEA 11/04/2007   Personal history of colonic polyps    RENAL INSUFFICIENCY 05/12/2008   Sleep apnea    uses cpap   Squamous cell carcinoma of skin 02/16/2020   left lower leg, anterior-cx3,66fu   Urolithiasis     Past Surgical History Past Surgical History:  Procedure Laterality Date   Abdominal US  09/1997   arm fracture Left 1958   with hardware   Colon cancer screening     COLONOSCOPY  08/18/2004   diverticulitis   COLONOSCOPY   09/24/2009  ELECTROCARDIOGRAM  02/2006   FLEXIBLE SIGMOIDOSCOPY  03/04/2001   polyps, anal fissure   GANGLION CYST EXCISION Left 1980   HIP PINNING,CANNULATED Right 04/25/2021   Procedure: CANNULATED HIP PINNING;  Surgeon: Samson Frederic, MD;  Location: [facility];  Service: Orthopedics;  Laterality: Right;   Lower Arterial  04/13/2004   POLYPECTOMY     Rest Cardiolite  03/19/2003    Family History family history includes Colon cancer (age of onset: 20) in his brother; Colon polyps in his brother; Healthy in his daughter and son; Lung cancer in his brother; Other in his mother.  Social History Social History   Socioeconomic History   Marital status: Married    Spouse name: Not on file   Number of children: Not on file   Years of education: Not on file   Highest education level: Not on file  Occupational History    Employer: [employer]  Tobacco Use   Smoking status: Former    Current packs/day: 0.00    Average packs/day: 2.0 packs/day for 31.0 years (62.0 ttl pk-yrs)    Types: Cigarettes    Start date: 43    Quit date: 10/23/1982    Years since quitting: 40.8   Smokeless tobacco: Never  Vaping Use   Vaping status: Never Used  Substance and Sexual Activity   Alcohol use: No    Alcohol/week: 0.0 standard drinks of alcohol   Drug use: No   Sexual activity: Not on file  Other Topics Concern   Not on file  Social History Narrative   Lives with wife in a one story home.  Has a son and a daughter.     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     Education: 2 years of college.   Social Determinants of Health   Financial Resource Strain: Low Risk  (01/26/2023)   Overall Financial Resource Strain (CARDIA)    Difficulty of Paying Living Expenses: Not hard at all  Food Insecurity: No Food Insecurity (01/26/2023)   Hunger Vital Sign    Worried About Running Out of Food in the Last Year: Never true    Ran Out of Food in the Last Year: Never true  Transportation  Needs: No Transportation Needs (01/26/2023)   PRAPARE - Administrator, Civil Service (Medical): No    Lack of Transportation (Non-Medical): No  Physical Activity: Inactive (01/26/2023)   Exercise Vital Sign    Days of Exercise per Week: 0 days    Minutes of Exercise per Session: 0 min  Stress: No Stress Concern Present (01/26/2023)   [employer] of Occupational Health - Occupational Stress Questionnaire    Feeling of Stress : Not at all  Social Connections: Socially Integrated (01/10/2022)   Social Connection and Isolation Panel [NHANES]    Frequency of Communication with Friends and Family: Twice a week    Frequency of Social Gatherings with Friends and Family: Twice a week    Attends Religious Services: 1 to 4 times per year    Active Member of [employer] or Organizations: Yes    Attends Banker Meetings: 1 to 4 times per year    Marital Status: Married  Catering manager Violence: Not At Risk (01/10/2022)   Humiliation, Afraid, Rape, and Kick questionnaire    Fear of Current or Ex-Partner: No    Emotionally Abused: No    Physically Abused: No    Sexually Abused: No    Lab Results  Component Value  Date   HGBA1C 6.9 (A) 08/24/2023   Lab Results  Component Value Date   CHOL 161 06/13/2023   Lab Results  Component Value Date   HDL 38 (L) 06/13/2023   Lab Results  Component Value Date   LDLCALC 94 06/13/2023   Lab Results  Component Value Date   TRIG 164 (H) 06/13/2023   Lab Results  Component Value Date   CHOLHDL 4.2 06/13/2023   Lab Results  Component Value Date   CREATININE 1.75 (H) 05/23/2023   Lab Results  Component Value Date   MICROALBUR 7.5 (H) 12/15/2022    Parts of this note may have been dictated using voice recognition software. There may be variances in spelling and vocabulary which are unintentional. Not all errors are proofread. Please notify the Thereasa Parkin if any discrepancies are noted or if the meaning of any statement is not  clear.

## 2023-08-27 ENCOUNTER — Encounter: Payer: Self-pay | Admitting: "Endocrinology

## 2023-08-30 ENCOUNTER — Ambulatory Visit: Payer: Medicare Other | Admitting: Podiatry

## 2023-08-30 ENCOUNTER — Encounter: Payer: Self-pay | Admitting: Podiatry

## 2023-08-30 DIAGNOSIS — E1142 Type 2 diabetes mellitus with diabetic polyneuropathy: Secondary | ICD-10-CM | POA: Diagnosis not present

## 2023-08-30 DIAGNOSIS — Z794 Long term (current) use of insulin: Secondary | ICD-10-CM | POA: Diagnosis not present

## 2023-08-30 DIAGNOSIS — E119 Type 2 diabetes mellitus without complications: Secondary | ICD-10-CM | POA: Diagnosis not present

## 2023-08-30 NOTE — Progress Notes (Signed)
  Subjective:  Patient ID: [name], male    DOB: 11-23-1940,  MRN: [id number]  Chief Complaint  Patient presents with   Diabetes    PATIENT STATES THAT HIS FEET TINGLE SOMETIMES FROM THE KNEE DOWN , BUT OTHER THEN THAT THEY ARE FIND . PATIENT WILL LIKE HIS TOE NAILS CUT .    82 y.o. male presents with the above complaint. History confirmed with patient.  Complains of tingling and burning in toes occasionally, says his A1c is well controlled at 6.4%, still does not wake him up at night or limit his walking.  Objective:  Physical Exam: warm, good capillary refill, no trophic changes or ulcerative lesions, normal DP and PT pulses, normal monofilament exam, and normal sensory exam.  Thickened elongated dystrophic nails  Assessment:   1. Encounter for comprehensive diabetic foot examination, type 2 diabetes mellitus (HCC)   2. Type 2 diabetes mellitus with diabetic polyneuropathy, with long-term current use of insulin (HCC)       Plan:  Patient was evaluated and treated and all questions answered.  Patient educated on diabetes. Discussed proper diabetic foot care and discussed risks and complications of disease. Educated patient in depth on reasons to return to the office immediately should he/she discover anything concerning or new on the feet. All questions answered. Discussed proper shoes as well.  Annual at risk for diabetic foot exam performed today does have some risk due to his neuropathy.  Circulation is good.  Nails were trimmed in length and thickness with a sharp nail nipper today to a tolerable level.  He qualifies for at risk footcare due to his diabetes and neuropathy.  He will follow-up on a regular basis with [place] for this.    Return in about 3 months (around 11/30/2023) for at risk diabetic foot care.

## 2023-10-17 NOTE — Progress Notes (Unsigned)
Subjective:    Patient ID: [name], male    DOB: 11-02-1940, 82 y.o.   MRN: [id number]  HPI   male former smoker followed for OSA, complicated by obesity, HBP, DM 2, diastolic dysfunction, GERD NPSG 2004 AHI 9/hr HST 09/14/22- AHI 12.3/ hr, desaturation to 83%, body weight 273 lbs --------------------------------------------------------------------------------------------   10/17/22-  82 year old male former smoker followed for OSA, Lung Nodules,  complicated by obesity, HBP, DM 2, diastolic dysfunction, GERD, HLD, CKD,  CPAP auto 5-15/ [facility]       AirSense 11 AutoSet Download-compliance   Body weight today- 268 lbs Covid vax-3 Phizer Flu vax-had HST 09/14/22- AHI 12.3/ hr, desaturation to 83%, body weight 273 lbs Updated sleep study- treatment decision. He felt he was sleeping well without CPAP. He has a good machine, so I suggested he resume using it. Will need to change from [facility] as they are stopping CPAP work. He is aware of occasional rapid heartbeat without dizziness or chest pain.He is to discuss with his cardiologist.  10/18/23- 82 year old male former smoker followed for OSA, Lung Nodules,  complicated by obesity, HBP, DM 2, diastolic dysfunction, GERD, HLD, CKD,  CPAP auto 5-15/  > [facility]   AirSense 11 AutoSet Download-compliance   Body weight today-  ROS-see HPI   + = positive Constitutional:    weight loss, night sweats, fevers, chills, fatigue, lassitude. HEENT:    headaches, difficulty swallowing, tooth/dental problems, sore throat,       sneezing, itching, ear ache, nasal congestion, post nasal drip, snoring CV:    chest pain, orthopnea, PND, swelling in lower extremities, anasarca,                                   dizziness, +palpitations Resp:   shortness of breath with exertion or at rest.                productive cough,   non-productive cough, coughing up of blood.              change in color of mucus.  wheezing.   Skin:    rash or  lesions. GI:  No-   heartburn, indigestion, abdominal pain, nausea, vomiting, diarrhea,                 change in bowel habits, loss of appetite GU: dysuria, change in color of urine, no urgency or frequency.   flank pain. MS:   joint pain, stiffness, decreased range of motion, back pain. Neuro-     nothing unusual Psych:  change in mood or affect.  depression or anxiety.   memory loss. .    Objective:  OBJ- Physical Exam General- Alert, Oriented, Affect-appropriate/ pleasant , Distress- none acute, + obese Skin- rash-none, lesions- none, excoriation- none Lymphadenopathy- none Head- atraumatic            Eyes- +squint R eye closed            Ears- Hearing, canals-normal            Nose- Clear, no-Septal dev, mucus, polyps, erosion, perforation             Throat- Mallampati III , mucosa clear , drainage- none, tonsils- atrophic Neck- flexible , trachea midline, no stridor , thyroid nl, carotid no bruit Chest - symmetrical excursion , unlabored           Heart/CV- RRR ,  no murmur , no gallop  , no rub, nl s1 s2                           - JVD- none , edema- none, stasis changes- none, varices- none           Lung- clear to P&A, wheeze- none, cough- none , dullness-none, rub- none           Chest wall-  Abd-  Br/ Gen/ Rectal- Not done, not indicated Extrem- cyanosis- none, clubbing, none, atrophy- none, strength- nl, +cane Neuro- grossly intact to observation    Assessment & Plan:

## 2023-10-18 ENCOUNTER — Encounter: Payer: Self-pay | Admitting: Internal Medicine

## 2023-10-18 ENCOUNTER — Ambulatory Visit: Payer: Medicare Other | Admitting: Internal Medicine

## 2023-10-18 VITALS — BP 130/70 | HR 84 | Ht 72.0 in | Wt 263.0 lb

## 2023-10-18 DIAGNOSIS — Z87891 Personal history of nicotine dependence: Secondary | ICD-10-CM

## 2023-10-18 DIAGNOSIS — G4733 Obstructive sleep apnea (adult) (pediatric): Secondary | ICD-10-CM | POA: Diagnosis not present

## 2023-10-18 DIAGNOSIS — R911 Solitary pulmonary nodule: Secondary | ICD-10-CM | POA: Diagnosis not present

## 2023-10-18 DIAGNOSIS — R000 Tachycardia, unspecified: Secondary | ICD-10-CM

## 2023-10-18 NOTE — Patient Instructions (Signed)
I'm glad you are feeling well.  We are here to help if you need [place].  Ok to restart your CPAP at anytime if you feel you need it.

## 2023-10-29 ENCOUNTER — Encounter
Payer: Medicare Other | Attending: Physical Medicine and Rehabilitation | Admitting: Physical Medicine and Rehabilitation

## 2023-10-29 ENCOUNTER — Encounter: Payer: Self-pay | Admitting: Physical Medicine and Rehabilitation

## 2023-10-29 VITALS — BP 158/84 | HR 75 | Ht 72.0 in | Wt 265.2 lb

## 2023-10-29 DIAGNOSIS — E1169 Type 2 diabetes mellitus with other specified complication: Secondary | ICD-10-CM | POA: Insufficient documentation

## 2023-10-29 DIAGNOSIS — M5432 Sciatica, left side: Secondary | ICD-10-CM | POA: Diagnosis not present

## 2023-10-29 DIAGNOSIS — I152 Hypertension secondary to endocrine disorders: Secondary | ICD-10-CM | POA: Insufficient documentation

## 2023-10-29 DIAGNOSIS — E669 Obesity, unspecified: Secondary | ICD-10-CM | POA: Diagnosis present

## 2023-10-29 DIAGNOSIS — R269 Unspecified abnormalities of gait and mobility: Secondary | ICD-10-CM | POA: Diagnosis present

## 2023-10-29 DIAGNOSIS — G4701 Insomnia due to medical condition: Secondary | ICD-10-CM | POA: Insufficient documentation

## 2023-10-29 DIAGNOSIS — E1159 Type 2 diabetes mellitus with other circulatory complications: Secondary | ICD-10-CM | POA: Insufficient documentation

## 2023-10-29 DIAGNOSIS — Z794 Long term (current) use of insulin: Secondary | ICD-10-CM

## 2023-10-29 NOTE — Progress Notes (Signed)
 Subjective:    Patient ID: [name], male    DOB: 10/27/1940, 83 y.o.   MRN: [id number]   HPI  1) Left sided sciatica: -did not topamax  because of potential side effect -is not taken gabapentin  but would be interested in this -the pain radiates down his left leg to past his left knee -pain ranges from 3-7 -has a back brace but does not like to use it -lidocaine  patch 5% helps a little more  2) Knee buckling -hurt severely -had surgery for torn meniscus -his surgeon said his meniscus ripped in two -he walked out from the surgery  3) HTN: -elevated to 158/83 -does not check BP at home  Male with pmh of OSA, CKD, HTN, DM type 1 who presents for f/u of diabetic peripheral neuropathy and low back pain > neck pain.   On first visit, pt complained of b/l L>R back pain.  Started ~>20 years ago.  No inciting event.  Sitting/laying improve the pain.  Standing exacerbates the pain.  Sharp, burning pain.  Radiates to left posterior thigh.  Intermittent.  He has associated numbness/tingling.  He only tried ASA, which helps some.  Pain limits from doing ADLs and fishing.  He denies falls. Back pain has been ok especially if sitting down Can't stand more than 5 or 10 minutes Tried PT and cannot feel difference -pain is worst on left.  Last Qutenza  patch helped a lot for his back but he was charged $700, [facility] sent the patches to speciality pharmacy this time and they were 100% covered Qutenza  patches helped on feet where he has tingling. He has less pain in his feet except for his toes which he thinks is due to gout [name] since he was ere last, landed on his knees He is doing well decreasing snacking after dinner 169 CBG right now.  -he usually snacks at night on potato chips after dinner -does not eat much more baked potatoes Voltaren  cream helps the back pain but does not stop it When he fishes he feels pain from the jolting- fishing made it worse -He takes advil  and this helps a little.   Sometimes it moves into his legs.   Last clinic visit on 10/21/2020.  He had trigger point injections at that time.  Since that time, patient states he had great benefit for about a week.  He uses a TENS with benefit. Patient states he feels he is getting weaker. He is taking Baclofen  1-2/week.  Sleep has improved. He is taking Elavil  25.  He states he believe he lost weight, but not exercising. Denies falls. Lightheadedness has improved.    He does not take any other medications for his pain and does not desire to. He requires a cane for ambulation and has a handicap placard.   Blood pressure is better controlled today at 120/74  He has been to multiple speciialists and was told he will not be able to see from this eye after her had a fall on this eye Has not been using SPRINT PNS but it did help when he used it.  Has been having headaches.  -he is ready to try Qutenza  today -he has numbness and tingling from his shins down to his feet and it is constant and feels uncomfortable -his back pain has also been severe -wife accompanies him today -he has decreased sensation in both feet   Pain Inventory Average Pain 5 Pain Right Now 4 My pain is intermittent, sharp, and tingling  In the last 24 hours, has pain interfered with the following? General activity 3 Relation with others 0 Enjoyment of life 3 What TIME of day is your pain at its worst? daytime Sleep (in general) Fair  Pain is worse with: walking and standing Pain improves with: heat/ice Relief from Meds: 0     Family History  Problem Relation Age of Onset   Colon cancer Brother 33   Colon polyps Brother    Lung cancer Brother    Other Mother        MVA, deceased 82s   Healthy Daughter    Healthy Son    Rectal cancer Neg Hx    Stomach cancer Neg Hx    Social History   Socioeconomic History   Marital status: Married    Spouse name: Not on file   Number of children: Not on file   Years of education: Not on  file   Highest education level: Not on file  Occupational History    Employer: [employer]  Tobacco Use   Smoking status: Former    Current packs/day: 0.00    Average packs/day: 2.0 packs/day for 31.0 years (62.0 ttl pk-yrs)    Types: Cigarettes    Start date: 69    Quit date: 10/23/1982    Years since quitting: 41.0   Smokeless tobacco: Never  Vaping Use   Vaping status: Never Used  Substance and Sexual Activity   Alcohol  use: No    Alcohol /week: 0.0 standard drinks of alcohol    Drug use: No   Sexual activity: Not on file  Other Topics Concern   Not on file  Social History Narrative   Lives with wife in a one story home.  Has a son and a daughter.     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     Education: 2 years of college.   Social Drivers of Corporate Investment Banker Strain: Low Risk  (01/26/2023)   Overall Financial Resource Strain (CARDIA)    Difficulty of Paying Living Expenses: Not hard at all  Food Insecurity: No Food Insecurity (01/26/2023)   Hunger Vital Sign    Worried About Running Out of Food in the Last Year: Never true    Ran Out of Food in the Last Year: Never true  Transportation Needs: No Transportation Needs (01/26/2023)   PRAPARE - Administrator, Civil Service (Medical): No    Lack of Transportation (Non-Medical): No  Physical Activity: Inactive (01/26/2023)   Exercise Vital Sign    Days of Exercise per Week: 0 days    Minutes of Exercise per Session: 0 min  Stress: No Stress Concern Present (01/26/2023)   [employer] of Occupational Health - Occupational Stress Questionnaire    Feeling of Stress : Not at all  Social Connections: Socially Integrated (01/10/2022)   Social Connection and Isolation Panel [NHANES]    Frequency of Communication with Friends and Family: Twice a week    Frequency of Social Gatherings with Friends and Family: Twice a week    Attends Religious Services: 1 to 4 times per year    Active  Member of [employer] or Organizations: Yes    Attends Banker Meetings: 1 to 4 times per year    Marital Status: Married   Past Surgical History:  Procedure Laterality Date   Abdominal US   09/1997   arm fracture Left 1958   with hardware   Colon cancer screening  COLONOSCOPY  08/18/2004   diverticulitis   COLONOSCOPY  09/24/2009   ELECTROCARDIOGRAM  02/2006   FLEXIBLE SIGMOIDOSCOPY  03/04/2001   polyps, anal fissure   GANGLION CYST EXCISION Left 1980   HIP PINNING,CANNULATED Right 04/25/2021   Procedure: CANNULATED HIP PINNING;  Surgeon: Fidel Rogue, MD;  Location: [facility];  Service: Orthopedics;  Laterality: Right;   Lower Arterial  04/13/2004   POLYPECTOMY     Rest Cardiolite  03/19/2003   Past Medical History:  Diagnosis Date   Allergy    Anal fissure    Anemia, unspecified    Arthritis    Spinal OA   BACK PAIN, LUMBAR 10/23/2007   CARDIAC MURMUR, SYSTOLIC 10/27/2008   DIABETES MELLITUS, TYPE I 05/20/2007   DIASTOLIC DYSFUNCTION 11/04/2008   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HCC)    Duodenitis    Edema 05/12/2008   Esophageal stricture    GERD (gastroesophageal reflux disease)    GOUT 05/20/2007   Gynecomastia    Hepatic steatosis    HYPERLIPIDEMIA 05/20/2007   Hypertension    Hypogonadism male    Morbid obesity (HCC) 09/10/2009   OBSTRUCTIVE SLEEP APNEA 11/04/2007   Personal history of colonic polyps    RENAL INSUFFICIENCY 05/12/2008   Sleep apnea    uses cpap   Squamous cell carcinoma of skin 02/16/2020   left lower leg, anterior-cx3,34fu   Urolithiasis    BP (!) 158/83   Pulse 75   Ht 6' (1.829 m)   Wt 265 lb 3.2 oz (120.3 kg)   SpO2 97%   BMI 35.97 kg/m   Opioid Risk Score:   Fall Risk Score:  `1  Depression screen PHQ 2/9     10/29/2023    2:35 PM 06/26/2023    2:38 PM 05/23/2023    9:24 AM 03/21/2023    8:40 AM 01/26/2023    2:18 PM 01/18/2023   10:17 AM 11/21/2022   10:42 AM  Depression screen PHQ 2/9  Decreased Interest 0 0 0 0  0 0 0  Down, Depressed, Hopeless 0 0 0 0 0 0 0  PHQ - 2 Score 0 0 0 0 0 0 0    Review of Systems  HENT: Negative.    Eyes: Negative.   Respiratory:  Positive for apnea and shortness of breath.   Cardiovascular: Negative.   Gastrointestinal: Negative.   Endocrine:       Diabetic High blood sugar  Genitourinary: Negative.           Musculoskeletal:  Positive for arthralgias, back pain, gait problem, myalgias and neck pain.  Skin: Negative.   Allergic/Immunologic: Negative.   Neurological:  Positive for weakness and numbness.       Tingling  Hematological: Negative.   All other systems reviewed and are negative.     Objective:   Physical Exam  Constitutional: No distress . Vital signs reviewed. BMI 35.36, weight 263 lbs, 156/85 Gen: no distress, normal appearing HEENT: oral mucosa pink and moist, NCAT Cardio: Reg rate Chest: normal effort, normal rate of breathing Abd: soft, non-distended Ext: no edema Psych: pleasant, normal affect Skin: intact Musc: TTP lumbar spinous process Musc: Gait: Mildly antalgic. Ambulating with cane at all times.  Neuro: Alert  HOH             Strength          5/5 in all LE myotomes Decreased sensation in bilateral feet Pain is worst with walking    Assessment &  Plan:  Male with pmh of OSA, CKD, HTN, DM type 1 with peripheral neuropathy presents for follow up of low back pain > neck pain.     1. Chronic mechanical low back pain             MRIs C,L-spine early 2016 suggesting multilevel spondylosis with mild b/l multilevel foraminal narrowing C4-C7 and shallow disc bulge at L5-S1 without central canal or foraminal stenosis.   Continue lidocaine  patch  Discussed physical therapy but he prefers not to pursue  Discussed prednisone   D/c gabapentin   Discussed that pain informs regarding what movements to avoid  -discussed medial branch blocks  Discussed core strengthening exercises             Avoid NSAIDs due to CKD, discussed with  patient since he takes Advil   Continue voltaren  gel, advised that he could use up to 4 times per day.              Unable to tolerate Gabapentin , Lyrica              Cont HEP  Discussed steroid injections but they do not seem to help  Discussed repeat MIR of lumbar spine  Continue TENS             Aquatic therapy, not going anymore, states ineffective             Cont tylenol   Robaxin  500 TID PRN, changed to Baclofen  10 TID PRN due to change insurance, continue   Continue Cymbalta  60mg              Cont back brace during periods of excessive activity, reminded to avoid prolonged use  Continue Lidoderm  OTC, reminded              Encouraged rest breaks             Acknowledges he can do more if he make a conscious effort             PT completed    Pain over spinous process where clothing is resulting increased pressure.  Encourage change in clothing.   Sprint PNS removed today.   -Provided with a pain relief journal and discussed that it contains foods and lifestyle tips to naturally help to improve pain. Discussed that these lifestyle strategies are also very good for health unlike some medications which can have negative side effects. Discussed that the act of keeping a journal can be therapeutic and helpful to realize patterns what helps to trigger and alleviate pain.  -Discussed Qutenza  as an option for neuropathic pain control. Discussed that this is a capsaicin  patch, stronger than capsaicin  cream. Discussed that it is currently approved for diabetic peripheral neuropathy and post-herpetic neuralgia, but that it has also shown benefit in treating other forms of neuropathy. Provided patient with link to site to learn more about the patch: [email or web address].qutenza .com/. Discussed that the patch would be placed in office and benefits usually last 3 months. Discussed that unintended exposure to capsaicin  can cause severe irritation of eyes, mucous membranes, respiratory tract, and skin, but that  Qutenza  is a local treatment and does not have the systemic side effects of other nerve medications. Discussed that there may be pain, itching, erythema, and decreased sensory function associated with the application of Qutenza . Side effects usually subside within 1 week. A cold pack of analgesic medications can help with these side effects. Blood pressure can also be increased due to pain associated with administration of the patch.  2. Sacroiliitis             See #1             Continue brace             Not main pain generator at present, does not wish to proceed with injection at this time, particularly due to DM   3. Sleep disturbance             Recently new mattress  -discussed that his phone says he sleeps well  Continue gabapentin  300mg  TID PRN             Continue CPAP             Improved -Try to go outside near sunrise -Get exercise during the day.  -Turn off all devices an hour before bedtime.  -Teas that can benefit: chamomile, valerian root, Brahmi (Bacopa) -Can consider over the counter melatonin, magnesium , and/or L-theanine. Melatonin is an anti-oxidant with multiple health benefits. Magnesium  is involved in greater than 300 enzymatic reactions in the body and most of us  are deficient as our soil is often depleted. There are 7 different types of magnesium - Bioptemizer's is a supplement with all 7 types, and each has unique benefits. Magnesium  can also help with constipation and anxiety.  -Pistachios naturally increase the production of melatonin -Cozy Earth bamboo bed sheets are free from toxic chemicals.  -Tart cherry juice or a tart cherry supplement can improve sleep and soreness post-workout   4. Myalgia  Will schedule for trigger point injections   5. Diabetic neuropathy  -discussed that diabetes has been well controlled             See #1, #3            Continue elavil  to 50mg  qhs, continue 25mg  educated on signs/symptoms of seratonin syndrome  previously  Provided dietary education  Refilled Cymbalta .   -Discussed Qutenza  as an option for neuropathic pain control. Discussed that this is a capsaicin  patch, stronger than capsaicin  cream. Discussed that it is currently approved for diabetic peripheral neuropathy and post-herpetic neuralgia, but that it has also shown benefit in treating other forms of neuropathy. Provided patient with link to site to learn more about the patch: https://www.qutenza .com/. Discussed that the patch would be placed in office and benefits usually last 3 months. Discussed that unintended exposure to capsaicin  can cause severe irritation of eyes, mucous membranes, respiratory tract, and skin, but that Qutenza  is a local treatment and does not have the systemic side effects of other nerve medications. Discussed that there may be pain, itching, erythema, and decreased sensory function associated with the application of Qutenza . Side effects usually subside within 1 week. A cold pack of analgesic medications can help with these side effects. Blood pressure can also be increased due to pain associated with administration of the patch.   Recommended no snacking after dinner -Discussed current symptoms of pain and history of pain.  -Discussed benefits of exercise in reducing pain. -Discussed following foods that may reduce pain: 1) Ginger (especially studied for arthritis)- reduce leukotriene production to decrease inflammation 2) Blueberries- high in phytonutrients that decrease inflammation 3) Salmon- marine omega-3s reduce joint swelling and pain 4) Pumpkin seeds- reduce inflammation 5) dark chocolate- reduces inflammation 6) turmeric- reduces inflammation 7) tart cherries - reduce pain and stiffness 8) extra virgin olive oil - its compound olecanthal helps to block prostaglandins  9) chili peppers- can be eaten or applied topically via capsaicin   10) mint- helpful for headache, muscle aches, joint pain, and  itching 11) garlic- reduces inflammation  Link to further information on diet for chronic pain: http://www.bray.com/   --Discussed Qutenza  as an option for neuropathic pain control. Discussed that this is a capsaicin  patch, stronger than capsaicin  cream. Discussed that it is currently approved for diabetic peripheral neuropathy and post-herpetic neuralgia, but that it has also shown benefit in treating other forms of neuropathy. Provided patient with link to site to learn more about the patch: https://www.qutenza .com/. Discussed that the patch would be placed in office and benefits usually last 3 months. Discussed that unintended exposure to capsaicin  can cause severe irritation of eyes, mucous membranes, respiratory tract, and skin, but that Qutenza  is a local treatment and does not have the systemic side effects of other nerve medications. Discussed that there may be pain, itching, erythema, and decreased sensory function associated with the application of Qutenza . Side effects usually subside within 1 week. A cold pack of analgesic medications can help with these side effects. Blood pressure can also be increased due to pain associated with administration of the patch.   -discussed lidocaine  patches can be applied on the feet as well  -discussed that he is no longer having pain, just the tingling   6. Morbid obesity             Not interested in seeing dietitian at this time  Encouraged activity   No attempts at weight loss  Discussed Topamax .   Commended on improvement!   7. Gait abnormality             Completed therapies, cont HEP  D/c cane   8. Skin cancer of left lateral foot: discussed that this was skin cancer and it was removed  9. Lightheadedness  Encouraged BP monitoring at home  10. Knee pain: -recommended applying voltaren  gel -discussed that ne needed meniscal surgery  11. Chronic gout: continue  allopurinol , recommended to avoid fructose  12. HTN: -BP is 158/83 today.  -Advised checking BP daily at home and logging results to bring into follow-up appointment with PCP and myself. -Reviewed BP meds today.  -Advised regarding healthy foods that can help lower blood pressure and provided with a list: 1) citrus foods- high in vitamins and minerals 2) salmon and other fatty fish - reduces inflammation and oxylipins 3) swiss chard (leafy green)- high level of nitrates 4) pumpkin seeds- one of the best natural sources of magnesium  5) Beans and lentils- high in fiber, magnesium , and potassium 6) Berries- high in flavonoids 7) Amaranth (whole grain, can be cooked similarly to rice and oats)- high in magnesium  and fiber 8) Pistachios- even more effective at reducing BP than other nuts 9) Carrots- high in phenolic compounds that relax blood vessels and reduce inflammation 10) Celery- contain phthalides that relax tissues of arterial walls 11) Tomatoes- can also improve cholesterol and reduce risk of heart disease 12) Broccoli- good source of magnesium , calcium , and potassium 13) Greek yogurt: high in potassium and calcium  14) Herbs and spices: Celery seed, cilantro, saffron, lemongrass, black cumin, ginseng, cinnamon, cardamom, sweet basil, and ginger 15) Chia and flax seeds- also help to lower cholesterol and blood sugar 16) Beets- high levels of nitrates that relax blood vessels  17) spinach and bananas- high in potassium  -Provided lise of supplements that can help with hypertension:  1) magnesium : one high quality brand is Bioptemizers since it contains all 7 types of magnesium , otherwise over the counter magnesium  gluconate 400mg   is a good option 2) B vitamins 3) vitamin D  4) potassium 5) CoQ10 6) L-arginine 7) Vitamin C 8) Beetroot -Educated that goal BP is 120/80. -Made goal to incorporate some of the above foods into diet.

## 2023-10-29 NOTE — Patient Instructions (Signed)
 HTN: Advised checking BP daily at home and logging results to bring into follow-up appointment with PCP and myself. -Reviewed BP meds today.  -Advised regarding healthy foods that can help lower blood pressure and provided with a list: 1) citrus foods- high in vitamins and minerals 2) salmon and other fatty fish - reduces inflammation and oxylipins 3) swiss chard (leafy green)- high level of nitrates 4) pumpkin seeds- one of the best natural sources of magnesium 5) Beans and lentils- high in fiber, magnesium, and potassium 6) Berries- high in flavonoids 7) Amaranth (whole grain, can be cooked similarly to rice and oats)- high in magnesium and fiber 8) Pistachios- even more effective at reducing BP than other nuts 9) Carrots- high in phenolic compounds that relax blood vessels and reduce inflammation 10) Celery- contain phthalides that relax tissues of arterial walls 11) Tomatoes- can also improve cholesterol and reduce risk of heart disease 12) Broccoli- good source of magnesium, calcium, and potassium 13) Greek yogurt: high in potassium and calcium 14) Herbs and spices: Celery seed, cilantro, saffron, lemongrass, black cumin, ginseng, cinnamon, cardamom, sweet basil, and ginger 15) Chia and flax seeds- also help to lower cholesterol and blood sugar 16) Beets- high levels of nitrates that relax blood vessels  17) spinach and bananas- high in potassium  -Provided lise of supplements that can help with hypertension:  1) magnesium: one high quality brand is Bioptemizers since it contains all 7 types of magnesium, otherwise over the counter magnesium gluconate 400mg  is a good option 2) B vitamins 3) vitamin D 4) potassium 5) CoQ10 6) L-arginine 7) Vitamin C 8) Beetroot -Educated that goal BP is 120/80. -Made goal to incorporate some of the above foods into diet.

## 2023-11-02 ENCOUNTER — Telehealth: Payer: Self-pay | Admitting: Pharmacy Technician

## 2023-11-02 ENCOUNTER — Telehealth: Payer: Self-pay | Admitting: Internal Medicine

## 2023-11-02 ENCOUNTER — Other Ambulatory Visit (HOSPITAL_COMMUNITY): Payer: Self-pay

## 2023-11-02 NOTE — Telephone Encounter (Signed)
 Pharmacy Patient Advocate Encounter  Received notification from [employer] that Prior Authorization for repatha  has been APPROVED from 11/02/23 to 10/22/24. Ran test claim, Copay is $25.00 one month. This test claim was processed through [facility]- copay amounts may vary at other pharmacies due to pharmacy/plan contracts, or as the patient moves through the different stages of their insurance plan.   PA #/Case ID/Reference #: [id number]

## 2023-11-02 NOTE — Telephone Encounter (Signed)
 Pharmacy Patient Advocate Encounter   Received notification from Pt Calls Messages that prior authorization for repatha  is required/requested.   Insurance verification completed.   The patient is insured through [facility] .   Per test claim: PA required; PA submitted to above mentioned insurance via CoverMyMeds Key/confirmation #/EOC [id number] Status is pending

## 2023-11-02 NOTE — Telephone Encounter (Signed)
 Kenyatta with [facility] was calling to get an update on PA for Repatha

## 2023-11-09 ENCOUNTER — Telehealth: Payer: Self-pay | Admitting: Internal Medicine

## 2023-11-09 ENCOUNTER — Encounter: Payer: Self-pay | Admitting: Student

## 2023-11-09 DIAGNOSIS — E782 Mixed hyperlipidemia: Secondary | ICD-10-CM

## 2023-11-09 DIAGNOSIS — I2510 Atherosclerotic heart disease of native coronary artery without angina pectoris: Secondary | ICD-10-CM

## 2023-11-09 DIAGNOSIS — I700 Atherosclerosis of aorta: Secondary | ICD-10-CM

## 2023-11-09 MED ORDER — REPATHA SURECLICK 140 MG/ML ~~LOC~~ SOAJ
140.0000 mg | SUBCUTANEOUS | 11 refills | Status: DC
Start: 2023-11-09 — End: 2024-02-26

## 2023-11-09 NOTE — Telephone Encounter (Signed)
*  STAT* If patient is at the pharmacy, call can be transferred to refill team.   1. Which medications need to be refilled? (please list name of each medication and dose if known)   Evolocumab (REPATHA SURECLICK) 140 MG/ML SOAJ    4. Which pharmacy/location (including street and city if local pharmacy) is medication to be sent to? [id number] - [place], [place] - [address] AT [address]     5. Do they need a 30 day or 90 day supply?90

## 2023-11-09 NOTE — Telephone Encounter (Signed)
Error

## 2023-11-14 ENCOUNTER — Other Ambulatory Visit: Payer: Self-pay | Admitting: Family Medicine

## 2023-11-22 ENCOUNTER — Encounter: Payer: Self-pay | Admitting: Family Medicine

## 2023-11-22 ENCOUNTER — Ambulatory Visit: Payer: Medicare Other | Admitting: Family Medicine

## 2023-11-22 VITALS — BP 138/68 | HR 84 | Temp 98.0°F | Ht 72.0 in | Wt 258.4 lb

## 2023-11-22 DIAGNOSIS — I10 Essential (primary) hypertension: Secondary | ICD-10-CM

## 2023-11-22 DIAGNOSIS — R300 Dysuria: Secondary | ICD-10-CM

## 2023-11-22 LAB — POC URINALSYSI DIPSTICK (AUTOMATED)
Bilirubin, UA: NEGATIVE
Blood, UA: NEGATIVE
Glucose, UA: POSITIVE — AB
Ketones, UA: NEGATIVE
Nitrite, UA: NEGATIVE
Protein, UA: POSITIVE — AB
Spec Grav, UA: 1.025 (ref 1.010–1.025)
Urobilinogen, UA: 0.2 U/dL
pH, UA: 6 (ref 5.0–8.0)

## 2023-11-22 MED ORDER — AMLODIPINE BESYLATE 2.5 MG PO TABS
2.5000 mg | ORAL_TABLET | Freq: Every day | ORAL | 3 refills | Status: DC
Start: 2023-11-22 — End: 2023-11-30

## 2023-11-22 NOTE — Progress Notes (Signed)
Established Patient Office Visit   Subjective:  Patient ID: [name], male    DOB: 01-12-1941  Age: 83 y.o. MRN: [id number]  Chief Complaint  Patient presents with   Urinary Tract Infection    For 2 weeks had antibiotics, still burning with urination, discuss taking amlodipine    Urinary Tract Infection  Pertinent negatives include no chills, frequency, hematuria, nausea, urgency or vomiting.   Encounter Diagnoses  Name Primary?   Dysuria Yes   Essential hypertension    For follow-up of dysuria with UTI status posttreatment at [facility] urology with 5 days of Cipro and then doxycycline.  There were 2 antibiotic injections given.  Dysuria has greatly improved.  He feels some slight dysuria at the start of his stream that clears by midstream.  There is been no fevers chills nausea or vomiting.  He is uncircumcised but denies any swelling, erythema or difficulty retracting his foreskin.  [facility].  Has been out of amlodipine 2.5 mg daily.  Has follow-up with cardiology next month.   Review of Systems  Constitutional: Negative.  Negative for chills and fever.  HENT: Negative.    Eyes:  Negative for blurred vision, discharge and redness.  Respiratory: Negative.    Cardiovascular: Negative.   Gastrointestinal:  Negative for abdominal pain, nausea and vomiting.  Genitourinary:  Positive for dysuria. Negative for frequency, hematuria and urgency.  Musculoskeletal: Negative.  Negative for myalgias.  Skin:  Negative for rash.  Neurological:  Negative for tingling, loss of consciousness and weakness.  Endo/Heme/Allergies:  Negative for polydipsia.     Current Outpatient Medications:    Accu-Chek FastClix Lancets MISC, USE TO CHECK BLOOD SUGAR UP TO 6 TIMES DAILY, Disp: 612 each, Rfl: 3   alfuzosin (UROXATRAL) 10 MG 24 hr tablet, TAKE 1 TABLET BY MOUTH  DAILY WITH BREAKFAST, Disp: 90 tablet, Rfl: 3   amitriptyline (ELAVIL) 50 MG tablet, TAKE  ONE-HALF TABLET BY MOUTH AT BEDTIME, Disp: 45 tablet, Rfl: 3   Blood Glucose Monitoring Suppl (ACCU-CHEK AVIVA PLUS) w/Device KIT, Use as directed, Disp: 1 kit, Rfl: 0   cetirizine (ZYRTEC) 10 MG tablet, Take 10 mg by mouth daily as needed for allergies., Disp: , Rfl:    Continuous Blood Gluc Sensor (DEXCOM G6 SENSOR) MISC, Inject 1 Device into the skin as directed. Apply to skin SQ every 10 days, Disp: 9 each, Rfl: 3   diclofenac Sodium (VOLTAREN) 1 % GEL, Apply 2 g topically 4 (four) times daily., Disp: 150 g, Rfl: 3   Evolocumab (REPATHA SURECLICK) 140 MG/ML SOAJ, Inject 140 mg into the skin every 14 (fourteen) days., Disp: 2 mL, Rfl: 11   fluticasone (FLONASE) 50 MCG/ACT nasal spray, Place 1 spray into the nose daily as needed for allergies. , Disp: , Rfl:    folic acid (FOLVITE) 1 MG tablet, Take 1 mg by mouth daily., Disp: , Rfl:    furosemide (LASIX) 20 MG tablet, TAKE 1 TABLET BY MOUTH  DAILY, Disp: 90 tablet, Rfl: 3   glucose blood (ACCU-CHEK AVIVA PLUS) test strip, USE TO TEST BLOOD SUGAR 6  TIMES PER DAY; E11.9, Disp: 600 each, Rfl: 11   Insulin Infusion Pump Supplies (AUTOSOFT XC INFUSION SET) MISC, Inject 1 Act into the skin every other day. Use with 9mm cannula and 23 inch tubing. *5 boxes (90 day supply), Disp: 5 each, Rfl: 3   Insulin Infusion Pump Supplies (T:SLIM INSULIN CARTRIDGE ) MISC, Use with insulin pump, fill  once every 2 days., Disp: 5 each, Rfl: 3   insulin lispro (HUMALOG) 100 UNIT/ML injection, INJECT SUBCUTANEOUSLY VIA  INSULIN PUMP TOTAL OF 120 UNITS  DAILY, Disp: 110 mL, Rfl: 0   lidocaine (LIDODERM) 5 %, Place 1 patch onto the skin daily. Remove & Discard patch within 12 hours or as directed by MD, Disp: 30 patch, Rfl: 0   Multiple Vitamins-Minerals (CENTRUM SILVER PO), Take 1 tablet by mouth daily., Disp: , Rfl:    Omega-3 Fatty Acids (FISH OIL) 1200 MG CAPS, Take 1,200 mg by mouth daily., Disp: , Rfl:    rosuvastatin (CRESTOR) 40 MG tablet, TAKE 1 TABLET BY MOUTH  AT  BEDTIME, Disp: 90 tablet, Rfl: 3   vitamin B-12 (CYANOCOBALAMIN) 1000 MCG tablet, Take 1,000 mcg by mouth daily., Disp: , Rfl:    amLODipine (NORVASC) 2.5 MG tablet, Take 1 tablet (2.5 mg total) by mouth daily., Disp: 90 tablet, Rfl: 3   Objective:     BP 138/68 (BP Location: Right Arm, Patient Position: Sitting, Cuff Size: Normal)   Pulse 84   Temp 98 F (36.7 C)   Ht 6' (1.829 m)   Wt 258 lb 6.4 oz (117.2 kg)   SpO2 99%   BMI 35.05 kg/m    Physical Exam Constitutional:      General: He is not in acute distress.    Appearance: Normal appearance. He is not ill-appearing, toxic-appearing or diaphoretic.  HENT:     Head: Normocephalic and atraumatic.     Right Ear: External ear normal.     Left Ear: External ear normal.  Eyes:     General: No scleral icterus.       Right eye: No discharge.        Left eye: No discharge.     Conjunctiva/sclera: Conjunctivae normal.  Cardiovascular:     Rate and Rhythm: Normal rate and regular rhythm.  Pulmonary:     Effort: Pulmonary effort is normal. No respiratory distress.     Breath sounds: Normal breath sounds.  Abdominal:     General: Bowel sounds are normal.     Tenderness: There is no abdominal tenderness. There is no right CVA tenderness, left CVA tenderness, guarding or rebound.  Musculoskeletal:     Cervical back: No rigidity or tenderness.  Skin:    General: Skin is warm and dry.  Neurological:     Mental Status: He is alert and oriented to person, place, and time.  Psychiatric:        Mood and Affect: Mood normal.        Behavior: Behavior normal.      No results found for any visits on 11/22/23.    The ASCVD Risk score (Arnett DK, et al., 2019) failed to calculate for the following reasons:   The 2019 ASCVD risk score is only valid for ages 73 to 67    Assessment & Plan:   Dysuria -     Urine Culture  Essential hypertension -     amLODIPine Besylate; Take 1 tablet (2.5 mg total) by mouth daily.   Dispense: 90 tablet; Refill: 3    Return if symptoms worsen or fail to improve.  Urinalysis showed concentrated urine with 1+ leukocyte esterase and negative nitrites.  Will send culture.  Has follow-up with urology on Wednesday.  Restart amlodipine 2.5 mg daily.   Mliss Sax, MD

## 2023-11-23 LAB — URINE CULTURE
MICRO NUMBER:: 16020061
Result:: NO GROWTH
SPECIMEN QUALITY:: ADEQUATE

## 2023-11-26 ENCOUNTER — Encounter: Payer: Self-pay | Admitting: Family Medicine

## 2023-11-29 ENCOUNTER — Ambulatory Visit (INDEPENDENT_AMBULATORY_CARE_PROVIDER_SITE_OTHER): Payer: Medicare Other | Admitting: Podiatry

## 2023-11-29 ENCOUNTER — Encounter: Payer: Self-pay | Admitting: Podiatry

## 2023-11-29 VITALS — Ht 72.0 in | Wt 258.0 lb

## 2023-11-29 DIAGNOSIS — M79675 Pain in left toe(s): Secondary | ICD-10-CM | POA: Diagnosis not present

## 2023-11-29 DIAGNOSIS — Z794 Long term (current) use of insulin: Secondary | ICD-10-CM

## 2023-11-29 DIAGNOSIS — B351 Tinea unguium: Secondary | ICD-10-CM | POA: Insufficient documentation

## 2023-11-29 DIAGNOSIS — M79674 Pain in right toe(s): Secondary | ICD-10-CM

## 2023-11-29 DIAGNOSIS — E1142 Type 2 diabetes mellitus with diabetic polyneuropathy: Secondary | ICD-10-CM

## 2023-11-29 NOTE — Progress Notes (Signed)
 This patient returns to my office for at risk foot care.  This patient requires this care by a professional since this patient will be at risk due to having diabetes.  This patient is unable to cut nails himself since the patient cannot reach his nails.These nails are painful walking and wearing shoes.  This patient presents for at risk foot care today.  General Appearance  Alert, conversant and in no acute stress.  Vascular  Dorsalis pedis and posterior tibial  pulses are palpable  bilaterally.  Capillary return is within normal limits  bilaterally. Temperature is within normal limits  bilaterally.  Neurologic  Senn-Weinstein monofilament wire test within normal limits  bilaterally. Muscle power within normal limits bilaterally.  Nails Thick disfigured discolored nails with subungual debris  hallux nails bilaterally. No evidence of bacterial infection or drainage bilaterally.  Orthopedic  No limitations of motion  feet .  No crepitus or effusions noted.  No bony pathology or digital deformities noted.  Skin  normotropic skin with no porokeratosis noted bilaterally.  No signs of infections or ulcers noted.     Onychomycosis  Pain in right toes  Pain in left toes  Consent was obtained for treatment procedures.   Mechanical debridement of nails 1-5  bilaterally performed with a nail nipper.  Filed with dremel without incident.    Return office visit     prn                Told patient to return for periodic foot care and evaluation due to potential at risk complications.   Cordella Bold DPM

## 2023-11-30 ENCOUNTER — Ambulatory Visit: Payer: Medicare Other | Attending: Internal Medicine | Admitting: Internal Medicine

## 2023-11-30 ENCOUNTER — Encounter: Payer: Self-pay | Admitting: Internal Medicine

## 2023-11-30 VITALS — BP 130/68 | HR 87 | Ht 73.0 in | Wt 257.0 lb

## 2023-11-30 DIAGNOSIS — G4733 Obstructive sleep apnea (adult) (pediatric): Secondary | ICD-10-CM

## 2023-11-30 DIAGNOSIS — T466X5A Adverse effect of antihyperlipidemic and antiarteriosclerotic drugs, initial encounter: Secondary | ICD-10-CM

## 2023-11-30 DIAGNOSIS — E782 Mixed hyperlipidemia: Secondary | ICD-10-CM | POA: Diagnosis not present

## 2023-11-30 DIAGNOSIS — I2510 Atherosclerotic heart disease of native coronary artery without angina pectoris: Secondary | ICD-10-CM

## 2023-11-30 DIAGNOSIS — I700 Atherosclerosis of aorta: Secondary | ICD-10-CM | POA: Diagnosis not present

## 2023-11-30 DIAGNOSIS — I77810 Thoracic aortic ectasia: Secondary | ICD-10-CM

## 2023-11-30 DIAGNOSIS — M791 Myalgia, unspecified site: Secondary | ICD-10-CM

## 2023-11-30 DIAGNOSIS — I10 Essential (primary) hypertension: Secondary | ICD-10-CM

## 2023-11-30 NOTE — Patient Instructions (Signed)
 Medication Instructions:  Your physician has recommended you make the following change in your medication:  REMOVED: amlodipine  (Norvasc ) from your medication list; Please monitor your BP as discussed with Dr. Santo.   *If you need a refill on your cardiac medications before your next appointment, please call your pharmacy*   Lab Work: Fasting lipid panel, ALT (nothing to eat or drink 12 hours prior except water)  If you have labs (blood work) drawn today and your tests are completely normal, you will receive your results only by: MyChart Message (if you have MyChart) OR A paper copy in the mail If you have any lab test that is abnormal or we need to change your treatment, we will call you to review the results.   Testing/Procedures: Your physician has requested that you have an echocardiogram. Echocardiography is a painless test that uses sound waves to create images of your heart. It provides your doctor with information about the size and shape of your heart and how well your heart's chambers and valves are working. This procedure takes approximately one hour. There are no restrictions for this procedure. Please do NOT wear cologne, perfume, aftershave, or lotions (deodorant is allowed). Please arrive 15 minutes prior to your appointment time.  Please note: We ask at that you not bring children with you during ultrasound (echo/ vascular) testing. Due to room size and safety concerns, children are not allowed in the ultrasound rooms during exams. Our front office staff cannot provide observation of children in our lobby area while testing is being conducted. An adult accompanying a patient to their appointment will only be allowed in the ultrasound room at the discretion of the ultrasound technician under special circumstances. We apologize for any inconvenience.    Follow-Up: At [facility], you and your health needs are our priority.  As part of our continuing mission  to provide you with exceptional heart care, we have created designated Provider Care Teams.  These Care Teams include your primary Cardiologist (physician) and Advanced Practice Providers (APPs -  Physician Assistants and Nurse Practitioners) who all work together to provide you with the care you need, when you need it.  Your next appointment:   1 year(s)  Provider:   Stanly DELENA Santo, MD

## 2023-11-30 NOTE — Progress Notes (Signed)
 Cardiology Office Note:  .    Date:  11/30/2023  ID:  [name], DOB 25-Jun-1941, MRN [id number] PCP: Berneta Elsie Sayre, MD  [facility] Providers Cardiologist:  Stanly DELENA Leavens, MD     CC:  Follow up CAC  History of Present Illness: .    [name] is a 83 y.o. male with a history of type one diabetes, hypertension, hyperlipidemia, morbid obesity, and CKD stage 3A who presents for a cardiology follow-up.  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 No chest pain, shortness of breath, or palpitations. He feels generally well, though he is sleepy due to being woken up by his son's dog the previous night.  He has a history of obstructive sleep apnea for which he uses CPAP. He feels good overall, with no recent breathing issues or swelling. He is currently asymptomatic regarding his cardiovascular status.  He has hyperlipidemia and is currently on a PCSK9 inhibitor due to statin myalgia.  He is not currently experiencing any symptoms related to his hyperlipidemia treatment.  He is currently taking doxycycline for a urinary tract infection, which has made him feel grumpy and grouchy. He is not taking amlodipine  as his blood pressure is well controlled, with recent readings around 130/68 mmHg.  He has a family history of abdominal aortic aneurysm, as his son recently underwent surgery for an abdominal aortic aneurysm. This family history is significant given his own mild aortic dilation.  Relevant histories: .  Social  2022: Had benign syncope work up. 2023: Received notes from Dr. Jerrye.  Creatinine now 1.84.  Now on 20 mg Twice per week. 2024: Son had AAA endovascular intervention  ROS: As per HPI.   Studies Reviewed: .   Cardiac Studies & Procedures      ECHOCARDIOGRAM  ECHOCARDIOGRAM COMPLETE 05/18/2021  Narrative ECHOCARDIOGRAM REPORT    Patient Name:   [name]     Date of  Exam: 05/18/2021 Medical Rec #:  [id number]     Height:       72.0 in Accession #:    [id number]    Weight:       262.0 lb Date of Birth:  [birth year]    BSA:          2.390 m Patient Age:    79 years      BP:           141/79 mmHg Patient Gender: M             HR:           86 bpm. 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 Risk Factors:Sleep Apnea, Hypertension and HLD.  Sonographer:    Waldo Guadalajara RCS Referring Phys: [id number] Nevyn Bossman A Marina Boerner  IMPRESSIONS   1. Left ventricular ejection fraction, by estimation, is 55 to 60%. The left ventricle has normal function. The left ventricle has no regional wall motion abnormalities. There is mild concentric left ventricular hypertrophy. Left ventricular diastolic parameters are consistent with Grade I diastolic dysfunction (impaired relaxation). 2. Right ventricular systolic function is normal. The right ventricular size is normal. Tricuspid regurgitation signal is inadequate for assessing PA pressure. 3. The mitral valve is normal in structure.  No evidence of mitral valve regurgitation. 4. The aortic valve is tricuspid. Aortic valve regurgitation is not visualized. No aortic stenosis is present. 5. The inferior vena cava is normal in size with greater than 50% respiratory variability, suggesting right atrial pressure of 3 mmHg.  Comparison(s): A prior study was performed on 10/17/2020. No significant change from prior study. Prior images reviewed side by side.  FINDINGS Left Ventricle: Left ventricular ejection fraction, by estimation, is 55 to 60%. The left ventricle has normal function. The left ventricle has no regional wall motion abnormalities. The left ventricular internal cavity size was small. There is  mild concentric left ventricular hypertrophy. Left ventricular diastolic parameters are consistent with Grade I diastolic dysfunction (impaired relaxation).  Right Ventricle: The right ventricular size is normal. Right vetricular wall thickness was not well visualized. Right ventricular systolic function is normal. Tricuspid regurgitation signal is inadequate for assessing PA pressure.  Left Atrium: Left atrial size was normal in size.  Right Atrium: Right atrial size was normal in size.  Pericardium: There is no evidence of pericardial effusion.  Mitral Valve: The mitral valve is normal in structure. No evidence of mitral valve regurgitation.  Tricuspid Valve: The tricuspid valve is grossly normal. Tricuspid valve regurgitation is not demonstrated. No evidence of tricuspid stenosis.  Aortic Valve: The aortic valve is tricuspid. Aortic valve regurgitation is not visualized. No aortic stenosis is present.  Pulmonic Valve: The pulmonic valve was not well visualized. Pulmonic valve regurgitation is not visualized. No evidence of pulmonic stenosis.  Aorta: The aortic root and ascending aorta are structurally normal, with no evidence of dilitation.  Venous: The inferior vena cava is normal in size with greater than 50% respiratory variability, suggesting right atrial pressure of 3 mmHg.  IAS/Shunts: The atrial septum is grossly normal.   LEFT VENTRICLE PLAX 2D LVIDd:         3.60 cm  Diastology LVIDs:         2.60 cm  LV e' medial:    3.15 cm/s LV PW:         1.10 cm  LV E/e' medial:  15.4 LV IVS:        1.10 cm  LV e' lateral:   5.33 cm/s LVOT diam:     2.00 cm  LV E/e' lateral: 9.1 LV SV:         51 LV SV Index:   21 LVOT Area:     3.14 cm   RIGHT VENTRICLE RV Basal diam:  2.90 cm RV S prime:     12.40 cm/s TAPSE (M-mode): 1.8 cm  LEFT ATRIUM             Index       RIGHT ATRIUM           Index LA diam:        3.40 cm 1.42 cm/m  RA Area:     12.40 cm LA Vol (A2C):   49.1  ml 20.55 ml/m RA Volume:   27.30 ml  11.42 ml/m LA Vol (A4C):   26.1 ml 10.92 ml/m LA Biplane Vol: 36.9 ml 15.44 ml/m AORTIC VALVE LVOT Vmax:   90.70 cm/s LVOT Vmean:  56.400 cm/s LVOT VTI:    0.162 m  AORTA Ao Root diam: 3.30 cm Ao Asc diam:  3.60 cm  MITRAL VALVE SHUNTS MV Decel Time:             Systemic VTI:  0.16 m MV E velocity: 48.40 cm/s  Systemic Diam: 2.00 cm MV A velocity: 86.10 cm/s MV E/A ratio:  0.56  Stanly Leavens MD Electronically signed by Stanly Leavens MD Signature Date/Time: 05/18/2021/1:38:09 PM    Final   MONITORS  CARDIAC EVENT MONITOR 11/03/2020  Narrative  Patient had a minimum heart rate of 59 bpm, maximum heart rate of 136 bpm, and average heart rate of 76 bpm.  Predominant underlying rhythm was sinus rhythm.  Isolated PACs were rare (<1.0%).  Isolated PVCs were rare (<1.0%).  No evidence of complete heart block or atrial fibrillation.  Triggered and diary events associated with sinus rhythm and sinus tachycardia.  No malignant arrhythmias.             Physical Exam:    VS:  BP 130/68 (BP Location: Left Arm)   Pulse 87   Ht 6' 1 (1.854 m)   Wt 257 lb (116.6 kg)   SpO2 94%   BMI 33.91 kg/m    Wt Readings from Last 3 Encounters:  11/30/23 257 lb (116.6 kg)  11/29/23 258 lb (117 kg)  11/22/23 258 lb 6.4 oz (117.2 kg)    Gen: no distress  HEENT: R eye does not fully open Ears:  Dempsey Sign Cardiac: No Rubs or Gallops, no murmur, RRR +2 radial pulses Respiratory: Clear to auscultation bilaterally, normal effort, normal  respiratory rate GI: Soft, nontender, non-distended  MS: No  edema;  moves all extremities Integument: Skin feels warm Neuro:  At time of evaluation, alert and oriented to person/place/time/situation  Psych: Normal affect, patient feels well   ASSESSMENT AND PLAN: .    Aortic Dilation Mild aortic dilation noted. Family history of abdominal aortic aneurysm (AAA) in son who recently had  surgery. Aorta is within normal limits for age and body surface area but mildly dilated compared to all comers. Patient prefers conservative monitoring unless symptoms arise. - Order echocardiogram - Monitor blood pressure at home weekly - Reassess blood pressure during echocardiogram visit  Hypertension Blood pressure well controlled at 130/68 mmHg. Patient not taking amlodipine . If home blood pressure readings remain controlled, no need to restart amlodipine . If readings are high, amlodipine  will be resumed. 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 Currently on PCSK9 inhibitor. Plan to check fasting lipids and ALT to monitor cholesterol levels and liver function. New medication expected to significantly reduce cholesterol levels, potentially allowing for a reduction in other medications. - Order fasting lipids and ALT within the next couple of weeks  General Health Maintenance Overall doing well with no chest pain, dyspnea, or symptomatic heart conditions. - Annual follow-up visit - OSA- continue therapy   Stanly Leavens, MD FASE [facility] Cardiologist [facility]  [address] [place], [address] [place], [place] [address] [phone]  1:18 PM

## 2023-12-03 ENCOUNTER — Encounter: Payer: Self-pay | Admitting: "Endocrinology

## 2023-12-03 ENCOUNTER — Ambulatory Visit: Payer: Medicare Other | Admitting: "Endocrinology

## 2023-12-03 VITALS — BP 138/72 | HR 83 | Resp 18 | Ht 73.0 in | Wt 255.7 lb

## 2023-12-03 DIAGNOSIS — E782 Mixed hyperlipidemia: Secondary | ICD-10-CM | POA: Diagnosis not present

## 2023-12-03 DIAGNOSIS — E1165 Type 2 diabetes mellitus with hyperglycemia: Secondary | ICD-10-CM

## 2023-12-03 DIAGNOSIS — Z794 Long term (current) use of insulin: Secondary | ICD-10-CM | POA: Diagnosis not present

## 2023-12-03 DIAGNOSIS — E1140 Type 2 diabetes mellitus with diabetic neuropathy, unspecified: Secondary | ICD-10-CM

## 2023-12-03 DIAGNOSIS — Z9641 Presence of insulin pump (external) (internal): Secondary | ICD-10-CM

## 2023-12-03 LAB — POCT GLYCOSYLATED HEMOGLOBIN (HGB A1C): Hemoglobin A1C: 6.6 % — AB (ref 4.0–5.6)

## 2023-12-03 NOTE — Patient Instructions (Signed)

## 2023-12-03 NOTE — Progress Notes (Signed)
 [facility] Note  [name] [birth year] [id number]  Referring Provider: @REFPROVFULL @ Primary Care Provider: Tonna Frederic, MD Reason for consultation: No chief complaint on file.   Assessment & Plan Diagnoses and all orders for this visit:  Type 2 diabetes mellitus with diabetic neuropathy, with long-term current use of insulin  (HCC) -     POCT glycosylated hemoglobin (Hb A1C) -     Lipid panel  Uncontrolled type 2 diabetes mellitus with hyperglycemia (HCC)  Insulin  pump in place  Mixed hypercholesterolemia and hypertriglyceridemia   Type 2 diabetes mellitus Hba1c goal less than 7.0, current Hba1c is 6.9. Will recommend:  Insulin  Pump Settings: recommend as follows: HUMALOG  Insulin  with pump: T-slim control IQ, DexCom G6  BASAL settings: 12-8 am = 1.65, 8-12 noon= 1.8, Noon to 12 MN = 1.75 Carb 1:3 (1:25 from noon-5pm)  correction factor 1: 25>100 Active insulin  5 hours (on Control IQ)  Bolus 5 min before each meal   -check blood sugars before each meals and at bedtime -enter blood sugars in the pump and calibrate three times a day. Calibrate when fasting and at bedtime and one calibration before the meal -avoid calibrations when the blood sugar is rising rapidly or falling -pre bolus carbs when eating. Pre bolus needs to be 5 minutes before the meal -do not enter false or fake carbohydrates -do not enter carbohydratess after you eat and try to catch up with a high blood sugar -change sets on time to have consistent insulin  absorption and prevent scar formation -call the office /answering service for blood sugar questions regarding hyper or hypoglycemia or sensor and transmitter issues -insulin  settings updated and scanned in chart  No known contraindications to any of above medications  Hyperlipidemia -Last LDL off goal: 94 -on rosuvastatin  40 mg every day, fish oil, repatha  140 mg q14 days -Follow low fat diet and exercise, pt likes  fried foods  -ordered repeat lab   -Blood pressure goal <140/90 - Microalbumin/creatinine goal < 30 -on losartan 25 mg every day -diet changes including salt restriction -limit eating outside -counseled BP targets per standards of diabetes care -Uncontrolled blood pressure can lead to retinopathy, nephropathy and cardiovascular and atherosclerotic heart disease  Reviewed and counseled on: -A1C target -Blood sugar targets -Complications of uncontrolled diabetes  -Checking blood sugar before meals and bedtime and bring log next visit -All medications with mechanism of action and side effects -Hypoglycemia management: rule of 15's, Glucagon Emergency Kit and medical alert ID -low-carb low-fat plate-method diet -At least 20 minutes of physical activity per day -Annual dilated retinal eye exam and foot exam -compliance and follow up needs -follow up as scheduled or earlier if problem gets worse  Call if blood sugar is less than 70 or consistently above 250    Take a 15 gm snack of carbohydrate at bedtime before you go to sleep if your blood sugar is less than 100.    If you are going to fast after midnight for a test or procedure, ask your physician for instructions on how to reduce/decrease your insulin  dose.    Call if blood sugar is less than 70 or consistently above 250  -Treating a low sugar by rule of 15  (15 gms of sugar every 15 min until sugar is more than 70) If you feel your sugar is low, test your sugar to be sure If your sugar is low (less than 70), then take 15 grams of a fast acting Carbohydrate (3-4  glucose tablets or glucose gel or 4 ounces of juice or regular soda) Recheck your sugar 15 min after treating low to make sure it is more than 70 If sugar is still less than 70, treat again with 15 grams of carbohydrate          Don't drive the hour of hypoglycemia  If unconscious/unable to eat or drink by mouth, use glucagon injection or nasal spray baqsimi and call 911.  Can repeat again in 15 min if still unconscious.    I have reviewed current medications, nurse's notes, allergies, vital signs, past medical and surgical history, family medical history, and social history for this encounter. Counseled patient on symptoms, examination findings, lab findings, imaging results, treatment decisions and monitoring and prognosis. The patient understood the recommendations and agrees with the treatment plan. All questions regarding treatment plan were fully answered.  Return in about 3 months (around 03/01/2024) for visit, labs today.   Jorge Newcomer, MD 12/03/23   History of Present Illness [name] is a 83 y.o. year old male who presents to our clinic for a follow up for Type 2 diabetes mellitus. [name] was first diagnosed in 1986.   Previous history: Insulin  was started in 1995  Recent history:     Non-insulin  hypoglycemic drugs: None     Insulin  pump: T-slim control IQ       BASAL settings: 12-8 am = 1.65, 8-12 noon= 1.8, Noon to 12 MN = 1.75 Carb Carb 1:3 (1:25 from noon-5pm)  Active insulin  5 hours, correction factor 1:25>100  Ambulatory BG Monitoring  CGM interpretation: At today's visit, we reviewed her CGM downloads. The full report is scanned in the media. Reviewing the CGM trends, Bg trend high after meals.    Insulin  pump  Pump downloaded and reviewed. Blood sugars reviewed Basal rates reviewed Auto mode % reviewed Manual mode % reviewed Sensor wear % reviewed Readings above target % reviewed Readings at target % reviewed Readings below target % reviewed TDD units of insulin  /day reviewed Changing sets on time reviewed Calibration reviewed Carb counting reviewed Basal bolus ratio reviewed Basal % reviewed Bolus % reviewed Duration of pump suspends reviewed No reaction at insulin  pump sites Set change=every 3 days Reservoir change = every 3 days   Physical Exam  BP 138/72   Pulse 83   Resp 18   Ht 6\' 1"  (1.854 m)    Wt 255 lb 11.2 oz (116 kg)   SpO2 96%   BMI 33.74 kg/m    Constitutional: well developed, well nourished Head: normocephalic, atraumatic Eyes: sclera anicteric, no redness Neck: supple Lungs: normal respiratory effort Neurology: alert and oriented Skin: dry, no appreciable rashes Musculoskeletal: no appreciable defects Psychiatric: normal mood and affect  Current Medications Patient's Medications  New Prescriptions   No medications on file  Previous Medications   ACCU-CHEK FASTCLIX LANCETS MISC    USE TO CHECK BLOOD SUGAR UP TO 6 TIMES DAILY   ALFUZOSIN  (UROXATRAL ) 10 MG 24 HR TABLET    TAKE 1 TABLET BY MOUTH  DAILY WITH BREAKFAST   AMITRIPTYLINE  (ELAVIL ) 50 MG TABLET    TAKE ONE-HALF TABLET BY MOUTH AT BEDTIME   BLOOD GLUCOSE MONITORING SUPPL (ACCU-CHEK AVIVA PLUS) W/DEVICE KIT    Use as directed   CETIRIZINE  (ZYRTEC ) 10 MG TABLET    Take 10 mg by mouth daily as needed for allergies.   CONTINUOUS BLOOD GLUC SENSOR (DEXCOM G6 SENSOR) MISC    Inject 1 Device into the skin  as directed. Apply to skin SQ every 10 days   DICLOFENAC  SODIUM (VOLTAREN ) 1 % GEL    Apply 2 g topically 4 (four) times daily.   DOXYCYCLINE HYCLATE 200 MG TBEC    Take by mouth in the morning and at bedtime.   EVOLOCUMAB  (REPATHA  SURECLICK) 140 MG/ML SOAJ    Inject 140 mg into the skin every 14 (fourteen) days.   FLUTICASONE  (FLONASE) 50 MCG/ACT NASAL SPRAY    Place 1 spray into the nose daily as needed for allergies.    FOLIC ACID  (FOLVITE ) 1 MG TABLET    Take 1 mg by mouth daily.   FUROSEMIDE  (LASIX ) 20 MG TABLET    TAKE 1 TABLET BY MOUTH  DAILY   GLUCOSE BLOOD (ACCU-CHEK AVIVA PLUS) TEST STRIP    USE TO TEST BLOOD SUGAR 6  TIMES PER DAY; E11.9   INSULIN  INFUSION PUMP SUPPLIES (AUTOSOFT XC INFUSION SET) MISC    Inject 1 Act into the skin every other day. Use with 9mm cannula and 23 inch tubing. *5 boxes (90 day supply)   INSULIN  INFUSION PUMP SUPPLIES (T:SLIM INSULIN  CARTRIDGE ) MISC    Use with insulin   pump, fill once every 2 days.   INSULIN  LISPRO (HUMALOG ) 100 UNIT/ML INJECTION    INJECT SUBCUTANEOUSLY VIA  INSULIN  PUMP TOTAL OF 120 UNITS  DAILY   LIDOCAINE  (LIDODERM ) 5 %    Place 1 patch onto the skin daily. Remove & Discard patch within 12 hours or as directed by MD   MULTIPLE VITAMINS-MINERALS (CENTRUM SILVER PO)    Take 1 tablet by mouth daily.   OMEGA-3 FATTY ACIDS (FISH OIL) 1200 MG CAPS    Take 1,200 mg by mouth daily.   PHENAZOPYRIDINE (PYRIDIUM) 200 MG TABLET    Take 200 mg by mouth 3 (three) times daily as needed.   ROSUVASTATIN  (CRESTOR ) 40 MG TABLET    TAKE 1 TABLET BY MOUTH AT  BEDTIME   VITAMIN B-12 (CYANOCOBALAMIN ) 1000 MCG TABLET    Take 1,000 mcg by mouth daily.  Modified Medications   No medications on file  Discontinued Medications   No medications on file    Allergies Allergies  Allergen Reactions   Atorvastatin Calcium     Lipitor [Atorvastatin] Other (See Comments)    unknown   Niacin     REACTION: Severe heartburn   Pioglitazone     REACTION: Edema    Past Medical History Past Medical History:  Diagnosis Date   Allergy    Anal fissure    Anemia, unspecified    Arthritis    Spinal OA   BACK PAIN, LUMBAR 10/23/2007   CARDIAC MURMUR, SYSTOLIC 10/27/2008   DIABETES MELLITUS, TYPE I 05/20/2007   DIASTOLIC DYSFUNCTION 11/04/2008   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HCC)    Duodenitis    Edema 05/12/2008   Esophageal stricture    GERD (gastroesophageal reflux disease)    GOUT 05/20/2007   Gynecomastia    Hepatic steatosis    HYPERLIPIDEMIA 05/20/2007   Hypertension    Hypogonadism male    Morbid obesity (HCC) 09/10/2009   OBSTRUCTIVE SLEEP APNEA 11/04/2007   Personal history of colonic polyps    RENAL INSUFFICIENCY 05/12/2008   Sleep apnea    uses cpap   Squamous cell carcinoma of skin 02/16/2020   left lower leg, anterior-cx3,75fu   Urolithiasis     Past Surgical History Past Surgical History:  Procedure Laterality Date   Abdominal  US   09/1997  arm fracture Left 1958   with hardware   Colon cancer screening     COLONOSCOPY  08/18/2004   diverticulitis   COLONOSCOPY  09/24/2009   ELECTROCARDIOGRAM  02/2006   FLEXIBLE SIGMOIDOSCOPY  03/04/2001   polyps, anal fissure   GANGLION CYST EXCISION Left 1980   HIP PINNING,CANNULATED Right 04/25/2021   Procedure: CANNULATED HIP PINNING;  Surgeon: Adonica Hoose, MD;  Location: [facility];  Service: Orthopedics;  Laterality: Right;   Lower Arterial  04/13/2004   POLYPECTOMY     Rest Cardiolite  03/19/2003    Family History family history includes Colon cancer (age of onset: 51) in his brother; Colon polyps in his brother; Healthy in his daughter and son; Lung cancer in his brother; Other in his mother.  Social History Social History   Socioeconomic History   Marital status: Married    Spouse name: Not on file   Number of children: Not on file   Years of education: Not on file   Highest education level: Not on file  Occupational History    Employer: [employer]  Tobacco Use   Smoking status: Former    Current packs/day: 0.00    Average packs/day: 2.0 packs/day for 31.0 years (62.0 ttl pk-yrs)    Types: Cigarettes    Start date: 61    Quit date: 10/23/1982    Years since quitting: 41.1   Smokeless tobacco: Never  Vaping Use   Vaping status: Never Used  Substance and Sexual Activity   Alcohol  use: No    Alcohol /week: 0.0 standard drinks of alcohol    Drug use: No   Sexual activity: Not on file  Other Topics Concern   Not on file  Social History Narrative   Lives with wife in a one story home.  Has a son and a daughter.     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     Education: 2 years of college.   Social Drivers of Corporate investment banker Strain: Low Risk  (01/26/2023)   Overall Financial Resource Strain (CARDIA)    Difficulty of Paying Living Expenses: Not hard at all  Food Insecurity: No Food Insecurity (01/26/2023)   Hunger Vital  Sign    Worried About Running Out of Food in the Last Year: Never true    Ran Out of Food in the Last Year: Never true  Transportation Needs: No Transportation Needs (01/26/2023)   PRAPARE - Administrator, Civil Service (Medical): No    Lack of Transportation (Non-Medical): No  Physical Activity: Inactive (01/26/2023)   Exercise Vital Sign    Days of Exercise per Week: 0 days    Minutes of Exercise per Session: 0 min  Stress: No Stress Concern Present (01/26/2023)   [employer] of Occupational Health - Occupational Stress Questionnaire    Feeling of Stress : Not at all  Social Connections: Socially Integrated (01/10/2022)   Social Connection and Isolation Panel [NHANES]    Frequency of Communication with Friends and Family: Twice a week    Frequency of Social Gatherings with Friends and Family: Twice a week    Attends Religious Services: 1 to 4 times per year    Active Member of [employer] or Organizations: Yes    Attends Banker Meetings: 1 to 4 times per year    Marital Status: Married  Catering manager Violence: Not At Risk (01/10/2022)   Humiliation, Afraid, Rape, and Kick questionnaire    Fear of  Current or Ex-Partner: No    Emotionally Abused: No    Physically Abused: No    Sexually Abused: No    Lab Results  Component Value Date   HGBA1C 6.6 (A) 12/03/2023   Lab Results  Component Value Date   CHOL 161 06/13/2023   Lab Results  Component Value Date   HDL 38 (L) 06/13/2023   Lab Results  Component Value Date   LDLCALC 94 06/13/2023   Lab Results  Component Value Date   TRIG 164 (H) 06/13/2023   Lab Results  Component Value Date   CHOLHDL 4.2 06/13/2023   Lab Results  Component Value Date   CREATININE 1.75 (H) 05/23/2023   Lab Results  Component Value Date   MICROALBUR 7.5 (H) 12/15/2022    Parts of this note may have been dictated using voice recognition software. There may be variances in spelling and vocabulary which are  unintentional. Not all errors are proofread. Please notify the [facility] if any discrepancies are noted or if the meaning of any statement is not clear.

## 2023-12-04 ENCOUNTER — Encounter: Payer: Self-pay | Admitting: "Endocrinology

## 2023-12-04 LAB — LIPID PANEL
Cholesterol: 117 mg/dL
HDL: 40 mg/dL
LDL Cholesterol (Calc): 52 mg/dL
Non-HDL Cholesterol (Calc): 77 mg/dL
Total CHOL/HDL Ratio: 2.9 (calc)
Triglycerides: 168 mg/dL — ABNORMAL HIGH

## 2023-12-14 ENCOUNTER — Other Ambulatory Visit: Payer: Self-pay

## 2023-12-14 MED ORDER — AUTOSOFT XC INFUSION SET MISC: 1.0000 | 6 refills | Status: DC

## 2023-12-17 ENCOUNTER — Other Ambulatory Visit: Payer: Self-pay

## 2023-12-17 MED ORDER — AUTOSOFT XC INFUSION SET MISC: 6 refills | Status: AC

## 2023-12-18 ENCOUNTER — Encounter: Payer: Self-pay | Admitting: Internal Medicine

## 2023-12-18 LAB — ALT: ALT: 16 [IU]/L (ref 0–44)

## 2023-12-18 LAB — LIPID PANEL
Chol/HDL Ratio: 3.8 {ratio} (ref 0.0–5.0)
Cholesterol, Total: 136 mg/dL (ref 100–199)
HDL: 36 mg/dL — ABNORMAL LOW (ref >39)
LDL Chol Calc (NIH): 73 mg/dL (ref 0–99)
Triglycerides: 154 mg/dL — ABNORMAL HIGH (ref 0–149)
VLDL Cholesterol Cal: 27 mg/dL (ref 5–40)

## 2023-12-20 ENCOUNTER — Ambulatory Visit (HOSPITAL_COMMUNITY): Payer: Medicare Other | Attending: Cardiovascular Disease

## 2023-12-20 DIAGNOSIS — E782 Mixed hyperlipidemia: Secondary | ICD-10-CM | POA: Insufficient documentation

## 2023-12-20 DIAGNOSIS — I700 Atherosclerosis of aorta: Secondary | ICD-10-CM | POA: Insufficient documentation

## 2023-12-20 DIAGNOSIS — I77810 Thoracic aortic ectasia: Secondary | ICD-10-CM | POA: Diagnosis present

## 2023-12-20 LAB — ECHOCARDIOGRAM COMPLETE
Area-P 1/2: 3.21 cm2
Calc EF: 61.6 %
S' Lateral: 2.22 cm
Single Plane A2C EF: 60.9 %
Single Plane A4C EF: 62.4 %

## 2023-12-21 ENCOUNTER — Other Ambulatory Visit: Payer: Self-pay

## 2023-12-21 DIAGNOSIS — E0800 Diabetes mellitus due to underlying condition with hyperosmolarity without nonketotic hyperglycemic-hyperosmolar coma (NKHHC): Secondary | ICD-10-CM

## 2023-12-21 MED ORDER — INSULIN LISPRO 100 UNIT/ML IJ SOLN
INTRAMUSCULAR | 0 refills | Status: DC
Start: 2023-12-21 — End: 2024-02-18

## 2024-01-18 ENCOUNTER — Encounter: Payer: Self-pay | Admitting: Internal Medicine

## 2024-01-28 ENCOUNTER — Ambulatory Visit (INDEPENDENT_AMBULATORY_CARE_PROVIDER_SITE_OTHER): Payer: Medicare Other

## 2024-01-28 VITALS — BP 124/60 | HR 100 | Temp 97.9°F | Ht 71.75 in | Wt 260.6 lb

## 2024-01-28 DIAGNOSIS — Z0000 Encounter for general adult medical examination without abnormal findings: Secondary | ICD-10-CM | POA: Diagnosis not present

## 2024-01-28 NOTE — Progress Notes (Signed)
 Subjective:   [name] is a 83 y.o. who presents for a Medicare Wellness preventive visit.  Visit Complete: In person    Persons Participating in Visit: Patient.  AWV Questionnaire: No: Patient Medicare AWV questionnaire was not completed prior to this visit.  Cardiac Risk Factors include: advanced age (>29men, >23 women);diabetes mellitus;dyslipidemia;hypertension;male gender     Objective:    Today's Vitals   01/28/24 1117 01/28/24 1118  BP: 124/60   Pulse: 100   Temp: 97.9 F (36.6 C)   TempSrc: Oral   SpO2: 96%   Weight: 260 lb 9.6 oz (118.2 kg)   Height: 5' 11.75" (1.822 m)   PainSc:  1    Body mass index is 35.59 kg/m.     01/28/2024   11:28 AM 01/26/2023    2:15 PM 01/10/2022    1:42 PM 04/23/2021   11:00 PM 04/23/2021    6:35 PM 01/04/2021    1:21 PM 12/31/2019   11:03 AM  Advanced Directives  Does Patient Have a Medical Advance Directive? Yes No Yes No No Yes No  Type of Advance Directive Living will  Healthcare Power of [place];Living will   Living will;Healthcare Power of Attorney   Does patient want to make changes to medical advance directive?    No - Patient declined     Copy of Healthcare Power of Attorney in Chart?   No - copy requested   No - copy requested   Would patient like information on creating a medical advance directive?    No - Patient declined   No - Patient declined    Current Medications (verified) Outpatient Encounter Medications as of 01/28/2024  Medication Sig   Accu-Chek FastClix Lancets MISC USE TO CHECK BLOOD SUGAR UP TO 6 TIMES DAILY   alfuzosin (UROXATRAL) 10 MG 24 hr tablet TAKE 1 TABLET BY MOUTH  DAILY WITH BREAKFAST   amitriptyline (ELAVIL) 50 MG tablet TAKE ONE-HALF TABLET BY MOUTH AT BEDTIME   Blood Glucose Monitoring Suppl (ACCU-CHEK AVIVA PLUS) w/Device KIT Use as directed   cetirizine (ZYRTEC) 10 MG tablet Take 10 mg by mouth daily as needed for allergies.   Continuous Blood Gluc Sensor (DEXCOM G6 SENSOR) MISC Inject 1  Device into the skin as directed. Apply to skin SQ every 10 days   diclofenac Sodium (VOLTAREN) 1 % GEL Apply 2 g topically 4 (four) times daily.   fluticasone (FLONASE) 50 MCG/ACT nasal spray Place 1 spray into the nose daily as needed for allergies.    folic acid (FOLVITE) 1 MG tablet Take 1 mg by mouth daily.   glucose blood (ACCU-CHEK AVIVA PLUS) test strip USE TO TEST BLOOD SUGAR 6  TIMES PER DAY; E11.9   Insulin Infusion Pump Supplies (AUTOSOFT XC INFUSION SET) MISC Use with 9mm cannula and 23 inch tubing. *5 boxes (90 day supply)  Change every 3 days   Insulin Infusion Pump Supplies (T:SLIM INSULIN CARTRIDGE ) MISC Use with insulin pump, fill once every 2 days.   insulin lispro (HUMALOG) 100 UNIT/ML injection INJECT SUBCUTANEOUSLY VIA  INSULIN PUMP TOTAL OF 120 UNITS  DAILY   lidocaine (LIDODERM) 5 % Place 1 patch onto the skin daily. Remove & Discard patch within 12 hours or as directed by MD   Multiple Vitamins-Minerals (CENTRUM SILVER PO) Take 1 tablet by mouth daily.   Omega-3 Fatty Acids (FISH OIL) 1200 MG CAPS Take 1,200 mg by mouth daily.   rosuvastatin (CRESTOR) 40 MG tablet TAKE 1 TABLET BY MOUTH  AT  BEDTIME   vitamin B-12 (CYANOCOBALAMIN) 1000 MCG tablet Take 1,000 mcg by mouth daily.   Doxycycline Hyclate 200 MG TBEC Take by mouth in the morning and at bedtime. (Patient not taking: Reported on 01/28/2024)   Evolocumab (REPATHA SURECLICK) 140 MG/ML SOAJ Inject 140 mg into the skin every 14 (fourteen) days.   furosemide (LASIX) 20 MG tablet TAKE 1 TABLET BY MOUTH  DAILY (Patient taking differently: Takes on Monday, Tuesday and Wednesday)   phenazopyridine (PYRIDIUM) 200 MG tablet Take 200 mg by mouth 3 (three) times daily as needed. (Patient not taking: Reported on 01/28/2024)   No facility-administered encounter medications on file as of 01/28/2024.    Allergies (verified) Atorvastatin calcium, Lipitor [atorvastatin], Niacin, and Pioglitazone   History: Past Medical History:   Diagnosis Date   Allergy    Anal fissure    Anemia, unspecified    Arthritis    Spinal OA   BACK PAIN, LUMBAR 10/23/2007   CARDIAC MURMUR, SYSTOLIC 10/27/2008   DIABETES MELLITUS, TYPE I 05/20/2007   DIASTOLIC DYSFUNCTION 11/04/2008   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HCC)    Duodenitis    Edema 05/12/2008   Esophageal stricture    GERD (gastroesophageal reflux disease)    GOUT 05/20/2007   Gynecomastia    Hepatic steatosis    HYPERLIPIDEMIA 05/20/2007   Hypertension    Hypogonadism male    Morbid obesity (HCC) 09/10/2009   OBSTRUCTIVE SLEEP APNEA 11/04/2007   Personal history of colonic polyps    RENAL INSUFFICIENCY 05/12/2008   Sleep apnea    uses cpap   Squamous cell carcinoma of skin 02/16/2020   left lower leg, anterior-[id number]   Urolithiasis    Past Surgical History:  Procedure Laterality Date   Abdominal US  09/1997   arm fracture Left 1958   with hardware   Colon cancer screening     COLONOSCOPY  08/18/2004   diverticulitis   COLONOSCOPY  09/24/2009   ELECTROCARDIOGRAM  02/2006   FLEXIBLE SIGMOIDOSCOPY  03/04/2001   polyps, anal fissure   GANGLION CYST EXCISION Left 1980   HIP PINNING,CANNULATED Right 04/25/2021   Procedure: CANNULATED HIP PINNING;  Surgeon: Samson Frederic, MD;  Location: [facility];  Service: Orthopedics;  Laterality: Right;   Lower Arterial  04/13/2004   POLYPECTOMY     Rest Cardiolite  03/19/2003   Family History  Problem Relation Age of Onset   Colon cancer Brother 42   Colon polyps Brother    Lung cancer Brother    Other Mother        MVA, deceased 68s   Healthy Daughter    Healthy Son    Rectal cancer Neg Hx    Stomach cancer Neg Hx    Social History   Socioeconomic History   Marital status: Married    Spouse name: Not on file   Number of children: Not on file   Years of education: Not on file   Highest education level: Not on file  Occupational History    Employer: [employer]  Tobacco Use   Smoking status:  Former    Current packs/day: 0.00    Average packs/day: 2.0 packs/day for 31.0 years (62.0 ttl pk-yrs)    Types: Cigarettes    Start date: 79    Quit date: 10/23/1982    Years since quitting: 41.2   Smokeless tobacco: Never  Vaping Use   Vaping status: Never Used  Substance and Sexual Activity   Alcohol use: No  Alcohol/week: 0.0 standard drinks of alcohol   Drug use: No   Sexual activity: Not on file  Other Topics Concern   Not on file  Social History Narrative   Lives with wife in a one story home.  Has a son and a daughter.     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     Education: 2 years of college.   Social Drivers of Corporate investment banker Strain: Low Risk  (01/28/2024)   Overall Financial Resource Strain (CARDIA)    Difficulty of Paying Living Expenses: Not hard at all  Food Insecurity: No Food Insecurity (01/28/2024)   Hunger Vital Sign    Worried About Running Out of Food in the Last Year: Never true    Ran Out of Food in the Last Year: Never true  Transportation Needs: No Transportation Needs (01/28/2024)   PRAPARE - Administrator, Civil Service (Medical): No    Lack of Transportation (Non-Medical): No  Physical Activity: Inactive (01/28/2024)   Exercise Vital Sign    Days of Exercise per Week: 0 days    Minutes of Exercise per Session: 0 min  Stress: No Stress Concern Present (01/28/2024)   [employer] of Occupational Health - Occupational Stress Questionnaire    Feeling of Stress : Not at all  Social Connections: Moderately Isolated (01/28/2024)   Social Connection and Isolation Panel [NHANES]    Frequency of Communication with Friends and Family: More than three times a week    Frequency of Social Gatherings with Friends and Family: Not on file    Attends Religious Services: Never    Database administrator or Organizations: No    Attends Engineer, structural: Never    Marital Status: Married    Tobacco  Counseling Counseling given: Not Answered    Clinical Intake:  Pre-visit preparation completed: Yes  Pain : 0-10 Pain Score: 1  Pain Type: Chronic pain Pain Location: Knee Pain Orientation: Left, Right Pain Descriptors / Indicators: Aching, Sharp Pain Onset: More than a month ago Pain Frequency: Constant     Nutritional Status: BMI > 30  Obese Nutritional Risks: None Diabetes: Yes CBG done?: No Did pt. bring in CBG monitor from home?: No  Lab Results  Component Value Date   HGBA1C 6.6 (A) 12/03/2023   HGBA1C 6.9 (A) 08/24/2023   HGBA1C 7.0 (A) 04/04/2023     How often do you need to have someone help you when you read instructions, pamphlets, or other written materials from your doctor or pharmacy?: 1 - Never  Interpreter Needed?: No  Information entered by :: NAllen LPN   Activities of Daily Living     01/28/2024   11:19 AM  In your present state of health, do you have any difficulty performing the following activities:  Hearing? 1  Comment does not wear his hearing aids  Vision? 1  Difficulty concentrating or making decisions? 1  Comment short term memory  Walking or climbing stairs? 1  Dressing or bathing? 0  Doing errands, shopping? 0  Preparing Food and eating ? N  Using the Toilet? N  In the past six months, have you accidently leaked urine? N  Do you have problems with loss of bowel control? N  Managing your Medications? N  Managing your Finances? N  Housekeeping or managing your Housekeeping? N    Patient Care Team: Mliss Sax, MD as PCP - General (Family Medicine) Christell Constant,  MD as PCP - Cardiology (Cardiology) Glendale Chard, DO as Consulting Physician (Neurology) Clance, Maree Krabbe, MD as Consulting Physician (Pulmonary Disease) Rodrigo Ran, OD (Ophthalmology) Glyn Ade, PA-C as Physician Assistant (Dermatology)  Indicate any recent Medical Services you may have received from other than [facility] providers  in the past year (date may be approximate).     Assessment:   This is a routine wellness examination for [place].  Hearing/Vision screen Hearing Screening - Comments:: Has hearing aids that we don't use often Vision Screening - Comments:: Regular eye exams,    Goals Addressed             This Visit's Progress    Patient Stated       01/28/2024, keep living       Depression Screen     01/28/2024   11:31 AM 10/29/2023    2:35 PM 06/26/2023    2:38 PM 05/23/2023    9:24 AM 03/21/2023    8:40 AM 01/26/2023    2:18 PM 01/18/2023   10:17 AM  PHQ 2/9 Scores  PHQ - 2 Score 0 0 0 0 0 0 0  PHQ- 9 Score 0          Fall Risk     01/28/2024   11:30 AM 10/29/2023    2:27 PM 06/26/2023    2:38 PM 05/23/2023    9:24 AM 03/21/2023    8:40 AM  Fall Risk   Falls in the past year? 0 0 0  0  Number falls in past yr: 0 0 0 0 0  Injury with Fall? 0  0 0 0  Risk for fall due to : Impaired vision;Medication side effect   No Fall Risks No Fall Risks  Follow up Falls prevention discussed;Falls evaluation completed   Falls evaluation completed Falls evaluation completed    MEDICARE RISK AT HOME:  Medicare Risk at Home Any stairs in or around the home?: No If so, are there any without handrails?: No Home free of loose throw rugs in walkways, pet beds, electrical cords, etc?: Yes Adequate lighting in your home to reduce risk of falls?: Yes Life alert?: No Use of a cane, walker or w/c?: No Grab bars in the bathroom?: Yes Shower chair or bench in shower?: Yes Elevated toilet seat or a handicapped toilet?: Yes  TIMED UP AND GO:  Was the test performed?  Yes  Length of time to ambulate 10 feet: 6 sec Gait slow and steady without use of assistive device  Cognitive Function: 6CIT completed        01/28/2024   11:32 AM 01/26/2023    2:19 PM 01/04/2021    1:36 PM  6CIT Screen  What Year? 0 points 0 points 0 points  What month? 0 points 0 points 0 points  What time? 3 points 0 points 0 points  Count  back from 20 0 points 0 points 0 points  Months in reverse 0 points 0 points 2 points  Repeat phrase 4 points 8 points 0 points  Total Score 7 points 8 points 2 points    Immunizations Immunization History  Administered Date(s) Administered   Fluad Quad(high Dose 65+) 07/10/2019   Influenza Split 07/23/2009, 06/23/2010   Influenza Whole 07/23/2009, 06/23/2010   Influenza, High Dose Seasonal PF 07/03/2017, 07/30/2018   Influenza,inj,Quad PF,6+ Mos 09/26/2013, 07/07/2014, 10/01/2015, 06/30/2016   Influenza-Unspecified 07/07/2021, 07/31/2022   PFIZER(Purple Top)SARS-COV-2 Vaccination 11/04/2019, 11/24/2019, 07/25/2020   Pneumococcal Conjugate-13 11/25/2014   Pneumococcal  Polysaccharide-23 02/21/1995, 03/20/2013   Td 02/20/1998, 10/27/2008   Td (Adult), 2 Lf Tetanus Toxid, Preservative Free 02/20/1998, 10/27/2008   Td (Adult),5 Lf Tetanus Toxid, Preservative Free 02/20/1998, 10/27/2008   Tdap 02/20/1998, 10/27/2008, 10/30/2021   Zoster Recombinant(Shingrix) 08/11/2021   Zoster, Live 07/07/2014    Screening Tests Health Maintenance  Topic Date Due   Zoster Vaccines- Shingrix (2 of 2) 10/06/2021   Diabetic kidney evaluation - Urine ACR  12/16/2023   OPHTHALMOLOGY EXAM  12/22/2023   Diabetic kidney evaluation - eGFR measurement  05/22/2024   INFLUENZA VACCINE  05/23/2024   HEMOGLOBIN A1C  06/01/2024   FOOT EXAM  08/29/2024   Medicare Annual Wellness (AWV)  01/27/2025   DTaP/Tdap/Td (10 - Td or Tdap) 10/31/2031   Pneumonia Vaccine 69+ Years old  Completed   HPV VACCINES  Aged Out   Colonoscopy  Discontinued   COVID-19 Vaccine  Discontinued    Health Maintenance  Health Maintenance Due  Topic Date Due   Zoster Vaccines- Shingrix (2 of 2) 10/06/2021   Diabetic kidney evaluation - Urine ACR  12/16/2023   OPHTHALMOLOGY EXAM  12/22/2023   Health Maintenance Items Addressed: Patient to make eye appointment. Will follow up with [facility] about second shingles  vaccine.  Additional Screening:  Vision Screening: Recommended annual ophthalmology exams for early detection of glaucoma and other disorders of the eye.  Dental Screening: Recommended annual dental exams for proper oral hygiene  Community Resource Referral / Chronic Care Management: CRR required this visit?  No   CCM required this visit?  No     Plan:     I have personally reviewed and noted the following in the patient's chart:   Medical and social history Use of alcohol, tobacco or illicit drugs  Current medications and supplements including opioid prescriptions. Patient is not currently taking opioid prescriptions. Functional ability and status Nutritional status Physical activity Advanced directives List of other physicians Hospitalizations, surgeries, and ER visits in previous 12 months Vitals Screenings to include cognitive, depression, and falls Referrals and appointments  In addition, I have reviewed and discussed with patient certain preventive protocols, quality metrics, and best practice recommendations. A written personalized care plan for preventive services as well as general preventive health recommendations were provided to patient.     Barb Merino, LPN   06/23/4781   After Visit Summary: (In Person-Printed) AVS printed and given to the patient  Notes: Nothing significant to report at this time.

## 2024-01-28 NOTE — Patient Instructions (Signed)
 [name] , Thank you for taking time to come for your Medicare Wellness Visit. I appreciate your ongoing commitment to your health goals. Please review the following plan we discussed and let me know if I can assist you in the future.   Referrals/Orders/Follow-Ups/Clinician Recommendations: none  This is a list of the screening recommended for you and due dates:  Health Maintenance  Topic Date Due   Zoster (Shingles) Vaccine (2 of 2) 10/06/2021   Yearly kidney health urinalysis for diabetes  12/16/2023   Eye exam for diabetics  12/22/2023   Yearly kidney function blood test for diabetes  05/22/2024   Flu Shot  05/23/2024   Hemoglobin A1C  06/01/2024   Complete foot exam   08/29/2024   Medicare Annual Wellness Visit  01/27/2025   DTaP/Tdap/Td vaccine (10 - Td or Tdap) 10/31/2031   Pneumonia Vaccine  Completed   HPV Vaccine  Aged Out   Colon Cancer Screening  Discontinued   COVID-19 Vaccine  Discontinued    Advanced directives: (Copy Requested) Please bring a copy of your health care power of attorney and living will to the office to be added to your chart at your convenience. You can mail to [facility] [address] [place], [place] [address] or email to [email or web address]  Next Medicare Annual Wellness Visit scheduled for next year: Yes  insert Preventive Care attachment Insert FALL PREVENTION attachment if needed

## 2024-02-16 ENCOUNTER — Other Ambulatory Visit: Payer: Self-pay | Admitting: "Endocrinology

## 2024-02-16 DIAGNOSIS — E0800 Diabetes mellitus due to underlying condition with hyperosmolarity without nonketotic hyperglycemic-hyperosmolar coma (NKHHC): Secondary | ICD-10-CM

## 2024-02-18 ENCOUNTER — Encounter: Payer: Self-pay | Admitting: Podiatry

## 2024-02-18 ENCOUNTER — Ambulatory Visit: Payer: Medicare Other | Admitting: Podiatry

## 2024-02-18 DIAGNOSIS — M79674 Pain in right toe(s): Secondary | ICD-10-CM

## 2024-02-18 DIAGNOSIS — E1142 Type 2 diabetes mellitus with diabetic polyneuropathy: Secondary | ICD-10-CM

## 2024-02-18 DIAGNOSIS — M79675 Pain in left toe(s): Secondary | ICD-10-CM

## 2024-02-18 DIAGNOSIS — Z794 Long term (current) use of insulin: Secondary | ICD-10-CM

## 2024-02-18 DIAGNOSIS — B351 Tinea unguium: Secondary | ICD-10-CM | POA: Diagnosis not present

## 2024-02-18 NOTE — Telephone Encounter (Signed)
 Requested Prescriptions   Pending Prescriptions Disp Refills   insulin  lispro (HUMALOG ) 100 UNIT/ML injection [Pharmacy Med Name: HumaLOG  100 UNIT/ML Subcutaneous Solution] 110 mL 3    Sig: INJECT SUBCUTANEOUSLY VIA  INSULIN  PUMP TOTAL OF 120 UNITS  DAILY

## 2024-02-18 NOTE — Progress Notes (Signed)
This patient returns to my office for at risk foot care.  This patient requires this care by a professional since this patient will be at risk due to having  diabetes.  This patient is unable to cut nails himself since the patient cannot reach his nails.These nails are painful walking and wearing shoes.  This patient presents for at risk foot care today.  General Appearance  Alert, conversant and in no acute stress.  Vascular  Dorsalis pedis and posterior tibial  pulses are palpable  bilaterally.  Capillary return is within normal limits  bilaterally. Temperature is within normal limits  bilaterally.  Neurologic  Senn-Weinstein monofilament wire test within normal limits  bilaterally. Muscle power within normal limits bilaterally.  Nails Thick disfigured discolored nails with subungual debris  hallux nails  bilaterally. No evidence of bacterial infection or drainage bilaterally.  Orthopedic  No limitations of motion  feet .  No crepitus or effusions noted.  No bony pathology or digital deformities noted.  Skin  normotropic skin with no porokeratosis noted bilaterally.  No signs of infections or ulcers noted.     Onychomycosis  Pain in right toes  Pain in left toes  Consent was obtained for treatment procedures.   Mechanical debridement of nails 1-5  bilaterally performed with a nail nipper.  Filed with dremel without incident.    Return office visit     3 months                Told patient to return for periodic foot care and evaluation due to potential at risk complications.   Gardiner Barefoot DPM

## 2024-02-26 ENCOUNTER — Encounter
Payer: Medicare Other | Attending: Physical Medicine and Rehabilitation | Admitting: Physical Medicine and Rehabilitation

## 2024-02-26 ENCOUNTER — Encounter: Payer: Self-pay | Admitting: Physical Medicine and Rehabilitation

## 2024-02-26 VITALS — BP 110/77 | HR 93 | Ht 71.75 in | Wt 259.0 lb

## 2024-02-26 DIAGNOSIS — M25362 Other instability, left knee: Secondary | ICD-10-CM | POA: Insufficient documentation

## 2024-02-26 DIAGNOSIS — M5432 Sciatica, left side: Secondary | ICD-10-CM | POA: Insufficient documentation

## 2024-02-26 DIAGNOSIS — F4321 Adjustment disorder with depressed mood: Secondary | ICD-10-CM | POA: Diagnosis present

## 2024-02-26 DIAGNOSIS — R42 Dizziness and giddiness: Secondary | ICD-10-CM | POA: Diagnosis present

## 2024-02-26 MED ORDER — AMITRIPTYLINE HCL 10 MG PO TABS: 10.0000 mg | ORAL_TABLET | Freq: Every day | ORAL | 1 refills | Status: DC

## 2024-02-26 NOTE — Progress Notes (Signed)
 Subjective:    Patient ID: [name], male    DOB: Oct 02, 1941, 83 y.o.   MRN: [id number]   HPI  1) Left sided sciatica: -did not topamax  because of potential side effect -is not taken gabapentin  but would be interested in this -the pain radiates down his left leg to past his left knee -pain ranges from 3-7 -has a back brace but does not like to use it -lidocaine  patch 5% helps a little more -pain is ok -he seldom uses the patch because he is usually sitting at home -he starts to feel pain about 2 seconds after standing -does not want to try more gabapentin  or topamax   2) Knee buckling -hurt severely, left is worse than the right -happens once in a while -had surgery for torn meniscus -his surgeon said his meniscus ripped in two -he walked out from the surgery  3) HTN: -elevated to 158/83 -does not check BP at home  Male with pmh of OSA, CKD, HTN, DM type 1 who presents for f/u of diabetic peripheral neuropathy and low back pain > neck pain.   On first visit, pt complained of b/l L>R back pain.  Started ~>20 years ago.  No inciting event.  Sitting/laying improve the pain.  Standing exacerbates the pain.  Sharp, burning pain.  Radiates to left posterior thigh.  Intermittent.  He has associated numbness/tingling.  He only tried ASA, which helps some.  Pain limits from doing ADLs and fishing.  He denies falls. Back pain has been ok especially if sitting down Can't stand more than 5 or 10 minutes Tried PT and cannot feel difference -pain is worst on left.  Last Qutenza  patch helped a lot for his back but he was charged $700, [place] sent the patches to speciality pharmacy this time and they were 100% covered Qutenza  patches helped on feet where he has tingling. He has less pain in his feet except for his toes which he thinks is due to gout [name] since he was ere last, landed on his knees He is doing well decreasing snacking after dinner 169 CBG right now.  -he usually snacks at  night on potato chips after dinner -does not eat much more baked potatoes Voltaren  cream helps the back pain but does not stop it When he fishes he feels pain from the jolting- fishing made it worse -He takes advil  and this helps a little.  Sometimes it moves into his legs.   Last clinic visit on 10/21/2020.  He had trigger point injections at that time.  Since that time, patient states he had great benefit for about a week.  He uses a TENS with benefit. Patient states he feels he is getting weaker. He is taking Baclofen  1-2/week.  Sleep has improved. He is taking Elavil  25.  He states he believe he lost weight, but not exercising. Denies falls. Lightheadedness has improved.    He does not take any other medications for his pain and does not desire to. He requires a cane for ambulation and has a handicap placard.   Blood pressure is better controlled today at 120/74  He has been to multiple speciialists and was told he will not be able to see from this eye after her had a fall on this eye Has not been using SPRINT PNS but it did help when he used it.  Has been having headaches.  -he is ready to try Qutenza  today -he has numbness and tingling from his shins down to  his feet and it is constant and feels uncomfortable -his back pain has also been severe -wife accompanies him today -he has decreased sensation in both feet   Pain Inventory Average Pain 3 Pain Right Now 3 My pain is intermittent, sharp, and tingling  In the last 24 hours, has pain interfered with the following? General activity 0 Relation with others 0 Enjoyment of life 4 What TIME of day is your pain at its worst? daytime Sleep (in general) Good  Pain is worse with: walking and standing Pain improves with: rest Relief from Meds: 3     Family History  Problem Relation Age of Onset   Colon cancer Brother 57   Colon polyps Brother    Lung cancer Brother    Other Mother        MVA, deceased 3s   Healthy  Daughter    Healthy Son    Rectal cancer Neg Hx    Stomach cancer Neg Hx    Social History   Socioeconomic History   Marital status: Married    Spouse name: Not on file   Number of children: Not on file   Years of education: Not on file   Highest education level: Not on file  Occupational History    Employer: [employer]  Tobacco Use   Smoking status: Former    Current packs/day: 0.00    Average packs/day: 2.0 packs/day for 31.0 years (62.0 ttl pk-yrs)    Types: Cigarettes    Start date: 71    Quit date: 10/23/1982    Years since quitting: 41.3   Smokeless tobacco: Never  Vaping Use   Vaping status: Never Used  Substance and Sexual Activity   Alcohol  use: No    Alcohol /week: 0.0 standard drinks of alcohol    Drug use: No   Sexual activity: Not on file  Other Topics Concern   Not on file  Social History Narrative   Lives with wife in a one story home.  Has a son and a daughter.     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     Education: 2 years of college.   Social Drivers of Corporate investment banker Strain: Low Risk  (01/28/2024)   Overall Financial Resource Strain (CARDIA)    Difficulty of Paying Living Expenses: Not hard at all  Food Insecurity: No Food Insecurity (01/28/2024)   Hunger Vital Sign    Worried About Running Out of Food in the Last Year: Never true    Ran Out of Food in the Last Year: Never true  Transportation Needs: No Transportation Needs (01/28/2024)   PRAPARE - Administrator, Civil Service (Medical): No    Lack of Transportation (Non-Medical): No  Physical Activity: Inactive (01/28/2024)   Exercise Vital Sign    Days of Exercise per Week: 0 days    Minutes of Exercise per Session: 0 min  Stress: No Stress Concern Present (01/28/2024)   [employer] of Occupational Health - Occupational Stress Questionnaire    Feeling of Stress : Not at all  Social Connections: Moderately Isolated (01/28/2024)   Social  Connection and Isolation Panel [NHANES]    Frequency of Communication with Friends and Family: More than three times a week    Frequency of Social Gatherings with Friends and Family: Not on file    Attends Religious Services: Never    Database administrator or Organizations: No    Attends Banker Meetings:  Never    Marital Status: Married   Past Surgical History:  Procedure Laterality Date   Abdominal US   09/1997   arm fracture Left 1958   with hardware   Colon cancer screening     COLONOSCOPY  08/18/2004   diverticulitis   COLONOSCOPY  09/24/2009   ELECTROCARDIOGRAM  02/2006   FLEXIBLE SIGMOIDOSCOPY  03/04/2001   polyps, anal fissure   GANGLION CYST EXCISION Left 1980   HIP PINNING,CANNULATED Right 04/25/2021   Procedure: CANNULATED HIP PINNING;  Surgeon: Adonica Hoose, MD;  Location: [facility];  Service: Orthopedics;  Laterality: Right;   Lower Arterial  04/13/2004   POLYPECTOMY     Rest Cardiolite  03/19/2003   Past Medical History:  Diagnosis Date   Allergy    Anal fissure    Anemia, unspecified    Arthritis    Spinal OA   BACK PAIN, LUMBAR 10/23/2007   CARDIAC MURMUR, SYSTOLIC 10/27/2008   DIABETES MELLITUS, TYPE I 05/20/2007   DIASTOLIC DYSFUNCTION 11/04/2008   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HCC)    Duodenitis    Edema 05/12/2008   Esophageal stricture    GERD (gastroesophageal reflux disease)    GOUT 05/20/2007   Gynecomastia    Hepatic steatosis    HYPERLIPIDEMIA 05/20/2007   Hypertension    Hypogonadism male    Morbid obesity (HCC) 09/10/2009   OBSTRUCTIVE SLEEP APNEA 11/04/2007   Personal history of colonic polyps    RENAL INSUFFICIENCY 05/12/2008   Sleep apnea    uses cpap   Squamous cell carcinoma of skin 02/16/2020   left lower leg, anterior-cx3,65fu   Urolithiasis    There were no vitals taken for this visit.  Opioid Risk Score:   Fall Risk Score:  `1  Depression screen [facility] 2/9     01/28/2024   11:31 AM 10/29/2023    2:35 PM  06/26/2023    2:38 PM 05/23/2023    9:24 AM 03/21/2023    8:40 AM 01/26/2023    2:18 PM 01/18/2023   10:17 AM  Depression screen [facility] 2/9  Decreased Interest 0 0 0 0 0 0 0  Down, Depressed, Hopeless 0 0 0 0 0 0 0  PHQ - 2 Score 0 0 0 0 0 0 0  Altered sleeping 0        Tired, decreased energy 0        Change in appetite 0        Feeling bad or failure about yourself  0        Trouble concentrating 0        Moving slowly or fidgety/restless 0        Suicidal thoughts 0        PHQ-9 Score 0        Difficult doing work/chores Not difficult at all          Review of Systems  HENT: Negative.    Eyes: Negative.   Respiratory:  Positive for shortness of breath. Negative for apnea.   Cardiovascular: Negative.   Gastrointestinal: Negative.   Endocrine:       Diabetic High blood sugar  Genitourinary: Negative.           Musculoskeletal:  Positive for arthralgias, back pain, gait problem, myalgias and neck pain.  Skin: Negative.   Allergic/Immunologic: Negative.   Neurological:  Positive for weakness and numbness.       Tingling  Hematological: Negative.   All other systems reviewed and  are negative.     Objective:   Physical Exam  Constitutional: No distress . Vital signs reviewed. BMI 35.36, weight 263 lbs, 156/85 Gen: no distress, normal appearing HEENT: oral mucosa pink and moist, NCAT Cardio: Reg rate Chest: normal effort, normal rate of breathing Abd: soft, non-distended Ext: no edema Psych: pleasant, normal affect Skin: intact Musc: TTP lumbar spinous process Musc: Gait: Mildly antalgic. Ambulating with cane at all times.  Neuro: Alert  HOH             Strength          5/5 in all LE myotomes Decreased sensation in bilateral feet Pain is worst with walking    Assessment & Plan:  Male with pmh of OSA, CKD, HTN, DM type 1 with peripheral neuropathy presents for follow up of low back pain > neck pain.     1. Chronic mechanical low back pain             MRIs C,L-spine  early 2016 suggesting multilevel spondylosis with mild b/l multilevel foraminal narrowing C4-C7 and shallow disc bulge at L5-S1 without central canal or foraminal stenosis.   Continue lidocaine  patch  Discussed physical therapy but he prefers not to pursue  Discussed prednisone   D/c gabapentin   Discussed that pain informs regarding what movements to avoid  -discussed medial branch blocks  Discussed core strengthening exercises             Avoid NSAIDs due to CKD, discussed with patient since he takes Advil   Continue voltaren  gel, advised that he could use up to 4 times per day.              Unable to tolerate Gabapentin , Lyrica              Cont HEP  Continue topamax   Discussed steroid injections but they do not seem to help  Discussed repeat MIR of lumbar spine  Continue TENS             Aquatic therapy, not going anymore, states ineffective             Cont tylenol   Robaxin  500 TID PRN, changed to Baclofen  10 TID PRN due to change insurance, continue   Discussed that stopped cymbalta              Cont back brace during periods of excessive activity, reminded to avoid prolonged use  Continue Lidoderm  OTC, reminded              Encouraged rest breaks             Acknowledges he can do more if he make a conscious effort             PT completed    Pain over spinous process where clothing is resulting increased pressure.  Encourage change in clothing.   Sprint PNS removed today.   -Provided with a pain relief journal and discussed that it contains foods and lifestyle tips to naturally help to improve pain. Discussed that these lifestyle strategies are also very good for health unlike some medications which can have negative side effects. Discussed that the act of keeping a journal can be therapeutic and helpful to realize patterns what helps to trigger and alleviate pain.  -Discussed Qutenza  as an option for neuropathic pain control. Discussed that this is a capsaicin  patch, stronger than  capsaicin  cream. Discussed that it is currently approved for diabetic peripheral neuropathy and post-herpetic neuralgia, but that it has also  shown benefit in treating other forms of neuropathy. Provided patient with link to site to learn more about the patch: [email or web address].qutenza .com/. Discussed that the patch would be placed in office and benefits usually last 3 months. Discussed that unintended exposure to capsaicin  can cause severe irritation of eyes, mucous membranes, respiratory tract, and skin, but that Qutenza  is a local treatment and does not have the systemic side effects of other nerve medications. Discussed that there may be pain, itching, erythema, and decreased sensory function associated with the application of Qutenza . Side effects usually subside within 1 week. A cold pack of analgesic medications can help with these side effects. Blood pressure can also be increased due to pain associated with administration of the patch.   2. Sacroiliitis             See #1             Continue brace             Not main pain generator at present, does not wish to proceed with injection at this time, particularly due to DM   3. Sleep disturbance             Recently new mattress  -discussed that his phone says he sleeps well  Continue gabapentin  300mg  TID PRN             Continue CPAP             Improved -Try to go outside near sunrise -Get exercise during the day.  -Turn off all devices an hour before bedtime.  -Teas that can benefit: chamomile, valerian root, Brahmi (Bacopa) -Can consider over the counter melatonin, magnesium , and/or L-theanine. Melatonin is an anti-oxidant with multiple health benefits. Magnesium  is involved in greater than 300 enzymatic reactions in the body and most of us  are deficient as our soil is often depleted. There are 7 different types of magnesium - Bioptemizer's is a supplement with all 7 types, and each has unique benefits. Magnesium  can also help with constipation  and anxiety.  -Pistachios naturally increase the production of melatonin -Cozy Earth bamboo bed sheets are free from toxic chemicals.  -Tart cherry juice or a tart cherry supplement can improve sleep and soreness post-workout   4. Myalgia  Will schedule for trigger point injections   5. Diabetic neuropathy  -discussed that diabetes has been well controlled             See #1, #3            Continue elavil  to 50mg  qhs, continue 25mg  educated on signs/symptoms of seratonin syndrome previously  Provided dietary education  Refilled Cymbalta .   -Discussed Qutenza  as an option for neuropathic pain control. Discussed that this is a capsaicin  patch, stronger than capsaicin  cream. Discussed that it is currently approved for diabetic peripheral neuropathy and post-herpetic neuralgia, but that it has also shown benefit in treating other forms of neuropathy. Provided patient with link to site to learn more about the patch: [email or web address].qutenza .com/. Discussed that the patch would be placed in office and benefits usually last 3 months. Discussed that unintended exposure to capsaicin  can cause severe irritation of eyes, mucous membranes, respiratory tract, and skin, but that Qutenza  is a local treatment and does not have the systemic side effects of other nerve medications. Discussed that there may be pain, itching, erythema, and decreased sensory function associated with the application of Qutenza . Side effects usually subside within 1 week. A cold pack of analgesic medications can  help with these side effects. Blood pressure can also be increased due to pain associated with administration of the patch.   Recommended no snacking after dinner -Discussed current symptoms of pain and history of pain.  -Discussed benefits of exercise in reducing pain. -Discussed following foods that may reduce pain: 1) Ginger (especially studied for arthritis)- reduce leukotriene production to decrease inflammation 2) Blueberries-  high in phytonutrients that decrease inflammation 3) Salmon- marine omega-3s reduce joint swelling and pain 4) Pumpkin seeds- reduce inflammation 5) dark chocolate- reduces inflammation 6) turmeric- reduces inflammation 7) tart cherries - reduce pain and stiffness 8) extra virgin olive oil - its compound olecanthal helps to block prostaglandins  9) chili peppers- can be eaten or applied topically via capsaicin  10) mint- helpful for headache, muscle aches, joint pain, and itching 11) garlic- reduces inflammation  Link to further information on diet for chronic pain: http://www.bray.com/   --Discussed Qutenza  as an option for neuropathic pain control. Discussed that this is a capsaicin  patch, stronger than capsaicin  cream. Discussed that it is currently approved for diabetic peripheral neuropathy and post-herpetic neuralgia, but that it has also shown benefit in treating other forms of neuropathy. Provided patient with link to site to learn more about the patch: [email or web address].qutenza .com/. Discussed that the patch would be placed in office and benefits usually last 3 months. Discussed that unintended exposure to capsaicin  can cause severe irritation of eyes, mucous membranes, respiratory tract, and skin, but that Qutenza  is a local treatment and does not have the systemic side effects of other nerve medications. Discussed that there may be pain, itching, erythema, and decreased sensory function associated with the application of Qutenza . Side effects usually subside within 1 week. A cold pack of analgesic medications can help with these side effects. Blood pressure can also be increased due to pain associated with administration of the patch.   -discussed lidocaine  patches can be applied on the feet as well  -discussed that he is no longer having pain, just the tingling   6. Morbid obesity             Not interested in seeing  dietitian at this time  Encouraged activity   No attempts at weight loss  Discussed Topamax .   Commended on improvement!   7. Gait abnormality             Completed therapies, cont HEP  D/c cane  Decrease amitriptyline  to 10mg    8. Skin cancer of left lateral foot: discussed that this was skin cancer and it was removed  9. Lightheadedness  Encouraged BP monitoring at home  Decrease amitriptyline  to 10mg  HS  10. Knee pain: -continue applying voltaren  gel -discussed that ne needed meniscal surgery  11. Chronic gout: continue allopurinol , recommended to avoid fructose  12. HTN: -BP is 110/77 -Advised checking BP daily at home and logging results to bring into follow-up appointment with PCP and myself. -Reviewed BP meds today.  -Advised regarding healthy foods that can help lower blood pressure and provided with a list: 1) citrus foods- high in vitamins and minerals 2) salmon and other fatty fish - reduces inflammation and oxylipins 3) swiss chard (leafy green)- high level of nitrates 4) pumpkin seeds- one of the best natural sources of magnesium  5) Beans and lentils- high in fiber, magnesium , and potassium 6) Berries- high in flavonoids 7) Amaranth (whole grain, can be cooked similarly to rice and oats)- high in magnesium  and fiber 8) Pistachios- even more effective at reducing BP than other  nuts 9) Carrots- high in phenolic compounds that relax blood vessels and reduce inflammation 10) Celery- contain phthalides that relax tissues of arterial walls 11) Tomatoes- can also improve cholesterol and reduce risk of heart disease 12) Broccoli- good source of magnesium , calcium , and potassium 13) Greek yogurt: high in potassium and calcium  14) Herbs and spices: Celery seed, cilantro, saffron, lemongrass, black cumin, ginseng, cinnamon, cardamom, sweet basil, and ginger 15) Chia and flax seeds- also help to lower cholesterol and blood sugar 16) Beets- high levels of nitrates that  relax blood vessels  17) spinach and bananas- high in potassium  -Provided lise of supplements that can help with hypertension:  1) magnesium : one high quality brand is Bioptemizers since it contains all 7 types of magnesium , otherwise over the counter magnesium  gluconate 400mg  is a good option 2) B vitamins 3) vitamin D  4) potassium 5) CoQ10 6) L-arginine 7) Vitamin C 8) Beetroot -Educated that goal BP is 120/80. -Made goal to incorporate some of the above foods into diet.    13) Grief -discussed that he has grief regarding the death of his daughter

## 2024-03-03 ENCOUNTER — Encounter: Payer: Self-pay | Admitting: "Endocrinology

## 2024-03-03 ENCOUNTER — Ambulatory Visit: Payer: Medicare Other | Admitting: "Endocrinology

## 2024-03-03 VITALS — BP 130/70 | HR 80 | Ht 71.0 in | Wt 263.0 lb

## 2024-03-03 DIAGNOSIS — E782 Mixed hyperlipidemia: Secondary | ICD-10-CM

## 2024-03-03 DIAGNOSIS — E1140 Type 2 diabetes mellitus with diabetic neuropathy, unspecified: Secondary | ICD-10-CM

## 2024-03-03 DIAGNOSIS — Z794 Long term (current) use of insulin: Secondary | ICD-10-CM

## 2024-03-03 DIAGNOSIS — Z9641 Presence of insulin pump (external) (internal): Secondary | ICD-10-CM

## 2024-03-03 LAB — POCT GLYCOSYLATED HEMOGLOBIN (HGB A1C): Hemoglobin A1C: 6.7 % — AB (ref 4.0–5.6)

## 2024-03-03 MED ORDER — BAQSIMI ONE PACK 3 MG/DOSE NA POWD
1.0000 | NASAL | 3 refills | Status: AC | PRN
Start: 2024-03-03 — End: ?

## 2024-03-03 NOTE — Patient Instructions (Signed)

## 2024-03-03 NOTE — Progress Notes (Signed)
 [facility] Note  [name] [birth year] [id number]  Referring Provider: @REFPROVFULL @ Primary Care Provider: Tonna Frederic, MD Reason for consultation: No chief complaint on file.   Assessment & Plan Diagnoses and all orders for this visit:  Type 2 diabetes mellitus with diabetic neuropathy, with long-term current use of insulin  (HCC) -     POCT glycosylated hemoglobin (Hb A1C) -     Comprehensive metabolic panel with GFR -     Microalbumin / creatinine urine ratio -     Glucagon (BAQSIMI ONE PACK) 3 MG/DOSE POWD; Place 1 Device into the nose as needed (Low blood sugar with impaired consciousness).  Insulin  pump in place  Mixed hypercholesterolemia and hypertriglyceridemia    Type 2 diabetes mellitus Hba1c goal less than 7.0, current Hba1c is 6.7. Will recommend:  Insulin  Pump Settings:  Continue settings as follows: HUMALOG  Insulin  with pump: T-slim control IQ, DexCom G6  BASAL settings: 12-8 am = 1.65, 8-12 noon= 1.8, Noon to 12 MN = 1.75 Carb 1:3 (1:2.5 from noon-5pm and 1.4 from 5pm-MN)  correction factor 1: 25>100 Active insulin  5 hours (on Control IQ)  Bolus 5 min before each meal  03/03/24: advised against lows. Discussed aad prescribed baqsimi   -check blood sugars before each meals and at bedtime -enter blood sugars in the pump and calibrate three times a day. Calibrate when fasting and at bedtime and one calibration before the meal -avoid calibrations when the blood sugar is rising rapidly or falling -pre bolus carbs when eating. Pre bolus needs to be 5 minutes before the meal -do not enter false or fake carbohydrates -do not enter carbohydratess after you eat and try to catch up with a high blood sugar -change sets on time to have consistent insulin  absorption and prevent scar formation -call the office /answering service for blood sugar questions regarding hyper or hypoglycemia or sensor and transmitter issues -insulin  settings  updated and scanned in chart  No known contraindications to any of above medications  Hyperlipidemia -Last LDL off goal: 73 -on rosuvastatin  40 mg every day, fish oil -stopped repatha  140 mg q14 days due to UTIs -Follow low fat diet and exercise, pt likes fried foods  -ordered repeat lab   -Blood pressure goal <140/90 - Microalbumin/creatinine goal < 30 -on losartan 25 mg every day -diet changes including salt restriction -limit eating outside -counseled BP targets per standards of diabetes care -Uncontrolled blood pressure can lead to retinopathy, nephropathy and cardiovascular and atherosclerotic heart disease  Reviewed and counseled on: -A1C target -Blood sugar targets -Complications of uncontrolled diabetes  -Checking blood sugar before meals and bedtime and bring log next visit -All medications with mechanism of action and side effects -Hypoglycemia management: rule of 15's, Glucagon Emergency Kit and medical alert ID -low-carb low-fat plate-method diet -At least 20 minutes of physical activity per day -Annual dilated retinal eye exam and foot exam -compliance and follow up needs -follow up as scheduled or earlier if problem gets worse  Call if blood sugar is less than 70 or consistently above 250    Take a 15 gm snack of carbohydrate at bedtime before you go to sleep if your blood sugar is less than 100.    If you are going to fast after midnight for a test or procedure, ask your physician for instructions on how to reduce/decrease your insulin  dose.    Call if blood sugar is less than 70 or consistently above 250  -Treating a low sugar  by rule of 15  (15 gms of sugar every 15 min until sugar is more than 70) If you feel your sugar is low, test your sugar to be sure If your sugar is low (less than 70), then take 15 grams of a fast acting Carbohydrate (3-4 glucose tablets or glucose gel or 4 ounces of juice or regular soda) Recheck your sugar 15 min after treating low  to make sure it is more than 70 If sugar is still less than 70, treat again with 15 grams of carbohydrate          Don't drive the hour of hypoglycemia  If unconscious/unable to eat or drink by mouth, use glucagon injection or nasal spray baqsimi and call 911. Can repeat again in 15 min if still unconscious.   I have reviewed current medications, nurse's notes, allergies, vital signs, past medical and surgical history, family medical history, and social history for this encounter. Counseled patient on symptoms, examination findings, lab findings, imaging results, treatment decisions and monitoring and prognosis. The patient understood the recommendations and agrees with the treatment plan. All questions regarding treatment plan were fully answered.  Return in about 3 months (around 06/03/2024) for visit, labs today.   Jorge Newcomer, MD 03/03/24   History of Present Illness [name] is a 83 y.o. year old male who presents to our clinic for a follow up for Type 2 diabetes mellitus. Mithcell Ozbirn was first diagnosed in 1986.  Previous history: Insulin  was started in 1995  Recent history:     Non-insulin  hypoglycemic drugs: None     Insulin  pump: T-slim control IQ       BASAL settings: 12-8 am = 1.65, 8-12 noon= 1.8, Noon to 12 MN = 1.75 Carb Carb 1:3 (1:25 from noon-5pm)  Active insulin  5 hours, correction factor 1:25>100  Ambulatory BG Monitoring  CGM interpretation: At today's visit, we reviewed her CGM downloads. The full report is scanned in the media. Reviewing the CGM trends, BG are well controlled across the day.  Insulin  pump  Pump downloaded and reviewed. Blood sugars reviewed Basal rates reviewed Auto mode % reviewed Manual mode % reviewed Sensor wear % reviewed Readings above target % reviewed Readings at target % reviewed Readings below target % reviewed TDD units of insulin  /day reviewed Changing sets on time reviewed Carb counting reviewed Basal bolus ratio  reviewed Basal % reviewed Bolus % reviewed Duration of pump suspends reviewed No reaction at insulin  pump sites Set change=every 3 days Reservoir change = every 3 days  Physical Exam  BP 130/70   Pulse 80   Ht 5\' 11"  (1.803 m)   Wt 263 lb (119.3 kg)   SpO2 98%   BMI 36.68 kg/m    Constitutional: well developed, well nourished Head: normocephalic, atraumatic Eyes: sclera anicteric, no redness Neck: supple Lungs: normal respiratory effort Neurology: alert and oriented Skin: dry, no appreciable rashes Musculoskeletal: no appreciable defects Psychiatric: normal mood and affect  Current Medications Patient's Medications  New Prescriptions   GLUCAGON (BAQSIMI ONE PACK) 3 MG/DOSE POWD    Place 1 Device into the nose as needed (Low blood sugar with impaired consciousness).  Previous Medications   ACCU-CHEK FASTCLIX LANCETS MISC    USE TO CHECK BLOOD SUGAR UP TO 6 TIMES DAILY   ALFUZOSIN  (UROXATRAL ) 10 MG 24 HR TABLET    TAKE 1 TABLET BY MOUTH  DAILY WITH BREAKFAST   AMITRIPTYLINE  (ELAVIL ) 10 MG TABLET    Take 1 tablet (10 mg  total) by mouth at bedtime.   BLOOD GLUCOSE MONITORING SUPPL (ACCU-CHEK AVIVA PLUS) W/DEVICE KIT    Use as directed   CETIRIZINE  (ZYRTEC ) 10 MG TABLET    Take 10 mg by mouth daily as needed for allergies.   CONTINUOUS BLOOD GLUC SENSOR (DEXCOM G6 SENSOR) MISC    Inject 1 Device into the skin as directed. Apply to skin SQ every 10 days   DICLOFENAC  SODIUM (VOLTAREN ) 1 % GEL    Apply 1 Application topically.   DOXYCYCLINE HYCLATE 200 MG TBEC    Take by mouth in the morning and at bedtime.   FINASTERIDE (PROSCAR) 5 MG TABLET    Take 5 mg by mouth daily.   FLUTICASONE  (FLONASE) 50 MCG/ACT NASAL SPRAY    Place 1 spray into the nose daily as needed for allergies.    FOLIC ACID  (FOLVITE ) 1 MG TABLET    Take 1 mg by mouth daily.   FUROSEMIDE  (LASIX ) 20 MG TABLET    TAKE 1 TABLET BY MOUTH  DAILY   GLUCOSE BLOOD (ACCU-CHEK AVIVA PLUS) TEST STRIP    USE TO TEST BLOOD  SUGAR 6  TIMES PER DAY; E11.9   INSULIN  INFUSION PUMP SUPPLIES (AUTOSOFT XC INFUSION SET) MISC    Use with 9mm cannula and 23 inch tubing. *5 boxes (90 day supply)  Change every 3 days   INSULIN  INFUSION PUMP SUPPLIES (T:SLIM INSULIN  CARTRIDGE ) MISC    Use with insulin  pump, fill once every 2 days.   INSULIN  LISPRO (HUMALOG ) 100 UNIT/ML INJECTION    INJECT SUBCUTANEOUSLY VIA  INSULIN  PUMP TOTAL OF 120 UNITS  DAILY   LIDOCAINE  (LIDODERM ) 5 %    Place 1 patch onto the skin daily. Remove & Discard patch within 12 hours or as directed by MD   MULTIPLE VITAMINS-MINERALS (CENTRUM SILVER PO)    Take 1 tablet by mouth daily.   OMEGA-3 FATTY ACIDS (FISH OIL) 1200 MG CAPS    Take 1,200 mg by mouth daily.   PHENAZOPYRIDINE (PYRIDIUM) 200 MG TABLET    Take 200 mg by mouth 3 (three) times daily as needed.   ROSUVASTATIN  (CRESTOR ) 40 MG TABLET    TAKE 1 TABLET BY MOUTH AT  BEDTIME   VITAMIN B-12 (CYANOCOBALAMIN ) 1000 MCG TABLET    Take 1,000 mcg by mouth daily.  Modified Medications   No medications on file  Discontinued Medications   No medications on file    Allergies Allergies  Allergen Reactions   Atorvastatin Calcium     Lipitor [Atorvastatin] Other (See Comments)    unknown   Niacin     REACTION: Severe heartburn   Pioglitazone     REACTION: Edema    Past Medical History Past Medical History:  Diagnosis Date   Allergy    Anal fissure    Anemia, unspecified    Arthritis    Spinal OA   BACK PAIN, LUMBAR 10/23/2007   CARDIAC MURMUR, SYSTOLIC 10/27/2008   DIABETES MELLITUS, TYPE I 05/20/2007   DIASTOLIC DYSFUNCTION 11/04/2008   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HCC)    Duodenitis    Edema 05/12/2008   Esophageal stricture    GERD (gastroesophageal reflux disease)    GOUT 05/20/2007   Gynecomastia    Hepatic steatosis    HYPERLIPIDEMIA 05/20/2007   Hypertension    Hypogonadism male    Morbid obesity (HCC) 09/10/2009   OBSTRUCTIVE SLEEP APNEA 11/04/2007   Personal  history of colonic polyps    RENAL INSUFFICIENCY 05/12/2008  Sleep apnea    uses cpap   Squamous cell carcinoma of skin 02/16/2020   left lower leg, anterior-cx3,43fu   Urolithiasis     Past Surgical History Past Surgical History:  Procedure Laterality Date   Abdominal US   09/1997   arm fracture Left 1958   with hardware   Colon cancer screening     COLONOSCOPY  08/18/2004   diverticulitis   COLONOSCOPY  09/24/2009   ELECTROCARDIOGRAM  02/2006   FLEXIBLE SIGMOIDOSCOPY  03/04/2001   polyps, anal fissure   GANGLION CYST EXCISION Left 1980   HIP PINNING,CANNULATED Right 04/25/2021   Procedure: CANNULATED HIP PINNING;  Surgeon: Adonica Hoose, MD;  Location: [facility];  Service: Orthopedics;  Laterality: Right;   Lower Arterial  04/13/2004   POLYPECTOMY     Rest Cardiolite  03/19/2003    Family History family history includes Colon cancer (age of onset: 61) in his brother; Colon polyps in his brother; Healthy in his daughter and son; Lung cancer in his brother; Other in his mother.  Social History Social History   Socioeconomic History   Marital status: Married    Spouse name: Not on file   Number of children: Not on file   Years of education: Not on file   Highest education level: Not on file  Occupational History    Employer: [employer]  Tobacco Use   Smoking status: Former    Current packs/day: 0.00    Average packs/day: 2.0 packs/day for 31.0 years (62.0 ttl pk-yrs)    Types: Cigarettes    Start date: 55    Quit date: 10/23/1982    Years since quitting: 41.3   Smokeless tobacco: Never  Vaping Use   Vaping status: Never Used  Substance and Sexual Activity   Alcohol  use: No    Alcohol /week: 0.0 standard drinks of alcohol    Drug use: No   Sexual activity: Not on file  Other Topics Concern   Not on file  Social History Narrative   Lives with wife in a one story home.  Has a son and a daughter.     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      Education: 2 years of college.   Social Drivers of Corporate investment banker Strain: Low Risk  (01/28/2024)   Overall Financial Resource Strain (CARDIA)    Difficulty of Paying Living Expenses: Not hard at all  Food Insecurity: No Food Insecurity (01/28/2024)   Hunger Vital Sign    Worried About Running Out of Food in the Last Year: Never true    Ran Out of Food in the Last Year: Never true  Transportation Needs: No Transportation Needs (01/28/2024)   PRAPARE - Administrator, Civil Service (Medical): No    Lack of Transportation (Non-Medical): No  Physical Activity: Inactive (01/28/2024)   Exercise Vital Sign    Days of Exercise per Week: 0 days    Minutes of Exercise per Session: 0 min  Stress: No Stress Concern Present (01/28/2024)   [employer] of Occupational Health - Occupational Stress Questionnaire    Feeling of Stress : Not at all  Social Connections: Moderately Isolated (01/28/2024)   Social Connection and Isolation Panel [NHANES]    Frequency of Communication with Friends and Family: More than three times a week    Frequency of Social Gatherings with Friends and Family: Not on file    Attends Religious Services: Never    Active Member of [employer]  or Organizations: No    Attends Banker Meetings: Never    Marital Status: Married  Catering manager Violence: Not At Risk (01/28/2024)   Humiliation, Afraid, Rape, and Kick questionnaire    Fear of Current or Ex-Partner: No    Emotionally Abused: No    Physically Abused: No    Sexually Abused: No    Lab Results  Component Value Date   HGBA1C 6.6 (A) 12/03/2023   Lab Results  Component Value Date   CHOL 136 12/10/2023   Lab Results  Component Value Date   HDL 36 (L) 12/10/2023   Lab Results  Component Value Date   LDLCALC 73 12/10/2023   Lab Results  Component Value Date   TRIG 154 (H) 12/10/2023   Lab Results  Component Value Date   CHOLHDL 3.8 12/10/2023   Lab Results  Component Value  Date   CREATININE 1.75 (H) 05/23/2023   Lab Results  Component Value Date   MICROALBUR 7.5 (H) 12/15/2022    Parts of this note may have been dictated using voice recognition software. There may be variances in spelling and vocabulary which are unintentional. Not all errors are proofread. Please notify the [facility] if any discrepancies are noted or if the meaning of any statement is not clear.

## 2024-03-04 LAB — COMPREHENSIVE METABOLIC PANEL WITH GFR
AG Ratio: 1.7 (calc) (ref 1.0–2.5)
ALT: 17 U/L (ref 9–46)
AST: 17 U/L (ref 10–35)
Albumin: 4.1 g/dL (ref 3.6–5.1)
Alkaline phosphatase (APISO): 82 U/L (ref 35–144)
BUN/Creatinine Ratio: 14 (calc) (ref 6–22)
BUN: 21 mg/dL (ref 7–25)
CO2: 32 mmol/L (ref 20–32)
Calcium: 9.2 mg/dL (ref 8.6–10.3)
Chloride: 104 mmol/L (ref 98–110)
Creat: 1.52 mg/dL — ABNORMAL HIGH (ref 0.70–1.22)
Globulin: 2.4 g/dL (ref 1.9–3.7)
Glucose, Bld: 142 mg/dL — ABNORMAL HIGH (ref 65–99)
Potassium: 4.2 mmol/L (ref 3.5–5.3)
Sodium: 142 mmol/L (ref 135–146)
Total Bilirubin: 0.5 mg/dL (ref 0.2–1.2)
Total Protein: 6.5 g/dL (ref 6.1–8.1)
eGFR: 45 mL/min/{1.73_m2} — ABNORMAL LOW (ref >=60)

## 2024-03-04 LAB — MICROALBUMIN / CREATININE URINE RATIO
Creatinine, Urine: 109 mg/dL (ref 20–320)
Microalb Creat Ratio: 31 mg/g{creat} — ABNORMAL HIGH (ref <30)
Microalb, Ur: 3.4 mg/dL

## 2024-05-19 ENCOUNTER — Encounter: Payer: Self-pay | Admitting: Podiatry

## 2024-05-19 ENCOUNTER — Ambulatory Visit (INDEPENDENT_AMBULATORY_CARE_PROVIDER_SITE_OTHER): Admitting: Podiatry

## 2024-05-19 DIAGNOSIS — B351 Tinea unguium: Secondary | ICD-10-CM | POA: Diagnosis not present

## 2024-05-19 DIAGNOSIS — M79674 Pain in right toe(s): Secondary | ICD-10-CM

## 2024-05-19 DIAGNOSIS — M79675 Pain in left toe(s): Secondary | ICD-10-CM | POA: Diagnosis not present

## 2024-05-19 DIAGNOSIS — E1142 Type 2 diabetes mellitus with diabetic polyneuropathy: Secondary | ICD-10-CM

## 2024-05-19 DIAGNOSIS — Z794 Long term (current) use of insulin: Secondary | ICD-10-CM

## 2024-05-19 NOTE — Progress Notes (Signed)
This patient returns to my office for at risk foot care.  This patient requires this care by a professional since this patient will be at risk due to having  diabetes.  This patient is unable to cut nails himself since the patient cannot reach his nails.These nails are painful walking and wearing shoes.  This patient presents for at risk foot care today.  General Appearance  Alert, conversant and in no acute stress.  Vascular  Dorsalis pedis and posterior tibial  pulses are palpable  bilaterally.  Capillary return is within normal limits  bilaterally. Temperature is within normal limits  bilaterally.  Neurologic  Senn-Weinstein monofilament wire test within normal limits  bilaterally. Muscle power within normal limits bilaterally.  Nails Thick disfigured discolored nails with subungual debris  hallux nails  bilaterally. No evidence of bacterial infection or drainage bilaterally.  Orthopedic  No limitations of motion  feet .  No crepitus or effusions noted.  No bony pathology or digital deformities noted.  Skin  normotropic skin with no porokeratosis noted bilaterally.  No signs of infections or ulcers noted.     Onychomycosis  Pain in right toes  Pain in left toes  Consent was obtained for treatment procedures.   Mechanical debridement of nails 1-5  bilaterally performed with a nail nipper.  Filed with dremel without incident.    Return office visit     3 months                Told patient to return for periodic foot care and evaluation due to potential at risk complications.   Gardiner Barefoot DPM

## 2024-05-22 ENCOUNTER — Ambulatory Visit: Payer: Self-pay | Admitting: Family Medicine

## 2024-05-22 ENCOUNTER — Encounter: Payer: Self-pay | Admitting: Family Medicine

## 2024-05-22 ENCOUNTER — Ambulatory Visit: Admitting: Family Medicine

## 2024-05-22 VITALS — BP 126/60 | HR 93 | Temp 96.9°F | Ht 71.0 in | Wt 259.2 lb

## 2024-05-22 DIAGNOSIS — E7800 Pure hypercholesterolemia, unspecified: Secondary | ICD-10-CM

## 2024-05-22 DIAGNOSIS — E538 Deficiency of other specified B group vitamins: Secondary | ICD-10-CM | POA: Diagnosis not present

## 2024-05-22 DIAGNOSIS — N1831 Chronic kidney disease, stage 3a: Secondary | ICD-10-CM

## 2024-05-22 DIAGNOSIS — Z8739 Personal history of other diseases of the musculoskeletal system and connective tissue: Secondary | ICD-10-CM | POA: Diagnosis not present

## 2024-05-22 DIAGNOSIS — R002 Palpitations: Secondary | ICD-10-CM | POA: Insufficient documentation

## 2024-05-22 LAB — COMPREHENSIVE METABOLIC PANEL WITH GFR
ALT: 20 U/L (ref 0–53)
AST: 17 U/L (ref 0–37)
Albumin: 4.2 g/dL (ref 3.5–5.2)
Alkaline Phosphatase: 82 U/L (ref 39–117)
BUN: 18 mg/dL (ref 6–23)
CO2: 25 meq/L (ref 19–32)
Calcium: 9.2 mg/dL (ref 8.4–10.5)
Chloride: 100 meq/L (ref 96–112)
Creatinine, Ser: 1.64 mg/dL — ABNORMAL HIGH (ref 0.40–1.50)
GFR: 38.69 mL/min — ABNORMAL LOW (ref >60.00)
Glucose, Bld: 162 mg/dL — ABNORMAL HIGH (ref 70–99)
Potassium: 4.2 meq/L (ref 3.5–5.1)
Sodium: 139 meq/L (ref 135–145)
Total Bilirubin: 0.7 mg/dL (ref 0.2–1.2)
Total Protein: 6.7 g/dL (ref 6.0–8.3)

## 2024-05-22 LAB — CBC
HCT: 48.4 % (ref 39.0–52.0)
Hemoglobin: 16.1 g/dL (ref 13.0–17.0)
MCHC: 33.4 g/dL (ref 30.0–36.0)
MCV: 90.4 fl (ref 78.0–100.0)
Platelets: 147 K/uL — ABNORMAL LOW (ref 150.0–400.0)
RBC: 5.35 Mil/uL (ref 4.22–5.81)
RDW: 14.5 % (ref 11.5–15.5)
WBC: 5.8 K/uL (ref 4.0–10.5)

## 2024-05-22 LAB — LIPID PANEL
Cholesterol: 240 mg/dL — ABNORMAL HIGH (ref 0–200)
HDL: 38.2 mg/dL — ABNORMAL LOW (ref >39.00)
LDL Cholesterol: 165 mg/dL — ABNORMAL HIGH (ref 0–99)
NonHDL: 201.65
Total CHOL/HDL Ratio: 6
Triglycerides: 182 mg/dL — ABNORMAL HIGH (ref 0.0–149.0)
VLDL: 36.4 mg/dL (ref 0.0–40.0)

## 2024-05-22 LAB — VITAMIN B12: Vitamin B-12: 464 pg/mL (ref 211–911)

## 2024-05-22 LAB — URIC ACID: Uric Acid, Serum: 7.1 mg/dL (ref 4.0–7.8)

## 2024-05-22 NOTE — Progress Notes (Signed)
 Established Patient Office Visit   Subjective:  Patient ID: Mcguire Gasparyan, male    DOB: [birth year]  Age: 83 y.o. MRN: [id number]  No chief complaint on file.   HPI Encounter Diagnoses  Name Primary?   B12 deficiency Yes   History of gout    Stage 3a chronic kidney disease (HCC)    Elevated LDL cholesterol level    Mostly doing okay.  Decided to hold medications he has been using for pain.  He did develop a lot of body aches and pains and will probably restart them again.  Has occasional palpitations lasting less than a minute perhaps once weekly.  They are not accompanied by shortness of breath chest pain or any difficulty with breathing.  Continues on medications as above.   Review of Systems  Constitutional: Negative.   HENT: Negative.    Eyes:  Negative for blurred vision, discharge and redness.  Respiratory: Negative.  Negative for shortness of breath.   Cardiovascular:  Positive for palpitations. Negative for chest pain.  Gastrointestinal:  Negative for abdominal pain.  Genitourinary: Negative.   Musculoskeletal:  Positive for joint pain and neck pain. Negative for myalgias.  Skin:  Negative for rash.  Neurological:  Negative for tingling, loss of consciousness and weakness.  Endo/Heme/Allergies:  Negative for polydipsia.     Current Outpatient Medications:    Accu-Chek FastClix Lancets MISC, USE TO CHECK BLOOD SUGAR UP TO 6 TIMES DAILY, Disp: 612 each, Rfl: 3   alfuzosin  (UROXATRAL ) 10 MG 24 hr tablet, TAKE 1 TABLET BY MOUTH  DAILY WITH BREAKFAST, Disp: 90 tablet, Rfl: 3   amitriptyline  (ELAVIL ) 10 MG tablet, Take 1 tablet (10 mg total) by mouth at bedtime., Disp: 90 tablet, Rfl: 1   Blood Glucose Monitoring Suppl (ACCU-CHEK AVIVA PLUS) w/Device KIT, Use as directed, Disp: 1 kit, Rfl: 0   cetirizine  (ZYRTEC ) 10 MG tablet, Take 10 mg by mouth daily as needed for allergies., Disp: , Rfl:    Continuous Blood Gluc Sensor (DEXCOM G6 SENSOR) MISC, Inject 1 Device into the skin  as directed. Apply to skin SQ every 10 days, Disp: 9 each, Rfl: 3   diclofenac  Sodium (VOLTAREN ) 1 % GEL, Apply 1 Application topically., Disp: , Rfl:    Doxycycline Hyclate 200 MG TBEC, Take by mouth in the morning and at bedtime., Disp: , Rfl:    finasteride (PROSCAR) 5 MG tablet, Take 5 mg by mouth daily., Disp: , Rfl:    fluticasone  (FLONASE) 50 MCG/ACT nasal spray, Place 1 spray into the nose daily as needed for allergies. , Disp: , Rfl:    folic acid  (FOLVITE ) 1 MG tablet, Take 1 mg by mouth daily., Disp: , Rfl:    furosemide  (LASIX ) 20 MG tablet, TAKE 1 TABLET BY MOUTH  DAILY, Disp: 90 tablet, Rfl: 3   Glucagon  (BAQSIMI  ONE PACK) 3 MG/DOSE POWD, Place 1 Device into the nose as needed (Low blood sugar with impaired consciousness)., Disp: 2 each, Rfl: 3   glucose blood (ACCU-CHEK AVIVA PLUS) test strip, USE TO TEST BLOOD SUGAR 6  TIMES PER DAY; E11.9, Disp: 600 each, Rfl: 11   Insulin  Infusion Pump Supplies (AUTOSOFT XC INFUSION SET) MISC, Use with 9mm cannula and 23 inch tubing. *5 boxes (90 day supply)  Change every 3 days, Disp: 30 each, Rfl: 6   Insulin  Infusion Pump Supplies (T:SLIM INSULIN  CARTRIDGE ) MISC, Use with insulin  pump, fill once every 2 days., Disp: 5 each, Rfl: 3   insulin  lispro (HUMALOG )  100 UNIT/ML injection, INJECT SUBCUTANEOUSLY VIA  INSULIN  PUMP TOTAL OF 120 UNITS  DAILY, Disp: 110 mL, Rfl: 3   lidocaine  (LIDODERM ) 5 %, Place 1 patch onto the skin daily. Remove & Discard patch within 12 hours or as directed by MD, Disp: 30 patch, Rfl: 0   Multiple Vitamins-Minerals (CENTRUM SILVER PO), Take 1 tablet by mouth daily., Disp: , Rfl:    Omega-3 Fatty Acids (FISH OIL) 1200 MG CAPS, Take 1,200 mg by mouth daily., Disp: , Rfl:    phenazopyridine (PYRIDIUM) 200 MG tablet, Take 200 mg by mouth 3 (three) times daily as needed., Disp: , Rfl:    rosuvastatin  (CRESTOR ) 40 MG tablet, TAKE 1 TABLET BY MOUTH AT  BEDTIME, Disp: 90 tablet, Rfl: 3   vitamin B-12 (CYANOCOBALAMIN ) 1000 MCG  tablet, Take 1,000 mcg by mouth daily., Disp: , Rfl:    Objective:     BP 126/60 (BP Location: Right Arm, Patient Position: Sitting, Cuff Size: Normal)   Pulse 93   Temp (!) 96.9 F (36.1 C) (Temporal)   Ht 5' 11 (1.803 m)   Wt 259 lb 3.2 oz (117.6 kg)   SpO2 96%   BMI 36.15 kg/m  BP Readings from Last 3 Encounters:  05/22/24 126/60  03/03/24 130/70  02/26/24 110/77   Wt Readings from Last 3 Encounters:  05/22/24 259 lb 3.2 oz (117.6 kg)  03/03/24 263 lb (119.3 kg)  02/26/24 259 lb (117.5 kg)      Physical Exam Constitutional:      General: He is not in acute distress.    Appearance: Normal appearance. He is not ill-appearing, toxic-appearing or diaphoretic.  HENT:     Head: Normocephalic and atraumatic.     Right Ear: Tympanic membrane, ear canal and external ear normal.     Left Ear: Tympanic membrane, ear canal and external ear normal.     Mouth/Throat:     Mouth: Mucous membranes are moist.     Pharynx: Oropharynx is clear. No oropharyngeal exudate or posterior oropharyngeal erythema.  Eyes:     General: No scleral icterus.       Right eye: No discharge.        Left eye: No discharge.     Extraocular Movements: Extraocular movements intact.     Right eye: Abnormal extraocular motion present.  Cardiovascular:     Rate and Rhythm: Normal rate and regular rhythm.  Pulmonary:     Effort: Pulmonary effort is normal. No respiratory distress.     Breath sounds: Normal breath sounds. No wheezing or rales.  Abdominal:     General: Bowel sounds are normal.  Musculoskeletal:     Cervical back: No rigidity or tenderness.  Skin:    General: Skin is warm and dry.  Neurological:     Mental Status: He is alert and oriented to person, place, and time.  Psychiatric:        Mood and Affect: Mood normal.        Behavior: Behavior normal.      No results found for any visits on 05/22/24.    The ASCVD Risk score (Arnett DK, et al., 2019) failed to calculate for the  following reasons:   The 2019 ASCVD risk score is only valid for ages 40 to 26    Assessment & Plan:   B12 deficiency -     CBC -     Vitamin B12  History of gout -     Comprehensive metabolic panel with  GFR -     Uric acid  Stage 3a chronic kidney disease (HCC) -     Comprehensive metabolic panel with GFR  Elevated LDL cholesterol level -     Comprehensive metabolic panel with GFR -     Lipid panel    Return in about 6 months (around 11/22/2024).  Continue current medications.  Modifications to medical regimen made pending lab results.  Let me know or RTC with any changes in the palpitations he has been experiencing, especially if increasing in frequency or duration or associated with shortness of breath or chest pain.  Information on the Shingrix vaccine was given.  Elsie Sim Lent, MD

## 2024-05-27 ENCOUNTER — Ambulatory Visit: Admitting: Physical Medicine and Rehabilitation

## 2024-06-03 ENCOUNTER — Encounter: Attending: Physical Medicine and Rehabilitation | Admitting: Physical Medicine and Rehabilitation

## 2024-06-03 ENCOUNTER — Encounter: Payer: Self-pay | Admitting: Physical Medicine and Rehabilitation

## 2024-06-03 VITALS — BP 107/65 | HR 93 | Ht 71.0 in | Wt 260.2 lb

## 2024-06-03 DIAGNOSIS — E1142 Type 2 diabetes mellitus with diabetic polyneuropathy: Secondary | ICD-10-CM | POA: Diagnosis not present

## 2024-06-03 DIAGNOSIS — M1A9XX0 Chronic gout, unspecified, without tophus (tophi): Secondary | ICD-10-CM | POA: Insufficient documentation

## 2024-06-03 DIAGNOSIS — Z794 Long term (current) use of insulin: Secondary | ICD-10-CM

## 2024-06-03 DIAGNOSIS — M5432 Sciatica, left side: Secondary | ICD-10-CM | POA: Diagnosis not present

## 2024-06-03 DIAGNOSIS — M25561 Pain in right knee: Secondary | ICD-10-CM | POA: Diagnosis not present

## 2024-06-03 DIAGNOSIS — G8929 Other chronic pain: Secondary | ICD-10-CM | POA: Insufficient documentation

## 2024-06-03 MED ORDER — LIDOCAINE 5 % EX PTCH: 1.0000 | MEDICATED_PATCH | CUTANEOUS | 0 refills | Status: AC

## 2024-06-03 NOTE — Progress Notes (Signed)
 Subjective:    Patient ID: [name], male    DOB: 1941/05/21, 83 y.o.   MRN: [id number]   HPI  1) Left sided sciatica: -he is trying to decrease his medication -did not topamax  because of potential side effect -is not taken gabapentin  but would be interested in this -the pain radiates down his left leg to past his left knee -pain ranges from 3-7 -has a back brace but does not like to use it -lidocaine  patch 5% helps a little more -pain is ok -he seldom uses the patch because he is usually sitting at home -he starts to feel pain about 2 seconds after standing -does not want to try more gabapentin  or topamax   2) Knee buckling -hurt severely, left is worse than the right -happens once in a while -had surgery for torn meniscus -his surgeon said his meniscus ripped in two -he walked out from the surgery  3) HTN: -elevated to 158/83 -does not check BP at home  4) 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   On first visit, pt complained of b/l L>R back pain.  Started ~>20 years ago.  No inciting event.  Sitting/laying improve the pain.  Standing exacerbates the pain.  Sharp, burning pain.  Radiates to left posterior thigh.  Intermittent.  He has associated numbness/tingling.  He only tried ASA, which helps some.  Pain limits from doing ADLs and fishing.  He denies falls. Back pain has been ok especially if sitting down Can't stand more than 5 or 10 minutes Tried PT and cannot feel difference -pain is worst on left.  Last Qutenza  patch helped a lot for his back but he was charged $700, [place] sent the patches to speciality pharmacy this time and they were 100% covered Qutenza  patches helped on feet where he has tingling. He has less pain in his feet except for his toes which he thinks is due to gout [name] since he was ere  last, landed on his knees He is doing well decreasing snacking after dinner 169 CBG right now.  -he usually snacks at night on potato chips after dinner -does not eat much more baked potatoes Voltaren  cream helps the back pain but does not stop it When he fishes he feels pain from the jolting- fishing made it worse -He takes advil  and this helps a little.  Sometimes it moves into his legs.   Last clinic visit on 10/21/2020.  He had trigger point injections at that time.  Since that time, patient states he had great benefit for about a week.  He uses a TENS with benefit. Patient states he feels he is getting weaker. He is taking Baclofen  1-2/week.  Sleep has improved. He is taking Elavil  25.  He states he believe he lost weight, but not exercising. Denies falls. Lightheadedness has improved.    He does not take any other medications for his pain and does not desire to. He requires a cane for ambulation and has a handicap placard.   Blood pressure is better controlled today at 120/74  He has been to multiple speciialists and was told he will not be able to see from this eye after her had a fall on this eye Has not been using SPRINT PNS but it did help when he used it.  Has been having  headaches.  -he is ready to try Qutenza  today -he has numbness and tingling from his shins down to his feet and it is constant and feels uncomfortable -his back pain has also been severe -wife accompanies him today -he has decreased sensation in both feet   Pain Inventory Average Pain 5 Pain Right Now 6 My pain is intermittent, sharp, burning, and tingling  In the last 24 hours, has pain interfered with the following? General activity 0 Relation with others 10 Enjoyment of life 10 What TIME of day is your pain at its worst? daytime Sleep (in general) Good  Pain is worse with: walking and standing Pain improves with: rest Relief from Meds: 5     Family History  Problem Relation Age of Onset    Colon cancer Brother 70   Colon polyps Brother    Lung cancer Brother    Other Mother        MVA, deceased 77s   Healthy Daughter    Healthy Son    Rectal cancer Neg Hx    Stomach cancer Neg Hx    Social History   Socioeconomic History   Marital status: Married    Spouse name: Not on file   Number of children: Not on file   Years of education: Not on file   Highest education level: Not on file  Occupational History    Employer: [employer]  Tobacco Use   Smoking status: Former    Current packs/day: 0.00    Average packs/day: 2.0 packs/day for 31.0 years (62.0 ttl pk-yrs)    Types: Cigarettes    Start date: 43    Quit date: 10/23/1982    Years since quitting: 41.6   Smokeless tobacco: Never  Vaping Use   Vaping status: Never Used  Substance and Sexual Activity   Alcohol  use: No    Alcohol /week: 0.0 standard drinks of alcohol    Drug use: No   Sexual activity: Not on file  Other Topics Concern   Not on file  Social History Narrative   Lives with wife in a one story home.  Has a son and a daughter.     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     Education: 2 years of college.   Social Drivers of Corporate investment banker Strain: Low Risk  (01/28/2024)   Overall Financial Resource Strain (CARDIA)    Difficulty of Paying Living Expenses: Not hard at all  Food Insecurity: No Food Insecurity (01/28/2024)   Hunger Vital Sign    Worried About Running Out of Food in the Last Year: Never true    Ran Out of Food in the Last Year: Never true  Transportation Needs: No Transportation Needs (01/28/2024)   PRAPARE - Administrator, Civil Service (Medical): No    Lack of Transportation (Non-Medical): No  Physical Activity: Inactive (01/28/2024)   Exercise Vital Sign    Days of Exercise per Week: 0 days    Minutes of Exercise per Session: 0 min  Stress: No Stress Concern Present (01/28/2024)   [employer] of Occupational Health - Occupational  Stress Questionnaire    Feeling of Stress : Not at all  Social Connections: Moderately Isolated (01/28/2024)   Social Connection and Isolation Panel    Frequency of Communication with Friends and Family: More than three times a week    Frequency of Social Gatherings with Friends and Family: Not on file    Attends Religious Services:  Never    Active Member of Clubs or Organizations: No    Attends Club or Organization Meetings: Never    Marital Status: Married   Past Surgical History:  Procedure Laterality Date   Abdominal US   09/1997   arm fracture Left 1958   with hardware   Colon cancer screening     COLONOSCOPY  08/18/2004   diverticulitis   COLONOSCOPY  09/24/2009   ELECTROCARDIOGRAM  02/2006   FLEXIBLE SIGMOIDOSCOPY  03/04/2001   polyps, anal fissure   GANGLION CYST EXCISION Left 1980   HIP PINNING,CANNULATED Right 04/25/2021   Procedure: CANNULATED HIP PINNING;  Surgeon: Fidel Rogue, MD;  Location: [facility];  Service: Orthopedics;  Laterality: Right;   Lower Arterial  04/13/2004   POLYPECTOMY     Rest Cardiolite  03/19/2003   Past Medical History:  Diagnosis Date   Allergy    Anal fissure    Anemia, unspecified    Arthritis    Spinal OA   BACK PAIN, LUMBAR 10/23/2007   CARDIAC MURMUR, SYSTOLIC 10/27/2008   DIABETES MELLITUS, TYPE I 05/20/2007   DIASTOLIC DYSFUNCTION 11/04/2008   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HCC)    Duodenitis    Edema 05/12/2008   Esophageal stricture    GERD (gastroesophageal reflux disease)    GOUT 05/20/2007   Gynecomastia    Hepatic steatosis    HYPERLIPIDEMIA 05/20/2007   Hypertension    Hypogonadism male    Morbid obesity (HCC) 09/10/2009   OBSTRUCTIVE SLEEP APNEA 11/04/2007   Personal history of colonic polyps    RENAL INSUFFICIENCY 05/12/2008   Sleep apnea    uses cpap   Squamous cell carcinoma of skin 02/16/2020   left lower leg, anterior-cx3,8fu   Urolithiasis    There were no vitals taken for this visit.  Opioid Risk  Score:   Fall Risk Score:  `1  Depression screen [facility] 2/9     06/03/2024    2:30 PM 02/26/2024    2:39 PM 01/28/2024   11:31 AM 10/29/2023    2:35 PM 06/26/2023    2:38 PM 05/23/2023    9:24 AM 03/21/2023    8:40 AM  Depression screen PHQ 2/9  Decreased Interest 0 0 0 0 0 0 0  Down, Depressed, Hopeless 0 0 0 0 0 0 0  PHQ - 2 Score 0 0 0 0 0 0 0  Altered sleeping   0      Tired, decreased energy   0      Change in appetite   0      Feeling bad or failure about yourself    0      Trouble concentrating   0      Moving slowly or fidgety/restless   0      Suicidal thoughts   0      PHQ-9 Score   0      Difficult doing work/chores   Not difficult at all        Review of Systems  HENT: Negative.    Eyes: Negative.   Respiratory:  Positive for shortness of breath. Negative for apnea.   Cardiovascular: Negative.   Gastrointestinal: Negative.   Endocrine:       Diabetic High blood sugar  Genitourinary: Negative.           Musculoskeletal:  Positive for arthralgias, back pain, gait problem, myalgias and neck pain.  Skin: Negative.   Allergic/Immunologic: Negative.   Neurological:  Positive for  weakness and numbness.       Tingling  Hematological: Negative.   All other systems reviewed and are negative.     Objective:   Physical Exam  Constitutional: No distress . Vital signs reviewed.  Gen: no distress, normal appearing HEENT: oral mucosa pink and moist, NCAT Cardio: Reg rate Chest: normal effort, normal rate of breathing Abd: soft, non-distended Ext: no edema Psych: pleasant, normal affect Skin: intact Musc: TTP lumbar spinous process Musc: Gait: Mildly antalgic. Ambulating with cane at all times.  Neuro: Alert  HOH             Strength          5/5 in all LE myotomes, stable 06/03/24 Decreased sensation in bilateral feet Pain is worst with walking    Assessment & Plan:  Male with pmh of OSA, CKD, HTN, DM type 1 with peripheral neuropathy presents for follow up of low  back pain > neck pain.     1. Chronic mechanical low back pain             MRIs C,L-spine early 2016 suggesting multilevel spondylosis with mild b/l multilevel foraminal narrowing C4-C7 and shallow disc bulge at L5-S1 without central canal or foraminal stenosis.   Continue lidocaine  patch  Discussed physical therapy but he prefers not to pursue  Discussed prednisone   D/c gabapentin   Discussed that pain informs regarding what movements to avoid  -discussed medial branch blocks  Discussed core strengthening exercises             Avoid NSAIDs due to CKD, discussed with patient since he takes Advil   Continue voltaren  gel, advised that he could use up to 4 times per day.              Unable to tolerate Gabapentin , Lyrica              Cont HEP  Continue topamax   Discussed steroid injections but they do not seem to help  Discussed repeat MIR of lumbar spine  Continue TENS             Discussed he does not want to do therapy             Cont tylenol   Robaxin  500 TID PRN, changed to Baclofen  10 TID PRN due to change insurance, continue   Discussed that stopped cymbalta              Cont back brace during periods of excessive activity, reminded to avoid prolonged use  Continue Lidoderm  OTC, reminded              Encouraged rest breaks             Acknowledges he can do more if he make a conscious effort             PT completed    Pain over spinous process where clothing is resulting increased pressure.  Encourage change in clothing.   Sprint PNS removed today.   -Provided with a pain relief journal and discussed that it contains foods and lifestyle tips to naturally help to improve pain. Discussed that these lifestyle strategies are also very good for health unlike some medications which can have negative side effects. Discussed that the act of keeping a journal can be therapeutic and helpful to realize patterns what helps to trigger and alleviate pain.  -Discussed Qutenza  as an option for  neuropathic pain control. Discussed that this is a capsaicin  patch, stronger than capsaicin  cream.  Discussed that it is currently approved for diabetic peripheral neuropathy and post-herpetic neuralgia, but that it has also shown benefit in treating other forms of neuropathy. Provided patient with link to site to learn more about the patch: [email or web address].qutenza .com/. Discussed that the patch would be placed in office and benefits usually last 3 months. Discussed that unintended exposure to capsaicin  can cause severe irritation of eyes, mucous membranes, respiratory tract, and skin, but that Qutenza  is a local treatment and does not have the systemic side effects of other nerve medications. Discussed that there may be pain, itching, erythema, and decreased sensory function associated with the application of Qutenza . Side effects usually subside within 1 week. A cold pack of analgesic medications can help with these side effects. Blood pressure can also be increased due to pain associated with administration of the patch.   2. Sacroiliitis             See #1             Continue brace             Not main pain generator at present, does not wish to proceed with injection at this time, particularly due to DM  Turmeric to reduce inflammation--can be used in cooking or taken as a supplement.  Benefits of turmeric:  -Highly anti-inflammatory  -Increases antioxidants  -Improves memory, attention, brain disease  -Lowers risk of heart disease  -May help prevent cancer  -Decreases pain  -Alleviates depression  -Delays aging and decreases risk of chronic disease  -Consume with black pepper to increase absorption    Turmeric Milk Recipe:  1 cup milk  1 tsp turmeric  1 tsp cinnamon  1 tsp grated ginger (optional)  Black pepper (boosts the anti-inflammatory properties of turmeric).  1 tsp honey    3. Sleep disturbance             Recently new mattress  -discussed that his phone says  he sleeps well  Continue gabapentin  300mg  TID PRN             Continue CPAP             Improved -Try to go outside near sunrise -Get exercise during the day.  -Turn off all devices an hour before bedtime.  -Teas that can benefit: chamomile, valerian root, Brahmi (Bacopa) -Can consider over the counter melatonin, magnesium , and/or L-theanine. Melatonin is an anti-oxidant with multiple health benefits. Magnesium  is involved in greater than 300 enzymatic reactions in the body and most of us  are deficient as our soil is often depleted. There are 7 different types of magnesium - Bioptemizer's is a supplement with all 7 types, and each has unique benefits. Magnesium  can also help with constipation and anxiety.  -Pistachios naturally increase the production of melatonin -Cozy Earth bamboo bed sheets are free from toxic chemicals.  -Tart cherry juice or a tart cherry supplement can improve sleep and soreness post-workout   4. Myalgia  S/p trigger point injections without enough relief   5. Diabetic neuropathy  -discussed that diabetes has been well controlled             See #1, #3            Continue elavil  to 50mg  qhs, continue 25mg  educated on signs/symptoms of seratonin syndrome previously  Provided dietary education  Refilled Cymbalta .   Continue amitriptyline  10mg  HS  -Discussed Qutenza  as an option for neuropathic pain control. Discussed that this is a capsaicin  patch, stronger  than capsaicin  cream. Discussed that it is currently approved for diabetic peripheral neuropathy and post-herpetic neuralgia, but that it has also shown benefit in treating other forms of neuropathy. Provided patient with link to site to learn more about the patch: [email or web address].qutenza .com/. Discussed that the patch would be placed in office and benefits usually last 3 months. Discussed that unintended exposure to capsaicin  can cause severe irritation of eyes, mucous membranes, respiratory tract, and skin, but that  Qutenza  is a local treatment and does not have the systemic side effects of other nerve medications. Discussed that there may be pain, itching, erythema, and decreased sensory function associated with the application of Qutenza . Side effects usually subside within 1 week. A cold pack of analgesic medications can help with these side effects. Blood pressure can also be increased due to pain associated with administration of the patch.   Recommended no snacking after dinner -Discussed current symptoms of pain and history of pain.  -Discussed benefits of exercise in reducing pain. -Discussed following foods that may reduce pain: 1) Ginger (especially studied for arthritis)- reduce leukotriene production to decrease inflammation 2) Blueberries- high in phytonutrients that decrease inflammation 3) Salmon- marine omega-3s reduce joint swelling and pain 4) Pumpkin seeds- reduce inflammation 5) dark chocolate- reduces inflammation 6) turmeric- reduces inflammation 7) tart cherries - reduce pain and stiffness 8) extra virgin olive oil - its compound olecanthal helps to block prostaglandins  9) chili peppers- can be eaten or applied topically via capsaicin  10) mint- helpful for headache, muscle aches, joint pain, and itching 11) garlic- reduces inflammation  Link to further information on diet for chronic pain: http://www.bray.com/   --Discussed Qutenza  as an option for neuropathic pain control. Discussed that this is a capsaicin  patch, stronger than capsaicin  cream. Discussed that it is currently approved for diabetic peripheral neuropathy and post-herpetic neuralgia, but that it has also shown benefit in treating other forms of neuropathy. Provided patient with link to site to learn more about the patch: [email or web address].qutenza .com/. Discussed that the patch would be placed in office and benefits usually last 3 months. Discussed that  unintended exposure to capsaicin  can cause severe irritation of eyes, mucous membranes, respiratory tract, and skin, but that Qutenza  is a local treatment and does not have the systemic side effects of other nerve medications. Discussed that there may be pain, itching, erythema, and decreased sensory function associated with the application of Qutenza . Side effects usually subside within 1 week. A cold pack of analgesic medications can help with these side effects. Blood pressure can also be increased due to pain associated with administration of the patch.   -discussed lidocaine  patches can be applied on the feet as well  -discussed that he is no longer having pain, just the tingling   6. Morbid obesity             Not interested in seeing dietitian at this time  Encouraged activity   No attempts at weight loss  Discussed Topamax .   Commended on improvement!   7. Gait abnormality             Completed therapies, cont HEP  D/c cane  Decrease amitriptyline  to 10mg    8. Skin cancer of left lateral foot: discussed that this was skin cancer and it was removed  9. Lightheadedness  Encouraged BP monitoring at home  Decrease amitriptyline  to 10mg  HS  10. Knee pain: -continue applying voltaren  gel -discussed that ne needed meniscal surgery  11. Chronic gout:  -discussed that he stopped  the allopurinol  but has not had recurrent gout flares -recommended to avoid fructose -Discussed current symptoms of pain and history of pain.  -Discussed benefits of exercise in reducing pain. -Discussed following foods that may reduce pain: 1) Ginger (especially studied for arthritis)- reduce leukotriene production to decrease inflammation 2) Blueberries- high in phytonutrients that decrease inflammation 3) Salmon- marine omega-3s reduce joint swelling and pain 4) Pumpkin seeds- reduce inflammation 5) dark chocolate- reduces inflammation 6) turmeric- reduces inflammation 7) tart cherries - reduce pain  and stiffness 8) extra virgin olive oil - its compound olecanthal helps to block prostaglandins  9) chili peppers- can be eaten or applied topically via capsaicin  10) mint- helpful for headache, muscle aches, joint pain, and itching 11) garlic- reduces inflammation 12) Green tea- reduces inflammation and oxidative stress, helps with weight loss, may reduce the risk of cancer, recommend Double Green Matcha [place] of Tea daily  Link to further information on diet for chronic pain: [email or web address].bray.com/   12. HTN: -BP is 107/65 -continue lasix  -Advised checking BP daily at home and logging results to bring into follow-up appointment with PCP and myself. -Reviewed BP meds today.  -Advised regarding healthy foods that can help lower blood pressure and provided with a list: 1) citrus foods- high in vitamins and minerals 2) salmon and other fatty fish - reduces inflammation and oxylipins 3) swiss chard (leafy green)- high level of nitrates 4) pumpkin seeds- one of the best natural sources of magnesium  5) Beans and lentils- high in fiber, magnesium , and potassium 6) Berries- high in flavonoids 7) Amaranth (whole grain, can be cooked similarly to rice and oats)- high in magnesium  and fiber 8) Pistachios- even more effective at reducing BP than other nuts 9) Carrots- high in phenolic compounds that relax blood vessels and reduce inflammation 10) Celery- contain phthalides that relax tissues of arterial walls 11) Tomatoes- can also improve cholesterol and reduce risk of heart disease 12) Broccoli- good source of magnesium , calcium , and potassium 13) Greek yogurt: high in potassium and calcium  14) Herbs and spices: Celery seed, cilantro, saffron, lemongrass, black cumin, ginseng, cinnamon, cardamom, sweet basil, and ginger 15) Chia and flax seeds- also help to lower cholesterol and blood sugar 16) Beets- high levels of nitrates  that relax blood vessels  17) spinach and bananas- high in potassium  -Provided lise of supplements that can help with hypertension:  1) magnesium : one high quality brand is Bioptemizers since it contains all 7 types of magnesium , otherwise over the counter magnesium  gluconate 400mg  is a good option 2) B vitamins 3) vitamin D  4) potassium 5) CoQ10 6) L-arginine 7) Vitamin C 8) Beetroot -Educated that goal BP is 120/80. -Made goal to incorporate some of the above foods into diet.    13) Grief -discussed that he has grief regarding the death of his daughter

## 2024-06-03 NOTE — Patient Instructions (Addendum)
 Turmeric to reduce inflammation--can be used in cooking or taken as a supplement.  Benefits of turmeric:  -Highly anti-inflammatory  -Increases antioxidants  -Improves memory, attention, brain disease  -Lowers risk of heart disease  -May help prevent cancer  -Decreases pain  -Alleviates depression  -Delays aging and decreases risk of chronic disease  -Consume with black pepper to increase absorption    Turmeric Milk Recipe:  1 cup milk  1 tsp turmeric  1 tsp cinnamon  1 tsp grated ginger (optional)  Black pepper (boosts the anti-inflammatory properties of turmeric).  1 tsp honey   HTN: -Advised checking BP daily at home and logging results to bring into follow-up appointment with PCP and myself. -Reviewed BP meds today.  -Advised regarding healthy foods that can help lower blood pressure and provided with a list: 1) citrus foods- high in vitamins and minerals 2) salmon and other fatty fish - reduces inflammation and oxylipins 3) swiss chard (leafy green)- high level of nitrates 4) pumpkin seeds- one of the best natural sources of magnesium  5) Beans and lentils- high in fiber, magnesium , and potassium 6) Berries- high in flavonoids 7) Amaranth (whole grain, can be cooked similarly to rice and oats)- high in magnesium  and fiber 8) Pistachios- even more effective at reducing BP than other nuts 9) Carrots- high in phenolic compounds that relax blood vessels and reduce inflammation 10) Celery- contain phthalides that relax tissues of arterial walls 11) Tomatoes- can also improve cholesterol and reduce risk of heart disease 12) Broccoli- good source of magnesium , calcium , and potassium 13) Greek yogurt: high in potassium and calcium  14) Herbs and spices: Celery seed, cilantro, saffron, lemongrass, black cumin, ginseng, cinnamon, cardamom, sweet basil, and ginger 15) Chia and flax seeds- also help to lower cholesterol and blood sugar 16) Beets- high levels of  nitrates that relax blood vessels  17) spinach and bananas- high in potassium  -Provided lise of supplements that can help with hypertension:  1) magnesium : one high quality brand is Bioptemizers since it contains all 7 types of magnesium , otherwise over the counter magnesium  gluconate 400mg  is a good option 2) B vitamins 3) vitamin D  4) potassium 5) CoQ10 6) L-arginine 7) Vitamin C 8) Beetroot -Educated that goal BP is 120/80. -Made goal to incorporate some of the above foods into diet.    Foods that may reduce pain: 1) Ginger (especially studied for arthritis)- reduce leukotriene production to decrease inflammation 2) Blueberries- high in phytonutrients that decrease inflammation 3) Salmon- marine omega-3s reduce joint swelling and pain 4) Pumpkin seeds- reduce inflammation 5) dark chocolate- reduces inflammation 6) turmeric- reduces inflammation 7) tart cherries - reduce pain and stiffness 8) extra virgin olive oil - its compound olecanthal helps to block prostaglandins  9) chili peppers- can be eaten or applied topically via capsaicin  10) mint- helpful for headache, muscle aches, joint pain, and itching 11) garlic- reduces inflammation 12) Green tea- reduces inflammation and oxidative stress, helps with weight loss, may reduce the risk of cancer, recommend Double Green Matcha [place] of Tea daily  Link to further information on diet for chronic pain: [email or web address]

## 2024-06-04 ENCOUNTER — Ambulatory Visit: Admitting: "Endocrinology

## 2024-06-04 ENCOUNTER — Encounter: Payer: Self-pay | Admitting: "Endocrinology

## 2024-06-04 VITALS — BP 120/80 | HR 84 | Ht 71.0 in | Wt 260.0 lb

## 2024-06-04 DIAGNOSIS — E1140 Type 2 diabetes mellitus with diabetic neuropathy, unspecified: Secondary | ICD-10-CM | POA: Diagnosis not present

## 2024-06-04 DIAGNOSIS — E782 Mixed hyperlipidemia: Secondary | ICD-10-CM

## 2024-06-04 DIAGNOSIS — Z9641 Presence of insulin pump (external) (internal): Secondary | ICD-10-CM | POA: Diagnosis not present

## 2024-06-04 DIAGNOSIS — Z794 Long term (current) use of insulin: Secondary | ICD-10-CM

## 2024-06-04 LAB — POCT GLYCOSYLATED HEMOGLOBIN (HGB A1C): Hemoglobin A1C: 6.7 % — AB (ref 4.0–5.6)

## 2024-06-04 MED ORDER — BLOOD GLUCOSE MONITORING SUPPL DEVI: 1.0000 | Freq: Three times a day (TID) | 0 refills | Status: AC

## 2024-06-04 MED ORDER — LANCETS MISC. MISC: 1.0000 | Freq: Three times a day (TID) | 3 refills | Status: AC

## 2024-06-04 MED ORDER — BLOOD GLUCOSE TEST VI STRP: 1.0000 | ORAL_STRIP | Freq: Three times a day (TID) | 3 refills | Status: AC

## 2024-06-04 MED ORDER — LANCET DEVICE MISC: 1.0000 | Freq: Three times a day (TID) | 0 refills | Status: AC

## 2024-06-04 NOTE — Patient Instructions (Signed)

## 2024-06-04 NOTE — Progress Notes (Signed)
 [facility] Note  [name] 12/20/1940 [id number]  Referring Provider: @REFPROVFULL @ Primary Care Provider: Berneta Elsie Sayre, MD Reason for consultation: No chief complaint on file.   Assessment & Plan Diagnoses and all orders for this visit:  Type 2 diabetes mellitus with diabetic neuropathy, with long-term current use of insulin  (HCC) -     POCT glycosylated hemoglobin (Hb A1C)  Insulin  pump in place  Mixed hypercholesterolemia and hypertriglyceridemia  Other orders -     Blood Glucose Monitoring Suppl DEVI; 1 each by Does not apply route in the morning, at noon, and at bedtime. May substitute to any manufacturer covered by patient's insurance. -     Glucose Blood (BLOOD GLUCOSE TEST STRIPS) STRP; 1 each by In Vitro route in the morning, at noon, and at bedtime. May substitute to any manufacturer covered by patient's insurance. -     Lancet Device MISC; 1 each by Does not apply route in the morning, at noon, and at bedtime. May substitute to any manufacturer covered by patient's insurance. -     Lancets Misc. MISC; 1 each by Does not apply route in the morning, at noon, and at bedtime. May substitute to any manufacturer covered by patient's insurance.   Type 2 diabetes mellitus Hba1c goal less than 7.0, current Hba1c is 6.7. Will recommend:  Insulin  Pump Settings:  Continue settings as follows: HUMALOG  Insulin  with pump: T-slim control IQ, DexCom G6  BASAL settings: 12-8 am = 1.65, 8-12 noon= 1.8, Noon to 12 MN = 1.75 Carb 1:3 (1:2.5 from noon-5pm and 1.4 from 5pm-MN)  correction factor 1: 25>100 Active insulin  5 hours (on Control IQ)  Bolus 5 min before each meal  03/03/24: advised against lows. Discussed and prescribed baqsimi    -check blood sugars before each meals and at bedtime -enter blood sugars in the pump and calibrate three times a day. Calibrate when fasting and at bedtime and one calibration before the meal -avoid calibrations when the  blood sugar is rising rapidly or falling -pre bolus carbs when eating. Pre bolus needs to be 5 minutes before the meal -do not enter false or fake carbohydrates -do not enter carbohydratess after you eat and try to catch up with a high blood sugar -change sets on time to have consistent insulin  absorption and prevent scar formation -call the office /answering service for blood sugar questions regarding hyper or hypoglycemia or sensor and transmitter issues -insulin  settings updated and scanned in chart  No known contraindications to any of above medications  Hyperlipidemia -Last LDL off goal: 73 -on rosuvastatin  40 mg every day, fish oil (avoiding ezetimibe  due to risk of statin induced myopathy given current GFR) -stopped repatha  140 mg q14 days due to UTIs -Follow low fat diet and exercise, pt likes fried foods  -ordered repeat lab   -Blood pressure goal <140/90 - Microalbumin/creatinine goal < 30 -on losartan 25 mg every day -diet changes including salt restriction -limit eating outside -counseled BP targets per standards of diabetes care -Uncontrolled blood pressure can lead to retinopathy, nephropathy and cardiovascular and atherosclerotic heart disease  Reviewed and counseled on: -A1C target -Blood sugar targets -Complications of uncontrolled diabetes  -Checking blood sugar before meals and bedtime and bring log next visit -All medications with mechanism of action and side effects -Hypoglycemia management: rule of 15's, Glucagon  Emergency Kit and medical alert ID -low-carb low-fat plate-method diet -At least 20 minutes of physical activity per day -Annual dilated retinal eye exam and foot exam -  compliance and follow up needs -follow up as scheduled or earlier if problem gets worse  Call if blood sugar is less than 70 or consistently above 250    Take a 15 gm snack of carbohydrate at bedtime before you go to sleep if your blood sugar is less than 100.    If you are  going to fast after midnight for a test or procedure, ask your physician for instructions on how to reduce/decrease your insulin  dose.    Call if blood sugar is less than 70 or consistently above 250  -Treating a low sugar by rule of 15  (15 gms of sugar every 15 min until sugar is more than 70) If you feel your sugar is low, test your sugar to be sure If your sugar is low (less than 70), then take 15 grams of a fast acting Carbohydrate (3-4 glucose tablets or glucose gel or 4 ounces of juice or regular soda) Recheck your sugar 15 min after treating low to make sure it is more than 70 If sugar is still less than 70, treat again with 15 grams of carbohydrate          Don't drive the hour of hypoglycemia  If unconscious/unable to eat or drink by mouth, use glucagon  injection or nasal spray baqsimi  and call 911. Can repeat again in 15 min if still unconscious.   I have reviewed current medications, nurse's notes, allergies, vital signs, past medical and surgical history, family medical history, and social history for this encounter. Counseled patient on symptoms, examination findings, lab findings, imaging results, treatment decisions and monitoring and prognosis. The patient understood the recommendations and agrees with the treatment plan. All questions regarding treatment plan were fully answered.  Return in about 3 months (around 09/04/2024).   [name] Birmingham, MD 83/13/25   History of Present Illness [name] is an 83 y.o. year old male who presents to our clinic for a follow up for Type 2 diabetes mellitus. [name] was first diagnosed in 1986.  Previous history: Insulin  was started in 1995  Recent history:     Non-insulin  hypoglycemic drugs: None     Insulin  pump: T-slim control IQ       BASAL settings: 12-8 am = 1.65, 8-12 noon= 1.8, Noon to 12 MN = 1.75 Carb Carb 1:3 (1:25 from noon-5pm)  Active insulin  5 hours, correction factor 1:25>100  Ambulatory BG Monitoring CGM  interpretation: At today's visit, we reviewed her CGM downloads. The full report is scanned in the media. Reviewing the CGM trends, BG are well controlled across the day, with some highs.  Insulin  pump  Pump downloaded and reviewed. Blood sugars reviewed Basal rates reviewed Auto mode % reviewed Manual mode % reviewed Sensor wear % reviewed Readings above target % reviewed Readings at target % reviewed Readings below target % reviewed TDD units of insulin  /day reviewed Changing sets on time reviewed Carb counting reviewed Basal bolus ratio reviewed Basal % reviewed Bolus % reviewed Duration of pump suspends reviewed No reaction at insulin  pump sites Set change=every 3 days Reservoir change = every 3 days  Physical Exam  BP 120/80   Pulse 84   Ht 5' 11 (1.803 m)   Wt 260 lb (117.9 kg)   SpO2 96%   BMI 36.26 kg/m    Constitutional: well developed, well nourished Head: normocephalic, atraumatic Eyes: sclera anicteric, no redness Neck: supple Lungs: normal respiratory effort Neurology: alert and oriented Skin: dry, no appreciable rashes Musculoskeletal: no  appreciable defects Psychiatric: normal mood and affect  Current Medications Patient's Medications  New Prescriptions   BLOOD GLUCOSE MONITORING SUPPL DEVI    1 each by Does not apply route in the morning, at noon, and at bedtime. May substitute to any manufacturer covered by patient's insurance.   GLUCOSE BLOOD (BLOOD GLUCOSE TEST STRIPS) STRP    1 each by In Vitro route in the morning, at noon, and at bedtime. May substitute to any manufacturer covered by patient's insurance.   LANCET DEVICE MISC    1 each by Does not apply route in the morning, at noon, and at bedtime. May substitute to any manufacturer covered by patient's insurance.   LANCETS MISC. MISC    1 each by Does not apply route in the morning, at noon, and at bedtime. May substitute to any manufacturer covered by patient's insurance.  Previous  Medications   ACCU-CHEK FASTCLIX LANCETS MISC    USE TO CHECK BLOOD SUGAR UP TO 6 TIMES DAILY   ALFUZOSIN  (UROXATRAL ) 10 MG 24 HR TABLET    TAKE 1 TABLET BY MOUTH  DAILY WITH BREAKFAST   AMITRIPTYLINE  (ELAVIL ) 10 MG TABLET    Take 1 tablet (10 mg total) by mouth at bedtime.   BLOOD GLUCOSE MONITORING SUPPL (ACCU-CHEK AVIVA PLUS) W/DEVICE KIT    Use as directed   CETIRIZINE  (ZYRTEC ) 10 MG TABLET    Take 10 mg by mouth daily as needed for allergies.   CONTINUOUS BLOOD GLUC SENSOR (DEXCOM G6 SENSOR) MISC    Inject 1 Device into the skin as directed. Apply to skin SQ every 10 days   DICLOFENAC  SODIUM (VOLTAREN ) 1 % GEL    Apply 1 Application topically.   DOXYCYCLINE HYCLATE 200 MG TBEC    Take by mouth in the morning and at bedtime.   FINASTERIDE (PROSCAR) 5 MG TABLET    Take 5 mg by mouth daily.   FLUTICASONE  (FLONASE) 50 MCG/ACT NASAL SPRAY    Place 1 spray into the nose daily as needed for allergies.    FOLIC ACID  (FOLVITE ) 1 MG TABLET    Take 1 mg by mouth daily.   FUROSEMIDE  (LASIX ) 20 MG TABLET    TAKE 1 TABLET BY MOUTH  DAILY   GLUCAGON  (BAQSIMI  ONE PACK) 3 MG/DOSE POWD    Place 1 Device into the nose as needed (Low blood sugar with impaired consciousness).   GLUCOSE BLOOD (ACCU-CHEK AVIVA PLUS) TEST STRIP    USE TO TEST BLOOD SUGAR 6  TIMES PER DAY; E11.9   INSULIN  INFUSION PUMP SUPPLIES (AUTOSOFT XC INFUSION SET) MISC    Use with 9mm cannula and 23 inch tubing. *5 boxes (90 day supply)  Change every 3 days   INSULIN  INFUSION PUMP SUPPLIES (T:SLIM INSULIN  CARTRIDGE ) MISC    Use with insulin  pump, fill once every 2 days.   INSULIN  LISPRO (HUMALOG ) 100 UNIT/ML INJECTION    INJECT SUBCUTANEOUSLY VIA  INSULIN  PUMP TOTAL OF 120 UNITS  DAILY   LIDOCAINE  (LIDODERM ) 5 %    Place 1 patch onto the skin daily. Remove & Discard patch within 12 hours or as directed by MD   MULTIPLE VITAMINS-MINERALS (CENTRUM SILVER PO)    Take 1 tablet by mouth daily.   OMEGA-3 FATTY ACIDS (FISH OIL) 1200 MG CAPS     Take 1,200 mg by mouth daily.   PHENAZOPYRIDINE (PYRIDIUM) 200 MG TABLET    Take 200 mg by mouth 3 (three) times daily as needed.   ROSUVASTATIN  (CRESTOR )  40 MG TABLET    TAKE 1 TABLET BY MOUTH AT  BEDTIME   VITAMIN B-12 (CYANOCOBALAMIN ) 1000 MCG TABLET    Take 1,000 mcg by mouth daily.  Modified Medications   No medications on file  Discontinued Medications   No medications on file    Allergies Allergies  Allergen Reactions   Atorvastatin Calcium     Lipitor [Atorvastatin] Other (See Comments)    unknown   Niacin     REACTION: Severe heartburn   Pioglitazone     REACTION: Edema    Past Medical History Past Medical History:  Diagnosis Date   Allergy    Anal fissure    Anemia, unspecified    Arthritis    Spinal OA   BACK PAIN, LUMBAR 10/23/2007   CARDIAC MURMUR, SYSTOLIC 10/27/2008   DIABETES MELLITUS, TYPE I 05/20/2007   DIASTOLIC DYSFUNCTION 11/04/2008   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HCC)    Duodenitis    Edema 05/12/2008   Esophageal stricture    GERD (gastroesophageal reflux disease)    GOUT 05/20/2007   Gynecomastia    Hepatic steatosis    HYPERLIPIDEMIA 05/20/2007   Hypertension    Hypogonadism male    Morbid obesity (HCC) 09/10/2009   OBSTRUCTIVE SLEEP APNEA 11/04/2007   Personal history of colonic polyps    RENAL INSUFFICIENCY 05/12/2008   Sleep apnea    uses cpap   Squamous cell carcinoma of skin 02/16/2020   left lower leg, anterior-cx3,59fu   Urolithiasis     Past Surgical History Past Surgical History:  Procedure Laterality Date   Abdominal US   09/1997   arm fracture Left 1958   with hardware   Colon cancer screening     COLONOSCOPY  08/18/2004   diverticulitis   COLONOSCOPY  09/24/2009   ELECTROCARDIOGRAM  02/2006   FLEXIBLE SIGMOIDOSCOPY  03/04/2001   polyps, anal fissure   GANGLION CYST EXCISION Left 1980   HIP PINNING,CANNULATED Right 04/25/2021   Procedure: CANNULATED HIP PINNING;  Surgeon: Fidel Rogue, MD;  Location: [facility];   Service: Orthopedics;  Laterality: Right;   Lower Arterial  04/13/2004   POLYPECTOMY     Rest Cardiolite  03/19/2003    Family History family history includes Colon cancer (age of onset: 53) in his brother; Colon polyps in his brother; Healthy in his daughter and son; Lung cancer in his brother; Other in his mother.  Social History Social History   Socioeconomic History   Marital status: Married    Spouse name: Not on file   Number of children: Not on file   Years of education: Not on file   Highest education level: Not on file  Occupational History    Employer: [employer]  Tobacco Use   Smoking status: Former    Current packs/day: 0.00    Average packs/day: 2.0 packs/day for 31.0 years (62.0 ttl pk-yrs)    Types: Cigarettes    Start date: 53    Quit date: 10/23/1982    Years since quitting: 41.6   Smokeless tobacco: Never  Vaping Use   Vaping status: Never Used  Substance and Sexual Activity   Alcohol  use: No    Alcohol /week: 0.0 standard drinks of alcohol    Drug use: No   Sexual activity: Not on file  Other Topics Concern   Not on file  Social History Narrative   Lives with wife in a one story home.  Has a son and a daughter.     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     Education: 2 years of college.   Social Drivers of Corporate investment banker Strain: Low Risk  (01/28/2024)   Overall Financial Resource Strain (CARDIA)    Difficulty of Paying Living Expenses: Not hard at all  Food Insecurity: No Food Insecurity (01/28/2024)   Hunger Vital Sign    Worried About Running Out of Food in the Last Year: Never true    Ran Out of Food in the Last Year: Never true  Transportation Needs: No Transportation Needs (01/28/2024)   PRAPARE - Administrator, Civil Service (Medical): No    Lack of Transportation (Non-Medical): No  Physical Activity: Inactive (01/28/2024)   Exercise Vital Sign    Days of Exercise per Week: 0 days    Minutes of  Exercise per Session: 0 min  Stress: No Stress Concern Present (01/28/2024)   [employer] of Occupational Health - Occupational Stress Questionnaire    Feeling of Stress : Not at all  Social Connections: Moderately Isolated (01/28/2024)   Social Connection and Isolation Panel    Frequency of Communication with Friends and Family: More than three times a week    Frequency of Social Gatherings with Friends and Family: Not on file    Attends Religious Services: Never    Active Member of Clubs or Organizations: No    Attends Banker Meetings: Never    Marital Status: Married  Catering manager Violence: Not At Risk (01/28/2024)   Humiliation, Afraid, Rape, and Kick questionnaire    Fear of Current or Ex-Partner: No    Emotionally Abused: No    Physically Abused: No    Sexually Abused: No    Lab Results  Component Value Date   HGBA1C 6.7 (A) 06/04/2024   Lab Results  Component Value Date   CHOL 240 (H) 05/22/2024   Lab Results  Component Value Date   HDL 38.20 (L) 05/22/2024   Lab Results  Component Value Date   LDLCALC 165 (H) 05/22/2024   Lab Results  Component Value Date   TRIG 182.0 (H) 05/22/2024   Lab Results  Component Value Date   CHOLHDL 6 05/22/2024   Lab Results  Component Value Date   CREATININE 1.64 (H) 05/22/2024   Lab Results  Component Value Date   MICROALBUR 3.4 03/03/2024    Parts of this note may have been dictated using voice recognition software. There may be variances in spelling and vocabulary which are unintentional. Not all errors are proofread. Please notify the dino if any discrepancies are noted or if the meaning of any statement is not clear.

## 2024-06-05 ENCOUNTER — Encounter: Payer: Self-pay | Admitting: "Endocrinology

## 2024-06-24 LAB — HM DIABETES EYE EXAM

## 2024-06-27 ENCOUNTER — Encounter: Payer: Self-pay | Admitting: Family Medicine

## 2024-08-19 ENCOUNTER — Encounter: Payer: Self-pay | Admitting: Podiatry

## 2024-08-19 ENCOUNTER — Ambulatory Visit (INDEPENDENT_AMBULATORY_CARE_PROVIDER_SITE_OTHER): Admitting: Podiatry

## 2024-08-19 ENCOUNTER — Other Ambulatory Visit: Payer: Self-pay | Admitting: Physical Medicine and Rehabilitation

## 2024-08-19 DIAGNOSIS — B351 Tinea unguium: Secondary | ICD-10-CM | POA: Diagnosis not present

## 2024-08-19 DIAGNOSIS — M79674 Pain in right toe(s): Secondary | ICD-10-CM | POA: Diagnosis not present

## 2024-08-19 DIAGNOSIS — E1142 Type 2 diabetes mellitus with diabetic polyneuropathy: Secondary | ICD-10-CM

## 2024-08-19 DIAGNOSIS — Z794 Long term (current) use of insulin: Secondary | ICD-10-CM

## 2024-08-19 DIAGNOSIS — M79675 Pain in left toe(s): Secondary | ICD-10-CM

## 2024-08-19 NOTE — Progress Notes (Signed)
This patient returns to my office for at risk foot care.  This patient requires this care by a professional since this patient will be at risk due to having  diabetes.  This patient is unable to cut nails himself since the patient cannot reach his nails.These nails are painful walking and wearing shoes.  This patient presents for at risk foot care today.  General Appearance  Alert, conversant and in no acute stress.  Vascular  Dorsalis pedis and posterior tibial  pulses are palpable  bilaterally.  Capillary return is within normal limits  bilaterally. Temperature is within normal limits  bilaterally.  Neurologic  Senn-Weinstein monofilament wire test within normal limits  bilaterally. Muscle power within normal limits bilaterally.  Nails Thick disfigured discolored nails with subungual debris  hallux nails bilaterally. No evidence of bacterial infection or drainage bilaterally.  Orthopedic  No limitations of motion  feet .  No crepitus or effusions noted.  No bony pathology or digital deformities noted.  Skin  normotropic skin with no porokeratosis noted bilaterally.  No signs of infections or ulcers noted.     Onychomycosis  Pain in right toes  Pain in left toes  Consent was obtained for treatment procedures.   Mechanical debridement of nails 1-5  bilaterally performed with a nail nipper.  Filed with dremel without incident.    Return office visit  4 months                    Told patient to return for periodic foot care and evaluation due to potential at risk complications.   Gardiner Barefoot DPM

## 2024-09-02 ENCOUNTER — Encounter: Payer: Self-pay | Admitting: "Endocrinology

## 2024-09-02 ENCOUNTER — Ambulatory Visit: Admitting: "Endocrinology

## 2024-09-02 VITALS — BP 120/80 | HR 74 | Ht 71.0 in | Wt 266.0 lb

## 2024-09-02 DIAGNOSIS — Z794 Long term (current) use of insulin: Secondary | ICD-10-CM

## 2024-09-02 DIAGNOSIS — Z9641 Presence of insulin pump (external) (internal): Secondary | ICD-10-CM | POA: Diagnosis not present

## 2024-09-02 DIAGNOSIS — E782 Mixed hyperlipidemia: Secondary | ICD-10-CM

## 2024-09-02 DIAGNOSIS — E1140 Type 2 diabetes mellitus with diabetic neuropathy, unspecified: Secondary | ICD-10-CM | POA: Diagnosis not present

## 2024-09-02 LAB — POCT GLYCOSYLATED HEMOGLOBIN (HGB A1C): Hemoglobin A1C: 6.6 % — AB (ref 4.0–5.6)

## 2024-09-02 MED ORDER — SEMAGLUTIDE(0.25 OR 0.5MG/DOS) 2 MG/3ML ~~LOC~~ SOPN: 0.2500 mg | PEN_INJECTOR | SUBCUTANEOUS | 2 refills | Status: AC

## 2024-09-02 NOTE — Progress Notes (Signed)
 [facility] Note  [name] 83-13-1942 [id number]  Referring Provider: @REFPROVFULL @ Primary Care Provider: Berneta Elsie Sayre, MD Reason for consultation: No chief complaint on file.   Assessment & Plan Diagnoses and all orders for this visit:  Type 2 diabetes mellitus with diabetic neuropathy, with long-term current use of insulin  (HCC) -     POCT glycosylated hemoglobin (Hb A1C)  Insulin  pump in place  Mixed hypercholesterolemia and hypertriglyceridemia  Other orders -     Semaglutide ,0.25 or 0.5MG /DOS, 2 MG/3ML SOPN; Inject 0.25 mg into the skin once a week. 0.25mg  weekly for 4 weeks followed by 0.5mg  for 4 weeks   Type 2 diabetes mellitus Hba1c goal less than 7.0, current Hba1c is Lab Results  Component Value Date   HGBA1C 6.6 (A) 09/02/2024   HGBA1C 6.7 (A) 06/04/2024   HGBA1C 6.7 (A) 03/03/2024   Will recommend: Insulin  Pump Settings:  Continue settings as follows: 09/02/24: Start ozempic  0.25mg /week HUMALOG  Insulin  with pump: [name], [name]  BASAL settings: 12-8 am = 1.65, 8-12 noon= 1.8, Noon to 12 MN = 1.75 Carb 1:3 (1:2.5 from noon-5pm and 1.4 from 5pm-MN)  correction factor 1: 25>100 Active insulin  3 hours (on Control Key Reinforced bolusing 5 min before meals-made alarms for remembering   No history of MEN syndrome/medullary thyroid  cancer/pancreatitis or pancreatic cancer in self or family Took ozempic  in past without issues   Bolus 5 min before each meal  03/03/24: advised against lows. Discussed and prescribed baqsimi    -check blood sugars before each meals and at bedtime -enter blood sugars in the pump and calibrate three times a day. Calibrate when fasting and at bedtime and one calibration before the meal -avoid calibrations when the blood sugar is rising rapidly or falling -pre bolus carbs when eating. Pre bolus needs to be 5 minutes before the meal -do not enter false or fake carbohydrates -do not enter  carbohydratess after you eat and try to catch up with a high blood sugar -change sets on time to have consistent insulin  absorption and prevent scar formation -call the office /answering service for blood sugar questions regarding hyper or hypoglycemia or sensor and transmitter issues -insulin  settings updated and scanned in chart  No known contraindications to any of above medications  Hyperlipidemia -Last LDL off goal: 73 -on rosuvastatin  40 mg every day, fish oil (avoiding ezetimibe  due to risk of statin induced myopathy given current GFR) -stopped repatha  140 mg q14 days due to UTIs -Follow low fat diet and exercise, pt likes fried foods  -ordered repeat lab   -Blood pressure goal <140/90 - Microalbumin/creatinine goal < 30 -on losartan 25 mg every day -diet changes including salt restriction -limit eating outside -counseled BP targets per standards of diabetes care -Uncontrolled blood pressure can lead to retinopathy, nephropathy and cardiovascular and atherosclerotic heart disease  Reviewed and counseled on: -A1C target -Blood sugar targets -Complications of uncontrolled diabetes  -Checking blood sugar before meals and bedtime and bring log next visit -All medications with mechanism of action and side effects -Hypoglycemia management: rule of 15's, Glucagon  Emergency Kit and medical alert ID -low-carb low-fat plate-method diet -At least 20 minutes of physical activity per day -Annual dilated retinal eye exam and foot exam -compliance and follow up needs -follow up as scheduled or earlier if problem gets worse  Call if blood sugar is less than 70 or consistently above 250    Take a 15 gm snack of carbohydrate at bedtime before you  go to sleep if your blood sugar is less than 100.    If you are going to fast after midnight for a test or procedure, ask your physician for instructions on how to reduce/decrease your insulin  dose.    Call if blood sugar is less than 70 or  consistently above 250  -Treating a low sugar by rule of 15  (15 gms of sugar every 15 min until sugar is more than 70) If you feel your sugar is low, test your sugar to be sure If your sugar is low (less than 70), then take 15 grams of a fast acting Carbohydrate (3-4 glucose tablets or glucose gel or 4 ounces of juice or regular soda) Recheck your sugar 15 min after treating low to make sure it is more than 70 If sugar is still less than 70, treat again with 15 grams of carbohydrate          Don't drive the hour of hypoglycemia  If unconscious/unable to eat or drink by mouth, use glucagon  injection or nasal spray baqsimi  and call 911. Can repeat again in 15 min if still unconscious.   I have reviewed current medications, nurse's notes, allergies, vital signs, past medical and surgical history, family medical history, and social history for this encounter. Counseled patient on symptoms, examination findings, lab findings, imaging results, treatment decisions and monitoring and prognosis. The patient understood the recommendations and agrees with the treatment plan. All questions regarding treatment plan were fully answered.  Return in about 4 weeks (around 09/30/2024).   [name] Birmingham, MD 09/02/24   History of Present Illness [name] is a 83 y.o. year old male who presents to our clinic for a follow up for Type 2 diabetes mellitus. [name] was first diagnosed in 1986.  Previous history: Insulin  was started in 1995  Recent history:     Non-insulin  hypoglycemic drugs: None     Insulin  pump: [name]       BASAL settings: 12-8 am = 1.65, 8-12 noon= 1.8, Noon to 12 MN = 1.75 Carb Carb 1:3 (1:25 from noon-5pm)  Active insulin  3 hours, correction factor 1:25>100  Ambulatory BG Monitoring CGM interpretation: At today's visit, we reviewed her CGM downloads. The full report is scanned in the media. Reviewing the CGM trends, BG are high with meals/daytime and improve  overnight.  Insulin  pump  Pump downloaded and reviewed. Blood sugars reviewed Basal rates reviewed Auto mode % reviewed Manual mode % reviewed Sensor wear % reviewed Readings above target % reviewed Readings at target % reviewed Readings below target % reviewed TDD units of insulin  /day reviewed Changing sets on time reviewed Carb counting reviewed Basal bolus ratio reviewed Basal % reviewed Bolus % reviewed Duration of pump suspends reviewed No reaction at insulin  pump sites Set change=every 3 days Reservoir change = every 3 days  Physical Exam  BP 120/80   Pulse 74   Ht 5' 11 (1.803 m)   Wt 266 lb (120.7 kg)   SpO2 96%   BMI 37.10 kg/m    Constitutional: well developed, well nourished Head: normocephalic, atraumatic Eyes: sclera anicteric, no redness Neck: supple Lungs: normal respiratory effort Neurology: alert and oriented Skin: dry, no appreciable rashes Musculoskeletal: no appreciable defects Psychiatric: normal mood and affect  Current Medications Patient's Medications  New Prescriptions   SEMAGLUTIDE ,0.25 OR 0.5MG /DOS, 2 MG/3ML SOPN    Inject 0.25 mg into the skin once a week. 0.25mg  weekly for 4 weeks followed by 0.5mg  for  4 weeks  Previous Medications   ACCU-CHEK FASTCLIX LANCETS MISC    USE TO CHECK BLOOD SUGAR UP TO 6 TIMES DAILY   ALFUZOSIN  (UROXATRAL ) 10 MG 24 HR TABLET    TAKE 1 TABLET BY MOUTH  DAILY WITH BREAKFAST   AMITRIPTYLINE  (ELAVIL ) 10 MG TABLET    TAKE 1 TABLET(10 MG) BY MOUTH AT BEDTIME   BLOOD GLUCOSE MONITORING SUPPL (ACCU-CHEK AVIVA PLUS) W/DEVICE KIT    Use as directed   BLOOD GLUCOSE MONITORING SUPPL DEVI    1 each by Does not apply route in the morning, at noon, and at bedtime. May substitute to any manufacturer covered by patient's insurance.   CETIRIZINE  (ZYRTEC ) 10 MG TABLET    Take 10 mg by mouth daily as needed for allergies.   CONTINUOUS BLOOD GLUC SENSOR ([name] SENSOR) MISC    Inject 1 Device into the skin as directed.  Apply to skin SQ every 10 days   DICLOFENAC  SODIUM (VOLTAREN ) 1 % GEL    Apply 1 Application topically.   DOXYCYCLINE HYCLATE 200 MG TBEC    Take by mouth in the morning and at bedtime.   FINASTERIDE (PROSCAR) 5 MG TABLET    Take 5 mg by mouth daily.   FLUTICASONE  (FLONASE) 50 MCG/ACT NASAL SPRAY    Place 1 spray into the nose daily as needed for allergies.    FOLIC ACID  (FOLVITE ) 1 MG TABLET    Take 1 mg by mouth daily.   FUROSEMIDE  (LASIX ) 20 MG TABLET    TAKE 1 TABLET BY MOUTH  DAILY   GLUCAGON  (BAQSIMI  ONE PACK) 3 MG/DOSE POWD    Place 1 Device into the nose as needed (Low blood sugar with impaired consciousness).   GLUCOSE BLOOD (ACCU-CHEK AVIVA PLUS) TEST STRIP    USE TO TEST BLOOD SUGAR 6  TIMES PER DAY; E11.9   GLUCOSE BLOOD (BLOOD GLUCOSE TEST STRIPS) STRP    1 each by In Vitro route in the morning, at noon, and at bedtime. May substitute to any manufacturer covered by patient's insurance.   INSULIN  INFUSION PUMP SUPPLIES (AUTOSOFT XC INFUSION SET) MISC    Use with 9mm cannula and 23 inch tubing. *5 boxes (90 day supply)  Change every 3 days   INSULIN  INFUSION PUMP SUPPLIES (T:SLIM INSULIN  CARTRIDGE ) MISC    Use with insulin  pump, fill once every 2 days.   INSULIN  LISPRO (HUMALOG ) 100 UNIT/ML INJECTION    INJECT SUBCUTANEOUSLY VIA  INSULIN  PUMP TOTAL OF 120 UNITS  DAILY   LIDOCAINE  (LIDODERM ) 5 %    Place 1 patch onto the skin daily. Remove & Discard patch within 12 hours or as directed by MD   MULTIPLE VITAMINS-MINERALS (CENTRUM SILVER PO)    Take 1 tablet by mouth daily.   OMEGA-3 FATTY ACIDS (FISH OIL) 1200 MG CAPS    Take 1,200 mg by mouth daily.   PHENAZOPYRIDINE (PYRIDIUM) 200 MG TABLET    Take 200 mg by mouth 3 (three) times daily as needed.   ROSUVASTATIN  (CRESTOR ) 40 MG TABLET    TAKE 1 TABLET BY MOUTH AT  BEDTIME   VITAMIN B-12 (CYANOCOBALAMIN ) 1000 MCG TABLET    Take 1,000 mcg by mouth daily.  Modified Medications   No medications on file  Discontinued Medications   No  medications on file    Allergies Allergies  Allergen Reactions   Atorvastatin Calcium     Lipitor [Atorvastatin] Other (See Comments)    unknown   Niacin  REACTION: Severe heartburn   Pioglitazone     REACTION: Edema    Past Medical History Past Medical History:  Diagnosis Date   Allergy    Anal fissure    Anemia, unspecified    Arthritis    Spinal OA   BACK PAIN, LUMBAR 10/23/2007   CARDIAC MURMUR, SYSTOLIC 10/27/2008   DIABETES MELLITUS, TYPE I 05/20/2007   DIASTOLIC DYSFUNCTION 11/04/2008   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HCC)    Duodenitis    Edema 05/12/2008   Esophageal stricture    GERD (gastroesophageal reflux disease)    GOUT 05/20/2007   Gynecomastia    Hepatic steatosis    HYPERLIPIDEMIA 05/20/2007   Hypertension    Hypogonadism male    Morbid obesity (HCC) 09/10/2009   OBSTRUCTIVE SLEEP APNEA 11/04/2007   Personal history of colonic polyps    RENAL INSUFFICIENCY 05/12/2008   Sleep apnea    uses cpap   Squamous cell carcinoma of skin 02/16/2020   left lower leg, anterior-[id number]   Urolithiasis     Past Surgical History Past Surgical History:  Procedure Laterality Date   Abdominal US   09/1997   arm fracture Left 1958   with hardware   Colon cancer screening     COLONOSCOPY  08/18/2004   diverticulitis   COLONOSCOPY  09/24/2009   ELECTROCARDIOGRAM  02/2006   FLEXIBLE SIGMOIDOSCOPY  03/04/2001   polyps, anal fissure   GANGLION CYST EXCISION Left 1980   HIP PINNING,CANNULATED Right 04/25/2021   Procedure: CANNULATED HIP PINNING;  Surgeon: Fidel Rogue, MD;  Location: [facility];  Service: Orthopedics;  Laterality: Right;   Lower Arterial  04/13/2004   POLYPECTOMY     Rest Cardiolite  03/19/2003    Family History family history includes Colon cancer (age of onset: 102) in his brother; Colon polyps in his brother; Healthy in his daughter and son; Lung cancer in his brother; Other in his mother.  Social History Social History    Socioeconomic History   Marital status: Married    Spouse name: Not on file   Number of children: Not on file   Years of education: Not on file   Highest education level: Not on file  Occupational History    Employer: [employer]  Tobacco Use   Smoking status: Former    Current packs/day: 0.00    Average packs/day: 2.0 packs/day for 31.0 years (62.0 ttl pk-yrs)    Types: Cigarettes    Start date: 19    Quit date: 10/23/1982    Years since quitting: 41.8   Smokeless tobacco: Never  Vaping Use   Vaping status: Never Used  Substance and Sexual Activity   Alcohol  use: No    Alcohol /week: 0.0 standard drinks of alcohol    Drug use: No   Sexual activity: Not on file  Other Topics Concern   Not on file  Social History Narrative   Lives with wife in a one story home.  Has a son and a daughter.     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     Education: 2 years of college.   Social Drivers of Corporate Investment Banker Strain: Low Risk  (01/28/2024)   Overall Financial Resource Strain (CARDIA)    Difficulty of Paying Living Expenses: Not hard at all  Food Insecurity: No Food Insecurity (01/28/2024)   Hunger Vital Sign    Worried About Running Out of Food in the Last Year: Never true  Ran Out of Food in the Last Year: Never true  Transportation Needs: No Transportation Needs (01/28/2024)   PRAPARE - Administrator, Civil Service (Medical): No    Lack of Transportation (Non-Medical): No  Physical Activity: Inactive (01/28/2024)   Exercise Vital Sign    Days of Exercise per Week: 0 days    Minutes of Exercise per Session: 0 min  Stress: No Stress Concern Present (01/28/2024)   [employer] of Occupational Health - Occupational Stress Questionnaire    Feeling of Stress : Not at all  Social Connections: Moderately Isolated (01/28/2024)   Social Connection and Isolation Panel    Frequency of Communication with Friends and Family: More than three  times a week    Frequency of Social Gatherings with Friends and Family: Not on file    Attends Religious Services: Never    Active Member of Clubs or Organizations: No    Attends Banker Meetings: Never    Marital Status: Married  Catering Manager Violence: Not At Risk (01/28/2024)   Humiliation, Afraid, Rape, and Kick questionnaire    Fear of Current or Ex-Partner: No    Emotionally Abused: No    Physically Abused: No    Sexually Abused: No    Lab Results  Component Value Date   HGBA1C 6.6 (A) 09/02/2024   Lab Results  Component Value Date   CHOL 240 (H) 05/22/2024   Lab Results  Component Value Date   HDL 38.20 (L) 05/22/2024   Lab Results  Component Value Date   LDLCALC 165 (H) 05/22/2024   Lab Results  Component Value Date   TRIG 182.0 (H) 05/22/2024   Lab Results  Component Value Date   CHOLHDL 6 05/22/2024   Lab Results  Component Value Date   CREATININE 1.64 (H) 05/22/2024   Lab Results  Component Value Date   MICROALBUR 3.4 03/03/2024    Parts of this note may have been dictated using voice recognition software. There may be variances in spelling and vocabulary which are unintentional. Not all errors are proofread. Please notify the dino if any discrepancies are noted or if the meaning of any statement is not clear.

## 2024-09-02 NOTE — Patient Instructions (Signed)

## 2024-09-04 ENCOUNTER — Encounter: Payer: Self-pay | Admitting: "Endocrinology

## 2024-09-04 NOTE — Progress Notes (Deleted)
 Subjective:    Patient ID: [name], male    DOB: 1940/12/31, 83 y.o.   MRN: [id number]   HPI  1) Left sided sciatica: -he is trying to decrease his medication -did not topamax  because of potential side effect -is not taken gabapentin  but would be interested in this -the pain radiates down his left leg to past his left knee -pain ranges from 3-7 -has a back brace but does not like to use it -lidocaine  patch 5% helps a little more -pain is ok -he seldom uses the patch because he is usually sitting at home -he starts to feel pain about 2 seconds after standing -does not want to try more gabapentin  or topamax   2) Knee buckling -hurt severely, left is worse than the right -happens once in a while -had surgery for torn meniscus -his surgeon said his meniscus ripped in two -he walked out from the surgery  3) HTN: -elevated to 158/83 -does not check BP at home  4) 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   On first visit, pt complained of b/l L>R back pain.  Started ~>20 years ago.  No inciting event.  Sitting/laying improve the pain.  Standing exacerbates the pain.  Sharp, burning pain.  Radiates to left posterior thigh.  Intermittent.  He has associated numbness/tingling.  He only tried ASA, which helps some.  Pain limits from doing ADLs and fishing.  He denies falls. Back pain has been ok especially if sitting down Can't stand more than 5 or 10 minutes Tried PT and cannot feel difference -pain is worst on left.  Last Qutenza  patch helped a lot for his back but he was charged $700, [facility] sent the patches to speciality pharmacy this time and they were 100% covered Qutenza  patches helped on feet where he has tingling. He has less pain in his feet except for his toes which he thinks is due to gout [name] since he was ere  last, landed on his knees He is doing well decreasing snacking after dinner 169 CBG right now.  -he usually snacks at night on potato chips after dinner -does not eat much more baked potatoes Voltaren  cream helps the back pain but does not stop it When he fishes he feels pain from the jolting- fishing made it worse -He takes advil  and this helps a little.  Sometimes it moves into his legs.   Last clinic visit on 10/21/2020.  He had trigger point injections at that time.  Since that time, patient states he had great benefit for about a week.  He uses a TENS with benefit. Patient states he feels he is getting weaker. He is taking Baclofen  1-2/week.  Sleep has improved. He is taking Elavil  25.  He states he believe he lost weight, but not exercising. Denies falls. Lightheadedness has improved.    He does not take any other medications for his pain and does not desire to. He requires a cane for ambulation and has a handicap placard.   Blood pressure is better controlled today at 120/74  He has been to multiple speciialists and was told he will not be able to see from this eye after her had a fall on this eye Has not been using SPRINT PNS but it did help when he used it.  Has been having  headaches.  -he is ready to try Qutenza  today -he has numbness and tingling from his shins down to his feet and it is constant and feels uncomfortable -his back pain has also been severe -wife accompanies him today -he has decreased sensation in both feet   Pain Inventory Average Pain 5 Pain Right Now 6 My pain is intermittent, sharp, burning, and tingling  In the last 24 hours, has pain interfered with the following? General activity 0 Relation with others 10 Enjoyment of life 10 What TIME of day is your pain at its worst? daytime Sleep (in general) Good  Pain is worse with: walking and standing Pain improves with: rest Relief from Meds: 5     Family History  Problem Relation Age of Onset    Colon cancer Brother 93   Colon polyps Brother    Lung cancer Brother    Other Mother        MVA, deceased 44s   Healthy Daughter    Healthy Son    Rectal cancer Neg Hx    Stomach cancer Neg Hx    Social History   Socioeconomic History   Marital status: Married    Spouse name: Not on file   Number of children: Not on file   Years of education: Not on file   Highest education level: Not on file  Occupational History    Employer: [employer]  Tobacco Use   Smoking status: Former    Current packs/day: 0.00    Average packs/day: 2.0 packs/day for 31.0 years (62.0 ttl pk-yrs)    Types: Cigarettes    Start date: 37    Quit date: 10/23/1982    Years since quitting: 41.8   Smokeless tobacco: Never  Vaping Use   Vaping status: Never Used  Substance and Sexual Activity   Alcohol  use: No    Alcohol /week: 0.0 standard drinks of alcohol    Drug use: No   Sexual activity: Not on file  Other Topics Concern   Not on file  Social History Narrative   Lives with wife in a one story home.  Has a son and a daughter.     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     Education: 2 years of college.   Social Drivers of Corporate Investment Banker Strain: Low Risk  (01/28/2024)   Overall Financial Resource Strain (CARDIA)    Difficulty of Paying Living Expenses: Not hard at all  Food Insecurity: No Food Insecurity (01/28/2024)   Hunger Vital Sign    Worried About Running Out of Food in the Last Year: Never true    Ran Out of Food in the Last Year: Never true  Transportation Needs: No Transportation Needs (01/28/2024)   PRAPARE - Administrator, Civil Service (Medical): No    Lack of Transportation (Non-Medical): No  Physical Activity: Inactive (01/28/2024)   Exercise Vital Sign    Days of Exercise per Week: 0 days    Minutes of Exercise per Session: 0 min  Stress: No Stress Concern Present (01/28/2024)   [employer] of Occupational Health - Occupational  Stress Questionnaire    Feeling of Stress : Not at all  Social Connections: Moderately Isolated (01/28/2024)   Social Connection and Isolation Panel    Frequency of Communication with Friends and Family: More than three times a week    Frequency of Social Gatherings with Friends and Family: Not on file    Attends Religious Services:  Never    Active Member of Clubs or Organizations: No    Attends Banker Meetings: Never    Marital Status: Married   Past Surgical History:  Procedure Laterality Date   Abdominal US   09/1997   arm fracture Left 1958   with hardware   Colon cancer screening     COLONOSCOPY  08/18/2004   diverticulitis   COLONOSCOPY  09/24/2009   ELECTROCARDIOGRAM  02/2006   FLEXIBLE SIGMOIDOSCOPY  03/04/2001   polyps, anal fissure   GANGLION CYST EXCISION Left 1980   HIP PINNING,CANNULATED Right 04/25/2021   Procedure: CANNULATED HIP PINNING;  Surgeon: Fidel Rogue, MD;  Location: [facility];  Service: Orthopedics;  Laterality: Right;   Lower Arterial  04/13/2004   POLYPECTOMY     Rest Cardiolite  03/19/2003   Past Medical History:  Diagnosis Date   Allergy    Anal fissure    Anemia, unspecified    Arthritis    Spinal OA   BACK PAIN, LUMBAR 10/23/2007   CARDIAC MURMUR, SYSTOLIC 10/27/2008   DIABETES MELLITUS, TYPE I 05/20/2007   DIASTOLIC DYSFUNCTION 11/04/2008   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HCC)    Duodenitis    Edema 05/12/2008   Esophageal stricture    GERD (gastroesophageal reflux disease)    GOUT 05/20/2007   Gynecomastia    Hepatic steatosis    HYPERLIPIDEMIA 05/20/2007   Hypertension    Hypogonadism male    Morbid obesity (HCC) 09/10/2009   OBSTRUCTIVE SLEEP APNEA 11/04/2007   Personal history of colonic polyps    RENAL INSUFFICIENCY 05/12/2008   Sleep apnea    uses cpap   Squamous cell carcinoma of skin 02/16/2020   left lower leg, anterior-cx3,89fu   Urolithiasis    There were no vitals taken for this visit.  Opioid Risk  Score:   Fall Risk Score:  `1  Depression screen [facility] 2/9     06/03/2024    2:30 PM 02/26/2024    2:39 PM 01/28/2024   11:31 AM 10/29/2023    2:35 PM 06/26/2023    2:38 PM 05/23/2023    9:24 AM 03/21/2023    8:40 AM  Depression screen PHQ 2/9  Decreased Interest 0 0 0 0 0 0 0  Down, Depressed, Hopeless 0 0 0 0 0 0 0  PHQ - 2 Score 0 0 0 0 0 0 0  Altered sleeping   0      Tired, decreased energy   0      Change in appetite   0      Feeling bad or failure about yourself    0      Trouble concentrating   0      Moving slowly or fidgety/restless   0      Suicidal thoughts   0      PHQ-9 Score   0       Difficult doing work/chores   Not difficult at all         Data saved with a previous flowsheet row definition    Review of Systems  HENT: Negative.    Eyes: Negative.   Respiratory:  Positive for shortness of breath. Negative for apnea.   Cardiovascular: Negative.   Gastrointestinal: Negative.   Endocrine:       Diabetic High blood sugar  Genitourinary: Negative.           Musculoskeletal:  Positive for arthralgias, back pain, gait problem, myalgias and neck pain.  Skin: Negative.   Allergic/Immunologic: Negative.   Neurological:  Positive for weakness and numbness.       Tingling  Hematological: Negative.   All other systems reviewed and are negative.     Objective:   Physical Exam  Constitutional: No distress . Vital signs reviewed.  Gen: no distress, normal appearing HEENT: oral mucosa pink and moist, NCAT Cardio: Reg rate Chest: normal effort, normal rate of breathing Abd: soft, non-distended Ext: no edema Psych: pleasant, normal affect Skin: intact Musc: TTP lumbar spinous process Musc: Gait: Mildly antalgic. Ambulating with cane at all times.  Neuro: Alert  HOH             Strength          5/5 in all LE myotomes, stable 06/03/24 Decreased sensation in bilateral feet Pain is worst with walking    Assessment & Plan:  Male with pmh of OSA, CKD, HTN, DM type 1  with peripheral neuropathy presents for follow up of low back pain > neck pain.     1. Chronic mechanical low back pain             MRIs C,L-spine early 2016 suggesting multilevel spondylosis with mild b/l multilevel foraminal narrowing C4-C7 and shallow disc bulge at L5-S1 without central canal or foraminal stenosis.   Continue lidocaine  patch  Discussed physical therapy but he prefers not to pursue  Discussed prednisone   D/c gabapentin   Discussed that pain informs regarding what movements to avoid  -discussed medial branch blocks  Discussed core strengthening exercises             Avoid NSAIDs due to CKD, discussed with patient since he takes Advil   Continue voltaren  gel, advised that he could use up to 4 times per day.              Unable to tolerate Gabapentin , Lyrica              Cont HEP  Continue topamax   Discussed steroid injections but they do not seem to help  Discussed repeat MIR of lumbar spine  Continue TENS             Discussed he does not want to do therapy             Cont tylenol   Robaxin  500 TID PRN, changed to Baclofen  10 TID PRN due to change insurance, continue   Discussed that stopped cymbalta              Cont back brace during periods of excessive activity, reminded to avoid prolonged use  Continue Lidoderm  OTC, reminded              Encouraged rest breaks             Acknowledges he can do more if he make a conscious effort             PT completed    Pain over spinous process where clothing is resulting increased pressure.  Encourage change in clothing.   Sprint PNS removed today.   -Provided with a pain relief journal and discussed that it contains foods and lifestyle tips to naturally help to improve pain. Discussed that these lifestyle strategies are also very good for health unlike some medications which can have negative side effects. Discussed that the act of keeping a journal can be therapeutic and helpful to realize patterns what helps to trigger and  alleviate pain.  -Discussed Qutenza  as an option for neuropathic pain  control. Discussed that this is a capsaicin  patch, stronger than capsaicin  cream. Discussed that it is currently approved for diabetic peripheral neuropathy and post-herpetic neuralgia, but that it has also shown benefit in treating other forms of neuropathy. Provided patient with link to site to learn more about the patch: [email or web address].qutenza .com/. Discussed that the patch would be placed in office and benefits usually last 3 months. Discussed that unintended exposure to capsaicin  can cause severe irritation of eyes, mucous membranes, respiratory tract, and skin, but that Qutenza  is a local treatment and does not have the systemic side effects of other nerve medications. Discussed that there may be pain, itching, erythema, and decreased sensory function associated with the application of Qutenza . Side effects usually subside within 1 week. A cold pack of analgesic medications can help with these side effects. Blood pressure can also be increased due to pain associated with administration of the patch.   2. Sacroiliitis             See #1             Continue brace             Not main pain generator at present, does not wish to proceed with injection at this time, particularly due to DM  Turmeric to reduce inflammation--can be used in cooking or taken as a supplement.  Benefits of turmeric:  -Highly anti-inflammatory  -Increases antioxidants  -Improves memory, attention, brain disease  -Lowers risk of heart disease  -May help prevent cancer  -Decreases pain  -Alleviates depression  -Delays aging and decreases risk of chronic disease  -Consume with black pepper to increase absorption    Turmeric Milk Recipe:  1 cup milk  1 tsp turmeric  1 tsp cinnamon  1 tsp grated ginger (optional)  Black pepper (boosts the anti-inflammatory properties of turmeric).  1 tsp honey    3. Sleep disturbance              Recently new mattress  -discussed that his phone says he sleeps well  Continue gabapentin  300mg  TID PRN             Continue CPAP             Improved -Try to go outside near sunrise -Get exercise during the day.  -Turn off all devices an hour before bedtime.  -Teas that can benefit: chamomile, valerian root, Brahmi (Bacopa) -Can consider over the counter melatonin, magnesium , and/or L-theanine. Melatonin is an anti-oxidant with multiple health benefits. Magnesium  is involved in greater than 300 enzymatic reactions in the body and most of us  are deficient as our soil is often depleted. There are 7 different types of magnesium - Bioptemizer's is a supplement with all 7 types, and each has unique benefits. Magnesium  can also help with constipation and anxiety.  -Pistachios naturally increase the production of melatonin -Cozy Earth bamboo bed sheets are free from toxic chemicals.  -Tart cherry juice or a tart cherry supplement can improve sleep and soreness post-workout   4. Myalgia  S/p trigger point injections without enough relief   5. Diabetic neuropathy  -discussed that diabetes has been well controlled             See #1, #3            Continue elavil  to 50mg  qhs, continue 25mg  educated on signs/symptoms of seratonin syndrome previously  Provided dietary education  Refilled Cymbalta .   Continue amitriptyline  10mg  HS  -Discussed Qutenza  as an option  for neuropathic pain control. Discussed that this is a capsaicin  patch, stronger than capsaicin  cream. Discussed that it is currently approved for diabetic peripheral neuropathy and post-herpetic neuralgia, but that it has also shown benefit in treating other forms of neuropathy. Provided patient with link to site to learn more about the patch: [email or web address].qutenza .com/. Discussed that the patch would be placed in office and benefits usually last 3 months. Discussed that unintended exposure to capsaicin  can cause severe irritation of eyes, mucous  membranes, respiratory tract, and skin, but that Qutenza  is a local treatment and does not have the systemic side effects of other nerve medications. Discussed that there may be pain, itching, erythema, and decreased sensory function associated with the application of Qutenza . Side effects usually subside within 1 week. A cold pack of analgesic medications can help with these side effects. Blood pressure can also be increased due to pain associated with administration of the patch.   Recommended no snacking after dinner -Discussed current symptoms of pain and history of pain.  -Discussed benefits of exercise in reducing pain. -Discussed following foods that may reduce pain: 1) Ginger (especially studied for arthritis)- reduce leukotriene production to decrease inflammation 2) Blueberries- high in phytonutrients that decrease inflammation 3) Salmon- marine omega-3s reduce joint swelling and pain 4) Pumpkin seeds- reduce inflammation 5) dark chocolate- reduces inflammation 6) turmeric- reduces inflammation 7) tart cherries - reduce pain and stiffness 8) extra virgin olive oil - its compound olecanthal helps to block prostaglandins  9) chili peppers- can be eaten or applied topically via capsaicin  10) mint- helpful for headache, muscle aches, joint pain, and itching 11) garlic- reduces inflammation  Link to further information on diet for chronic pain: http://www.bray.com/   --Discussed Qutenza  as an option for neuropathic pain control. Discussed that this is a capsaicin  patch, stronger than capsaicin  cream. Discussed that it is currently approved for diabetic peripheral neuropathy and post-herpetic neuralgia, but that it has also shown benefit in treating other forms of neuropathy. Provided patient with link to site to learn more about the patch: [email or web address].qutenza .com/. Discussed that the patch would be placed in office and  benefits usually last 3 months. Discussed that unintended exposure to capsaicin  can cause severe irritation of eyes, mucous membranes, respiratory tract, and skin, but that Qutenza  is a local treatment and does not have the systemic side effects of other nerve medications. Discussed that there may be pain, itching, erythema, and decreased sensory function associated with the application of Qutenza . Side effects usually subside within 1 week. A cold pack of analgesic medications can help with these side effects. Blood pressure can also be increased due to pain associated with administration of the patch.   -discussed lidocaine  patches can be applied on the feet as well  -discussed that he is no longer having pain, just the tingling   6. Morbid obesity             Not interested in seeing dietitian at this time  Encouraged activity   No attempts at weight loss  Discussed Topamax .   Commended on improvement!   7. Gait abnormality             Completed therapies, cont HEP  D/c cane  Decrease amitriptyline  to 10mg    8. Skin cancer of left lateral foot: discussed that this was skin cancer and it was removed  9. Lightheadedness  Encouraged BP monitoring at home  Decrease amitriptyline  to 10mg  HS  10. Knee pain: -continue applying voltaren  gel -discussed that ne  needed meniscal surgery  11. Chronic gout:  -discussed that he stopped the allopurinol  but has not had recurrent gout flares -recommended to avoid fructose -Discussed current symptoms of pain and history of pain.  -Discussed benefits of exercise in reducing pain. -Discussed following foods that may reduce pain: 1) Ginger (especially studied for arthritis)- reduce leukotriene production to decrease inflammation 2) Blueberries- high in phytonutrients that decrease inflammation 3) Salmon- marine omega-3s reduce joint swelling and pain 4) Pumpkin seeds- reduce inflammation 5) dark chocolate- reduces inflammation 6) turmeric-  reduces inflammation 7) tart cherries - reduce pain and stiffness 8) extra virgin olive oil - its compound olecanthal helps to block prostaglandins  9) chili peppers- can be eaten or applied topically via capsaicin  10) mint- helpful for headache, muscle aches, joint pain, and itching 11) garlic- reduces inflammation 12) Green tea- reduces inflammation and oxidative stress, helps with weight loss, may reduce the risk of cancer, recommend Double Green Matcha [place] of Tea daily  Link to further information on diet for chronic pain: [email or web address].bray.com/   12. HTN: -BP is 107/65 -continue lasix  -Advised checking BP daily at home and logging results to bring into follow-up appointment with PCP and myself. -Reviewed BP meds today.  -Advised regarding healthy foods that can help lower blood pressure and provided with a list: 1) citrus foods- high in vitamins and minerals 2) salmon and other fatty fish - reduces inflammation and oxylipins 3) swiss chard (leafy green)- high level of nitrates 4) pumpkin seeds- one of the best natural sources of magnesium  5) Beans and lentils- high in fiber, magnesium , and potassium 6) Berries- high in flavonoids 7) Amaranth (whole grain, can be cooked similarly to rice and oats)- high in magnesium  and fiber 8) Pistachios- even more effective at reducing BP than other nuts 9) Carrots- high in phenolic compounds that relax blood vessels and reduce inflammation 10) Celery- contain phthalides that relax tissues of arterial walls 11) Tomatoes- can also improve cholesterol and reduce risk of heart disease 12) Broccoli- good source of magnesium , calcium , and potassium 13) Greek yogurt: high in potassium and calcium  14) Herbs and spices: Celery seed, cilantro, saffron, lemongrass, black cumin, ginseng, cinnamon, cardamom, sweet basil, and ginger 15) Chia and flax seeds- also help to lower  cholesterol and blood sugar 16) Beets- high levels of nitrates that relax blood vessels  17) spinach and bananas- high in potassium  -Provided lise of supplements that can help with hypertension:  1) magnesium : one high quality brand is Bioptemizers since it contains all 7 types of magnesium , otherwise over the counter magnesium  gluconate 400mg  is a good option 2) B vitamins 3) vitamin D  4) potassium 5) CoQ10 6) L-arginine 7) Vitamin C 8) Beetroot -Educated that goal BP is 120/80. -Made goal to incorporate some of the above foods into diet.    13) Grief -discussed that he has grief regarding the death of his daughter

## 2024-09-08 ENCOUNTER — Encounter: Attending: Physical Medicine and Rehabilitation | Admitting: Physical Medicine and Rehabilitation

## 2024-09-08 DIAGNOSIS — M1A9XX0 Chronic gout, unspecified, without tophus (tophi): Secondary | ICD-10-CM | POA: Insufficient documentation

## 2024-09-08 DIAGNOSIS — G8929 Other chronic pain: Secondary | ICD-10-CM | POA: Insufficient documentation

## 2024-09-08 DIAGNOSIS — E1142 Type 2 diabetes mellitus with diabetic polyneuropathy: Secondary | ICD-10-CM | POA: Insufficient documentation

## 2024-09-08 DIAGNOSIS — M5432 Sciatica, left side: Secondary | ICD-10-CM | POA: Insufficient documentation

## 2024-09-08 DIAGNOSIS — M25561 Pain in right knee: Secondary | ICD-10-CM | POA: Insufficient documentation

## 2024-09-30 ENCOUNTER — Ambulatory Visit: Admitting: "Endocrinology

## 2024-09-30 ENCOUNTER — Other Ambulatory Visit: Payer: Self-pay | Admitting: Family Medicine

## 2024-10-07 ENCOUNTER — Encounter: Payer: Self-pay | Admitting: "Endocrinology

## 2024-10-07 ENCOUNTER — Ambulatory Visit: Admitting: "Endocrinology

## 2024-10-07 VITALS — BP 120/80 | HR 87 | Ht 71.0 in | Wt 263.0 lb

## 2024-10-07 DIAGNOSIS — Z794 Long term (current) use of insulin: Secondary | ICD-10-CM

## 2024-10-07 DIAGNOSIS — E782 Mixed hyperlipidemia: Secondary | ICD-10-CM

## 2024-10-07 DIAGNOSIS — Z9641 Presence of insulin pump (external) (internal): Secondary | ICD-10-CM | POA: Diagnosis not present

## 2024-10-07 DIAGNOSIS — E1140 Type 2 diabetes mellitus with diabetic neuropathy, unspecified: Secondary | ICD-10-CM

## 2024-10-07 MED ORDER — EZETIMIBE 10 MG PO TABS: 10.0000 mg | ORAL_TABLET | Freq: Every day | ORAL | 3 refills | Status: AC

## 2024-10-07 NOTE — Patient Instructions (Signed)

## 2024-10-07 NOTE — Progress Notes (Signed)
 [facility] Note  [name] 05-Mar-1941 [id number]  Referring Provider: @REFPROVFULL @ Primary Care Provider: Berneta Elsie Sayre, MD Reason for consultation: No chief complaint on file.   Assessment & Plan Diagnoses and all orders for this visit:  Type 2 diabetes mellitus with diabetic neuropathy, with long-term current use of insulin  (HCC)  Insulin  pump in place  Mixed hypercholesterolemia and hypertriglyceridemia   Type 2 diabetes mellitus Hba1c goal less than 7.0, current Hba1c is Lab Results  Component Value Date   HGBA1C 6.6 (A) 09/02/2024   HGBA1C 6.7 (A) 06/04/2024   HGBA1C 6.7 (A) 03/03/2024   Will recommend: Insulin  Pump Settings:  Continue settings as follows: 09/02/24: Start ozempic  0.25mg /week-couldn't; tolerate due to GERD/being on fire HUMALOG  Insulin  with pump: T-slim control IQ, DexCom G6  BASAL settings: 12-8 am = 1.65, 8-12 noon= 1.8, Noon to 12 MN = 1.75 Carb 1:3 (1:2.5 from noon-5pm and 1.4 from 5pm-MN)  correction factor 1: 25>100 Active insulin  3 hours (on Control IQ) Reinforced bolusing 5 min before meals-made alarms for remembering   No history of MEN syndrome/medullary thyroid  cancer/pancreatitis or pancreatic cancer in self or family Took ozempic  in past without issues   Bolus 5 min before each meal  03/03/24: advised against lows. Discussed and prescribed baqsimi    -check blood sugars before each meals and at bedtime -enter blood sugars in the pump and calibrate three times a day. Calibrate when fasting and at bedtime and one calibration before the meal -avoid calibrations when the blood sugar is rising rapidly or falling -pre bolus carbs when eating. Pre bolus needs to be 5 minutes before the meal -do not enter false or fake carbohydrates -do not enter carbohydratess after you eat and try to catch up with a high blood sugar -change sets on time to have consistent insulin  absorption and prevent scar formation -call  the office /answering service for blood sugar questions regarding hyper or hypoglycemia or sensor and transmitter issues -insulin  settings updated and scanned in chart  No known contraindications to any of above medications  Hyperlipidemia -Last LDL off goal: 165 -on rosuvastatin  40 mg every day, fish oil (10/07/24: added ezetimibe  10 mg every day for elevated LDL; pt will discuss LDL with his cardiologist) -stopped repatha  140 mg q14 days due to UTIs -Follow low fat diet and exercise, pt likes fried foods  -ordered repeat lab   -Blood pressure goal <140/90 - Microalbumin/creatinine goal < 30 -on losartan 25 mg every day -diet changes including salt restriction -limit eating outside -counseled BP targets per standards of diabetes care -Uncontrolled blood pressure can lead to retinopathy, nephropathy and cardiovascular and atherosclerotic heart disease  Reviewed and counseled on: -A1C target -Blood sugar targets -Complications of uncontrolled diabetes  -Checking blood sugar before meals and bedtime and bring log next visit -All medications with mechanism of action and side effects -Hypoglycemia management: rule of 15's, Glucagon  Emergency Kit and medical alert ID -low-carb low-fat plate-method diet -At least 20 minutes of physical activity per day -Annual dilated retinal eye exam and foot exam -compliance and follow up needs -follow up as scheduled or earlier if problem gets worse  Call if blood sugar is less than 70 or consistently above 250    Take a 15 gm snack of carbohydrate at bedtime before you go to sleep if your blood sugar is less than 100.    If you are going to fast after midnight for a test or procedure, ask your physician for instructions on  how to reduce/decrease your insulin  dose.    Call if blood sugar is less than 70 or consistently above 250  -Treating a low sugar by rule of 15  (15 gms of sugar every 15 min until sugar is more than 70) If you feel your  sugar is low, test your sugar to be sure If your sugar is low (less than 70), then take 15 grams of a fast acting Carbohydrate (3-4 glucose tablets or glucose gel or 4 ounces of juice or regular soda) Recheck your sugar 15 min after treating low to make sure it is more than 70 If sugar is still less than 70, treat again with 15 grams of carbohydrate          Don't drive the hour of hypoglycemia  If unconscious/unable to eat or drink by mouth, use glucagon  injection or nasal spray baqsimi  and call 911. Can repeat again in 15 min if still unconscious.   I have reviewed current medications, nurse's notes, allergies, vital signs, past medical and surgical history, family medical history, and social history for this encounter. Counseled patient on symptoms, examination findings, lab findings, imaging results, treatment decisions and monitoring and prognosis. The patient understood the recommendations and agrees with the treatment plan. All questions regarding treatment plan were fully answered.  Return in about 3 months (around 01/05/2025).   Obadiah Birmingham, MD 10/07/2024   History of Present Illness [name] is a 83 y.o. year old male who presents to our clinic for a follow up for Type 2 diabetes mellitus. Vontrell Pullman was first diagnosed in 1986.  Previous history: Insulin  was started in 1995  Recent history:     Non-insulin  hypoglycemic drugs: None     Insulin  pump: T-slim control IQ       BASAL settings: 12-8 am = 1.65, 8-12 noon= 1.8, Noon to 12 MN = 1.75 Carb 1:3 (1:2.5 from noon-5pm and 1.4 from 5pm-MN)  correction factor 1: 25>100 Active insulin  3 hours (on Control IQ)  Ambulatory BG Monitoring CGM interpretation: At today's visit, we reviewed her CGM downloads. The full report is scanned in the media. Reviewing the CGM trends, BG are well controlled across the day; with some rare highs with meals.  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 Blood sugars reviewed Basal rates reviewed Auto mode % reviewed Manual mode % reviewed Sensor wear % reviewed Readings above target % reviewed Readings at target % reviewed Readings below target % reviewed TDD units of insulin  /day reviewed Changing sets on time reviewed Carb counting reviewed Basal bolus ratio reviewed Basal % reviewed Bolus % reviewed Duration of pump suspends reviewed No reaction at insulin  pump sites Set change=every 3 days Reservoir change = every 3 days  Physical Exam  BP 120/80   Pulse 87   Ht 5' 11 (1.803 m)   Wt 263 lb (119.3 kg)   SpO2 97%   BMI 36.68 kg/m    Constitutional: well developed, well nourished Head: normocephalic, atraumatic Eyes: sclera anicteric, no redness Neck: supple Lungs: normal respiratory effort Neurology: alert and oriented Skin: dry, no appreciable rashes Musculoskeletal: no appreciable defects Psychiatric: normal mood and affect  Current Medications Patient's Medications  New Prescriptions   No medications on file  Previous Medications   ACCU-CHEK FASTCLIX LANCETS MISC    USE TO CHECK BLOOD SUGAR UP TO 6 TIMES DAILY   ALFUZOSIN  (UROXATRAL ) 10 MG 24 HR TABLET    TAKE  1 TABLET BY MOUTH  DAILY WITH BREAKFAST   AMITRIPTYLINE  (ELAVIL ) 10 MG TABLET    TAKE 1 TABLET(10 MG) BY MOUTH AT BEDTIME   BLOOD GLUCOSE MONITORING SUPPL (ACCU-CHEK AVIVA PLUS) W/DEVICE KIT    Use as directed   BLOOD GLUCOSE MONITORING SUPPL DEVI    1 each by Does not apply route in the morning, at noon, and at bedtime. May substitute to any manufacturer covered by patient's insurance.   CETIRIZINE  (ZYRTEC ) 10 MG TABLET    Take 10 mg by mouth daily as needed for allergies.   CONTINUOUS BLOOD GLUC SENSOR (DEXCOM G6 SENSOR) MISC    Inject 1 Device into the skin as directed. Apply to skin SQ every 10 days   DICLOFENAC  SODIUM (VOLTAREN ) 1 % GEL    Apply 1 Application topically.   DOXYCYCLINE HYCLATE 200 MG TBEC    Take by mouth in  the morning and at bedtime.   FINASTERIDE (PROSCAR) 5 MG TABLET    Take 5 mg by mouth daily.   FLUTICASONE  (FLONASE) 50 MCG/ACT NASAL SPRAY    Place 1 spray into the nose daily as needed for allergies.    FOLIC ACID  (FOLVITE ) 1 MG TABLET    Take 1 mg by mouth daily.   FUROSEMIDE  (LASIX ) 20 MG TABLET    TAKE 1 TABLET BY MOUTH  DAILY   GLUCAGON  (BAQSIMI  ONE PACK) 3 MG/DOSE POWD    Place 1 Device into the nose as needed (Low blood sugar with impaired consciousness).   GLUCOSE BLOOD (ACCU-CHEK AVIVA PLUS) TEST STRIP    USE TO TEST BLOOD SUGAR 6  TIMES PER DAY; E11.9   GLUCOSE BLOOD (BLOOD GLUCOSE TEST STRIPS) STRP    1 each by In Vitro route in the morning, at noon, and at bedtime. May substitute to any manufacturer covered by patient's insurance.   INSULIN  INFUSION PUMP SUPPLIES (AUTOSOFT XC INFUSION SET) MISC    Use with 9mm cannula and 23 inch tubing. *5 boxes (90 day supply)  Change every 3 days   INSULIN  INFUSION PUMP SUPPLIES (T:SLIM INSULIN  CARTRIDGE ) MISC    Use with insulin  pump, fill once every 2 days.   INSULIN  LISPRO (HUMALOG ) 100 UNIT/ML INJECTION    INJECT SUBCUTANEOUSLY VIA  INSULIN  PUMP TOTAL OF 120 UNITS  DAILY   LIDOCAINE  (LIDODERM ) 5 %    Place 1 patch onto the skin daily. Remove & Discard patch within 12 hours or as directed by MD   MULTIPLE VITAMINS-MINERALS (CENTRUM SILVER PO)    Take 1 tablet by mouth daily.   OMEGA-3 FATTY ACIDS (FISH OIL) 1200 MG CAPS    Take 1,200 mg by mouth daily.   PHENAZOPYRIDINE (PYRIDIUM) 200 MG TABLET    Take 200 mg by mouth 3 (three) times daily as needed.   ROSUVASTATIN  (CRESTOR ) 40 MG TABLET    TAKE 1 TABLET BY MOUTH AT  BEDTIME   SEMAGLUTIDE ,0.25 OR 0.5MG /DOS, 2 MG/3ML SOPN    Inject 0.25 mg into the skin once a week. 0.25mg  weekly for 4 weeks followed by 0.5mg  for 4 weeks   VITAMIN B-12 (CYANOCOBALAMIN ) 1000 MCG TABLET    Take 1,000 mcg by mouth daily.  Modified Medications   No medications on file  Discontinued Medications   No medications  on file    Allergies Allergies  Allergen Reactions   Atorvastatin Calcium     Lipitor [Atorvastatin] Other (See Comments)    unknown   Niacin     REACTION: Severe heartburn  Pioglitazone     REACTION: Edema    Past Medical History Past Medical History:  Diagnosis Date   Allergy    Anal fissure    Anemia, unspecified    Arthritis    Spinal OA   BACK PAIN, LUMBAR 10/23/2007   CARDIAC MURMUR, SYSTOLIC 10/27/2008   DIABETES MELLITUS, TYPE I 05/20/2007   DIASTOLIC DYSFUNCTION 11/04/2008   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HCC)    Duodenitis    Edema 05/12/2008   Esophageal stricture    GERD (gastroesophageal reflux disease)    GOUT 05/20/2007   Gynecomastia    Hepatic steatosis    HYPERLIPIDEMIA 05/20/2007   Hypertension    Hypogonadism male    Morbid obesity (HCC) 09/10/2009   OBSTRUCTIVE SLEEP APNEA 11/04/2007   Personal history of colonic polyps    RENAL INSUFFICIENCY 05/12/2008   Sleep apnea    uses cpap   Squamous cell carcinoma of skin 02/16/2020   left lower leg, anterior-cx3,48fu   Urolithiasis     Past Surgical History Past Surgical History:  Procedure Laterality Date   Abdominal US   09/1997   arm fracture Left 1958   with hardware   Colon cancer screening     COLONOSCOPY  08/18/2004   diverticulitis   COLONOSCOPY  09/24/2009   ELECTROCARDIOGRAM  02/2006   FLEXIBLE SIGMOIDOSCOPY  03/04/2001   polyps, anal fissure   GANGLION CYST EXCISION Left 1980   HIP PINNING,CANNULATED Right 04/25/2021   Procedure: CANNULATED HIP PINNING;  Surgeon: Fidel Rogue, MD;  Location: [facility];  Service: Orthopedics;  Laterality: Right;   Lower Arterial  04/13/2004   POLYPECTOMY     Rest Cardiolite  03/19/2003    Family History family history includes Colon cancer (age of onset: 43) in his brother; Colon polyps in his brother; Healthy in his daughter and son; Lung cancer in his brother; Other in his mother.  Social History Social History   Socioeconomic History    Marital status: Married    Spouse name: Not on file   Number of children: Not on file   Years of education: Not on file   Highest education level: Not on file  Occupational History    Employer: [employer]  Tobacco Use   Smoking status: Former    Current packs/day: 0.00    Average packs/day: 2.0 packs/day for 31.0 years (62.0 ttl pk-yrs)    Types: Cigarettes    Start date: 28    Quit date: 10/23/1982    Years since quitting: 41.9   Smokeless tobacco: Never  Vaping Use   Vaping status: Never Used  Substance and Sexual Activity   Alcohol  use: No    Alcohol /week: 0.0 standard drinks of alcohol    Drug use: No   Sexual activity: Not on file  Other Topics Concern   Not on file  Social History Narrative   Lives with wife in a one story home.  Has a son and a daughter.     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     Education: 2 years of college.   Social Drivers of Health   Tobacco Use: Medium Risk (10/07/2024)   Patient History    Smoking Tobacco Use: Former    Smokeless Tobacco Use: Never    Passive Exposure: Not on file  Financial Resource Strain: Low Risk (01/28/2024)   Overall Financial Resource Strain (CARDIA)    Difficulty of Paying Living Expenses: Not hard at all  Food Insecurity: No  Food Insecurity (01/28/2024)   Hunger Vital Sign    Worried About Running Out of Food in the Last Year: Never true    Ran Out of Food in the Last Year: Never true  Transportation Needs: No Transportation Needs (01/28/2024)   PRAPARE - Administrator, Civil Service (Medical): No    Lack of Transportation (Non-Medical): No  Physical Activity: Inactive (01/28/2024)   Exercise Vital Sign    Days of Exercise per Week: 0 days    Minutes of Exercise per Session: 0 min  Stress: No Stress Concern Present (01/28/2024)   [employer] of Occupational Health - Occupational Stress Questionnaire    Feeling of Stress : Not at all  Social Connections: Moderately  Isolated (01/28/2024)   Social Connection and Isolation Panel    Frequency of Communication with Friends and Family: More than three times a week    Frequency of Social Gatherings with Friends and Family: Not on file    Attends Religious Services: Never    Active Member of Clubs or Organizations: No    Attends Banker Meetings: Never    Marital Status: Married  Catering Manager Violence: Not At Risk (01/28/2024)   Humiliation, Afraid, Rape, and Kick questionnaire    Fear of Current or Ex-Partner: No    Emotionally Abused: No    Physically Abused: No    Sexually Abused: No  Depression (PHQ2-9): Low Risk (06/03/2024)   Depression (PHQ2-9)    PHQ-2 Score: 0  Alcohol  Screen: Low Risk (01/28/2024)   Alcohol  Screen    Last Alcohol  Screening Score (AUDIT): 0  Housing: Unknown (01/28/2024)   Housing Stability Vital Sign    Unable to Pay for Housing in the Last Year: No    Number of Times Moved in the Last Year: Not on file    Homeless in the Last Year: No  Utilities: Not At Risk (01/28/2024)   AHC Utilities    Threatened with loss of utilities: No  Health Literacy: Adequate Health Literacy (01/28/2024)   B1300 Health Literacy    Frequency of need for help with medical instructions: Never    Lab Results  Component Value Date   HGBA1C 6.6 (A) 09/02/2024   Lab Results  Component Value Date   CHOL 240 (H) 05/22/2024   Lab Results  Component Value Date   HDL 38.20 (L) 05/22/2024   Lab Results  Component Value Date   LDLCALC 165 (H) 05/22/2024   Lab Results  Component Value Date   TRIG 182.0 (H) 05/22/2024   Lab Results  Component Value Date   CHOLHDL 6 05/22/2024   Lab Results  Component Value Date   CREATININE 1.64 (H) 05/22/2024   Lab Results  Component Value Date   MICROALBUR 3.4 03/03/2024    Parts of this note may have been dictated using voice recognition software. There may be variances in spelling and vocabulary which are unintentional. Not all errors are  proofread. Please notify the dino if any discrepancies are noted or if the meaning of any statement is not clear.

## 2024-10-09 ENCOUNTER — Encounter: Payer: Self-pay | Admitting: "Endocrinology

## 2024-10-31 ENCOUNTER — Telehealth: Payer: Self-pay | Admitting: Dietician

## 2024-10-31 NOTE — Telephone Encounter (Signed)
 Received patient's message that he needs training on the G7.  Chart reviewed.  Patient called.  He states that he is out of cartridges for the t:slim and has been unable to obtain them from the supplier.  Gave him [employer]'s number for supplies and he will inquire with them.  He states that his pump is old and he would like a new one.  Discussed that if it is out of [place], he can have it upgraded and the [employer] company can give him that information.  He also states that the G6 will end in July and needs to upgrade.  He sees Dr. Dartha in March.  Will wait until March or a pump upgrade.  Patient to call us  to let us  know of further questions or needs.  Leita Constable, RD, LDN, CDCES, DipACLM

## 2024-11-07 ENCOUNTER — Other Ambulatory Visit (HOSPITAL_COMMUNITY): Payer: Self-pay

## 2024-11-07 ENCOUNTER — Ambulatory Visit: Attending: Internal Medicine | Admitting: Internal Medicine

## 2024-11-07 ENCOUNTER — Telehealth: Payer: Self-pay | Admitting: Pharmacy Technician

## 2024-11-07 VITALS — BP 120/58 | HR 91 | Ht 72.0 in | Wt 262.8 lb

## 2024-11-07 DIAGNOSIS — E782 Mixed hyperlipidemia: Secondary | ICD-10-CM

## 2024-11-07 DIAGNOSIS — I700 Atherosclerosis of aorta: Secondary | ICD-10-CM

## 2024-11-07 DIAGNOSIS — I2510 Atherosclerotic heart disease of native coronary artery without angina pectoris: Secondary | ICD-10-CM | POA: Diagnosis not present

## 2024-11-07 DIAGNOSIS — G4733 Obstructive sleep apnea (adult) (pediatric): Secondary | ICD-10-CM | POA: Diagnosis not present

## 2024-11-07 NOTE — Telephone Encounter (Signed)
 SABRA

## 2024-11-07 NOTE — Progress Notes (Signed)
 " Cardiology Office Note:  .    Date:  11/07/2024  ID:  [name], DOB 07-12-1941, MRN [id number] PCP: Berneta Elsie Sayre, MD  [facility] Providers Cardiologist:  Stanly DELENA Leavens, MD     CC:  Follow up CAC  History of Present Illness: .    [name] is a 84 y.o. male with hypertension, type one diabetes, hyperlipidemia, morbid obesity, and CKD stage three A who presents for cardiology follow-up.  He has a history of first degree heart block and intermittent right bundle branch block. The patient reports no chest pain or shortness of breath.  He has a family history of abdominal aortic aneurysm in his son. An MRA in 2022 showed no evidence of an aortic aneurysm but revealed mild thoracic aortic dilation, last assessed in 2025 and measured under 40 mm.  He has a history of statin myalgia and is currently on rosuvastatin  40 mg. He previously used a PCSK9 inhibitor but discontinued it due to two urinary tract infections while on Repatha . He is now on Zetia  10 mg daily and Lasix  on Monday, Wednesday, and Friday. He stopped semaglutide  due to heartburn.  He experiences muscle aches and pains, particularly in his neck and lower back, regardless of medication use. He has not tolerated statins well in the past, experiencing muscle aches and pains.  Relevant histories: .  Social  2022: Had benign syncope work up. 2023: Received notes from Dr. Jerrye.  Creatinine now 1.84.  Now on 20 mg Twice per week. 2024: Son had AAA endovascular intervention 2025: BP elevated; improved by echocardiogram  ROS: As per HPI.   Studies Reviewed: .   Cardiac Studies & Procedures   ______________________________________________________________________________________________     ECHOCARDIOGRAM  ECHOCARDIOGRAM COMPLETE 12/20/2023  Narrative ECHOCARDIOGRAM REPORT    Patient Name:   SHAWNTEZ Key     Date of Exam: 12/20/2023 Medical Rec #:  [id number]     Height:       73.0 in Accession  #:    [id number]    Weight:       255.7 lb Date of Birth:  12/23/1940    BSA:          2.388 m Patient Age:    82 years      BP:           138/72 mmHg Patient Gender: M             HR:           81 bpm. Exam Location:  [address]  Procedure: 2D Echo, Cardiac Doppler and Color Doppler (Both Spectral and Color Flow Doppler were utilized during procedure).  Indications:    Aortic root dilation [facility]) [[id number]] Family history of abdominal aortic aneurysm [323960]  History:        Patient has prior history of Echocardiogram examinations, most recent 05/18/2021. Signs/Symptoms:Edema; Risk Factors:Dyslipidemia, Diabetes, Hypertension and Sleep Apnea. Family History of Abdominal Aortic Aneurysm.  Sonographer:    Lauraine Pilot RDCS Referring Phys: [id number] [facility] Pinki Rottman  IMPRESSIONS   1. Left ventricular ejection fraction, by estimation, is 60 to 65%. The left ventricle has normal function. The left ventricle has no regional wall motion abnormalities. There is mild left ventricular hypertrophy. Left ventricular diastolic parameters are consistent with Grade I diastolic dysfunction (impaired relaxation). 2. Right ventricular systolic function is normal. The right ventricular size is normal. There is normal pulmonary artery systolic pressure. 3. The mitral valve is normal in structure. No evidence  of mitral valve regurgitation. No evidence of mitral stenosis. 4. The aortic valve is tricuspid. Aortic valve regurgitation is trivial. No aortic stenosis is present. 5. The inferior vena cava is normal in size with greater than 50% respiratory variability, suggesting right atrial pressure of 3 mmHg.  FINDINGS Left Ventricle: Left ventricular ejection fraction, by estimation, is 60 to 65%. The left ventricle has normal function. The left ventricle has no regional wall motion abnormalities. Strain imaging was not performed. The left ventricular internal cavity size was normal in size. There is  mild left ventricular hypertrophy. Left ventricular diastolic parameters are consistent with Grade I diastolic dysfunction (impaired relaxation). Indeterminate filling pressures.  Right Ventricle: The right ventricular size is normal. No increase in right ventricular wall thickness. Right ventricular systolic function is normal. There is normal pulmonary artery systolic pressure. The tricuspid regurgitant velocity is 2.28 m/s, and with an assumed right atrial pressure of 3 mmHg, the estimated right ventricular systolic pressure is 23.8 mmHg.  Left Atrium: Left atrial size was normal in size.  Right Atrium: Right atrial size was normal in size.  Pericardium: There is no evidence of pericardial effusion.  Mitral Valve: The mitral valve is normal in structure. No evidence of mitral valve regurgitation. No evidence of mitral valve stenosis.  Tricuspid Valve: The tricuspid valve is normal in structure. Tricuspid valve regurgitation is trivial. No evidence of tricuspid stenosis.  Aortic Valve: The aortic valve is tricuspid. Aortic valve regurgitation is trivial. No aortic stenosis is present.  Pulmonic Valve: The pulmonic valve was normal in structure. Pulmonic valve regurgitation is mild. No evidence of pulmonic stenosis.  Aorta: The aortic root is normal in size and structure.  Venous: The inferior vena cava is normal in size with greater than 50% respiratory variability, suggesting right atrial pressure of 3 mmHg.  IAS/Shunts: No atrial level shunt detected by color flow Doppler.  Additional Comments: 3D imaging was not performed.   LEFT VENTRICLE PLAX 2D LVIDd:         3.56 cm     Diastology LVIDs:         2.22 cm     LV e' medial:    5.35 cm/s LV PW:         1.05 cm     LV E/e' medial:  11.9 LV IVS:        1.16 cm     LV e' lateral:   8.35 cm/s LVOT diam:     1.99 cm     LV E/e' lateral: 7.7 LV SV:         70 LV SV Index:   29 LVOT Area:     3.11 cm  LV Volumes (MOD) LV vol d,  MOD A2C: 66.8 ml LV vol d, MOD A4C: 73.6 ml LV vol s, MOD A2C: 26.1 ml LV vol s, MOD A4C: 27.7 ml LV SV MOD A2C:     40.7 ml LV SV MOD A4C:     73.6 ml LV SV MOD BP:      43.6 ml  RIGHT VENTRICLE RV S prime:     12.90 cm/s TAPSE (M-mode): 2.0 cm  LEFT ATRIUM             Index        RIGHT ATRIUM           Index LA diam:        2.59 cm 1.08 cm/m   RA Area:     12.00 cm LA  Vol [facility]):   28.7 ml 12.02 ml/m  RA Volume:   25.70 ml  10.76 ml/m LA Vol (A4C):   28.9 ml 12.10 ml/m LA Biplane Vol: 29.5 ml 12.35 ml/m AORTIC VALVE LVOT Vmax:   116.00 cm/s LVOT Vmean:  78.100 cm/s LVOT VTI:    0.225 m  AORTA Ao Root diam: 3.55 cm Ao Asc diam:  3.59 cm  MITRAL VALVE               TRICUSPID VALVE MV Area (PHT): 3.21 cm    TR Peak grad:   20.8 mmHg MV Decel Time: 236 msec    TR Vmax:        228.00 cm/s MV E velocity: 63.90 cm/s MV A velocity: 82.80 cm/s  SHUNTS MV E/A ratio:  0.77        Systemic VTI:  0.22 m Systemic Diam: 1.99 cm  Annabella Scarce MD Electronically signed by Annabella Scarce MD Signature Date/Time: 12/20/2023/10:00:34 AM    Final    MONITORS  CARDIAC EVENT MONITOR 11/03/2020  Narrative  Patient had a minimum heart rate of 59 bpm, maximum heart rate of 136 bpm, and average heart rate of 76 bpm.  Predominant underlying rhythm was sinus rhythm.  Isolated PACs were rare (<1.0%).  Isolated PVCs were rare (<1.0%).  No evidence of complete heart block or atrial fibrillation.  Triggered and diary events associated with sinus rhythm and sinus tachycardia.  No malignant arrhythmias.       ______________________________________________________________________________________________        Physical Exam:    VS:  BP (!) 120/58 (BP Location: Right Arm)   Pulse 91   Ht 6' (1.829 m)   Wt 262 lb 12.8 oz (119.2 kg)   SpO2 97%   BMI 35.64 kg/m    Wt Readings from Last 3 Encounters:  11/07/24 262 lb 12.8 oz (119.2 kg)  10/07/24 263 lb (119.3 kg)   09/02/24 266 lb (120.7 kg)    Gen: no distress  HEENT: R eye does not fully open Ears:  Dempsey Sign Cardiac: No Rubs or Gallops, no murmur, RRR +2 radial pulses Respiratory: Clear to auscultation bilaterally, normal effort, normal  respiratory rate GI: Soft, nontender, non-distended  MS: No  edema;  moves all extremities Integument: Skin feels warm Neuro:  At time of evaluation, alert and oriented to person/place/time/situation  Psych: Normal affect, patient feels well   ASSESSMENT AND PLAN: .    Mixed hyperlipidemia with statin intolerance Morbid obesity Cholesterol levels remain elevated despite current treatment. Statin intolerance due to muscle aches and pains. Previous adverse reactions to PCSK9 inhibitors, including urinary tract infections. Current treatment with Zetia  is insufficient to achieve cholesterol goals. Discussed two options: benpanoic acid (Nexlizet) and inclisiran. Benpanoic acid has a risk of tendon injury and requires monitoring of aortic status due to family history of aneurysm. Inclisiran is administered biannually with no reported side effects in the provider's experience. - Will initiate inclisiran pending prior authorization approval. - Continue current Zetia  until inclisiran is started. 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 Previous imaging in 2022 showed no significant aortic aneurysm. Discussed potential risks of benpanoic acid due to family history and mild aortic dilation. - If benpanoic acid is chosen, will order MRA to assess aortic status before initiation.  Trivial aortic regurgitation Noted on previous imaging. No current symptoms or clinical  changes warranting further imaging. - No immediate imaging required unless clinical symptoms develop.  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 No current symptoms or changes in heart block status.  Longitudinal care: The evaluation and management services provided today reflect the complexity inherent in caring for this patient, including the ongoing longitudinal relationship and management of multiple chronic conditions and/or the need for care coordination. The visit required a comprehensive assessment and management plan tailored to the patient's unique needs Time was spent addressing not only the acute concerns but also the broader context of the patient's health, including preventive care, chronic disease management, and care coordination as appropriate.  Complex longitudinal is necessary for conditions including: primary prevention    Stanly Leavens, MD FASE [facility] Cardiologist [facility]  [address] [place], [address] [place], [place] [address] [phone]  3:43 PM  "

## 2024-11-07 NOTE — Patient Instructions (Signed)
 Medication Instructions:  Your physician recommends that you continue on your current medications as directed. Please refer to the Current Medication list given to you today.   *If you need a refill on your cardiac medications before your next appointment, please call your pharmacy*  Lab Work: NONE    Testing/Procedures: NONE   Follow-Up: At [facility], you and your health needs are our priority.  As part of our continuing mission to provide you with exceptional heart care, our providers are all part of one team.  This team includes your primary Cardiologist (physician) and Advanced Practice Providers or APPs (Physician Assistants and Nurse Practitioners) who all work together to provide you with the care you need, when you need it.  Your next appointment:   1 year(s)  Provider:   Stanly DELENA Leavens, MD     Other Instructions

## 2024-11-24 ENCOUNTER — Ambulatory Visit: Admitting: Family Medicine

## 2024-11-26 NOTE — Telephone Encounter (Signed)
 Spoke to patient - will stop in to sign Leqvio start form some point tomorrow to get cost assessment on Leqvio

## 2024-12-16 ENCOUNTER — Ambulatory Visit: Admitting: Podiatry

## 2024-12-26 ENCOUNTER — Ambulatory Visit: Admitting: Family Medicine

## 2025-01-06 ENCOUNTER — Ambulatory Visit: Admitting: "Endocrinology

## 2025-01-30 ENCOUNTER — Ambulatory Visit

## 2025-02-03 ENCOUNTER — Ambulatory Visit
